# Patient Record
Sex: Female | Born: 1935 | Hispanic: No | Smoking: Never smoker
Health system: Southern US, Community
[De-identification: ages and names within clinical notes are randomized; demographics above are authoritative.]

## PROBLEM LIST (undated history)

## (undated) DIAGNOSIS — I6529 Occlusion and stenosis of unspecified carotid artery: Secondary | ICD-10-CM

## (undated) DIAGNOSIS — I480 Paroxysmal atrial fibrillation: Secondary | ICD-10-CM

## (undated) DIAGNOSIS — I499 Cardiac arrhythmia, unspecified: Secondary | ICD-10-CM

## (undated) DIAGNOSIS — K219 Gastro-esophageal reflux disease without esophagitis: Secondary | ICD-10-CM

## (undated) DIAGNOSIS — R51 Headache: Secondary | ICD-10-CM

## (undated) DIAGNOSIS — I10 Essential (primary) hypertension: Secondary | ICD-10-CM

## (undated) DIAGNOSIS — R519 Headache, unspecified: Secondary | ICD-10-CM

## (undated) DIAGNOSIS — M1990 Unspecified osteoarthritis, unspecified site: Secondary | ICD-10-CM

## (undated) HISTORY — PX: INNER EAR SURGERY: SHX679

## (undated) HISTORY — DX: Occlusion and stenosis of unspecified carotid artery: I65.29

## (undated) HISTORY — PX: NASAL SINUS SURGERY: SHX719

## (undated) HISTORY — PX: ABDOMINAL HYSTERECTOMY: SHX81

## (undated) HISTORY — PX: KNEE ARTHROPLASTY: SHX992

## (undated) HISTORY — PX: CAROTID ENDARTERECTOMY: SUR193

## (undated) HISTORY — PX: BACK SURGERY: SHX140

## (undated) HISTORY — PX: TONSILLECTOMY: SUR1361

## (undated) HISTORY — PX: APPENDECTOMY: SHX54

---

## 1999-02-22 ENCOUNTER — Other Ambulatory Visit: Admission: RE | Admit: 1999-02-22 | Discharge: 1999-02-22 | Payer: Self-pay | Admitting: Obstetrics & Gynecology

## 2004-05-24 ENCOUNTER — Other Ambulatory Visit: Admission: RE | Admit: 2004-05-24 | Discharge: 2004-05-24 | Payer: Self-pay | Admitting: Obstetrics & Gynecology

## 2013-10-29 ENCOUNTER — Other Ambulatory Visit (HOSPITAL_COMMUNITY): Payer: Self-pay | Admitting: Orthopedic Surgery

## 2013-10-29 DIAGNOSIS — M4800 Spinal stenosis, site unspecified: Secondary | ICD-10-CM

## 2013-11-02 ENCOUNTER — Ambulatory Visit (HOSPITAL_COMMUNITY)
Admission: RE | Admit: 2013-11-02 | Discharge: 2013-11-02 | Disposition: A | Discharge status: Home / Self Care | Payer: Medicare Other | Source: Ambulatory Visit | Attending: Orthopedic Surgery | Admitting: Orthopedic Surgery

## 2013-11-02 DIAGNOSIS — M4120 Other idiopathic scoliosis, site unspecified: Secondary | ICD-10-CM | POA: Diagnosis not present

## 2013-11-02 DIAGNOSIS — M545 Low back pain, unspecified: Secondary | ICD-10-CM | POA: Diagnosis present

## 2013-11-02 DIAGNOSIS — M47817 Spondylosis without myelopathy or radiculopathy, lumbosacral region: Secondary | ICD-10-CM | POA: Insufficient documentation

## 2013-11-02 DIAGNOSIS — M51379 Other intervertebral disc degeneration, lumbosacral region without mention of lumbar back pain or lower extremity pain: Secondary | ICD-10-CM | POA: Insufficient documentation

## 2013-11-02 DIAGNOSIS — M5137 Other intervertebral disc degeneration, lumbosacral region: Secondary | ICD-10-CM | POA: Diagnosis not present

## 2013-11-02 DIAGNOSIS — M5124 Other intervertebral disc displacement, thoracic region: Secondary | ICD-10-CM | POA: Diagnosis not present

## 2013-11-02 DIAGNOSIS — M4800 Spinal stenosis, site unspecified: Secondary | ICD-10-CM

## 2013-11-02 DIAGNOSIS — M7130 Other bursal cyst, unspecified site: Secondary | ICD-10-CM | POA: Diagnosis not present

## 2015-07-31 DIAGNOSIS — M7062 Trochanteric bursitis, left hip: Secondary | ICD-10-CM | POA: Diagnosis not present

## 2015-08-31 DIAGNOSIS — M1711 Unilateral primary osteoarthritis, right knee: Secondary | ICD-10-CM | POA: Diagnosis not present

## 2015-09-04 DIAGNOSIS — I10 Essential (primary) hypertension: Secondary | ICD-10-CM | POA: Diagnosis not present

## 2015-09-07 DIAGNOSIS — H2513 Age-related nuclear cataract, bilateral: Secondary | ICD-10-CM | POA: Diagnosis not present

## 2015-09-07 DIAGNOSIS — H538 Other visual disturbances: Secondary | ICD-10-CM | POA: Diagnosis not present

## 2015-09-07 DIAGNOSIS — H04123 Dry eye syndrome of bilateral lacrimal glands: Secondary | ICD-10-CM | POA: Diagnosis not present

## 2015-09-13 DIAGNOSIS — J069 Acute upper respiratory infection, unspecified: Secondary | ICD-10-CM | POA: Diagnosis not present

## 2015-09-13 DIAGNOSIS — M545 Low back pain: Secondary | ICD-10-CM | POA: Diagnosis not present

## 2015-09-13 DIAGNOSIS — Z6824 Body mass index (BMI) 24.0-24.9, adult: Secondary | ICD-10-CM | POA: Diagnosis not present

## 2015-09-13 DIAGNOSIS — I10 Essential (primary) hypertension: Secondary | ICD-10-CM | POA: Diagnosis not present

## 2015-09-13 DIAGNOSIS — Z789 Other specified health status: Secondary | ICD-10-CM | POA: Diagnosis not present

## 2015-09-19 DIAGNOSIS — I10 Essential (primary) hypertension: Secondary | ICD-10-CM | POA: Diagnosis not present

## 2015-09-19 DIAGNOSIS — E7800 Pure hypercholesterolemia, unspecified: Secondary | ICD-10-CM | POA: Diagnosis not present

## 2015-10-24 DIAGNOSIS — M7062 Trochanteric bursitis, left hip: Secondary | ICD-10-CM | POA: Diagnosis not present

## 2015-10-24 DIAGNOSIS — M47812 Spondylosis without myelopathy or radiculopathy, cervical region: Secondary | ICD-10-CM | POA: Diagnosis not present

## 2015-10-24 DIAGNOSIS — M25512 Pain in left shoulder: Secondary | ICD-10-CM | POA: Diagnosis not present

## 2015-10-24 DIAGNOSIS — M542 Cervicalgia: Secondary | ICD-10-CM | POA: Diagnosis not present

## 2015-10-31 DIAGNOSIS — Z1231 Encounter for screening mammogram for malignant neoplasm of breast: Secondary | ICD-10-CM | POA: Diagnosis not present

## 2015-11-16 DIAGNOSIS — Z299 Encounter for prophylactic measures, unspecified: Secondary | ICD-10-CM | POA: Diagnosis not present

## 2015-11-16 DIAGNOSIS — I10 Essential (primary) hypertension: Secondary | ICD-10-CM | POA: Diagnosis not present

## 2015-12-20 DIAGNOSIS — I868 Varicose veins of other specified sites: Secondary | ICD-10-CM | POA: Diagnosis not present

## 2016-01-03 DIAGNOSIS — M47812 Spondylosis without myelopathy or radiculopathy, cervical region: Secondary | ICD-10-CM | POA: Diagnosis not present

## 2016-01-19 DIAGNOSIS — M7062 Trochanteric bursitis, left hip: Secondary | ICD-10-CM | POA: Diagnosis not present

## 2016-03-01 DIAGNOSIS — Z23 Encounter for immunization: Secondary | ICD-10-CM | POA: Diagnosis not present

## 2016-03-22 DIAGNOSIS — E663 Overweight: Secondary | ICD-10-CM | POA: Diagnosis not present

## 2016-03-22 DIAGNOSIS — R5383 Other fatigue: Secondary | ICD-10-CM | POA: Diagnosis not present

## 2016-03-22 DIAGNOSIS — Z0000 Encounter for general adult medical examination without abnormal findings: Secondary | ICD-10-CM | POA: Diagnosis not present

## 2016-03-22 DIAGNOSIS — Z299 Encounter for prophylactic measures, unspecified: Secondary | ICD-10-CM | POA: Diagnosis not present

## 2016-03-22 DIAGNOSIS — Z1389 Encounter for screening for other disorder: Secondary | ICD-10-CM | POA: Diagnosis not present

## 2016-03-22 DIAGNOSIS — Z79899 Other long term (current) drug therapy: Secondary | ICD-10-CM | POA: Diagnosis not present

## 2016-03-22 DIAGNOSIS — Z7189 Other specified counseling: Secondary | ICD-10-CM | POA: Diagnosis not present

## 2016-03-25 ENCOUNTER — Ambulatory Visit (INDEPENDENT_AMBULATORY_CARE_PROVIDER_SITE_OTHER): Payer: Medicare Other | Admitting: Physical Medicine and Rehabilitation

## 2016-03-25 DIAGNOSIS — M545 Low back pain: Secondary | ICD-10-CM

## 2016-07-02 DIAGNOSIS — I10 Essential (primary) hypertension: Secondary | ICD-10-CM | POA: Diagnosis not present

## 2016-07-02 DIAGNOSIS — Z713 Dietary counseling and surveillance: Secondary | ICD-10-CM | POA: Diagnosis not present

## 2016-07-02 DIAGNOSIS — Z789 Other specified health status: Secondary | ICD-10-CM | POA: Diagnosis not present

## 2016-07-02 DIAGNOSIS — G43909 Migraine, unspecified, not intractable, without status migrainosus: Secondary | ICD-10-CM | POA: Diagnosis not present

## 2016-07-02 DIAGNOSIS — Z6826 Body mass index (BMI) 26.0-26.9, adult: Secondary | ICD-10-CM | POA: Diagnosis not present

## 2016-07-02 DIAGNOSIS — Z299 Encounter for prophylactic measures, unspecified: Secondary | ICD-10-CM | POA: Diagnosis not present

## 2016-07-08 DIAGNOSIS — D235 Other benign neoplasm of skin of trunk: Secondary | ICD-10-CM | POA: Diagnosis not present

## 2016-07-09 DIAGNOSIS — L308 Other specified dermatitis: Secondary | ICD-10-CM | POA: Diagnosis not present

## 2016-07-09 DIAGNOSIS — D225 Melanocytic nevi of trunk: Secondary | ICD-10-CM | POA: Diagnosis not present

## 2016-07-11 ENCOUNTER — Ambulatory Visit (INDEPENDENT_AMBULATORY_CARE_PROVIDER_SITE_OTHER): Payer: BC Managed Care – PPO | Admitting: Orthopedic Surgery

## 2016-07-13 ENCOUNTER — Encounter (INDEPENDENT_AMBULATORY_CARE_PROVIDER_SITE_OTHER): Payer: Self-pay | Admitting: Orthopedic Surgery

## 2016-07-13 ENCOUNTER — Ambulatory Visit (INDEPENDENT_AMBULATORY_CARE_PROVIDER_SITE_OTHER): Payer: Medicare Other | Admitting: Orthopedic Surgery

## 2016-07-13 ENCOUNTER — Ambulatory Visit (INDEPENDENT_AMBULATORY_CARE_PROVIDER_SITE_OTHER): Payer: Medicare Other

## 2016-07-13 DIAGNOSIS — M25561 Pain in right knee: Secondary | ICD-10-CM

## 2016-07-13 DIAGNOSIS — G8929 Other chronic pain: Secondary | ICD-10-CM

## 2016-07-13 DIAGNOSIS — M1711 Unilateral primary osteoarthritis, right knee: Secondary | ICD-10-CM | POA: Insufficient documentation

## 2016-07-13 MED ORDER — METHYLPREDNISOLONE ACETATE 40 MG/ML IJ SUSP
40.0000 mg | INTRAMUSCULAR | Status: AC | PRN
  Administered 2016-07-13: 40 mg via INTRA_ARTICULAR

## 2016-07-13 MED ORDER — LIDOCAINE HCL 1 % IJ SOLN
5.0000 mL | INTRAMUSCULAR | Status: AC | PRN
  Administered 2016-07-13: 5 mL

## 2016-07-13 NOTE — Progress Notes (Addendum)
Office Visit Note   Patient: Lydia Graham           Date of Birth: [birth year]           MRN: [id number] Visit Date: 07/13/2016              Requested by: Glenda Chroman, MD [address] [place], [place] [address] PCP: Glenda Chroman, MD  Chief Complaint  Patient presents with  . Right Knee - Pain    [id number] has chronic osteophyte arthritis involving upper and lower extremities worse in the right knee unable to perform activities of daily living due to pain. Injection last year did not provide much relief. HPI  Assessment & Plan: Visit Diagnoses:  1. Unilateral primary osteoarthritis, right knee   2. Chronic pain of right knee     Plan: Right knee injected through the anteromedial portal without complications. Patient will follow-up as needed. Discussed the risks and benefits of total knee arthroplasty patient will require discharge to skilled nursing postoperatively for several weeks.  Follow-Up Instructions: Return if symptoms worsen or fail to improve.   Ortho Exam Examination patient is alert oriented no adenopathy well-dressed normal affect normal rest where for she does have an antalgic gait she has valgus alignment to the right knee. She has pain to palpation or lateral joint line close a cruciate are stable she is crepitation with range of motion. Radiographs shows tricompartmental osteoarthritis of the right knee.  Imaging: Xr Knee 1-2 Views Right  Result Date: 07/13/2016 Two-view radiographs of the right knee shows bone-on-bone contact with lateral joint line osteophytic bone spurs as well as patellofemoral bone spurs subcondylar sclerosis the lateral joint line with increased valgus alignment of the right knee.   Orders:  Orders Placed This Encounter  Procedures  . Large Joint Injection/Arthrocentesis  . XR Knee 1-2 Views Right   Meds ordered this encounter  Medications  . lidocaine (XYLOCAINE) 1 % (with pres) injection 5 mL  . methylPREDNISolone acetate  (DEPO-MEDROL) injection 40 mg     Procedures: Large Joint Inj Date/Time: 07/13/2016 1:04 PM Performed by: DUDA, MARCUS V Authorized by: [name]   Consent Given by:  Patient Site marked: the procedure site was marked   Timeout: prior to procedure the correct patient, procedure, and site was verified   Indications:  Pain and diagnostic evaluation Location:  Knee Site:  R knee Prep: patient was prepped and draped in usual sterile fashion   Needle Size:  22 G Needle Length:  1.5 inches Ultrasound Guidance: No   Fluoroscopic Guidance: No   Arthrogram: No   Medications:  5 mL lidocaine 1 %; 40 mg methylPREDNISolone acetate 40 MG/ML Aspiration Attempted: No   Patient tolerance:  Patient tolerated the procedure well with no immediate complications    Clinical Data: No additional findings.  Subjective: Review of Systems  Objective: Vital Signs: There were no vitals taken for this visit.  Specialty Comments:  No specialty comments available.  PMFS History: Patient Active Problem List   Diagnosis Date Noted  . Unilateral primary osteoarthritis, right knee 07/13/2016   No past medical history on file.  No family history on file.  No past surgical history on file. Social History   Occupational History  . Not on file.   Social History Main Topics  . Smoking status: Never Smoker  . Smokeless tobacco: Never Used  . Alcohol use Not on file  . Drug use: Unknown  . Sexual activity: Not on  file

## 2016-07-17 ENCOUNTER — Telehealth (INDEPENDENT_AMBULATORY_CARE_PROVIDER_SITE_OTHER): Payer: Self-pay | Admitting: Orthopedic Surgery

## 2016-07-18 NOTE — Telephone Encounter (Signed)
Patient left a message requesting an appointment with Dr. Sharol Given. Routed message to phones/ front desk to call and schedule the patient.

## 2016-09-04 ENCOUNTER — Ambulatory Visit (INDEPENDENT_AMBULATORY_CARE_PROVIDER_SITE_OTHER): Payer: BC Managed Care – PPO | Admitting: Orthopedic Surgery

## 2016-09-10 ENCOUNTER — Ambulatory Visit (INDEPENDENT_AMBULATORY_CARE_PROVIDER_SITE_OTHER): Payer: Medicare Other | Admitting: Orthopedic Surgery

## 2016-09-10 ENCOUNTER — Telehealth (INDEPENDENT_AMBULATORY_CARE_PROVIDER_SITE_OTHER): Payer: Self-pay | Admitting: Physical Medicine and Rehabilitation

## 2016-09-10 ENCOUNTER — Encounter (INDEPENDENT_AMBULATORY_CARE_PROVIDER_SITE_OTHER): Payer: Self-pay | Admitting: Orthopedic Surgery

## 2016-09-10 ENCOUNTER — Encounter (INDEPENDENT_AMBULATORY_CARE_PROVIDER_SITE_OTHER): Payer: Self-pay

## 2016-09-10 VITALS — Ht 67.0 in | Wt 150.0 lb

## 2016-09-10 DIAGNOSIS — M1711 Unilateral primary osteoarthritis, right knee: Secondary | ICD-10-CM | POA: Diagnosis not present

## 2016-09-10 NOTE — Telephone Encounter (Signed)
If she feels vary same symptoms etc no new trauma and last was more than 50%

## 2016-09-10 NOTE — Progress Notes (Signed)
   Office Visit Note   Patient: Lydia Graham           Date of Birth: Oct 18, 1935           MRN: [id number] Visit Date: 09/10/2016              Requested by: Glenda Chroman, MD [address] [place], [place] [address] PCP: Glenda Chroman, MD  Chief Complaint  Patient presents with  . Right Knee - Pain    s/p injection 07/13/16    [id number] complains of right knee pain which is now not relieved with injections. She is undergone prolonged conservative therapy she has pain with activities of daily living pain at night pain that prevents her from performing her normal activities. HPI  Assessment & Plan: Visit Diagnoses:  1. Unilateral primary osteoarthritis, right knee     Plan: Patient states she would proceed with a total knee arthroplasty. Risk and benefits were discussed including infection neurovascular injury persistent pain and need for additional surgery. Patient states she understands wish to proceed at this time she would like to proceed towards the end of April beginning of May when her sister who is a Buyer, retail could be available to assist her at home.  Follow-Up Instructions: Return if symptoms worsen or fail to improve.   Ortho Exam Examination patient is alert oriented no adenopathy well-dressed normal affect normal respiratory effort she does have an antalgic gait she has valgus alignment to the right knee causing presents are stable she is tender to palpation over the medial joint line as well as patellofemoral joint there is no effusion. Range of motion 0-120. ROS: Review of systems updated all other review of systems negative. Imaging: No results found.  Labs: No results found for: HGBA1C, ESRSEDRATE, CRP, LABURIC, REPTSTATUS, GRAMSTAIN, CULT, LABORGA  Orders:  No orders of the defined types were placed in this encounter.  No orders of the defined types were placed in this encounter.    Procedures: No procedures performed  Clinical Data: No  additional findings.  Subjective: Review of Systems  Objective: Vital Signs: Ht 5\' 7"  (1.702 m)   Wt 150 lb (68 kg)   BMI 23.49 kg/m   Specialty Comments:  No specialty comments available.  PMFS History: Patient Active Problem List   Diagnosis Date Noted  . Unilateral primary osteoarthritis, right knee 07/13/2016   No past medical history on file.  No family history on file.  No past surgical history on file. Social History   Occupational History  . Not on file.   Social History Main Topics  . Smoking status: Never Smoker  . Smokeless tobacco: Never Used  . Alcohol use Not on file  . Drug use: Unknown  . Sexual activity: Not on file

## 2016-09-10 NOTE — Telephone Encounter (Signed)
Called and left message for patient to call for scheduling.

## 2016-09-11 DIAGNOSIS — E663 Overweight: Secondary | ICD-10-CM | POA: Diagnosis not present

## 2016-09-11 DIAGNOSIS — M5440 Lumbago with sciatica, unspecified side: Secondary | ICD-10-CM | POA: Diagnosis not present

## 2016-09-11 DIAGNOSIS — Z713 Dietary counseling and surveillance: Secondary | ICD-10-CM | POA: Diagnosis not present

## 2016-09-11 DIAGNOSIS — Z299 Encounter for prophylactic measures, unspecified: Secondary | ICD-10-CM | POA: Diagnosis not present

## 2016-09-11 DIAGNOSIS — M545 Low back pain: Secondary | ICD-10-CM | POA: Diagnosis not present

## 2016-09-11 NOTE — Telephone Encounter (Signed)
I called and left another message for the patient to call for scheduling.

## 2016-09-11 NOTE — Telephone Encounter (Signed)
The patient called back and said she saw her regular doctor yesterday who put her on oral prednisone and "gave her a shot in the sciatic nerve." She said she is still hurting, and the injection she had here in October helped. Please advise.

## 2016-09-11 NOTE — Telephone Encounter (Signed)
We need to wait 10 days after the "sciatic nerve" injection but can repeat last

## 2016-09-12 NOTE — Telephone Encounter (Signed)
Scheduled for 09/25/16 at 0830.

## 2016-09-25 ENCOUNTER — Ambulatory Visit (INDEPENDENT_AMBULATORY_CARE_PROVIDER_SITE_OTHER): Payer: Medicare Other

## 2016-09-25 ENCOUNTER — Ambulatory Visit (INDEPENDENT_AMBULATORY_CARE_PROVIDER_SITE_OTHER): Payer: Medicare Other | Admitting: Physical Medicine and Rehabilitation

## 2016-09-25 ENCOUNTER — Encounter (INDEPENDENT_AMBULATORY_CARE_PROVIDER_SITE_OTHER): Payer: Self-pay

## 2016-09-25 ENCOUNTER — Other Ambulatory Visit (INDEPENDENT_AMBULATORY_CARE_PROVIDER_SITE_OTHER): Payer: Self-pay | Admitting: Family

## 2016-09-25 ENCOUNTER — Encounter (INDEPENDENT_AMBULATORY_CARE_PROVIDER_SITE_OTHER): Payer: Self-pay | Admitting: Physical Medicine and Rehabilitation

## 2016-09-25 VITALS — BP 136/73 | HR 61 | Temp 98.0°F

## 2016-09-25 DIAGNOSIS — G8929 Other chronic pain: Secondary | ICD-10-CM

## 2016-09-25 DIAGNOSIS — M5442 Lumbago with sciatica, left side: Secondary | ICD-10-CM

## 2016-09-25 DIAGNOSIS — M5416 Radiculopathy, lumbar region: Secondary | ICD-10-CM

## 2016-09-25 DIAGNOSIS — M7062 Trochanteric bursitis, left hip: Secondary | ICD-10-CM

## 2016-09-25 DIAGNOSIS — M25552 Pain in left hip: Secondary | ICD-10-CM

## 2016-09-25 NOTE — Progress Notes (Signed)
Lydia Graham - 81 y.o. female MRN [id number]  Date of birth: 06/24/1936  Office Visit Note: Visit Date: 09/25/2016 PCP: Glenda Chroman, MD Referred by: Glenda Chroman, MD  Subjective: Chief Complaint  Patient presents with  . Lower Back - Pain   HPI: Lydia Graham is a very pleasant 81 year old female that I have seen off and on for several years. She has a lumbar MRI showing levoconvex lumbar scoliosis with listhesis of L3 on L4 and retrolisthesis of L4 on L5 but mild areas of possible impingement at L3-for L4-5 both left and right. No frank nerve compression noted on imaging. She comes in today complaining of chronic worsening lower back pain. Pain was mostly on the left side two weeks ago, now having a little pain on right side. Has to lie flat for relief. Riding in car causes buttock pain. Had an injection for this at PCP's office recently, also took prednisone. This has relieved the pain some, and she is no longer having pain in the leg. Pain level now is at a 3 or 4. Pain was "off the chart" before seeing PCP. She evidently got a shot of steroid medication and may be Toradol and her primary care physician's office. Her main complaint today really is pain over the left greater trochanter and not being able to lay on that side. Her back pain is fairly mild at this point. She is also going to have a knee replacement performed in the early part of May.     ROS Otherwise per HPI.  Assessment & Plan: Visit Diagnoses:  1. Pain in left hip   2. Greater trochanteric bursitis, left   3. Lumbar radiculopathy   4. Chronic bilateral low back pain with left-sided sciatica     Plan: Findings:  Diagnostic and therapeutic left greater trochanteric bursa injection with fluoroscopic guidance due to body habitus. If she gets no relief from this we would try to maybe look at epidural injection before her surgery. Dr. Sharol Given did not want her to have any steroid injections 2 weeks prior to that surgery.    Meds & Orders: No orders of the defined types were placed in this encounter.   Orders Placed This Encounter  Procedures  . Large Joint Injection/Arthrocentesis  . XR C-ARM NO REPORT    Follow-up: Return if symptoms worsen or fail to improve.   Procedures: Date/Time: 09/25/2016 8:47 AM Performed by: Magnus Sinning Authorized by: Magnus Sinning   Consent Given by:  Parent Site marked: the procedure site was marked   Timeout: prior to procedure the correct patient, procedure, and site was verified   Indications:  Pain and diagnostic evaluation Location:  Hip Site:  L greater trochanter Prep: patient was prepped and draped in usual sterile fashion   Needle Size:  22 G Needle Length:  3.5 inches Approach:  Lateral Ultrasound Guidance: No   Fluoroscopic Guidance: Yes   Arthrogram: No   Medications:  80 mg triamcinolone acetonide 40 MG/ML; 9 mL bupivacaine 0.5 % Aspiration Attempted: No   Patient tolerance:  Patient tolerated the procedure well with no immediate complications  There was excellent flow of contrast outlined the greater trochanteric bursa without vascular uptake.    No notes on file   Clinical History: FINDINGS: There is 13 degrees of levoconvex lumbar scoliosis between L5 and L2. Lipoma of the a left lateral abdominal wall musculature incidentally noted.  The lowest lumbar type non-rib-bearing vertebra is labeled as L5. The conus medullaris  appears normal. Conus level: L1-2. Suspected postoperative findings on the right at L3-4.  There is disc desiccation throughout the lumbar spine. Type 1 degenerative endplate findings at V8-9 with type 2 degenerative endplate findings at F8-1.  There is 3 mm of grade 1 anterolisthesis at L3-4 and 3 mm retrolisthesis at L4-5.  The leftward scoliosis has a significant rotary component.  Small central disc protrusion at T12-L1 without impingement. Additional findings at individual levels are as  follows:  L1-2: No impingement. Left lateral recess and inferior foraminal disc protrusion.  L2-3: Mild displacement of the left L2 nerve in the lateral extraforaminal space due to left lateral extraforaminal disc protrusion.  L3-4: Moderate right foraminal stenosis and mild right subarticular lateral recess stenosis due to disc uncovering, right lateral recess and foraminal disc protrusion, and facet arthropathy. There is also a left paracentral disc protrusion.  L4-5: Mild left foraminal stenosis along with mild left and borderline right subarticular lateral recess stenosis due to disc bulge and facet and intervertebral spurring.  L5-S1: No impingement. Small bilateral synovial cysts are best observed on the axial images.  IMPRESSION: 1. Lumbar spondylosis and degenerative disc disease, causing moderate impingement at L3-4 and mild impingement at L2-3 and L4-5, as detailed above. 2. Levoconvex lumbar scoliosis with rotary component.   Electronically Signed   By: Sherryl Barters M.D.   On: 11/02/2013 11:29  She reports that she has never smoked. She has never used smokeless tobacco. No results for input(s): HGBA1C, LABURIC in the last 8760 hours.  Objective:  VS:  HT:    WT:   BMI:     BP:136/73  HR:61bpm  TEMP:98 F (36.7 C)(Oral)  RESP:99 % Physical Exam  Musculoskeletal:  Patient has concordant low back pain with extension rotation she has no pain with hip rotation except at the end range of external rotation on the left. She has pain over the left greater trochanter that reproduces most of her hip pain.    Ortho Exam Imaging: Xr C-arm No Report  Result Date: 09/25/2016 Please see Notes or Procedures tab for imaging impression.   Past Medical/Family/Surgical/Social History: Medications & Allergies reviewed per EMR Patient Active Problem List   Diagnosis Date Noted  . Unilateral primary osteoarthritis, right knee 07/13/2016   History reviewed. No  pertinent past medical history. History reviewed. No pertinent family history. History reviewed. No pertinent surgical history. Social History   Occupational History  . Not on file.   Social History Main Topics  . Smoking status: Never Smoker  . Smokeless tobacco: Never Used  . Alcohol use Not on file  . Drug use: Unknown  . Sexual activity: Not on file

## 2016-09-25 NOTE — Patient Instructions (Signed)

## 2016-09-26 MED ORDER — BUPIVACAINE HCL 0.5 % IJ SOLN
9.0000 mL | INTRAMUSCULAR | Status: AC | PRN
  Administered 2016-09-25: 9 mL via INTRA_ARTICULAR

## 2016-09-26 MED ORDER — TRIAMCINOLONE ACETONIDE 40 MG/ML IJ SUSP
80.0000 mg | INTRAMUSCULAR | Status: AC | PRN
  Administered 2016-09-25: 80 mg via INTRA_ARTICULAR

## 2016-10-07 ENCOUNTER — Other Ambulatory Visit (INDEPENDENT_AMBULATORY_CARE_PROVIDER_SITE_OTHER): Payer: Self-pay | Admitting: Orthopedic Surgery

## 2016-10-14 ENCOUNTER — Encounter (HOSPITAL_COMMUNITY)
Admission: RE | Admit: 2016-10-14 | Discharge: 2016-10-14 | Disposition: A | Discharge status: Home / Self Care | Payer: Medicare Other | Source: Ambulatory Visit | Attending: Orthopedic Surgery | Admitting: Orthopedic Surgery

## 2016-10-14 ENCOUNTER — Encounter (HOSPITAL_COMMUNITY): Payer: Self-pay | Admitting: *Deleted

## 2016-10-14 DIAGNOSIS — M1711 Unilateral primary osteoarthritis, right knee: Secondary | ICD-10-CM | POA: Insufficient documentation

## 2016-10-14 DIAGNOSIS — Z01812 Encounter for preprocedural laboratory examination: Secondary | ICD-10-CM | POA: Insufficient documentation

## 2016-10-14 DIAGNOSIS — Z01810 Encounter for preprocedural cardiovascular examination: Secondary | ICD-10-CM | POA: Insufficient documentation

## 2016-10-14 DIAGNOSIS — I10 Essential (primary) hypertension: Secondary | ICD-10-CM | POA: Diagnosis not present

## 2016-10-14 HISTORY — DX: Headache, unspecified: R51.9

## 2016-10-14 HISTORY — DX: Gastro-esophageal reflux disease without esophagitis: K21.9

## 2016-10-14 HISTORY — DX: Essential (primary) hypertension: I10

## 2016-10-14 HISTORY — DX: Unspecified osteoarthritis, unspecified site: M19.90

## 2016-10-14 HISTORY — DX: Headache: R51

## 2016-10-14 LAB — BASIC METABOLIC PANEL
ANION GAP: 7 (ref 5–15)
BUN: 14 mg/dL (ref 6–20)
CALCIUM: 9.2 mg/dL (ref 8.9–10.3)
CO2: 26 mmol/L (ref 22–32)
Chloride: 94 mmol/L — ABNORMAL LOW (ref 101–111)
Creatinine, Ser: 0.98 mg/dL (ref 0.44–1.00)
GFR, EST NON AFRICAN AMERICAN: 54 mL/min — AB (ref >60)
Glucose, Bld: 97 mg/dL (ref 65–99)
Potassium: 4.3 mmol/L (ref 3.5–5.1)
Sodium: 127 mmol/L — ABNORMAL LOW (ref 135–145)

## 2016-10-14 LAB — CBC
HEMATOCRIT: 40.1 % (ref 36.0–46.0)
Hemoglobin: 13.6 g/dL (ref 12.0–15.0)
MCH: 29.8 pg (ref 26.0–34.0)
MCHC: 33.9 g/dL (ref 30.0–36.0)
MCV: 87.9 fL (ref 78.0–100.0)
Platelets: 219 10*3/uL (ref 150–400)
RBC: 4.56 MIL/uL (ref 3.87–5.11)
RDW: 12.8 % (ref 11.5–15.5)
WBC: 7.1 10*3/uL (ref 4.0–10.5)

## 2016-10-14 LAB — SURGICAL PCR SCREEN
MRSA, PCR: NEGATIVE
Staphylococcus aureus: NEGATIVE

## 2016-10-14 NOTE — Pre-Procedure Instructions (Signed)
    [place]  10/14/2016      [facility] - [place], [place], [place] [address] [place] [address] Phone: [phone] Fax: [phone]    Your procedure is scheduled on 10/23/16.  Report to [facility] Admitting at 630 A.M.  Call this number if you have problems the morning of surgery:  [phone]   Remember:  Do not eat food or drink liquids after midnight.  Take these medicines the morning of surgery with A SIP OF WATER --norvasc,carvedilol,estrace,protonix   Do not wear jewelry, make-up or nail polish.  Do not wear lotions, powders, or perfumes, or deoderant.  Do not shave 48 hours prior to surgery.  Men may shave face and neck.  Do not bring valuables to the hospital.  [facility] is not responsible for any belongings or valuables.  Contacts, dentures or bridgework may not be worn into surgery.  Leave your suitcase in the car.  After surgery it may be brought to your room.  For patients admitted to the hospital, discharge time will be determined by your treatment team.  Patients discharged the day of surgery will not be allowed to drive home.   Name and phone number of your driver:   Special instructions:  Do not take any aspirin,anti-inflammatories,vitamins,or herbal supplements 5-7 days prior to surgery.  Please read over the following fact sheets that you were given. MRSA Information

## 2016-10-22 MED ORDER — CEFAZOLIN SODIUM-DEXTROSE 2-4 GM/100ML-% IV SOLN
2.0000 g | INTRAVENOUS | Status: AC
  Administered 2016-10-23: 2 g via INTRAVENOUS
  Filled 2016-10-22: qty 100

## 2016-10-22 NOTE — Anesthesia Preprocedure Evaluation (Addendum)
Anesthesia Evaluation  Patient identified by MRN, date of birth, ID band Patient awake    Reviewed: Allergy & Precautions, NPO status , Patient's Chart, lab work & pertinent test results, reviewed documented beta blocker date and time   History of Anesthesia Complications Negative for: history of anesthetic complications  Airway Mallampati: II  TM Distance: >3 FB Neck ROM: Full    Dental no notable dental hx. (+) Dental Advisory Given, Teeth Intact   Pulmonary neg pulmonary ROS,    Pulmonary exam normal        Cardiovascular hypertension, Pt. on home beta blockers and Pt. on medications negative cardio ROS Normal cardiovascular exam     Neuro/Psych negative neurological ROS     GI/Hepatic Neg liver ROS, GERD  Medicated and Controlled,  Endo/Other  negative endocrine ROS  Renal/GU negative Renal ROS     Musculoskeletal  (+) Arthritis , Osteoarthritis,    Abdominal   Peds  Hematology negative hematology ROS (+)   Anesthesia Other Findings Day of surgery medications reviewed with the patient.  Reproductive/Obstetrics                           Anesthesia Physical Anesthesia Plan  ASA: II  Anesthesia Plan: MAC and Spinal   Post-op Pain Management: GA combined w/ Regional for post-op pain   Induction:   Airway Management Planned: Natural Airway and Simple Face Mask  Additional Equipment:   Intra-op Plan:   Post-operative Plan:   Informed Consent: I have reviewed the patients History and Physical, chart, labs and discussed the procedure including the risks, benefits and alternatives for the proposed anesthesia with the patient or authorized representative who has indicated his/her understanding and acceptance.   Dental advisory given  Plan Discussed with: Anesthesiologist, CRNA and Surgeon  Anesthesia Plan Comments:       Anesthesia Quick Evaluation

## 2016-10-23 ENCOUNTER — Inpatient Hospital Stay (HOSPITAL_COMMUNITY): Payer: Medicare Other | Admitting: Anesthesiology

## 2016-10-23 ENCOUNTER — Encounter (HOSPITAL_COMMUNITY): Payer: Self-pay | Admitting: Certified Registered Nurse Anesthetist

## 2016-10-23 ENCOUNTER — Encounter (HOSPITAL_COMMUNITY)
Admission: RE | Disposition: A | Discharge status: Home Health | Payer: Self-pay | Source: Ambulatory Visit | Attending: Orthopedic Surgery

## 2016-10-23 ENCOUNTER — Inpatient Hospital Stay (HOSPITAL_COMMUNITY)
Admission: RE | Admit: 2016-10-23 | Discharge: 2016-10-25 | DRG: 470 | Disposition: A | Discharge status: Home Health | Payer: Medicare Other | Source: Ambulatory Visit | Attending: Orthopedic Surgery | Admitting: Orthopedic Surgery

## 2016-10-23 DIAGNOSIS — Z79890 Hormone replacement therapy (postmenopausal): Secondary | ICD-10-CM | POA: Diagnosis not present

## 2016-10-23 DIAGNOSIS — R51 Headache: Secondary | ICD-10-CM | POA: Diagnosis present

## 2016-10-23 DIAGNOSIS — Z96651 Presence of right artificial knee joint: Secondary | ICD-10-CM

## 2016-10-23 DIAGNOSIS — M1711 Unilateral primary osteoarthritis, right knee: Secondary | ICD-10-CM | POA: Diagnosis not present

## 2016-10-23 DIAGNOSIS — Z791 Long term (current) use of non-steroidal anti-inflammatories (NSAID): Secondary | ICD-10-CM | POA: Diagnosis not present

## 2016-10-23 DIAGNOSIS — Z79899 Other long term (current) drug therapy: Secondary | ICD-10-CM

## 2016-10-23 DIAGNOSIS — Z888 Allergy status to other drugs, medicaments and biological substances status: Secondary | ICD-10-CM

## 2016-10-23 DIAGNOSIS — G8918 Other acute postprocedural pain: Secondary | ICD-10-CM | POA: Diagnosis not present

## 2016-10-23 DIAGNOSIS — K219 Gastro-esophageal reflux disease without esophagitis: Secondary | ICD-10-CM | POA: Diagnosis present

## 2016-10-23 DIAGNOSIS — I10 Essential (primary) hypertension: Secondary | ICD-10-CM | POA: Diagnosis present

## 2016-10-23 HISTORY — PX: TOTAL KNEE ARTHROPLASTY: SHX125

## 2016-10-23 SURGERY — ARTHROPLASTY, KNEE, TOTAL
Anesthesia: Monitor Anesthesia Care | Laterality: Right

## 2016-10-23 MED ORDER — TRANEXAMIC ACID 1000 MG/10ML IV SOLN
1000.0000 mg | INTRAVENOUS | Status: AC
  Administered 2016-10-23: 1000 mg via INTRAVENOUS
  Filled 2016-10-23: qty 10

## 2016-10-23 MED ORDER — MIDAZOLAM HCL 2 MG/2ML IJ SOLN
INTRAMUSCULAR | Status: AC
  Filled 2016-10-23: qty 2

## 2016-10-23 MED ORDER — LOSARTAN POTASSIUM 50 MG PO TABS
100.0000 mg | ORAL_TABLET | Freq: Every day | ORAL | Status: DC
  Administered 2016-10-23 – 2016-10-25 (×3): 100 mg via ORAL
  Filled 2016-10-23 (×3): qty 2

## 2016-10-23 MED ORDER — DOCUSATE SODIUM 100 MG PO CAPS
100.0000 mg | ORAL_CAPSULE | Freq: Two times a day (BID) | ORAL | Status: DC
  Administered 2016-10-23 – 2016-10-25 (×5): 100 mg via ORAL
  Filled 2016-10-23 (×5): qty 1

## 2016-10-23 MED ORDER — CEFAZOLIN SODIUM-DEXTROSE 1-4 GM/50ML-% IV SOLN
1.0000 g | Freq: Four times a day (QID) | INTRAVENOUS | Status: AC
  Administered 2016-10-23 (×2): 1 g via INTRAVENOUS
  Filled 2016-10-23 (×3): qty 50

## 2016-10-23 MED ORDER — TRANEXAMIC ACID 1000 MG/10ML IV SOLN
INTRAVENOUS | Status: DC | PRN
  Administered 2016-10-23: 2000 mg via TOPICAL

## 2016-10-23 MED ORDER — PROPOFOL 500 MG/50ML IV EMUL
INTRAVENOUS | Status: DC | PRN
  Administered 2016-10-23: 50 ug/kg/min via INTRAVENOUS

## 2016-10-23 MED ORDER — METOCLOPRAMIDE HCL 5 MG/ML IJ SOLN
5.0000 mg | Freq: Three times a day (TID) | INTRAMUSCULAR | Status: DC | PRN
Start: 2016-10-23 — End: 2016-10-25

## 2016-10-23 MED ORDER — ONDANSETRON HCL 4 MG/2ML IJ SOLN
INTRAMUSCULAR | Status: AC
  Filled 2016-10-23: qty 2

## 2016-10-23 MED ORDER — PROPOFOL 10 MG/ML IV BOLUS
INTRAVENOUS | Status: AC
  Filled 2016-10-23: qty 20

## 2016-10-23 MED ORDER — SUCCINYLCHOLINE CHLORIDE 200 MG/10ML IV SOSY
PREFILLED_SYRINGE | INTRAVENOUS | Status: AC
  Filled 2016-10-23: qty 10

## 2016-10-23 MED ORDER — FENTANYL CITRATE (PF) 250 MCG/5ML IJ SOLN
INTRAMUSCULAR | Status: AC
  Filled 2016-10-23: qty 5

## 2016-10-23 MED ORDER — ONDANSETRON HCL 4 MG PO TABS: 4.0000 mg | ORAL_TABLET | Freq: Four times a day (QID) | ORAL | Status: DC | PRN

## 2016-10-23 MED ORDER — ASPIRIN EC 325 MG PO TBEC
325.0000 mg | DELAYED_RELEASE_TABLET | Freq: Every day | ORAL | Status: DC
  Administered 2016-10-24 – 2016-10-25 (×2): 325 mg via ORAL
  Filled 2016-10-23 (×2): qty 1

## 2016-10-23 MED ORDER — HYDROMORPHONE HCL 1 MG/ML IJ SOLN
1.0000 mg | INTRAMUSCULAR | Status: DC | PRN
  Administered 2016-10-23 – 2016-10-24 (×6): 1 mg via INTRAVENOUS
  Filled 2016-10-23 (×6): qty 1

## 2016-10-23 MED ORDER — PANTOPRAZOLE SODIUM 40 MG PO TBEC
40.0000 mg | DELAYED_RELEASE_TABLET | Freq: Every day | ORAL | Status: DC
  Administered 2016-10-24 – 2016-10-25 (×2): 40 mg via ORAL
  Filled 2016-10-23 (×2): qty 1

## 2016-10-23 MED ORDER — POLYETHYLENE GLYCOL 3350 17 G PO PACK: 17.0000 g | PACK | Freq: Every day | ORAL | Status: DC | PRN

## 2016-10-23 MED ORDER — MIDAZOLAM HCL 5 MG/5ML IJ SOLN
INTRAMUSCULAR | Status: DC | PRN
  Administered 2016-10-23: 2 mg via INTRAVENOUS

## 2016-10-23 MED ORDER — ESTRADIOL 1 MG PO TABS
0.5000 mg | ORAL_TABLET | Freq: Every day | ORAL | Status: DC
  Administered 2016-10-23 – 2016-10-25 (×3): 0.5 mg via ORAL
  Filled 2016-10-23 (×3): qty 0.5

## 2016-10-23 MED ORDER — AMLODIPINE BESYLATE 10 MG PO TABS
10.0000 mg | ORAL_TABLET | Freq: Every day | ORAL | Status: DC
  Administered 2016-10-23 – 2016-10-25 (×3): 10 mg via ORAL
  Filled 2016-10-23 (×3): qty 1

## 2016-10-23 MED ORDER — PROMETHAZINE HCL 25 MG/ML IJ SOLN: 6.2500 mg | INTRAMUSCULAR | Status: DC | PRN

## 2016-10-23 MED ORDER — EPHEDRINE 5 MG/ML INJ
INTRAVENOUS | Status: AC
  Filled 2016-10-23: qty 10

## 2016-10-23 MED ORDER — PHENYLEPHRINE HCL 10 MG/ML IJ SOLN
INTRAVENOUS | Status: DC | PRN
  Administered 2016-10-23: 20 ug/min via INTRAVENOUS

## 2016-10-23 MED ORDER — ACETAMINOPHEN 650 MG RE SUPP: 650.0000 mg | Freq: Four times a day (QID) | RECTAL | Status: DC | PRN

## 2016-10-23 MED ORDER — SODIUM CHLORIDE 0.9 % IR SOLN
Status: DC | PRN
  Administered 2016-10-23: 3000 mL

## 2016-10-23 MED ORDER — ONDANSETRON HCL 4 MG/2ML IJ SOLN
4.0000 mg | Freq: Four times a day (QID) | INTRAMUSCULAR | Status: DC | PRN
  Administered 2016-10-23 – 2016-10-24 (×2): 4 mg via INTRAVENOUS
  Filled 2016-10-23 (×2): qty 2

## 2016-10-23 MED ORDER — FENTANYL CITRATE (PF) 100 MCG/2ML IJ SOLN
INTRAMUSCULAR | Status: DC | PRN
Start: 2016-10-23 — End: 2016-10-23
  Administered 2016-10-23: 50 ug via INTRAVENOUS

## 2016-10-23 MED ORDER — SODIUM CHLORIDE 0.9 % IV SOLN
INTRAVENOUS | Status: DC
  Administered 2016-10-23: 13:00:00 via INTRAVENOUS

## 2016-10-23 MED ORDER — METHOCARBAMOL 500 MG PO TABS
500.0000 mg | ORAL_TABLET | Freq: Four times a day (QID) | ORAL | Status: DC | PRN
  Administered 2016-10-24 – 2016-10-25 (×2): 500 mg via ORAL
  Filled 2016-10-23 (×2): qty 1

## 2016-10-23 MED ORDER — METOCLOPRAMIDE HCL 5 MG PO TABS
5.0000 mg | ORAL_TABLET | Freq: Three times a day (TID) | ORAL | Status: DC | PRN
Start: 2016-10-23 — End: 2016-10-25

## 2016-10-23 MED ORDER — LACTATED RINGERS IV SOLN
INTRAVENOUS | Status: DC | PRN
  Administered 2016-10-23: 08:00:00 via INTRAVENOUS

## 2016-10-23 MED ORDER — BISACODYL 10 MG RE SUPP: 10.0000 mg | Freq: Every day | RECTAL | Status: DC | PRN

## 2016-10-23 MED ORDER — MENTHOL 3 MG MT LOZG: 1.0000 | LOZENGE | OROMUCOSAL | Status: DC | PRN

## 2016-10-23 MED ORDER — HYDROMORPHONE HCL 1 MG/ML IJ SOLN: 0.2500 mg | INTRAMUSCULAR | Status: DC | PRN

## 2016-10-23 MED ORDER — OXYCODONE HCL 5 MG PO TABS
5.0000 mg | ORAL_TABLET | ORAL | Status: DC | PRN
  Administered 2016-10-23 (×2): 10 mg via ORAL
  Administered 2016-10-24 (×2): 5 mg via ORAL
  Administered 2016-10-24 – 2016-10-25 (×4): 10 mg via ORAL
  Filled 2016-10-23: qty 1
  Filled 2016-10-23 (×7): qty 2
  Filled 2016-10-23: qty 1

## 2016-10-23 MED ORDER — PHENOL 1.4 % MT LIQD
1.0000 | OROMUCOSAL | Status: DC | PRN
Start: 2016-10-23 — End: 2016-10-25

## 2016-10-23 MED ORDER — TRANEXAMIC ACID 1000 MG/10ML IV SOLN
2000.0000 mg | INTRAVENOUS | Status: DC
  Filled 2016-10-23: qty 20

## 2016-10-23 MED ORDER — METHOCARBAMOL 1000 MG/10ML IJ SOLN
500.0000 mg | Freq: Four times a day (QID) | INTRAVENOUS | Status: DC | PRN
  Filled 2016-10-23: qty 5

## 2016-10-23 MED ORDER — BUPIVACAINE LIPOSOME 1.3 % IJ SUSP
20.0000 mL | INTRAMUSCULAR | Status: DC
Start: 2016-10-23 — End: 2016-10-23
  Filled 2016-10-23: qty 20

## 2016-10-23 MED ORDER — 0.9 % SODIUM CHLORIDE (POUR BTL) OPTIME
TOPICAL | Status: DC | PRN
  Administered 2016-10-23: 1000 mL

## 2016-10-23 MED ORDER — CARVEDILOL 25 MG PO TABS
25.0000 mg | ORAL_TABLET | Freq: Two times a day (BID) | ORAL | Status: DC
  Administered 2016-10-23 – 2016-10-25 (×4): 25 mg via ORAL
  Filled 2016-10-23 (×4): qty 1

## 2016-10-23 MED ORDER — ACETAMINOPHEN 325 MG PO TABS
650.0000 mg | ORAL_TABLET | Freq: Four times a day (QID) | ORAL | Status: DC | PRN
  Administered 2016-10-24: 650 mg via ORAL
  Filled 2016-10-23: qty 2

## 2016-10-23 MED ORDER — ROPIVACAINE HCL 5 MG/ML IJ SOLN
INTRAMUSCULAR | Status: DC | PRN
  Administered 2016-10-23: 150 mg via PERINEURAL

## 2016-10-23 SURGICAL SUPPLY — 53 items
BLADE SAGITTAL 25.0X1.19X90 (BLADE) ×2 IMPLANT
BLADE SAGITTAL 25.0X1.19X90MM (BLADE) ×1
BLADE SAW SGTL 13X75X1.27 (BLADE) ×3 IMPLANT
BLADE SURG 21 STRL SS (BLADE) ×6 IMPLANT
BNDG COHESIVE 6X5 TAN STRL LF (GAUZE/BANDAGES/DRESSINGS) ×2 IMPLANT
BNDG GAUZE ELAST 4 BULKY (GAUZE/BANDAGES/DRESSINGS) ×1 IMPLANT
BONE CEMENT PALACOSE (Cement) ×6 IMPLANT
BOWL SMART MIX CTS (DISPOSABLE) ×3 IMPLANT
CAP KNEE TOTAL 3 SIGMA ×2 IMPLANT
CEMENT BONE PALACOSE (Cement) ×2 IMPLANT
COVER SURGICAL LIGHT HANDLE (MISCELLANEOUS) ×4 IMPLANT
CUFF TOURNIQUET SINGLE 34IN LL (TOURNIQUET CUFF) ×3 IMPLANT
CUFF TOURNIQUET SINGLE 44IN (TOURNIQUET CUFF) IMPLANT
DRAPE EXTREMITY T 121X128X90 (DRAPE) ×3 IMPLANT
DRAPE HALF SHEET 40X57 (DRAPES) ×4 IMPLANT
DRAPE U-SHAPE 47X51 STRL (DRAPES) ×3 IMPLANT
DRSG ADAPTIC 3X8 NADH LF (GAUZE/BANDAGES/DRESSINGS) ×3 IMPLANT
DRSG PAD ABDOMINAL 8X10 ST (GAUZE/BANDAGES/DRESSINGS) ×3 IMPLANT
DURAPREP 26ML APPLICATOR (WOUND CARE) ×3 IMPLANT
ELECT REM PT RETURN 9FT ADLT (ELECTROSURGICAL) ×3
ELECTRODE REM PT RTRN 9FT ADLT (ELECTROSURGICAL) ×1 IMPLANT
FACESHIELD WRAPAROUND (MASK) ×3 IMPLANT
FACESHIELD WRAPAROUND OR TEAM (MASK) ×1 IMPLANT
GAUZE SPONGE 4X4 12PLY STRL (GAUZE/BANDAGES/DRESSINGS) ×1 IMPLANT
GLOVE BIOGEL PI IND STRL 9 (GLOVE) ×1 IMPLANT
GLOVE BIOGEL PI INDICATOR 9 (GLOVE) ×2
GLOVE SURG ORTHO 9.0 STRL STRW (GLOVE) ×3 IMPLANT
GOWN STRL REUS W/ TWL XL LVL3 (GOWN DISPOSABLE) ×2 IMPLANT
GOWN STRL REUS W/TWL XL LVL3 (GOWN DISPOSABLE) ×6
HANDPIECE INTERPULSE COAX TIP (DISPOSABLE) ×3
KIT BASIN OR (CUSTOM PROCEDURE TRAY) ×3 IMPLANT
KIT ROOM TURNOVER OR (KITS) ×3 IMPLANT
MANIFOLD NEPTUNE II (INSTRUMENTS) ×3 IMPLANT
NDL SPNL 18GX3.5 QUINCKE PK (NEEDLE) ×1 IMPLANT
NEEDLE SPNL 18GX3.5 QUINCKE PK (NEEDLE) ×3 IMPLANT
NS IRRIG 1000ML POUR BTL (IV SOLUTION) ×3 IMPLANT
PACK TOTAL JOINT (CUSTOM PROCEDURE TRAY) ×3 IMPLANT
PACK UNIVERSAL I (CUSTOM PROCEDURE TRAY) ×3 IMPLANT
PAD ARMBOARD 7.5X6 YLW CONV (MISCELLANEOUS) ×3 IMPLANT
SET HNDPC FAN SPRY TIP SCT (DISPOSABLE) ×1 IMPLANT
STAPLER VISISTAT 35W (STAPLE) ×3 IMPLANT
SUCTION FRAZIER HANDLE 10FR (MISCELLANEOUS)
SUCTION TUBE FRAZIER 10FR DISP (MISCELLANEOUS) IMPLANT
SUT VIC AB 0 CT1 27 (SUTURE) ×3
SUT VIC AB 0 CT1 27XBRD ANBCTR (SUTURE) ×1 IMPLANT
SUT VIC AB 1 CTX 36 (SUTURE) ×3
SUT VIC AB 1 CTX36XBRD ANBCTR (SUTURE) IMPLANT
SYR 50ML LL SCALE MARK (SYRINGE) ×3 IMPLANT
TOWEL OR 17X24 6PK STRL BLUE (TOWEL DISPOSABLE) ×3 IMPLANT
TOWEL OR 17X26 10 PK STRL BLUE (TOWEL DISPOSABLE) ×3 IMPLANT
TRAY CATH 16FR W/PLASTIC CATH (SET/KITS/TRAYS/PACK) IMPLANT
TRAY FOLEY W/METER SILVER 16FR (SET/KITS/TRAYS/PACK) IMPLANT
WRAP KNEE MAXI GEL POST OP (GAUZE/BANDAGES/DRESSINGS) ×3 IMPLANT

## 2016-10-23 NOTE — Anesthesia Postprocedure Evaluation (Addendum)
Anesthesia Post Note  Patient: [name]  Procedure(s) Performed: Procedure(s) (LRB): RIGHT TOTAL KNEE ARTHROPLASTY (Right)  Patient location during evaluation: PACU Anesthesia Type: MAC Level of consciousness: awake and alert Pain management: pain level controlled Vital Signs Assessment: post-procedure vital signs reviewed and stable Respiratory status: spontaneous breathing and respiratory function stable Cardiovascular status: blood pressure returned to baseline and stable Postop Assessment: spinal receding Anesthetic complications: no       Last Vitals:  Vitals:   10/23/16 1045 10/23/16 1054  BP:  131/60  Pulse: (!) 50 (!) 51  Resp: 19 17  Temp:      Last Pain:  Vitals:   10/23/16 0954  TempSrc:   PainSc: 0-No pain    LLE Motor Response: Purposeful movement (10/23/16 1054)   RLE Motor Response: Purposeful movement;Other (Comment) (move foot side to side) (10/23/16 1054)   L Sensory Level: L3-Anterior knee, lower leg (10/23/16 1054) R Sensory Level: L3-Anterior knee, lower leg (10/23/16 1054)  Justyce Yeater DANIEL

## 2016-10-23 NOTE — Transfer of Care (Signed)
Immediate Anesthesia Transfer of Care Note  Patient: [name]  Procedure(s) Performed: Procedure(s): RIGHT TOTAL KNEE ARTHROPLASTY (Right)  Patient Location: PACU  Anesthesia Type:MAC and Spinal  Level of Consciousness: awake, alert , oriented and patient cooperative  Airway & Oxygen Therapy: Patient Spontanous Breathing and Patient connected to nasal cannula oxygen  Post-op Assessment: Report given to RN and Post -op Vital signs reviewed and stable  Post vital signs: Reviewed and stable  Last Vitals:  Vitals:   10/23/16 0633  BP: (!) 155/56  Pulse: 60  Resp: 20  Temp: 36.5 C    Last Pain:  Vitals:   10/23/16 0633  TempSrc: Oral  PainSc:       Patients Stated Pain Goal: 3 (35/07/57 3225)  Complications: No apparent anesthesia complications

## 2016-10-23 NOTE — Anesthesia Procedure Notes (Signed)
Procedure Name: MAC Date/Time: 10/23/2016 8:20 AM Performed by: Carney Living Pre-anesthesia Checklist: Patient identified, Emergency Drugs available, Suction available, Patient being monitored and Timeout performed Patient Re-evaluated:Patient Re-evaluated prior to inductionOxygen Delivery Method: Nasal cannula

## 2016-10-23 NOTE — Evaluation (Signed)
Physical Therapy Evaluation Patient Details Name: Lydia Graham MRN: [id number] DOB: 19-Jul-1935 Today's Date: 10/23/2016   History of Present Illness  Pt is a 81 y/o female s/p elective R TKA secondary to R knee OA. PMH includes HTN and back surgery.   Clinical Impression  Pt is s/p surgery above with deficits below. PTA, pt was independent with all functional mobility and not using an AD. Upon evaluation, pt presenting with post op pain and weakness which limited ambulation tolerance this session. Pt required min to min guard A for functional mobility tasks this session. Recommending d/c recommendations below. Will have necessary assist level from sister at d/c. Will continue to follow and progress mobility according to pt tolerance.     Follow Up Recommendations Home health PT;Supervision/Assistance - 24 hour    Equipment Recommendations  None recommended by PT    Recommendations for Other Services       Precautions / Restrictions Precautions Precautions: Knee Precaution Booklet Issued: Yes (comment) Precaution Comments: Reviewed supine ther ex with pt.  Restrictions Weight Bearing Restrictions: Yes RLE Weight Bearing: Weight bearing as tolerated      Mobility  Bed Mobility Overal bed mobility: Needs Assistance Bed Mobility: Supine to Sit     Supine to sit: Min guard;HOB elevated     General bed mobility comments: Min guard for steadying. Use of bed rails and elevated HOB for trunk elevation and scooting hips to EOB.   Transfers Overall transfer level: Needs assistance Equipment used: Rolling walker (2 wheeled) Transfers: Sit to/from Stand Sit to Stand: Min assist         General transfer comment: Min A for lift assist and for steadying once standing. Verbal cues for hand placement.   Ambulation/Gait Ambulation/Gait assistance: Min assist Ambulation Distance (Feet): 15 Feet Assistive device: Rolling walker (2 wheeled) Gait Pattern/deviations: Step-to  pattern;Decreased step length - right;Decreased weight shift to right;Antalgic;Trunk flexed Gait velocity: Decreased Gait velocity interpretation: Below normal speed for age/gender General Gait Details: Slow, antalgic gait secondary to post op pain and weakness. Pt requiring verbal cues for upright posture. Slight knee buckling noted, but no LOB.   Stairs            Wheelchair Mobility    Modified Rankin (Stroke Patients Only)       Balance Overall balance assessment: Needs assistance Sitting-balance support: No upper extremity supported;Feet supported Sitting balance-Leahy Scale: Good     Standing balance support: Bilateral upper extremity supported;During functional activity Standing balance-Leahy Scale: Poor Standing balance comment: Reliant on RW for steadying                              Pertinent Vitals/Pain Pain Assessment: 0-10 Pain Score: 3  Pain Location: R knee Pain Descriptors / Indicators: Aching Pain Intervention(s): Limited activity within patient's tolerance;Monitored during session;Repositioned    Home Living Family/patient expects to be discharged to:: Private residence Living Arrangements: Alone;Other (Comment) (sister is coming to stay with pt) Available Help at Discharge: Family;Available 24 hours/day Type of Home: House Home Access: Stairs to enter Entrance Stairs-Rails: Left Entrance Stairs-Number of Steps: 2 Home Layout: One level Home Equipment: Walker - 2 wheels;Cane - single point;Bedside commode      Prior Function Level of Independence: Independent               Hand Dominance   Dominant Hand: Right    Extremity/Trunk Assessment   Upper Extremity Assessment Upper Extremity  Assessment: Defer to OT evaluation    Lower Extremity Assessment Lower Extremity Assessment: RLE deficits/detail RLE Deficits / Details: Sensory system in tact. Deficits consistent with post op pain and weakness.        Communication    Communication: No difficulties  Cognition Arousal/Alertness: Awake/alert Behavior During Therapy: WFL for tasks assessed/performed Overall Cognitive Status: Within Functional Limits for tasks assessed                                        General Comments General comments (skin integrity, edema, etc.): Pt's daughter present during session. Pt reporting her sister will be coming to stay with her at her home at d/c.     Exercises Total Joint Exercises Ankle Circles/Pumps: AROM;Both;10 reps;Supine Quad Sets: AROM;Right;10 reps;Supine Towel Squeeze: AROM;Both;10 reps;Supine Heel Slides: AROM;Right;10 reps;Supine (partial range) Hip ABduction/ADduction: AROM;Right;10 reps;Supine   Assessment/Plan    PT Assessment Patient needs continued PT services  PT Problem List Decreased strength;Decreased activity tolerance;Decreased range of motion;Decreased balance;Decreased mobility;Decreased knowledge of use of DME;Decreased knowledge of precautions;Pain       PT Treatment Interventions DME instruction;Stair training;Gait training;Functional mobility training;Therapeutic activities;Therapeutic exercise;Balance training;Neuromuscular re-education;Patient/family education    PT Goals (Current goals can be found in the Care Plan section)  Acute Rehab PT Goals Patient Stated Goal: to go home  PT Goal Formulation: With patient Time For Goal Achievement: 10/30/16 Potential to Achieve Goals: Good    Frequency 7X/week   Barriers to discharge        Co-evaluation               AM-PAC PT "6 Clicks" Daily Activity  Outcome Measure Difficulty turning over in bed (including adjusting bedclothes, sheets and blankets)?: A Little Difficulty moving from lying on back to sitting on the side of the bed? : A Lot Difficulty sitting down on and standing up from a chair with arms (e.g., wheelchair, bedside commode, etc,.)?: Total Help needed moving to and from a bed to chair  (including a wheelchair)?: A Little Help needed walking in hospital room?: A Little Help needed climbing 3-5 steps with a railing? : A Little 6 Click Score: 15    End of Session Equipment Utilized During Treatment: Gait belt Activity Tolerance: Patient limited by pain Patient left: in chair;with call bell/phone within reach Nurse Communication: Mobility status PT Visit Diagnosis: Other abnormalities of gait and mobility (R26.89);Pain Pain - Right/Left: Right Pain - part of body: Knee    Time: 1415-1449 PT Time Calculation (min) (ACUTE ONLY): 34 min   Charges:   PT Evaluation $PT Eval Low Complexity: 1 Procedure PT Treatments $Gait Training: 8-22 mins   PT G Codes:        Nicky Pugh, PT, DPT  Acute Rehabilitation Services  Pager: [phone]   Army Melia 10/23/2016, 3:05 PM

## 2016-10-23 NOTE — H&P (Signed)
TOTAL KNEE ADMISSION H&P  Patient is being admitted for right total knee arthroplasty.  Subjective:  Chief Complaint:right knee pain.  HPI: Lydia Graham, 81 y.o. female, has a history of pain and functional disability in the right knee due to arthritis and has failed non-surgical conservative treatments for greater than 12 weeks to includeNSAID's and/or analgesics, corticosteriod injections and activity modification.  Onset of symptoms was gradual, starting 8 years ago with gradually worsening course since that time. The patient noted no past surgery on the right knee(s).  Patient currently rates pain in the right knee(s) at 8 out of 10 with activity. Patient has night pain, worsening of pain with activity and weight bearing, pain that interferes with activities of daily living, pain with passive range of motion, crepitus and joint swelling.  Patient has evidence of subchondral cysts, subchondral sclerosis, periarticular osteophytes, joint subluxation and joint space narrowing by imaging studies. This patient has had avascular necrosis of the knee. There is no active infection.  Patient Active Problem List   Diagnosis Date Noted  . Unilateral primary osteoarthritis, right knee 07/13/2016   Past Medical History:  Diagnosis Date  . Arthritis    oa  . GERD (gastroesophageal reflux disease)   . Headache   . Hypertension     Past Surgical History:  Procedure Laterality Date  . ABDOMINAL HYSTERECTOMY    . APPENDECTOMY    . BACK SURGERY    . TONSILLECTOMY      Prescriptions Prior to Admission  Medication Sig Dispense Refill Last Dose  . amLODipine (NORVASC) 10 MG tablet Take 10 mg by mouth daily at 12 noon.    Taking  . butalbital-acetaminophen-caffeine (FIORICET, ESGIC) 50-325-40 MG tablet Take 1 tablet by mouth every 6 (six) hours as needed for headache or migraine.    Taking  . calcium-vitamin D (OSCAL WITH D) 500-200 MG-UNIT tablet Take 1 tablet by mouth daily.     . carvedilol  (COREG) 25 MG tablet Take 25 mg by mouth 2 (two) times daily with a meal.    Taking  . Cyanocobalamin (VITAMIN B 12 PO) Take 1 tablet by mouth daily.    Taking  . estradiol (ESTRACE) 0.5 MG tablet Take 0.5 mg by mouth daily.    Taking  . fluticasone (FLONASE) 50 MCG/ACT nasal spray Place 1 spray into both nostrils daily.     Marland Kitchen losartan (COZAAR) 100 MG tablet Take 100 mg by mouth daily at 12 noon.    Taking  . magnesium hydroxide (MILK OF MAGNESIA) 400 MG/5ML suspension Take 30 mLs by mouth daily as needed for mild constipation.     . Multiple Vitamins-Minerals (MULTIVITAMIN WITH MINERALS) tablet Take 1 tablet by mouth daily.   Taking  . naproxen sodium (ANAPROX) 220 MG tablet Take 220 mg by mouth daily.    Taking  . Omega-3 Fatty Acids (FISH OIL) 1000 MG CAPS Take 1,000 mg by mouth daily.    Taking  . pantoprazole (PROTONIX) 40 MG tablet Take 40 mg by mouth daily before breakfast.    Taking   Allergies  Allergen Reactions  . Rosuvastatin Other (See Comments)    MYALGIA's Muscle pain    Social History  Substance Use Topics  . Smoking status: Never Smoker  . Smokeless tobacco: Never Used  . Alcohol use No    No family history on file.   Review of Systems  All other systems reviewed and are negative.   Objective:  Physical Exam  Vital signs in  last 24 hours: Temp:  [97.7 F (36.5 C)] 97.7 F (36.5 C) (05/02 1540) Pulse Rate:  [60] 60 (05/02 0633) Resp:  [20] 20 (05/02 0633) BP: (155)/(56) 155/56 (05/02 0633) SpO2:  [99 %] 99 % (05/02 0867) Weight:  [157 lb 14.4 oz (71.6 kg)] 157 lb 14.4 oz (71.6 kg) (05/02 6195)  Labs:   Estimated body mass index is 24.73 kg/m as calculated from the following:   Height as of 10/14/16: 5\' 7"  (1.702 m).   Weight as of this encounter: 157 lb 14.4 oz (71.6 kg).   Imaging Review Plain radiographs demonstrate moderate degenerative joint disease of the right knee(s). The overall alignment ismild varus. The bone quality appears to be adequate  for age and reported activity level.  Assessment/Plan:  End stage arthritis, right knee   The patient history, physical examination, clinical judgment of the provider and imaging studies are consistent with end stage degenerative joint disease of the right knee(s) and total knee arthroplasty is deemed medically necessary. The treatment options including medical management, injection therapy arthroscopy and arthroplasty were discussed at length. The risks and benefits of total knee arthroplasty were presented and reviewed. The risks due to aseptic loosening, infection, stiffness, patella tracking problems, thromboembolic complications and other imponderables were discussed. The patient acknowledged the explanation, agreed to proceed with the plan and consent was signed. Patient is being admitted for inpatient treatment for surgery, pain control, PT, OT, prophylactic antibiotics, VTE prophylaxis, progressive ambulation and ADL's and discharge planning. The patient is planning to be discharged home with home health services

## 2016-10-23 NOTE — Discharge Instructions (Signed)

## 2016-10-23 NOTE — Anesthesia Procedure Notes (Signed)
Anesthesia Regional Block: Adductor canal block   Pre-Anesthetic Checklist: ,, timeout performed, Correct Patient, Correct Site, Correct Laterality, Correct Procedure, Correct Position, site marked, Risks and benefits discussed,  Surgical consent,  Pre-op evaluation,  At surgeon's request and post-op pain management  Laterality: Right  Prep: chloraprep       Needles:  Injection technique: Single-shot  Needle Type: Stimulator Needle - 80     Needle Length: 10cm  Needle Gauge: 21     Additional Needles:   Procedures: ultrasound guided,,,,,,,,  Narrative:  Start time: 10/23/2016 7:56 AM End time: 10/23/2016 8:04 AM Injection made incrementally with aspirations every 5 mL.  Performed by: Personally

## 2016-10-23 NOTE — Op Note (Signed)
DATE OF SURGERY:  10/23/2016  TIME: 9:37 AM  PATIENT NAME:  Lydia Graham    AGE: 81 y.o.    PRE-OPERATIVE DIAGNOSIS:  Osteoarthritis Right Knee  POST-OPERATIVE DIAGNOSIS:  Osteoarthritis Right Knee  PROCEDURE:  Procedure(s): RIGHT TOTAL KNEE ARTHROPLASTY  SURGEON: Meridee Score  ASSISTANT: April Green  OPERATIVE IMPLANTS: Depuy , Posterior Stabilized.  Femur size 5, Tibia size 5, Patella size 32 3-peg oval button, with a 7 mm polyethylene insert.   PREOPERATIVE INDICATIONS:   Shyanna Klingel Walthall is a 81 y.o. year old female with end stage degenerative arthritis of the knee who failed conservative treatment and elected for Total Knee Arthroplasty.   The risks, benefits, and alternatives were discussed at length including but not limited to the risks of infection, bleeding, nerve injury, stiffness, blood clots, the need for revision surgery, cardiopulmonary complications, among others, and they were willing to proceed.  OPERATIVE DESCRIPTION:  The patient was brought to the operative room and placed in a supine position.  General anesthesia was administered.  IV antibiotics were given.  The lower extremity was prepped and draped in the usual sterile fashion.  Charlie Pitter was used to cover all exposed skin. Time out was performed.    Anterior quadriceps tendon splitting approach was performed.  The patella was everted and osteophytes were removed.  The anterior horn of the medial and lateral meniscus was removed.   The distal femur was opened with the drill and the intramedullary distal femoral cutting jig was utilized, set at 5 degrees valgus resecting 9 mm off the distal femur.  Care was taken to protect the collateral ligaments.  Then the extramedullary tibial cutting jig was utilized making the appropriate cut using the anterior tibial crest as a reference building in appropriate posterior slope.  Care was taken during the cut to protect the medial and collateral ligaments.  The  proximal tibia was removed along with the posterior horns of the menisci.  The PCL was sacrificed.    The extensor gap was measured and was approximately 74mm.    The distal femoral sizing jig was applied, taking care to avoid notching.  Then the 4-in-1 cutting jig was applied and the anterior and posterior femur was cut, along with the chamfer cuts.  All posterior osteophytes were removed.  The flexion gap was then measured and was symmetric with the extension gap.  The distal femoral preparation using the appropriate jig to prepare the box.  The patella was then measured, and cut with the saw.    The proximal tibia sized and prepared accordingly with the reamer and the punch, and then all components were trialed with the 90mm poly insert.  The knee was found to have stable balance and full motion.  The knee was irrigated with normal saline and the knee was soaked with TXA.  The above named components were then cemented into place and all excess cement was removed.  The final polyethylene component was in place during cementation.  The knee was  taken through a range of motion and the patella tracked well and the knee irrigated copiously and the parapatellar and subcutaneous tissue closed with vicryl, and skin closed with staples..  A sterile dressing was applied and patient  was taken to the PACU in stable  condition.  There were no complications.  Total tourniquet time was 20 minutes.

## 2016-10-24 ENCOUNTER — Encounter (HOSPITAL_COMMUNITY): Payer: Self-pay | Admitting: Orthopedic Surgery

## 2016-10-24 NOTE — Progress Notes (Signed)
Stopped by to visit with pt and also met her female companion in rm engaged in many small ministrations such as adjusting bed/covers to ensure pt's comfort. Chatted briefly, and ptsaid she had no needs at this time. Advised she can always ask nurse to call for a chaplain if she wanted to talk further another time.   10/24/16 1500  Clinical Encounter Type  Visited With Patient and family together  Visit Type Initial;Social support  Referral From Chaplain  Spiritual Encounters  Spiritual Needs Emotional  Stress Factors  Patient Stress Factors Health changes;Loss of control   Gerrit Heck, Chaplain

## 2016-10-24 NOTE — Clinical Social Work Note (Signed)
CSW consulted for SNF placement. P/T & O/T rec home health PT. Pt's sister (retired Therapist, sports) to provide assistance at home. RNCM aware and following for d/c needs. CSW signing off as no further SW needs identified. Please reconsult if new SW needs arise.   Oretha Ellis, [place], [place] Work [phone]

## 2016-10-24 NOTE — Evaluation (Signed)
Occupational Therapy Evaluation Patient Details Name: Lydia Graham MRN: [id number] DOB: 03-07-1936 Today's Date: 10/24/2016    History of Present Illness Pt is a 81 y/o female s/p elective R TKA secondary to R knee OA. PMH includes HTN and back surgery.    Clinical Impression   Pt admitted with the above diagnoses and presents with below problem list. Pt will benefit from continued acute OT to address the below listed deficits and maximize independence with basic ADLs prior to d/c home with family assisting. PTA pt was independent with ADLs. Pt currently min guard to min A with LB ADLs and functional transfers/mobility. Sister present and involved during session. Pt on RA for session, needed cues not to hold breath during transitional movements to maintain good O2 sats. Session details below.       Follow Up Recommendations  Home health OT;Supervision - Intermittent;Other (comment) (OOB/mobility)    Equipment Recommendations  3 in 1 bedside commode    Recommendations for Other Services       Precautions / Restrictions Precautions Precautions: Knee Precaution Comments: reviewed precautions Restrictions Weight Bearing Restrictions: Yes RLE Weight Bearing: Weight bearing as tolerated      Mobility Bed Mobility Overal bed mobility: Needs Assistance Bed Mobility: Supine to Sit     Supine to sit: Min assist;HOB elevated     General bed mobility comments: Assist to advance RLE. Pt using BUE and LLE well to facilitate coming to EOB position.   Transfers Overall transfer level: Needs assistance Equipment used: Rolling walker (2 wheeled) Transfers: Sit to/from Stand Sit to Stand: Min guard;Min assist         General transfer comment: min guard to min A to steady. from EOB, 3n1, and into recliner. cues for technique with rw.    Balance Overall balance assessment: Needs assistance Sitting-balance support: No upper extremity supported;Feet supported Sitting  balance-Leahy Scale: Good     Standing balance support: Bilateral upper extremity supported;During functional activity Standing balance-Leahy Scale: Poor Standing balance comment: Reliant on RW for steadying                            ADL either performed or assessed with clinical judgement   ADL Overall ADL's : Needs assistance/impaired Eating/Feeding: Set up;Sitting   Grooming: Min guard;Standing;Set up;Sitting   Upper Body Bathing: Set up;Sitting   Lower Body Bathing: Minimal assistance;Sit to/from stand   Upper Body Dressing : Set up;Sitting   Lower Body Dressing: Minimal assistance;Sit to/from stand   Toilet Transfer: Min guard;Minimal assistance;RW;Ambulation   Toileting- Clothing Manipulation and Hygiene: Set up;Minimal assistance;Sitting/lateral lean;Sit to/from stand Toileting - Clothing Manipulation Details (indicate cue type and reason): pt completed anterior pericare in seated position. Tub/ Shower Transfer: Walk-in shower;Minimal assistance;Ambulation;3 in 1   Functional mobility during ADLs: Min guard;Rolling walker General ADL Comments: Pt completed bed mobility, ambulated to bathroom, toilet transfer and pericare as detailed above. Sister present and involved in session. ADL education given.      Vision         Perception     Praxis      Pertinent Vitals/Pain Pain Assessment: 0-10 Pain Score: 5  Pain Location: R knee at end of session Pain Descriptors / Indicators: Aching Pain Intervention(s): Monitored during session;Repositioned;Ice applied;Limited activity within patient's tolerance     Hand Dominance Right   Extremity/Trunk Assessment Upper Extremity Assessment Upper Extremity Assessment: Overall WFL for tasks assessed   Lower Extremity Assessment Lower  Extremity Assessment: Defer to PT evaluation       Communication Communication Communication: No difficulties   Cognition Arousal/Alertness: Awake/alert Behavior During  Therapy: WFL for tasks assessed/performed Overall Cognitive Status: Within Functional Limits for tasks assessed                                     General Comments  Pt on 2L of O2 at start of session due to O2 dropping overnight. Discussed coming off O2 for session with nurse. Pt on RA during session with O2 sat mostly in low-mid 90s. O2 did drop to low 80s during bed mobility. Pt reporting holding breath during transitions. Educated on breathing techniques and not holding her breath during transfers/mobility. Left on RA. Notified nursing.     Exercises     Shoulder Instructions      Home Living Family/patient expects to be discharged to:: Private residence Living Arrangements: Alone;Other (Comment) (sister is coming to stay with pt) Available Help at Discharge: Family;Available 24 hours/day Type of Home: House Home Access: Stairs to enter [employer] of Steps: 2 Entrance Stairs-Rails: Left Home Layout: One level     Bathroom Shower/Tub: Occupational psychologist: Handicapped height     Home Equipment: Environmental consultant - 2 wheels;Cane - single point;Bedside commode          Prior Functioning/Environment Level of Independence: Independent                 OT Problem List: Impaired balance (sitting and/or standing);Decreased knowledge of use of DME or AE;Decreased knowledge of precautions;Pain      OT Treatment/Interventions: Self-care/ADL training;DME and/or AE instruction;Therapeutic activities;Patient/family education;Balance training    OT Goals(Current goals can be found in the care plan section) Acute Rehab OT Goals Patient Stated Goal: to go home  OT Goal Formulation: With patient/family Time For Goal Achievement: 10/31/16 Potential to Achieve Goals: Good ADL Goals Pt Will Perform Grooming: with supervision;standing Pt Will Perform Lower Body Bathing: with min guard assist;with adaptive equipment;sit to/from stand Pt Will Perform  Lower Body Dressing: with min guard assist;with adaptive equipment;sit to/from stand Pt Will Transfer to Toilet: with min guard assist;ambulating Pt Will Perform Toileting - Clothing Manipulation and hygiene: with min guard assist;sit to/from stand Pt Will Perform Tub/Shower Transfer: Shower transfer;with supervision;ambulating;3 in 1;rolling walker Additional ADL Goal #1: Pt will complete bed mobility at mod I level to prepare for OOB ADLs.   OT Frequency: Min 2X/week   Barriers to D/C:            Co-evaluation              AM-PAC PT "6 Clicks" Daily Activity     Outcome Measure                 End of Session Equipment Utilized During Treatment: Rolling walker Nurse Communication: Other (comment) (O2 on RA mostly in upper 90s)  Activity Tolerance: Patient tolerated treatment well Patient left: in chair;with call bell/phone within reach;with family/visitor present  OT Visit Diagnosis: Unsteadiness on feet (R26.81);Pain Pain - part of body: Knee                Time: 4270-6237 OT Time Calculation (min): 33 min Charges:  OT General Charges $OT Visit: 1 Procedure OT Evaluation $OT Eval Low Complexity: 1 Procedure OT Treatments $Self Care/Home Management : 8-22 mins G-Codes:  Hortencia Pilar 10/24/2016, 11:08 AM

## 2016-10-24 NOTE — Care Management Note (Signed)
Case Management Note  Patient Details  Name: Lydia Graham MRN: [id number] Date of Birth: [birth year]  Subjective/Objective:    81 yr old female s/p right total knee arthroplasty.                Action/Plan: Case manager spoke with patient and her sister concerning discharge plan and DME needs. Patient was preoperatively setup with [facility], no changes. Patient states she has RW and 3in1 and that her sister will assist her at discharge.    Expected Discharge Date:    10/25/16              Expected Discharge Plan:  [place]  In-House Referral:  NA  Discharge planning Services  CM Consult  Post Acute Care Choice:  Home Health Choice offered to:  Patient  DME Arranged:   (Has RW and 3in1 from previous surgery) DME Agency:  NA  HH Arranged:  PT, OT HH Agency:  [facility] (formerly [employer])  Status of Service:  Completed, signed off  If discussed at [employer], dates discussed:    Additional Comments:  Ninfa Meeker, RN 10/24/2016, 11:43 AM

## 2016-10-24 NOTE — Progress Notes (Signed)
Patient ID: Lydia Graham, female   DOB: Dec 26, 1935, 81 y.o.   MRN: [id number] Postoperative day 1 total knee arthroplasty. Patient states that she was out of bed 3 times last night. Plan for continued physical therapy today anticipate discharge to home Friday.

## 2016-10-24 NOTE — Progress Notes (Signed)
Physical Therapy Treatment Patient Details Name: Lydia Graham MRN: [id number] DOB: 02-12-1936 Today's Date: 10/24/2016    History of Present Illness Pt is a 81 y/o female s/p elective R TKA secondary to R knee OA. PMH includes HTN and back surgery.     PT Comments    Pt able to ambulate in room with RW and min to min/guard.  Pt does fatigue quickly.  She will have A at home from her sister, who is a retired Marine scientist.  Recommend HHPT.   Follow Up Recommendations  Home health PT;Supervision/Assistance - 24 hour     Equipment Recommendations  None recommended by PT    Recommendations for Other Services       Precautions / Restrictions Precautions Precautions: Knee Precaution Booklet Issued: Yes (comment) Precaution Comments: reviewed therex Restrictions Weight Bearing Restrictions: Yes RLE Weight Bearing: Weight bearing as tolerated    Mobility  Bed Mobility Overal bed mobility: Needs Assistance Bed Mobility: Supine to Sit     Supine to sit: Min assist;HOB elevated     General bed mobility comments: pt up in recliner upon arrival  Transfers Overall transfer level: Needs assistance Equipment used: Rolling walker (2 wheeled) Transfers: Sit to/from Stand Sit to Stand: Min assist         General transfer comment: MIN A to power up and cues for hand placement  Ambulation/Gait Ambulation/Gait assistance: Min assist;Min guard Ambulation Distance (Feet): 28 Feet Assistive device: Rolling walker (2 wheeled) Gait Pattern/deviations: Step-to pattern;Decreased stance time - right;Trunk flexed Gait velocity: Decreased   General Gait Details: pt fatigued quickly, but giving forth good effort.  MIN A for turns only.  no LOB.  o2 91% on RA   Stairs            Wheelchair Mobility    Modified Rankin (Stroke Patients Only)       Balance Overall balance assessment: Needs assistance Sitting-balance support: No upper extremity supported;Feet supported Sitting  balance-Leahy Scale: Good     Standing balance support: Bilateral upper extremity supported;During functional activity Standing balance-Leahy Scale: Poor Standing balance comment: requires RW for UE support                            Cognition Arousal/Alertness: Awake/alert Behavior During Therapy: WFL for tasks assessed/performed Overall Cognitive Status: Within Functional Limits for tasks assessed                                        Exercises Total Joint Exercises Ankle Circles/Pumps: AROM;Both;10 reps Quad Sets: AROM;Right;10 reps Towel Squeeze: AROM;Both;10 reps Heel Slides: AAROM;Right;10 reps Hip ABduction/ADduction: AROM;AAROM;Right;10 reps    General Comments General comments (skin integrity, edema, etc.): sister (retired Marine scientist) present      Pertinent Vitals/Pain Pain Assessment: 0-10 Pain Score: 5  Pain Location: R knee Pain Descriptors / Indicators: Aching Pain Intervention(s): Limited activity within patient's tolerance;Monitored during session;Repositioned    Home Living Family/patient expects to be discharged to:: Private residence Living Arrangements: Alone;Other (Comment) (sister is coming to stay with pt) Available Help at Discharge: Family;Available 24 hours/day Type of Home: House Home Access: Stairs to enter Entrance Stairs-Rails: Left Home Layout: One level Home Equipment: Walker - 2 wheels;Cane - single point;Bedside commode      Prior Function Level of Independence: Independent          PT Goals (  current goals can now be found in the care plan section) Acute Rehab PT Goals Patient Stated Goal: to go home  PT Goal Formulation: With patient Time For Goal Achievement: 10/30/16 Potential to Achieve Goals: Good Progress towards PT goals: Progressing toward goals    Frequency    7X/week      PT Plan Current plan remains appropriate    Co-evaluation              AM-PAC PT "6 Clicks" Daily Activity   Outcome Measure  Difficulty turning over in bed (including adjusting bedclothes, sheets and blankets)?: A Little Difficulty moving from lying on back to sitting on the side of the bed? : Total Difficulty sitting down on and standing up from a chair with arms (e.g., wheelchair, bedside commode, etc,.)?: Total Help needed moving to and from a bed to chair (including a wheelchair)?: A Little Help needed walking in hospital room?: A Little Help needed climbing 3-5 steps with a railing? : A Little 6 Click Score: 14    End of Session Equipment Utilized During Treatment: Gait belt Activity Tolerance: Patient limited by fatigue Patient left: in chair;with call bell/phone within reach   PT Visit Diagnosis: Other abnormalities of gait and mobility (R26.89);Pain Pain - Right/Left: Right Pain - part of body: Knee     Time: 1047-1110 PT Time Calculation (min) (ACUTE ONLY): 23 min  Charges:  $Gait Training: 8-22 mins $Therapeutic Exercise: 8-22 mins                    G Codes:       Lydia Graham, [place] Pager [phone] 10/24/2016    [name] 10/24/2016, 12:43 PM

## 2016-10-24 NOTE — Progress Notes (Addendum)
Physical Therapy Treatment Patient Details Name: Lydia Graham MRN: [id number] DOB: 1936-01-13 Today's Date: 10/24/2016    History of Present Illness Pt is a 81 y/o female s/p elective R TKA secondary to R knee OA. PMH includes HTN and back surgery.     PT Comments    Pt felt poorly in PM session.  Pt c/o nausea, lightheadedness, and feeling groggy which she attributes to having a pain shot earlier. "I'd rather be in pain than feel like this."  Pt ambulated short distance to bed.  Reviewed importance of getting up with nursing later in the day to [facility], etc and encouraged to perform LE therex.  Con't to recommend HHPT.   Follow Up Recommendations  Home health PT;Supervision/Assistance - 24 hour     Equipment Recommendations  None recommended by PT    Recommendations for Other Services       Precautions / Restrictions Precautions Precautions: Knee Precaution Booklet Issued: Yes (comment) Precaution Comments: reviewed therex Restrictions Weight Bearing Restrictions: Yes RLE Weight Bearing: Weight bearing as tolerated    Mobility  Bed Mobility Overal bed mobility: Needs Assistance Bed Mobility: Sit to Supine       Sit to supine: Min assist   General bed mobility comments: MIN A for R LE management  Transfers Overall transfer level: Needs assistance Equipment used: Rolling walker (2 wheeled) Transfers: Sit to/from Stand Sit to Stand: Min assist         General transfer comment: MIN A to power up and cues for hand placement  Ambulation/Gait Ambulation/Gait assistance: Min assist Ambulation Distance (Feet): 5 Feet Assistive device: Rolling walker (2 wheeled) Gait Pattern/deviations: Step-to pattern;Decreased stance time - right;Trunk flexed Gait velocity: Decreased   General Gait Details: Pt ambulated short distance from recliner to bed with A for turning    Stairs            Wheelchair Mobility    Modified Rankin (Stroke Patients Only)        Balance Overall balance assessment: Needs assistance Sitting-balance support: No upper extremity supported;Feet supported Sitting balance-Leahy Scale: Good     Standing balance support: Bilateral upper extremity supported Standing balance-Leahy Scale: Poor Standing balance comment: Requires RW for support                            Cognition Arousal/Alertness: Awake/alert Behavior During Therapy: WFL for tasks assessed/performed Overall Cognitive Status: Within Functional Limits for tasks assessed                                        Exercises     General Comments General comments (skin integrity, edema, etc.): Pt back on o2 due to de-sat after pain shot (per sister)      Pertinent Vitals/Pain Pain Assessment: Faces Pain Score: 5  Faces Pain Scale: Hurts little more Pain Location: R knee Pain Descriptors / Indicators: Grimacing Pain Intervention(s): Limited activity within patient's tolerance;Monitored during session;Ice applied;Repositioned    Home Living                      Prior Function            PT Goals (current goals can now be found in the care plan section) Acute Rehab PT Goals Patient Stated Goal: to go home  PT Goal Formulation: With patient Time For  Goal Achievement: 10/30/16 Potential to Achieve Goals: Good Progress towards PT goals: Not progressing toward goals - comment (pt feeling nauseous)    Frequency    7X/week      PT Plan Current plan remains appropriate    Co-evaluation              AM-PAC PT "6 Clicks" Daily Activity  Outcome Measure  Difficulty turning over in bed (including adjusting bedclothes, sheets and blankets)?: A Little Difficulty moving from lying on back to sitting on the side of the bed? : Total Difficulty sitting down on and standing up from a chair with arms (e.g., wheelchair, bedside commode, etc,.)?: Total Help needed moving to and from a bed to chair (including a  wheelchair)?: A Little Help needed walking in hospital room?: A Little Help needed climbing 3-5 steps with a railing? : A Little 6 Click Score: 14    End of Session Equipment Utilized During Treatment: Gait belt;Oxygen Activity Tolerance: Patient limited by fatigue;Treatment limited secondary to medical complications (Comment) (feeling nauseous, dizzy) Patient left: in bed;with call bell/phone within reach;with family/visitor present Nurse Communication: Mobility status;Other (comment) (pt feeling poorly) PT Visit Diagnosis: Other abnormalities of gait and mobility (R26.89);Pain Pain - Right/Left: Right Pain - part of body: Knee     Time: 7903-8333 PT Time Calculation (min) (ACUTE ONLY): 20 min  Charges:  $Therapeutic Activity: 8-22 mins                    G Codes:       Kimmy Parish L. Tamala Julian, [place] Pager [phone] 10/24/2016     [name] 10/24/2016, 1:48 PM

## 2016-10-25 MED ORDER — ASPIRIN EC 325 MG PO TBEC: 325.0000 mg | DELAYED_RELEASE_TABLET | Freq: Every day | ORAL | 0 refills | Status: DC

## 2016-10-25 MED ORDER — OXYCODONE-ACETAMINOPHEN 5-325 MG PO TABS: 1.0000 | ORAL_TABLET | ORAL | 0 refills | Status: DC | PRN

## 2016-10-25 MED ORDER — METHOCARBAMOL 500 MG PO TABS: 500.0000 mg | ORAL_TABLET | Freq: Three times a day (TID) | ORAL | 0 refills | Status: DC

## 2016-10-25 NOTE — Discharge Summary (Signed)
Discharge Diagnoses:  Active Problems:   Total knee replacement status, right   Surgeries: Procedure(s): RIGHT TOTAL KNEE ARTHROPLASTY on 10/23/2016    Consultants:   Discharged Condition: Improved  Hospital Course: Lydia Graham is an 81 y.o. female who was admitted 10/23/2016 with a chief complaint of osteoarthritis right knee, with a final diagnosis of Osteoarthritis Right Knee.  Patient was brought to the operating room on 10/23/2016 and underwent Procedure(s): RIGHT TOTAL KNEE ARTHROPLASTY.    Patient was given perioperative antibiotics: Anti-infectives    Start     Dose/Rate Route Frequency Ordered Stop   10/23/16 1400  ceFAZolin (ANCEF) IVPB 1 g/50 mL premix     1 g 100 mL/hr over 30 Minutes Intravenous Every 6 hours 10/23/16 1219 10/23/16 2238   10/23/16 0800  ceFAZolin (ANCEF) IVPB 2g/100 mL premix     2 g 200 mL/hr over 30 Minutes Intravenous To [facility]Stay [facility] 10/22/16 0800 10/23/16 0835    .  Patient was given sequential compression devices, early ambulation, and aspirin for DVT prophylaxis.  Recent vital signs: Patient Vitals for the past 24 hrs:  BP Temp Temp src Pulse Resp SpO2  10/24/16 2136 (!) 157/51 99.4 F (37.4 C) Oral 72 18 94 %  10/24/16 1440 (!) 145/59 97.7 F (36.5 C) Oral 67 17 97 %  10/24/16 0645 140/70 97.5 F (36.4 C) Oral 68 16 97 %  .  Recent laboratory studies: No results found.  Discharge Medications:   Allergies as of 10/25/2016      Reactions   Rosuvastatin Other (See Comments)   MYALGIA's Muscle pain      Medication List    TAKE these medications   amLODipine 10 MG tablet Commonly known as:  NORVASC Take 10 mg by mouth daily at 12 noon.   aspirin EC 325 MG tablet Take 1 tablet (325 mg total) by mouth daily.   butalbital-acetaminophen-caffeine 50-325-40 MG tablet Commonly known as:  FIORICET, ESGIC Take 1 tablet by mouth every 6 (six) hours as needed for headache or migraine.   calcium-vitamin D 500-200 MG-UNIT  tablet Commonly known as:  OSCAL WITH D Take 1 tablet by mouth daily.   carvedilol 25 MG tablet Commonly known as:  COREG Take 25 mg by mouth 2 (two) times daily with a meal.   estradiol 0.5 MG tablet Commonly known as:  ESTRACE Take 0.5 mg by mouth daily.   Fish Oil 1000 MG Caps Take 1,000 mg by mouth daily.   fluticasone 50 MCG/ACT nasal spray Commonly known as:  FLONASE Place 1 spray into both nostrils daily.   losartan 100 MG tablet Commonly known as:  COZAAR Take 100 mg by mouth daily at 12 noon.   magnesium hydroxide 400 MG/5ML suspension Commonly known as:  MILK OF MAGNESIA Take 30 mLs by mouth daily as needed for mild constipation.   methocarbamol 500 MG tablet Commonly known as:  ROBAXIN Take 1 tablet (500 mg total) by mouth 3 (three) times daily.   multivitamin with minerals tablet Take 1 tablet by mouth daily.   naproxen sodium 220 MG tablet Commonly known as:  ANAPROX Take 220 mg by mouth daily.   oxyCODONE-acetaminophen 5-325 MG tablet Commonly known as:  ROXICET Take 1 tablet by mouth every 4 (four) hours as needed for severe pain.   pantoprazole 40 MG tablet Commonly known as:  PROTONIX Take 40 mg by mouth daily before breakfast.   VITAMIN B 12 PO Take 1 tablet by mouth daily.  Diagnostic Studies: Xr C-arm No Report  Result Date: 09/25/2016 Please see Notes or Procedures tab for imaging impression.   Patient benefited maximally from their hospital stay and there were no complications.     Disposition: Final discharge disposition not confirmed Discharge Instructions    Call MD / Call 911    Complete by:  As directed    If you experience chest pain or shortness of breath, CALL 911 and be transported to the hospital emergency room.  If you develope a fever above 101 F, pus (white drainage) or increased drainage or redness at the wound, or calf pain, call your surgeon's office.   Constipation Prevention    Complete by:  As directed     Drink plenty of fluids.  Prune juice may be helpful.  You may use a stool softener, such as Colace (over the counter) 100 mg twice a day.  Use MiraLax (over the counter) for constipation as needed.   Diet - low sodium heart healthy    Complete by:  As directed    Increase activity slowly as tolerated    Complete by:  As directed    Weight bearing as tolerated    Complete by:  As directed      Follow-up Information    Follow up Follow up in 1 week(s).        [facility] Follow up.   Specialty:  [place] Why:  A representative from [facility] will contact you to arrange strt date and time for your therapy. Contact information: [address] [place] [address] [phone]            Signed: [name] 10/25/2016, 6:37 AM

## 2016-10-25 NOTE — Progress Notes (Signed)
Occupational Therapy Treatment Patient Details Name: Lydia Graham MRN: [id number] DOB: 1936/01/31 Today's Date: 10/25/2016    History of present illness Pt is a 81 y/o female s/p elective R TKA secondary to R knee OA. PMH includes HTN and back surgery.    OT comments  Pt daughter and sister present during session. Pt educated on LB adls and shower transfer this session. Pt demonstrates poor recall for transfer sequence at this time but family has 100% correct recall. Pt will have 24/7 (A).   Follow Up Recommendations  Home health OT;Supervision - Intermittent;Other (comment)    Equipment Recommendations  3 in 1 bedside commode    Recommendations for Other Services      Precautions / Restrictions Precautions Precautions: Knee Precaution Comments: reviewed with pt and family Restrictions RLE Weight Bearing: Weight bearing as tolerated       Mobility Bed Mobility Overal bed mobility: Needs Assistance Bed Mobility: Supine to Sit     Supine to sit: Min assist     General bed mobility comments: declined OOB at this time  Transfers Overall transfer level: Needs assistance Equipment used: Rolling walker (2 wheeled) Transfers: Sit to/from Stand Sit to Stand: Supervision;Min guard         General transfer comment: pt observed with PT starting session    Balance     Sitting balance-Leahy Scale: Good     Standing balance support: During functional activity Standing balance-Leahy Scale: Fair Standing balance comment: Pt able to pull up pants at toilet                           ADL either performed or assessed with clinical judgement   ADL                       Lower Body Dressing: Minimal assistance           Tub/ Shower Transfer: Minimal assistance     General ADL Comments: pt returning to bed on arrival. Sister the retired PA assisting. pt educated on use of reacher, sock aide and choice of shoes. pt plans to borrow all AE from  oldest sister upon d/c. Ot providing demo of shower transfer and pt declines x2 to return demo the task. Teach back used several times during session to have pt to verbalize sequence for transfer.  Pt observed for step transfer after OT session this afternoon to assess recall. Pt completed task 100% correct recall of sequence.     Vision       Perception     Praxis      Cognition Arousal/Alertness: Awake/alert Behavior During Therapy: WFL for tasks assessed/performed Overall Cognitive Status: Within Functional Limits for tasks assessed                                          Exercises Exercises: Total Joint Total Joint Exercises Ankle Circles/Pumps: AROM;Both;10 reps Quad Sets: AROM;Right;10 reps Towel Squeeze: AROM;Both;10 reps Heel Slides: AAROM;Right;10 reps Hip ABduction/ADduction: AROM;AAROM;Right;10 reps   Shoulder Instructions       General Comments Pt tends to keep R LE ER.  Pt and family state she has always been like that    Pertinent Vitals/ Pain       Pain Assessment: Faces Pain Score: 4  Faces Pain Scale: Hurts a little bit Pain  Location: R knee Pain Descriptors / Indicators: Operative site guarding;Guarding Pain Intervention(s): Limited activity within patient's tolerance;Monitored during session;Premedicated before session;Repositioned  Home Living                                          Prior Functioning/Environment              Frequency  Min 2X/week        Progress Toward Goals  OT Goals(current goals can now be found in the care plan section)  Progress towards OT goals: Progressing toward goals  Acute Rehab OT Goals Patient Stated Goal: to go home  OT Goal Formulation: With patient/family Time For Goal Achievement: 10/31/16 Potential to Achieve Goals: Good ADL Goals Pt Will Perform Grooming: with supervision;standing Pt Will Perform Lower Body Bathing: with min guard assist;with adaptive  equipment;sit to/from stand Pt Will Perform Lower Body Dressing: with min guard assist;with adaptive equipment;sit to/from stand Pt Will Transfer to Toilet: with min guard assist;ambulating Pt Will Perform Toileting - Clothing Manipulation and hygiene: with min guard assist;sit to/from stand Pt Will Perform Tub/Shower Transfer: Shower transfer;with supervision;ambulating;3 in 1;rolling walker Additional ADL Goal #1: Pt will complete bed mobility at mod I level to prepare for OOB ADLs.   Plan Discharge plan remains appropriate    Co-evaluation                 AM-PAC PT "6 Clicks" Daily Activity     Outcome Measure   Help from another person eating meals?: None Help from another person taking care of personal grooming?: A Little Help from another person toileting, which includes using toliet, bedpan, or urinal?: A Little Help from another person bathing (including washing, rinsing, drying)?: A Little Help from another person to put on and taking off regular upper body clothing?: A Little Help from another person to put on and taking off regular lower body clothing?: A Little 6 Click Score: 19    End of Session    OT Visit Diagnosis: Unsteadiness on feet (R26.81);Pain Pain - part of body: Knee   Activity Tolerance Patient tolerated treatment well   Patient Left in bed;with call bell/phone within reach;with family/visitor present   Nurse Communication Mobility status;Precautions        Time: 3128-1188 OT Time Calculation (min): 13 min  Charges: OT General Charges $OT Visit: 1 Procedure OT Treatments $Self Care/Home Management : 8-22 mins   Jeri Modena   OTR/L Pager: [phone] Office: [phone] .    Parke Poisson B 10/25/2016, 2:05 PM

## 2016-10-25 NOTE — Progress Notes (Signed)
Physical Therapy Treatment Patient Details Name: Lydia Graham MRN: [id number] DOB: 28-May-1936 Today's Date: 10/25/2016    History of Present Illness Pt is a 81 y/o female s/p elective R TKA secondary to R knee OA. PMH includes HTN and back surgery.     PT Comments    Pt able to increase ambulation this session and performed stair training with sister's A.  Pt able to recall proper step pattern.  Con't to recommend HHPT.  Anticipate d/c later today.   Follow Up Recommendations  Home health PT;Supervision/Assistance - 24 hour     Equipment Recommendations  None recommended by PT    Recommendations for Other Services       Precautions / Restrictions Precautions Precautions: Knee Precaution Comments: reviewed with pt and family Restrictions RLE Weight Bearing: Weight bearing as tolerated    Mobility  Bed Mobility Overal bed mobility: Needs Assistance Bed Mobility: Supine to Sit     Supine to sit: Min assist     General bed mobility comments: Pt coming to EOB with sister's A for R LE upon arrival  Transfers Overall transfer level: Needs assistance Equipment used: Rolling walker (2 wheeled) Transfers: Sit to/from Stand Sit to Stand: Supervision;Min guard         General transfer comment: improved technique.  Stood from bed, recliner, and toilet  Ambulation/Gait Ambulation/Gait assistance: Min guard Ambulation Distance (Feet): 20 Feet (plus 30) Assistive device: Rolling walker (2 wheeled) Gait Pattern/deviations: Step-to pattern;Decreased weight shift to right;Decreased stance time - right Gait velocity: Decreased   General Gait Details: Pt maintaining increased R knee flex during gait and facilitated improved posture with heel to toe gait pattern   Stairs Stairs: Yes   Stair Management: One rail Left;Step to pattern;Forwards (with HHA of sister) Number of Stairs: 2 General stair comments: practiced stairs with 1 rail and sister's A.  Pt able to recall  proper sequence.  her L shoe was loose causing difficulty on the stairs and pt's sister is going to put insert back into it.  Wheelchair Mobility    Modified Rankin (Stroke Patients Only)       Balance     Sitting balance-Leahy Scale: Good     Standing balance support: During functional activity Standing balance-Leahy Scale: Fair Standing balance comment: Pt able to pull up pants at toilet                            Cognition Arousal/Alertness: Awake/alert Behavior During Therapy: WFL for tasks assessed/performed Overall Cognitive Status: Within Functional Limits for tasks assessed                                        Exercises     General Comments General comments (skin integrity, edema, etc.): Pt tends to keep R LE ER.  Pt and family state she has always been like that      Pertinent Vitals/Pain Pain Assessment: Faces Pain Score: 4  Faces Pain Scale: Hurts a little bit Pain Location: R knee Pain Descriptors / Indicators: Operative site guarding;Guarding Pain Intervention(s): Limited activity within patient's tolerance;Monitored during session    Home Living                      Prior Function            PT Goals (current  goals can now be found in the care plan section) Acute Rehab PT Goals Patient Stated Goal: to go home  PT Goal Formulation: With patient Time For Goal Achievement: 10/30/16 Potential to Achieve Goals: Good Progress towards PT goals: Progressing toward goals    Frequency    7X/week      PT Plan Current plan remains appropriate    Co-evaluation              AM-PAC PT "6 Clicks" Daily Activity  Outcome Measure  Difficulty turning over in bed (including adjusting bedclothes, sheets and blankets)?: A Little Difficulty moving from lying on back to sitting on the side of the bed? : Total Difficulty sitting down on and standing up from a chair with arms (e.g., wheelchair, bedside commode,  etc,.)?: A Little Help needed moving to and from a bed to chair (including a wheelchair)?: A Little Help needed walking in hospital room?: A Little Help needed climbing 3-5 steps with a railing? : A Little 6 Click Score: 16    End of Session Equipment Utilized During Treatment: Gait belt Activity Tolerance: Patient tolerated treatment well Patient left: in chair;with call bell/phone within reach;with family/visitor present Nurse Communication: Mobility status PT Visit Diagnosis: Other abnormalities of gait and mobility (R26.89);Pain Pain - Right/Left: Right Pain - part of body: Knee     Time: 1317-1340 PT Time Calculation (min) (ACUTE ONLY): 23 min  Charges:  $Gait Training: 23-37 mins                     G Codes:       [name], Brennen Pager [phone] 10/25/2016    [name] 10/25/2016, 1:56 PM

## 2016-10-25 NOTE — Progress Notes (Signed)
Physical Therapy Treatment Patient Details Name: Lydia Graham MRN: [id number] DOB: 11-13-1935 Today's Date: 10/25/2016    History of Present Illness Pt is a 81 y/o female s/p elective R TKA secondary to R knee OA. PMH includes HTN and back surgery.     PT Comments    Initiated stair training with pt and family.  Pt continues to have limited gait distances.  Recommend 24 hour A and HHPT.  Sister (retired Therapist, sports) will be with pt and is very involved with her care.   Follow Up Recommendations  Home health PT;Supervision/Assistance - 24 hour     Equipment Recommendations  None recommended by PT    Recommendations for Other Services       Precautions / Restrictions Precautions Precautions: Knee Restrictions RLE Weight Bearing: Weight bearing as tolerated    Mobility  Bed Mobility Overal bed mobility: Needs Assistance Bed Mobility: Supine to Sit     Supine to sit: Min assist     General bed mobility comments: Pt able to come to long sitting from bed flat without rail, but continues to need MIN A for R LE  Transfers Overall transfer level: Needs assistance Equipment used: Rolling walker (2 wheeled) Transfers: Sit to/from Stand Sit to Stand: Min guard         General transfer comment: min/guard for safety  Ambulation/Gait Ambulation/Gait assistance: Min guard Ambulation Distance (Feet): 20 Feet (plus 5' and 5') Assistive device: Rolling walker (2 wheeled) Gait Pattern/deviations: Step-to pattern;Decreased stance time - right;Trunk flexed;Antalgic Gait velocity: Decreased   General Gait Details: Pt ambulated from bed > recliner to go to stairs.  Ambulated in PT gym 5' and then 20' after stairs.   Stairs Stairs: Yes   Stair Management: One rail Left;Step to pattern;With cane (with HHA) Number of Stairs: 2 General stair comments: Attempted stairs with rail on L and cane, but pt didn't feel stable so performed with HHA.  MOD A for ascent and MIN A for descent.   Sister and daughter present and educated on safety.  Wheelchair Mobility    Modified Rankin (Stroke Patients Only)       Balance     Sitting balance-Leahy Scale: Good     Standing balance support: Bilateral upper extremity supported Standing balance-Leahy Scale: Poor Standing balance comment: Requires RW for support                            Cognition Arousal/Alertness: Awake/alert Behavior During Therapy: WFL for tasks assessed/performed Overall Cognitive Status: Within Functional Limits for tasks assessed                                        Exercises Total Joint Exercises Ankle Circles/Pumps: AROM;Both;10 reps Quad Sets: AROM;Right;10 reps Towel Squeeze: AROM;Both;10 reps Heel Slides: AAROM;Right;10 reps Hip ABduction/ADduction: AROM;AAROM;Right;10 reps    General Comments General comments (skin integrity, edema, etc.): Pt tends to keep R LE ER.  Pt and family state she has always been like that      Pertinent Vitals/Pain Pain Assessment: 0-10 Pain Score: 4  Pain Location: R knee Pain Descriptors / Indicators: Operative site guarding;Guarding Pain Intervention(s): Limited activity within patient's tolerance;Monitored during session;Premedicated before session    Home Living                      Prior  Function            PT Goals (current goals can now be found in the care plan section) Acute Rehab PT Goals Patient Stated Goal: to go home  PT Goal Formulation: With patient Time For Goal Achievement: 10/30/16 Potential to Achieve Goals: Good Progress towards PT goals: Progressing toward goals    Frequency    7X/week      PT Plan Current plan remains appropriate    Co-evaluation              AM-PAC PT "6 Clicks" Daily Activity  Outcome Measure  Difficulty turning over in bed (including adjusting bedclothes, sheets and blankets)?: A Little Difficulty moving from lying on back to sitting on the side  of the bed? : Total Difficulty sitting down on and standing up from a chair with arms (e.g., wheelchair, bedside commode, etc,.)?: Total Help needed moving to and from a bed to chair (including a wheelchair)?: A Little Help needed walking in hospital room?: A Little Help needed climbing 3-5 steps with a railing? : A Lot 6 Click Score: 13    End of Session Equipment Utilized During Treatment: Gait belt Activity Tolerance: Patient limited by fatigue Patient left: in chair;with call bell/phone within reach;with family/visitor present Nurse Communication: Mobility status PT Visit Diagnosis: Other abnormalities of gait and mobility (R26.89);Pain Pain - Right/Left: Right Pain - part of body: Knee     Time: 7741-4239 PT Time Calculation (min) (ACUTE ONLY): 40 min  Charges:  $Gait Training: 23-37 mins $Therapeutic Exercise: 8-22 mins                    G Codes:       Ineta Sinning L. Tamala Julian, Aanchal Pager [phone] 10/25/2016    [name] 10/25/2016, 11:57 AM

## 2016-10-26 DIAGNOSIS — I10 Essential (primary) hypertension: Secondary | ICD-10-CM | POA: Diagnosis not present

## 2016-10-26 DIAGNOSIS — Z96651 Presence of right artificial knee joint: Secondary | ICD-10-CM | POA: Diagnosis not present

## 2016-10-26 DIAGNOSIS — Z471 Aftercare following joint replacement surgery: Secondary | ICD-10-CM | POA: Diagnosis not present

## 2016-10-28 ENCOUNTER — Telehealth (INDEPENDENT_AMBULATORY_CARE_PROVIDER_SITE_OTHER): Payer: Self-pay | Admitting: Orthopedic Surgery

## 2016-10-28 DIAGNOSIS — Z96651 Presence of right artificial knee joint: Secondary | ICD-10-CM | POA: Diagnosis not present

## 2016-10-28 DIAGNOSIS — Z471 Aftercare following joint replacement surgery: Secondary | ICD-10-CM | POA: Diagnosis not present

## 2016-10-28 DIAGNOSIS — I10 Essential (primary) hypertension: Secondary | ICD-10-CM | POA: Diagnosis not present

## 2016-10-28 NOTE — Telephone Encounter (Signed)
MICHELE WITH [facility] REQUEST VERBAL FOR 1X WK 1 AND 2X WEEK 2  [phone]

## 2016-10-28 NOTE — Telephone Encounter (Signed)
I called and left voicemail for patient to advise ok for verbal ok.

## 2016-10-29 ENCOUNTER — Telehealth (INDEPENDENT_AMBULATORY_CARE_PROVIDER_SITE_OTHER): Payer: Self-pay | Admitting: Orthopedic Surgery

## 2016-10-29 NOTE — Telephone Encounter (Signed)
PT CALLED AND WANTS TO KNOW WHY SHE NEEDS AN APPT WITH HER PRIMARY CARE DR.SHE STATED WE ADVISED HER TO GO. IF SHE DOESN'T NEED TO GO SHE IS GOING TO CANCEL.  [place]  [phone]

## 2016-10-30 DIAGNOSIS — Z96651 Presence of right artificial knee joint: Secondary | ICD-10-CM | POA: Diagnosis not present

## 2016-10-30 DIAGNOSIS — I10 Essential (primary) hypertension: Secondary | ICD-10-CM | POA: Diagnosis not present

## 2016-10-30 DIAGNOSIS — Z471 Aftercare following joint replacement surgery: Secondary | ICD-10-CM | POA: Diagnosis not present

## 2016-10-31 ENCOUNTER — Encounter (INDEPENDENT_AMBULATORY_CARE_PROVIDER_SITE_OTHER): Payer: Self-pay | Admitting: Orthopedic Surgery

## 2016-10-31 ENCOUNTER — Ambulatory Visit (INDEPENDENT_AMBULATORY_CARE_PROVIDER_SITE_OTHER): Payer: Medicare Other | Admitting: Family

## 2016-10-31 ENCOUNTER — Ambulatory Visit (INDEPENDENT_AMBULATORY_CARE_PROVIDER_SITE_OTHER): Payer: Medicare Other

## 2016-10-31 VITALS — Ht 67.0 in | Wt 157.0 lb

## 2016-10-31 DIAGNOSIS — Z96651 Presence of right artificial knee joint: Secondary | ICD-10-CM | POA: Diagnosis not present

## 2016-10-31 DIAGNOSIS — M1712 Unilateral primary osteoarthritis, left knee: Secondary | ICD-10-CM

## 2016-10-31 MED ORDER — METHOCARBAMOL 500 MG PO TABS: 500.0000 mg | ORAL_TABLET | Freq: Three times a day (TID) | ORAL | 0 refills | Status: DC

## 2016-10-31 MED ORDER — ACETAMINOPHEN-CODEINE #3 300-30 MG PO TABS: 1.0000 | ORAL_TABLET | ORAL | 0 refills | Status: DC | PRN

## 2016-10-31 NOTE — Telephone Encounter (Signed)
I called and spoke with patient advised I'm not sure why she needs to go to medical doctor. That as far as I knew she did not. She will follow up in office for total knee replacement.

## 2016-10-31 NOTE — Progress Notes (Signed)
   Post-Op Visit Note   Patient: Lydia Graham           Date of Birth: [birth year]           MRN: [id number] Visit Date: 10/31/2016 PCP: Glenda Chroman, MD  Chief Complaint:  Chief Complaint  Patient presents with  . Right Knee - Routine Post Op    10/23/16 right total knee replacement     HPI:  The patient is a 81 year old woman who presents today one week status post right total knee replacement. She is residing at home with a friend for support. Is doing home health physical therapy. Feels is progressing well. Minimal pain. Is taking Tylenol No. 3 every 4 hours for pain as well as Robaxin.    Ortho Exam Incision well proximal made with staples is healing well. Staples were harvested today with without incident there is no gaping no erythema no drainage no sign of infection. Lacks 10 degrees of extension. Flexion just past 90.   Visit Diagnoses:  1. H/O total knee replacement, right   2. Primary osteoarthritis of left knee     Plan: We'll continue with physical therapy. Follow-up in office in 2 more weeks.  Follow-Up Instructions: Return in about 2 weeks (around 11/14/2016).   Imaging: Xr Knee 1-2 Views Right  Result Date: 10/31/2016 Radiographs of the right knee show stable alignment of total knee arthroplasty. getting features.   Orders:  Orders Placed This Encounter  Procedures  . XR Knee 1-2 Views Right   Meds ordered this encounter  Medications  . acetaminophen-codeine (TYLENOL #3) 300-30 MG tablet    Sig: Take 1 tablet by mouth every 4 (four) hours as needed for moderate pain.    Dispense:  60 tablet    Refill:  0  . methocarbamol (ROBAXIN) 500 MG tablet    Sig: Take 1 tablet (500 mg total) by mouth 3 (three) times daily.    Dispense:  30 tablet    Refill:  0     PMFS History: Patient Active Problem List   Diagnosis Date Noted  . Total knee replacement status, right 10/23/2016  . Unilateral primary osteoarthritis, right knee 07/13/2016   Past  Medical History:  Diagnosis Date  . Arthritis    oa  . GERD (gastroesophageal reflux disease)   . Headache   . Hypertension     No family history on file.  Past Surgical History:  Procedure Laterality Date  . ABDOMINAL HYSTERECTOMY    . APPENDECTOMY    . BACK SURGERY    . TONSILLECTOMY    . TOTAL KNEE ARTHROPLASTY Right 10/23/2016   Procedure: RIGHT TOTAL KNEE ARTHROPLASTY;  Surgeon: [name], MD;  Location: [facility];  Service: Orthopedics;  Laterality: Right;   Social History   Occupational History  . Not on file.   Social History Main Topics  . Smoking status: Never Smoker  . Smokeless tobacco: Never Used  . Alcohol use No  . Drug use: No  . Sexual activity: Not on file

## 2016-11-04 DIAGNOSIS — Z471 Aftercare following joint replacement surgery: Secondary | ICD-10-CM | POA: Diagnosis not present

## 2016-11-04 DIAGNOSIS — Z96651 Presence of right artificial knee joint: Secondary | ICD-10-CM | POA: Diagnosis not present

## 2016-11-04 DIAGNOSIS — I10 Essential (primary) hypertension: Secondary | ICD-10-CM | POA: Diagnosis not present

## 2016-11-07 ENCOUNTER — Telehealth (INDEPENDENT_AMBULATORY_CARE_PROVIDER_SITE_OTHER): Payer: Self-pay | Admitting: Orthopedic Surgery

## 2016-11-07 ENCOUNTER — Other Ambulatory Visit (INDEPENDENT_AMBULATORY_CARE_PROVIDER_SITE_OTHER): Payer: Self-pay

## 2016-11-07 DIAGNOSIS — Z96651 Presence of right artificial knee joint: Secondary | ICD-10-CM | POA: Diagnosis not present

## 2016-11-07 DIAGNOSIS — Z471 Aftercare following joint replacement surgery: Secondary | ICD-10-CM | POA: Diagnosis not present

## 2016-11-07 DIAGNOSIS — I10 Essential (primary) hypertension: Secondary | ICD-10-CM | POA: Diagnosis not present

## 2016-11-07 NOTE — Telephone Encounter (Signed)
Order written and faxed with demo sheet to number provided by pt. Request that office contact pt direct to sch appt

## 2016-11-07 NOTE — Telephone Encounter (Signed)
PT CALLED AND STATED SHE WANTS TO TO HAVE OP  THERAPY AT [facility] IF YOU CAN FAX IT TO [phone]  [phone]

## 2016-11-11 DIAGNOSIS — M25561 Pain in right knee: Secondary | ICD-10-CM | POA: Diagnosis not present

## 2016-11-11 DIAGNOSIS — Z96651 Presence of right artificial knee joint: Secondary | ICD-10-CM | POA: Diagnosis not present

## 2016-11-13 DIAGNOSIS — M25561 Pain in right knee: Secondary | ICD-10-CM | POA: Diagnosis not present

## 2016-11-13 DIAGNOSIS — Z96651 Presence of right artificial knee joint: Secondary | ICD-10-CM | POA: Diagnosis not present

## 2016-11-14 ENCOUNTER — Ambulatory Visit (INDEPENDENT_AMBULATORY_CARE_PROVIDER_SITE_OTHER): Payer: Medicare Other | Admitting: Family

## 2016-11-14 DIAGNOSIS — Z96651 Presence of right artificial knee joint: Secondary | ICD-10-CM

## 2016-11-14 NOTE — Progress Notes (Signed)
   Post-Op Visit Note   Patient: Lydia Graham           Date of Birth: Aug 22, 1935           MRN: [id number] Visit Date: 11/14/2016 PCP: Glenda Chroman, MD  Chief Complaint: No chief complaint on file.   HPI:  The patient is 81 year old woman who presents today 22 days status post right total knee replacement. She is pleased with her progress. Continues to do physical therapy has had one outpatient visit thus far. Does complain of a little bit of numbness in her knee. Overall pleased. Has plans to kneel and we later this week. Ambulating with a cane.    Ortho Exam Incision is well-healed. There is minimal swelling to the knee there is no surrounding erythema no open area and no drainage no odor no sign of infection. Lacks 5 of full extension. Has flexion to 115  Visit Diagnoses:  1. Total knee replacement status, right     Plan: Continue with physical therapy. continue daily incision cleansing. Follow up in office in 4 weeks  Follow-Up Instructions: Return in about 4 weeks (around 12/12/2016).   Imaging: No results found.  Orders:  No orders of the defined types were placed in this encounter.  No orders of the defined types were placed in this encounter.    PMFS History: Patient Active Problem List   Diagnosis Date Noted  . Total knee replacement status, right 10/23/2016  . Unilateral primary osteoarthritis, right knee 07/13/2016   Past Medical History:  Diagnosis Date  . Arthritis    oa  . GERD (gastroesophageal reflux disease)   . Headache   . Hypertension     No family history on file.  Past Surgical History:  Procedure Laterality Date  . ABDOMINAL HYSTERECTOMY    . APPENDECTOMY    . BACK SURGERY    . TONSILLECTOMY    . TOTAL KNEE ARTHROPLASTY Right 10/23/2016   Procedure: RIGHT TOTAL KNEE ARTHROPLASTY;  Surgeon: [name], MD;  Location: [place] [facility];  Service: Orthopedics;  Laterality: Right;   Social History   Occupational History  . Not on file.    Social History Main Topics  . Smoking status: Never Smoker  . Smokeless tobacco: Never Used  . Alcohol use No  . Drug use: No  . Sexual activity: Not on file

## 2016-11-20 DIAGNOSIS — Z96651 Presence of right artificial knee joint: Secondary | ICD-10-CM | POA: Diagnosis not present

## 2016-11-20 DIAGNOSIS — M25561 Pain in right knee: Secondary | ICD-10-CM | POA: Diagnosis not present

## 2016-11-22 DIAGNOSIS — M25561 Pain in right knee: Secondary | ICD-10-CM | POA: Diagnosis not present

## 2016-11-22 DIAGNOSIS — Z96651 Presence of right artificial knee joint: Secondary | ICD-10-CM | POA: Diagnosis not present

## 2016-11-23 ENCOUNTER — Encounter (HOSPITAL_COMMUNITY): Payer: Self-pay | Admitting: Orthopedic Surgery

## 2016-11-23 NOTE — Addendum Note (Signed)
Addendum  created 11/23/16 0905 by Beverlyn Mcginness, MD   Sign clinical note    

## 2016-11-25 DIAGNOSIS — M25561 Pain in right knee: Secondary | ICD-10-CM | POA: Diagnosis not present

## 2016-11-25 DIAGNOSIS — Z96651 Presence of right artificial knee joint: Secondary | ICD-10-CM | POA: Diagnosis not present

## 2016-11-28 DIAGNOSIS — Z96651 Presence of right artificial knee joint: Secondary | ICD-10-CM | POA: Diagnosis not present

## 2016-11-28 DIAGNOSIS — R609 Edema, unspecified: Secondary | ICD-10-CM | POA: Diagnosis not present

## 2016-11-28 DIAGNOSIS — M25561 Pain in right knee: Secondary | ICD-10-CM | POA: Diagnosis not present

## 2016-11-28 DIAGNOSIS — I10 Essential (primary) hypertension: Secondary | ICD-10-CM | POA: Diagnosis not present

## 2016-11-28 DIAGNOSIS — Z299 Encounter for prophylactic measures, unspecified: Secondary | ICD-10-CM | POA: Diagnosis not present

## 2016-11-28 DIAGNOSIS — Z6824 Body mass index (BMI) 24.0-24.9, adult: Secondary | ICD-10-CM | POA: Diagnosis not present

## 2016-11-28 DIAGNOSIS — I8390 Asymptomatic varicose veins of unspecified lower extremity: Secondary | ICD-10-CM | POA: Diagnosis not present

## 2016-12-02 DIAGNOSIS — Z96651 Presence of right artificial knee joint: Secondary | ICD-10-CM | POA: Diagnosis not present

## 2016-12-02 DIAGNOSIS — M25561 Pain in right knee: Secondary | ICD-10-CM | POA: Diagnosis not present

## 2016-12-05 DIAGNOSIS — Z96651 Presence of right artificial knee joint: Secondary | ICD-10-CM | POA: Diagnosis not present

## 2016-12-05 DIAGNOSIS — B079 Viral wart, unspecified: Secondary | ICD-10-CM | POA: Diagnosis not present

## 2016-12-05 DIAGNOSIS — M25561 Pain in right knee: Secondary | ICD-10-CM | POA: Diagnosis not present

## 2016-12-05 DIAGNOSIS — Z299 Encounter for prophylactic measures, unspecified: Secondary | ICD-10-CM | POA: Diagnosis not present

## 2016-12-09 DIAGNOSIS — Z6823 Body mass index (BMI) 23.0-23.9, adult: Secondary | ICD-10-CM | POA: Diagnosis not present

## 2016-12-09 DIAGNOSIS — M25561 Pain in right knee: Secondary | ICD-10-CM | POA: Diagnosis not present

## 2016-12-09 DIAGNOSIS — Z1289 Encounter for screening for malignant neoplasm of other sites: Secondary | ICD-10-CM | POA: Diagnosis not present

## 2016-12-09 DIAGNOSIS — Z1231 Encounter for screening mammogram for malignant neoplasm of breast: Secondary | ICD-10-CM | POA: Diagnosis not present

## 2016-12-09 DIAGNOSIS — Z96651 Presence of right artificial knee joint: Secondary | ICD-10-CM | POA: Diagnosis not present

## 2016-12-12 ENCOUNTER — Encounter (INDEPENDENT_AMBULATORY_CARE_PROVIDER_SITE_OTHER): Payer: Self-pay | Admitting: Orthopedic Surgery

## 2016-12-12 ENCOUNTER — Ambulatory Visit (INDEPENDENT_AMBULATORY_CARE_PROVIDER_SITE_OTHER): Payer: Medicare Other | Admitting: Orthopedic Surgery

## 2016-12-12 VITALS — Ht 67.0 in | Wt 157.0 lb

## 2016-12-12 DIAGNOSIS — M25561 Pain in right knee: Secondary | ICD-10-CM | POA: Diagnosis not present

## 2016-12-12 DIAGNOSIS — Z96651 Presence of right artificial knee joint: Secondary | ICD-10-CM | POA: Diagnosis not present

## 2016-12-12 NOTE — Progress Notes (Signed)
   Office Visit Note   Patient: Lydia Graham           Date of Birth: May 07, 1936           MRN: [id number] Visit Date: 12/12/2016              Requested by: Glenda Chroman, MD [address] [place], [place] [address] PCP: Glenda Chroman, MD  Chief Complaint  Patient presents with  . Right Knee - Routine Post Op    10/23/16 right total knee       HPI: Patient is 6 weeks status post right total knee arthroplasty. She has a weak left of outpatient physical therapy she is quite pleased with her progress she ambulates with a cane.  Assessment & Plan: Visit Diagnoses:  1. Total knee replacement status, right     Plan: Continue physical therapy recommended getting a gym membership so she can work on the strengthening.  Follow-Up Instructions: Return if symptoms worsen or fail to improve.   Ortho Exam  Patient is alert, oriented, no adenopathy, well-dressed, normal affect, normal respiratory effort. Examination she has excellent range of motion she has a little bit of swelling in the knee there is no redness no cellulitis no signs of infection. She has range of motion from 0-120.  Imaging: No results found.  Labs: No results found for: HGBA1C, ESRSEDRATE, CRP, LABURIC, REPTSTATUS, GRAMSTAIN, CULT, LABORGA  Orders:  No orders of the defined types were placed in this encounter.  No orders of the defined types were placed in this encounter.    Procedures: No procedures performed  Clinical Data: No additional findings.  ROS:  All other systems negative, except as noted in the HPI. Review of Systems  Objective: Vital Signs: Ht 5\' 7"  (1.702 m)   Wt 157 lb (71.2 kg)   LMP  (LMP Unknown)   BMI 24.59 kg/m   Specialty Comments:  No specialty comments available.  PMFS History: Patient Active Problem List   Diagnosis Date Noted  . Total knee replacement status, right 10/23/2016  . Unilateral primary osteoarthritis, right knee 07/13/2016   Past Medical History:    Diagnosis Date  . Arthritis    oa  . GERD (gastroesophageal reflux disease)   . Headache   . Hypertension     No family history on file.  Past Surgical History:  Procedure Laterality Date  . ABDOMINAL HYSTERECTOMY    . APPENDECTOMY    . BACK SURGERY    . TONSILLECTOMY    . TOTAL KNEE ARTHROPLASTY Right 10/23/2016   Procedure: RIGHT TOTAL KNEE ARTHROPLASTY;  Surgeon: [name], MD;  Location: [facility];  Service: Orthopedics;  Laterality: Right;   Social History   Occupational History  . Not on file.   Social History Main Topics  . Smoking status: Never Smoker  . Smokeless tobacco: Never Used  . Alcohol use No  . Drug use: No  . Sexual activity: Not on file

## 2016-12-17 DIAGNOSIS — Z96651 Presence of right artificial knee joint: Secondary | ICD-10-CM | POA: Diagnosis not present

## 2016-12-17 DIAGNOSIS — M25561 Pain in right knee: Secondary | ICD-10-CM | POA: Diagnosis not present

## 2016-12-19 DIAGNOSIS — Z96651 Presence of right artificial knee joint: Secondary | ICD-10-CM | POA: Diagnosis not present

## 2016-12-19 DIAGNOSIS — M25561 Pain in right knee: Secondary | ICD-10-CM | POA: Diagnosis not present

## 2017-01-13 ENCOUNTER — Encounter (INDEPENDENT_AMBULATORY_CARE_PROVIDER_SITE_OTHER): Payer: Self-pay | Admitting: Family

## 2017-01-13 ENCOUNTER — Ambulatory Visit (INDEPENDENT_AMBULATORY_CARE_PROVIDER_SITE_OTHER): Payer: Medicare Other

## 2017-01-13 ENCOUNTER — Ambulatory Visit (INDEPENDENT_AMBULATORY_CARE_PROVIDER_SITE_OTHER): Payer: Medicare Other | Admitting: Orthopedic Surgery

## 2017-01-13 DIAGNOSIS — Z96651 Presence of right artificial knee joint: Secondary | ICD-10-CM | POA: Diagnosis not present

## 2017-01-13 DIAGNOSIS — G8929 Other chronic pain: Secondary | ICD-10-CM

## 2017-01-13 DIAGNOSIS — M25561 Pain in right knee: Secondary | ICD-10-CM | POA: Diagnosis not present

## 2017-01-13 NOTE — Progress Notes (Signed)
   Office Visit Note   Patient: Lydia Graham           Date of Birth: 1936-03-02           MRN: [id number] Visit Date: 01/13/2017              Requested by: Glenda Chroman, MD [address] [place],  [address] PCP: Glenda Chroman, MD  Chief Complaint  Patient presents with  . Right Knee - Follow-up      HPI: Patient is a 81 year old woman who states that she is walking at least 5 miles a week she is wearing knee-high compression stockings states that she's developed a blister at the top of the stocking and is concerned that she may have some hardware problems with her total knee arthroplasty.  Assessment & Plan: Visit Diagnoses:  1. Chronic pain of right knee   2. S/P total knee arthroplasty, right     Plan: Recommended strength training at the [facility] and use a trainer for guidance. Continue with her walking restrictions.  Follow-Up Instructions: Return if symptoms worsen or fail to improve.   Ortho Exam  Patient is alert, oriented, no adenopathy, well-dressed, normal affect, normal respiratory effort. Examination patient uses a cane for ambulation. Her knee has full extension there is no effusion she does have some healing ulcers from her compression stockings. The incision is well-healed no signs of infection  Imaging: Xr Knee 1-2 Views Right  Result Date: 01/13/2017 2 view radiographs of the right knee shows a stable total knee arthroplasty no complicating features no lucency.   Labs: No results found for: HGBA1C, ESRSEDRATE, CRP, LABURIC, REPTSTATUS, GRAMSTAIN, CULT, LABORGA  Orders:  Orders Placed This Encounter  Procedures  . XR Knee 1-2 Views Right   No orders of the defined types were placed in this encounter.    Procedures: No procedures performed  Clinical Data: No additional findings.  ROS:  All other systems negative, except as noted in the HPI. Review of Systems  Objective: Vital Signs: LMP  (LMP Unknown)   Specialty Comments:  No  specialty comments available.  PMFS History: Patient Active Problem List   Diagnosis Date Noted  . S/P total knee arthroplasty, right 10/23/2016  . Unilateral primary osteoarthritis, right knee 07/13/2016   Past Medical History:  Diagnosis Date  . Arthritis    oa  . GERD (gastroesophageal reflux disease)   . Headache   . Hypertension     History reviewed. No pertinent family history.  Past Surgical History:  Procedure Laterality Date  . ABDOMINAL HYSTERECTOMY    . APPENDECTOMY    . BACK SURGERY    . TONSILLECTOMY    . TOTAL KNEE ARTHROPLASTY Right 10/23/2016   Procedure: RIGHT TOTAL KNEE ARTHROPLASTY;  Surgeon: [name], MD;  Location: [facility];  Service: Orthopedics;  Laterality: Right;   Social History   Occupational History  . Not on file.   Social History Main Topics  . Smoking status: Never Smoker  . Smokeless tobacco: Never Used  . Alcohol use No  . Drug use: No  . Sexual activity: Not on file

## 2017-02-14 DIAGNOSIS — I8390 Asymptomatic varicose veins of unspecified lower extremity: Secondary | ICD-10-CM | POA: Diagnosis not present

## 2017-02-14 DIAGNOSIS — Z299 Encounter for prophylactic measures, unspecified: Secondary | ICD-10-CM | POA: Diagnosis not present

## 2017-02-14 DIAGNOSIS — Z713 Dietary counseling and surveillance: Secondary | ICD-10-CM | POA: Diagnosis not present

## 2017-02-14 DIAGNOSIS — J45909 Unspecified asthma, uncomplicated: Secondary | ICD-10-CM | POA: Diagnosis not present

## 2017-02-14 DIAGNOSIS — B079 Viral wart, unspecified: Secondary | ICD-10-CM | POA: Diagnosis not present

## 2017-02-14 DIAGNOSIS — Z6825 Body mass index (BMI) 25.0-25.9, adult: Secondary | ICD-10-CM | POA: Diagnosis not present

## 2017-02-14 DIAGNOSIS — E7800 Pure hypercholesterolemia, unspecified: Secondary | ICD-10-CM | POA: Diagnosis not present

## 2017-02-14 DIAGNOSIS — I10 Essential (primary) hypertension: Secondary | ICD-10-CM | POA: Diagnosis not present

## 2017-02-14 DIAGNOSIS — H6691 Otitis media, unspecified, right ear: Secondary | ICD-10-CM | POA: Diagnosis not present

## 2017-03-05 ENCOUNTER — Telehealth: Payer: Self-pay

## 2017-03-05 DIAGNOSIS — R0989 Other specified symptoms and signs involving the circulatory and respiratory systems: Secondary | ICD-10-CM

## 2017-03-05 DIAGNOSIS — H6521 Chronic serous otitis media, right ear: Secondary | ICD-10-CM | POA: Diagnosis not present

## 2017-03-05 DIAGNOSIS — H7191 Unspecified cholesteatoma, right ear: Secondary | ICD-10-CM | POA: Diagnosis not present

## 2017-03-05 DIAGNOSIS — H9311 Tinnitus, right ear: Secondary | ICD-10-CM | POA: Diagnosis not present

## 2017-03-05 DIAGNOSIS — H6691 Otitis media, unspecified, right ear: Secondary | ICD-10-CM | POA: Diagnosis not present

## 2017-03-05 NOTE — Telephone Encounter (Signed)
DOD Call-   Per, Dr. Irish Lack (DOD), patient will need a Carotid Doppler ordered for bruit and will need to follow up with Dr. Bronson Ing in [place]. Carotid Doppler ordered for next available and message sent to [facility].

## 2017-03-12 DIAGNOSIS — H6691 Otitis media, unspecified, right ear: Secondary | ICD-10-CM | POA: Diagnosis not present

## 2017-03-12 DIAGNOSIS — H6521 Chronic serous otitis media, right ear: Secondary | ICD-10-CM | POA: Diagnosis not present

## 2017-03-13 ENCOUNTER — Ambulatory Visit: Payer: Medicare Other

## 2017-03-13 DIAGNOSIS — I6523 Occlusion and stenosis of bilateral carotid arteries: Secondary | ICD-10-CM | POA: Diagnosis not present

## 2017-03-13 DIAGNOSIS — R0989 Other specified symptoms and signs involving the circulatory and respiratory systems: Secondary | ICD-10-CM

## 2017-03-14 ENCOUNTER — Encounter: Payer: Self-pay | Admitting: Cardiovascular Disease

## 2017-03-14 ENCOUNTER — Ambulatory Visit (INDEPENDENT_AMBULATORY_CARE_PROVIDER_SITE_OTHER): Payer: Medicare Other | Admitting: Cardiovascular Disease

## 2017-03-14 VITALS — BP 160/84 | HR 66 | Ht 67.0 in | Wt 154.0 lb

## 2017-03-14 DIAGNOSIS — R0989 Other specified symptoms and signs involving the circulatory and respiratory systems: Secondary | ICD-10-CM

## 2017-03-14 DIAGNOSIS — I10 Essential (primary) hypertension: Secondary | ICD-10-CM | POA: Diagnosis not present

## 2017-03-14 DIAGNOSIS — I6522 Occlusion and stenosis of left carotid artery: Secondary | ICD-10-CM | POA: Diagnosis not present

## 2017-03-14 DIAGNOSIS — Z01818 Encounter for other preprocedural examination: Secondary | ICD-10-CM

## 2017-03-14 NOTE — Progress Notes (Signed)
CARDIOLOGY CONSULT NOTE  Patient ID: Lydia Graham MRN: [id number] DOB/AGE: 09-06-1935 81 y.o.  Admit date: (Not on file) Primary Physician: Glenda Chroman, MD Referring Physician: Woody Seller  Reason for Consultation: carotid bruit  HPI: Lydia Graham is a 81 y.o. female who is being seen today for the evaluation of carotid bruit at the request of Vyas, Dhruv B, MD.   ECG performed on 10/14/16 which I personally interpreted demonstrated sinus rhythm with LVH and nonspecific intraventricular conduction delay.  She has a history of hypertension.  She was seen by her ENT physician yesterday who appreciated a left carotid bruit and wanted her evaluated today.  She underwent carotid artery Dopplers yesterday but the official report is not on the system yet.  The patient denies any symptoms of chest pain, palpitations, shortness of breath, lightheadedness, dizziness, leg swelling, orthopnea, PND, and syncope.  She walks 2 miles daily on most days without limitations. She can climb a flight of stairs without problems as well.  She has had arthritis since the age of 37 affecting her back and hands. She denies a history of rheumatoid arthritis. She takes Aleve daily for this.  Her mother underwent CABG in her 70s.   Allergies  Allergen Reactions  . Rosuvastatin Other (See Comments)    MYALGIA's Muscle pain    Current Outpatient Prescriptions  Medication Sig Dispense Refill  . amLODipine (NORVASC) 10 MG tablet Take 10 mg by mouth daily at 12 noon.     Marland Kitchen aspirin EC 81 MG tablet Take 81 mg by mouth daily.    . butalbital-acetaminophen-caffeine (FIORICET, ESGIC) 50-325-40 MG tablet Take 1 tablet by mouth every 6 (six) hours as needed for headache or migraine.     . calcium-vitamin D (OSCAL WITH D) 500-200 MG-UNIT tablet Take 1 tablet by mouth daily.    . carvedilol (COREG) 25 MG tablet Take 25 mg by mouth 2 (two) times daily with a meal.     . ciprofloxacin (CIPRO) 500 MG  tablet     . Cyanocobalamin (VITAMIN B 12 PO) Take 1 tablet by mouth daily.     Marland Kitchen estradiol (ESTRACE) 0.5 MG tablet Take 0.5 mg by mouth daily.     . fluticasone (FLONASE) 50 MCG/ACT nasal spray Place 1 spray into both nostrils daily.    Marland Kitchen losartan (COZAAR) 100 MG tablet Take 100 mg by mouth daily at 12 noon.     . magnesium hydroxide (MILK OF MAGNESIA) 400 MG/5ML suspension Take 30 mLs by mouth daily as needed for mild constipation.    . Multiple Vitamins-Minerals (MULTIVITAMIN WITH MINERALS) tablet Take 1 tablet by mouth daily.    . naproxen sodium (ANAPROX) 220 MG tablet Take 220 mg by mouth daily.     . Omega-3 Fatty Acids (FISH OIL) 1000 MG CAPS Take 1,000 mg by mouth daily.     . pantoprazole (PROTONIX) 40 MG tablet Take 40 mg by mouth daily before breakfast.      No current facility-administered medications for this visit.     Past Medical History:  Diagnosis Date  . Arthritis    oa  . GERD (gastroesophageal reflux disease)   . Headache   . Hypertension     Past Surgical History:  Procedure Laterality Date  . ABDOMINAL HYSTERECTOMY    . APPENDECTOMY    . BACK SURGERY    . TONSILLECTOMY    . TOTAL KNEE ARTHROPLASTY Right 10/23/2016   Procedure: RIGHT TOTAL KNEE  ARTHROPLASTY;  Surgeon: [name], MD;  Location: [facility];  Service: Orthopedics;  Laterality: Right;    Social History   Social History  . Marital status: Single    Spouse name: N/A  . Number of children: N/A  . Years of education: N/A   Occupational History  . Not on file.   Social History Main Topics  . Smoking status: Never Smoker  . Smokeless tobacco: Never Used  . Alcohol use No  . Drug use: No  . Sexual activity: Not on file   Other Topics Concern  . Not on file   Social History Narrative  . No narrative on file     No family history of premature CAD in 1st degree relatives.  Current Meds  Medication Sig  . amLODipine (NORVASC) 10 MG tablet Take 10 mg by mouth daily at 12 noon.   Marland Kitchen  aspirin EC 81 MG tablet Take 81 mg by mouth daily.  . butalbital-acetaminophen-caffeine (FIORICET, ESGIC) 50-325-40 MG tablet Take 1 tablet by mouth every 6 (six) hours as needed for headache or migraine.   . calcium-vitamin D (OSCAL WITH D) 500-200 MG-UNIT tablet Take 1 tablet by mouth daily.  . carvedilol (COREG) 25 MG tablet Take 25 mg by mouth 2 (two) times daily with a meal.   . ciprofloxacin (CIPRO) 500 MG tablet   . Cyanocobalamin (VITAMIN B 12 PO) Take 1 tablet by mouth daily.   Marland Kitchen estradiol (ESTRACE) 0.5 MG tablet Take 0.5 mg by mouth daily.   . fluticasone (FLONASE) 50 MCG/ACT nasal spray Place 1 spray into both nostrils daily.  Marland Kitchen losartan (COZAAR) 100 MG tablet Take 100 mg by mouth daily at 12 noon.   . magnesium hydroxide (MILK OF MAGNESIA) 400 MG/5ML suspension Take 30 mLs by mouth daily as needed for mild constipation.  . Multiple Vitamins-Minerals (MULTIVITAMIN WITH MINERALS) tablet Take 1 tablet by mouth daily.  . naproxen sodium (ANAPROX) 220 MG tablet Take 220 mg by mouth daily.   . Omega-3 Fatty Acids (FISH OIL) 1000 MG CAPS Take 1,000 mg by mouth daily.   . pantoprazole (PROTONIX) 40 MG tablet Take 40 mg by mouth daily before breakfast.       Review of systems complete and found to be negative unless listed above in HPI    Physical exam Blood pressure (!) 160/84, pulse 66, height 5\' 7"  (1.702 m), weight 154 lb (69.9 kg), SpO2 98 %. General: NAD Neck: No JVD, no thyromegaly or thyroid nodule.  Lungs: Clear to auscultation bilaterally with normal respiratory effort. CV: Nondisplaced PMI. Regular rate and rhythm, normal S1/S2, no S3/S4, no murmur.  No peripheral edema.  Harsh left carotid bruit.    Abdomen: Soft, nontender, no distention.  Skin: Intact without lesions or rashes.  Neurologic: Alert and oriented x 3.  Psych: Normal affect. Extremities: No clubbing or cyanosis.  HEENT: Normal.   ECG: Most recent ECG reviewed.   Labs: Lab Results  Component Value  Date/Time   K 4.3 10/14/2016 01:40 PM   BUN 14 10/14/2016 01:40 PM   CREATININE 0.98 10/14/2016 01:40 PM   HGB 13.6 10/14/2016 01:40 PM     Lipids: No results found for: LDLCALC, LDLDIRECT, CHOL, TRIG, HDL      ASSESSMENT AND PLAN:  1. Left carotid bruit/carotid artery stenosis: She underwent carotid artery Dopplers yesterday but the official report is not on the system and I am unable to access the images at this time. By physical exam alone the  sound of her bruit is harsh and suggestive of hemodynamically significant stenosis. I will arrange for her to see vascular surgery next week she will likely require carotid endarterectomy. She is able to achieve four METS without difficulty and would not require noninvasive cardiac imaging at this time.  2. Hypertension: Elevated. Needs continued monitoring. No changes to therapy today.     Disposition: Follow up with vascular surgery next week.  Signed: Kate Sable, M.D., F.A.C.C.  03/14/2017, 1:09 PM

## 2017-03-14 NOTE — Patient Instructions (Signed)
Your physician recommends that you schedule a follow-up appointment in: [place]  Your physician recommends that you continue on your current medications as directed. Please refer to the Current Medication list given to you today.  You have been referred to VASCULAR SURGERY   Thank you for choosing [facility]!!

## 2017-03-15 LAB — VAS US CAROTID
LCCADDIAS: 24 cm/s
LCCADSYS: 75 cm/s
LCCAPDIAS: 15 cm/s
LCCAPSYS: 60 cm/s
LEFT ECA DIAS: -79 cm/s
LEFT VERTEBRAL DIAS: 23 cm/s
LICADDIAS: -30 cm/s
LICADSYS: -109 cm/s
LICAPSYS: -547 cm/s
Left ICA prox dias: -158 cm/s
RCCADSYS: -103 cm/s
RIGHT ECA DIAS: -14 cm/s
RIGHT VERTEBRAL DIAS: 16 cm/s
Right CCA prox dias: 17 cm/s
Right CCA prox sys: 112 cm/s

## 2017-03-18 ENCOUNTER — Other Ambulatory Visit: Payer: Self-pay

## 2017-03-18 DIAGNOSIS — I6522 Occlusion and stenosis of left carotid artery: Secondary | ICD-10-CM

## 2017-03-21 ENCOUNTER — Ambulatory Visit (HOSPITAL_COMMUNITY)
Admission: RE | Admit: 2017-03-21 | Discharge: 2017-03-21 | Disposition: A | Discharge status: Home / Self Care | Payer: Medicare Other | Source: Ambulatory Visit | Attending: Vascular Surgery | Admitting: Vascular Surgery

## 2017-03-21 ENCOUNTER — Ambulatory Visit (INDEPENDENT_AMBULATORY_CARE_PROVIDER_SITE_OTHER): Payer: Medicare Other | Admitting: Vascular Surgery

## 2017-03-21 ENCOUNTER — Encounter: Payer: Self-pay | Admitting: Vascular Surgery

## 2017-03-21 ENCOUNTER — Other Ambulatory Visit: Payer: Self-pay

## 2017-03-21 VITALS — BP 157/78 | HR 55 | Temp 97.0°F | Resp 18 | Ht 67.0 in | Wt 152.0 lb

## 2017-03-21 DIAGNOSIS — I6522 Occlusion and stenosis of left carotid artery: Secondary | ICD-10-CM

## 2017-03-21 LAB — VAS US CAROTID
LCCADSYS: 74 cm/s
LCCAPDIAS: 16 cm/s
LEFT ECA DIAS: -57 cm/s
LICAPDIAS: -86 cm/s
LICAPSYS: -317 cm/s
Left CCA dist dias: 26 cm/s
Left CCA prox sys: 56 cm/s
Left ICA dist dias: -19 cm/s
Left ICA dist sys: -55 cm/s

## 2017-03-21 NOTE — Progress Notes (Signed)
Patient ID: Lydia Graham, female   DOB: [birth year], 81 y.o.   MRN: [id number]  Reason for Consult: Carotid (new pt - eval severe L CEA stenosis  - ref by Dr. Bronson Ing)   Referred by Herminio Commons, MD  Subjective:     HPI:  Lydia Graham is a 81 y.o. female with history of hypertension and arthritis and a long history of right ear pain. She was recently seen by her in those and throat doctor and was complaining of hearing her heart beat in her left ear. She was at that time found to have a bruit on the left side and was referred to Dr. Bronson Ing for evaluation of carotid artery disease which time she was found to have high-grade left ICA stenosis. She now presents for further evaluation and possible intervention. She is never had stroke TIA or amaurosis. She remains active walking 2 miles several times per week and can also walk steps. She has no chest pain. She is nondiabetic and has never been a smoker. She does have issues with statin drugs typically causing severe muscle pains that worsen her arthritis. She does take aspirin daily since being worked up for her carotid artery disease.  Past Medical History:  Diagnosis Date  . Arthritis    oa  . GERD (gastroesophageal reflux disease)   . Headache   . Hypertension    Family History  Problem Relation Age of Onset  . Stroke Mother    Past Surgical History:  Procedure Laterality Date  . ABDOMINAL HYSTERECTOMY    . APPENDECTOMY    . BACK SURGERY    . TONSILLECTOMY    . TOTAL KNEE ARTHROPLASTY Right 10/23/2016   Procedure: RIGHT TOTAL KNEE ARTHROPLASTY;  Surgeon: [name], MD;  Location: [facility];  Service: Orthopedics;  Laterality: Right;    Short Social History:  Social History  Substance Use Topics  . Smoking status: Never Smoker  . Smokeless tobacco: Never Used  . Alcohol use No    Allergies  Allergen Reactions  . Rosuvastatin Other (See Comments)    MYALGIA's Muscle pain  . Other     PAIN  MEDICATIONS/ANESTHESIA  MADE BP DROP LOW     Current Outpatient Prescriptions  Medication Sig Dispense Refill  . amLODipine (NORVASC) 10 MG tablet Take 10 mg by mouth daily at 12 noon.     Marland Kitchen aspirin EC 81 MG tablet Take 81 mg by mouth daily.    . butalbital-acetaminophen-caffeine (FIORICET, ESGIC) 50-325-40 MG tablet Take 1 tablet by mouth every 6 (six) hours as needed for headache or migraine.     . calcium-vitamin D (OSCAL WITH D) 500-200 MG-UNIT tablet Take 1 tablet by mouth daily.    . carvedilol (COREG) 25 MG tablet Take 25 mg by mouth 2 (two) times daily with a meal.     . ciprofloxacin (CIPRO) 500 MG tablet     . Cyanocobalamin (VITAMIN B 12 PO) Take 1 tablet by mouth daily.     Marland Kitchen estradiol (ESTRACE) 0.5 MG tablet Take 0.5 mg by mouth daily.     . fluticasone (FLONASE) 50 MCG/ACT nasal spray Place 1 spray into both nostrils daily.    Marland Kitchen losartan (COZAAR) 100 MG tablet Take 100 mg by mouth daily at 12 noon.     . magnesium hydroxide (MILK OF MAGNESIA) 400 MG/5ML suspension Take 30 mLs by mouth daily as needed for mild constipation.    . Multiple Vitamins-Minerals (MULTIVITAMIN WITH MINERALS) tablet  Take 1 tablet by mouth daily.    . naproxen sodium (ANAPROX) 220 MG tablet Take 220 mg by mouth daily.     . Omega-3 Fatty Acids (FISH OIL) 1000 MG CAPS Take 1,000 mg by mouth daily.     . pantoprazole (PROTONIX) 40 MG tablet Take 40 mg by mouth daily before breakfast.      No current facility-administered medications for this visit.     Review of Systems  Constitutional:  Constitutional negative. HENT: HENT negative.  Eyes: Eyes negative.  Respiratory: Respiratory negative.  Cardiovascular: Cardiovascular negative.  GI: Gastrointestinal negative.  Musculoskeletal: Musculoskeletal negative.  Skin: Skin negative.  Neurological: Neurological negative. Hematologic: Hematologic/lymphatic negative.  Psychiatric: Psychiatric negative.        Objective:  Objective   Vitals:    03/21/17 0915 03/21/17 0917  BP: (!) 167/72 (!) 157/78  Pulse: (!) 55   Resp: 18   Temp: (!) 97 F (36.1 C)   TempSrc: Oral   SpO2: 99%   Weight: 152 lb (68.9 kg)   Height: 5\' 7"  (1.702 m)    Body mass index is 23.81 kg/m.  Physical Exam  Constitutional: She is oriented to person, place, and time. She appears well-developed.  HENT:  Head: Normocephalic.  Eyes: Pupils are equal, round, and reactive to light.  Neck: Normal range of motion.  Cardiovascular: Normal rate.   Pulmonary/Chest: Effort normal.  Abdominal: Soft.  Musculoskeletal: Normal range of motion. She exhibits no edema.  Neurological: She is alert and oriented to person, place, and time.  Skin: Skin is warm and dry.  Psychiatric: She has a normal mood and affect. Her behavior is normal. Judgment and thought content normal.    Data: I have independently interpreted her left side a carotid duplex which demonstrates 80-99% stenosis with peak systolic velocity 779 and end-diastolic velocity 390.     Assessment/Plan:     81 year old female here for evaluation of high-grade carotid stenosis on the left. She remains asymptomatic from this. She does take aspirin cannot tolerate statins but does take fish oil daily. We will proceed with left-sided carotid endarterectomy given that she has been cleared by her cardiologist. [name] get her scheduled today. We discussed the risk benefits and alternatives to proceeding and she agrees to surgery.      [name] MD [facility]

## 2017-03-24 ENCOUNTER — Other Ambulatory Visit (HOSPITAL_COMMUNITY): Payer: Self-pay

## 2017-03-25 ENCOUNTER — Encounter (HOSPITAL_COMMUNITY)
Admission: RE | Admit: 2017-03-25 | Discharge: 2017-03-25 | Disposition: A | Discharge status: Home / Self Care | Payer: Medicare Other | Source: Ambulatory Visit | Attending: Vascular Surgery | Admitting: Vascular Surgery

## 2017-03-25 ENCOUNTER — Encounter (HOSPITAL_COMMUNITY): Payer: Self-pay

## 2017-03-25 LAB — COMPREHENSIVE METABOLIC PANEL
ALBUMIN: 4 g/dL (ref 3.5–5.0)
ALT: 13 U/L — AB (ref 14–54)
AST: 25 U/L (ref 15–41)
Alkaline Phosphatase: 83 U/L (ref 38–126)
Anion gap: 8 (ref 5–15)
BILIRUBIN TOTAL: 0.5 mg/dL (ref 0.3–1.2)
BUN: 17 mg/dL (ref 6–20)
CALCIUM: 9.4 mg/dL (ref 8.9–10.3)
CO2: 25 mmol/L (ref 22–32)
CREATININE: 1.09 mg/dL — AB (ref 0.44–1.00)
Chloride: 100 mmol/L — ABNORMAL LOW (ref 101–111)
GFR calc Af Amer: 54 mL/min — ABNORMAL LOW (ref >60)
GFR calc non Af Amer: 47 mL/min — ABNORMAL LOW (ref >60)
Glucose, Bld: 97 mg/dL (ref 65–99)
Potassium: 4.2 mmol/L (ref 3.5–5.1)
SODIUM: 133 mmol/L — AB (ref 135–145)
TOTAL PROTEIN: 6.3 g/dL — AB (ref 6.5–8.1)

## 2017-03-25 LAB — URINALYSIS, ROUTINE W REFLEX MICROSCOPIC
Bilirubin Urine: NEGATIVE
Glucose, UA: NEGATIVE mg/dL
HGB URINE DIPSTICK: NEGATIVE
Ketones, ur: NEGATIVE mg/dL
LEUKOCYTES UA: NEGATIVE
NITRITE: NEGATIVE
PROTEIN: NEGATIVE mg/dL
SPECIFIC GRAVITY, URINE: 1.006 (ref 1.005–1.030)
pH: 7 (ref 5.0–8.0)

## 2017-03-25 LAB — CBC
HCT: 40.3 % (ref 36.0–46.0)
Hemoglobin: 13.7 g/dL (ref 12.0–15.0)
MCH: 29.5 pg (ref 26.0–34.0)
MCHC: 34 g/dL (ref 30.0–36.0)
MCV: 86.9 fL (ref 78.0–100.0)
Platelets: 210 10*3/uL (ref 150–400)
RBC: 4.64 MIL/uL (ref 3.87–5.11)
RDW: 13.4 % (ref 11.5–15.5)
WBC: 6.8 10*3/uL (ref 4.0–10.5)

## 2017-03-25 LAB — TYPE AND SCREEN
ABO/RH(D): A POS
Antibody Screen: NEGATIVE

## 2017-03-25 LAB — PROTIME-INR
INR: 1.09
Prothrombin Time: 14 seconds (ref 11.4–15.2)

## 2017-03-25 LAB — SURGICAL PCR SCREEN
MRSA, PCR: NEGATIVE
STAPHYLOCOCCUS AUREUS: NEGATIVE

## 2017-03-25 LAB — ABO/RH: ABO/RH(D): A POS

## 2017-03-25 LAB — APTT: aPTT: 29 seconds (ref 24–36)

## 2017-03-25 NOTE — Pre-Procedure Instructions (Signed)
[place]  03/25/2017      [facility] - [place], [place], [place] [address] [place] [address] Phone: [phone] Fax: [phone]  [place], [place] [address] [place] [address] Phone: [phone] Fax: [phone]    Your procedure is scheduled on 03/27/2017  Report to [facility] Admitting at 5:30 A.M.  Call this number if you have problems the morning of surgery:  [phone]   Remember:  CONTINUE regular medicine through tomorrow, EXCEPT: Aleve & FISHOIL   Do not eat food or drink liquids after midnight.  On Wednesday   Take these medicines the morning of surgery with A SIP OF WATER : Amlodipine, Carvedilol, Estradiol, Protonix   Do not wear jewelry, make-up or nail polish.  Do not wear lotions, powders, or perfumes, or deoderant.  Do not shave 48 hours prior to surgery.    Do not bring valuables to the hospital.  [facility] is not responsible for any belongings or valuables.  Contacts, dentures or bridgework may not be worn into surgery.  Leave your suitcase in the car.  After surgery it may be brought to your room.  For patients admitted to the hospital, discharge time will be determined by your treatment team.  Patients discharged the day of surgery will not be allowed to drive home.   Name and phone number of your driver:  With sister  Special instructions:  Special Instructions: [facility] - Preparing for Surgery  Before surgery, you can play an important role.  Because skin is not sterile, your skin needs to be as free of germs as possible.  You can reduce the number of germs on you skin by washing with CHG (chlorahexidine gluconate) soap before surgery.  CHG is an antiseptic cleaner which kills germs and bonds with the skin to continue killing germs even after washing.  Please DO NOT use if you have an allergy to CHG or antibacterial soaps.  If your skin  becomes reddened/irritated stop using the CHG and inform your nurse when you arrive at Short Stay.  Do not shave (including legs and underarms) for at least 48 hours prior to the first CHG shower.  You may shave your face.  Please follow these instructions carefully:   1.  Shower with CHG Soap the night before surgery and the  morning of Surgery.  2.  If you choose to wash your hair, wash your hair first as usual with your  normal shampoo.  3.  After you shampoo, rinse your hair and body thoroughly to remove the  Shampoo.  4.  Use CHG as you would any other liquid soap.  You can apply chg directly to the skin and wash gently with scrungie or a clean washcloth.  5.  Apply the CHG Soap to your body ONLY FROM THE NECK DOWN.    Do not use on open wounds or open sores.  Avoid contact with your eyes, ears, mouth and genitals (private parts).  Wash genitals (private parts)   with your normal soap.  6.  Wash thoroughly, paying special attention to the area where your surgery will be performed.  7.  Thoroughly rinse your body with warm water from the neck down.  8.  DO NOT shower/wash with your normal soap after using and rinsing off   the CHG Soap.  9.  Pat yourself  dry with a clean towel.            10.  Wear clean pajamas.            11.  Place clean sheets on your bed the night of your first shower and do not sleep with pets.  Day of Surgery  Do not apply any lotions/deodorants the morning of surgery.  Please wear clean clothes to the hospital/surgery center.  Please read over the following fact sheets that you were given. Pain Booklet, Coughing and Deep Breathing, MRSA Information and Surgical Site Infection Prevention

## 2017-03-25 NOTE — Progress Notes (Signed)
Pt. Followed by Dr. Woody Seller for PCP, also followed by Dr. Ernesto Rutherford, for ear problems, in fact recently treated with 10 day course of antibiotic for ear infection. Referred to Dr. Jacinta Shoe & then Dr. Donzetta Matters for treatment of carotid artery.  Pt. Reports that she was instructed by Dr. Donzetta Matters to continue asa. Pt. Takes Aleve- 2.5 tabs q morning for OA & she was asked to hold that for tomorrow & day of surgery. Pt. Had Echo at [facility] about 4 yrs. Ago, unsure why it was done.

## 2017-03-25 NOTE — Progress Notes (Signed)
Call to Dr. Woody Seller office, Spoke with Sharyn Lull & she reports there was n't ever any cardiac activity; testing, or referral in their office on this pt. Until 02/2017. No stress test or echo or cath. Visible in her record.

## 2017-03-26 NOTE — Anesthesia Preprocedure Evaluation (Addendum)
Anesthesia Evaluation  Patient identified by MRN, date of birth, ID band Patient awake    Reviewed: Allergy & Precautions, NPO status , Patient's Chart, lab work & pertinent test results, reviewed documented beta blocker date and time   History of Anesthesia Complications Negative for: history of anesthetic complications  Airway Mallampati: III  TM Distance: >3 FB Neck ROM: Full    Dental  (+) Dental Advisory Given, Teeth Intact   Pulmonary neg pulmonary ROS,    breath sounds clear to auscultation       Cardiovascular Exercise Tolerance: Good hypertension, Pt. on home beta blockers and Pt. on medications + Peripheral Vascular Disease   Rhythm:Regular Rate:Normal  80-99% left ICA occlusion   Neuro/Psych  Headaches,    GI/Hepatic Neg liver ROS, GERD  Medicated and Controlled,  Endo/Other  negative endocrine ROS  Renal/GU negative Renal ROS     Musculoskeletal  (+) Arthritis , Osteoarthritis,    Abdominal   Peds  Hematology negative hematology ROS (+)   Anesthesia Other Findings   Reproductive/Obstetrics                            Anesthesia Physical  Anesthesia Plan  ASA: III  Anesthesia Plan: General   Post-op Pain Management:    Induction:   PONV Risk Score and Plan: 4 or greater and Ondansetron, Treatment may vary due to age or medical condition and Dexamethasone  Airway Management Planned: Oral ETT  Additional Equipment: Arterial line  Intra-op Plan:   Post-operative Plan: Extubation in OR  Informed Consent: I have reviewed the patients History and Physical, chart, labs and discussed the procedure including the risks, benefits and alternatives for the proposed anesthesia with the patient or authorized representative who has indicated his/her understanding and acceptance.   Dental advisory given  Plan Discussed with: CRNA  Anesthesia Plan Comments:          Anesthesia Quick Evaluation

## 2017-03-27 ENCOUNTER — Telehealth: Payer: Self-pay | Admitting: Vascular Surgery

## 2017-03-27 ENCOUNTER — Encounter (HOSPITAL_COMMUNITY)
Admission: RE | Disposition: A | Discharge status: Home / Self Care | Payer: Self-pay | Source: Ambulatory Visit | Attending: Vascular Surgery

## 2017-03-27 ENCOUNTER — Inpatient Hospital Stay (HOSPITAL_COMMUNITY): Payer: Medicare Other

## 2017-03-27 ENCOUNTER — Encounter (HOSPITAL_COMMUNITY): Payer: Self-pay | Admitting: Urology

## 2017-03-27 ENCOUNTER — Inpatient Hospital Stay (HOSPITAL_COMMUNITY)
Admission: RE | Admit: 2017-03-27 | Discharge: 2017-03-28 | DRG: 039 | Disposition: A | Discharge status: Home / Self Care | Payer: Medicare Other | Source: Ambulatory Visit | Attending: Vascular Surgery | Admitting: Vascular Surgery

## 2017-03-27 DIAGNOSIS — Z7982 Long term (current) use of aspirin: Secondary | ICD-10-CM | POA: Diagnosis not present

## 2017-03-27 DIAGNOSIS — K219 Gastro-esophageal reflux disease without esophagitis: Secondary | ICD-10-CM | POA: Diagnosis present

## 2017-03-27 DIAGNOSIS — I10 Essential (primary) hypertension: Secondary | ICD-10-CM | POA: Diagnosis not present

## 2017-03-27 DIAGNOSIS — M1990 Unspecified osteoarthritis, unspecified site: Secondary | ICD-10-CM | POA: Diagnosis present

## 2017-03-27 DIAGNOSIS — I6522 Occlusion and stenosis of left carotid artery: Principal | ICD-10-CM | POA: Diagnosis present

## 2017-03-27 DIAGNOSIS — I739 Peripheral vascular disease, unspecified: Secondary | ICD-10-CM | POA: Diagnosis present

## 2017-03-27 DIAGNOSIS — Z96651 Presence of right artificial knee joint: Secondary | ICD-10-CM | POA: Diagnosis present

## 2017-03-27 DIAGNOSIS — I6529 Occlusion and stenosis of unspecified carotid artery: Secondary | ICD-10-CM | POA: Diagnosis present

## 2017-03-27 HISTORY — PX: ENDARTERECTOMY: SHX5162

## 2017-03-27 HISTORY — PX: PATCH ANGIOPLASTY: SHX6230

## 2017-03-27 LAB — CREATININE, SERUM
Creatinine, Ser: 1.09 mg/dL — ABNORMAL HIGH (ref 0.44–1.00)
GFR calc Af Amer: 54 mL/min — ABNORMAL LOW (ref >60)
GFR calc non Af Amer: 47 mL/min — ABNORMAL LOW (ref >60)

## 2017-03-27 LAB — CBC
HEMATOCRIT: 35.5 % — AB (ref 36.0–46.0)
Hemoglobin: 12.2 g/dL (ref 12.0–15.0)
MCH: 29.8 pg (ref 26.0–34.0)
MCHC: 34.4 g/dL (ref 30.0–36.0)
MCV: 86.8 fL (ref 78.0–100.0)
PLATELETS: 199 10*3/uL (ref 150–400)
RBC: 4.09 MIL/uL (ref 3.87–5.11)
RDW: 13.6 % (ref 11.5–15.5)
WBC: 6.5 10*3/uL (ref 4.0–10.5)

## 2017-03-27 LAB — POCT ACTIVATED CLOTTING TIME: Activated Clotting Time: 246 seconds

## 2017-03-27 SURGERY — ENDARTERECTOMY, CAROTID
Anesthesia: General | Site: Neck | Laterality: Left

## 2017-03-27 MED ORDER — CARVEDILOL 25 MG PO TABS
25.0000 mg | ORAL_TABLET | Freq: Two times a day (BID) | ORAL | Status: DC
  Administered 2017-03-27: 25 mg via ORAL
  Filled 2017-03-27: qty 1

## 2017-03-27 MED ORDER — PANTOPRAZOLE SODIUM 40 MG PO TBEC
40.0000 mg | DELAYED_RELEASE_TABLET | Freq: Every day | ORAL | Status: DC
Start: 2017-03-28 — End: 2017-03-28

## 2017-03-27 MED ORDER — CEFUROXIME SODIUM 1.5 G IV SOLR
1.5000 g | Freq: Two times a day (BID) | INTRAVENOUS | Status: DC
  Administered 2017-03-27: 1.5 g via INTRAVENOUS
  Filled 2017-03-27 (×2): qty 1.5

## 2017-03-27 MED ORDER — MAGNESIUM HYDROXIDE 400 MG/5ML PO SUSP: 30.0000 mL | Freq: Every day | ORAL | Status: DC | PRN

## 2017-03-27 MED ORDER — LOSARTAN POTASSIUM 50 MG PO TABS: 100.0000 mg | ORAL_TABLET | Freq: Every day | ORAL | Status: DC

## 2017-03-27 MED ORDER — ESMOLOL HCL 100 MG/10ML IV SOLN
INTRAVENOUS | Status: DC | PRN
  Administered 2017-03-27: 20 mg via INTRAVENOUS

## 2017-03-27 MED ORDER — ASPIRIN EC 81 MG PO TBEC: 81.0000 mg | DELAYED_RELEASE_TABLET | Freq: Every day | ORAL | Status: DC

## 2017-03-27 MED ORDER — FLUTICASONE PROPIONATE 50 MCG/ACT NA SUSP
1.0000 | Freq: Every day | NASAL | Status: DC
  Filled 2017-03-27: qty 16

## 2017-03-27 MED ORDER — METOPROLOL TARTRATE 5 MG/5ML IV SOLN: 2.0000 mg | INTRAVENOUS | Status: DC | PRN

## 2017-03-27 MED ORDER — NEOSTIGMINE METHYLSULFATE 5 MG/5ML IV SOSY
PREFILLED_SYRINGE | INTRAVENOUS | Status: AC
  Filled 2017-03-27: qty 15

## 2017-03-27 MED ORDER — PROTAMINE SULFATE 10 MG/ML IV SOLN
INTRAVENOUS | Status: DC | PRN
  Administered 2017-03-27 (×2): 20 mg via INTRAVENOUS
  Administered 2017-03-27: 10 mg via INTRAVENOUS

## 2017-03-27 MED ORDER — LIDOCAINE HCL (PF) 1 % IJ SOLN
INTRAMUSCULAR | Status: AC
  Filled 2017-03-27: qty 5

## 2017-03-27 MED ORDER — LIDOCAINE 2% (20 MG/ML) 5 ML SYRINGE
INTRAMUSCULAR | Status: DC | PRN
  Administered 2017-03-27: 60 mg via INTRAVENOUS

## 2017-03-27 MED ORDER — CEFUROXIME SODIUM 1.5 G IV SOLR
1.5000 g | INTRAVENOUS | Status: AC
  Administered 2017-03-27: 1.5 g via INTRAVENOUS
  Filled 2017-03-27: qty 1.5

## 2017-03-27 MED ORDER — OXYCODONE-ACETAMINOPHEN 5-325 MG PO TABS
1.0000 | ORAL_TABLET | ORAL | Status: DC | PRN
  Administered 2017-03-28: 1 via ORAL
  Filled 2017-03-27: qty 1

## 2017-03-27 MED ORDER — DEXAMETHASONE SODIUM PHOSPHATE 10 MG/ML IJ SOLN
INTRAMUSCULAR | Status: AC
  Filled 2017-03-27: qty 1

## 2017-03-27 MED ORDER — ONDANSETRON HCL 4 MG/2ML IJ SOLN
INTRAMUSCULAR | Status: AC
  Filled 2017-03-27: qty 2

## 2017-03-27 MED ORDER — ESMOLOL HCL 100 MG/10ML IV SOLN
INTRAVENOUS | Status: AC
  Filled 2017-03-27: qty 20

## 2017-03-27 MED ORDER — PANTOPRAZOLE SODIUM 40 MG PO TBEC: 40.0000 mg | DELAYED_RELEASE_TABLET | Freq: Every day | ORAL | Status: DC

## 2017-03-27 MED ORDER — SUGAMMADEX SODIUM 200 MG/2ML IV SOLN
INTRAVENOUS | Status: DC | PRN
  Administered 2017-03-27: 200 mg via INTRAVENOUS

## 2017-03-27 MED ORDER — PROTAMINE SULFATE 10 MG/ML IV SOLN
INTRAVENOUS | Status: AC
  Filled 2017-03-27: qty 5

## 2017-03-27 MED ORDER — EPHEDRINE 5 MG/ML INJ
INTRAVENOUS | Status: AC
  Filled 2017-03-27: qty 10

## 2017-03-27 MED ORDER — MIDAZOLAM HCL 2 MG/2ML IJ SOLN
INTRAMUSCULAR | Status: AC
  Filled 2017-03-27: qty 2

## 2017-03-27 MED ORDER — EPHEDRINE SULFATE-NACL 50-0.9 MG/10ML-% IV SOSY
PREFILLED_SYRINGE | INTRAVENOUS | Status: DC | PRN
  Administered 2017-03-27 (×3): 5 mg via INTRAVENOUS

## 2017-03-27 MED ORDER — CHLORHEXIDINE GLUCONATE 4 % EX LIQD: 60.0000 mL | Freq: Once | CUTANEOUS | Status: DC

## 2017-03-27 MED ORDER — DEXAMETHASONE SODIUM PHOSPHATE 10 MG/ML IJ SOLN
INTRAMUSCULAR | Status: DC | PRN
  Administered 2017-03-27: 10 mg via INTRAVENOUS

## 2017-03-27 MED ORDER — DEXAMETHASONE SODIUM PHOSPHATE 10 MG/ML IJ SOLN
INTRAMUSCULAR | Status: AC
  Filled 2017-03-27: qty 5

## 2017-03-27 MED ORDER — METOPROLOL TARTRATE 5 MG/5ML IV SOLN
INTRAVENOUS | Status: AC
  Filled 2017-03-27: qty 10

## 2017-03-27 MED ORDER — ADULT MULTIVITAMIN W/MINERALS CH
1.0000 | ORAL_TABLET | Freq: Every day | ORAL | Status: DC
  Filled 2017-03-27: qty 1

## 2017-03-27 MED ORDER — SUGAMMADEX SODIUM 200 MG/2ML IV SOLN
INTRAVENOUS | Status: AC
  Filled 2017-03-27: qty 2

## 2017-03-27 MED ORDER — ONDANSETRON HCL 4 MG/2ML IJ SOLN: 4.0000 mg | Freq: Four times a day (QID) | INTRAMUSCULAR | Status: DC | PRN

## 2017-03-27 MED ORDER — HEPARIN SODIUM (PORCINE) 1000 UNIT/ML IJ SOLN
INTRAMUSCULAR | Status: AC
  Filled 2017-03-27: qty 1

## 2017-03-27 MED ORDER — HEPARIN SODIUM (PORCINE) 1000 UNIT/ML IJ SOLN
INTRAMUSCULAR | Status: DC | PRN
  Administered 2017-03-27: 7000 [IU] via INTRAVENOUS
  Administered 2017-03-27: 2000 [IU] via INTRAVENOUS

## 2017-03-27 MED ORDER — ACETAMINOPHEN 325 MG PO TABS: 325.0000 mg | ORAL_TABLET | ORAL | Status: DC | PRN

## 2017-03-27 MED ORDER — PHENYLEPHRINE 40 MCG/ML (10ML) SYRINGE FOR IV PUSH (FOR BLOOD PRESSURE SUPPORT)
PREFILLED_SYRINGE | INTRAVENOUS | Status: DC | PRN
  Administered 2017-03-27 (×4): 80 ug via INTRAVENOUS

## 2017-03-27 MED ORDER — PHENYLEPHRINE 40 MCG/ML (10ML) SYRINGE FOR IV PUSH (FOR BLOOD PRESSURE SUPPORT)
PREFILLED_SYRINGE | INTRAVENOUS | Status: AC
  Filled 2017-03-27: qty 10

## 2017-03-27 MED ORDER — PHENOL 1.4 % MT LIQD: 1.0000 | OROMUCOSAL | Status: DC | PRN

## 2017-03-27 MED ORDER — 0.9 % SODIUM CHLORIDE (POUR BTL) OPTIME
TOPICAL | Status: DC | PRN
  Administered 2017-03-27: 1000 mL

## 2017-03-27 MED ORDER — PROPOFOL 10 MG/ML IV BOLUS
INTRAVENOUS | Status: AC
  Filled 2017-03-27: qty 20

## 2017-03-27 MED ORDER — GUAIFENESIN-DM 100-10 MG/5ML PO SYRP: 15.0000 mL | ORAL_SOLUTION | ORAL | Status: DC | PRN

## 2017-03-27 MED ORDER — FENTANYL CITRATE (PF) 100 MCG/2ML IJ SOLN: 25.0000 ug | INTRAMUSCULAR | Status: DC | PRN

## 2017-03-27 MED ORDER — HEPARIN SODIUM (PORCINE) 5000 UNIT/ML IJ SOLN
5000.0000 [IU] | Freq: Three times a day (TID) | INTRAMUSCULAR | Status: DC
  Administered 2017-03-27 – 2017-03-28 (×2): 5000 [IU] via SUBCUTANEOUS
  Filled 2017-03-27 (×2): qty 1

## 2017-03-27 MED ORDER — PROTAMINE SULFATE 10 MG/ML IV SOLN
INTRAVENOUS | Status: AC
  Filled 2017-03-27: qty 25

## 2017-03-27 MED ORDER — SODIUM CHLORIDE 0.9 % IV SOLN
INTRAVENOUS | Status: DC | PRN
  Administered 2017-03-27: 07:00:00

## 2017-03-27 MED ORDER — HYDRALAZINE HCL 20 MG/ML IJ SOLN: 5.0000 mg | INTRAMUSCULAR | Status: DC | PRN

## 2017-03-27 MED ORDER — ROCURONIUM BROMIDE 10 MG/ML (PF) SYRINGE
PREFILLED_SYRINGE | INTRAVENOUS | Status: AC
  Filled 2017-03-27: qty 5

## 2017-03-27 MED ORDER — MAGNESIUM SULFATE 2 GM/50ML IV SOLN
2.0000 g | Freq: Every day | INTRAVENOUS | Status: DC | PRN
  Filled 2017-03-27: qty 50

## 2017-03-27 MED ORDER — FENTANYL CITRATE (PF) 250 MCG/5ML IJ SOLN
INTRAMUSCULAR | Status: DC | PRN
  Administered 2017-03-27: 100 ug via INTRAVENOUS
  Administered 2017-03-27 (×2): 50 ug via INTRAVENOUS

## 2017-03-27 MED ORDER — POTASSIUM CHLORIDE CRYS ER 20 MEQ PO TBCR: 20.0000 meq | EXTENDED_RELEASE_TABLET | Freq: Every day | ORAL | Status: DC | PRN

## 2017-03-27 MED ORDER — AMLODIPINE BESYLATE 10 MG PO TABS
10.0000 mg | ORAL_TABLET | Freq: Every day | ORAL | Status: DC
Start: 2017-03-28 — End: 2017-03-28

## 2017-03-27 MED ORDER — LIDOCAINE 2% (20 MG/ML) 5 ML SYRINGE
INTRAMUSCULAR | Status: AC
  Filled 2017-03-27: qty 5

## 2017-03-27 MED ORDER — LIDOCAINE HCL (PF) 1 % IJ SOLN: INTRAMUSCULAR | Status: DC | PRN

## 2017-03-27 MED ORDER — ACETAMINOPHEN 650 MG RE SUPP: 325.0000 mg | RECTAL | Status: DC | PRN

## 2017-03-27 MED ORDER — PROTAMINE SULFATE 10 MG/ML IV SOLN
INTRAVENOUS | Status: AC
Start: 2017-03-27 — End: 2017-03-27
  Filled 2017-03-27: qty 5

## 2017-03-27 MED ORDER — SODIUM CHLORIDE 0.9 % IV SOLN: INTRAVENOUS | Status: DC

## 2017-03-27 MED ORDER — ONDANSETRON HCL 4 MG/2ML IJ SOLN: 4.0000 mg | Freq: Once | INTRAMUSCULAR | Status: DC | PRN

## 2017-03-27 MED ORDER — ROCURONIUM BROMIDE 10 MG/ML (PF) SYRINGE
PREFILLED_SYRINGE | INTRAVENOUS | Status: DC | PRN
  Administered 2017-03-27: 50 mg via INTRAVENOUS

## 2017-03-27 MED ORDER — ESTRADIOL 1 MG PO TABS
0.5000 mg | ORAL_TABLET | Freq: Every day | ORAL | Status: DC
  Filled 2017-03-27: qty 0.5

## 2017-03-27 MED ORDER — PHENYLEPHRINE HCL 10 MG/ML IJ SOLN
INTRAVENOUS | Status: DC | PRN
  Administered 2017-03-27: 20 ug/min via INTRAVENOUS

## 2017-03-27 MED ORDER — ONDANSETRON HCL 4 MG/2ML IJ SOLN
INTRAMUSCULAR | Status: DC | PRN
  Administered 2017-03-27: 4 mg via INTRAVENOUS

## 2017-03-27 MED ORDER — ONDANSETRON HCL 4 MG/2ML IJ SOLN
INTRAMUSCULAR | Status: AC
  Filled 2017-03-27: qty 8

## 2017-03-27 MED ORDER — HEMOSTATIC AGENTS (NO CHARGE) OPTIME
TOPICAL | Status: DC | PRN
  Administered 2017-03-27: 1 via TOPICAL

## 2017-03-27 MED ORDER — ALUM & MAG HYDROXIDE-SIMETH 200-200-20 MG/5ML PO SUSP: 15.0000 mL | ORAL | Status: DC | PRN

## 2017-03-27 MED ORDER — DOCUSATE SODIUM 100 MG PO CAPS: 100.0000 mg | ORAL_CAPSULE | Freq: Every day | ORAL | Status: DC

## 2017-03-27 MED ORDER — FENTANYL CITRATE (PF) 250 MCG/5ML IJ SOLN
INTRAMUSCULAR | Status: AC
  Filled 2017-03-27: qty 5

## 2017-03-27 MED ORDER — SODIUM CHLORIDE 0.9 % IV SOLN: 500.0000 mL | Freq: Once | INTRAVENOUS | Status: DC | PRN

## 2017-03-27 MED ORDER — SUCCINYLCHOLINE CHLORIDE 200 MG/10ML IV SOSY
PREFILLED_SYRINGE | INTRAVENOUS | Status: AC
  Filled 2017-03-27: qty 10

## 2017-03-27 MED ORDER — PROPOFOL 10 MG/ML IV BOLUS
INTRAVENOUS | Status: DC | PRN
  Administered 2017-03-27: 120 mg via INTRAVENOUS

## 2017-03-27 MED ORDER — EPINEPHRINE PF 1 MG/10ML IJ SOSY
PREFILLED_SYRINGE | INTRAMUSCULAR | Status: AC
  Filled 2017-03-27: qty 20

## 2017-03-27 MED ORDER — LABETALOL HCL 5 MG/ML IV SOLN: 10.0000 mg | INTRAVENOUS | Status: DC | PRN

## 2017-03-27 MED ORDER — CALCIUM CARBONATE-VITAMIN D 500-200 MG-UNIT PO TABS: 1.0000 | ORAL_TABLET | Freq: Every day | ORAL | Status: DC

## 2017-03-27 MED ORDER — MIDAZOLAM HCL 5 MG/5ML IJ SOLN
INTRAMUSCULAR | Status: DC | PRN
  Administered 2017-03-27 (×2): 1 mg via INTRAVENOUS

## 2017-03-27 MED ORDER — LACTATED RINGERS IV SOLN
INTRAVENOUS | Status: DC | PRN
  Administered 2017-03-27 (×2): via INTRAVENOUS

## 2017-03-27 SURGICAL SUPPLY — 53 items
ADH SKN CLS APL DERMABOND .7 (GAUZE/BANDAGES/DRESSINGS) ×1
ADPR TBG 2 MALE LL ART (MISCELLANEOUS)
CANISTER SUCT 3000ML PPV (MISCELLANEOUS) ×3 IMPLANT
CATH ROBINSON RED A/P 18FR (CATHETERS) ×3 IMPLANT
CLIP VESOCCLUDE MED 24/CT (CLIP) ×3 IMPLANT
CLIP VESOCCLUDE SM WIDE 24/CT (CLIP) ×3 IMPLANT
CRADLE DONUT ADULT HEAD (MISCELLANEOUS) ×3 IMPLANT
DERMABOND ADVANCED (GAUZE/BANDAGES/DRESSINGS) ×2
DERMABOND ADVANCED .7 DNX12 (GAUZE/BANDAGES/DRESSINGS) ×1 IMPLANT
DRAIN CHANNEL 15F RND FF W/TCR (WOUND CARE) IMPLANT
ELECT REM PT RETURN 9FT ADLT (ELECTROSURGICAL) ×3
ELECTRODE REM PT RTRN 9FT ADLT (ELECTROSURGICAL) ×1 IMPLANT
EVACUATOR SILICONE 100CC (DRAIN) IMPLANT
GLOVE BIO SURGEON STRL SZ 6.5 (GLOVE) ×2 IMPLANT
GLOVE BIO SURGEON STRL SZ7.5 (GLOVE) ×3 IMPLANT
GLOVE BIO SURGEONS STRL SZ 6.5 (GLOVE) ×2
GLOVE BIOGEL PI IND STRL 6.5 (GLOVE) IMPLANT
GLOVE BIOGEL PI IND STRL 8.5 (GLOVE) IMPLANT
GLOVE BIOGEL PI INDICATOR 6.5 (GLOVE) ×4
GLOVE BIOGEL PI INDICATOR 8.5 (GLOVE) ×2
GLOVE SS BIOGEL STRL SZ 8 (GLOVE) IMPLANT
GLOVE SUPERSENSE BIOGEL SZ 8 (GLOVE) ×2
GOWN STRL REUS W/ TWL LRG LVL3 (GOWN DISPOSABLE) ×2 IMPLANT
GOWN STRL REUS W/ TWL XL LVL3 (GOWN DISPOSABLE) ×1 IMPLANT
GOWN STRL REUS W/TWL LRG LVL3 (GOWN DISPOSABLE) ×6
GOWN STRL REUS W/TWL XL LVL3 (GOWN DISPOSABLE) ×6
HEMOSTAT SNOW SURGICEL 2X4 (HEMOSTASIS) ×2 IMPLANT
INSERT FOGARTY SM (MISCELLANEOUS) ×5 IMPLANT
IV ADAPTER SYR DOUBLE MALE LL (MISCELLANEOUS) ×1 IMPLANT
KIT BASIN OR (CUSTOM PROCEDURE TRAY) ×3 IMPLANT
KIT ROOM TURNOVER OR (KITS) ×3 IMPLANT
NDL HYPO 25GX1X1/2 BEV (NEEDLE) IMPLANT
NDL SPNL 20GX3.5 QUINCKE YW (NEEDLE) ×1 IMPLANT
NEEDLE HYPO 25GX1X1/2 BEV (NEEDLE) IMPLANT
NEEDLE SPNL 20GX3.5 QUINCKE YW (NEEDLE) IMPLANT
PACK CAROTID (CUSTOM PROCEDURE TRAY) ×3 IMPLANT
PAD ARMBOARD 7.5X6 YLW CONV (MISCELLANEOUS) ×6 IMPLANT
PATCH VASC XENOSURE 1CMX6CM (Vascular Products) ×3 IMPLANT
PATCH VASC XENOSURE 1X6 (Vascular Products) IMPLANT
SHUNT CAROTID BYPASS 12 (VASCULAR PRODUCTS) ×4 IMPLANT
STOPCOCK 4 WAY LG BORE MALE ST (IV SETS) ×2 IMPLANT
SUT ETHILON 3 0 PS 1 (SUTURE) IMPLANT
SUT MNCRL AB 4-0 PS2 18 (SUTURE) ×3 IMPLANT
SUT PROLENE 6 0 BV (SUTURE) ×7 IMPLANT
SUT SILK 3 0 (SUTURE)
SUT SILK 3-0 18XBRD TIE 12 (SUTURE) IMPLANT
SUT VIC AB 2-0 CT1 27 (SUTURE) ×3
SUT VIC AB 2-0 CT1 TAPERPNT 27 (SUTURE) ×1 IMPLANT
SUT VIC AB 3-0 SH 27 (SUTURE)
SUT VIC AB 3-0 SH 27X BRD (SUTURE) IMPLANT
SYR CONTROL 10ML LL (SYRINGE) IMPLANT
TUBING ART PRESS 48 MALE/FEM (TUBING) ×3 IMPLANT
WATER STERILE IRR 1000ML POUR (IV SOLUTION) ×3 IMPLANT

## 2017-03-27 NOTE — H&P (Signed)
   History and Physical Update  The patient was interviewed and re-examined.  The patient's previous History and Physical has been reviewed and is unchanged from office visit. Plan for left CEA. Discussed risks, benefits, alternatives and expected outcomes.   Brandon C. Donzetta Matters, MD [facility] Office: [phone] Pager: [phone]   03/27/2017, 7:02 AM

## 2017-03-27 NOTE — Telephone Encounter (Signed)
-----   Message from Mena Goes, RN sent at 03/27/2017  1:21 PM EDT ----- Regarding: 2-3 weeks   ----- Message ----- From: Gabriel Earing, PA-C Sent: 03/27/2017   1:17 PM To: Vvs Charge Pool  S/p left CEA.  F/u with Dr. Donzetta Matters in 2-3 weeks.

## 2017-03-27 NOTE — Progress Notes (Signed)
  Day of Surgery Note    Subjective:  No complaints   Vitals:   03/27/17 1200 03/27/17 1230  BP: (!) 147/60 (!) 129/58  Pulse: (!) 59 69  Resp: 20 15  Temp:    SpO2: 98% 95%    Incisions:   Clean and dry without hematoma Extremities:  Moving all extremities equally Cardiac:  regular Lungs:  Non labored Neuro:  In tact; tongue is midline   Assessment/Plan:  This is a 81 y.o. female who is s/p  Left internal carotid endarterectomy with bovine pericardial patch angioplasty  -pt doing well in recovery room -neuro is in tact -awaiting bed on [place] -anticipate discharge tomorrow   Leontine Locket, PA-C 03/27/2017 1:15 PM [phone]

## 2017-03-27 NOTE — Telephone Encounter (Signed)
Sched appt 04/16/17 at 10:15. Lm on cell#.

## 2017-03-27 NOTE — Anesthesia Procedure Notes (Signed)
Arterial Line Insertion Performed by: Mervyn Gay, CRNA  Patient location: Pre-op. Preanesthetic checklist: patient identified, IV checked, site marked, risks and benefits discussed, surgical consent, monitors and equipment checked, pre-op evaluation, timeout performed and anesthesia consent Lidocaine 1% used for infiltration Left, radial was placed Catheter size: 20 G Hand hygiene performed  and maximum sterile barriers used   Attempts: 1 Procedure performed without using ultrasound guided technique. Following insertion, dressing applied. Post procedure assessment: normal and unchanged  Patient tolerated the procedure well with no immediate complications.

## 2017-03-27 NOTE — Anesthesia Procedure Notes (Signed)
Procedure Name: Intubation Date/Time: 03/27/2017 7:38 AM Performed by: Mervyn Gay Pre-anesthesia Checklist: Patient identified, Patient being monitored, Timeout performed, Emergency Drugs available and Suction available Patient Re-evaluated:Patient Re-evaluated prior to induction Oxygen Delivery Method: Circle System Utilized Preoxygenation: Pre-oxygenation with 100% oxygen Induction Type: IV induction Ventilation: Mask ventilation without difficulty Laryngoscope Size: Miller and 3 Grade View: Grade I Tube type: Oral Tube size: 7.0 mm Number of attempts: 1 Airway Equipment and Method: Stylet Placement Confirmation: ETT inserted through vocal cords under direct vision,  positive ETCO2 and breath sounds checked- equal and bilateral Secured at: 19 cm Tube secured with: Tape Dental Injury: Teeth and Oropharynx as per pre-operative assessment

## 2017-03-27 NOTE — Anesthesia Postprocedure Evaluation (Signed)
Anesthesia Post Note  Patient: [name]  Procedure(s) Performed: ENDARTERECTOMY CAROTID LEFT (Left Neck) PATCH ANGIOPLASTY LEFT CAROTID ARTERY USING XENOSURE BIOLOGIC PATCH (Left Neck)     Patient location during evaluation: PACU Anesthesia Type: General Level of consciousness: awake and alert Pain management: pain level controlled Vital Signs Assessment: post-procedure vital signs reviewed and stable Respiratory status: spontaneous breathing, nonlabored ventilation, respiratory function stable and patient connected to nasal cannula oxygen Cardiovascular status: blood pressure returned to baseline and stable Postop Assessment: no apparent nausea or vomiting Anesthetic complications: no    Last Vitals:  Vitals:   03/27/17 1039 03/27/17 1054  BP: (!) 140/58 (!) 133/52  Pulse: 63 64  Resp: (!) 25 15  Temp:    SpO2: 94% 95%    Last Pain:  Vitals:   03/27/17 1030  TempSrc:   PainSc: 0-No pain                 Audry Pili

## 2017-03-27 NOTE — Op Note (Signed)
    Patient name: Lydia Graham MRN: [id number] DOB: Jul 07, 1935 Sex: female  03/27/2017 Pre-operative Diagnosis: asymptomatic left internal carotid stenosis Post-operative diagnosis:  Same Surgeon:  Erlene Quan C. Donzetta Matters, MD Assistant: Linus Orn, MD Procedure Performed: Left internal carotid endarterectomy with bovine pericardial patch angioplasty.  Indications:  81yo female with incidental finding of left carotid bruit with high grade asymptomatic stenosis of left ica.  Findings: The left ica was nearly occluded with focal proximal lesion. The disease extends into the common and feathered well distally. At completion there was flow into the ica throughout diastole.    Procedure:  The patient was identified in the holding area and taken to room 12 where she was placed supine on the table and general anesthesia was induced. She was given antibiotics and sterilely prepped and draped in the left neck, timeout was called. An incision was made along the anterior border of the [facility] and carried down to the common carotid artery. She was given 7000 units of heparin with act returning 224 and she was then given additional 2000 units. The CCA was encircled with umbilical tape. The ECA was dissected and vessel loop was placed around it. The ICA was then traced and hypoglossal nerve identified. The ansa was divided between clips and vessel loop placed around the ICA. The pressure was maintained greater than systolic [id number]. loop was then pulled on tension on the ICA and 11 blade used to open the carotid which was noted to be subtotally occluded with significant friable chronic disease. Positive used to extend the arteriotomy. The ICA was then backbled. The 12 shunt was then placed into the ICA backbled and then into the common carotid artery and flow was confirmed with Doppler. Endarterectomy was performed including eversion of the ECA. Distally it feathered nicely and proximally the plaque was trimmed. The patch  was then trimmed to size and sewn in place with 6-0 Prolene suture. Prior to completing anastomosis we removed our shunt and backbled our ECA and ICA separately and then foreword bled the common carotid artery. The bulb was flushed with heparinized saline. We then completed the anastomosis and had placed 2 repair stitches laterally. Doppler then confirmed continuous diastolic flow in the ICA. 50 mg protamine was administered and then hemostasis obtained and the wound was irrigated. This was closed with 3-0 Vicryl followed by skin with 4-0  Monocryl and Dermabond placed at the level of the skin. All counts were correct at completion. Patient was then allowed awaken from anesthesia having tolerated the procedure well was noted to be neurologically intact moving all extremities without any tongue deviation she was easily following commands.   Blood loss: 100cc   Brandon C. Donzetta Matters, MD [facility] [place] Office: [phone] Pager: [phone]

## 2017-03-27 NOTE — Transfer of Care (Signed)
Immediate Anesthesia Transfer of Care Note  Patient: Lydia Graham  Procedure(s) Performed: ENDARTERECTOMY CAROTID LEFT (Left Neck) PATCH ANGIOPLASTY LEFT CAROTID ARTERY USING XENOSURE BIOLOGIC PATCH (Left Neck)  Patient Location: PACU  Anesthesia Type:General  Level of Consciousness: awake, alert , oriented and patient cooperative  Airway & Oxygen Therapy: Patient Spontanous Breathing and Patient connected to nasal cannula oxygen  Post-op Assessment: Report given to RN, Post -op Vital signs reviewed and stable and Patient moving all extremities X 4  Post vital signs: Reviewed and stable  Last Vitals:  Vitals:   03/27/17 0546 03/27/17 0629  BP: (!) 179/63 (!) 167/61  Pulse: 62   Resp: 18   Temp: 36.6 C   SpO2: 99%     Last Pain:  Vitals:   03/27/17 0609  TempSrc:   PainSc: 3          Complications: No apparent anesthesia complications

## 2017-03-27 NOTE — Discharge Instructions (Signed)
° °  [facility] [place] ° °Discharge Instructions °  °Carotid Endarterectomy (CEA) ° °Please refer to the following instructions for your post-procedure care. Your surgeon or physician assistant will discuss any changes with you. ° °Activity ° °You are encouraged to walk as much as you can. You can slowly return to normal activities but must avoid strenuous activity and heavy lifting until your doctor tell you it's OK. Avoid activities such as vacuuming or swinging a golf club. You can drive after one week if you are comfortable and you are no longer taking prescription pain medications. It is normal to feel tired for serval weeks after your surgery. It is also normal to have difficulty with sleep habits, eating, and bowel movements after surgery. These will go away with time. ° °Bathing/Showering ° °You may shower after you go home. Do not soak in a bathtub, hot tub, or swim until the incision heals completely. ° °Incision Care ° °Shower every day. Clean your incision with mild soap and water. Pat the area dry with a clean towel. You do not need a bandage unless otherwise instructed. Do not apply any ointments or creams to your incision. You may have skin glue on your incision. Do not peel it off. It will come off on its own in about one week. Your incision may feel thickened and raised for several weeks after your surgery. This is normal and the skin will soften over time. For Men Only: It's OK to shave around the incision but do not shave the incision itself for 2 weeks. It is common to have numbness under your chin that could last for several months. ° °Diet ° °Resume your normal diet. There are no special food restrictions following this procedure. A low fat/low cholesterol diet is recommended for all patients with vascular disease. In order to heal from your surgery, it is CRITICAL to get adequate nutrition. Your body requires vitamins, minerals, and protein. Vegetables are the best  source of vitamins and minerals. Vegetables also provide the perfect balance of protein. Processed food has little nutritional value, so try to avoid this. ° °Medications ° °Resume taking all of your medications unless your doctor or physician assistant tells you not to. If your incision is causing pain, you may take over-the- counter pain relievers such as acetaminophen (Tylenol). If you were prescribed a stronger pain medication, please be aware these medications can cause nausea and constipation. Prevent nausea by taking the medication with a snack or meal. Avoid constipation by drinking plenty of fluids and eating foods with a high amount of fiber, such as fruits, vegetables, and grains. Do not take Tylenol if you are taking prescription pain medications. ° °Follow Up ° °Our office will schedule a follow up appointment 2-3 weeks following discharge. ° °Please call us immediately for any of the following conditions ° °Increased pain, redness, drainage (pus) from your incision site. °Fever of 101 degrees or higher. °If you should develop stroke (slurred speech, difficulty swallowing, weakness on one side of your body, loss of vision) you should call 911 and go to the nearest emergency room. ° °Reduce your risk of vascular disease: ° °Stop smoking. If you would like help call [facility] at 1-800-QUIT-NOW (1-800-784-8669) or [place] at 336-586-4000. °Manage your cholesterol °Maintain a desired weight °Control your diabetes °Keep your blood pressure down ° °If you have any questions, please call the office at [phone]. ° °

## 2017-03-27 NOTE — Anesthesia Procedure Notes (Deleted)
Performed by: Stacyann Mcconaughy SALOMON       

## 2017-03-28 ENCOUNTER — Encounter (HOSPITAL_COMMUNITY): Payer: Self-pay | Admitting: Vascular Surgery

## 2017-03-28 LAB — BASIC METABOLIC PANEL
Anion gap: 5 (ref 5–15)
BUN: 18 mg/dL (ref 6–20)
CHLORIDE: 102 mmol/L (ref 101–111)
CO2: 23 mmol/L (ref 22–32)
CREATININE: 1.11 mg/dL — AB (ref 0.44–1.00)
Calcium: 8.4 mg/dL — ABNORMAL LOW (ref 8.9–10.3)
GFR calc Af Amer: 53 mL/min — ABNORMAL LOW (ref >60)
GFR calc non Af Amer: 46 mL/min — ABNORMAL LOW (ref >60)
Glucose, Bld: 95 mg/dL (ref 65–99)
POTASSIUM: 3.9 mmol/L (ref 3.5–5.1)
Sodium: 130 mmol/L — ABNORMAL LOW (ref 135–145)

## 2017-03-28 LAB — CBC
HEMATOCRIT: 32.2 % — AB (ref 36.0–46.0)
HEMOGLOBIN: 10.8 g/dL — AB (ref 12.0–15.0)
MCH: 29.5 pg (ref 26.0–34.0)
MCHC: 33.5 g/dL (ref 30.0–36.0)
MCV: 88 fL (ref 78.0–100.0)
Platelets: 207 10*3/uL (ref 150–400)
RBC: 3.66 MIL/uL — AB (ref 3.87–5.11)
RDW: 14 % (ref 11.5–15.5)
WBC: 8.6 10*3/uL (ref 4.0–10.5)

## 2017-03-28 MED ORDER — ADULT MULTIVITAMIN W/MINERALS CH: 1.0000 | ORAL_TABLET | Freq: Every day | ORAL | Status: DC

## 2017-03-28 MED ORDER — OXYCODONE-ACETAMINOPHEN 5-325 MG PO TABS: 1.0000 | ORAL_TABLET | Freq: Four times a day (QID) | ORAL | 0 refills | Status: DC | PRN

## 2017-03-28 NOTE — Discharge Summary (Signed)
Discharge Summary     CLAUDINE STALLINGS July 24, 1935 81 y.o. female  [id number]  Admission Date: 03/27/2017  Discharge Date: 03/28/17  Physician: Thomes Lolling*  Admission Diagnosis: Left ICA stenosis  165.22   HPI:   This is a 81 y.o. female with history of hypertension and arthritis and a long history of right ear pain. She was recently seen by her in those and throat doctor and was complaining of hearing her heart beat in her left ear. She was at that time found to have a bruit on the left side and was referred to Dr. Bronson Ing for evaluation of carotid artery disease which time she was found to have high-grade left ICA stenosis. She now presents for further evaluation and possible intervention. She is never had stroke TIA or amaurosis. She remains active walking 2 miles several times per week and can also walk steps. She has no chest pain. She is nondiabetic and has never been a smoker. She does have issues with statin drugs typically causing severe muscle pains that worsen her arthritis. She does take aspirin daily since being worked up for her carotid artery disease.  Hospital Course:  The patient was admitted to the hospital and taken to the operating room on 03/27/2017 and underwent left carotid endarterectomy.  The pt tolerated the procedure well and was transported to the PACU in good condition.   By POD 1, the pt neuro status is in tact.  She states that her voice is a little raspy this am but denies any trouble swallowing.  She is moving all extremities equally.    The remainder of the hospital course consisted of increasing mobilization and increasing intake of solids without difficulty.    Recent Labs  03/25/17 1418 03/28/17 0315  NA 133* 130*  K 4.2 3.9  CL 100* 102  CO2 25 23  GLUCOSE 97 95  BUN 17 18  CALCIUM 9.4 8.4*    Recent Labs  03/27/17 1705 03/28/17 0315  WBC 6.5 8.6  HGB 12.2 10.8*  HCT 35.5* 32.2*  PLT 199 207    Recent  Labs  03/25/17 1418  INR 1.09       Discharge Diagnosis:  Left ICA stenosis  165.22  Secondary Diagnosis: Patient Active Problem List   Diagnosis Date Noted  . Carotid stenosis 03/27/2017  . S/P total knee arthroplasty, right 10/23/2016  . Unilateral primary osteoarthritis, right knee 07/13/2016   Past Medical History:  Diagnosis Date  . Arthritis    oa, all over - multiple areas   . GERD (gastroesophageal reflux disease)   . Headache    h/o migraines   . Hypertension     Allergies as of 03/28/2017      Reactions   Rosuvastatin Other (See Comments)   MYALGIA's Muscle pain   Other    PAIN MEDICATIONS/ANESTHESIA  MADE BP DROP LOW       Medication List    TAKE these medications   amLODipine 10 MG tablet Commonly known as:  NORVASC Take 10 mg by mouth daily at 12 noon.   ASPERCREME LIDOCAINE 4 % Liqd Generic drug:  Lidocaine HCl Apply 1 application topically as needed (for pain).   aspirin EC 81 MG tablet Take 81 mg by mouth daily.   butalbital-acetaminophen-caffeine 50-325-40 MG tablet Commonly known as:  FIORICET, ESGIC Take 1 tablet by mouth every 6 (six) hours as needed for headache or migraine.   calcium-vitamin D 500-200 MG-UNIT tablet Commonly known as:  OSCAL  WITH D Take 1 tablet by mouth daily.   carvedilol 25 MG tablet Commonly known as:  COREG Take 25 mg by mouth 2 (two) times daily with a meal.   estradiol 0.5 MG tablet Commonly known as:  ESTRACE Take 0.5 mg by mouth daily.   Fish Oil 1000 MG Caps Take 1,000 mg by mouth daily.   fluticasone 50 MCG/ACT nasal spray Commonly known as:  FLONASE Place 1 spray into both nostrils daily.   losartan 100 MG tablet Commonly known as:  COZAAR Take 100 mg by mouth daily at 12 noon.   magnesium hydroxide 400 MG/5ML suspension Commonly known as:  MILK OF MAGNESIA Take 30 mLs by mouth daily as needed for mild constipation.   multivitamin with minerals tablet Take 1 tablet by mouth daily.    naproxen sodium 220 MG tablet Commonly known as:  ANAPROX Take 550 mg by mouth daily with breakfast. Takes 2.5 tablets   oxyCODONE-acetaminophen 5-325 MG tablet Commonly known as:  PERCOCET/ROXICET Take 1 tablet by mouth every 6 (six) hours as needed for moderate pain.   pantoprazole 40 MG tablet Commonly known as:  PROTONIX Take 40 mg by mouth daily before breakfast.   VITAMIN B 12 PO Take 1 tablet by mouth daily.        Discharge Instructions: .sjrdc  [facility] Discharge Instructions Carotid Endarterectomy (CEA)  Please refer to the following instructions for your post-procedure care. Your surgeon or physician assistant will discuss any changes with you.  Activity  You are encouraged to walk as much as you can. You can slowly return to normal activities but must avoid strenuous activity and heavy lifting until your doctor tell you it's OK. Avoid activities such as vacuuming or swinging a golf club. You can drive after one week if you are comfortable and you are no longer taking prescription pain medications. It is normal to feel tired for serval weeks after your surgery. It is also normal to have difficulty with sleep habits, eating, and bowel movements after surgery. These will go away with time.  Bathing/Showering  You may shower after you come home. Do not soak in a bathtub, hot tub, or swim until the incision heals completely.  Incision Care  Shower every day. Clean your incision with mild soap and water. Pat the area dry with a clean towel. You do not need a bandage unless otherwise instructed. Do not apply any ointments or creams to your incision. You may have skin glue on your incision. Do not peel it off. It will come off on its own in about one week. Your incision may feel thickened and raised for several weeks after your surgery. This is normal and the skin will soften over time. For Men Only: It's OK to shave around the incision  but do not shave the incision itself for 2 weeks. It is common to have numbness under your chin that could last for several months.  Diet  Resume your normal diet. There are no special food restrictions following this procedure. A low fat/low cholesterol diet is recommended for all patients with vascular disease. In order to heal from your surgery, it is CRITICAL to get adequate nutrition. Your body requires vitamins, minerals, and protein. Vegetables are the best source of vitamins and minerals. Vegetables also provide the perfect balance of protein. Processed food has little nutritional value, so try to avoid this.  Medications  Resume taking all of your medications unless your doctor or  physician assistant tells you not to.  If your incision is causing pain, you may take over-the- counter pain relievers such as acetaminophen (Tylenol). If you were prescribed a stronger pain medication, please be aware these medications can cause nausea and constipation.  Prevent nausea by taking the medication with a snack or meal. Avoid constipation by drinking plenty of fluids and eating foods with a high amount of fiber, such as fruits, vegetables, and grains. Do not take Tylenol if you are taking prescription pain medications.  Follow Up  Our office will schedule a follow up appointment 2-3 weeks following discharge.  Please call us immediately for any of the following conditions  Increased pain, redness, drainage (pus) from your incision site. Fever of 101 degrees or higher. If you should develop stroke (slurred speech, difficulty swallowing, weakness on one side of your body, loss of vision) you should call 911 and go to the nearest emergency room.  Reduce your risk of vascular disease:  Stop smoking. If you would like help call [facility] at 1-800-QUIT-NOW 418-615-0902) or [place] at [phone]. Manage your cholesterol Maintain a desired weight Control your diabetes Keep your blood  pressure down  If you have any questions, please call the office at [phone].  Prescriptions given: Roxicet #8 No Refill  Disposition: home  Patient's condition: is Good  Follow up: 1. Dr. Donzetta Matters in 2 weeks.   Leontine Locket, PA-C [facility] [phone]   --- For [facility] use ---   Modified Rankin score at D/C (0-6): 0  IV medication needed for:  1. Hypertension: No 2. Hypotension: No  Post-op Complications: No  1. Post-op CVA or TIA: No  If yes: Event classification (right eye, left eye, right cortical, left cortical, verterobasilar, other): n/a  If yes: Timing of event (intra-op, <6 hrs post-op, >=6 hrs post-op, unknown): n/a  2. CN injury: No  If yes: CN n/a injuried   3. Myocardial infarction: No  If yes: Dx by (EKG or clinical, Troponin): n/a  4.  CHF: No  5.  Dysrhythmia (new): No  6. Wound infection: No  7. Reperfusion symptoms: No  8. Return to OR: No  If yes: return to OR for (bleeding, neurologic, other CEA incision, other): n/a  Discharge medications: Statin use:  No ASA use:  Yes   Beta blocker use:  Yes ACE-Inhibitor use:  No  ARB use:  Yes CCB use: No P2Y12 Antagonist use: No, [ ]  Plavix, [ ]  Plasugrel, [ ]  Ticlopinine, [ ]  Ticagrelor, [ ]  Other, [ ]  No for medical reason, [ ]  Non-compliant, [ ]  Not-indicated Anti-coagulant use:  No, [ ]  Warfarin, [ ]  Rivaroxaban, [ ]  Dabigatran,

## 2017-03-28 NOTE — Progress Notes (Addendum)
  Progress Note    03/28/2017 7:27 AM 1 Day Post-Op  Subjective:  No complaints; says her voice is a little raspy this am, but denies any trouble swallowing.  Afebrile HR  60's-80's NSR 213'Y-865'H systolic 84% RA Vitals:   03/28/17 0020 03/28/17 0506  BP: (!) 119/58 (!) 131/53  Pulse: 80 73  Resp: 18 19  Temp: 98.4 F (36.9 C) 98.9 F (37.2 C)  SpO2: 95% 95%     Physical Exam: Neuro:  In tact; tongue is midline; moving all extremities equally Lungs:  Non labored Incision:  Clean and dry without hematoma  CBC    Component Value Date/Time   WBC 8.6 03/28/2017 0315   RBC 3.66 (L) 03/28/2017 0315   HGB 10.8 (L) 03/28/2017 0315   HCT 32.2 (L) 03/28/2017 0315   PLT 207 03/28/2017 0315   MCV 88.0 03/28/2017 0315   MCH 29.5 03/28/2017 0315   MCHC 33.5 03/28/2017 0315   RDW 14.0 03/28/2017 0315    BMET    Component Value Date/Time   NA 130 (L) 03/28/2017 0315   K 3.9 03/28/2017 0315   CL 102 03/28/2017 0315   CO2 23 03/28/2017 0315   GLUCOSE 95 03/28/2017 0315   BUN 18 03/28/2017 0315   CREATININE 1.11 (H) 03/28/2017 0315   CALCIUM 8.4 (L) 03/28/2017 0315   GFRNONAA 46 (L) 03/28/2017 0315   GFRAA 53 (L) 03/28/2017 0315     Intake/Output Summary (Last 24 hours) at 03/28/17 0727 Last data filed at 03/28/17 0545  Gross per 24 hour  Intake             1550 ml  Output             1150 ml  Net              400 ml     Assessment/Plan:  This is a 81 y.o. female who is s/p left CEA 1 Day Post-Op  -pt is doing well this am. -pt neuro exam is in tact -pt has ambulated to the beside commode but not in hallways -pt has voided -f/u with Dr. Donzetta Matters in 2 weeks.   Leontine Locket, PA-C [facility] [phone]   I have independently interviewed and examined patient and agree with PA assessment and plan above.   Rosslyn Pasion C. Donzetta Matters, MD [facility] [place] Office: [phone] Pager: [phone]

## 2017-03-28 NOTE — Progress Notes (Signed)
Lydia Graham to be D/C'd Home per MD order. Discussed with the patient and all questions fully answered.    VVS, Skin clean, dry and intact without evidence of skin break down, no evidence of skin tears noted.  IV catheter discontinued intact. Site without signs and symptoms of complications. Dressing and pressure applied.  An After Visit Summary was printed and given to the patient.  Patient escorted via [place], and D/C home via private auto.  Cyndra Numbers  03/28/2017 10:39 AM

## 2017-04-02 DIAGNOSIS — Z299 Encounter for prophylactic measures, unspecified: Secondary | ICD-10-CM | POA: Diagnosis not present

## 2017-04-02 DIAGNOSIS — I10 Essential (primary) hypertension: Secondary | ICD-10-CM | POA: Diagnosis not present

## 2017-04-02 DIAGNOSIS — Z6824 Body mass index (BMI) 24.0-24.9, adult: Secondary | ICD-10-CM | POA: Diagnosis not present

## 2017-04-02 DIAGNOSIS — Z713 Dietary counseling and surveillance: Secondary | ICD-10-CM | POA: Diagnosis not present

## 2017-04-08 DIAGNOSIS — Z6824 Body mass index (BMI) 24.0-24.9, adult: Secondary | ICD-10-CM | POA: Diagnosis not present

## 2017-04-08 DIAGNOSIS — Z79899 Other long term (current) drug therapy: Secondary | ICD-10-CM | POA: Diagnosis not present

## 2017-04-08 DIAGNOSIS — Z1211 Encounter for screening for malignant neoplasm of colon: Secondary | ICD-10-CM | POA: Diagnosis not present

## 2017-04-08 DIAGNOSIS — Z299 Encounter for prophylactic measures, unspecified: Secondary | ICD-10-CM | POA: Diagnosis not present

## 2017-04-08 DIAGNOSIS — E7800 Pure hypercholesterolemia, unspecified: Secondary | ICD-10-CM | POA: Diagnosis not present

## 2017-04-08 DIAGNOSIS — Z7189 Other specified counseling: Secondary | ICD-10-CM | POA: Diagnosis not present

## 2017-04-08 DIAGNOSIS — I10 Essential (primary) hypertension: Secondary | ICD-10-CM | POA: Diagnosis not present

## 2017-04-08 DIAGNOSIS — Z1331 Encounter for screening for depression: Secondary | ICD-10-CM | POA: Diagnosis not present

## 2017-04-08 DIAGNOSIS — Z0000 Encounter for general adult medical examination without abnormal findings: Secondary | ICD-10-CM | POA: Diagnosis not present

## 2017-04-08 DIAGNOSIS — R5383 Other fatigue: Secondary | ICD-10-CM | POA: Diagnosis not present

## 2017-04-08 DIAGNOSIS — Z1339 Encounter for screening examination for other mental health and behavioral disorders: Secondary | ICD-10-CM | POA: Diagnosis not present

## 2017-04-16 ENCOUNTER — Ambulatory Visit (INDEPENDENT_AMBULATORY_CARE_PROVIDER_SITE_OTHER): Payer: Self-pay | Admitting: Vascular Surgery

## 2017-04-16 ENCOUNTER — Encounter: Payer: Self-pay | Admitting: Vascular Surgery

## 2017-04-16 VITALS — BP 173/77 | HR 62 | Temp 97.0°F | Resp 16 | Ht 67.0 in | Wt 150.0 lb

## 2017-04-16 DIAGNOSIS — I6522 Occlusion and stenosis of left carotid artery: Secondary | ICD-10-CM

## 2017-04-16 NOTE — Progress Notes (Signed)
Subjective:     Patient ID: Lydia Graham, female   DOB: 09/23/1935, 81 y.o.   MRN: [id number]  HPI 81 year old female who is status post left sided carotid endarterectomy for asymptomatic very high-grade stenosis.  She has healed very well and her voice has completely returned to normal and she has no neurologic symptoms.  She has no complaints related to today's visit and is back to her regular level of activity.  She is taking aspirin is intolerant of statins.   Review of Systems No complaints today    Objective:   Physical Exam a[place]3 Neuro in tact Left neck incision cdi, dermabond present    Assessment/plan     81 year old status post left carotid endarterectomy for asymptomatic disease.  She has healed well.  She is taking aspirin.  We will see her in 6 months with repeat bilateral carotid artery duplex.  Should he have issues before that we will certainly see her sooner.    Lismary Kiehn C. Donzetta Matters, MD [facility] [place] Office: [phone] Pager: [phone]

## 2017-04-16 NOTE — Progress Notes (Signed)
Vitals:   04/16/17 1033 04/16/17 1037 04/16/17 1038  BP: (!) 172/73 (!) 168/78 (!) 173/77  Pulse: 62 64 62  Resp: 16    Temp: (!) 97 F (36.1 C)    SpO2: 98%    Weight: 150 lb (68 kg)    Height: 5\' 7"  (1.702 m)

## 2017-04-23 DIAGNOSIS — H16223 Keratoconjunctivitis sicca, not specified as Sjogren's, bilateral: Secondary | ICD-10-CM | POA: Diagnosis not present

## 2017-04-23 DIAGNOSIS — H5203 Hypermetropia, bilateral: Secondary | ICD-10-CM | POA: Diagnosis not present

## 2017-04-23 DIAGNOSIS — H52223 Regular astigmatism, bilateral: Secondary | ICD-10-CM | POA: Diagnosis not present

## 2017-04-23 DIAGNOSIS — H2513 Age-related nuclear cataract, bilateral: Secondary | ICD-10-CM | POA: Diagnosis not present

## 2017-04-30 NOTE — Addendum Note (Signed)
Addended by: Lianne Cure A on: 04/30/2017 03:07 PM   Modules accepted: Orders

## 2017-05-07 ENCOUNTER — Encounter: Payer: Self-pay | Admitting: *Deleted

## 2017-05-26 DIAGNOSIS — D0439 Carcinoma in situ of skin of other parts of face: Secondary | ICD-10-CM | POA: Diagnosis not present

## 2017-05-26 DIAGNOSIS — D485 Neoplasm of uncertain behavior of skin: Secondary | ICD-10-CM | POA: Diagnosis not present

## 2017-06-03 DIAGNOSIS — R350 Frequency of micturition: Secondary | ICD-10-CM | POA: Diagnosis not present

## 2017-06-03 DIAGNOSIS — Z6825 Body mass index (BMI) 25.0-25.9, adult: Secondary | ICD-10-CM | POA: Diagnosis not present

## 2017-06-03 DIAGNOSIS — Z713 Dietary counseling and surveillance: Secondary | ICD-10-CM | POA: Diagnosis not present

## 2017-06-03 DIAGNOSIS — Z299 Encounter for prophylactic measures, unspecified: Secondary | ICD-10-CM | POA: Diagnosis not present

## 2017-06-03 DIAGNOSIS — N390 Urinary tract infection, site not specified: Secondary | ICD-10-CM | POA: Diagnosis not present

## 2017-06-20 DIAGNOSIS — Z6825 Body mass index (BMI) 25.0-25.9, adult: Secondary | ICD-10-CM | POA: Diagnosis not present

## 2017-06-20 DIAGNOSIS — C44329 Squamous cell carcinoma of skin of other parts of face: Secondary | ICD-10-CM | POA: Diagnosis not present

## 2017-06-20 DIAGNOSIS — C44320 Squamous cell carcinoma of skin of unspecified parts of face: Secondary | ICD-10-CM | POA: Diagnosis not present

## 2017-06-20 DIAGNOSIS — Z299 Encounter for prophylactic measures, unspecified: Secondary | ICD-10-CM | POA: Diagnosis not present

## 2017-07-15 DIAGNOSIS — M79672 Pain in left foot: Secondary | ICD-10-CM | POA: Diagnosis not present

## 2017-07-15 DIAGNOSIS — M19072 Primary osteoarthritis, left ankle and foot: Secondary | ICD-10-CM | POA: Diagnosis not present

## 2017-08-14 ENCOUNTER — Encounter: Payer: Self-pay | Admitting: Cardiovascular Disease

## 2017-08-14 ENCOUNTER — Ambulatory Visit (INDEPENDENT_AMBULATORY_CARE_PROVIDER_SITE_OTHER): Payer: Medicare Other | Admitting: Cardiovascular Disease

## 2017-08-14 VITALS — BP 140/90 | HR 67 | Ht 67.0 in | Wt 152.0 lb

## 2017-08-14 DIAGNOSIS — Z9889 Other specified postprocedural states: Secondary | ICD-10-CM | POA: Diagnosis not present

## 2017-08-14 DIAGNOSIS — I10 Essential (primary) hypertension: Secondary | ICD-10-CM | POA: Diagnosis not present

## 2017-08-14 MED ORDER — CHLORTHALIDONE 25 MG PO TABS: 25.0000 mg | ORAL_TABLET | Freq: Every day | ORAL | 6 refills | Status: DC

## 2017-08-14 NOTE — Progress Notes (Signed)
SUBJECTIVE: The patient presents for routine follow-up.  She underwent left carotid endarterectomy on 03/27/17. She also has a history of hypertension.  She brought in her blood pressure log which I reviewed.  Most blood pressures range from 170-180/70 range.  She denies chest pain, palpitations, leg swelling, and shortness of breath. She takes naproxen 220 mg every morning due to diffuse arthritis, primarily of the hands.    Review of Systems: As per "subjective", otherwise negative.  Allergies  Allergen Reactions  . Rosuvastatin Other (See Comments)    MYALGIA's Muscle pain  . Other     PAIN MEDICATIONS/ANESTHESIA  MADE BP DROP LOW     Current Outpatient Medications  Medication Sig Dispense Refill  . amLODipine (NORVASC) 10 MG tablet Take 10 mg by mouth daily at 12 noon.     Marland Kitchen aspirin EC 81 MG tablet Take 81 mg by mouth daily.    . butalbital-acetaminophen-caffeine (FIORICET, ESGIC) 50-325-40 MG tablet Take 1 tablet by mouth every 6 (six) hours as needed for headache or migraine.     . calcium-vitamin D (OSCAL WITH D) 500-200 MG-UNIT tablet Take 1 tablet by mouth daily.    . carvedilol (COREG) 25 MG tablet Take 25 mg by mouth 2 (two) times daily with a meal.     . Cyanocobalamin (VITAMIN B 12 PO) Take 1 tablet by mouth daily.     Marland Kitchen estradiol (ESTRACE) 0.5 MG tablet Take 0.5 mg by mouth daily.     . fluticasone (FLONASE) 50 MCG/ACT nasal spray Place 1 spray into both nostrils daily.    . Lidocaine HCl (ASPERCREME LIDOCAINE) 4 % LIQD Apply 1 application topically as needed (for pain).    Marland Kitchen losartan (COZAAR) 100 MG tablet Take 100 mg by mouth daily at 12 noon.     . magnesium hydroxide (MILK OF MAGNESIA) 400 MG/5ML suspension Take 30 mLs by mouth daily as needed for mild constipation.    . Multiple Vitamins-Minerals (MULTIVITAMIN WITH MINERALS) tablet Take 1 tablet by mouth daily.    . naproxen sodium (ANAPROX) 220 MG tablet Take 550 mg by mouth daily with breakfast. Takes  2.5 tablets    . Omega-3 Fatty Acids (FISH OIL) 1000 MG CAPS Take 1,000 mg by mouth daily.     . pantoprazole (PROTONIX) 40 MG tablet Take 40 mg by mouth daily before breakfast.      No current facility-administered medications for this visit.     Past Medical History:  Diagnosis Date  . Arthritis    oa, all over - multiple areas   . GERD (gastroesophageal reflux disease)   . Headache    h/o migraines   . Hypertension     Past Surgical History:  Procedure Laterality Date  . ABDOMINAL HYSTERECTOMY    . APPENDECTOMY    . BACK SURGERY    . ENDARTERECTOMY Left 03/27/2017   Procedure: ENDARTERECTOMY CAROTID LEFT;  Surgeon: [name], MD;  Location: [facility];  Service: Vascular;  Laterality: Left;  . INNER EAR SURGERY Right    puntured eardrum- repaired 2x's   . NASAL SINUS SURGERY     deviated septum  . PATCH ANGIOPLASTY Left 03/27/2017   Procedure: PATCH ANGIOPLASTY LEFT CAROTID ARTERY USING Rueben Bash BIOLOGIC PATCH;  Surgeon: [name], MD;  Location: [facility];  Service: Vascular;  Laterality: Left;  . TONSILLECTOMY    . TOTAL KNEE ARTHROPLASTY Right 10/23/2016   Procedure: RIGHT TOTAL KNEE ARTHROPLASTY;  Surgeon: [name],  MD;  Location: [facility];  Service: Orthopedics;  Laterality: Right;    Social History   Socioeconomic History  . Marital status: Single    Spouse name: Not on file  . Number of children: Not on file  . Years of education: Not on file  . Highest education level: Not on file  Social Needs  . Financial resource strain: Not on file  . Food insecurity - worry: Not on file  . Food insecurity - inability: Not on file  . Transportation needs - medical: Not on file  . Transportation needs - non-medical: Not on file  Occupational History  . Not on file  Tobacco Use  . Smoking status: Never Smoker  . Smokeless tobacco: Never Used  Substance and Sexual Activity  . Alcohol use: No  . Drug use: No  . Sexual activity: Not on file    Other Topics Concern  . Not on file  Social History Narrative  . Not on file     Vitals:   08/14/17 1431  BP: 140/90  Pulse: 67  SpO2: 97%  Weight: 152 lb (68.9 kg)  Height: 5\' 7"  (1.702 m)    Wt Readings from Last 3 Encounters:  08/14/17 152 lb (68.9 kg)  04/16/17 150 lb (68 kg)  03/27/17 151 lb (68.5 kg)     PHYSICAL EXAM General: NAD HEENT: Normal. Neck: No JVD, no thyromegaly. Lungs: Clear to auscultation bilaterally with normal respiratory effort. CV: Regular rate and rhythm, normal S1/S2, no S3/S4, no murmur. No pretibial or periankle edema.  No carotid bruit.   Abdomen: Soft, nontender, no distention.  Neurologic: Alert and oriented.  Psych: Normal affect. Skin: Normal. Musculoskeletal: Bouchard and [place] nodes in bilateral hands.    ECG: Most recent ECG reviewed.   Labs: Lab Results  Component Value Date/Time   K 3.9 03/28/2017 03:15 AM   BUN 18 03/28/2017 03:15 AM   CREATININE 1.11 (H) 03/28/2017 03:15 AM   ALT 13 (L) 03/25/2017 02:18 PM   HGB 10.8 (L) 03/28/2017 03:15 AM     Lipids: No results found for: LDLCALC, LDLDIRECT, CHOL, TRIG, HDL     ASSESSMENT AND PLAN: 1.  High-grade left internal carotid artery stenosis status post carotid endarterectomy in October 2018: Stable.  Continue aspirin.  2.  Accelerated hypertension: Blood pressure is 140/90 today with most readings in the 170-180/70 range.  Currently on amlodipine 10 mg, carvedilol 25 mg twice daily, and losartan 100 mg.  She also takes naproxen 220 mg every morning for arthritis.  BUN 18, creatinine 1.11 on 03/28/17. I will start chlorthalidone 25 mg every morning.  I will obtain a basic metabolic panel within 3 days.  I will also provide her with a blood pressure log to check blood pressures 3-4 times per week for the next 6 weeks.   Disposition: Follow up 2 months   Kate Sable, M.D., F.A.C.C.

## 2017-08-14 NOTE — Addendum Note (Signed)
Addended by: Laurine Blazer on: 08/14/2017 03:18 PM   Modules accepted: Orders

## 2017-08-14 NOTE — Patient Instructions (Signed)
Medication Instructions:   Begin Chlorthalidone 25mg  daily.   Continue all other medications.    Labwork:  BMET - order given today.   Do in approximately 3 days.   Office will contact with results via phone or letter.    Testing/Procedures: none  Follow-Up: 2 months   Any Other Special Instructions Will Be Listed Below (If Applicable). Your physician has requested that you regularly monitor and record your blood pressure readings at home. Please take blood pressure readings 3-4 x week for 6 weeks.  Please return readings to office for MD review.    If you need a refill on your cardiac medications before your next appointment, please call your pharmacy.

## 2017-08-18 DIAGNOSIS — I10 Essential (primary) hypertension: Secondary | ICD-10-CM | POA: Diagnosis not present

## 2017-08-19 ENCOUNTER — Telehealth: Payer: Self-pay | Admitting: *Deleted

## 2017-08-19 NOTE — Telephone Encounter (Signed)
Patient informed. 

## 2017-08-19 NOTE — Telephone Encounter (Signed)
-----   Message from [facility] sent at 08/18/2017  2:25 PM EST -----   ----- Message ----- From: Herminio Commons, MD Sent: 08/18/2017   2:12 PM To: Staci T Ashworth, CMA  ok

## 2017-08-21 DIAGNOSIS — J329 Chronic sinusitis, unspecified: Secondary | ICD-10-CM | POA: Diagnosis not present

## 2017-08-21 DIAGNOSIS — Z789 Other specified health status: Secondary | ICD-10-CM | POA: Diagnosis not present

## 2017-08-21 DIAGNOSIS — I10 Essential (primary) hypertension: Secondary | ICD-10-CM | POA: Diagnosis not present

## 2017-08-21 DIAGNOSIS — Z299 Encounter for prophylactic measures, unspecified: Secondary | ICD-10-CM | POA: Diagnosis not present

## 2017-08-21 DIAGNOSIS — E785 Hyperlipidemia, unspecified: Secondary | ICD-10-CM | POA: Diagnosis not present

## 2017-08-21 DIAGNOSIS — Z6825 Body mass index (BMI) 25.0-25.9, adult: Secondary | ICD-10-CM | POA: Diagnosis not present

## 2017-08-26 DIAGNOSIS — M79672 Pain in left foot: Secondary | ICD-10-CM | POA: Diagnosis not present

## 2017-08-26 DIAGNOSIS — M19072 Primary osteoarthritis, left ankle and foot: Secondary | ICD-10-CM | POA: Diagnosis not present

## 2017-08-27 DIAGNOSIS — Z713 Dietary counseling and surveillance: Secondary | ICD-10-CM | POA: Diagnosis not present

## 2017-08-27 DIAGNOSIS — J329 Chronic sinusitis, unspecified: Secondary | ICD-10-CM | POA: Diagnosis not present

## 2017-08-27 DIAGNOSIS — E663 Overweight: Secondary | ICD-10-CM | POA: Diagnosis not present

## 2017-08-27 DIAGNOSIS — Z299 Encounter for prophylactic measures, unspecified: Secondary | ICD-10-CM | POA: Diagnosis not present

## 2017-08-27 DIAGNOSIS — I10 Essential (primary) hypertension: Secondary | ICD-10-CM | POA: Diagnosis not present

## 2017-08-27 DIAGNOSIS — Z789 Other specified health status: Secondary | ICD-10-CM | POA: Diagnosis not present

## 2017-10-07 ENCOUNTER — Telehealth (INDEPENDENT_AMBULATORY_CARE_PROVIDER_SITE_OTHER): Payer: Self-pay | Admitting: Physical Medicine and Rehabilitation

## 2017-10-07 NOTE — Telephone Encounter (Signed)
If no groin pain then ok for OV plus possible bursa

## 2017-10-07 NOTE — Telephone Encounter (Signed)
Scheduled for 10/23/17 at 0915.

## 2017-10-15 ENCOUNTER — Encounter: Payer: Self-pay | Admitting: Cardiovascular Disease

## 2017-10-15 ENCOUNTER — Other Ambulatory Visit: Payer: Self-pay

## 2017-10-15 ENCOUNTER — Ambulatory Visit (INDEPENDENT_AMBULATORY_CARE_PROVIDER_SITE_OTHER): Payer: Medicare Other | Admitting: Cardiovascular Disease

## 2017-10-15 ENCOUNTER — Telehealth: Payer: Self-pay | Admitting: Cardiovascular Disease

## 2017-10-15 VITALS — BP 117/80 | HR 121 | Ht 67.0 in | Wt 159.0 lb

## 2017-10-15 DIAGNOSIS — I10 Essential (primary) hypertension: Secondary | ICD-10-CM

## 2017-10-15 DIAGNOSIS — Z7189 Other specified counseling: Secondary | ICD-10-CM

## 2017-10-15 DIAGNOSIS — I4891 Unspecified atrial fibrillation: Secondary | ICD-10-CM

## 2017-10-15 DIAGNOSIS — I6529 Occlusion and stenosis of unspecified carotid artery: Secondary | ICD-10-CM | POA: Diagnosis not present

## 2017-10-15 MED ORDER — APIXABAN 5 MG PO TABS: 5.0000 mg | ORAL_TABLET | Freq: Two times a day (BID) | ORAL | 0 refills | Status: DC

## 2017-10-15 MED ORDER — APIXABAN 5 MG PO TABS: 5.0000 mg | ORAL_TABLET | Freq: Two times a day (BID) | ORAL | 3 refills | Status: DC

## 2017-10-15 MED ORDER — CARVEDILOL 25 MG PO TABS: 37.5000 mg | ORAL_TABLET | Freq: Two times a day (BID) | ORAL | 1 refills | Status: DC

## 2017-10-15 NOTE — Progress Notes (Signed)
SUBJECTIVE: The patient presents for routine follow-up.  She underwent left carotid endarterectomy on 03/27/17. She also has a history of hypertension.  She denies chest pain and shortness of breath.  She has occasional palpitations primarily when lying down at night.  About 2 to 3 weeks ago she noted irregularities with her blood pressure cuff when attempting to monitor her heart rate.  She thought something was wrong with her cuff and purchased a new one.  ECG performed in our office today which I personally reviewed demonstrates rapid atrial fibrillation, 102 bpm.  There is a nonspecific intraventricular conduction delay and late R wave transition.  BUN 23, creatinine 0.98 on 08/18/2017.  Hemoglobin 10.8 on 03/28/2017.  Family history: Her father, 2 brothers, and 2 sisters have protein S deficiency.   Review of Systems: As per "subjective", otherwise negative.  Allergies  Allergen Reactions  . Rosuvastatin Other (See Comments)    MYALGIA's Muscle pain  . Other     PAIN MEDICATIONS/ANESTHESIA  MADE BP DROP LOW     Current Outpatient Medications  Medication Sig Dispense Refill  . amLODipine (NORVASC) 10 MG tablet Take 10 mg by mouth daily at 12 noon.     Marland Kitchen aspirin EC 81 MG tablet Take 81 mg by mouth daily.    . butalbital-acetaminophen-caffeine (FIORICET, ESGIC) 50-325-40 MG tablet Take 1 tablet by mouth every 6 (six) hours as needed for headache or migraine.     . calcium-vitamin D (OSCAL WITH D) 500-200 MG-UNIT tablet Take 1 tablet by mouth daily.    . carvedilol (COREG) 25 MG tablet Take 25 mg by mouth 2 (two) times daily with a meal.     . chlorthalidone (HYGROTON) 25 MG tablet Take 1 tablet (25 mg total) by mouth daily. 30 tablet 6  . Cyanocobalamin (VITAMIN B 12 PO) Take 1 tablet by mouth daily.     Marland Kitchen estradiol (ESTRACE) 0.5 MG tablet Take 0.5 mg by mouth daily.     . fluticasone (FLONASE) 50 MCG/ACT nasal spray Place 1 spray into both nostrils daily.    . Lidocaine  HCl (ASPERCREME LIDOCAINE) 4 % LIQD Apply 1 application topically as needed (for pain).    Marland Kitchen losartan (COZAAR) 100 MG tablet Take 100 mg by mouth daily at 12 noon.     . magnesium hydroxide (MILK OF MAGNESIA) 400 MG/5ML suspension Take 30 mLs by mouth daily as needed for mild constipation.    . Multiple Vitamins-Minerals (MULTIVITAMIN WITH MINERALS) tablet Take 1 tablet by mouth daily.    . naproxen sodium (ANAPROX) 220 MG tablet Take 550 mg by mouth daily with breakfast. Takes 2.5 tablets    . Omega-3 Fatty Acids (FISH OIL) 1000 MG CAPS Take 1,000 mg by mouth daily.     . pantoprazole (PROTONIX) 40 MG tablet Take 40 mg by mouth daily before breakfast.      No current facility-administered medications for this visit.     Past Medical History:  Diagnosis Date  . Arthritis    oa, all over - multiple areas   . GERD (gastroesophageal reflux disease)   . Headache    h/o migraines   . Hypertension     Past Surgical History:  Procedure Laterality Date  . ABDOMINAL HYSTERECTOMY    . APPENDECTOMY    . BACK SURGERY    . ENDARTERECTOMY Left 03/27/2017   Procedure: ENDARTERECTOMY CAROTID LEFT;  Surgeon: [name], MD;  Location: [facility];  Service: Vascular;  Laterality:  Left;  . INNER EAR SURGERY Right    puntured eardrum- repaired 2x's   . NASAL SINUS SURGERY     deviated septum  . PATCH ANGIOPLASTY Left 03/27/2017   Procedure: PATCH ANGIOPLASTY LEFT CAROTID ARTERY USING Rueben Bash BIOLOGIC PATCH;  Surgeon: [name], MD;  Location: [facility];  Service: Vascular;  Laterality: Left;  . TONSILLECTOMY    . TOTAL KNEE ARTHROPLASTY Right 10/23/2016   Procedure: RIGHT TOTAL KNEE ARTHROPLASTY;  Surgeon: [name], MD;  Location: [facility];  Service: Orthopedics;  Laterality: Right;    Social History   Socioeconomic History  . Marital status: Single    Spouse name: Not on file  . Number of children: Not on file  . Years of education: Not on file  . Highest education  level: Not on file  Occupational History  . Not on file  Social Needs  . Financial resource strain: Not on file  . Food insecurity:    Worry: Not on file    Inability: Not on file  . Transportation needs:    Medical: Not on file    Non-medical: Not on file  Tobacco Use  . Smoking status: Never Smoker  . Smokeless tobacco: Never Used  Substance and Sexual Activity  . Alcohol use: No  . Drug use: No  . Sexual activity: Not on file  Lifestyle  . Physical activity:    Days per week: Not on file    Minutes per session: Not on file  . Stress: Not on file  Relationships  . Social connections:    Talks on phone: Not on file    Gets together: Not on file    Attends religious service: Not on file    Active member of club or organization: Not on file    Attends meetings of clubs or organizations: Not on file    Relationship status: Not on file  . Intimate partner violence:    Fear of current or ex partner: Not on file    Emotionally abused: Not on file    Physically abused: Not on file    Forced sexual activity: Not on file  Other Topics Concern  . Not on file  Social History Narrative  . Not on file     Vitals:   10/15/17 1114  BP: 117/80  Pulse: (!) 121  SpO2: 96%  Weight: 159 lb (72.1 kg)  Height: 5\' 7"  (1.702 m)    Wt Readings from Last 3 Encounters:  10/15/17 159 lb (72.1 kg)  08/14/17 152 lb (68.9 kg)  04/16/17 150 lb (68 kg)     PHYSICAL EXAM General: NAD HEENT: Normal. Neck: No JVD, no thyromegaly. Lungs: Clear to auscultation bilaterally with normal respiratory effort. CV: Tachycardic, irregular rhythm, normal S1/S2, no S3, no murmur. No pretibial or periankle edema.     Abdomen: Soft, nontender, no distention.  Neurologic: Alert and oriented.  Psych: Normal affect. Skin: Normal. Musculoskeletal: No gross deformities.    ECG: Most recent ECG reviewed.   Labs: Lab Results  Component Value Date/Time   K 3.9 03/28/2017 03:15 AM   BUN 18  03/28/2017 03:15 AM   CREATININE 1.11 (H) 03/28/2017 03:15 AM   ALT 13 (L) 03/25/2017 02:18 PM   HGB 10.8 (L) 03/28/2017 03:15 AM     Lipids: No results found for: LDLCALC, LDLDIRECT, CHOL, TRIG, HDL     ASSESSMENT AND PLAN: 1.  New onset rapid atrial fibrillation: She is relatively asymptomatic.  I will  increase carvedilol to 37.5 mg twice daily for more adequate heart rate control. CHADSVASC score is 5 thus systemic anticoagulation is indicated to reduce thromboembolic risk.  I will stop aspirin and start Eliquis 5 mg twice daily.  I will check a CBC, basic metabolic panel, and TSH.  I will obtain an echocardiogram to evaluate cardiac structure and function.  2.  High-grade left internal carotid artery stenosis status post carotid endarterectomy in October 2018: Stable.    I am stopping aspirin as I am starting Eliquis.  3.  Accelerated hypertension: Blood pressure is normal today.  I started chlorthalidone 25 mg daily at her last visit.  She is also taking amlodipine 10 mg, carvedilol 25 mg twice daily, and losartan 100 mg.    As I am increasing the dose of carvedilol to control heart rates in the context of atrial fibrillation, I will continue to monitor her blood pressure.    Disposition: Follow up 6-8 weeks.  Time spent: 40 minutes, of which greater than 50% was spent reviewing symptoms, relevant blood tests and studies, and discussing management plan with the patient.    Kate Sable, M.D., F.A.C.C.

## 2017-10-15 NOTE — Patient Instructions (Signed)
Your physician recommends that you schedule a follow-up appointment in: [place] physician has recommended you make the following change in your medication:   STOP ASPIRIN   START ELIQUIS 5 MG TWICE DAILY  INCREASE CARVEDILOL 37.5 MG ( 1 AND 1/2 TABLETS) TWICE DAILY  Your physician recommends that you return for lab work CBC/BMP - ORDERS GIVEN TO [place]   Your physician has requested that you have an echocardiogram. Echocardiography is a painless test that uses sound waves to create images of your heart. It provides your doctor with information about the size and shape of your heart and how well your heart's chambers and valves are working. This procedure takes approximately one hour. There are no restrictions for this procedure.  Thank you for choosing [facility]!!

## 2017-10-15 NOTE — Telephone Encounter (Signed)
Echo at [facility]  Nov 06, 2017

## 2017-10-17 ENCOUNTER — Ambulatory Visit (INDEPENDENT_AMBULATORY_CARE_PROVIDER_SITE_OTHER): Payer: Medicare Other | Admitting: Family

## 2017-10-17 ENCOUNTER — Encounter: Payer: Self-pay | Admitting: Family

## 2017-10-17 ENCOUNTER — Ambulatory Visit (HOSPITAL_COMMUNITY)
Admission: RE | Admit: 2017-10-17 | Discharge: 2017-10-17 | Disposition: A | Discharge status: Home / Self Care | Payer: Medicare Other | Source: Ambulatory Visit | Attending: Family | Admitting: Family

## 2017-10-17 VITALS — BP 106/67 | HR 73 | Temp 97.1°F | Resp 20 | Ht 67.0 in | Wt 156.7 lb

## 2017-10-17 DIAGNOSIS — I6522 Occlusion and stenosis of left carotid artery: Secondary | ICD-10-CM

## 2017-10-17 DIAGNOSIS — I6523 Occlusion and stenosis of bilateral carotid arteries: Secondary | ICD-10-CM | POA: Diagnosis not present

## 2017-10-17 DIAGNOSIS — Z9889 Other specified postprocedural states: Secondary | ICD-10-CM | POA: Diagnosis not present

## 2017-10-17 DIAGNOSIS — N951 Menopausal and female climacteric states: Secondary | ICD-10-CM | POA: Insufficient documentation

## 2017-10-17 NOTE — Patient Instructions (Signed)

## 2017-10-17 NOTE — Progress Notes (Signed)
Chief Complaint: Follow up Extracranial Carotid Artery Stenosis   History of Present Illness  Lydia Graham is a 82 y.o. female who is status post left carotid endarterectomy on 03-27-17 by Dr. Donzetta Matters for asymptomatic very high-grade stenosis.    She has no neurologic symptoms.    She denies any known history of stroke or TIA. Specifically she denies a history of amaurosis fugax or monocular blindness, unilateral facial drooping, hemiplegia, or receptive or expressive aphasia.     Diabetic: no Tobacco use: non-smoker  Pt meds include: Statin : no, is statin intolerant, caused myalgias  ASA: no Other anticoagulants/antiplatelets: Eliquis for atrial fib, but is making her tired   Past Medical History:  Diagnosis Date  . Arthritis    oa, all over - multiple areas   . GERD (gastroesophageal reflux disease)   . Headache    h/o migraines   . Hypertension     Social History Social History   Tobacco Use  . Smoking status: Never Smoker  . Smokeless tobacco: Never Used  Substance Use Topics  . Alcohol use: No  . Drug use: No    Family History Family History  Problem Relation Age of Onset  . Stroke Mother     Surgical History Past Surgical History:  Procedure Laterality Date  . ABDOMINAL HYSTERECTOMY    . APPENDECTOMY    . BACK SURGERY    . ENDARTERECTOMY Left 03/27/2017   Procedure: ENDARTERECTOMY CAROTID LEFT;  Surgeon: [name], MD;  Location: [facility];  Service: Vascular;  Laterality: Left;  . INNER EAR SURGERY Right    puntured eardrum- repaired 2x's   . NASAL SINUS SURGERY     deviated septum  . PATCH ANGIOPLASTY Left 03/27/2017   Procedure: PATCH ANGIOPLASTY LEFT CAROTID ARTERY USING Rueben Bash BIOLOGIC PATCH;  Surgeon: [name], MD;  Location: [facility];  Service: Vascular;  Laterality: Left;  . TONSILLECTOMY    . TOTAL KNEE ARTHROPLASTY Right 10/23/2016   Procedure: RIGHT TOTAL KNEE ARTHROPLASTY;  Surgeon: [name], MD;   Location: [facility];  Service: Orthopedics;  Laterality: Right;    Allergies  Allergen Reactions  . Rosuvastatin Other (See Comments)    MYALGIA's Muscle pain  . Other     PAIN MEDICATIONS/ANESTHESIA  MADE BP DROP LOW     Current Outpatient Medications  Medication Sig Dispense Refill  . amLODipine (NORVASC) 10 MG tablet Take 10 mg by mouth daily at 12 noon.     Marland Kitchen apixaban (ELIQUIS) 5 MG TABS tablet Take 1 tablet (5 mg total) by mouth 2 (two) times daily. 56 tablet 0  . butalbital-acetaminophen-caffeine (FIORICET, ESGIC) 50-325-40 MG tablet Take 1 tablet by mouth every 6 (six) hours as needed for headache or migraine.     . calcium-vitamin D (OSCAL WITH D) 500-200 MG-UNIT tablet Take 1 tablet by mouth daily.    . carvedilol (COREG) 25 MG tablet Take 1.5 tablets (37.5 mg total) by mouth 2 (two) times daily with a meal. 270 tablet 1  . chlorthalidone (HYGROTON) 25 MG tablet Take 1 tablet (25 mg total) by mouth daily. 30 tablet 6  . Cyanocobalamin (VITAMIN B 12 PO) Take 1 tablet by mouth daily.     Marland Kitchen estradiol (ESTRACE) 0.5 MG tablet Take 0.5 mg by mouth daily.     . fluticasone (FLONASE) 50 MCG/ACT nasal spray Place 1 spray into both nostrils daily.    . Lidocaine HCl (ASPERCREME LIDOCAINE) 4 % LIQD Apply 1 application topically  as needed (for pain).    Marland Kitchen losartan (COZAAR) 100 MG tablet Take 100 mg by mouth daily at 12 noon.     . magnesium hydroxide (MILK OF MAGNESIA) 400 MG/5ML suspension Take 30 mLs by mouth daily as needed for mild constipation.    . Multiple Vitamins-Minerals (MULTIVITAMIN WITH MINERALS) tablet Take 1 tablet by mouth daily.    . naproxen sodium (ANAPROX) 220 MG tablet Take 550 mg by mouth daily with breakfast. Takes 2.5 tablets    . Omega-3 Fatty Acids (FISH OIL) 1000 MG CAPS Take 1,000 mg by mouth daily.     . pantoprazole (PROTONIX) 40 MG tablet Take 40 mg by mouth daily before breakfast.      No current facility-administered medications for this visit.     Review  of Systems : See HPI for pertinent positives and negatives.  Physical Examination  Vitals:   10/17/17 1357 10/17/17 1403  BP: 113/67 106/67  Pulse: 73   Resp: 20   Temp: (!) 97.1 F (36.2 C)   TempSrc: Oral   SpO2: 96%   Weight: 156 lb 11.2 oz (71.1 kg)   Height: 5\' 7"  (1.702 m)    Body mass index is 24.54 kg/m.  General: WDWN female in NAD GAIT: antalgic, pain in hip Eyes: PERRLA HENT: No gross abnormalities.  Pulmonary:  Respirations are non-labored, good air movement, CTAB, no rales, rhonchi, or wheezing. Cardiac: Irregular rhythm with controlled rate no detected murmur.  VASCULAR EXAM Carotid Bruits Right Left   Negative Negative     Abdominal aortic pulse is not palpable. Radial pulses are 2+ palpable and equal.                                                                                                                            LE Pulses Right Left       POPLITEAL  not palpable   not palpable       POSTERIOR TIBIAL  not palpable   not palpable        DORSALIS PEDIS      ANTERIOR TIBIAL faintly palpable  faintly palpable     Gastrointestinal: soft, nontender, BS WNL, no r/g, no palpable masses. Musculoskeletal: No muscle atrophy/wasting. M/S 5/5 throughout, extremities without ischemic changes. Skin: No rashes, no ulcers, no cellulitis. Pt is wearing knee high compression hose.    Neurologic:  A&O X 3; appropriate affect, sensation is normal; speech is normal, CN 2-12 intact, pain and light touch intact in extremities, motor exam as listed above. Psychiatric: Normal thought content, mood appropriate to clinical situation.    Assessment: Lydia Graham is a 81 y.o. female who  is status post left carotid endarterectomy on 03-27-17.  She has no history of stroke or TIA.   Fortunately she has never used tobacco and does not have DM. She is statin intolerant.  She takes Eliquis for atrial fib, stroke prophylaxis.   DATA Carotid Duplex  (10/17/17): Right  ICA: 1-39% stenosis Left ICA: CEA site, with 1-39% stenosis Bilateral vertebral artery flow is antegrade.  Bilateral subclavian artery waveforms are normal.  Improvement in the stenosis of the left ICA compared to the preoperative exam on 03-21-17.    Plan: Follow-up in 6 months with Carotid Duplex scan.   I discussed in depth with the patient the nature of atherosclerosis, and emphasized the importance of maximal medical management including strict control of blood pressure, blood glucose, and lipid levels, obtaining regular exercise, and continued cessation of smoking.  The patient is aware that without maximal medical management the underlying atherosclerotic disease process will progress, limiting the benefit of any interventions. The patient was given information about stroke prevention and what symptoms should prompt the patient to seek immediate medical care. Thank you for allowing [place] to participate in this patient's care.  Clemon Chambers, RN, MSN, FNP-C [facility] Office: [phone]  Clinic Physician: Donzetta Matters  10/17/17 5:07 PM

## 2017-10-20 DIAGNOSIS — M545 Low back pain: Secondary | ICD-10-CM | POA: Diagnosis not present

## 2017-10-20 DIAGNOSIS — S0990XA Unspecified injury of head, initial encounter: Secondary | ICD-10-CM | POA: Diagnosis not present

## 2017-10-20 DIAGNOSIS — Z6826 Body mass index (BMI) 26.0-26.9, adult: Secondary | ICD-10-CM | POA: Diagnosis not present

## 2017-10-20 DIAGNOSIS — I10 Essential (primary) hypertension: Secondary | ICD-10-CM | POA: Diagnosis not present

## 2017-10-20 DIAGNOSIS — Z299 Encounter for prophylactic measures, unspecified: Secondary | ICD-10-CM | POA: Diagnosis not present

## 2017-10-23 ENCOUNTER — Telehealth: Payer: Self-pay

## 2017-10-23 ENCOUNTER — Encounter (INDEPENDENT_AMBULATORY_CARE_PROVIDER_SITE_OTHER): Payer: Self-pay | Admitting: Physical Medicine and Rehabilitation

## 2017-10-23 ENCOUNTER — Telehealth: Payer: Self-pay | Admitting: *Deleted

## 2017-10-23 ENCOUNTER — Ambulatory Visit (INDEPENDENT_AMBULATORY_CARE_PROVIDER_SITE_OTHER): Payer: Medicare Other | Admitting: Physical Medicine and Rehabilitation

## 2017-10-23 ENCOUNTER — Ambulatory Visit (INDEPENDENT_AMBULATORY_CARE_PROVIDER_SITE_OTHER): Payer: Medicare Other

## 2017-10-23 VITALS — BP 137/78 | HR 80

## 2017-10-23 DIAGNOSIS — I6522 Occlusion and stenosis of left carotid artery: Secondary | ICD-10-CM | POA: Diagnosis not present

## 2017-10-23 DIAGNOSIS — M5441 Lumbago with sciatica, right side: Secondary | ICD-10-CM | POA: Diagnosis not present

## 2017-10-23 DIAGNOSIS — M25551 Pain in right hip: Secondary | ICD-10-CM

## 2017-10-23 DIAGNOSIS — G8929 Other chronic pain: Secondary | ICD-10-CM

## 2017-10-23 DIAGNOSIS — M7061 Trochanteric bursitis, right hip: Secondary | ICD-10-CM | POA: Diagnosis not present

## 2017-10-23 DIAGNOSIS — Z7901 Long term (current) use of anticoagulants: Secondary | ICD-10-CM

## 2017-10-23 DIAGNOSIS — I4891 Unspecified atrial fibrillation: Secondary | ICD-10-CM | POA: Diagnosis not present

## 2017-10-23 DIAGNOSIS — I10 Essential (primary) hypertension: Secondary | ICD-10-CM

## 2017-10-23 MED ORDER — BUPIVACAINE HCL 0.25 % IJ SOLN
4.0000 mL | INTRAMUSCULAR | Status: AC | PRN
Start: 2017-10-23 — End: 2017-10-23
  Administered 2017-10-23: 4 mL via INTRA_ARTICULAR

## 2017-10-23 MED ORDER — TRIAMCINOLONE ACETONIDE 40 MG/ML IJ SUSP
80.0000 mg | INTRAMUSCULAR | Status: AC | PRN
  Administered 2017-10-23: 80 mg via INTRA_ARTICULAR

## 2017-10-23 MED ORDER — LIDOCAINE HCL 2 % IJ SOLN
4.0000 mL | INTRAMUSCULAR | Status: AC | PRN
Start: 2017-10-23 — End: 2017-10-23
  Administered 2017-10-23: 4 mL

## 2017-10-23 NOTE — Telephone Encounter (Signed)
Pt aware and voiced understanding - will mail lab orders - routed to pcp

## 2017-10-23 NOTE — Progress Notes (Signed)
.  Numeric Pain Rating Scale and Functional Assessment Average Pain 4 Pain Right Now 3 My pain is constant, dull and aching Pain is worse with: walking and some activites Pain improves with: heat/ice   In the last MONTH (on 0-10 scale) has pain interfered with the following?  1. General activity like being  able to carry out your everyday physical activities such as walking, climbing stairs, carrying groceries, or moving a chair?  Rating(5)  2. Relation with others like being able to carry out your usual social activities and roles such as  activities at home, at work and in your community. Rating(5)  3. Enjoyment of life such that you have  been bothered by emotional problems such as feeling anxious, depressed or irritable?  Rating(0)

## 2017-10-23 NOTE — Progress Notes (Signed)
Lydia Graham - 82 y.o. female MRN [id number]  Date of birth: Nov 10, 1935  Office Visit Note: Visit Date: 10/23/2017 PCP: Glenda Chroman, MD Referred by: Glenda Chroman, MD  Subjective: Chief Complaint  Patient presents with  . Right Hip - Pain  . Lower Back - Pain   HPI: Lydia Graham is a pleasant 82 year old female that I have seen off and on over the last several years for chronic worsening low back and hip pain and sometimes greater trochanteric pain syndrome.  Her orthopedic care is typically provided by Dr. Sharol Given in our office.  She comes in today with complaints of right lateral hip and thigh pain.  She reports this started several months ago and is just gotten worse.  She reports pain with ambulation and laying on the right side.  She reports worsening pain with activity.  She reports this is a constant dull and aching pain.  She is using ice with some relief but medications are not helpful.  She is status post total knee arthroplasty on the right.  We have completed left-sided greater trochanteric bursa in the past with good relief of similar symptoms.  She also complains of chronic back pain with a history of scoliosis and spondylosis without much in the way of central canal stenosis.  Last MRI was from 2015.  She has had no new trauma or falls.  She also reports a lot of tiredness with walking.  She does ambulate with a walking stick.  She said no focal weakness or foot drop.  Since I have last seen her she is been diagnosed with atrial fibrillation and is status post left carotid endarterectomy.  She was placed on Eliquis.  She reports to me that she has been self weaning off of the Eliquis because she thought it was making her tired and decreasing her walking endurance.  She has not talked to her cardiologist about removing the medication.   Review of Systems  Constitutional: Negative for chills, fever, malaise/fatigue and weight loss.  HENT: Negative for hearing loss and sinus pain.    Eyes: Negative for blurred vision, double vision and photophobia.  Respiratory: Negative for cough and shortness of breath.   Cardiovascular: Negative for chest pain, palpitations and leg swelling.  Gastrointestinal: Negative for abdominal pain, nausea and vomiting.  Genitourinary: Negative for flank pain.  Musculoskeletal: Positive for back pain and joint pain. Negative for myalgias.  Skin: Negative for itching and rash.  Neurological: Negative for tremors, focal weakness and weakness.  Endo/Heme/Allergies: Negative.   Psychiatric/Behavioral: Negative for depression.  All other systems reviewed and are negative.  Otherwise per HPI.  Assessment & Plan: Visit Diagnoses:  1. Greater trochanteric bursitis, right   2. Pain in right hip   3. Chronic bilateral low back pain with right-sided sciatica   4. Atrial fibrillation, unspecified type (Lydia Graham)   5. On continuous oral anticoagulation     Plan: Findings:  1.  Several month history of right hip and thigh pain consistent with greater trochanteric pain syndrome or bursitis.  She has had similar findings on the left in the past.  Exam is consistent with pain exquisitely over the right greater trochanter that does reproduce a lot of her symptoms.  She is nontender over the left.  She has no pain with hip rotation or groin pain.  We will provide right greater trochanteric injection today with fluoroscopic guidance to the body habitus.  2.  Chronic history of low back pain which  is fairly stable at this point.  He has had physical therapy in the past and continues to try to walk.  She does ambulate with an antalgic gait.  She is a prior knee arthroplasty.  She has history of scoliosis and spondylosis.  Probably should look at facet joint block and radiofrequency ablation at some point if her back pain would worsen.  She is still fairly active at 82 years old.  No change in medication.  3.  New onset atrial fibrillation diagnosed recently and placed  on Eliquis.  She reports self weaning of the Eliquis because she thought it was making her tired and not able to walk as far.  She did this without talking to the cardiologist.  I have cautioned her and convinced her hopefully to give them a call concerning the symptoms that she feels like were happening with the Eliquis.  I do not want her to stop Eliquis I think she should take it as directed until she talks with them.  We talked about the risk of stroke, statistical improbability standpoint with atrial fibrillation.  She has not called them.  She is set up for echocardiogram the next week or so.  She is going to call them today.    Meds & Orders: No orders of the defined types were placed in this encounter.   Orders Placed This Encounter  Procedures  . Large Joint Inj: R greater trochanter  . XR C-ARM NO REPORT    Follow-up: Return if symptoms worsen or fail to improve.   Procedures: Large Joint Inj: R greater trochanter on 10/23/2017 9:23 AM Indications: pain and diagnostic evaluation Details: 22 G 3.5 in needle, fluoroscopy-guided lateral approach  Arthrogram: No  Medications: 4 mL bupivacaine 0.25 %; 4 mL lidocaine 2 %; 80 mg triamcinolone acetonide 40 MG/ML Outcome: tolerated well, no immediate complications  There was excellent flow of contrast outlined the greater trochanteric bursa without vascular uptake. Procedure, treatment alternatives, risks and benefits explained, specific risks discussed. Consent was given by the patient. Immediately prior to procedure a time out was called to verify the correct patient, procedure, equipment, support staff and site/side marked as required. Patient was prepped and draped in the usual sterile fashion.      No notes on file   Clinical History: FINDINGS: There is 13 degrees of levoconvex lumbar scoliosis between L5 and L2. Lipoma of the a left lateral abdominal wall musculature incidentally noted.  The lowest lumbar type non-rib-bearing  vertebra is labeled as L5. The conus medullaris appears normal. Conus level: L1-2. Suspected postoperative findings on the right at L3-4.  There is disc desiccation throughout the lumbar spine. Type 1 degenerative endplate findings at H9-6 with type 2 degenerative endplate findings at Q2-2.  There is 3 mm of grade 1 anterolisthesis at L3-4 and 3 mm retrolisthesis at L4-5.  The leftward scoliosis has a significant rotary component.  Small central disc protrusion at T12-L1 without impingement. Additional findings at individual levels are as follows:  L1-2: No impingement. Left lateral recess and inferior foraminal disc protrusion.  L2-3: Mild displacement of the left L2 nerve in the lateral extraforaminal space due to left lateral extraforaminal disc protrusion.  L3-4: Moderate right foraminal stenosis and mild right subarticular lateral recess stenosis due to disc uncovering, right lateral recess and foraminal disc protrusion, and facet arthropathy. There is also a left paracentral disc protrusion.  L4-5: Mild left foraminal stenosis along with mild left and borderline right subarticular lateral recess stenosis due  to disc bulge and facet and intervertebral spurring.  L5-S1: No impingement. Small bilateral synovial cysts are best observed on the axial images.  IMPRESSION: 1. Lumbar spondylosis and degenerative disc disease, causing moderate impingement at L3-4 and mild impingement at L2-3 and L4-5, as detailed above. 2. Levoconvex lumbar scoliosis with rotary component.   Electronically Signed   By: Sherryl Barters M.D.   On: 11/02/2013 11:29   She reports that she has never smoked. She has never used smokeless tobacco. No results for input(s): HGBA1C, LABURIC in the last 8760 hours.  Objective:  VS:  HT:    WT:   BMI:     BP:137/78  HR:80bpm  TEMP: ( )  RESP:100 % Physical Exam  Constitutional: She is oriented to person, place, and time. She  appears well-developed and well-nourished. No distress.  HENT:  Head: Normocephalic and atraumatic.  Nose: Nose normal.  Mouth/Throat: Oropharynx is clear and moist.  Eyes: Pupils are equal, round, and reactive to light. Conjunctivae are normal.  Neck: Normal range of motion. Neck supple.  Cardiovascular: Regular rhythm and intact distal pulses.  Pulmonary/Chest: Effort normal. No respiratory distress.  Abdominal: She exhibits no distension. There is no guarding.  Musculoskeletal:  Patient is somewhat slow to rise from a seated position and does have pain with extension of the lumbar spine.  She has exquisite pain to palpation of the right greater trochanter.  She has no real tenderness over the left greater trochanter.  She has no pain with hip rotation bilaterally.  She has good distal strength without clonus.  Neurological: She is alert and oriented to person, place, and time. She exhibits normal muscle tone. Coordination normal.  Skin: Skin is warm. No rash noted. No erythema.  Psychiatric: She has a normal mood and affect. Her behavior is normal.  Nursing note and vitals reviewed.   Ortho Exam Imaging: Xr C-arm No Report  Result Date: 10/23/2017 Please see Notes or Procedures tab for imaging impression.   Past Medical/Family/Surgical/Social History: Medications & Allergies reviewed per EMR, new medications updated. Patient Active Problem List   Diagnosis Date Noted  . Menopausal symptom 10/17/2017  . Menopausal syndrome 10/17/2017  . Carotid stenosis 03/27/2017  . S/P total knee arthroplasty, right 10/23/2016  . Unilateral primary osteoarthritis, right knee 07/13/2016   Past Medical History:  Diagnosis Date  . Arthritis    oa, all over - multiple areas   . GERD (gastroesophageal reflux disease)   . Headache    h/o migraines   . Hypertension    Family History  Problem Relation Age of Onset  . Stroke Mother    Past Surgical History:  Procedure Laterality Date  .  ABDOMINAL HYSTERECTOMY    . APPENDECTOMY    . BACK SURGERY    . ENDARTERECTOMY Left 03/27/2017   Procedure: ENDARTERECTOMY CAROTID LEFT;  Surgeon: [name], MD;  Location: [facility];  Service: Vascular;  Laterality: Left;  . INNER EAR SURGERY Right    puntured eardrum- repaired 2x's   . NASAL SINUS SURGERY     deviated septum  . PATCH ANGIOPLASTY Left 03/27/2017   Procedure: PATCH ANGIOPLASTY LEFT CAROTID ARTERY USING Rueben Bash BIOLOGIC PATCH;  Surgeon: [name], MD;  Location: [facility];  Service: Vascular;  Laterality: Left;  . TONSILLECTOMY    . TOTAL KNEE ARTHROPLASTY Right 10/23/2016   Procedure: RIGHT TOTAL KNEE ARTHROPLASTY;  Surgeon: [name], MD;  Location: [facility];  Service: Orthopedics;  Laterality: Right;  Social History   Occupational History  . Not on file  Tobacco Use  . Smoking status: Never Smoker  . Smokeless tobacco: Never Used  Substance and Sexual Activity  . Alcohol use: No  . Drug use: No  . Sexual activity: Not on file

## 2017-10-23 NOTE — Patient Instructions (Signed)

## 2017-10-23 NOTE — Telephone Encounter (Signed)
Taking Eliquis once daily or ASA alone will not prevent a stroke in the context of atrial fibrillation. If she truly isn't tolerating Eliquis, she could try Xarelto 20 mg daily.

## 2017-10-23 NOTE — Telephone Encounter (Signed)
-----   Message from Herminio Commons, MD sent at 10/23/2017 10:21 AM EDT ----- Sodium mildly low. Reduce chlorthalidone to 12.5 mg daily and repeat BMET in 2 weeks.

## 2017-10-23 NOTE — Telephone Encounter (Signed)
Pt voiced understanding and agreeable to continue Eliquis for now and monitor symptoms

## 2017-10-23 NOTE — Telephone Encounter (Signed)
Patient stated when taking eliquis 5 mg BID she was very lightheaded, felt like she was going to pass out and could not function. Patient states she decided herself to cut back to once a day. Patient states she seen her PCP and he advised her that was not a good idea and she needed to consult with cardiologist before making that adjustment. Patient states she has continued with the once a day and her plan is to go back to just taking asa. Will forward to provider for further recommendation

## 2017-10-30 ENCOUNTER — Telehealth: Payer: Self-pay | Admitting: *Deleted

## 2017-10-30 ENCOUNTER — Telehealth: Payer: Self-pay | Admitting: Cardiovascular Disease

## 2017-10-30 DIAGNOSIS — I10 Essential (primary) hypertension: Secondary | ICD-10-CM | POA: Diagnosis not present

## 2017-10-30 NOTE — Telephone Encounter (Signed)
Pt aware and voiced understanding - will leave lab orders up front for pickup as requested. Updated medication list.

## 2017-10-30 NOTE — Telephone Encounter (Signed)
Have her monitor her heart rate. I increased carvedilol at her last visit. I will await echo results and then determine additional medical therapy if needed.

## 2017-10-30 NOTE — Telephone Encounter (Signed)
Pt says when she was walking this morning (1 mile morning walks) says this morning she couldn't finish she got so short winded and chest felt tight - says this is the 1st time this has happened - denies any chest pain/dizziness/swelling - says she feels like she can't get enough oxygen while walking - explained that would forward to Dr Bronson Ing but may want to also check with pcp regarding her request for an inhaler. Pt has upcoming echo on 5/16

## 2017-10-30 NOTE — Telephone Encounter (Signed)
Patient walk in  Patient asking about her breathing.  She stated that she has been SOB and chest feeling tight.  Wanted to know if Dr Bronson Ing would handle or if she needs to contact her PCP

## 2017-10-30 NOTE — Telephone Encounter (Signed)
Pt aware and voiced understanding - will monitor HR

## 2017-10-30 NOTE — Telephone Encounter (Signed)
-----   Message from Herminio Commons, MD sent at 10/30/2017  2:28 PM EDT ----- Sodium is low. Stop chlorthalidone and repeat BMET in 2 weeks.

## 2017-11-03 ENCOUNTER — Telehealth: Payer: Self-pay | Admitting: Cardiovascular Disease

## 2017-11-03 DIAGNOSIS — R600 Localized edema: Secondary | ICD-10-CM

## 2017-11-03 NOTE — Telephone Encounter (Signed)
Daughter Arrie Aran) called stating that her mother continues to have swelling in her feet and ankles. Complaining of shortness of breath.  Please call daughter [phone]

## 2017-11-03 NOTE — Telephone Encounter (Signed)
Patient notified.  Stated that she went back on the Chlorthalidone 25mg  every morning.  Been doing x last 2 days.  Can not tell any difference & but maybe feeling a little better this evening.

## 2017-11-03 NOTE — Telephone Encounter (Addendum)
Daughter (Lydia Graham) calling - Swelling in feet & ankles - up into her calf.  No energy.  SOB with exertion - more noticeable lately.  Over the last month - getting worse.  Current weight is 162lbs.  Stated she did fall about 2 weeks ago - lost her footing and fell at the the [place] and hit her head.  She did go to her pmd 2 weeks ago & did not get scan scheduled till that Friday.  She did not go due to the lapse in time.  Stated she was feeling fine after that.  Did not go to the ED.

## 2017-11-03 NOTE — Telephone Encounter (Signed)
What are vitals? Is A fib still fast?

## 2017-11-03 NOTE — Telephone Encounter (Signed)
No answer

## 2017-11-03 NOTE — Telephone Encounter (Signed)
Thinks that she didn't start retaining fluid till Friday.  Having a lot of coughing this morning.  Patient stated fluid is just in her ankles.  Weight this morning was 160, normally weight about 150-152. Our scales did weigh her heavier though.    5/10 8:00 am - 137/83  82         1:00 pm -  120/74  124         2:00 pm - 104/69  99  5/12 120/70  70 5/13 139/83  92           120/89  97   Heart rate fluctuating.

## 2017-11-03 NOTE — Telephone Encounter (Signed)
Give Lasix 40 mg daily x 3 days and KCl 20 meq daily x 3 days. Then Lasix 40 mg with KCl 20 meq prn. Check BMET in 3 days.

## 2017-11-04 DIAGNOSIS — Z299 Encounter for prophylactic measures, unspecified: Secondary | ICD-10-CM | POA: Diagnosis not present

## 2017-11-04 DIAGNOSIS — J310 Chronic rhinitis: Secondary | ICD-10-CM | POA: Diagnosis not present

## 2017-11-04 DIAGNOSIS — Z789 Other specified health status: Secondary | ICD-10-CM | POA: Diagnosis not present

## 2017-11-04 DIAGNOSIS — I10 Essential (primary) hypertension: Secondary | ICD-10-CM | POA: Diagnosis not present

## 2017-11-04 DIAGNOSIS — Z6826 Body mass index (BMI) 26.0-26.9, adult: Secondary | ICD-10-CM | POA: Diagnosis not present

## 2017-11-04 MED ORDER — POTASSIUM CHLORIDE CRYS ER 20 MEQ PO TBCR: EXTENDED_RELEASE_TABLET | ORAL | 2 refills | Status: DC

## 2017-11-04 MED ORDER — FUROSEMIDE 40 MG PO TABS: ORAL_TABLET | ORAL | 2 refills | Status: DC

## 2017-11-04 NOTE — Telephone Encounter (Signed)
Hold chlorthalidone and then start Lasix and potassium regimen I described earlier. Then check BMET.

## 2017-11-04 NOTE — Telephone Encounter (Signed)
Did see Keavie at [facility] - stated irritation was all due to allergies.  Was given steroid shot & nasal spray.  Stated vitals were good there.  Weight today was 162 in her office, at home was 158lbs.

## 2017-11-04 NOTE — Telephone Encounter (Addendum)
Patient notified and verbalized understanding.  New medication sent to Lydia Graham.  She will do lab at [facility] on Monday, 11/10/2017.  Will fax order for her.  She will continue to log her BP & HR readings & call back on Monday with update.    HR still elevated. Switch Coreg to Toprol-XL 100 mg bid. Reduce losartan to 50 mg so as not to potentiate hypotension.

## 2017-11-04 NOTE — Telephone Encounter (Signed)
Calling back to check on reply - stated that she is going to her pmd today for some coughing & wheezing that she has been also having.

## 2017-11-06 ENCOUNTER — Ambulatory Visit (INDEPENDENT_AMBULATORY_CARE_PROVIDER_SITE_OTHER): Payer: Medicare Other

## 2017-11-06 ENCOUNTER — Telehealth: Payer: Self-pay | Admitting: *Deleted

## 2017-11-06 DIAGNOSIS — I4891 Unspecified atrial fibrillation: Secondary | ICD-10-CM

## 2017-11-06 MED ORDER — METOPROLOL SUCCINATE ER 100 MG PO TB24: 100.0000 mg | ORAL_TABLET | Freq: Two times a day (BID) | ORAL | 1 refills | Status: DC

## 2017-11-06 NOTE — Telephone Encounter (Signed)
-----   Message from Herminio Commons, MD sent at 11/06/2017  4:06 PM EDT ----- Pumping function on the low end of normal due to rapid heart rates.  There was moderate valve leakage.

## 2017-11-06 NOTE — Telephone Encounter (Signed)
Pt aware and voiced understanding - routed to pcp  

## 2017-11-06 NOTE — Addendum Note (Signed)
Addended by: Julian Hy T on: 11/06/2017 02:12 PM   Modules accepted: Orders

## 2017-11-06 NOTE — Telephone Encounter (Signed)
Pt aware and voiced understanding - updated medication list - Toprol XL sent to [facility].

## 2017-11-10 DIAGNOSIS — R600 Localized edema: Secondary | ICD-10-CM | POA: Diagnosis not present

## 2017-11-11 ENCOUNTER — Telehealth: Payer: Self-pay | Admitting: Cardiovascular Disease

## 2017-11-11 NOTE — Telephone Encounter (Signed)
Pt's apt w/ Dr. Raliegh Ip for this Friday had to be r/s--   wants to know if her meds will stay the same since he made some changes.   She's been r/s for 6/26 b/c she's going to be out of town till the middle of June.

## 2017-11-11 NOTE — Telephone Encounter (Signed)
Patient had questions regarding lab work. Lab results reviewed with patient.

## 2017-11-11 NOTE — Telephone Encounter (Signed)
Patient states she has not taken anymore lasix

## 2017-11-12 ENCOUNTER — Telehealth: Payer: Self-pay | Admitting: Cardiovascular Disease

## 2017-11-12 NOTE — Telephone Encounter (Signed)
Patient wanting to be seen or talk to someone about her concerns about her being tired and her heart function before she goes out of town November 23, 2017

## 2017-11-12 NOTE — Telephone Encounter (Signed)
Patient of Dr. Bronson Ing.  He is currently out of the office and will not be able to see the patient this week.  If her symptoms are deteriorating that quickly, it might be best for her to be evaluated in the ER regarding whether hospitalization for medication titration would be of benefit.  Otherwise, see if she could be added urgently to the APP schedule for evaluation.

## 2017-11-12 NOTE — Telephone Encounter (Signed)
Patient calling with c/o feeling extremely tired, stated that she did not even feel this tired before when her potassium was low.  Also, c/o SOB mostly with exertion that is becoming increasingly bothersome.  Gives out with minimal activity.  She states that her family is very concerned as she is supposed to be going OOT 6/2 thru 12/06/2017.  Will fwd to Dr. Domenic Polite as Dr. Bronson Ing is out of the office.

## 2017-11-12 NOTE — Telephone Encounter (Signed)
Patient notified and verbalized understanding. 

## 2017-11-12 NOTE — Telephone Encounter (Signed)
Patient of Dr. Bronson Ing.  He is currently out of the office and this message was forwarded to my in box for review.  Phone message string would indicate adjustment in medications and the fact that she is currently not using Lasix on a regular basis, presumably due to improvement in leg swelling.  Would check back on her status early next week with phone call and report back to Dr. Bronson Ing.

## 2017-11-12 NOTE — Telephone Encounter (Signed)
Patient spoke with other nurse in office. See phone note.

## 2017-11-12 NOTE — Telephone Encounter (Signed)
LMTCB

## 2017-11-14 ENCOUNTER — Ambulatory Visit: Payer: Medicare Other | Admitting: Cardiovascular Disease

## 2017-11-18 DIAGNOSIS — R0602 Shortness of breath: Secondary | ICD-10-CM | POA: Diagnosis not present

## 2017-11-18 DIAGNOSIS — I10 Essential (primary) hypertension: Secondary | ICD-10-CM | POA: Diagnosis not present

## 2017-11-18 DIAGNOSIS — I4891 Unspecified atrial fibrillation: Secondary | ICD-10-CM | POA: Diagnosis not present

## 2017-11-18 DIAGNOSIS — Z79899 Other long term (current) drug therapy: Secondary | ICD-10-CM | POA: Diagnosis not present

## 2017-11-18 DIAGNOSIS — Z299 Encounter for prophylactic measures, unspecified: Secondary | ICD-10-CM | POA: Diagnosis not present

## 2017-11-18 DIAGNOSIS — J984 Other disorders of lung: Secondary | ICD-10-CM | POA: Diagnosis not present

## 2017-11-18 DIAGNOSIS — R5383 Other fatigue: Secondary | ICD-10-CM | POA: Diagnosis not present

## 2017-11-18 DIAGNOSIS — R112 Nausea with vomiting, unspecified: Secondary | ICD-10-CM | POA: Diagnosis not present

## 2017-11-18 DIAGNOSIS — Z6826 Body mass index (BMI) 26.0-26.9, adult: Secondary | ICD-10-CM | POA: Diagnosis not present

## 2017-11-18 DIAGNOSIS — R05 Cough: Secondary | ICD-10-CM | POA: Diagnosis not present

## 2017-11-18 DIAGNOSIS — J069 Acute upper respiratory infection, unspecified: Secondary | ICD-10-CM | POA: Diagnosis not present

## 2017-11-19 ENCOUNTER — Encounter: Payer: Self-pay | Admitting: Cardiovascular Disease

## 2017-11-19 ENCOUNTER — Encounter: Payer: Self-pay | Admitting: *Deleted

## 2017-11-19 ENCOUNTER — Ambulatory Visit (INDEPENDENT_AMBULATORY_CARE_PROVIDER_SITE_OTHER): Payer: Medicare Other | Admitting: Cardiovascular Disease

## 2017-11-19 VITALS — BP 140/82 | Ht 67.0 in | Wt 159.0 lb

## 2017-11-19 DIAGNOSIS — J988 Other specified respiratory disorders: Secondary | ICD-10-CM

## 2017-11-19 DIAGNOSIS — R0602 Shortness of breath: Secondary | ICD-10-CM

## 2017-11-19 DIAGNOSIS — I10 Essential (primary) hypertension: Secondary | ICD-10-CM

## 2017-11-19 DIAGNOSIS — I6522 Occlusion and stenosis of left carotid artery: Secondary | ICD-10-CM

## 2017-11-19 DIAGNOSIS — Z9889 Other specified postprocedural states: Secondary | ICD-10-CM

## 2017-11-19 DIAGNOSIS — I4891 Unspecified atrial fibrillation: Secondary | ICD-10-CM | POA: Diagnosis not present

## 2017-11-19 DIAGNOSIS — E871 Hypo-osmolality and hyponatremia: Secondary | ICD-10-CM | POA: Diagnosis not present

## 2017-11-19 MED ORDER — METOPROLOL SUCCINATE ER 25 MG PO TB24: 25.0000 mg | ORAL_TABLET | Freq: Two times a day (BID) | ORAL | 3 refills | Status: DC

## 2017-11-19 MED ORDER — METOPROLOL SUCCINATE ER 25 MG PO TB24: 25.0000 mg | ORAL_TABLET | Freq: Every day | ORAL | 3 refills | Status: DC

## 2017-11-19 MED ORDER — AMLODIPINE BESYLATE 5 MG PO TABS: 5.0000 mg | ORAL_TABLET | Freq: Every day | ORAL | 3 refills | Status: DC

## 2017-11-19 MED ORDER — METOPROLOL SUCCINATE ER 100 MG PO TB24: 100.0000 mg | ORAL_TABLET | Freq: Two times a day (BID) | ORAL | 1 refills | Status: DC

## 2017-11-19 NOTE — Patient Instructions (Addendum)
Your physician wants you to follow-up in: 12/03/17 at 4 pm with Dr.Koneswaran     TAKE Eliquis 5 mg TWICE a day    DECREASE Amlodiopine to 5 mg daily    INCREASE Toprol XL to 125 mg daily  ( you will take your 100 mg tablet PLUS a 25 mg tablet)   TAKE Lasix 40 mg daily for 3 days and then resume as needed   TAKE Potassium 20 meq daily for 3 days and then resume as needed    Get lab work : BMET on 11/21/17      Thank you for choosing [facility] !

## 2017-11-19 NOTE — Addendum Note (Signed)
Addended by: Barbarann Ehlers A on: 11/19/2017 05:23 PM   Modules accepted: Orders

## 2017-11-19 NOTE — Progress Notes (Addendum)
SUBJECTIVE: The patient presents for follow-up of rapid atrial fibrillation.  She had been having some shortness of breath and ankle swelling and I put her on Lasix intermittently. She has called our office several times with a myriad of complaints.  She called on 11/12/2017 feeling extremely tired and exertional dyspnea.  ECG performed today which I personally reviewed demonstrated rapid atrial fibrillation, 118 bpm.  There are diffuse nonspecific ST segment and T wave abnormalities.  I reviewed her echocardiogram from 11/06/2017 which showed low normal left ventricular systolic function, LVEF 02%.  The patient was tachycardic throughout the study.  There is diffuse hypokinesis.  There was moderate mitral and tricuspid regurgitation and moderate left atrial dilatation.  Right ventricular systolic function was mildly reduced.  She saw her PCP yesterday complaining of cough, congestion, and nausea.  He obtained a chest x-ray which was performed at [facility] and demonstrated evidence of progressive chronic interstitial prominence and potential pulmonary fibrosis at the lung bases.  A component of infiltrates at the lung bases not entirely be excluded.  There was also evidence for new pleural thickening versus small pleural effusion at the left base.  I reviewed labs performed yesterday which demonstrated hyponatremia with a sodium of 129, potassium 4.8, TSH 2.45, BUN 26, creatinine 0.95, N-terminal proBNP elevated at 3959.  CBC was normal.  She is here with her daughter from the [place] area.  She was started on an antibiotic by her PCP.  She is scheduled to go on a bus trip to [place] with her sister on Monday, 11/24/2017.  I talked to her about postponing this.   Review of Systems: As per "subjective", otherwise negative.  Allergies  Allergen Reactions  . Rosuvastatin Other (See Comments)    MYALGIA's Muscle pain  . Other     PAIN MEDICATIONS/ANESTHESIA  MADE BP DROP LOW      Current Outpatient Medications  Medication Sig Dispense Refill  . amLODipine (NORVASC) 10 MG tablet Take 10 mg by mouth daily at 12 noon.     Marland Kitchen apixaban (ELIQUIS) 5 MG TABS tablet Take 1 tablet (5 mg total) by mouth 2 (two) times daily. 56 tablet 0  . butalbital-acetaminophen-caffeine (FIORICET, ESGIC) 50-325-40 MG tablet Take 1 tablet by mouth every 6 (six) hours as needed for headache or migraine.     . calcium-vitamin D (OSCAL WITH D) 500-200 MG-UNIT tablet Take 1 tablet by mouth daily.    . Cyanocobalamin (VITAMIN B 12 PO) Take 1 tablet by mouth daily.     Marland Kitchen estradiol (ESTRACE) 0.5 MG tablet Take 0.5 mg by mouth daily.     . fluticasone (FLONASE) 50 MCG/ACT nasal spray Place 1 spray into both nostrils daily.    . furosemide (LASIX) 40 MG tablet Take one tab daily x 3 days, then only as needed thereafter. 30 tablet 2  . Lidocaine HCl (ASPERCREME LIDOCAINE) 4 % LIQD Apply 1 application topically as needed (for pain).    Marland Kitchen losartan (COZAAR) 50 MG tablet Take 50 mg by mouth daily.    . magnesium hydroxide (MILK OF MAGNESIA) 400 MG/5ML suspension Take 30 mLs by mouth daily as needed for mild constipation.    . metoprolol succinate (TOPROL XL) 100 MG 24 hr tablet Take 1 tablet (100 mg total) by mouth 2 (two) times daily. Take with or immediately following a meal. 180 tablet 1  . Multiple Vitamins-Minerals (MULTIVITAMIN WITH MINERALS) tablet Take 1 tablet by mouth daily.    . naproxen  sodium (ANAPROX) 220 MG tablet Take 550 mg by mouth daily with breakfast. Takes 2.5 tablets    . Omega-3 Fatty Acids (FISH OIL) 1000 MG CAPS Take 1,000 mg by mouth daily.     . pantoprazole (PROTONIX) 40 MG tablet Take 40 mg by mouth daily before breakfast.     . potassium chloride SA (K-DUR,KLOR-CON) 20 MEQ tablet Take one tab daily x 3 days, then only as needed thereafter. 30 tablet 2   No current facility-administered medications for this visit.     Past Medical History:  Diagnosis Date  . Arthritis     oa, all over - multiple areas   . GERD (gastroesophageal reflux disease)   . Headache    h/o migraines   . Hypertension     Past Surgical History:  Procedure Laterality Date  . ABDOMINAL HYSTERECTOMY    . APPENDECTOMY    . BACK SURGERY    . ENDARTERECTOMY Left 03/27/2017   Procedure: ENDARTERECTOMY CAROTID LEFT;  Surgeon: [name], MD;  Location: [facility];  Service: Vascular;  Laterality: Left;  . INNER EAR SURGERY Right    puntured eardrum- repaired 2x's   . NASAL SINUS SURGERY     deviated septum  . PATCH ANGIOPLASTY Left 03/27/2017   Procedure: PATCH ANGIOPLASTY LEFT CAROTID ARTERY USING Rueben Bash BIOLOGIC PATCH;  Surgeon: [name], MD;  Location: [place];  Service: Vascular;  Laterality: Left;  . TONSILLECTOMY    . TOTAL KNEE ARTHROPLASTY Right 10/23/2016   Procedure: RIGHT TOTAL KNEE ARTHROPLASTY;  Surgeon: [name], MD;  Location: [place];  Service: Orthopedics;  Laterality: Right;    Social History   Socioeconomic History  . Marital status: Single    Spouse name: Not on file  . Number of children: Not on file  . Years of education: Not on file  . Highest education level: Not on file  Occupational History  . Not on file  Social Needs  . Financial resource strain: Not on file  . Food insecurity:    Worry: Not on file    Inability: Not on file  . Transportation needs:    Medical: Not on file    Non-medical: Not on file  Tobacco Use  . Smoking status: Never Smoker  . Smokeless tobacco: Never Used  Substance and Sexual Activity  . Alcohol use: No  . Drug use: No  . Sexual activity: Not on file  Lifestyle  . Physical activity:    Days per week: Not on file    Minutes per session: Not on file  . Stress: Not on file  Relationships  . Social connections:    Talks on phone: Not on file    Gets together: Not on file    Attends religious service: Not on file    Active member of club or organization: Not on file    Attends meetings of  clubs or organizations: Not on file    Relationship status: Not on file  . Intimate partner violence:    Fear of current or ex partner: Not on file    Emotionally abused: Not on file    Physically abused: Not on file    Forced sexual activity: Not on file  Other Topics Concern  . Not on file  Social History Narrative  . Not on file     Vitals:   11/19/17 1605  BP: 140/82  SpO2: 94%  Weight: 159 lb (72.1 kg)  Height: _0  (1.702 m)  Wt Readings from Last 3 Encounters:  11/19/17 159 lb (72.1 kg)  10/17/17 156 lb 11.2 oz (71.1 kg)  10/15/17 159 lb (72.1 kg)     PHYSICAL EXAM General: NAD HEENT: Normal. Neck: No JVD, no thyromegaly. Lungs: Clear to auscultation bilaterally with normal respiratory effort. CV: Tachycardic, irregular rhythm, normal S1 and S2, no S3, no murmur. No pretibial or periankle edema.   Abdomen: Soft, nontender, no distention.  Neurologic: Alert and oriented.  Psych: Normal affect. Skin: Normal. Musculoskeletal: No gross deformities.    ECG: Most recent ECG reviewed.   Labs: Lab Results  Component Value Date/Time   K 3.9 03/28/2017 03:15 AM   BUN 18 03/28/2017 03:15 AM   CREATININE 1.11 (H) 03/28/2017 03:15 AM   ALT 13 (L) 03/25/2017 02:18 PM   HGB 10.8 (L) 03/28/2017 03:15 AM     Lipids: No results found for: LDLCALC, LDLDIRECT, CHOL, TRIG, HDL     ASSESSMENT AND PLAN: 1.  Rapid atrial fibrillation: She is now on Toprol-XL 100 mg twice daily.  She has been instructed to take Eliquis 5 mg twice daily but has only been taking 2.5 mg twice daily.  I told her the importance of about taking 5 mg twice daily for CVA prevention.  I will reduce amlodipine to 5 mg and increase Toprol-XL to 125 mg twice daily.  If I am unable to adequately control her heart rate and she takes Eliquis 5 mg twice daily for at least 3 weeks, I would not rule out the possibility of direct-current cardioversion.  2. High-grade left internal carotid artery  stenosis status post carotid endarterectomy in October 2018: Stable.  She is on Eliquis for atrial fibrillation.  3.Acceleratedhypertension: Blood pressure is mildly elevated today.  I will monitor given reduction of amlodipine and increase of Toprol-XL.  4.  Shortness of breath: She may have some early CHF.  I will have her take Lasix 40 mg daily for 3 days and potassium 20 meq daily for 3 days and obtain a basic metabolic panel on 8/45.  5. Hyponatremia: As I am giving her 3 days of Lasix, I will obtain a basic metabolic panel on 3/64/6803.  6.  Possible respiratory tract infection: She is currently on antibiotics prescribed by her PCP.   Disposition: Follow up with me on 12/03/2017.  Time spent: 40 minutes, of which greater than 50% was spent reviewing symptoms, relevant blood tests and studies, and discussing management plan with the patient.    Kate Sable, M.D., F.A.C.C.

## 2017-11-21 ENCOUNTER — Telehealth: Payer: Self-pay | Admitting: Cardiovascular Disease

## 2017-11-21 DIAGNOSIS — R000 Tachycardia, unspecified: Secondary | ICD-10-CM | POA: Diagnosis not present

## 2017-11-21 NOTE — Telephone Encounter (Signed)
Will forward as an FYI.

## 2017-11-21 NOTE — Telephone Encounter (Signed)
Pt called stating she won't be in town for the 2 wk f/u w/ Dr. Raliegh Ip.   I r/s her for 12/17/17

## 2017-11-24 ENCOUNTER — Telehealth: Payer: Self-pay | Admitting: Cardiovascular Disease

## 2017-11-24 NOTE — Telephone Encounter (Signed)
Patient is on a trip at the moment. Daughter spoke with her today and she  is very concerned with how her mom is doing.  She asked that her mom be seen the week of June 17,2019.  She did not feel it woukd be best for her to wait until June 26,2019.  We did not have anything the week of June 17th so  Dr Bronson Ing offered December 03, 2017 at 4:20pm, but they declined, her mom would not be back from trip then).  As of now she is still scheduled December 17, 2017.  States that she is still ill and has been throwing up. Asked that Dr Bronson Ing nurse contact her

## 2017-11-24 NOTE — Telephone Encounter (Signed)
Returned call to daughter (Rudine).  Will check with other locations to see if they have any availability for the week of December 08, 2017.

## 2017-11-25 NOTE — Telephone Encounter (Signed)
Left message to return call 

## 2017-11-25 NOTE — Telephone Encounter (Signed)
Daughter (Rudine Sedley) returned call.  Earlier appointment given for Friday, 12/12/2017 at 1:30 with Rosaria Ferries, PA in the [place] office.

## 2017-12-03 ENCOUNTER — Ambulatory Visit: Payer: Medicare Other | Admitting: Cardiovascular Disease

## 2017-12-04 ENCOUNTER — Other Ambulatory Visit: Payer: Self-pay

## 2017-12-04 DIAGNOSIS — I6522 Occlusion and stenosis of left carotid artery: Secondary | ICD-10-CM

## 2017-12-12 ENCOUNTER — Telehealth: Payer: Self-pay | Admitting: *Deleted

## 2017-12-12 ENCOUNTER — Ambulatory Visit (INDEPENDENT_AMBULATORY_CARE_PROVIDER_SITE_OTHER): Payer: Medicare Other | Admitting: Physician Assistant

## 2017-12-12 ENCOUNTER — Encounter: Payer: Self-pay | Admitting: *Deleted

## 2017-12-12 ENCOUNTER — Encounter: Payer: Self-pay | Admitting: Physician Assistant

## 2017-12-12 VITALS — BP 124/80 | HR 86 | Ht 67.0 in | Wt 158.6 lb

## 2017-12-12 DIAGNOSIS — I10 Essential (primary) hypertension: Secondary | ICD-10-CM | POA: Diagnosis not present

## 2017-12-12 DIAGNOSIS — E8770 Fluid overload, unspecified: Secondary | ICD-10-CM | POA: Diagnosis not present

## 2017-12-12 DIAGNOSIS — I481 Persistent atrial fibrillation: Secondary | ICD-10-CM

## 2017-12-12 DIAGNOSIS — Z7901 Long term (current) use of anticoagulants: Secondary | ICD-10-CM

## 2017-12-12 DIAGNOSIS — I6522 Occlusion and stenosis of left carotid artery: Secondary | ICD-10-CM

## 2017-12-12 DIAGNOSIS — I4819 Other persistent atrial fibrillation: Secondary | ICD-10-CM

## 2017-12-12 MED ORDER — POTASSIUM CHLORIDE CRYS ER 20 MEQ PO TBCR: 20.0000 meq | EXTENDED_RELEASE_TABLET | Freq: Two times a day (BID) | ORAL | 2 refills | Status: DC

## 2017-12-12 MED ORDER — AMLODIPINE BESYLATE 5 MG PO TABS: 5.0000 mg | ORAL_TABLET | Freq: Every day | ORAL | 3 refills | Status: DC

## 2017-12-12 MED ORDER — FUROSEMIDE 40 MG PO TABS: 40.0000 mg | ORAL_TABLET | Freq: Two times a day (BID) | ORAL | 2 refills | Status: DC

## 2017-12-12 MED ORDER — PANTOPRAZOLE SODIUM 40 MG PO TBEC: 40.0000 mg | DELAYED_RELEASE_TABLET | Freq: Two times a day (BID) | ORAL | 3 refills | Status: DC

## 2017-12-12 MED ORDER — METOPROLOL SUCCINATE ER 50 MG PO TB24: 50.0000 mg | ORAL_TABLET | Freq: Every day | ORAL | 3 refills | Status: DC

## 2017-12-12 NOTE — Patient Instructions (Addendum)
Medication Instructions:   Your physician has recommended you make the following change in your medication:   Increase protonix 40 mg to twice daily  Change metoprolol succinate to 100 mg by mouth twice daily with 50 mg at lunch.  Take amlodipine 5 mg at lunch  Take losartan 50 mg at lunch  Increase furosemide 40 mg to twice daily for 3 days, then resume previous dose  Increase potassium 20 meq to twice daily for 3 days, then resume previous dose  Continue all other medications the same  Labwork:  Your physician recommends that you return for lab work in: today to check BMET, CBC  Testing/Procedures: Your physician has recommended that you have a Cardioversion (DCCV). Electrical Cardioversion uses a jolt of electricity to your heart either through paddles or wired patches attached to your chest. This is a controlled, usually prescheduled, procedure. Defibrillation is done under light anesthesia in the hospital, and you usually go home the day of the procedure. This is done to get your heart back into a normal rhythm. You are not awake for the procedure. Please see the instruction sheet given to you today.   Follow-Up:  Your physician recommends that you schedule a follow-up appointment in: 1 week after your cardioversion  Any Other Special Instructions Will Be Listed Below (If Applicable).  Limit the sodium in your diet to no more than 500 mg per meal  Limit your liquids to no more than 2 liters per day (2000 ml)  It is okay to hold your vitamins until you are seen at your next visit  If you need a refill on your cardiac medications before your next appointment, please call your pharmacy.

## 2017-12-12 NOTE — Telephone Encounter (Signed)
Per Rosaria Ferries, patient is to hold the evening before procedure and morning of procedure doses of Toprol XL. Patient can take her lunch dose the day before procedure. Patient can take all the rest of her medications that day and is to not miss any doses of her eliquis.  Patient informed and verbalized understanding of plan.

## 2017-12-12 NOTE — Progress Notes (Signed)
Cardiology Office Note   Date:  12/12/2017   ID:  NAUTIA LEM, DOB [birth year], MRN [id number]  PCP:  Glenda Chroman, MD  Cardiologist: Dr. Bronson Ing, 11/19/2017 Rosaria Ferries, PA-C   Chief Complaint  Patient presents with  . Atrial Fibrillation    History of Present Illness: Lydia Graham is a 82 y.o. female with a history of HTN, migraines, OA, L-CEA.  11/19/2017 office visit, patient diagnosed with atrial fibrillation, RVR.  Patient asked to postpone her bus trip to [place], on Toprol-XL 100 mg>>125 mg twice daily, Eliquis ordered at 5 mg twice daily but she was only taking 2.5 mg, if she takes the Eliquis as directed and does not convert spontaneously, consider cardioversion in 3 weeks, temporary Lasix 40 mg and K-Dur 20 mEq for 3 days, recheck hyponatremia with a sodium of 129 5/31 BMET, sodium 134, K+ 4.3, creatinine 1.21  Lydia Graham presents for cardiology follow up. Her sister and daughter are here with her today.   She is tired all the time from the metoprolol.  She has frequent palpitations.  She went on the trip with her sister.  Her sister followed her closely, taking her pulse and listening to her lungs.  Her breathing seems to have gotten worse and her legs are swelling.  She thought her heart rate was regular at times, and is thinking she was going in and out of atrial fibrillation.  Yesterday, she felt well and did housework. However, by lunch she was exhausted and had to lie down.  This is how she is most of the time. Either she is exhausted or she is feeling okay for a short time and then is exhausted.  She has had problems with N&V, she feels nauseated every day. She will get dry heaves or vomit most days. She wonders if this is coming from her meds.   She is taking the Lasix 40 mg every day, with potassium.  She took it for 3 days as directed, but then almost immediately noticed increased fluid and started back on it.   She is very tired of  being in atrial fibrillation.  She wants to get rid of it.  She requests ablation.   Past Medical History:  Diagnosis Date  . Arthritis    oa, all over - multiple areas   . GERD (gastroesophageal reflux disease)   . Headache    h/o migraines   . Hypertension     Past Surgical History:  Procedure Laterality Date  . ABDOMINAL HYSTERECTOMY    . APPENDECTOMY    . BACK SURGERY    . ENDARTERECTOMY Left 03/27/2017   Procedure: ENDARTERECTOMY CAROTID LEFT;  Surgeon: [name], MD;  Location: [facility];  Service: Vascular;  Laterality: Left;  . INNER EAR SURGERY Right    puntured eardrum- repaired 2x's   . NASAL SINUS SURGERY     deviated septum  . PATCH ANGIOPLASTY Left 03/27/2017   Procedure: PATCH ANGIOPLASTY LEFT CAROTID ARTERY USING Rueben Bash BIOLOGIC PATCH;  Surgeon: [name], MD;  Location: [facility];  Service: Vascular;  Laterality: Left;  . TONSILLECTOMY    . TOTAL KNEE ARTHROPLASTY Right 10/23/2016   Procedure: RIGHT TOTAL KNEE ARTHROPLASTY;  Surgeon: [name], MD;  Location: [facility];  Service: Orthopedics;  Laterality: Right;    Current Outpatient Medications  Medication Sig Dispense Refill  . amLODipine (NORVASC) 5 MG tablet Take 1 tablet (5 mg total) by mouth daily at 12 noon.  90 tablet 3  . apixaban (ELIQUIS) 5 MG TABS tablet Take 1 tablet (5 mg total) by mouth 2 (two) times daily. 56 tablet 0  . butalbital-acetaminophen-caffeine (FIORICET, ESGIC) 50-325-40 MG tablet Take 1 tablet by mouth every 6 (six) hours as needed for headache or migraine.     . calcium-vitamin D (OSCAL WITH D) 500-200 MG-UNIT tablet Take 1 tablet by mouth daily.    . Cyanocobalamin (VITAMIN B 12 PO) Take 1 tablet by mouth daily.     Marland Kitchen estradiol (ESTRACE) 0.5 MG tablet Take 0.5 mg by mouth daily.     . fluticasone (FLONASE) 50 MCG/ACT nasal spray Place 1 spray into both nostrils daily.    . furosemide (LASIX) 40 MG tablet Take one tab daily x 3 days, then only as needed  thereafter. 30 tablet 2  . Lidocaine HCl (ASPERCREME LIDOCAINE) 4 % LIQD Apply 1 application topically as needed (for pain).    Marland Kitchen losartan (COZAAR) 50 MG tablet Take 50 mg by mouth daily.    . magnesium hydroxide (MILK OF MAGNESIA) 400 MG/5ML suspension Take 30 mLs by mouth daily as needed for mild constipation.    . metoprolol succinate (TOPROL XL) 100 MG 24 hr tablet Take 1 tablet (100 mg total) by mouth 2 (two) times daily. Take with or immediately following a meal. 180 tablet 1  . metoprolol succinate (TOPROL XL) 25 MG 24 hr tablet Take 1 tablet (25 mg total) by mouth 2 (two) times daily. 180 tablet 3  . Multiple Vitamins-Minerals (MULTIVITAMIN WITH MINERALS) tablet Take 1 tablet by mouth daily.    . naproxen sodium (ANAPROX) 220 MG tablet Take 550 mg by mouth daily with breakfast. Takes 2.5 tablets    . Omega-3 Fatty Acids (FISH OIL) 1000 MG CAPS Take 1,000 mg by mouth daily.     . pantoprazole (PROTONIX) 40 MG tablet Take 40 mg by mouth daily before breakfast.     . potassium chloride SA (K-DUR,KLOR-CON) 20 MEQ tablet Take one tab daily x 3 days, then only as needed thereafter. 30 tablet 2   No current facility-administered medications for this visit.     Allergies:   Rosuvastatin and Other    Social History:  The patient  reports that she has never smoked. She has never used smokeless tobacco. She reports that she does not drink alcohol or use drugs.   Family History:  The patient's family history includes Stroke in her mother.    ROS:  Please see the history of present illness. All other systems are reviewed and negative.    PHYSICAL EXAM: VS:  BP 124/80   Pulse 86   Ht 5\' 7"  (1.702 m)   Wt 158 lb 9.6 oz (71.9 kg)   LMP  (LMP Unknown)   SpO2 97%   BMI 24.84 kg/m  , BMI Body mass index is 24.84 kg/m. GEN: Well nourished, well developed, female in no acute distress  HEENT: normal for age  Neck: JVD 9 cm, no carotid bruits appreciated, difficult to determine due to the  rapid and irregular heart rate, no masses Cardiac: Irregular and frequently rapid rate and rhythm; no murmur, no rubs, or gallops Respiratory: Decreased breath sounds bases with a few rales bilaterally, normal work of breathing GI: soft, nontender, nondistended, + BS MS: no deformity or atrophy; left greater than right edema; distal pulses are 2+ in all 4 extremities   Skin: warm and dry, no rash Neuro:  Strength and sensation are intact Psych: euthymic  mood, full affect   EKG:  EKG is ordered today. The ekg ordered today demonstrates atrial fibrillation with rapid ventricular response, heart rate 117  ECHO: 11/06/2017 - Left ventricle: Systolic function was difficult to assess due to   rapid atrial fibrillation, but is probably in the low normal   range. LVEF approximately 50%. The cavity size was normal. Wall   thickness was normal. Diffuse hypokinesis. The study was not   technically sufficient to allow evaluation of LV diastolic   dysfunction due to atrial fibrillation. - Mitral valve: There was moderate regurgitation. - Left atrium: The atrium was moderately dilated. - Right ventricle: Systolic function was mildly reduced. - Right atrium: The atrium was mildly dilated. - Tricuspid valve: There was moderate regurgitation. - Pulmonary arteries: PA peak pressure: 35 mm Hg (S). - Systemic veins: IVC is dilated with normal respiratory variation.   Estimated CVP 8 mmHg. - Pericardium, extracardiac: A trivial pericardial effusion was   identified.   Recent Labs: 03/25/2017: ALT 13 03/28/2017: BUN 18; Creatinine, Ser 1.11; Hemoglobin 10.8; Platelets 207; Potassium 3.9; Sodium 130    Lipid Panel No results found for: CHOL, TRIG, HDL, CHOLHDL, VLDL, LDLCALC, LDLDIRECT   Wt Readings from Last 3 Encounters:  12/12/17 158 lb 9.6 oz (71.9 kg)  11/19/17 159 lb (72.1 kg)  10/17/17 156 lb 11.2 oz (71.1 kg)     Other studies Reviewed: Additional studies/ records that were reviewed  today include: Office notes and testing.  ASSESSMENT AND PLAN:  1.  Persistent atrial fibrillation with rapid ventricular response: -She is tolerating the atrial fibrillation poorly. - It is hard to sort out side effects from medications from the effects of the atrial fibrillation itself. -She has now been compliant with the Eliquis at therapeutic doses for 3 weeks. - The risks and benefits of cardioversion have been discussed with the patient and her family, she is interested in pursuing this. -This was reviewed with Dr. Bronson Ing who agrees.  -He recommends that she hold the Toprol-XL 100 mg the night before and the morning of the cardioversion.  This information was communicated to the patient.  2.  Chronic anticoagulation: She is not having any bleeding issues on the Eliquis.  3.  Volume overload: Her EF is low normal at 50% and they were unable to calculate diastolic dysfunction due to the atrial fibrillation. -However, she has volume overload on exam. - Increase the Lasix to 40 mg twice daily for 3 days and then go back to 40 mg a day - She has been traveling recently and quite likely getting increased salt load compared to her normal intake. - Limit sodium to 500 mg per meal and all liquids to 2 L daily and follow.  Current medicines are reviewed at length with the patient today.  The patient does not have concerns regarding medicines.  The following changes have been made:  no change  Labs/ tests ordered today include:   Orders Placed This Encounter  Procedures  . EKG 12-Lead     Disposition:   FU with Dr. Yancey Flemings is 1  Signed, Rosaria Ferries, PA-C  12/12/2017 1:41 PM    [facility] Phone: [phone]; Fax: [phone]  This note was written with the assistance of speech recognition software. Please excuse any transcriptional errors.

## 2017-12-16 ENCOUNTER — Other Ambulatory Visit: Payer: Self-pay | Admitting: Cardiology

## 2017-12-17 ENCOUNTER — Encounter (HOSPITAL_COMMUNITY)
Admission: RE | Admit: 2017-12-17 | Discharge: 2017-12-17 | Disposition: A | Discharge status: Home / Self Care | Payer: Medicare Other | Source: Ambulatory Visit | Attending: Cardiology | Admitting: Cardiology

## 2017-12-17 ENCOUNTER — Other Ambulatory Visit: Payer: Self-pay

## 2017-12-17 ENCOUNTER — Encounter

## 2017-12-17 ENCOUNTER — Ambulatory Visit: Payer: Medicare Other | Admitting: Cardiovascular Disease

## 2017-12-17 ENCOUNTER — Encounter (HOSPITAL_COMMUNITY): Payer: Self-pay

## 2017-12-17 DIAGNOSIS — I739 Peripheral vascular disease, unspecified: Secondary | ICD-10-CM | POA: Diagnosis not present

## 2017-12-17 DIAGNOSIS — K219 Gastro-esophageal reflux disease without esophagitis: Secondary | ICD-10-CM | POA: Diagnosis not present

## 2017-12-17 DIAGNOSIS — Z7901 Long term (current) use of anticoagulants: Secondary | ICD-10-CM | POA: Diagnosis not present

## 2017-12-17 DIAGNOSIS — Z01812 Encounter for preprocedural laboratory examination: Secondary | ICD-10-CM

## 2017-12-17 DIAGNOSIS — Z7951 Long term (current) use of inhaled steroids: Secondary | ICD-10-CM | POA: Diagnosis not present

## 2017-12-17 DIAGNOSIS — Z79899 Other long term (current) drug therapy: Secondary | ICD-10-CM | POA: Diagnosis not present

## 2017-12-17 DIAGNOSIS — Z96651 Presence of right artificial knee joint: Secondary | ICD-10-CM | POA: Diagnosis not present

## 2017-12-17 DIAGNOSIS — E8770 Fluid overload, unspecified: Secondary | ICD-10-CM | POA: Diagnosis not present

## 2017-12-17 DIAGNOSIS — Z79890 Hormone replacement therapy (postmenopausal): Secondary | ICD-10-CM | POA: Diagnosis not present

## 2017-12-17 DIAGNOSIS — I481 Persistent atrial fibrillation: Secondary | ICD-10-CM | POA: Diagnosis not present

## 2017-12-17 DIAGNOSIS — I10 Essential (primary) hypertension: Secondary | ICD-10-CM | POA: Diagnosis not present

## 2017-12-17 HISTORY — DX: Cardiac arrhythmia, unspecified: I49.9

## 2017-12-17 LAB — CBC WITH DIFFERENTIAL/PLATELET
BASOS PCT: 1 %
Basophils Absolute: 0.1 10*3/uL (ref 0.0–0.1)
Eosinophils Absolute: 0.3 10*3/uL (ref 0.0–0.7)
Eosinophils Relative: 3 %
HEMATOCRIT: 42.9 % (ref 36.0–46.0)
Hemoglobin: 14.5 g/dL (ref 12.0–15.0)
Lymphocytes Relative: 14 %
Lymphs Abs: 1.3 10*3/uL (ref 0.7–4.0)
MCH: 31.3 pg (ref 26.0–34.0)
MCHC: 33.8 g/dL (ref 30.0–36.0)
MCV: 92.5 fL (ref 78.0–100.0)
Monocytes Absolute: 1 10*3/uL (ref 0.1–1.0)
Monocytes Relative: 11 %
NEUTROS ABS: 6.8 10*3/uL (ref 1.7–7.7)
NEUTROS PCT: 71 %
Platelets: 169 10*3/uL (ref 150–400)
RBC: 4.64 MIL/uL (ref 3.87–5.11)
RDW: 13.7 % (ref 11.5–15.5)
WBC: 9.4 10*3/uL (ref 4.0–10.5)

## 2017-12-17 LAB — BASIC METABOLIC PANEL
ANION GAP: 9 (ref 5–15)
BUN: 25 mg/dL — ABNORMAL HIGH (ref 8–23)
CALCIUM: 9 mg/dL (ref 8.9–10.3)
CHLORIDE: 98 mmol/L (ref 98–111)
CO2: 25 mmol/L (ref 22–32)
Creatinine, Ser: 1.26 mg/dL — ABNORMAL HIGH (ref 0.44–1.00)
GFR calc non Af Amer: 39 mL/min — ABNORMAL LOW (ref >60)
GFR, EST AFRICAN AMERICAN: 45 mL/min — AB (ref >60)
Glucose, Bld: 120 mg/dL — ABNORMAL HIGH (ref 70–99)
Potassium: 3.9 mmol/L (ref 3.5–5.1)
Sodium: 132 mmol/L — ABNORMAL LOW (ref 135–145)

## 2017-12-17 NOTE — Patient Instructions (Signed)
[place]  12/17/2017     @PREFPERIOPPHARMACY @   Your procedure is scheduled on  12/19/2017.  Report to [facility] at  710   A.M.  Call this number if you have problems the morning of surgery:  [phone]   Remember:  Do not eat or drink after midnight.  You may drink clear liquids until  12 midnight 12/18/2017 .  Clear liquids allowed are:                    Water, Juice (non-citric and without pulp), Carbonated beverages, Clear Tea, Black Coffee only, Plain Jell-O only, Gatorade and Plain Popsicles only    Take these medicines the morning of surgery with A SIP OF WATER  Amlodipine, fioricet, zyrtec, losartan, protonix.    Do not wear jewelry, make-up or nail polish.  Do not wear lotions, powders, or perfumes, or deodorant.  Do not shave 48 hours prior to surgery.  Men may shave face and neck.  Do not bring valuables to the hospital.  [facility] is not responsible for any belongings or valuables.  Contacts, dentures or bridgework may not be worn into surgery.  Leave your suitcase in the car.  After surgery it may be brought to your room.  For patients admitted to the hospital, discharge time will be determined by your treatment team.  Patients discharged the day of surgery will not be allowed to drive home.   Name and phone number of your driver:   Family  Special instructions:   None  Please read over the following fact sheets that you were given. Anesthesia Post-op Instructions and Care and Recovery After Surgery       Electrical Cardioversion Electrical cardioversion is the delivery of a jolt of electricity to restore a normal rhythm to the heart. A rhythm that is too fast or is not regular keeps the heart from pumping well. In this procedure, sticky patches or metal paddles are placed on the chest to deliver electricity to the heart from a device. This procedure may be done in an emergency if:  There is low or no blood pressure as a result of  the heart rhythm.  Normal rhythm must be restored as fast as possible to protect the brain and heart from further damage.  It may save a life.  This procedure may also be done for irregular or fast heart rhythms that are not immediately life-threatening. Tell a health care provider about:  Any allergies you have.  All medicines you are taking, including vitamins, herbs, eye drops, creams, and over-the-counter medicines.  Any problems you or family members have had with anesthetic medicines.  Any blood disorders you have.  Any surgeries you have had.  Any medical conditions you have.  Whether you are pregnant or may be pregnant. What are the risks? Generally, this is a safe procedure. However, problems may occur, including:  Allergic reactions to medicines.  A blood clot that breaks free and travels to other parts of your body.  The possible return of an abnormal heart rhythm within hours or days after the procedure.  Your heart stopping (cardiac arrest). This is rare.  What happens before the procedure? Medicines  Your health care provider may have you start taking: ? Blood-thinning medicines (anticoagulants) so your blood does not clot as easily. ? Medicines may be given to help stabilize your heart rate and rhythm.  Ask your health care provider  about changing or stopping your regular medicines. This is especially important if you are taking diabetes medicines or blood thinners. General instructions  Plan to have someone take you home from the hospital or clinic.  If you will be going home right after the procedure, plan to have someone with you for 24 hours.  Follow instructions from your health care provider about eating or drinking restrictions. What happens during the procedure?  To lower your risk of infection: ? Your health care team will wash or sanitize their hands. ? Your skin will be washed with soap.  An IV tube will be inserted into one of your  veins.  You will be given a medicine to help you relax (sedative).  Sticky patches (electrodes) or metal paddles may be placed on your chest.  An electrical shock will be delivered. The procedure may vary among health care providers and hospitals. What happens after the procedure?  Your blood pressure, heart rate, breathing rate, and blood oxygen level will be monitored until the medicines you were given have worn off.  Do not drive for 24 hours if you were given a sedative.  Your heart rhythm will be watched to make sure it does not change. This information is not intended to replace advice given to you by your health care provider. Make sure you discuss any questions you have with your health care provider. Document Released: 05/31/2002 Document Revised: 02/07/2016 Document Reviewed: 12/15/2015 Elsevier Interactive Patient Education  2017 [employer].  Electrical Cardioversion, Care After This sheet gives you information about how to care for yourself after your procedure. Your health care provider may also give you more specific instructions. If you have problems or questions, contact your health care provider. What can I expect after the procedure? After the procedure, it is common to have:  Some redness on the skin where the shocks were given.  Follow these instructions at home:  Do not drive for 24 hours if you were given a medicine to help you relax (sedative).  Take over-the-counter and prescription medicines only as told by your health care provider.  Ask your health care provider how to check your pulse. Check it often.  Rest for 48 hours after the procedure or as told by your health care provider.  Avoid or limit your caffeine use as told by your health care provider. Contact a health care provider if:  You feel like your heart is beating too quickly or your pulse is not regular.  You have a serious muscle cramp that does not go away. Get help right away  if:  You have discomfort in your chest.  You are dizzy or you feel faint.  You have trouble breathing or you are short of breath.  Your speech is slurred.  You have trouble moving an arm or leg on one side of your body.  Your fingers or toes turn cold or blue. This information is not intended to replace advice given to you by your health care provider. Make sure you discuss any questions you have with your health care provider. Document Released: 03/31/2013 Document Revised: 01/12/2016 Document Reviewed: 12/15/2015 Elsevier Interactive Patient Education  2018 [place] Anesthesia is a term that refers to techniques, procedures, and medicines that help a person stay safe and comfortable during a medical procedure. Monitored anesthesia care, or sedation, is one type of anesthesia. Your anesthesia specialist may recommend sedation if you will be having a procedure that does not require  you to be unconscious, such as:  Cataract surgery.  A dental procedure.  A biopsy.  A colonoscopy.  During the procedure, you may receive a medicine to help you relax (sedative). There are three levels of sedation:  Mild sedation. At this level, you may feel awake and relaxed. You will be able to follow directions.  Moderate sedation. At this level, you will be sleepy. You may not remember the procedure.  Deep sedation. At this level, you will be asleep. You will not remember the procedure.  The more medicine you are given, the deeper your level of sedation will be. Depending on how you respond to the procedure, the anesthesia specialist may change your level of sedation or the type of anesthesia to fit your needs. An anesthesia specialist will monitor you closely during the procedure. Let your health care provider know about:  Any allergies you have.  All medicines you are taking, including vitamins, herbs, eye drops, creams, and over-the-counter medicines.  Any  use of steroids (by mouth or as a cream).  Any problems you or family members have had with sedatives and anesthetic medicines.  Any blood disorders you have.  Any surgeries you have had.  Any medical conditions you have, such as sleep apnea.  Whether you are pregnant or may be pregnant.  Any use of cigarettes, alcohol, or street drugs. What are the risks? Generally, this is a safe procedure. However, problems may occur, including:  Getting too much medicine (oversedation).  Nausea.  Allergic reaction to medicines.  Trouble breathing. If this happens, a breathing tube may be used to help with breathing. It will be removed when you are awake and breathing on your own.  Heart trouble.  Lung trouble.  Before the procedure Staying hydrated Follow instructions from your health care provider about hydration, which may include:  Up to 2 hours before the procedure - you may continue to drink clear liquids, such as water, clear fruit juice, black coffee, and plain tea.  Eating and drinking restrictions Follow instructions from your health care provider about eating and drinking, which may include:  8 hours before the procedure - stop eating heavy meals or foods such as meat, fried foods, or fatty foods.  6 hours before the procedure - stop eating light meals or foods, such as toast or cereal.  6 hours before the procedure - stop drinking milk or drinks that contain milk.  2 hours before the procedure - stop drinking clear liquids.  Medicines Ask your health care provider about:  Changing or stopping your regular medicines. This is especially important if you are taking diabetes medicines or blood thinners.  Taking medicines such as aspirin and ibuprofen. These medicines can thin your blood. Do not take these medicines before your procedure if your health care provider instructs you not to.  Tests and exams  You will have a physical exam.  You may have blood tests done  to show: ? How well your kidneys and liver are working. ? How well your blood can clot.  General instructions  Plan to have someone take you home from the hospital or clinic.  If you will be going home right after the procedure, plan to have someone with you for 24 hours.  What happens during the procedure?  Your blood pressure, heart rate, breathing, level of pain and overall condition will be monitored.  An IV tube will be inserted into one of your veins.  Your anesthesia specialist will give you medicines  as needed to keep you comfortable during the procedure. This may mean changing the level of sedation.  The procedure will be performed. After the procedure  Your blood pressure, heart rate, breathing rate, and blood oxygen level will be monitored until the medicines you were given have worn off.  Do not drive for 24 hours if you received a sedative.  You may: ? Feel sleepy, clumsy, or nauseous. ? Feel forgetful about what happened after the procedure. ? Have a sore throat if you had a breathing tube during the procedure. ? Vomit. This information is not intended to replace advice given to you by your health care provider. Make sure you discuss any questions you have with your health care provider. Document Released: 03/06/2005 Document Revised: 11/17/2015 Document Reviewed: 10/01/2015 Elsevier Interactive Patient Education  2018 [place], Care After These instructions provide you with information about caring for yourself after your procedure. Your health care provider may also give you more specific instructions. Your treatment has been planned according to current medical practices, but problems sometimes occur. Call your health care provider if you have any problems or questions after your procedure. What can I expect after the procedure? After your procedure, it is common to:  Feel sleepy for several hours.  Feel clumsy and have poor  balance for several hours.  Feel forgetful about what happened after the procedure.  Have poor judgment for several hours.  Feel nauseous or vomit.  Have a sore throat if you had a breathing tube during the procedure.  Follow these instructions at home: For at least 24 hours after the procedure:   Do not: ? Participate in activities in which you could fall or become injured. ? Drive. ? Use heavy machinery. ? Drink alcohol. ? Take sleeping pills or medicines that cause drowsiness. ? Make important decisions or sign legal documents. ? Take care of children on your own.  Rest. Eating and drinking  Follow the diet that is recommended by your health care provider.  If you vomit, drink water, juice, or soup when you can drink without vomiting.  Make sure you have little or no nausea before eating solid foods. General instructions  Have a responsible adult stay with you until you are awake and alert.  Take over-the-counter and prescription medicines only as told by your health care provider.  If you smoke, do not smoke without supervision.  Keep all follow-up visits as told by your health care provider. This is important. Contact a health care provider if:  You keep feeling nauseous or you keep vomiting.  You feel light-headed.  You develop a rash.  You have a fever. Get help right away if:  You have trouble breathing. This information is not intended to replace advice given to you by your health care provider. Make sure you discuss any questions you have with your health care provider. Document Released: 10/01/2015 Document Revised: 01/31/2016 Document Reviewed: 10/01/2015 Elsevier Interactive Patient Education  [employer].

## 2017-12-19 ENCOUNTER — Encounter (HOSPITAL_COMMUNITY): Payer: Self-pay | Admitting: *Deleted

## 2017-12-19 ENCOUNTER — Ambulatory Visit (HOSPITAL_COMMUNITY): Payer: Medicare Other | Admitting: Anesthesiology

## 2017-12-19 ENCOUNTER — Encounter (HOSPITAL_COMMUNITY)
Admission: RE | Disposition: A | Discharge status: Home / Self Care | Payer: Self-pay | Source: Ambulatory Visit | Attending: Cardiology

## 2017-12-19 ENCOUNTER — Ambulatory Visit (HOSPITAL_COMMUNITY)
Admission: RE | Admit: 2017-12-19 | Discharge: 2017-12-19 | Disposition: A | Discharge status: Home / Self Care | Payer: Medicare Other | Source: Ambulatory Visit | Attending: Cardiology | Admitting: Cardiology

## 2017-12-19 DIAGNOSIS — I481 Persistent atrial fibrillation: Secondary | ICD-10-CM | POA: Insufficient documentation

## 2017-12-19 DIAGNOSIS — Z79899 Other long term (current) drug therapy: Secondary | ICD-10-CM | POA: Insufficient documentation

## 2017-12-19 DIAGNOSIS — I739 Peripheral vascular disease, unspecified: Secondary | ICD-10-CM | POA: Insufficient documentation

## 2017-12-19 DIAGNOSIS — I10 Essential (primary) hypertension: Secondary | ICD-10-CM | POA: Diagnosis not present

## 2017-12-19 DIAGNOSIS — Z79890 Hormone replacement therapy (postmenopausal): Secondary | ICD-10-CM | POA: Diagnosis not present

## 2017-12-19 DIAGNOSIS — Z7901 Long term (current) use of anticoagulants: Secondary | ICD-10-CM | POA: Insufficient documentation

## 2017-12-19 DIAGNOSIS — Z7951 Long term (current) use of inhaled steroids: Secondary | ICD-10-CM | POA: Insufficient documentation

## 2017-12-19 DIAGNOSIS — I4891 Unspecified atrial fibrillation: Secondary | ICD-10-CM | POA: Diagnosis not present

## 2017-12-19 DIAGNOSIS — Z96651 Presence of right artificial knee joint: Secondary | ICD-10-CM | POA: Insufficient documentation

## 2017-12-19 DIAGNOSIS — K219 Gastro-esophageal reflux disease without esophagitis: Secondary | ICD-10-CM | POA: Insufficient documentation

## 2017-12-19 DIAGNOSIS — E8770 Fluid overload, unspecified: Secondary | ICD-10-CM | POA: Insufficient documentation

## 2017-12-19 HISTORY — PX: CARDIOVERSION: SHX1299

## 2017-12-19 SURGERY — CARDIOVERSION
Anesthesia: Monitor Anesthesia Care

## 2017-12-19 MED ORDER — PROPOFOL 10 MG/ML IV BOLUS
INTRAVENOUS | Status: DC | PRN
  Administered 2017-12-19 (×3): 30 mg via INTRAVENOUS

## 2017-12-19 MED ORDER — SODIUM CHLORIDE 0.9 % IJ SOLN
INTRAMUSCULAR | Status: AC
  Filled 2017-12-19: qty 10

## 2017-12-19 MED ORDER — LACTATED RINGERS IV SOLN
INTRAVENOUS | Status: DC
  Administered 2017-12-19: 1000 mL via INTRAVENOUS

## 2017-12-19 MED ORDER — LIDOCAINE HCL (PF) 1 % IJ SOLN
INTRAMUSCULAR | Status: AC
  Filled 2017-12-19: qty 5

## 2017-12-19 MED ORDER — PROPOFOL 500 MG/50ML IV EMUL: INTRAVENOUS | Status: DC | PRN

## 2017-12-19 MED ORDER — EPHEDRINE SULFATE 50 MG/ML IJ SOLN
INTRAMUSCULAR | Status: AC
Start: 2017-12-19 — End: ?
  Filled 2017-12-19: qty 1

## 2017-12-19 MED ORDER — PROPOFOL 10 MG/ML IV BOLUS
INTRAVENOUS | Status: AC
  Filled 2017-12-19: qty 20

## 2017-12-19 NOTE — Anesthesia Preprocedure Evaluation (Signed)
Anesthesia Evaluation  Patient identified by MRN, date of birth, ID band Patient awake    Reviewed: Allergy & Precautions, H&P , NPO status , Patient's Chart, lab work & pertinent test results  Airway Mallampati: II  TM Distance: >3 FB Neck ROM: full    Dental no notable dental hx.    Pulmonary neg pulmonary ROS,    Pulmonary exam normal breath sounds clear to auscultation       Cardiovascular Exercise Tolerance: Good hypertension, + Peripheral Vascular Disease  negative cardio ROS  + dysrhythmias Atrial Fibrillation  Rhythm:irregular Rate:Tachycardia     Neuro/Psych  Headaches, negative neurological ROS  negative psych ROS   GI/Hepatic negative GI ROS, Neg liver ROS, GERD  ,  Endo/Other  negative endocrine ROS  Renal/GU negative Renal ROS  negative genitourinary   Musculoskeletal negative musculoskeletal ROS (+)   Abdominal   Peds negative pediatric ROS (+)  Hematology negative hematology ROS (+)   Anesthesia Other Findings   Reproductive/Obstetrics negative OB ROS                             Anesthesia Physical Anesthesia Plan  ASA: III  Anesthesia Plan: MAC   Post-op Pain Management:    Induction:   PONV Risk Score and Plan:   Airway Management Planned:   Additional Equipment:   Intra-op Plan:   Post-operative Plan:   Informed Consent: I have reviewed the patients History and Physical, chart, labs and discussed the procedure including the risks, benefits and alternatives for the proposed anesthesia with the patient or authorized representative who has indicated his/her understanding and acceptance.   Dental Advisory Given  Plan Discussed with: CRNA  Anesthesia Plan Comments:         Anesthesia Quick Evaluation

## 2017-12-19 NOTE — CV Procedure (Signed)
Procedure: electrical cardioversion Physician: Dr [name] Md Indication: afib   Patient was brought to the procedure suite after appropirate consent was obtained. Defib pads were placed in the anterior and posterior positions. Sedation was achieved with the assistance of anesthesiology, for full details please refer to there note. She was succesfully cardioverted from afib with RVR to NSR at 65 with a single synchronized 200j shock. Cardiopulmonary monitoring was performed throughout the procedure, she tolerated well without complications.   [name] MD

## 2017-12-19 NOTE — Transfer of Care (Signed)
Immediate Anesthesia Transfer of Care Note  Patient: Lydia Graham  Procedure(s) Performed: CARDIOVERSION (N/A )  Patient Location: PACU  Anesthesia Type:MAC  Level of Consciousness: awake, alert  and oriented  Airway & Oxygen Therapy: Patient Spontanous Breathing  Post-op Assessment: Report given to RN  Post vital signs: Reviewed and stable  Last Vitals:  Vitals Value Taken Time  BP 118/66 12/19/2017  9:40 AM  Temp    Pulse 66 12/19/2017  9:41 AM  Resp 20 12/19/2017  9:41 AM  SpO2 91 % 12/19/2017  9:41 AM  Vitals shown include unvalidated device data.  Last Pain:  Vitals:   12/19/17 0752  TempSrc: Oral  PainSc: 0-No pain      Patients Stated Pain Goal: 5 (27/07/86 7544)  Complications: No apparent anesthesia complications

## 2017-12-19 NOTE — Progress Notes (Signed)
Electrical Cardioversion Procedure Note Lydia Graham [id number] 1936-02-05  Procedure: Electrical Cardioversion Indications:  Atrial Fibrillation  Procedure Details Consent: Risks of procedure as well as the alternatives and risks of each were explained to the (patient/caregiver).  Consent for procedure obtained. Time Out: Verified patient identification, verified procedure, site/side was marked, verified correct patient position, special equipment/implants available, medications/allergies/relevent history reviewed, required imaging and test results available.  Performed  Patient placed on cardiac monitor, pulse oximetry, supplemental oxygen as necessary.  Sedation given: Benzodiazepines Pacer pads placed anterior and posterior chest.  Cardioverted 1 time(s).  Cardioverted at [place].  Evaluation Findings: Post procedure EKG shows: NSR Complications: None Patient did tolerate procedure well.   Lydia Graham 12/19/2017, 10:21 AM   0919-time out performed. 0928-post cardioversion EKG done.

## 2017-12-19 NOTE — H&P (Addendum)
Procedure H&P  Please see referenced recent clinic note below for full history. Patient presents for elective electrical cardioversion for afib. She has been on eliquis over 3 weeks which I verbally verified with the patient this morning. . Plan for electrical cardioversion today.   [name] MD        Chief Complaint  Patient presents with  . Atrial Fibrillation    History of Present Illness: Lydia Graham is a 82 y.o. female with a history of HTN, migraines, OA, L-CEA.  11/19/2017 office visit, patient diagnosed with atrial fibrillation, RVR.  Patient asked to postpone her bus trip to [place], on Toprol-XL 100 mg>>125 mg twice daily, Eliquis ordered at 5 mg twice daily but she was only taking 2.5 mg, if she takes the Eliquis as directed and does not convert spontaneously, consider cardioversion in 3 weeks, temporary Lasix 40 mg and K-Dur 20 mEq for 3 days, recheck hyponatremia with a sodium of 129 5/31 BMET, sodium 134, K+ 4.3, creatinine 1.21  Lydia Graham presents for cardiology follow up. Her sister and daughter are here with her today.   She is tired all the time from the metoprolol.  She has frequent palpitations.  She went on the trip with her sister.  Her sister followed her closely, taking her pulse and listening to her lungs.  Her breathing seems to have gotten worse and her legs are swelling.  She thought her heart rate was regular at times, and is thinking she was going in and out of atrial fibrillation.  Yesterday, she felt well and did housework. However, by lunch she was exhausted and had to lie down.  This is how she is most of the time. Either she is exhausted or she is feeling okay for a short time and then is exhausted.  She has had problems with N&V, she feels nauseated every day. She will get dry heaves or vomit most days. She wonders if this is coming from her meds.   She is taking the Lasix 40 mg every day, with potassium.  She took it  for 3 days as directed, but then almost immediately noticed increased fluid and started back on it.   She is very tired of being in atrial fibrillation.  She wants to get rid of it.  She requests ablation.       Past Medical History:  Diagnosis Date  . Arthritis    oa, all over - multiple areas   . GERD (gastroesophageal reflux disease)   . Headache    h/o migraines   . Hypertension          Past Surgical History:  Procedure Laterality Date  . ABDOMINAL HYSTERECTOMY    . APPENDECTOMY    . BACK SURGERY    . ENDARTERECTOMY Left 03/27/2017   Procedure: ENDARTERECTOMY CAROTID LEFT;  Surgeon: [name], MD;  Location: [place];  Service: Vascular;  Laterality: Left;  . INNER EAR SURGERY Right    puntured eardrum- repaired 2x's   . NASAL SINUS SURGERY     deviated septum  . PATCH ANGIOPLASTY Left 03/27/2017   Procedure: PATCH ANGIOPLASTY LEFT CAROTID ARTERY USING Rueben Bash BIOLOGIC PATCH;  Surgeon: [name], MD;  Location: [place];  Service: Vascular;  Laterality: Left;  . TONSILLECTOMY    . TOTAL KNEE ARTHROPLASTY Right 10/23/2016   Procedure: RIGHT TOTAL KNEE ARTHROPLASTY;  Surgeon: [name], MD;  Location: [place];  Service: Orthopedics;  Laterality: Right;  Current Outpatient Medications  Medication Sig Dispense Refill  . amLODipine (NORVASC) 5 MG tablet Take 1 tablet (5 mg total) by mouth daily at 12 noon. 90 tablet 3  . apixaban (ELIQUIS) 5 MG TABS tablet Take 1 tablet (5 mg total) by mouth 2 (two) times daily. 56 tablet 0  . butalbital-acetaminophen-caffeine (FIORICET, ESGIC) 50-325-40 MG tablet Take 1 tablet by mouth every 6 (six) hours as needed for headache or migraine.     . calcium-vitamin D (OSCAL WITH D) 500-200 MG-UNIT tablet Take 1 tablet by mouth daily.    . Cyanocobalamin (VITAMIN B 12 PO) Take 1 tablet by mouth daily.     Marland Kitchen estradiol (ESTRACE) 0.5 MG tablet Take 0.5 mg by mouth daily.      . fluticasone (FLONASE) 50 MCG/ACT nasal spray Place 1 spray into both nostrils daily.    . furosemide (LASIX) 40 MG tablet Take one tab daily x 3 days, then only as needed thereafter. 30 tablet 2  . Lidocaine HCl (ASPERCREME LIDOCAINE) 4 % LIQD Apply 1 application topically as needed (for pain).    Marland Kitchen losartan (COZAAR) 50 MG tablet Take 50 mg by mouth daily.    . magnesium hydroxide (MILK OF MAGNESIA) 400 MG/5ML suspension Take 30 mLs by mouth daily as needed for mild constipation.    . metoprolol succinate (TOPROL XL) 100 MG 24 hr tablet Take 1 tablet (100 mg total) by mouth 2 (two) times daily. Take with or immediately following a meal. 180 tablet 1  . metoprolol succinate (TOPROL XL) 25 MG 24 hr tablet Take 1 tablet (25 mg total) by mouth 2 (two) times daily. 180 tablet 3  . Multiple Vitamins-Minerals (MULTIVITAMIN WITH MINERALS) tablet Take 1 tablet by mouth daily.    . naproxen sodium (ANAPROX) 220 MG tablet Take 550 mg by mouth daily with breakfast. Takes 2.5 tablets    . Omega-3 Fatty Acids (FISH OIL) 1000 MG CAPS Take 1,000 mg by mouth daily.     . pantoprazole (PROTONIX) 40 MG tablet Take 40 mg by mouth daily before breakfast.     . potassium chloride SA (K-DUR,KLOR-CON) 20 MEQ tablet Take one tab daily x 3 days, then only as needed thereafter. 30 tablet 2   No current facility-administered medications for this visit.     Allergies:   Rosuvastatin and Other    Social History:  The patient  reports that she has never smoked. She has never used smokeless tobacco. She reports that she does not drink alcohol or use drugs.   Family History:  The patient's family history includes Stroke in her mother.    ROS:  Please see the history of present illness. All other systems are reviewed and negative.    PHYSICAL EXAM: VS:  BP 124/80   Pulse 86   Ht 5\' 7"  (1.702 m)   Wt 158 lb 9.6 oz (71.9 kg)   LMP  (LMP Unknown)   SpO2 97%   BMI 24.84 kg/m  , BMI Body  mass index is 24.84 kg/m. GEN: Well nourished, well developed, female in no acute distress  HEENT: normal for age  Neck: JVD 9 cm, no carotid bruits appreciated, difficult to determine due to the rapid and irregular heart rate, no masses Cardiac: Irregular and frequently rapid rate and rhythm; no murmur, no rubs, or gallops Respiratory: Decreased breath sounds bases with a few rales bilaterally, normal work of breathing GI: soft, nontender, nondistended, + BS MS: no deformity or atrophy; left greater than  right edema; distal pulses are 2+ in all 4 extremities   Skin: warm and dry, no rash Neuro:  Strength and sensation are intact Psych: euthymic mood, full affect   EKG:  EKG is ordered today. The ekg ordered today demonstrates atrial fibrillation with rapid ventricular response, heart rate 117  ECHO: 11/06/2017 - Left ventricle: Systolic function was difficult to assess due to rapid atrial fibrillation, but is probably in the low normal range. LVEF approximately 50%. The cavity size was normal. Wall thickness was normal. Diffuse hypokinesis. The study was not technically sufficient to allow evaluation of LV diastolic dysfunction due to atrial fibrillation. - Mitral valve: There was moderate regurgitation. - Left atrium: The atrium was moderately dilated. - Right ventricle: Systolic function was mildly reduced. - Right atrium: The atrium was mildly dilated. - Tricuspid valve: There was moderate regurgitation. - Pulmonary arteries: PA peak pressure: 35 mm Hg (S). - Systemic veins: IVC is dilated with normal respiratory variation. Estimated CVP 8 mmHg. - Pericardium, extracardiac: A trivial pericardial effusion was identified.   Recent Labs: 03/25/2017: ALT 13 03/28/2017: BUN 18; Creatinine, Ser 1.11; Hemoglobin 10.8; Platelets 207; Potassium 3.9; Sodium 130    Lipid Panel Labs(Brief)  No results found for: CHOL, TRIG, HDL, CHOLHDL, VLDL, LDLCALC, LDLDIRECT         Wt Readings from Last 3 Encounters:  12/12/17 158 lb 9.6 oz (71.9 kg)  11/19/17 159 lb (72.1 kg)  10/17/17 156 lb 11.2 oz (71.1 kg)     Other studies Reviewed: Additional studies/ records that were reviewed today include: Office notes and testing.  ASSESSMENT AND PLAN:  1.  Persistent atrial fibrillation with rapid ventricular response: -She is tolerating the atrial fibrillation poorly. - It is hard to sort out side effects from medications from the effects of the atrial fibrillation itself. -She has now been compliant with the Eliquis at therapeutic doses for 3 weeks. - The risks and benefits of cardioversion have been discussed with the patient and her family, she is interested in pursuing this. -This was reviewed with Dr. Bronson Ing who agrees.  -He recommends that she hold the Toprol-XL 100 mg the night before and the morning of the cardioversion.  This information was communicated to the patient.  2.  Chronic anticoagulation: She is not having any bleeding issues on the Eliquis.  3.  Volume overload: Her EF is low normal at 50% and they were unable to calculate diastolic dysfunction due to the atrial fibrillation. -However, she has volume overload on exam. - Increase the Lasix to 40 mg twice daily for 3 days and then go back to 40 mg a day - She has been traveling recently and quite likely getting increased salt load compared to her normal intake. - Limit sodium to 500 mg per meal and all liquids to 2 L daily and follow.  Current medicines are reviewed at length with the patient today.  The patient does not have concerns regarding medicines.  The following changes have been made:  no change  Labs/ tests ordered today include:      Orders Placed This Encounter  Procedures  . EKG 12-Lead     Disposition:   FU with Dr. Yancey Flemings is 1  Signed, Rosaria Ferries, PA-C  12/12/2017 1:41 PM

## 2017-12-19 NOTE — Anesthesia Postprocedure Evaluation (Signed)
Anesthesia Post Note  Patient: [name]  Procedure(s) Performed: CARDIOVERSION (N/A )  Patient location during evaluation: PACU Anesthesia Type: MAC Level of consciousness: awake and alert and oriented Pain management: pain level controlled Vital Signs Assessment: post-procedure vital signs reviewed and stable Respiratory status: spontaneous breathing Cardiovascular status: blood pressure returned to baseline Postop Assessment: no apparent nausea or vomiting and adequate PO intake Anesthetic complications: no     Last Vitals:  Vitals:   12/19/17 0800 12/19/17 0805  BP: (!) 137/100 139/87  Pulse:    Resp: (!) 31 (!) 30  Temp:    SpO2: 98% 97%    Last Pain:  Vitals:   12/19/17 0752  TempSrc: Oral  PainSc: 0-No pain                 Stephan Draughn

## 2017-12-19 NOTE — Discharge Instructions (Signed)
Monitored Anesthesia Care, Care After °These instructions provide you with information about caring for yourself after your procedure. Your health care provider may also give you more specific instructions. Your treatment has been planned according to current medical practices, but problems sometimes occur. Call your health care provider if you have any problems or questions after your procedure. °What can I expect after the procedure? °After your procedure, it is common to: °· Feel sleepy for several hours. °· Feel clumsy and have poor balance for several hours. °· Feel forgetful about what happened after the procedure. °· Have poor judgment for several hours. °· Feel nauseous or vomit. °· Have a sore throat if you had a breathing tube during the procedure. ° °Follow these instructions at home: °For at least 24 hours after the procedure: ° °· Do not: °? Participate in activities in which you could fall or become injured. °? Drive. °? Use heavy machinery. °? Drink alcohol. °? Take sleeping pills or medicines that cause drowsiness. °? Make important decisions or sign legal documents. °? Take care of children on your own. °· Rest. °Eating and drinking °· Follow the diet that is recommended by your health care provider. °· If you vomit, drink water, juice, or soup when you can drink without vomiting. °· Make sure you have little or no nausea before eating solid foods. °General instructions °· Have a responsible adult stay with you until you are awake and alert. °· Take over-the-counter and prescription medicines only as told by your health care provider. °· If you smoke, do not smoke without supervision. °· Keep all follow-up visits as told by your health care provider. This is important. °Contact a health care provider if: °· You keep feeling nauseous or you keep vomiting. °· You feel light-headed. °· You develop a rash. °· You have a fever. °Get help right away if: °· You have trouble breathing. °This information is  not intended to replace advice given to you by your health care provider. Make sure you discuss any questions you have with your health care provider. °Document Released: 10/01/2015 Document Revised: 01/31/2016 Document Reviewed: 10/01/2015 °[employer]vier Interactive Patient Education © 2018 [employer] °Electrical Cardioversion, Care After °This sheet gives you information about how to care for yourself after your procedure. Your health care provider may also give you more specific instructions. If you have problems or questions, contact your health care provider. °What can I expect after the procedure? °After the procedure, it is common to have: °· Some redness on the skin where the shocks were given. ° °Follow these instructions at home: °· Do not drive for 24 hours if you were given a medicine to help you relax (sedative). °· Take over-the-counter and prescription medicines only as told by your health care provider. °· Ask your health care provider how to check your pulse. Check it often. °· Rest for 48 hours after the procedure or as told by your health care provider. °· Avoid or limit your caffeine use as told by your health care provider. °Contact a health care provider if: °· You feel like your heart is beating too quickly or your pulse is not regular. °· You have a serious muscle cramp that does not go away. °Get help right away if: °· You have discomfort in your chest. °· You are dizzy or you feel faint. °· You have trouble breathing or you are short of breath. °· Your speech is slurred. °· You have trouble moving an arm or leg on   one side of your body. °· Your fingers or toes turn cold or blue. °This information is not intended to replace advice given to you by your health care provider. Make sure you discuss any questions you have with your health care provider. °Document Released: 03/31/2013 Document Revised: 01/12/2016 Document Reviewed: 12/15/2015 °[employer] Interactive Patient Education © 2018 [employer] ° °

## 2017-12-22 ENCOUNTER — Encounter (HOSPITAL_COMMUNITY): Payer: Self-pay | Admitting: Emergency Medicine

## 2017-12-22 ENCOUNTER — Emergency Department (HOSPITAL_COMMUNITY): Payer: Medicare Other

## 2017-12-22 ENCOUNTER — Emergency Department (HOSPITAL_COMMUNITY)
Admission: EM | Admit: 2017-12-22 | Discharge: 2017-12-22 | Disposition: A | Discharge status: Home / Self Care | Payer: Medicare Other | Source: Home / Self Care | Attending: Emergency Medicine | Admitting: Emergency Medicine

## 2017-12-22 ENCOUNTER — Telehealth: Payer: Self-pay

## 2017-12-22 DIAGNOSIS — I130 Hypertensive heart and chronic kidney disease with heart failure and stage 1 through stage 4 chronic kidney disease, or unspecified chronic kidney disease: Secondary | ICD-10-CM | POA: Diagnosis not present

## 2017-12-22 DIAGNOSIS — R0602 Shortness of breath: Secondary | ICD-10-CM

## 2017-12-22 DIAGNOSIS — N183 Chronic kidney disease, stage 3 (moderate): Secondary | ICD-10-CM | POA: Diagnosis not present

## 2017-12-22 DIAGNOSIS — Z79899 Other long term (current) drug therapy: Secondary | ICD-10-CM | POA: Insufficient documentation

## 2017-12-22 DIAGNOSIS — I10 Essential (primary) hypertension: Secondary | ICD-10-CM

## 2017-12-22 DIAGNOSIS — R079 Chest pain, unspecified: Secondary | ICD-10-CM | POA: Diagnosis not present

## 2017-12-22 DIAGNOSIS — I4891 Unspecified atrial fibrillation: Secondary | ICD-10-CM | POA: Diagnosis not present

## 2017-12-22 DIAGNOSIS — G4733 Obstructive sleep apnea (adult) (pediatric): Secondary | ICD-10-CM | POA: Diagnosis not present

## 2017-12-22 DIAGNOSIS — E871 Hypo-osmolality and hyponatremia: Secondary | ICD-10-CM | POA: Diagnosis not present

## 2017-12-22 DIAGNOSIS — I5032 Chronic diastolic (congestive) heart failure: Secondary | ICD-10-CM | POA: Diagnosis not present

## 2017-12-22 DIAGNOSIS — Z96651 Presence of right artificial knee joint: Secondary | ICD-10-CM | POA: Insufficient documentation

## 2017-12-22 DIAGNOSIS — Z7901 Long term (current) use of anticoagulants: Secondary | ICD-10-CM | POA: Insufficient documentation

## 2017-12-22 DIAGNOSIS — I481 Persistent atrial fibrillation: Secondary | ICD-10-CM | POA: Diagnosis not present

## 2017-12-22 LAB — CBC WITH DIFFERENTIAL/PLATELET
BASOS ABS: 0.1 10*3/uL (ref 0.0–0.1)
BASOS PCT: 1 %
Eosinophils Absolute: 0.3 10*3/uL (ref 0.0–0.7)
Eosinophils Relative: 4 %
HEMATOCRIT: 43.4 % (ref 36.0–46.0)
Hemoglobin: 14.7 g/dL (ref 12.0–15.0)
Lymphocytes Relative: 17 %
Lymphs Abs: 1.4 10*3/uL (ref 0.7–4.0)
MCH: 31.1 pg (ref 26.0–34.0)
MCHC: 33.9 g/dL (ref 30.0–36.0)
MCV: 91.9 fL (ref 78.0–100.0)
MONO ABS: 1 10*3/uL (ref 0.1–1.0)
MONOS PCT: 12 %
NEUTROS ABS: 5.3 10*3/uL (ref 1.7–7.7)
Neutrophils Relative %: 66 %
Platelets: 185 10*3/uL (ref 150–400)
RBC: 4.72 MIL/uL (ref 3.87–5.11)
RDW: 13.7 % (ref 11.5–15.5)
WBC: 8.1 10*3/uL (ref 4.0–10.5)

## 2017-12-22 LAB — BASIC METABOLIC PANEL
Anion gap: 8 (ref 5–15)
BUN: 17 mg/dL (ref 8–23)
CHLORIDE: 100 mmol/L (ref 98–111)
CO2: 25 mmol/L (ref 22–32)
Calcium: 9 mg/dL (ref 8.9–10.3)
Creatinine, Ser: 1.03 mg/dL — ABNORMAL HIGH (ref 0.44–1.00)
GFR calc Af Amer: 58 mL/min — ABNORMAL LOW (ref >60)
GFR, EST NON AFRICAN AMERICAN: 50 mL/min — AB (ref >60)
GLUCOSE: 118 mg/dL — AB (ref 70–99)
POTASSIUM: 3.9 mmol/L (ref 3.5–5.1)
Sodium: 133 mmol/L — ABNORMAL LOW (ref 135–145)

## 2017-12-22 LAB — TROPONIN I: Troponin I: <0.03 ng/mL (ref <0.03)

## 2017-12-22 LAB — MAGNESIUM: Magnesium: 1.9 mg/dL (ref 1.7–2.4)

## 2017-12-22 LAB — BRAIN NATRIURETIC PEPTIDE: B NATRIURETIC PEPTIDE 5: 1367 pg/mL — AB (ref 0.0–100.0)

## 2017-12-22 MED ORDER — PROPOFOL 10 MG/ML IV BOLUS
90.0000 mg | Freq: Once | INTRAVENOUS | Status: AC
  Administered 2017-12-22: 90 mg via INTRAVENOUS
  Filled 2017-12-22: qty 20

## 2017-12-22 MED ORDER — AMIODARONE HCL 150 MG/3ML IV SOLN
150.0000 mg | Freq: Once | INTRAVENOUS | Status: AC
  Administered 2017-12-22: 150 mg via INTRAVENOUS
  Filled 2017-12-22: qty 3

## 2017-12-22 MED ORDER — METOPROLOL TARTRATE 5 MG/5ML IV SOLN: 2.5000 mg | Freq: Once | INTRAVENOUS | Status: DC

## 2017-12-22 MED ORDER — AMIODARONE HCL 200 MG PO TABS: 200.0000 mg | ORAL_TABLET | Freq: Every day | ORAL | 0 refills | Status: DC

## 2017-12-22 MED ORDER — ATROPINE SULFATE 1 MG/ML IJ SOLN
INTRAMUSCULAR | Status: AC | PRN
  Administered 2017-12-22 (×2): .5 mg via INTRAVENOUS

## 2017-12-22 NOTE — ED Provider Notes (Signed)
[facility] EMERGENCY DEPARTMENT Provider Note   CSN: [id number] Arrival date & time: 12/22/17  3818     History   Chief Complaint Chief Complaint  Patient presents with  . Chest Pain    HPI Lydia Graham is a 82 y.o. female.  She has been troubled by atrial fibrillation for the last 2 months.  Her cardiologist Dr. Bronson Ing has been trying to rate control her but she is been very symptomatic either from the A. fib or from side effects of the medicines.  She underwent an elective cardioversion on Friday with Dr. Harl Bowie and did very well with that.  Unfortunately since last night she felt like she stepped back into atrial fibrillation with a rapid pulse with associated nausea shortness of breath pain in between her shoulder blades and feeling weak all over.  These are the same symptoms that she was experiencing when she was in atrial fibrillation.  She says the only medication they changed after her cardioversion was took away 1 of her metoprolol tablets at noon but she still on 100 twice daily.  She is also continued her anticoagulation.  She cannot think of another trigger that set it off.  The history is provided by the patient.  Chest Pain   This is a recurrent problem. The current episode started yesterday. The problem occurs constantly. The problem has not changed since onset.The pain is present in the substernal region. The pain is moderate. The quality of the pain is described as pressure-like. The pain radiates to the upper back. Associated symptoms include back pain, diaphoresis (1), irregular heartbeat, malaise/fatigue, nausea, palpitations and shortness of breath. Pertinent negatives include no abdominal pain, no cough, no fever, no headaches, no leg pain, no sputum production, no syncope and no vomiting. She has tried nothing for the symptoms. The treatment provided no relief.  Her past medical history is significant for arrhythmia.  Pertinent negatives for past medical  history include no seizures.    Past Medical History:  Diagnosis Date  . Arthritis    oa, all over - multiple areas   . Dysrhythmia    AFib   . GERD (gastroesophageal reflux disease)   . Headache    h/o migraines   . Hypertension     Patient Active Problem List   Diagnosis Date Noted  . Menopausal symptom 10/17/2017  . Menopausal syndrome 10/17/2017  . Carotid stenosis 03/27/2017  . S/P total knee arthroplasty, right 10/23/2016  . Unilateral primary osteoarthritis, right knee 07/13/2016    Past Surgical History:  Procedure Laterality Date  . ABDOMINAL HYSTERECTOMY    . APPENDECTOMY    . BACK SURGERY    . ENDARTERECTOMY Left 03/27/2017   Procedure: ENDARTERECTOMY CAROTID LEFT;  Surgeon: [name], MD;  Location: [place];  Service: Vascular;  Laterality: Left;  . INNER EAR SURGERY Right    puntured eardrum- repaired 2x's   . NASAL SINUS SURGERY     deviated septum  . PATCH ANGIOPLASTY Left 03/27/2017   Procedure: PATCH ANGIOPLASTY LEFT CAROTID ARTERY USING Rueben Bash BIOLOGIC PATCH;  Surgeon: [name], MD;  Location: [place];  Service: Vascular;  Laterality: Left;  . TONSILLECTOMY    . TOTAL KNEE ARTHROPLASTY Right 10/23/2016   Procedure: RIGHT TOTAL KNEE ARTHROPLASTY;  Surgeon: [name], MD;  Location: [place];  Service: Orthopedics;  Laterality: Right;     OB History   None      Home Medications    Prior to Admission  medications   Medication Sig Start Date End Date Taking? Authorizing Provider  amLODipine (NORVASC) 5 MG tablet Take 1 tablet (5 mg total) by mouth daily with lunch. 12/12/17   Barrett, Evelene Croon, PA-C  apixaban (ELIQUIS) 5 MG TABS tablet Take 1 tablet (5 mg total) by mouth 2 (two) times daily. 10/15/17   Herminio Commons, MD  butalbital-acetaminophen-caffeine (FIORICET, ESGIC) [phone] MG tablet Take 1 tablet by mouth every 6 (six) hours as needed for headache or migraine.  07/02/16   [provider]  Calcium  Carbonate-Vitamin D (CALCIUM 600+D PO) Take 1 tablet by mouth daily.    [provider]  cetirizine (ZYRTEC) 10 MG tablet Take 10 mg by mouth daily as needed for allergies.    [provider]  estradiol (ESTRACE) 0.5 MG tablet Take 0.5 mg by mouth daily.  04/18/16   [provider]  fluticasone (FLONASE) 50 MCG/ACT nasal spray Place 1 spray into both nostrils daily as needed for allergies.     [provider]  furosemide (LASIX) 40 MG tablet Take 1 tablet (40 mg total) by mouth 2 (two) times daily. For 3 days, then resume daily Patient taking differently: Take 40 mg by mouth daily.  12/12/17   Barrett, Evelene Croon, PA-C  losartan (COZAAR) 50 MG tablet Take 50 mg by mouth daily with lunch.    [provider]  magnesium hydroxide (MILK OF MAGNESIA) 400 MG/5ML suspension Take 30 mLs by mouth daily as needed for mild constipation.    [provider]  metoprolol succinate (TOPROL XL) 100 MG 24 hr tablet Take 1 tablet (100 mg total) by mouth 2 (two) times daily. Take with or immediately following a meal. 11/19/17   Herminio Commons, MD  pantoprazole (PROTONIX) 40 MG tablet Take 1 tablet (40 mg total) by mouth 2 (two) times daily. 12/12/17   Barrett, Evelene Croon, PA-C  potassium chloride SA (K-DUR,KLOR-CON) 20 MEQ tablet Take 1 tablet (20 mEq total) by mouth 2 (two) times daily. For 3 days, then resume daily Patient taking differently: Take 20 mEq by mouth daily.  12/12/17   Barrett, Evelene Croon, PA-C  vitamin B-12 (CYANOCOBALAMIN) 1000 MCG tablet Take 1,000 mcg by mouth daily.    [provider]    Family History Family History  Problem Relation Age of Onset  . Stroke Mother     Social History Social History   Tobacco Use  . Smoking status: Never Smoker  . Smokeless tobacco: Never Used  Substance Use Topics  . Alcohol use: No  . Drug use: No     Allergies   Rosuvastatin and Other   Review of Systems Review of Systems    Constitutional: Positive for diaphoresis (1) and malaise/fatigue. Negative for chills and fever.  HENT: Negative for ear pain and sore throat.   Eyes: Negative for pain and visual disturbance.  Respiratory: Positive for shortness of breath. Negative for cough and sputum production.   Cardiovascular: Positive for chest pain and palpitations. Negative for syncope.  Gastrointestinal: Positive for nausea. Negative for abdominal pain and vomiting.  Genitourinary: Negative for dysuria and hematuria.  Musculoskeletal: Positive for back pain. Negative for arthralgias.  Skin: Negative for color change and rash.  Neurological: Negative for seizures, syncope and headaches.  All other systems reviewed and are negative.    Physical Exam Updated Vital Signs BP (!) 148/104 (BP Location: Left Arm)   Pulse (!) 135   Temp 97.8 F (36.6 C) (Oral)   Resp Marland Kitchen)  24   Ht 5\' 7"  (1.702 m)   Wt 70.3 kg (155 lb)   LMP  (LMP Unknown)   SpO2 96%   BMI 24.28 kg/m   Physical Exam  Constitutional: She appears well-developed and well-nourished. No distress.  HENT:  Head: Normocephalic and atraumatic.  Eyes: Conjunctivae are normal.  Neck: Neck supple.  Cardiovascular: Intact distal pulses and normal pulses. An irregularly irregular rhythm present. Tachycardia present.  No murmur heard. Pulmonary/Chest: Effort normal and breath sounds normal. No respiratory distress.  Abdominal: Soft. There is no tenderness.  Musculoskeletal: She exhibits no edema or deformity.  Neurological: She is alert.  Skin: Skin is warm and dry. Capillary refill takes less than 2 seconds.  Psychiatric: She has a normal mood and affect.  Nursing note and vitals reviewed.    ED Treatments / Results  Labs (all labs ordered are listed, but only abnormal results are displayed) Labs Reviewed  BASIC METABOLIC PANEL - Abnormal; Notable for the following components:      Result Value   Sodium 133 (*)    Glucose, Bld 118 (*)     Creatinine, Ser 1.03 (*)    GFR calc non Af Amer 50 (*)    GFR calc Af Amer 58 (*)    All other components within normal limits  BRAIN NATRIURETIC PEPTIDE - Abnormal; Notable for the following components:   B Natriuretic Peptide 1,367.0 (*)    All other components within normal limits  TROPONIN I  CBC WITH DIFFERENTIAL/PLATELET  MAGNESIUM    EKG EKG Interpretation  Date/Time:  Monday December 22 2017 09:54:45 EDT Ventricular Rate:  36 PR Interval:    QRS Duration: 122 QT Interval:  510 QTC Calculation: 395 R Axis:   -65 Text Interpretation:  Sinus bradycardia Left bundle branch block after cardioversion Confirmed by Aletta Edouard [id number]) on 12/22/2017 9:59:21 AM   Radiology Dg Chest Port 1 View  Result Date: 12/22/2017 CLINICAL DATA:  Chest pain and shortness of breath EXAM: PORTABLE CHEST 1 VIEW COMPARISON:  Nov 18, 2017 FINDINGS: There is atelectatic change in the left mid lung and left base regions. There is no edema or consolidation. Heart is mildly enlarged with pulmonary vascularity normal. No adenopathy. There is aortic atherosclerosis. No adenopathy. No bone lesions. IMPRESSION: Areas of atelectatic change on the left. No edema or consolidation. Stable cardiac prominence. There is aortic atherosclerosis. Aortic Atherosclerosis (ICD10-I70.0). Electronically Signed   By: Lowella Grip III M.D.   On: 12/22/2017 07:52    Procedures .Cardioversion Date/Time: 12/22/2017 9:47 AM Performed by: Hayden Rasmussen, MD Authorized by: Hayden Rasmussen, MD   Consent:    Consent obtained:  Written   Consent given by:  Patient   Risks discussed:  Cutaneous burn, death, induced arrhythmia and pain   Alternatives discussed:  Rate-control medication, delayed treatment and no treatment Pre-procedure details:    Cardioversion basis:  Elective   Rhythm:  Atrial fibrillation   Electrode placement:  Anterior-posterior Patient sedated: Yes. Refer to sedation procedure documentation for  details of sedation.  Attempt one:    Cardioversion mode:  Synchronous   Shock (Joules):  200   Shock outcome:  Conversion to normal sinus rhythm Post-procedure details:    Patient status:  Awake   Patient tolerance of procedure:  Tolerated well, no immediate complications Comments:     Loletha Grayer in high 30s, given atropine   .Sedation Date/Time: 12/22/2017 9:48 AM Performed by: Hayden Rasmussen, MD Authorized by: Hayden Rasmussen, MD  Consent:    Consent obtained:  Written   Consent given by:  Patient   Risks discussed:  Prolonged hypoxia resulting in organ damage, prolonged sedation necessitating reversal, respiratory compromise necessitating ventilatory assistance and intubation, nausea, inadequate sedation, dysrhythmia and allergic reaction   Alternatives discussed:  Analgesia without sedation Universal protocol:    Procedure explained and questions answered to patient or proxy's satisfaction: yes     Relevant documents present and verified: yes     Test results available and properly labeled: yes     Imaging studies available: yes     Immediately prior to procedure a time out was called: yes     Patient identity confirmation method:  Verbally with patient and arm band Indications:    Procedure performed:  Cardioversion   Procedure necessitating sedation performed by:  Physician performing sedation   Intended level of sedation:  Deep Pre-sedation assessment:    Time since last food or drink:  Last night   ASA classification: class 3 - patient with severe systemic disease     Neck mobility: normal     Mouth opening:  3 or more finger widths   Thyromental distance:  3 finger widths   Mallampati score:  II - soft palate, uvula, fauces visible   Pre-sedation assessments completed and reviewed: airway patency, cardiovascular function, hydration status, mental status, nausea/vomiting, pain level, respiratory function and temperature   Immediate pre-procedure details:     Reviewed: vital signs     Verified: bag valve mask available, emergency equipment available, intubation equipment available, IV patency confirmed, oxygen available, reversal medications available and suction available   Procedure details (see MAR for exact dosages):    Preoxygenation:  Nasal cannula   Sedation:  Propofol   Intra-procedure monitoring:  Blood pressure monitoring, continuous capnometry, frequent LOC assessments, frequent vital sign checks, continuous pulse oximetry and cardiac monitor   Intra-procedure events: none     Intra-procedure management:  Airway repositioning   Total Provider sedation time (minutes):  20 Post-procedure details:    Attendance: Constant attendance by certified staff until patient recovered     Recovery: Patient returned to pre-procedure baseline     Post-sedation assessments completed and reviewed: airway patency, cardiovascular function, hydration status, mental status, nausea/vomiting, pain level, respiratory function and temperature   .Critical Care Performed by: Hayden Rasmussen, MD Authorized by: Hayden Rasmussen, MD   Critical care provider statement:    Critical care time (minutes):  30   Critical care time was exclusive of:  Separately billable procedures and treating other patients   Critical care was necessary to treat or prevent imminent or life-threatening deterioration of the following conditions:  Circulatory failure   Critical care was time spent personally by me on the following activities:  Discussions with consultants, development of treatment plan with patient or surrogate, evaluation of patient's response to treatment, examination of patient, obtaining history from patient or surrogate, ordering and performing treatments and interventions, ordering and review of laboratory studies, ordering and review of radiographic studies, pulse oximetry, re-evaluation of patient's condition and review of old charts   I assumed direction of critical  care for this patient from another provider in my specialty: no     (including critical care time)  Medications Ordered in ED Medications  amiodarone (CORDARONE) injection 150 mg (150 mg Intravenous Given 12/22/17 0835)  propofol (DIPRIVAN) 10 mg/mL bolus/IV push 90 mg (90 mg Intravenous Given 12/22/17 0931)  atropine injection (0.5 mg Intravenous Given 12/22/17 0944)  Initial Impression / Assessment and Plan / ED Course  I have reviewed the triage vital signs and the nursing notes.  Pertinent labs & imaging results that were available during my care of the patient were reviewed by me and considered in my medical decision making (see chart for details).  Clinical Course as of Dec 24 1039  Mon Dec 23, 6238  5210 82 year old female with recent history of A. fib now status post cardioversion on Friday back in A. fib apparently.  She is moderately symptomatic although appears in no distress.  Tachycardic to 135 here so aborted some IV Lopressor because she has not taken any of her medicines today.  We will keep her n.p.o. and discuss with cardiology although I am not really sure that repeat cardioverting her would benefit her since she is already tipped back in A. fib so soon.   [MB]  [phone] Discussed with Dr. Bronson Ing who will evaluate the patient's chart and get back to me with a plan.   [MB]  [id number] Dr. Bronson Ing.  He recommends giving the patient 150 mg of amiodarone bolus and then in about an hour cardiovert the patient.  Patient has been n.p.o. since last evening and she is agreeable to plan.   [MB]  [id number] Patient received the amiodarone and her rates that dipped into the 90s but still appear in A. fib.   [MB]  0272 After cardioversion patient went into a sinus bradycardia in the high 30s.  Blood pressure 110s.  Gave her atropine x2 with no real significant changes.  I reviewed this with Dr. Bronson Ing but as we are talking her heart rates now up in the 77s.  We are hoping the bradycardia was  fairly short-lived his plan is for continued monitoring here and if she remains with a good heart rate and blood pressure she can be discharged on amiodarone 200 mg daily starting tomorrow and outpatient follow-up with them.   [MB]  1225 Patient is reason weaned in normal sinus rhythm with a rate of 60 and blood pressures back up to 150s over 80s or 90s.  She feels back to normal would like to be discharged.  I will prescribe her the amiodarone per Dr. Court Joy recommendations and she understands to follow-up with her doctors soon.   [MB]    Clinical Course User Index [MB] Hayden Rasmussen, MD      Final Clinical Impressions(s) / ED Diagnoses   Final diagnoses:  Rapid atrial fibrillation [facility])    ED Discharge Orders        Ordered    amiodarone (PACERONE) 200 MG tablet  Daily     12/22/17 1227       Hayden Rasmussen, MD 12/23/17 1043

## 2017-12-22 NOTE — Sedation Documentation (Signed)
Pulse easily palpable.  Continues to be bradycardic after Atropine.

## 2017-12-22 NOTE — Sedation Documentation (Signed)
Atropine repeated

## 2017-12-22 NOTE — Discharge Instructions (Addendum)
Your evaluated in the hospital for a recurrence of your atrial fibrillation.  After discussion with your cardiologist you were cardioverted and are now in sinus rhythm.  He wants you to take amiodarone 1 tablet a day starting tomorrow.  You are  to continue your regular medicines otherwise.  Please keep a record of your heart rate and blood pressures and call Dr. Bronson Ing for close follow-up.  Return if any problems.

## 2017-12-22 NOTE — Telephone Encounter (Signed)
Ref placed to Dr Curt Bears for EP eval. LMTCB with pt.Pt currently in the [facility] ED

## 2017-12-22 NOTE — ED Notes (Signed)
Gave patient crackers and ice water as requested and approved by MD.

## 2017-12-22 NOTE — Sedation Documentation (Signed)
Synchronized shock delivered at 200J. Successful cardioversion to SR from Afib with RVR.

## 2017-12-22 NOTE — Sedation Documentation (Signed)
Pt becoming bradycardic.  Airway patent and CO2/O2 stable.

## 2017-12-22 NOTE — Sedation Documentation (Signed)
Pt beginning to arouse.  Is able to answer questions and asked if cardioversion was successful.

## 2017-12-22 NOTE — ED Notes (Signed)
Pt has returned to baseline and HR has improved.  Care returned to primary RN

## 2017-12-22 NOTE — ED Triage Notes (Signed)
Pt states last night she was sweating and not feeling well and feeling like heart was racing.  Had cardioversion on Friday for afib and this morning the symptoms started again with sweating and intermittent pain.

## 2017-12-22 NOTE — Sedation Documentation (Signed)
Pt very drowsy.  Slight jaw thrust required to keep airway open.  VSS.  Physician at bedside monitoring.

## 2017-12-23 ENCOUNTER — Inpatient Hospital Stay (HOSPITAL_COMMUNITY)
Admission: EM | Admit: 2017-12-23 | Discharge: 2017-12-26 | DRG: 309 | Disposition: A | Discharge status: Home / Self Care | Payer: Medicare Other | Attending: Cardiology | Admitting: Cardiology

## 2017-12-23 ENCOUNTER — Encounter (HOSPITAL_COMMUNITY): Payer: Self-pay

## 2017-12-23 ENCOUNTER — Other Ambulatory Visit: Payer: Self-pay

## 2017-12-23 ENCOUNTER — Telehealth: Payer: Self-pay | Admitting: Cardiovascular Disease

## 2017-12-23 ENCOUNTER — Emergency Department (HOSPITAL_COMMUNITY): Payer: Medicare Other

## 2017-12-23 DIAGNOSIS — Z7901 Long term (current) use of anticoagulants: Secondary | ICD-10-CM

## 2017-12-23 DIAGNOSIS — Z79890 Hormone replacement therapy (postmenopausal): Secondary | ICD-10-CM

## 2017-12-23 DIAGNOSIS — I959 Hypotension, unspecified: Secondary | ICD-10-CM | POA: Diagnosis present

## 2017-12-23 DIAGNOSIS — N183 Chronic kidney disease, stage 3 (moderate): Secondary | ICD-10-CM | POA: Diagnosis not present

## 2017-12-23 DIAGNOSIS — I10 Essential (primary) hypertension: Secondary | ICD-10-CM | POA: Diagnosis not present

## 2017-12-23 DIAGNOSIS — I4891 Unspecified atrial fibrillation: Secondary | ICD-10-CM | POA: Diagnosis not present

## 2017-12-23 DIAGNOSIS — I130 Hypertensive heart and chronic kidney disease with heart failure and stage 1 through stage 4 chronic kidney disease, or unspecified chronic kidney disease: Secondary | ICD-10-CM | POA: Diagnosis present

## 2017-12-23 DIAGNOSIS — I5043 Acute on chronic combined systolic (congestive) and diastolic (congestive) heart failure: Secondary | ICD-10-CM | POA: Diagnosis not present

## 2017-12-23 DIAGNOSIS — K219 Gastro-esophageal reflux disease without esophagitis: Secondary | ICD-10-CM | POA: Diagnosis present

## 2017-12-23 DIAGNOSIS — E871 Hypo-osmolality and hyponatremia: Secondary | ICD-10-CM | POA: Diagnosis not present

## 2017-12-23 DIAGNOSIS — I481 Persistent atrial fibrillation: Principal | ICD-10-CM | POA: Diagnosis present

## 2017-12-23 DIAGNOSIS — G4733 Obstructive sleep apnea (adult) (pediatric): Secondary | ICD-10-CM | POA: Diagnosis present

## 2017-12-23 DIAGNOSIS — Z96651 Presence of right artificial knee joint: Secondary | ICD-10-CM | POA: Diagnosis present

## 2017-12-23 DIAGNOSIS — I5032 Chronic diastolic (congestive) heart failure: Secondary | ICD-10-CM | POA: Diagnosis present

## 2017-12-23 DIAGNOSIS — R001 Bradycardia, unspecified: Secondary | ICD-10-CM | POA: Diagnosis present

## 2017-12-23 DIAGNOSIS — Z888 Allergy status to other drugs, medicaments and biological substances status: Secondary | ICD-10-CM

## 2017-12-23 DIAGNOSIS — I739 Peripheral vascular disease, unspecified: Secondary | ICD-10-CM | POA: Diagnosis present

## 2017-12-23 DIAGNOSIS — R0602 Shortness of breath: Secondary | ICD-10-CM | POA: Diagnosis not present

## 2017-12-23 LAB — CBC
HEMATOCRIT: 48.1 % — AB (ref 36.0–46.0)
Hemoglobin: 15.8 g/dL — ABNORMAL HIGH (ref 12.0–15.0)
MCH: 30.6 pg (ref 26.0–34.0)
MCHC: 32.8 g/dL (ref 30.0–36.0)
MCV: 93 fL (ref 78.0–100.0)
Platelets: 235 10*3/uL (ref 150–400)
RBC: 5.17 MIL/uL — ABNORMAL HIGH (ref 3.87–5.11)
RDW: 13.4 % (ref 11.5–15.5)
WBC: 10.2 10*3/uL (ref 4.0–10.5)

## 2017-12-23 LAB — BASIC METABOLIC PANEL
Anion gap: 11 (ref 5–15)
BUN: 15 mg/dL (ref 8–23)
CHLORIDE: 98 mmol/L (ref 98–111)
CO2: 24 mmol/L (ref 22–32)
Calcium: 9.4 mg/dL (ref 8.9–10.3)
Creatinine, Ser: 1.19 mg/dL — ABNORMAL HIGH (ref 0.44–1.00)
GFR calc Af Amer: 49 mL/min — ABNORMAL LOW (ref >60)
GFR calc non Af Amer: 42 mL/min — ABNORMAL LOW (ref >60)
GLUCOSE: 101 mg/dL — AB (ref 70–99)
Potassium: 4.6 mmol/L (ref 3.5–5.1)
Sodium: 133 mmol/L — ABNORMAL LOW (ref 135–145)

## 2017-12-23 LAB — I-STAT TROPONIN, ED: Troponin i, poc: 0 ng/mL (ref 0.00–0.08)

## 2017-12-23 MED ORDER — FUROSEMIDE 10 MG/ML IJ SOLN
40.0000 mg | Freq: Two times a day (BID) | INTRAMUSCULAR | Status: AC
  Administered 2017-12-24 – 2017-12-25 (×3): 40 mg via INTRAVENOUS
  Filled 2017-12-23 (×3): qty 4

## 2017-12-23 MED ORDER — FUROSEMIDE 40 MG PO TABS: 40.0000 mg | ORAL_TABLET | Freq: Every day | ORAL | Status: DC

## 2017-12-23 MED ORDER — ESTRADIOL 1 MG PO TABS
0.5000 mg | ORAL_TABLET | Freq: Every day | ORAL | Status: DC
  Administered 2017-12-24 – 2017-12-26 (×3): 0.5 mg via ORAL
  Filled 2017-12-23 (×3): qty 0.5

## 2017-12-23 MED ORDER — PANTOPRAZOLE SODIUM 40 MG PO TBEC
40.0000 mg | DELAYED_RELEASE_TABLET | Freq: Two times a day (BID) | ORAL | Status: DC
  Administered 2017-12-23 – 2017-12-26 (×6): 40 mg via ORAL
  Filled 2017-12-23 (×6): qty 1

## 2017-12-23 MED ORDER — ACETAMINOPHEN 325 MG PO TABS
650.0000 mg | ORAL_TABLET | ORAL | Status: DC | PRN
  Administered 2017-12-25: 650 mg via ORAL
  Filled 2017-12-23: qty 2

## 2017-12-23 MED ORDER — AMIODARONE HCL IN DEXTROSE 360-4.14 MG/200ML-% IV SOLN
60.0000 mg/h | INTRAVENOUS | Status: AC
  Administered 2017-12-23 – 2017-12-24 (×2): 60 mg/h via INTRAVENOUS
  Filled 2017-12-23 (×3): qty 200

## 2017-12-23 MED ORDER — VITAMIN B-12 1000 MCG PO TABS
1000.0000 ug | ORAL_TABLET | Freq: Every day | ORAL | Status: DC
  Administered 2017-12-24 – 2017-12-26 (×3): 1000 ug via ORAL
  Filled 2017-12-23 (×3): qty 1

## 2017-12-23 MED ORDER — FLUTICASONE PROPIONATE 50 MCG/ACT NA SUSP: 1.0000 | Freq: Every day | NASAL | Status: DC | PRN

## 2017-12-23 MED ORDER — MAGNESIUM HYDROXIDE 400 MG/5ML PO SUSP
30.0000 mL | Freq: Every day | ORAL | Status: DC | PRN
  Administered 2017-12-25: 30 mL via ORAL
  Filled 2017-12-23: qty 30

## 2017-12-23 MED ORDER — AMLODIPINE BESYLATE 5 MG PO TABS
5.0000 mg | ORAL_TABLET | Freq: Every day | ORAL | Status: DC
  Administered 2017-12-24 – 2017-12-26 (×3): 5 mg via ORAL
  Filled 2017-12-23 (×4): qty 1

## 2017-12-23 MED ORDER — AMIODARONE HCL IN DEXTROSE 360-4.14 MG/200ML-% IV SOLN
30.0000 mg/h | INTRAVENOUS | Status: DC
  Administered 2017-12-24 – 2017-12-26 (×5): 30 mg/h via INTRAVENOUS
  Filled 2017-12-23 (×5): qty 200

## 2017-12-23 MED ORDER — LOSARTAN POTASSIUM 50 MG PO TABS
50.0000 mg | ORAL_TABLET | Freq: Every day | ORAL | Status: DC
  Administered 2017-12-24 – 2017-12-26 (×3): 50 mg via ORAL
  Filled 2017-12-23 (×3): qty 1

## 2017-12-23 MED ORDER — METOPROLOL SUCCINATE ER 100 MG PO TB24
100.0000 mg | ORAL_TABLET | Freq: Two times a day (BID) | ORAL | Status: DC
Start: 2017-12-23 — End: 2017-12-24
  Administered 2017-12-23 – 2017-12-24 (×2): 100 mg via ORAL
  Filled 2017-12-23 (×2): qty 1

## 2017-12-23 MED ORDER — AMIODARONE LOAD VIA INFUSION
150.0000 mg | Freq: Once | INTRAVENOUS | Status: AC
  Administered 2017-12-23: 150 mg via INTRAVENOUS
  Filled 2017-12-23: qty 83.34

## 2017-12-23 MED ORDER — APIXABAN 5 MG PO TABS
5.0000 mg | ORAL_TABLET | Freq: Two times a day (BID) | ORAL | Status: DC
  Administered 2017-12-23 – 2017-12-26 (×6): 5 mg via ORAL
  Filled 2017-12-23 (×6): qty 1

## 2017-12-23 MED ORDER — POTASSIUM CHLORIDE CRYS ER 20 MEQ PO TBCR
20.0000 meq | EXTENDED_RELEASE_TABLET | Freq: Every day | ORAL | Status: DC
  Administered 2017-12-24 – 2017-12-26 (×3): 20 meq via ORAL
  Filled 2017-12-23 (×3): qty 1

## 2017-12-23 MED ORDER — ONDANSETRON HCL 4 MG/2ML IJ SOLN: 4.0000 mg | Freq: Four times a day (QID) | INTRAMUSCULAR | Status: DC | PRN

## 2017-12-23 MED ORDER — HYDRALAZINE HCL 20 MG/ML IJ SOLN: 5.0000 mg | Freq: Four times a day (QID) | INTRAMUSCULAR | Status: DC | PRN

## 2017-12-23 NOTE — Telephone Encounter (Signed)
Patient has apt on Monday.Are there any changes you want with medications?

## 2017-12-23 NOTE — Telephone Encounter (Signed)
Patient states her HR is 130 and "going up",she feels bad she is going to cone to [facility] ED. I called [employer] office, was unable to reach nurse.    Dr.Koneswaran has messaged Dr.Camnitz already.

## 2017-12-23 NOTE — Telephone Encounter (Signed)
Increase amiodarone to 200 mg bid until she sees EP on Monday.

## 2017-12-23 NOTE — ED Triage Notes (Signed)
Pt presents with onset of afib that began again this afternoon.  Pt reports she was cardioverted  Yesterday and the day before that at [facility].

## 2017-12-23 NOTE — ED Provider Notes (Signed)
[facility] EMERGENCY DEPARTMENT Provider Note   CSN: [id number] Arrival date & time: 12/23/17  1643     History   Chief Complaint No chief complaint on file.   HPI Lydia Graham is a 82 y.o. female.  Patient returns today with recurrent atrial fibrillation with rapid heart rate.  Restarted today at about 3 PM.  Patient underwent cardioversion by Dr. Harl Bowie from cardiology on Friday.  Was kept on her beta-blocker with some adjustment down in that dosage.  Patient returned to [facility] emergency department yesterday in rapid atrial fib that she stated started around 8 PM on Sunday evening.  Patient was cardioverted again and started on amiodarone.  Patient is on Eliquis.  Patient supposed to be taking 1 tablet 200 mg daily.  Patient contacted her cardiologist today according to her she was told to come in.  Patient with some mild shortness of breath no real chest pain.  Following cardioversion yesterday patient became bradycardic and hypotensive.  Then recovered without direct intervention.     Past Medical History:  Diagnosis Date  . Arthritis    oa, all over - multiple areas   . Dysrhythmia    AFib   . GERD (gastroesophageal reflux disease)   . Headache    h/o migraines   . Hypertension     Patient Active Problem List   Diagnosis Date Noted  . Menopausal symptom 10/17/2017  . Menopausal syndrome 10/17/2017  . Carotid stenosis 03/27/2017  . S/P total knee arthroplasty, right 10/23/2016  . Unilateral primary osteoarthritis, right knee 07/13/2016    Past Surgical History:  Procedure Laterality Date  . ABDOMINAL HYSTERECTOMY    . APPENDECTOMY    . BACK SURGERY    . CARDIOVERSION N/A 12/19/2017   Procedure: CARDIOVERSION;  Surgeon: Arnoldo Lenis, MD;  Location: [facility];  Service: Endoscopy;  Laterality: N/A;  . ENDARTERECTOMY Left 03/27/2017   Procedure: ENDARTERECTOMY CAROTID LEFT;  Surgeon: [name], MD;  Location: [place];  Service: Vascular;  Laterality: Left;  . INNER EAR SURGERY Right    puntured eardrum- repaired 2x's   . NASAL SINUS SURGERY     deviated septum  . PATCH ANGIOPLASTY Left 03/27/2017   Procedure: PATCH ANGIOPLASTY LEFT CAROTID ARTERY USING Rueben Bash BIOLOGIC PATCH;  Surgeon: [name], MD;  Location: [place];  Service: Vascular;  Laterality: Left;  . TONSILLECTOMY    . TOTAL KNEE ARTHROPLASTY Right 10/23/2016   Procedure: RIGHT TOTAL KNEE ARTHROPLASTY;  Surgeon: [name], MD;  Location: [place];  Service: Orthopedics;  Laterality: Right;     OB History   None      Home Medications    Prior to Admission medications   Medication Sig Start Date End Date Taking? Authorizing Provider  amiodarone (PACERONE) 200 MG tablet Take 1 tablet (200 mg total) by mouth daily. 12/22/17   Hayden Rasmussen, MD  amLODipine (NORVASC) 5 MG tablet Take 1 tablet (5 mg total) by mouth daily with lunch. 12/12/17   Barrett, Evelene Croon, PA-C  apixaban (ELIQUIS) 5 MG TABS tablet Take 1 tablet (5 mg total) by mouth 2 (two) times daily. 10/15/17   Herminio Commons, MD  Calcium Carbonate-Vitamin D (CALCIUM 600+D PO) Take 1 tablet by mouth daily.    [provider]  estradiol (ESTRACE) 0.5 MG tablet Take 0.5 mg by mouth daily.  04/18/16   [provider]  fluticasone (FLONASE) 50 MCG/ACT nasal spray Place 1 spray  into both nostrils daily as needed for allergies.     [provider]  furosemide (LASIX) 40 MG tablet Take 1 tablet (40 mg total) by mouth 2 (two) times daily. For 3 days, then resume daily Patient taking differently: Take 40 mg by mouth daily.  12/12/17   Barrett, Evelene Croon, PA-C  losartan (COZAAR) 50 MG tablet Take 50 mg by mouth daily with lunch.    [provider]  magnesium hydroxide (MILK OF MAGNESIA) 400 MG/5ML suspension Take 30 mLs by mouth daily as needed for mild constipation.    [provider]  metoprolol succinate (TOPROL XL) 100 MG 24  hr tablet Take 1 tablet (100 mg total) by mouth 2 (two) times daily. Take with or immediately following a meal. 11/19/17   Herminio Commons, MD  pantoprazole (PROTONIX) 40 MG tablet Take 1 tablet (40 mg total) by mouth 2 (two) times daily. 12/12/17   Barrett, Evelene Croon, PA-C  potassium chloride SA (K-DUR,KLOR-CON) 20 MEQ tablet Take 1 tablet (20 mEq total) by mouth 2 (two) times daily. For 3 days, then resume daily Patient taking differently: Take 20 mEq by mouth daily.  12/12/17   Barrett, Evelene Croon, PA-C  vitamin B-12 (CYANOCOBALAMIN) 1000 MCG tablet Take 1,000 mcg by mouth daily.    [provider]    Family History Family History  Problem Relation Age of Onset  . Stroke Mother     Social History Social History   Tobacco Use  . Smoking status: Never Smoker  . Smokeless tobacco: Never Used  Substance Use Topics  . Alcohol use: No  . Drug use: No     Allergies   Rosuvastatin and Other   Review of Systems Review of Systems  Constitutional: Negative for fever.  HENT: Negative for congestion.   Eyes: Negative for redness.  Respiratory: Positive for shortness of breath.   Cardiovascular: Positive for chest pain and palpitations.  Gastrointestinal: Negative for abdominal pain.  Genitourinary: Negative for dysuria.  Musculoskeletal: Negative for myalgias.  Skin: Negative for rash.  Neurological: Negative for syncope.  Hematological: Does not bruise/bleed easily.  Psychiatric/Behavioral: Negative for confusion.     Physical Exam Updated Vital Signs BP (!) 140/100   Pulse (!) 36   Temp 98 F (36.7 C)   Resp 19   LMP  (LMP Unknown)   SpO2 94%   Physical Exam  Constitutional: She is oriented to person, place, and time. She appears well-developed and well-nourished. No distress.  HENT:  Head: Normocephalic and atraumatic.  Mouth/Throat: Oropharynx is clear and moist.  Eyes: Pupils are equal, round, and reactive to light. Conjunctivae and EOM are normal.    Neck: Neck supple.  Cardiovascular:  Rapid irregular rate.  Pulmonary/Chest: Effort normal and breath sounds normal. No respiratory distress.  Abdominal: Soft. Bowel sounds are normal. There is no tenderness.  Neurological: She is alert and oriented to person, place, and time. No cranial nerve deficit or sensory deficit. She exhibits normal muscle tone. Coordination normal.  Skin: Skin is warm. No rash noted.  Nursing note and vitals reviewed.    ED Treatments / Results  Labs (all labs ordered are listed, but only abnormal results are displayed) Labs Reviewed  BASIC METABOLIC PANEL - Abnormal; Notable for the following components:      Result Value   Sodium 133 (*)    Glucose, Bld 101 (*)    Creatinine, Ser 1.19 (*)    GFR calc non Af Amer 42 (*)  GFR calc Af Amer 49 (*)    All other components within normal limits  CBC - Abnormal; Notable for the following components:   RBC 5.17 (*)    Hemoglobin 15.8 (*)    HCT 48.1 (*)    All other components within normal limits  I-STAT TROPONIN, ED    EKG EKG Interpretation  Date/Time:  Tuesday December 23 2017 16:47:21 EDT Ventricular Rate:  140 PR Interval:    QRS Duration: 104 QT Interval:  274 QTC Calculation: 418 R Axis:   -52 Text Interpretation:  Atrial fibrillation with rapid ventricular response Left axis deviation Inferior infarct , age undetermined Anteroseptal infarct , age undetermined ST & T wave abnormality, consider lateral ischemia Abnormal ECG Confirmed by Fredia Sorrow [id number]) on 12/23/2017 6:57:50 PM   Radiology Dg Chest 2 View  Result Date: 12/23/2017 CLINICAL DATA:  Atrial fibrillation and shortness of breath. EXAM: CHEST - 2 VIEW COMPARISON:  12/22/2017 and prior exam FINDINGS: Cardiomegaly with trace bilateral pleural effusions and mild bibasilar atelectasis again noted. There is no evidence of focal airspace disease, pulmonary edema, suspicious pulmonary nodule/mass or pneumothorax. No acute bony  abnormalities are identified. IMPRESSION: Cardiomegaly with trace bilateral pleural effusions and mild bibasilar atelectasis again noted. Electronically Signed   By: Margarette Canada M.D.   On: 12/23/2017 18:18   Dg Chest Port 1 View  Result Date: 12/22/2017 CLINICAL DATA:  Chest pain and shortness of breath EXAM: PORTABLE CHEST 1 VIEW COMPARISON:  Nov 18, 2017 FINDINGS: There is atelectatic change in the left mid lung and left base regions. There is no edema or consolidation. Heart is mildly enlarged with pulmonary vascularity normal. No adenopathy. There is aortic atherosclerosis. No adenopathy. No bone lesions. IMPRESSION: Areas of atelectatic change on the left. No edema or consolidation. Stable cardiac prominence. There is aortic atherosclerosis. Aortic Atherosclerosis (ICD10-I70.0). Electronically Signed   By: Lowella Grip III M.D.   On: 12/22/2017 07:52    Procedures Procedures (including critical care time)  Medications Ordered in ED Medications  amiodarone (NEXTERONE) 1.8 mg/mL load via infusion 150 mg (has no administration in time range)    Followed by  amiodarone (NEXTERONE PREMIX) 360-4.14 MG/200ML-% (1.8 mg/mL) IV infusion (has no administration in time range)    Followed by  amiodarone (NEXTERONE PREMIX) 360-4.14 MG/200ML-% (1.8 mg/mL) IV infusion (has no administration in time range)     Initial Impression / Assessment and Plan / ED Course  I have reviewed the triage vital signs and the nursing notes.  Pertinent labs & imaging results that were available during my care of the patient were reviewed by me and considered in my medical decision making (see chart for details).     Patient with recurrent rapid atrial fibrillation.  Discussed with cardiology fellow.  Patient was cardioverted on Friday and just yesterday.  Re-cardioverting her does not seem to make sense at this time they concur.  They are planning to go ahead and do IV load of amiodarone.  Patient will be admitted  to get rate control taking care of.  There have been discussions recently of ablation.  That most likely will not happen tomorrow.  Also when patient was cardioverted yesterday she had significant spell of bradycardia and hypotension but no intervention was required she did recover.  Patient nontoxic no acute distress currently.  Patient's labs and chest x-ray and troponin here without any acute abnormalities.  Cardiac monitoring patient's heart rate is ranged from upper 90s all the way up to  150.  Average is been kind of 100-120.  Cardiology is going to put in the orders for the IV amiodarone.  Final Clinical Impressions(s) / ED Diagnoses   Final diagnoses:  Atrial fibrillation with rapid ventricular response [facility])    ED Discharge Orders    None       Fredia Sorrow, MD 12/23/17 [phone]

## 2017-12-23 NOTE — Telephone Encounter (Signed)
Patient states she was cardioverted in ED yesterday but is now back in A-Fib. Wants to know what she needs to do. / tg

## 2017-12-23 NOTE — H&P (Addendum)
Cardiology Admission History and Physical:   Patient ID: NECIE WILCOXSON; MRN: [id number]; DOB: 1935/12/05   Admission date: 12/23/2017  Primary Care Provider: Glenda Chroman, MD Primary Cardiologist: Kate Sable, MD  Primary Electrophysiologist:  None  Chief Complaint:  Atrial fibrillation  Patient Profile:   Lydia Graham is a 82 y.o. female with a history of HTN and atrial fibrillation s/p multiple cardioversions this week (6/28 and 7/1), who presents with complaints of being back in atrial fibrillation.   History of Present Illness:   Lydia Graham was initially diagnosed with atrial fibrillation 10/2017 outpatient and despite being on metoprolol XL BID, her rate proved difficult to control and she reported fatigue which was affecting her ADLs. She was recommended to undergo cardioversion, which occurred 12/19/17, with successful return to sinus bradycardia. Unfortunately on the evening of 6/30, she reported feeling her heart go back into atrial fibrillation. She presented to the ED 12/22/17 and again was successful cardioverted. She was discharged home and recommended to start amiodarone this morning.   She returns to the ED today with complaints of return to atrial fibrillation starting this afternoon. She reports feeling sudden onset palpitations shortly after eating lunch with associated nausea and mild diaphoresis. She notes some pressure in her back with onset of atrial fibrillation as well. She reports taking her first dose of amiodarone this morning as instructed and that she has not missed any doses of eliquis. She has also been experiencing LE edema for which she takes lasix 40mg  daily. She denies DOE but does report some intermittent SOB which she states occurs without rhyme or reasons.   ED course: tachycardic to the 140s, BP elevated to 140/100, otherwise VSS. Labs notable for Na 133, K 4.6, Cr 1.19, Hgb 15.8, PLT 235, Trop 0.00. CXR with trace b/l pleural effusions and  mild bibasilar atelectasis. EKG with atrial fibrillation with RVR, rate 140 with non-specific ST-T wave abnormalities. Cardiology asked to admit patient for further management of her atrial fibrillation.    Past Medical History:  Diagnosis Date  . Arthritis    oa, all over - multiple areas   . Dysrhythmia    AFib   . GERD (gastroesophageal reflux disease)   . Headache    h/o migraines   . Hypertension     Past Surgical History:  Procedure Laterality Date  . ABDOMINAL HYSTERECTOMY    . APPENDECTOMY    . BACK SURGERY    . CARDIOVERSION N/A 12/19/2017   Procedure: CARDIOVERSION;  Surgeon: Arnoldo Lenis, MD;  Location[facility];  Service: Endoscopy;  Laterality: N/A;  . ENDARTERECTOMY Left 03/27/2017   Procedure: ENDARTERECTOMY CAROTID LEFT;  Surgeon: [name], MD;  Location: [place];  Service: Vascular;  Laterality: Left;  . INNER EAR SURGERY Right    puntured eardrum- repaired 2x's   . NASAL SINUS SURGERY     deviated septum  . PATCH ANGIOPLASTY Left 03/27/2017   Procedure: PATCH ANGIOPLASTY LEFT CAROTID ARTERY USING Rueben Bash BIOLOGIC PATCH;  Surgeon: [name], MD;  Location: [place];  Service: Vascular;  Laterality: Left;  . TONSILLECTOMY    . TOTAL KNEE ARTHROPLASTY Right 10/23/2016   Procedure: RIGHT TOTAL KNEE ARTHROPLASTY;  Surgeon: [name], MD;  Location: [place];  Service: Orthopedics;  Laterality: Right;     Medications Prior to Admission: Prior to Admission medications   Medication Sig Start Date End Date Taking? Authorizing Provider  amiodarone (PACERONE) 200 MG tablet Take 1 tablet (200  mg total) by mouth daily. 12/22/17   Hayden Rasmussen, MD  amLODipine (NORVASC) 5 MG tablet Take 1 tablet (5 mg total) by mouth daily with lunch. 12/12/17   Barrett, Evelene Croon, PA-C  apixaban (ELIQUIS) 5 MG TABS tablet Take 1 tablet (5 mg total) by mouth 2 (two) times daily. 10/15/17   Herminio Commons, MD  Calcium Carbonate-Vitamin D (CALCIUM  600+D PO) Take 1 tablet by mouth daily.    [provider]  estradiol (ESTRACE) 0.5 MG tablet Take 0.5 mg by mouth daily.  04/18/16   [provider]  fluticasone (FLONASE) 50 MCG/ACT nasal spray Place 1 spray into both nostrils daily as needed for allergies.     [provider]  furosemide (LASIX) 40 MG tablet Take 1 tablet (40 mg total) by mouth 2 (two) times daily. For 3 days, then resume daily Patient taking differently: Take 40 mg by mouth daily.  12/12/17   Barrett, Evelene Croon, PA-C  losartan (COZAAR) 50 MG tablet Take 50 mg by mouth daily with lunch.    [provider]  magnesium hydroxide (MILK OF MAGNESIA) 400 MG/5ML suspension Take 30 mLs by mouth daily as needed for mild constipation.    [provider]  metoprolol succinate (TOPROL XL) 100 MG 24 hr tablet Take 1 tablet (100 mg total) by mouth 2 (two) times daily. Take with or immediately following a meal. 11/19/17   Herminio Commons, MD  pantoprazole (PROTONIX) 40 MG tablet Take 1 tablet (40 mg total) by mouth 2 (two) times daily. 12/12/17   Barrett, Evelene Croon, PA-C  potassium chloride SA (K-DUR,KLOR-CON) 20 MEQ tablet Take 1 tablet (20 mEq total) by mouth 2 (two) times daily. For 3 days, then resume daily Patient taking differently: Take 20 mEq by mouth daily.  12/12/17   Barrett, Evelene Croon, PA-C  vitamin B-12 (CYANOCOBALAMIN) 1000 MCG tablet Take 1,000 mcg by mouth daily.    [provider]     Allergies:    Allergies  Allergen Reactions  . Rosuvastatin Other (See Comments)    MYALGIA's Muscle pain  . Other     PAIN MEDICATIONS/ANESTHESIA  MADE BP DROP LOW     Social History:   Social History   Socioeconomic History  . Marital status: Single    Spouse name: Not on file  . Number of children: Not on file  . Years of education: Not on file  . Highest education level: Not on file  Occupational History  . Not on file  Social Needs  . Financial resource strain: Not on file   . Food insecurity:    Worry: Not on file    Inability: Not on file  . Transportation needs:    Medical: Not on file    Non-medical: Not on file  Tobacco Use  . Smoking status: Never Smoker  . Smokeless tobacco: Never Used  Substance and Sexual Activity  . Alcohol use: No  . Drug use: No  . Sexual activity: Not Currently    Birth control/protection: Surgical  Lifestyle  . Physical activity:    Days per week: Not on file    Minutes per session: Not on file  . Stress: Not on file  Relationships  . Social connections:    Talks on phone: Not on file    Gets together: Not on file    Attends religious service: Not on file    Active member of club or organization: Not on file  Attends meetings of clubs or organizations: Not on file    Relationship status: Not on file  . Intimate partner violence:    Fear of current or ex partner: Not on file    Emotionally abused: Not on file    Physically abused: Not on file    Forced sexual activity: Not on file  Other Topics Concern  . Not on file  Social History Narrative  . Not on file    Family History:   The patient's family history includes Stroke in her mother.    ROS:  Please see the history of present illness.  All other ROS reviewed and negative.     Physical Exam/Data:   Vitals:   12/23/17 1648 12/23/17 1733 12/23/17 1735 12/23/17 1745  BP: (!) 135/111 (!) 146/85  (!) 140/100  Pulse: (!) 148  (!) 137 (!) 36  Resp: 20 (!) 21 18 19   Temp: 98 F (36.7 C)     SpO2: 98%  98% 94%   No intake or output data in the 24 hours ending 12/23/17 1944 There were no vitals filed for this visit. There is no height or weight on file to calculate BMI.  General:  Well nourished, well developed, sitting upright in bed eating a sandwich in no acute distress HEENT: sclera anicteric Neck: no JVD Vascular: No carotid bruits; distal pulses 2+ bilaterally  Cardiac:  normal S1, S2; IRIR; no murmurs, gallops, or rubs Lungs:  clear to  auscultation bilaterally, no wheezing, rhonchi or rales  Abd: soft, nontender, no hepatomegaly  Ext: 1-2+ LE edema Musculoskeletal:  No deformities, BUE and BLE strength normal and equal Skin: warm and dry  Neuro:  CNs 2-12 intact, no focal abnormalities noted Psych:  Normal affect    EKG:  Atrial fibrillation with RVR, rate 140  Relevant CV Studies: Echocardiogram 10/2017: Study Conclusions  - Left ventricle: Systolic function was difficult to assess due to   rapid atrial fibrillation, but is probably in the low normal   range. LVEF approximately 50%. The cavity size was normal. Wall   thickness was normal. Diffuse hypokinesis. The study was not   technically sufficient to allow evaluation of LV diastolic   dysfunction due to atrial fibrillation. - Mitral valve: There was moderate regurgitation. - Left atrium: The atrium was moderately dilated. - Right ventricle: Systolic function was mildly reduced. - Right atrium: The atrium was mildly dilated. - Tricuspid valve: There was moderate regurgitation. - Pulmonary arteries: PA peak pressure: 35 mm Hg (S). - Systemic veins: IVC is dilated with normal respiratory variation.   Estimated CVP 8 mmHg. - Pericardium, extracardiac: A trivial pericardial effusion was   identified.  Impressions:  - The patient was tachycardic throughout the study.  Laboratory Data:  Chemistry Recent Labs  Lab 12/22/17 0810 12/23/17 1723  NA 133* 133*  K 3.9 4.6  CL 100 98  CO2 25 24  GLUCOSE 118* 101*  BUN 17 15  CREATININE 1.03* 1.19*  CALCIUM 9.0 9.4  GFRNONAA 50* 42*  GFRAA 58* 49*  ANIONGAP 8 11    No results for input(s): PROT, ALBUMIN, AST, ALT, ALKPHOS, BILITOT in the last 168 hours. Hematology Recent Labs  Lab 12/22/17 0810 12/23/17 1723  WBC 8.1 10.2  RBC 4.72 5.17*  HGB 14.7 15.8*  HCT 43.4 48.1*  MCV 91.9 93.0  MCH 31.1 30.6  MCHC 33.9 32.8  RDW 13.7 13.4  PLT 185 235   Cardiac Enzymes Recent Labs  Lab  12/22/17 0810  TROPONINI <0.03    Recent Labs  Lab 12/23/17 1722  TROPIPOC 0.00    BNP Recent Labs  Lab 12/22/17 0810  BNP 1,367.0*    DDimer No results for input(s): DDIMER in the last 168 hours.  Radiology/Studies:  Dg Chest 2 View  Result Date: 12/23/2017 CLINICAL DATA:  Atrial fibrillation and shortness of breath. EXAM: CHEST - 2 VIEW COMPARISON:  12/22/2017 and prior exam FINDINGS: Cardiomegaly with trace bilateral pleural effusions and mild bibasilar atelectasis again noted. There is no evidence of focal airspace disease, pulmonary edema, suspicious pulmonary nodule/mass or pneumothorax. No acute bony abnormalities are identified. IMPRESSION: Cardiomegaly with trace bilateral pleural effusions and mild bibasilar atelectasis again noted. Electronically Signed   By: Margarette Canada M.D.   On: 12/23/2017 18:18    Assessment and Plan:   1. Atrial fibrillation with RVR: patient has had multiple cardioversions in the past week and unfortunately has returned with Afib RVR for the 3rd time. She was recently started on amiodarone po for rhythm control and metoprolol XL 100mg  BID for rate control, however starting this afternoon she felt acute onset palpitations.  - Will start amiodarone gtt - Continue home metoprolol - Continue eliquis 5mg  BID for CHA2DS2-VASc Score of 4 (HTN, age >43, and female)   2. HTN: BP elevated on arrival to the ED. Hopeful this will improve with improvement in rate control - Continue home amlodipine, losartan, metoprolol, and lasix.  - May need to consider adjusting home regimen if BP is persistently elevated - PRN hydral for SBP >180 or DBP >110  3. Likely chronic diastolic CHF: Last echo 06/4429 with EF 50% but was unable to assess LV diastolic function due to tachycardia and atrial fibrillation. She has been experiencing SOB and LE edema intermittently. Suspect this could be driven by her Afib with RVR.  - Continue metoprolol, losartan, and lasix - Will apply  compression stockings  4. CKD stage 3: Cr 1.19 today, appears at baseline - Continue to monitor closely  Severity of Illness: The appropriate patient status for this patient is OBSERVATION. Observation status is judged to be reasonable and necessary in order to provide the required intensity of service to ensure the patient's safety. The patient's presenting symptoms, physical exam findings, and initial radiographic and laboratory data in the context of their medical condition is felt to place them at decreased risk for further clinical deterioration. Furthermore, it is anticipated that the patient will be medically stable for discharge from the hospital within 2 midnights of admission. The following factors support the patient status of observation.   " The patient's presenting symptoms include SOB, palpitations. " The physical exam findings include irregularly irregular heart sounds. " The initial radiographic and laboratory data are EKG with Afib RVR.     For questions or updates, please contact [place] Please consult [email or web address] for contact info under Cardiology/STEMI.   Signed, Abigail Butts, PA-C  12/23/2017 7:44 PM   The patient was seen, examined and discussed with Abigail Butts, PA-C  and I agree with the above.   82 y.o. female with a history of HTN and atrial fibrillation diagnosed with a-fin in May 2019, started on Eliquis and s/p two recent cardioversions on 6/28 and 7/1. She presents with complaints of being back in atrial fibrillation this afternoon. Initially diagnosed in 10/2017 as outpatient with RVR, difficult to control on metoprolol. She is bradycardic when in SR. At her baseline she is very active walking a mile daily, taking care  of her house and an older sister, completely independent.  She called DR Bronson Ing this am who started her on amiodarone PO however because of heart rates going up to 150' and associated with fatigue, nausea, diaphoresis and  pressure in her back, she came to the ER. She is on Eliquis for anticoagulation and compliant with all of her meds.  On arrival to the ED her HR is in 110-120 and up to 150 with any activity. Labs show normal Crea, mild chronic hyponatremia, normal K, Mg 1.9 yesterday, normal Hb, elevated BNP yesterday - 1367, negative troponin.  Physical exam shows no JVD, clear lungs, iRRR, no murmurs, mild B/L pitting LE edema.   Plan: - start amiodarone iv bolus and load - continue Eliquis - continue Toprol XL 100 mg po daily - switch lasix to 40 mg iv BID x 3 doses, then re-assess.  - consider inpatient EP consult, the patient might benefit from other antiarrhythmics, on amiodarone for now as recurrent a-fib with RVR and signs of CHF, echo shows LVEF 50% while in a-fib with RVR, left atrium is moderately dilated, moderate MR)   Lydia Dawley, MD 12/23/2017

## 2017-12-24 ENCOUNTER — Ambulatory Visit: Payer: Medicare Other | Admitting: Cardiovascular Disease

## 2017-12-24 DIAGNOSIS — Z888 Allergy status to other drugs, medicaments and biological substances status: Secondary | ICD-10-CM | POA: Diagnosis not present

## 2017-12-24 DIAGNOSIS — I130 Hypertensive heart and chronic kidney disease with heart failure and stage 1 through stage 4 chronic kidney disease, or unspecified chronic kidney disease: Secondary | ICD-10-CM | POA: Diagnosis present

## 2017-12-24 DIAGNOSIS — R001 Bradycardia, unspecified: Secondary | ICD-10-CM | POA: Diagnosis present

## 2017-12-24 DIAGNOSIS — M1711 Unilateral primary osteoarthritis, right knee: Secondary | ICD-10-CM | POA: Diagnosis not present

## 2017-12-24 DIAGNOSIS — Z7901 Long term (current) use of anticoagulants: Secondary | ICD-10-CM | POA: Diagnosis not present

## 2017-12-24 DIAGNOSIS — I110 Hypertensive heart disease with heart failure: Secondary | ICD-10-CM | POA: Diagnosis not present

## 2017-12-24 DIAGNOSIS — G4733 Obstructive sleep apnea (adult) (pediatric): Secondary | ICD-10-CM | POA: Diagnosis present

## 2017-12-24 DIAGNOSIS — I959 Hypotension, unspecified: Secondary | ICD-10-CM | POA: Diagnosis present

## 2017-12-24 DIAGNOSIS — E871 Hypo-osmolality and hyponatremia: Secondary | ICD-10-CM | POA: Diagnosis not present

## 2017-12-24 DIAGNOSIS — I739 Peripheral vascular disease, unspecified: Secondary | ICD-10-CM | POA: Diagnosis not present

## 2017-12-24 DIAGNOSIS — I4891 Unspecified atrial fibrillation: Secondary | ICD-10-CM | POA: Diagnosis not present

## 2017-12-24 DIAGNOSIS — N183 Chronic kidney disease, stage 3 (moderate): Secondary | ICD-10-CM | POA: Diagnosis not present

## 2017-12-24 DIAGNOSIS — Z96651 Presence of right artificial knee joint: Secondary | ICD-10-CM | POA: Diagnosis present

## 2017-12-24 DIAGNOSIS — I481 Persistent atrial fibrillation: Secondary | ICD-10-CM | POA: Diagnosis not present

## 2017-12-24 DIAGNOSIS — Z79890 Hormone replacement therapy (postmenopausal): Secondary | ICD-10-CM | POA: Diagnosis not present

## 2017-12-24 DIAGNOSIS — I5032 Chronic diastolic (congestive) heart failure: Secondary | ICD-10-CM | POA: Diagnosis present

## 2017-12-24 DIAGNOSIS — K219 Gastro-esophageal reflux disease without esophagitis: Secondary | ICD-10-CM | POA: Diagnosis not present

## 2017-12-24 DIAGNOSIS — I5043 Acute on chronic combined systolic (congestive) and diastolic (congestive) heart failure: Secondary | ICD-10-CM | POA: Diagnosis not present

## 2017-12-24 LAB — CBC
HCT: 42.6 % (ref 36.0–46.0)
Hemoglobin: 14.5 g/dL (ref 12.0–15.0)
MCH: 30.7 pg (ref 26.0–34.0)
MCHC: 34 g/dL (ref 30.0–36.0)
MCV: 90.1 fL (ref 78.0–100.0)
Platelets: 186 10*3/uL (ref 150–400)
RBC: 4.73 MIL/uL (ref 3.87–5.11)
RDW: 13.2 % (ref 11.5–15.5)
WBC: 7.9 10*3/uL (ref 4.0–10.5)

## 2017-12-24 LAB — BASIC METABOLIC PANEL
Anion gap: 8 (ref 5–15)
BUN: 13 mg/dL (ref 8–23)
CHLORIDE: 101 mmol/L (ref 98–111)
CO2: 25 mmol/L (ref 22–32)
CREATININE: 1.08 mg/dL — AB (ref 0.44–1.00)
Calcium: 8.9 mg/dL (ref 8.9–10.3)
GFR, EST AFRICAN AMERICAN: 55 mL/min — AB (ref >60)
GFR, EST NON AFRICAN AMERICAN: 47 mL/min — AB (ref >60)
Glucose, Bld: 110 mg/dL — ABNORMAL HIGH (ref 70–99)
POTASSIUM: 3.7 mmol/L (ref 3.5–5.1)
SODIUM: 134 mmol/L — AB (ref 135–145)

## 2017-12-24 LAB — MRSA PCR SCREENING: MRSA by PCR: NEGATIVE

## 2017-12-24 LAB — TSH: TSH: 1.601 u[IU]/mL (ref 0.350–4.500)

## 2017-12-24 MED ORDER — METOPROLOL SUCCINATE ER 100 MG PO TB24
100.0000 mg | ORAL_TABLET | Freq: Every day | ORAL | Status: DC
  Administered 2017-12-25 – 2017-12-26 (×2): 100 mg via ORAL
  Filled 2017-12-24 (×2): qty 1

## 2017-12-24 NOTE — Progress Notes (Signed)
Progress Note  Patient Name: Lydia Graham Date of Encounter: 12/24/2017  Primary Cardiologist: Kate Sable, MD   Subjective   Feels a little better. No CP.  No SOB. Weak with her AFIB. Was walking a mile a day prior. 2  Inpatient Medications    Scheduled Meds: . amLODipine  5 mg Oral Q lunch  . apixaban  5 mg Oral BID  . estradiol  0.5 mg Oral Daily  . furosemide  40 mg Intravenous BID  . losartan  50 mg Oral Q lunch  . metoprolol succinate  100 mg Oral BID  . pantoprazole  40 mg Oral BID  . potassium chloride SA  20 mEq Oral Daily  . vitamin B-12  1,000 mcg Oral Daily   Continuous Infusions: . amiodarone 30 mg/hr (12/24/17 0843)   PRN Meds: acetaminophen, fluticasone, hydrALAZINE, magnesium hydroxide, ondansetron (ZOFRAN) IV   Vital Signs    Vitals:   12/23/17 2150 12/24/17 0318 12/24/17 0320 12/24/17 0815  BP:   132/77 121/76  Pulse:   82 78  Resp:   20 20  Temp:   99 F (37.2 C) (!) 97.4 F (36.3 C)  TempSrc:   Oral Oral  SpO2:   95% 94%  Weight: 146 lb 12.8 oz (66.6 kg) 145 lb 12.8 oz (66.1 kg)    Height: 5\' 7"  (1.702 m)       Intake/Output Summary (Last 24 hours) at 12/24/2017 0937 Last data filed at 12/24/2017 0339 Gross per 24 hour  Intake 500.52 ml  Output 500 ml  Net 0.52 ml   Filed Weights   12/23/17 2150 12/24/17 0318  Weight: 146 lb 12.8 oz (66.6 kg) 145 lb 12.8 oz (66.1 kg)    Telemetry    AFIB RVR now 90-100 - Personally Reviewed  ECG    AFIb rvr - Personally Reviewed  Physical Exam   GEN: No acute distress.   Neck: No JVD Cardiac: IRRR, no murmurs, rubs, or gallops.  Respiratory: Clear to auscultation bilaterally. GI: Soft, nontender, non-distended  MS: No edema; No deformity. Neuro:  Nonfocal  Psych: Normal affect   Labs    Chemistry Recent Labs  Lab 12/22/17 0810 12/23/17 1723 12/24/17 0439  NA 133* 133* 134*  K 3.9 4.6 3.7  CL 100 98 101  CO2 25 24 25   GLUCOSE 118* 101* 110*  BUN 17 15 13     CREATININE 1.03* 1.19* 1.08*  CALCIUM 9.0 9.4 8.9  GFRNONAA 50* 42* 47*  GFRAA 58* 49* 55*  ANIONGAP 8 11 8      Hematology Recent Labs  Lab 12/22/17 0810 12/23/17 1723 12/24/17 0439  WBC 8.1 10.2 7.9  RBC 4.72 5.17* 4.73  HGB 14.7 15.8* 14.5  HCT 43.4 48.1* 42.6  MCV 91.9 93.0 90.1  MCH 31.1 30.6 30.7  MCHC 33.9 32.8 34.0  RDW 13.7 13.4 13.2  PLT 185 235 186    Cardiac Enzymes Recent Labs  Lab 12/22/17 0810  TROPONINI <0.03    Recent Labs  Lab 12/23/17 1722  TROPIPOC 0.00     BNP Recent Labs  Lab 12/22/17 0810  BNP 1,367.0*     DDimer No results for input(s): DDIMER in the last 168 hours.   Radiology    Dg Chest 2 View  Result Date: 12/23/2017 CLINICAL DATA:  Atrial fibrillation and shortness of breath. EXAM: CHEST - 2 VIEW COMPARISON:  12/22/2017 and prior exam FINDINGS: Cardiomegaly with trace bilateral pleural effusions and mild bibasilar atelectasis again noted. There  is no evidence of focal airspace disease, pulmonary edema, suspicious pulmonary nodule/mass or pneumothorax. No acute bony abnormalities are identified. IMPRESSION: Cardiomegaly with trace bilateral pleural effusions and mild bibasilar atelectasis again noted. Electronically Signed   By: Margarette Canada M.D.   On: 12/23/2017 18:18    Cardiac Studies   ECHO 10/2017  - EF 50%  - LA moderate dilated  - Moderate MR  Patient Profile     82 y.o. female with AFIB, multiple cardioversion failed, HTN, CKD 3   Assessment & Plan    AFIB persistent  - on IV AMIO load  - 2 recent failed conversion (1st lasted about 48 hours)  - Moderate LA size. EF 50%  - Had sinus brady 31 post 2nd DCCV on 7/1 (see ECG) LBBB like appearance  - Will have EP see to discussed at her request potential ablation. Other possible plan, IV amio load, DCCV again Friday.   Essential HTN  - improved, no changes.   CKD 3  - stable   For questions or updates, please contact [place] Please consult  [email or web address] for contact info under Cardiology/STEMI.      Signed, Candee Furbish, MD  12/24/2017, 9:37 AM

## 2017-12-24 NOTE — Consult Note (Addendum)
Cardiology Consultation:   Patient ID: Lydia Graham; [id number]; 1935-11-26   Admit date: 12/23/2017 Date of Consult: 12/24/2017  Primary Care Provider: Glenda Chroman, MD Primary Cardiologist: Kate Sable, MD  Primary Electrophysiologist:  new   Patient Profile:   Lydia Graham is a 82 y.o. female with a hx of HTN, OA, PVD w/ hx of L-CEA, migraine HAs, GERD, and recently new onset PAFib who is being seen today for the evaluation of AFib management options at the request of Dr. Marlou Porch.  History of Present Illness:   Lydia Graham 1st noted to have AFib April 2019, this was associated with RVR, started on Toprol and Eliquis.  She felt very fatigued, suspect 2/2 to her AFib (+/- the BB dosed at 125mg  BID) and underwent DCCV after 3 weeks of uninterrupted a/c on 12/19/17.  This was successful to restore SR 65bpm, though had ERAF by 6/29 evening felt like she went back into AF.   12/22/17 she went to [facility] with CP, palpitations, weakness, and noted to be in AFib w/RVR 140's.  In communication with Dr. Bronson Ing, 150mg  bolus of amiodarone was given followed by DCCV that resulted in SB 30's given 2 doses of atropine, eventually rates increased 60's and she was discharged with an Rx for amiodarone.  By phone notes that evening by symptoms she was back in AFib, in communication with the office instructed to increase her amiodarone to 200mg  BID and f/u as scheduled, was apparently scheduled to see Dr. Curt Bears this week.  Though later her HR became elevated, feeling poorly came to the ER at [facility], was started on amiodarone gtt and admitted with fluid OL.  LABS K+ 3.7BUN/Creat 13/1.08 Mag 1.9 WBC 7.9 H/H 14/42 Plts 186 poc Trop 0.00 BNP 1367  The patient describes herself as very active, continues to contribute to the upkeep of the yard, rides the tractor to cut grass, tends the garden,  She walks a mile 5 days a week for exercise, is very active with her family and church, cares for her  own home.  Until the Afib had no exertional intolerances or complaints.  The AF makes her feel very tired, significantly diminished stamina.  No near syncope or syncope.  She does feel the palpitations on/off.  No rest SOB, but when in AF gets winded easily.  She denies any ETOH, caffeine, or drugs. Reports adequate hydration Denies any hx of or suspected sleep apnea. BMI is 22.8  She has not found any pattern for her AF, no clear triggers    Past Medical History:  Diagnosis Date  . Arthritis    oa, all over - multiple areas   . Dysrhythmia    AFib   . GERD (gastroesophageal reflux disease)   . Headache    h/o migraines   . Hypertension     Past Surgical History:  Procedure Laterality Date  . ABDOMINAL HYSTERECTOMY    . APPENDECTOMY    . BACK SURGERY    . CARDIOVERSION N/A 12/19/2017   Procedure: CARDIOVERSION;  Surgeon: Arnoldo Lenis, MD;  [facility];  Service: Endoscopy;  Laterality: N/A;  . ENDARTERECTOMY Left 03/27/2017   Procedure: ENDARTERECTOMY CAROTID LEFT;  Surgeon: [name], MD;  Location: [place];  Service: Vascular;  Laterality: Left;  . INNER EAR SURGERY Right    puntured eardrum- repaired 2x's   . NASAL SINUS SURGERY     deviated septum  . PATCH ANGIOPLASTY Left 03/27/2017   Procedure: PATCH ANGIOPLASTY  LEFT CAROTID ARTERY USING Rueben Bash BIOLOGIC PATCH;  Surgeon: [name], MD;  Location: [place];  Service: Vascular;  Laterality: Left;  . TONSILLECTOMY    . TOTAL KNEE ARTHROPLASTY Right 10/23/2016   Procedure: RIGHT TOTAL KNEE ARTHROPLASTY;  Surgeon: [name], MD;  Location: [place];  Service: Orthopedics;  Laterality: Right;       Inpatient Medications: Scheduled Meds: . amLODipine  5 mg Oral Q lunch  . apixaban  5 mg Oral BID  . estradiol  0.5 mg Oral Daily  . furosemide  40 mg Intravenous BID  . losartan  50 mg Oral Q lunch  . metoprolol succinate  100 mg Oral BID  . pantoprazole  40 mg Oral BID  .  potassium chloride SA  20 mEq Oral Daily  . vitamin B-12  1,000 mcg Oral Daily   Continuous Infusions: . amiodarone 30 mg/hr (12/24/17 0843)   PRN Meds: acetaminophen, fluticasone, hydrALAZINE, magnesium hydroxide, ondansetron (ZOFRAN) IV  Allergies:    Allergies  Allergen Reactions  . Rosuvastatin Other (See Comments)    MYALGIA's Muscle pain  . Other     PAIN MEDICATIONS/ANESTHESIA  MADE BP DROP LOW     Social History:   Social History   Socioeconomic History  . Marital status: Single    Spouse name: Not on file  . Number of children: Not on file  . Years of education: Not on file  . Highest education level: Not on file  Occupational History  . Not on file  Social Needs  . Financial resource strain: Not on file  . Food insecurity:    Worry: Not on file    Inability: Not on file  . Transportation needs:    Medical: Not on file    Non-medical: Not on file  Tobacco Use  . Smoking status: Never Smoker  . Smokeless tobacco: Never Used  Substance and Sexual Activity  . Alcohol use: No  . Drug use: No  . Sexual activity: Not Currently    Birth control/protection: Surgical  Lifestyle  . Physical activity:    Days per week: Not on file    Minutes per session: Not on file  . Stress: Not on file  Relationships  . Social connections:    Talks on phone: Not on file    Gets together: Not on file    Attends religious service: Not on file    Active member of club or organization: Not on file    Attends meetings of clubs or organizations: Not on file    Relationship status: Not on file  . Intimate partner violence:    Fear of current or ex partner: Not on file    Emotionally abused: Not on file    Physically abused: Not on file    Forced sexual activity: Not on file  Other Topics Concern  . Not on file  Social History Narrative  . Not on file    Family History:   Family History  Problem Relation Age of Onset  . Stroke Mother      ROS:  Please see the  history of present illness.  All other ROS reviewed and negative.     Physical Exam/Data:   Vitals:   12/24/17 0318 12/24/17 0320 12/24/17 0815 12/24/17 1220  BP:  132/77 121/76 114/75  Pulse:  82 78 60  Resp:  20 20 (!) 22  Temp:  99 F (37.2 C) (!) 97.4 F (36.3 C) 97.6 F (36.4 C)  TempSrc:  Oral Oral Oral  SpO2:  95% 94% 94%  Weight: 145 lb 12.8 oz (66.1 kg)     Height:        Intake/Output Summary (Last 24 hours) at 12/24/2017 1302 Last data filed at 12/24/2017 1227 Gross per 24 hour  Intake 960.52 ml  Output 1150 ml  Net -189.48 ml   Filed Weights   12/23/17 2150 12/24/17 0318  Weight: 146 lb 12.8 oz (66.6 kg) 145 lb 12.8 oz (66.1 kg)   Body mass index is 22.84 kg/m.  General:  Well nourished, well developed, in no acute distress HEENT: normal Lymph: no adenopathy Neck: no JVD Endocrine:  No thryomegaly Vascular: No carotid bruits  Cardiac:  irreg-irreg,  no murmurs, gallops or rubs Lungs:  CTA b/l, no wheezing, rhonchi or rales  Abd: soft, nontender  Ext: no edema Musculoskeletal:  No deformities, age appropriate atrophy Skin: warm and dry  Neuro:   No gross focal abnormalities noted Psych:  Normal affect   EKG:  The EKG was personally reviewed and demonstrates:   AFib 140bpm, LAD, poor R progression, QS V1-3 10/14/17 SR 60bpm, , LAD, QS V1-2 Telemetry:  Telemetry was personally reviewed and demonstrates:   AFib, generally 90's, intermittently faster 110-130  Relevant CV Studies:  11/06/17: TTE Study Conclusions - Left ventricle: Systolic function was difficult to assess due to   rapid atrial fibrillation, but is probably in the low normal   range. LVEF approximately 50%. The cavity size was normal. Wall   thickness was normal. Diffuse hypokinesis. The study was not   technically sufficient to allow evaluation of LV diastolic   dysfunction due to atrial fibrillation. - Mitral valve: There was moderate regurgitation. - Left atrium: The atrium was  moderately dilated. (96mm) - Right ventricle: Systolic function was mildly reduced. - Right atrium: The atrium was mildly dilated. - Tricuspid valve: There was moderate regurgitation. - Pulmonary arteries: PA peak pressure: 35 mm Hg (S). - Systemic veins: IVC is dilated with normal respiratory variation.     Estimated CVP 8 mmHg. - Pericardium, extracardiac: A trivial pericardial effusion was   identified. Impressions: - The patient was tachycardic throughout the study.  Laboratory Data:  Chemistry Recent Labs  Lab 12/22/17 0810 12/23/17 1723 12/24/17 0439  NA 133* 133* 134*  K 3.9 4.6 3.7  CL 100 98 101  CO2 25 24 25   GLUCOSE 118* 101* 110*  BUN 17 15 13   CREATININE 1.03* 1.19* 1.08*  CALCIUM 9.0 9.4 8.9  GFRNONAA 50* 42* 47*  GFRAA 58* 49* 55*  ANIONGAP 8 11 8     No results for input(s): PROT, ALBUMIN, AST, ALT, ALKPHOS, BILITOT in the last 168 hours. Hematology Recent Labs  Lab 12/22/17 0810 12/23/17 1723 12/24/17 0439  WBC 8.1 10.2 7.9  RBC 4.72 5.17* 4.73  HGB 14.7 15.8* 14.5  HCT 43.4 48.1* 42.6  MCV 91.9 93.0 90.1  MCH 31.1 30.6 30.7  MCHC 33.9 32.8 34.0  RDW 13.7 13.4 13.2  PLT 185 235 186   Cardiac Enzymes Recent Labs  Lab 12/22/17 0810  TROPONINI <0.03    Recent Labs  Lab 12/23/17 1722  TROPIPOC 0.00    BNP Recent Labs  Lab 12/22/17 0810  BNP 1,367.0*    DDimer No results for input(s): DDIMER in the last 168 hours.  Radiology/Studies:   Dg Chest 2 View Result Date: 12/23/2017 CLINICAL DATA:  Atrial fibrillation and shortness of breath. EXAM: CHEST - 2 VIEW COMPARISON:  12/22/2017 and  prior exam FINDINGS: Cardiomegaly with trace bilateral pleural effusions and mild bibasilar atelectasis again noted. There is no evidence of focal airspace disease, pulmonary edema, suspicious pulmonary nodule/mass or pneumothorax. No acute bony abnormalities are identified. IMPRESSION: Cardiomegaly with trace bilateral pleural effusions and mild bibasilar  atelectasis again noted. Electronically Signed   By: Margarette Canada M.D.   On: 12/23/2017 18:18     Assessment and Plan:   1. Persistent Afib     CHA2DS2Vasc is 4, on eliquis     Rates are improved on amiodarone  Currently on Metoprolol Succ 100mg  BID Amiodarone gtt  In d/w Dr. Marlou Porch, plans to try repeat DCCV perhaps Friday with amio on board This is a reasonable plan Check TSH   I think is best to try and establish SR with the amiodarone, discussed with the patient/family, potential side effects.  She would like to be considered for ablation rather then taking another drug.   [name] will see her and discuss further, she is likely a fairly reasonable candidate.    For questions or updates, please contact [place] Please consult [email or web address] for contact info under Cardiology/STEMI.   Signed, Baldwin Jamaica, PA-C  12/24/2017 1:02 PM   I have seen, examined the patient, and reviewed the above assessment and plan.  Changes to above are made where necessary.  On exam, iRRR.  Pt with persistent afib.  She is very worried about recurrent episodes.  Continue IV amiodarone until tomorrow and proceed with cardioversion again tomorrow.  Risks of procedure discussed with patient.  Continue anticoagulation.  Would plan amiodarone 200mg  BID at time of discharge. Follow-up in AF clinic next week.  Will at that time, discuss ablation further.  We did discuss ablation for afib at length today.  She thinks that she will want to proceed.  At time of follow-up in AF clinic, OK to schedule ablation if she still wishes to proceed.  Co Sign: [name], MD 12/25/2017 9:59 AM

## 2017-12-24 NOTE — Discharge Instructions (Addendum)
Atrial Fibrillation Atrial fibrillation is a type of heartbeat that is irregular or fast (rapid). If you have this condition, your heart keeps quivering in a weird (chaotic) way. This condition can make it so your heart cannot pump blood normally. Having this condition gives a person more risk for stroke, heart failure, and other heart problems. There are different types of atrial fibrillation. Talk with your doctor to learn about the type that you have. Follow these instructions at home:  Take over-the-counter and prescription medicines only as told by your doctor.  If your doctor prescribed a blood-thinning medicine, take it exactly as told. Taking too much of it can cause bleeding. If you do not take enough of it, you will not have the protection that you need against stroke and other problems.  Do not use any tobacco products. These include cigarettes, chewing tobacco, and e-cigarettes. If you need help quitting, ask your doctor.  If you have apnea (obstructive sleep apnea), manage it as told by your doctor.  Do not drink alcohol.  Do not drink beverages that have caffeine. These include coffee, soda, and tea.  Maintain a healthy weight. Do not use diet pills unless your doctor says they are safe for you. Diet pills may make heart problems worse.  Follow diet instructions as told by your doctor.  Exercise regularly as told by your doctor.  Keep all follow-up visits as told by your doctor. This is important. Contact a doctor if:  You notice a change in the speed, rhythm, or strength of your heartbeat.  You are taking a blood-thinning medicine and you notice more bruising.  You get tired more easily when you move or exercise. Get help right away if:  You have pain in your chest or your belly (abdomen).  You have sweating or weakness.  You feel sick to your stomach (nauseous).  You notice blood in your throw up (vomit), poop (stool), or pee (urine).  You are short of  breath.  You suddenly have swollen feet and ankles.  You feel dizzy.  Your suddenly get weak or numb in your face, arms, or legs, especially if it happens on one side of your body.  You have trouble talking, trouble understanding, or both.  Your face or your eyelid droops on one side. These symptoms may be an emergency. Do not wait to see if the symptoms will go away. Get medical help right away. Call your local emergency services (911 in the [place] Do not drive yourself to the hospital. This information is not intended to replace advice given to you by your health care provider. Make sure you discuss any questions you have with your health care provider. Document Released: 03/19/2008 Document Revised: 11/16/2015 Document Reviewed: 10/05/2014 Elsevier Interactive Patient Education  2018 [place] have an appointment set up with the [facility].  Multiple studies have shown that being followed by a dedicated atrial fibrillation clinic in addition to the standard care you receive from your other physicians improves health. We believe that enrollment in the atrial fibrillation clinic will allow [place] to better care for you.   The phone number to the [facility] is [phone]. The clinic is staffed Monday through Friday from 8:30am to 5pm.  Parking Directions: The clinic is located in the Heart and Vascular Building connected to [facility]. 1)From [address] turn on to [address] and go to the 3rd entrance  (Heart and Vascular entrance) on the right. 2)Look to  the right for Heart &Vascular Parking Garage. 3)A code for the entrance is required please call the clinic to receive this.   4)Take the elevators to the 1st floor. Registration is in the room with the glass walls at the end of the hallway.  If you have any trouble parking or locating the clinic, please dont hesitate to call [phone].  Information on my medicine - ELIQUIS  (apixaban)  This medication education was reviewed with me or my healthcare representative as part of my discharge preparation.    Why was Eliquis prescribed for you? Eliquis was prescribed for you to reduce the risk of a blood clot forming that can cause a stroke if you have a medical condition called atrial fibrillation (a type of irregular heartbeat).  What do You need to know about Eliquis ? Take your Eliquis TWICE DAILY - one tablet in the morning and one tablet in the evening with or without food. If you have difficulty swallowing the tablet whole please discuss with your pharmacist how to take the medication safely.  Take Eliquis exactly as prescribed by your doctor and DO NOT stop taking Eliquis without talking to the doctor who prescribed the medication.  Stopping may increase your risk of developing a stroke.  Refill your prescription before you run out.  After discharge, you should have regular check-up appointments with your healthcare provider that is prescribing your Eliquis.  In the future your dose may need to be changed if your kidney function or weight changes by a significant amount or as you get older.  What do you do if you miss a dose? If you miss a dose, take it as soon as you remember on the same day and resume taking twice daily.  Do not take more than one dose of ELIQUIS at the same time to make up a missed dose.  Important Safety Information A possible side effect of Eliquis is bleeding. You should call your healthcare provider right away if you experience any of the following: ? Bleeding from an injury or your nose that does not stop. ? Unusual colored urine (red or dark brown) or unusual colored stools (red or black). ? Unusual bruising for unknown reasons. ? A serious fall or if you hit your head (even if there is no bleeding).  Some medicines may interact with Eliquis and might increase your risk of bleeding or clotting while on Eliquis. To help avoid  this, consult your healthcare provider or pharmacist prior to using any new prescription or non-prescription medications, including herbals, vitamins, non-steroidal anti-inflammatory drugs (NSAIDs) and supplements.  This website has more information on Eliquis (apixaban): http://www.eliquis.com/eliquis/home

## 2017-12-26 ENCOUNTER — Encounter (HOSPITAL_COMMUNITY)
Admission: EM | Disposition: A | Discharge status: Home / Self Care | Payer: Self-pay | Source: Home / Self Care | Attending: Cardiology

## 2017-12-26 ENCOUNTER — Inpatient Hospital Stay (HOSPITAL_COMMUNITY): Payer: Medicare Other | Admitting: Anesthesiology

## 2017-12-26 ENCOUNTER — Encounter (HOSPITAL_COMMUNITY): Payer: Self-pay | Admitting: *Deleted

## 2017-12-26 HISTORY — PX: CARDIOVERSION: SHX1299

## 2017-12-26 LAB — HEPATIC FUNCTION PANEL
ALT: 42 U/L (ref 0–44)
AST: 32 U/L (ref 15–41)
Albumin: 3.4 g/dL — ABNORMAL LOW (ref 3.5–5.0)
Alkaline Phosphatase: 95 U/L (ref 38–126)
BILIRUBIN INDIRECT: 0.5 mg/dL (ref 0.3–0.9)
Bilirubin, Direct: 0.3 mg/dL — ABNORMAL HIGH (ref 0.0–0.2)
TOTAL PROTEIN: 5.7 g/dL — AB (ref 6.5–8.1)
Total Bilirubin: 0.8 mg/dL (ref 0.3–1.2)

## 2017-12-26 SURGERY — CARDIOVERSION
Anesthesia: General

## 2017-12-26 MED ORDER — LIDOCAINE 2% (20 MG/ML) 5 ML SYRINGE
INTRAMUSCULAR | Status: DC | PRN
  Administered 2017-12-26: 60 mg via INTRAVENOUS

## 2017-12-26 MED ORDER — SODIUM CHLORIDE 0.9% FLUSH: 3.0000 mL | INTRAVENOUS | Status: DC | PRN

## 2017-12-26 MED ORDER — HYDROCORTISONE 1 % EX CREA
1.0000 "application " | TOPICAL_CREAM | Freq: Three times a day (TID) | CUTANEOUS | Status: DC | PRN
  Filled 2017-12-26: qty 28

## 2017-12-26 MED ORDER — SODIUM CHLORIDE 0.9 % IV SOLN
250.0000 mL | INTRAVENOUS | Status: DC
  Administered 2017-12-26: 250 mL via INTRAVENOUS

## 2017-12-26 MED ORDER — PROPOFOL 10 MG/ML IV BOLUS
INTRAVENOUS | Status: DC | PRN
  Administered 2017-12-26: 20 mg via INTRAVENOUS
  Administered 2017-12-26: 50 mg via INTRAVENOUS

## 2017-12-26 MED ORDER — SODIUM CHLORIDE 0.9% FLUSH
3.0000 mL | Freq: Two times a day (BID) | INTRAVENOUS | Status: DC
  Administered 2017-12-26: 3 mL via INTRAVENOUS

## 2017-12-26 MED ORDER — SODIUM CHLORIDE 0.9 % IV SOLN
INTRAVENOUS | Status: DC | PRN
  Administered 2017-12-26: 13:00:00 via INTRAVENOUS

## 2017-12-26 MED ORDER — METOPROLOL SUCCINATE ER 100 MG PO TB24: 100.0000 mg | ORAL_TABLET | Freq: Every day | ORAL | 3 refills | Status: DC

## 2017-12-26 NOTE — H&P (Signed)
   INTERVAL PROCEDURE H&P  History and Physical Interval Note:  12/26/2017 12:42 PM  [name] has presented today for their planned procedure. The various methods of treatment have been discussed with the patient and family. After consideration of risks, benefits and other options for treatment, the patient has consented to the procedure.  The patients' outpatient history has been reviewed, patient examined, and no change in status from most recent office note within the past 30 days. I have reviewed the patients' chart and labs and will proceed as planned. Questions were answered to the patient's satisfaction.   [name], MD, [facility], [place] Director of the Advanced Lipid Disorders &  Cardiovascular Risk Reduction Clinic Diplomate of the American Board of Clinical Lipidology Attending Cardiologist  Direct Dial: [phone]  Fax: [phone]  Website:  [email or web address] Joeline Freer 12/26/2017, 12:42 PM

## 2017-12-26 NOTE — Anesthesia Preprocedure Evaluation (Addendum)
Anesthesia Evaluation  Patient identified by MRN, date of birth, ID band Patient awake    Reviewed: Allergy & Precautions, NPO status , Patient's Chart, lab work & pertinent test results  Airway Mallampati: III  TM Distance: >3 FB Neck ROM: Full    Dental no notable dental hx.    Pulmonary neg pulmonary ROS,    Pulmonary exam normal breath sounds clear to auscultation       Cardiovascular hypertension, Pt. on medications and Pt. on home beta blockers Normal cardiovascular exam Rhythm:Regular Rate:Normal     Neuro/Psych  Headaches, negative psych ROS   GI/Hepatic Neg liver ROS, GERD  Medicated and Controlled,  Endo/Other  negative endocrine ROS  Renal/GU negative Renal ROS     Musculoskeletal negative musculoskeletal ROS (+)   Abdominal   Peds  Hematology negative hematology ROS (+)   Anesthesia Other Findings AFIB wityh RVR  Reproductive/Obstetrics                            Anesthesia Physical Anesthesia Plan  ASA: IV  Anesthesia Plan: General   Post-op Pain Management:    Induction: Intravenous  PONV Risk Score and Plan: 3 and Propofol infusion and Treatment may vary due to age or medical condition  Airway Management Planned: Mask  Additional Equipment:   Intra-op Plan:   Post-operative Plan:   Informed Consent: I have reviewed the patients History and Physical, chart, labs and discussed the procedure including the risks, benefits and alternatives for the proposed anesthesia with the patient or authorized representative who has indicated his/her understanding and acceptance.   Dental advisory given  Plan Discussed with: CRNA  Anesthesia Plan Comments:        Anesthesia Quick Evaluation

## 2017-12-26 NOTE — Progress Notes (Signed)
NCM spoke to pt and dtr at bedside. Pt independent at home. Has RW and bedside commode if needed. Her neighbor and sister in law will drive her to appts if needed. Dtr and son both live out of town. Jonnie Finner RN CCM Case Mgmt phone [phone]

## 2017-12-26 NOTE — Discharge Summary (Signed)
DISCHARGE SUMMARY    Patient ID: Lydia Graham,  MRN: [id number], DOB/AGE: [birth year] 82 y.o.  Admit date: 12/23/2017 Discharge date: 12/26/2017  Primary Care Physician: Glenda Chroman, MD  Primary Cardiologist: Dr. Bronson Ing Electrophysiologist: new to [name]  Primary Discharge Diagnosis:  1. AFib w/RVR     CHA2DS2Vasc is 4, on Eliquis  Secondary Discharge Diagnosis:  1. HTN 2. OSA 3. PVD     h/o L-CEA  Allergies  Allergen Reactions  . Rosuvastatin Other (See Comments)    MYALGIA's Muscle pain  . Other     PAIN MEDICATIONS/ANESTHESIA  MADE BP DROP LOW      Procedures This Admission:  1.  12/26/17: DCCV, Dr. Debara Pickett  Brief HPI: Lydia Graham is a 82 y.o. female was admitted to [facility] with recurrent AFib, RVR  Hospital Course:  The patient was 1st noted to have AFib April 2019, this was associated with RVR, started on Toprol and Eliquis.  She felt very fatigued, suspect 2/2 to her AFib (+/- the BB dosed at 125mg  BID) and underwent DCCV after 3 weeks of uninterrupted a/c on 12/19/17.  This was successful to restore SR 65bpm, though had ERAF by 6/29 evening felt like she went back into AF.   12/22/17 she went to [facility] with CP, palpitations, weakness, and noted to be in AFib w/RVR 140's.  In communication with Dr. Bronson Ing, 150mg  bolus of amiodarone was given followed by DCCV that resulted in SB 30's given 2 doses of atropine, eventually rates increased 60's and she was discharged with an Rx for amiodarone.  By phone notes that evening by symptoms she felt like she was back in AFib, in communication with the office instructed to increase her amiodarone to 200mg  BID and f/u as scheduled, though later her HR became elevated, feeling poorly came to the ER at [facility], was started on amiodarone gtt and admitted with trace pleural effusions started on lasix as well  She was rate controlled and diuresed with plans to retry DCCV on AAD tx..  EP was consulted given patient's  interest in discussing ablation as possible treatment strategy. The patient is a very active lady and when in AFib she is significantly limited in her exertional capacity and generally just feels poorly in AF.  In d/w [name], agreed with Dr. Marlou Porch' thoughts, to DCCV on amiodarone, plan to discharge home on PO amiodarone and plan early follow up with the AFib clinic, and discuss repeat DCCV and perhaps discussion on plans for ablation.  LFTs were added to her labs for baseline.   The patient underwent DCCV that was successful, though unfortunately had ERAF only after 15 minutes.  Will discharge, and is instructed to take 400mg  amiodarone BID until seen in the AFib clinic next week.  The patient was seen and examined by Dr. Caryl Comes and felt stable to discharge to home.  Continue her home meds otherwise, with a reduction in her metoprolol succ. to 100mg  daily.  She will continue her lasix and K+ once daily as instructed by Dr. Caryl Comes    Physical Exam: Vitals:   12/26/17 1228 12/26/17 1309 12/26/17 1320 12/26/17 1330  BP: (!) 143/106 (!) 98/51 (!) 105/55 (!) 134/57  Pulse:  (!) 57 (!) 57 (!) 58  Resp: 17 20 18 19   Temp: 97.8 F (36.6 C) 97.8 F (36.6 C)    TempSrc: Oral Oral    SpO2: 94% 99% 94% 93%  Weight: 143 lb (64.9 kg)  Height: 5\' 7"  (1.702 m)       GEN- The patient is well appearing, alert and oriented x 3 today.   HEENT: normocephalic, atraumatic; sclera clear, conjunctiva pink; hearing intact; oropharynx clear; neck supple, no JVP Lungs- CTA, normal work of breathing.  No wheezes, rales, rhonchi Heart- irreg-irreg, no murmurs, rubs or gallops, PMI not laterally displaced GI- soft, non-tender, non-distended Extremities- no clubbing, cyanosis, or edema MS- no significant deformity or atrophy Skin- warm and dry, no rash or lesion Psych- euthymic mood, full affect Neuro- no gross deficits   Labs:   Lab Results  Component Value Date   WBC 7.9 12/24/2017   HGB 14.5  12/24/2017   HCT 42.6 12/24/2017   MCV 90.1 12/24/2017   PLT 186 12/24/2017    Recent Labs  Lab 12/24/17 0439  NA 134*  K 3.7  CL 101  CO2 25  BUN 13  CREATININE 1.08*  CALCIUM 8.9  GLUCOSE 110*    Discharge Medications:  Allergies as of 12/26/2017      Reactions   Rosuvastatin Other (See Comments)   MYALGIA's Muscle pain   Other    PAIN MEDICATIONS/ANESTHESIA  MADE BP DROP LOW       Medication List    TAKE these medications   amiodarone 200 MG tablet Commonly known as:  PACERONE Take 1 tablet (200 mg total) by mouth daily. Notes to patient:  Take 400mg  (2 tablets) twice daily until seen in the AFib clinic on 01/02/18 for further instructions   amLODipine 5 MG tablet Commonly known as:  NORVASC Take 1 tablet (5 mg total) by mouth daily with lunch.   apixaban 5 MG Tabs tablet Commonly known as:  ELIQUIS Take 1 tablet (5 mg total) by mouth 2 (two) times daily.   CALCIUM 600+D PO Take 1 tablet by mouth daily.   estradiol 0.5 MG tablet Commonly known as:  ESTRACE Take 0.5 mg by mouth daily.   fluticasone 50 MCG/ACT nasal spray Commonly known as:  FLONASE Place 1 spray into both nostrils daily as needed for allergies.   furosemide 40 MG tablet Commonly known as:  LASIX Take 1 tablet (40 mg total) by mouth 2 (two) times daily. For 3 days, then resume daily What changed:    when to take this  additional instructions Notes to patient:  Take once daily as you were   losartan 50 MG tablet Commonly known as:  COZAAR Take 50 mg by mouth daily with lunch.   magnesium hydroxide 400 MG/5ML suspension Commonly known as:  MILK OF MAGNESIA Take 30 mLs by mouth daily as needed for mild constipation.   metoprolol succinate 100 MG 24 hr tablet Commonly known as:  TOPROL XL Take 1 tablet (100 mg total) by mouth daily. Take with or immediately following a meal. Start taking on:  12/27/2017 What changed:  when to take this   pantoprazole 40 MG tablet Commonly known  as:  PROTONIX Take 1 tablet (40 mg total) by mouth 2 (two) times daily.   potassium chloride SA 20 MEQ tablet Commonly known as:  K-DUR,KLOR-CON Take 1 tablet (20 mEq total) by mouth 2 (two) times daily. For 3 days, then resume daily What changed:    when to take this  additional instructions Notes to patient:  Take once daily with the furosemide as you were   vitamin B-12 1000 MCG tablet Commonly known as:  CYANOCOBALAMIN Take 1,000 mcg by mouth daily.       Disposition:  Home  Discharge Instructions    Diet - low sodium heart healthy   Complete by:  As directed    Increase activity slowly   Complete by:  As directed      Follow-up Information    [facility] up on 01/02/2018.   Specialty:  Cardiology Why:  9:30AM Contact information: [address] [id number] [place] [phone]          Duration of Discharge Encounter: Greater than 30 minutes including physician time.  Venetia Night, PA-C 12/26/2017 3:00 PM

## 2017-12-26 NOTE — Progress Notes (Signed)
The patient has reverted to atrial fibrillation following cardioversion.  The family has a great many questions regarding next steps.  Her heart failure is much improved with an interval 20 pound diuresis.  We will discharge her on Lasix 40 mg daily.  Heart rates are improved also in the 1-110 range mostly instead of the 150+ range.  Hopefully as the amiodarone accumulates heart rates will prove.  We have discussed that we would anticipate repeat cardioversion in a couple of weeks, with an interim visit in the A. fib clinic next week.  Dr. Jackalyn Lombard note from yesterday had suggested moving towards atrial fibrillation ablation and that that could be addressed at the A. fib clinic appointment.  The family is agreeable.  They have concerns about her level of activity and her being by herself.  We will not add further rate controlling drugs for now as her resting rates sinus on the slower side.

## 2017-12-26 NOTE — Anesthesia Postprocedure Evaluation (Signed)
Anesthesia Post Note  Patient: [name]  Procedure(s) Performed: CARDIOVERSION (N/A )     Patient location during evaluation: PACU Anesthesia Type: General Level of consciousness: awake Pain management: pain level controlled Vital Signs Assessment: post-procedure vital signs reviewed and stable Respiratory status: spontaneous breathing, nonlabored ventilation, respiratory function stable and patient connected to nasal cannula oxygen Cardiovascular status: blood pressure returned to baseline and stable Postop Assessment: no apparent nausea or vomiting Anesthetic complications: no    Last Vitals:  Vitals:   12/26/17 1320 12/26/17 1330  BP: (!) 105/55 (!) 134/57  Pulse: (!) 57 (!) 58  Resp: 18 19  Temp:    SpO2: 94% 93%    Last Pain:  Vitals:   12/26/17 1330  TempSrc:   PainSc: 0-No pain                 Ryan P Ellender

## 2017-12-26 NOTE — Anesthesia Procedure Notes (Signed)
Procedure Name: MAC Date/Time: 12/26/2017 12:56 PM Performed by: Renato Shin, CRNA Pre-anesthesia Checklist: Patient identified, Emergency Drugs available, Suction available and Patient being monitored Patient Re-evaluated:Patient Re-evaluated prior to induction Oxygen Delivery Method: Nasal cannula Preoxygenation: Pre-oxygenation with 100% oxygen Induction Type: IV induction Placement Confirmation: positive ETCO2,  CO2 detector and breath sounds checked- equal and bilateral Dental Injury: Teeth and Oropharynx as per pre-operative assessment

## 2017-12-26 NOTE — Plan of Care (Signed)
Pt education given to pt and her friend about amioderone drip and NPO orders, pt verbalized understanding, compliant with use of call light to request assistance, call light within reach, friend at bedside.  Edward Qualia RN

## 2017-12-26 NOTE — Transfer of Care (Signed)
Immediate Anesthesia Transfer of Care Note  Patient: Lydia Graham  Procedure(s) Performed: CARDIOVERSION (N/A )  Patient Location: Endoscopy Unit  Anesthesia Type:General  Level of Consciousness: awake, alert , oriented and patient cooperative  Airway & Oxygen Therapy: Patient Spontanous Breathing and Patient connected to nasal cannula oxygen  Post-op Assessment: Report given to RN and Post -op Vital signs reviewed and stable  Post vital signs: Reviewed and stable  Last Vitals:  Vitals Value Taken Time  BP    Temp    Pulse    Resp    SpO2      Last Pain:  Vitals:   12/26/17 1228  TempSrc: Oral  PainSc: 0-No pain      Patients Stated Pain Goal: 0 (07/17/91 5940)  Complications: No apparent anesthesia complications

## 2017-12-26 NOTE — Care Management Important Message (Signed)
Important Message  Patient Details  Name: Lydia Graham MRN: [id number] Date of Birth: 25-Aug-1935   Medicare Important Message Given:  Yes    Erenest Rasher, RN 12/26/2017, 3:22 PM

## 2017-12-26 NOTE — CV Procedure (Signed)
   CARDIOVERSION NOTE  Procedure: Electrical Cardioversion Indications:  Atrial Fibrillation  Procedure Details:  Consent: Risks of procedure as well as the alternatives and risks of each were explained to the (patient/caregiver).  Consent for procedure obtained.  Time Out: Verified patient identification, verified procedure, site/side was marked, verified correct patient position, special equipment/implants available, medications/allergies/relevent history reviewed, required imaging and test results available.  Performed  Patient placed on cardiac monitor, pulse oximetry, supplemental oxygen as necessary.  Sedation given: Propofol per anesthesia Pacer pads placed anterior and posterior chest.  Cardioverted 1 time(s).  Cardioverted at 150J biphasic.  Impression: Findings: Post procedure EKG shows: NSR Complications: None Patient did tolerate procedure well.  Plan: 1. Successful DCCV with a single 150J biphasic shock to NSR.  Time Spent Directly with the Patient:  30 minutes   [name], MD, [facility], [place] Director of the Advanced Lipid Disorders &  Cardiovascular Risk Reduction Clinic Diplomate of the American Board of Clinical Lipidology Attending Cardiologist  Direct Dial: [phone]  Fax: [phone]  Website:  [email or web address]  Lydia Graham 12/26/2017, 1:25 PM

## 2017-12-26 NOTE — Progress Notes (Addendum)
Progress Note  Patient Name: Lydia Graham Date of Encounter: 12/26/2017  Primary Cardiologist: Kate Sable, MD   Subjective   Optimistic about plan, hopefull she will maintain SR on drug.  Feels good this AM  Inpatient Medications    Scheduled Meds: . amLODipine  5 mg Oral Q lunch  . apixaban  5 mg Oral BID  . estradiol  0.5 mg Oral Daily  . losartan  50 mg Oral Q lunch  . metoprolol succinate  100 mg Oral Daily  . pantoprazole  40 mg Oral BID  . potassium chloride SA  20 mEq Oral Daily  . sodium chloride flush  3 mL Intravenous Q12H  . vitamin B-12  1,000 mcg Oral Daily   Continuous Infusions: . sodium chloride    . amiodarone 30 mg/hr (12/26/17 0925)   PRN Meds: acetaminophen, fluticasone, hydrALAZINE, hydrocortisone cream, magnesium hydroxide, ondansetron (ZOFRAN) IV, sodium chloride flush   Vital Signs    Vitals:   12/25/17 2055 12/26/17 0004 12/26/17 0452 12/26/17 0744  BP: 125/69 127/70 124/81 135/79  Pulse: 85 95 70 100  Resp: 16 16 16    Temp: (!) 97.5 F (36.4 C) 97.6 F (36.4 C) 98.2 F (36.8 C) 97.9 F (36.6 C)  TempSrc: Oral Oral Oral Oral  SpO2: 94% 92% 96% 96%  Weight:   143 lb 12.8 oz (65.2 kg)   Height:        Intake/Output Summary (Last 24 hours) at 12/26/2017 0938 Last data filed at 12/26/2017 0654 Gross per 24 hour  Intake 1220.71 ml  Output 450 ml  Net 770.71 ml   Filed Weights   12/24/17 0318 12/25/17 0412 12/26/17 0452  Weight: 145 lb 12.8 oz (66.1 kg) 142 lb 12.8 oz (64.8 kg) 143 lb 12.8 oz (65.2 kg)    Telemetry    AFib 90's - Personally Reviewed  ECG    No new EKGs - Personally Reviewed  Physical Exam   GEN: No acute distress.   Neck: No JVD Cardiac: irreg-irreg, no murmurs, rubs, or gallops.  Respiratory: CTA b/l. GI: Soft, nontender, non-distended  MS: No edema; No deformity. Neuro:  Nonfocal  Psych: Normal affect   Labs    Chemistry Recent Labs  Lab 12/22/17 0810 12/23/17 1723 12/24/17 0439    NA 133* 133* 134*  K 3.9 4.6 3.7  CL 100 98 101  CO2 25 24 25   GLUCOSE 118* 101* 110*  BUN 17 15 13   CREATININE 1.03* 1.19* 1.08*  CALCIUM 9.0 9.4 8.9  GFRNONAA 50* 42* 47*  GFRAA 58* 49* 55*  ANIONGAP 8 11 8      Hematology Recent Labs  Lab 12/22/17 0810 12/23/17 1723 12/24/17 0439  WBC 8.1 10.2 7.9  RBC 4.72 5.17* 4.73  HGB 14.7 15.8* 14.5  HCT 43.4 48.1* 42.6  MCV 91.9 93.0 90.1  MCH 31.1 30.6 30.7  MCHC 33.9 32.8 34.0  RDW 13.7 13.4 13.2  PLT 185 235 186    Cardiac Enzymes Recent Labs  Lab 12/22/17 0810  TROPONINI <0.03    Recent Labs  Lab 12/23/17 1722  TROPIPOC 0.00     BNP Recent Labs  Lab 12/22/17 0810  BNP 1,367.0*     DDimer No results for input(s): DDIMER in the last 168 hours.   Radiology    No results found.  Cardiac Studies   11/06/17: TTE Study Conclusions - Left ventricle: Systolic function was difficult to assess due to rapid atrial fibrillation, but is probably in the low  normal range. LVEF approximately 50%. The cavity size was normal. Wall thickness was normal. Diffuse hypokinesis. The study was not technically sufficient to allow evaluation of LV diastolic dysfunction due to atrial fibrillation. - Mitral valve: There was moderate regurgitation. - Left atrium: The atrium was moderately dilated. (29mm) - Right ventricle: Systolic function was mildly reduced. - Right atrium: The atrium was mildly dilated. - Tricuspid valve: There was moderate regurgitation. - Pulmonary arteries: PA peak pressure: 35 mm Hg (S). - Systemic veins: IVC is dilated with normal respiratory variation.   Estimated CVP 8 mmHg. - Pericardium, extracardiac: A trivial pericardial effusion was identified. Impressions: - The patient was tachycardic throughout the study.   Patient Profile     82 y.o. female with a hx of HTN, OA, PVD w/ hx of L-CEA, migraine HAs, GERD, and Persistent Afib, admitted with AFib w/RVR  Assessment & Plan     1. Persistent Afib     CHA2DS2Vasc is 4, on eliquis     Rates are improved on amiodarone     TSH was OK 1.601  Planned for DCCV this afternoon, then transition to PO amiodarone, anticipate home this afternoon with early f/u with AFib clinic  2. HTN     No changes today  3. Fluid OL     S/p lasix     Exam appears euvolemic      Do not anticipate need for diuretic at discharge    For questions or updates, please contact [facility] Please consult [email or web address] for contact info under Cardiology/STEMI.      Signed, Baldwin Jamaica, PA-C  12/26/2017, 9:38 AM    Reviewed with Dr Greggory Brandy  Plan DCCV amio load and followup in AFib clinic  Will discharge on amio 400 bid  Will repeat transaminases today as they were markedly abnormal 4/19

## 2017-12-29 ENCOUNTER — Ambulatory Visit: Payer: Medicare Other | Admitting: Cardiovascular Disease

## 2017-12-29 ENCOUNTER — Telehealth: Payer: Self-pay | Admitting: Pulmonary Disease

## 2017-12-29 ENCOUNTER — Encounter: Payer: Self-pay | Admitting: Pulmonary Disease

## 2017-12-29 ENCOUNTER — Ambulatory Visit (INDEPENDENT_AMBULATORY_CARE_PROVIDER_SITE_OTHER): Payer: Medicare Other | Admitting: Pulmonary Disease

## 2017-12-29 VITALS — BP 122/78 | HR 56 | Ht 67.0 in | Wt 147.0 lb

## 2017-12-29 DIAGNOSIS — I4891 Unspecified atrial fibrillation: Secondary | ICD-10-CM | POA: Diagnosis not present

## 2017-12-29 DIAGNOSIS — R05 Cough: Secondary | ICD-10-CM | POA: Diagnosis not present

## 2017-12-29 DIAGNOSIS — R053 Chronic cough: Secondary | ICD-10-CM

## 2017-12-29 DIAGNOSIS — R0602 Shortness of breath: Secondary | ICD-10-CM

## 2017-12-29 DIAGNOSIS — I6522 Occlusion and stenosis of left carotid artery: Secondary | ICD-10-CM | POA: Diagnosis not present

## 2017-12-29 MED FILL — Medication: Qty: 1 | Status: AC

## 2017-12-29 NOTE — Assessment & Plan Note (Addendum)
Dry cough may have been related to viral bronchitis. She does not appear to have pulmonary fibrosis.  Pulmonary infiltrates seem to have been related to fluid which cleared up with Lasix.  Surprisingly spirometry shows airway obstruction and this will need to be rechecked

## 2017-12-29 NOTE — Progress Notes (Signed)
Subjective:    Patient ID: Lydia Graham, female    DOB: [birth year], 82 y.o.   MRN: [id number]  HPI  Chief Complaint  Patient presents with  . Pulm Consult    Per patient and daughter, she been coughing and wheezing for the past 2 months. Has a history of AFib.    82 year old never smoker presents for evaluation of cough and abnormal chest x-ray She was first evaluated by her PCP at [facility] on 5/14 for a dry cough to the point of vomiting, felt to have allergic rhinitis, given Nasacort and Depo-Medrol.  When her cough persisted she was evaluated again on 5/28, chest x-ray was performed which showed early megaly and bilateral interstitial prominence.  I reviewed the images small effusion was also noted in the lung base.  Since then she has had trouble with atrial fibrillation.  Atrial for ablation was first diagnosed in April and after placing on anticoagulation she underwent DCCV on 6/28 She had recurrence in 7/1 and went to having pain emergency room where she received 150 mg amiodarone bolus Readmitted 7/2 to 7/5 for A. fib/RVR and was then increased to amiodarone 400 mg twice daily She has now run out of her pills and requests some additional pills, her follow-up with A. fib clinic is on Friday.  She was diuresed from 162 to 143 pounds with Lasix and feels significantly improved. Chest x-ray 7/2 was reviewed with also shows improved bilateral infiltrates and effusions are resolved.  Blood work was reviewed which shows no evidence of leukocytosis or eosinophilia She denies prior history of asthma, she is a lifelong never smoker, worked in [employer] in [place] before retirement.  She ambulates with a cane due to balance  Her dry cough is much improved, no wheezing or sputum production  Significant tests/ events reviewed  Spirometry shows moderate restriction with ratio of 58 with FEV1 of 62% and FVC of 83% suggesting moderate airway obstruction  Past Medical History:  Diagnosis Date   . Arthritis    oa, all over - multiple areas   . Dysrhythmia    AFib   . GERD (gastroesophageal reflux disease)   . Headache    h/o migraines   . Hypertension     Past Surgical History:  Procedure Laterality Date  . ABDOMINAL HYSTERECTOMY    . APPENDECTOMY    . BACK SURGERY    . CARDIOVERSION N/A 12/19/2017   Procedure: CARDIOVERSION;  Surgeon: Arnoldo Lenis, MD;  Location: [facility];  Service: Endoscopy;  Laterality: N/A;  . CARDIOVERSION N/A 12/26/2017   Procedure: CARDIOVERSION;  Surgeon: [name], MD;  Location: [facility] ENDOSCOPY;  Service: Cardiovascular;  Laterality: N/A;  . ENDARTERECTOMY Left 03/27/2017   Procedure: ENDARTERECTOMY CAROTID LEFT;  Surgeon: [name], MD;  Location: [place];  Service: Vascular;  Laterality: Left;  . INNER EAR SURGERY Right    puntured eardrum- repaired 2x's   . NASAL SINUS SURGERY     deviated septum  . PATCH ANGIOPLASTY Left 03/27/2017   Procedure: PATCH ANGIOPLASTY LEFT CAROTID ARTERY USING Rueben Bash BIOLOGIC PATCH;  Surgeon: [name], MD;  Location: [place];  Service: Vascular;  Laterality: Left;  . TONSILLECTOMY    . TOTAL KNEE ARTHROPLASTY Right 10/23/2016   Procedure: RIGHT TOTAL KNEE ARTHROPLASTY;  Surgeon: [name], MD;  Location: [place];  Service: Orthopedics;  Laterality: Right;    Allergies  Allergen Reactions  . Rosuvastatin Other (See Comments)    MYALGIA's  Muscle pain  . Lisinopril     Cough   . Other     PAIN MEDICATIONS/ANESTHESIA  MADE BP DROP LOW   . Singulair [Montelukast Sodium]     fatigue    Social History   Socioeconomic History  . Marital status: Single    Spouse name: Not on file  . Number of children: Not on file  . Years of education: Not on file  . Highest education level: Not on file  Occupational History  . Not on file  Social Needs  . Financial resource strain: Not on file  . Food insecurity:    Worry: Not on file    Inability: Not on file  .  Transportation needs:    Medical: Not on file    Non-medical: Not on file  Tobacco Use  . Smoking status: Never Smoker  . Smokeless tobacco: Never Used  Substance and Sexual Activity  . Alcohol use: No  . Drug use: No  . Sexual activity: Not Currently    Birth control/protection: Surgical  Lifestyle  . Physical activity:    Days per week: Not on file    Minutes per session: Not on file  . Stress: Not on file  Relationships  . Social connections:    Talks on phone: Not on file    Gets together: Not on file    Attends religious service: Not on file    Active member of club or organization: Not on file    Attends meetings of clubs or organizations: Not on file    Relationship status: Not on file  . Intimate partner violence:    Fear of current or ex partner: Not on file    Emotionally abused: Not on file    Physically abused: Not on file    Forced sexual activity: Not on file  Other Topics Concern  . Not on file  Social History Narrative  . Not on file     Family History  Problem Relation Age of Onset  . Stroke Mother     Review of Systems   Constitutional: negative for anorexia, fevers and sweats  Eyes: negative for irritation, redness and visual disturbance  Ears, nose, mouth, throat, and face: negative for earaches, epistaxis, nasal congestion and sore throat  Respiratory: negative forsputum and wheezing  Cardiovascular: negative for chest pain, dyspnea, lower extremity edema, orthopnea and syncope  Gastrointestinal: negative for abdominal pain, constipation, diarrhea, melena, nausea and vomiting  Genitourinary:negative for dysuria, frequency and hematuria  Hematologic/lymphatic: negative for bleeding, easy bruising and lymphadenopathy  Musculoskeletal:negative for arthralgias, muscle weakness and stiff joints  Neurological: negative for coordination problems, gait problems, headaches and weakness  Endocrine: negative for diabetic symptoms including polydipsia,  polyuria and weight loss     Objective:   Physical Exam  Gen. Pleasant, elderly,well-nourished, in no distress, normal affect ENT - no lesions, no post nasal drip Neck: No JVD, no thyromegaly, no carotid bruits Lungs: no use of accessory muscles, no dullness to percussion, clear without rales or rhonchi  Cardiovascular: Rhythm regular, heart sounds  normal, no murmurs or gallops, no peripheral edema Abdomen: soft and non-tender, no hepatosplenomegaly, BS normal. Musculoskeletal: No deformities, no cyanosis or clubbing Neuro:  alert, non focal       Assessment & Plan:

## 2017-12-29 NOTE — Assessment & Plan Note (Signed)
She does not seem to have enough amiodarone pills after her recent increase in dose, have asked her cardiologist to refill

## 2017-12-29 NOTE — Telephone Encounter (Signed)
Renee, can you please assist with this?

## 2017-12-29 NOTE — Patient Instructions (Signed)
Dry cough may be related to viral bronchitis  Abnormal chest x-ray with related to fluid in the lungs which is now resolved  I have sent a note to your cardiologist to refill amiodarone

## 2017-12-29 NOTE — Telephone Encounter (Signed)
Her amiodarone dose was increased during her last hospitalization to 400 mg but prescription was never called in she needs medications called in today.  Please help her out

## 2017-12-30 MED ORDER — AMIODARONE HCL 200 MG PO TABS: 200.0000 mg | ORAL_TABLET | Freq: Every day | ORAL | 0 refills | Status: DC

## 2017-12-30 NOTE — Telephone Encounter (Signed)
I faxed script to [facility].

## 2018-01-02 ENCOUNTER — Encounter (HOSPITAL_COMMUNITY): Payer: Self-pay | Admitting: Nurse Practitioner

## 2018-01-02 ENCOUNTER — Ambulatory Visit (HOSPITAL_COMMUNITY)
Admit: 2018-01-02 | Discharge: 2018-01-02 | Disposition: A | Discharge status: Home / Self Care | Payer: Medicare Other | Attending: Nurse Practitioner | Admitting: Nurse Practitioner

## 2018-01-02 VITALS — BP 126/74 | HR 80 | Ht 67.0 in | Wt 146.0 lb

## 2018-01-02 DIAGNOSIS — Z7901 Long term (current) use of anticoagulants: Secondary | ICD-10-CM | POA: Diagnosis not present

## 2018-01-02 DIAGNOSIS — I481 Persistent atrial fibrillation: Secondary | ICD-10-CM

## 2018-01-02 DIAGNOSIS — I10 Essential (primary) hypertension: Secondary | ICD-10-CM | POA: Diagnosis not present

## 2018-01-02 DIAGNOSIS — G43909 Migraine, unspecified, not intractable, without status migrainosus: Secondary | ICD-10-CM | POA: Diagnosis not present

## 2018-01-02 DIAGNOSIS — Z79899 Other long term (current) drug therapy: Secondary | ICD-10-CM | POA: Diagnosis not present

## 2018-01-02 DIAGNOSIS — I4891 Unspecified atrial fibrillation: Secondary | ICD-10-CM | POA: Diagnosis not present

## 2018-01-02 DIAGNOSIS — I4819 Other persistent atrial fibrillation: Secondary | ICD-10-CM

## 2018-01-02 DIAGNOSIS — K219 Gastro-esophageal reflux disease without esophagitis: Secondary | ICD-10-CM | POA: Diagnosis not present

## 2018-01-02 DIAGNOSIS — Z7951 Long term (current) use of inhaled steroids: Secondary | ICD-10-CM | POA: Insufficient documentation

## 2018-01-02 DIAGNOSIS — Z96651 Presence of right artificial knee joint: Secondary | ICD-10-CM | POA: Insufficient documentation

## 2018-01-02 MED ORDER — AMIODARONE HCL 200 MG PO TABS: 200.0000 mg | ORAL_TABLET | Freq: Two times a day (BID) | ORAL | 3 refills | Status: DC

## 2018-01-02 NOTE — Progress Notes (Signed)
Primary Care Physician: Glenda Chroman, MD Referring Physician:[facility] hospital f/u EP: Lydia Graham is a 82 y.o. female with a h/o HTN,afib in the afib clinic for f/u of recent hospitalization for persistent afib.The patient was 1st noted to have AFib April 2019, this was associated with RVR, started on Toprol and Eliquis. She felt very fatigued, suspect 2/2 to her AFib (+/- the BB dosed at 125mg  BID) and underwent DCCV after 3 weeks of uninterrupted a/c on 12/19/17. This was successful to restore SR 65bpm, though had ERAF by 6/29 evening felt like she went back into AF.   12/22/17 she went to [facility] with CP, palpitations, weakness, and noted to be in AFib w/RVR 140's. In communication with Lydia Graham, 150mg  bolus of amiodarone was given followed by DCCV that resulted in SB 30's given 2 doses of atropine, eventually rates increased 60's and she was discharged with an Rx for amiodarone.  By phone notes that evening by symptoms she felt like she was back in AFib, in communication with the office instructed to increase her amiodarone to 200mg  BID and f/u as scheduled, though later her HR became elevated, feeling poorly came to the ER at [facility], was started on amiodarone gtt and admitted with trace pleural effusions started on lasix as well  She was rate controlled and diuresed with plans to retry DCCV on AAD tx..  EP was consulted given patient's interest in discussing ablation as possible treatment strategy. The patient is a very active lady and when in AFib she is significantly limited in her exertional capacity and generally just feels poorly in AF.  In d/w Lydia Graham, agreed with Lydia Graham' thoughts, to DCCV on amiodarone, plan to discharge home on PO amiodarone and plan early follow up with the AFib clinic, and discuss repeat DCCV and perhaps discussion on plans for ablation.  LFTs were added to her labs for baseline.   The patient underwent DCCV that was successful, though  unfortunately had ERAF only after 15 minutes.  Will discharge, and is instructed to take 400mg  amiodarone BID until seen in the AFib clinic next week.  The patient was seen and examined by Lydia Graham and felt stable to discharge to home.  Continue her home meds otherwise, with a reduction in her metoprolol succ. to 100mg  daily.  She will continue her lasix and K+ once daily as instructed by Lydia Graham.   Today, she denies symptoms of palpitations, chest pain, shortness of breath, orthopnea, PND, lower extremity edema, dizziness, presyncope, syncope, or neurologic sequela. The patient is tolerating medications without difficulties and is otherwise without complaint today.   Past Medical History:  Diagnosis Date  . Arthritis    oa, all over - multiple areas   . Dysrhythmia    AFib   . GERD (gastroesophageal reflux disease)   . Headache    h/o migraines   . Hypertension    Past Surgical History:  Procedure Laterality Date  . ABDOMINAL HYSTERECTOMY    . APPENDECTOMY    . BACK SURGERY    . CARDIOVERSION N/A 12/19/2017   Procedure: CARDIOVERSION;  Surgeon: Arnoldo Lenis, MD;  [facility];  Service: Endoscopy;  Laterality: N/A;  . CARDIOVERSION N/A 12/26/2017   Procedure: CARDIOVERSION;  Surgeon: [name], MD;  Location: [facility] ENDOSCOPY;  Service: Cardiovascular;  Laterality: N/A;  . ENDARTERECTOMY Left 03/27/2017   Procedure: ENDARTERECTOMY CAROTID LEFT;  Surgeon: [name], MD;  Location: [place];  Service: Vascular;  Laterality: Left;  . INNER EAR SURGERY Right    puntured eardrum- repaired 2x's   . NASAL SINUS SURGERY     deviated septum  . PATCH ANGIOPLASTY Left 03/27/2017   Procedure: PATCH ANGIOPLASTY LEFT CAROTID ARTERY USING Rueben Bash BIOLOGIC PATCH;  Surgeon: [name], MD;  Location: [place];  Service: Vascular;  Laterality: Left;  . TONSILLECTOMY    . TOTAL KNEE ARTHROPLASTY Right 10/23/2016   Procedure: RIGHT TOTAL KNEE ARTHROPLASTY;   Surgeon: [name], MD;  Location: [place];  Service: Orthopedics;  Laterality: Right;    Current Outpatient Medications  Medication Sig Dispense Refill  . apixaban (ELIQUIS) 5 MG TABS tablet Take 1 tablet (5 mg total) by mouth 2 (two) times daily. 56 tablet 0  . Calcium Carbonate-Vitamin D (CALCIUM 600+D PO) Take 1 tablet by mouth daily.    Marland Kitchen estradiol (ESTRACE) 0.5 MG tablet Take 0.5 mg by mouth daily.     . fluticasone (FLONASE) 50 MCG/ACT nasal spray Place 1 spray into both nostrils daily as needed for allergies.     . furosemide (LASIX) 40 MG tablet Take 40 mg by mouth daily.    Marland Kitchen losartan (COZAAR) 50 MG tablet Take 50 mg by mouth daily with lunch.    . magnesium hydroxide (MILK OF MAGNESIA) 400 MG/5ML suspension Take 30 mLs by mouth daily as needed for mild constipation.    . metoprolol succinate (TOPROL XL) 100 MG 24 hr tablet Take 1 tablet (100 mg total) by mouth daily. Take with or immediately following a meal. 30 tablet 3  . pantoprazole (PROTONIX) 40 MG tablet Take 1 tablet (40 mg total) by mouth 2 (two) times daily. 60 tablet 3  . potassium chloride SA (K-DUR,KLOR-CON) 20 MEQ tablet Take 20 mEq by mouth daily.    . vitamin B-12 (CYANOCOBALAMIN) 1000 MCG tablet Take 1,000 mcg by mouth daily.    Marland Kitchen amiodarone (PACERONE) 200 MG tablet Take 1 tablet (200 mg total) by mouth 2 (two) times daily. 60 tablet 3   No current facility-administered medications for this encounter.     Allergies  Allergen Reactions  . Rosuvastatin Other (See Comments)    MYALGIA's Muscle pain  . Lisinopril     Cough   . Other     PAIN MEDICATIONS/ANESTHESIA  MADE BP DROP LOW   . Singulair [Montelukast Sodium]     fatigue    Social History   Socioeconomic History  . Marital status: Single    Spouse name: Not on file  . Number of children: Not on file  . Years of education: Not on file  . Highest education level: Not on file  Occupational History  . Not on file  Social Needs  . Financial  resource strain: Not on file  . Food insecurity:    Worry: Not on file    Inability: Not on file  . Transportation needs:    Medical: Not on file    Non-medical: Not on file  Tobacco Use  . Smoking status: Never Smoker  . Smokeless tobacco: Never Used  Substance and Sexual Activity  . Alcohol use: No  . Drug use: No  . Sexual activity: Not Currently    Birth control/protection: Surgical  Lifestyle  . Physical activity:    Days per week: Not on file    Minutes per session: Not on file  . Stress: Not on file  Relationships  . Social connections:    Talks on phone: Not  on file    Gets together: Not on file    Attends religious service: Not on file    Active member of club or organization: Not on file    Attends meetings of clubs or organizations: Not on file    Relationship status: Not on file  . Intimate partner violence:    Fear of current or ex partner: Not on file    Emotionally abused: Not on file    Physically abused: Not on file    Forced sexual activity: Not on file  Other Topics Concern  . Not on file  Social History Narrative  . Not on file    Family History  Problem Relation Age of Onset  . Stroke Mother     ROS- All systems are reviewed and negative except as per the HPI above  Physical Exam: Vitals:   01/02/18 0921  BP: 126/74  Pulse: 80  Weight: 146 lb (66.2 kg)  Height: 5\' 7"  (1.702 m)   Wt Readings from Last 3 Encounters:  01/02/18 146 lb (66.2 kg)  12/29/17 147 lb (66.7 kg)  12/26/17 143 lb (64.9 kg)    Labs: Lab Results  Component Value Date   NA 134 (L) 12/24/2017   K 3.7 12/24/2017   CL 101 12/24/2017   CO2 25 12/24/2017   GLUCOSE 110 (H) 12/24/2017   BUN 13 12/24/2017   CREATININE 1.08 (H) 12/24/2017   CALCIUM 8.9 12/24/2017   MG 1.9 12/22/2017   Lab Results  Component Value Date   INR 1.09 03/25/2017   No results found for: CHOL, HDL, LDLCALC, TRIG   GEN- The patient is well appearing, alert and oriented x 3 today.     Head- normocephalic, atraumatic Eyes-  Sclera clear, conjunctiva pink Ears- hearing intact Oropharynx- clear Neck- supple, no JVP Lymph- no cervical lymphadenopathy Lungs- Clear to ausculation bilaterally, normal work of breathing Heart- irregular rate and rhythm, no murmurs, rubs or gallops, PMI not laterally displaced GI- soft, NT, ND, + BS Extremities- no clubbing, cyanosis, or edema MS- no significant deformity or atrophy Skin- no rash or lesion Psych- euthymic mood, full affect Neuro- strength and sensation are intact  EKG-  Afib at 80 bpm, LAD, LBBB    Assessment and Plan: 1.Persisitent afib Now loading on amiodarone to pursue cardioversion at a later date when full loaded Lower amiodarone dose from 400 mg bid to 200 mg bid  Will see back in 2 weeks to further discuss/schedule for cardioversion She is feeling better, fluid status stable She would like to pursue ablation at a later date after resuming SR Will schedule appointment in August with Lydia Graham Continue metoprolol succinate at 100 mg bid Continue  Eliquis 5 mg bid for CHA2DS2VASc score of at least 4, reminded not to miss doses  Lydia Graham, [facility] [address] [place], [place] [address] [phone]

## 2018-01-02 NOTE — Patient Instructions (Addendum)
Decrease Amiodarone to 200 mg twice a day.  A new Rx has been sent to pharmacy.  Follow up in 2 weeks with Roderic Palau, NP

## 2018-01-09 ENCOUNTER — Ambulatory Visit: Payer: Medicare Other | Admitting: Physician Assistant

## 2018-01-14 DIAGNOSIS — Z1231 Encounter for screening mammogram for malignant neoplasm of breast: Secondary | ICD-10-CM | POA: Diagnosis not present

## 2018-01-19 ENCOUNTER — Encounter (HOSPITAL_COMMUNITY): Payer: Self-pay | Admitting: Nurse Practitioner

## 2018-01-19 ENCOUNTER — Ambulatory Visit (HOSPITAL_COMMUNITY)
Admission: RE | Admit: 2018-01-19 | Discharge: 2018-01-19 | Disposition: A | Discharge status: Home / Self Care | Payer: Medicare Other | Source: Ambulatory Visit | Attending: Nurse Practitioner | Admitting: Nurse Practitioner

## 2018-01-19 VITALS — BP 122/80 | HR 86 | Ht 67.0 in | Wt 143.0 lb

## 2018-01-19 DIAGNOSIS — Z823 Family history of stroke: Secondary | ICD-10-CM | POA: Insufficient documentation

## 2018-01-19 DIAGNOSIS — I10 Essential (primary) hypertension: Secondary | ICD-10-CM | POA: Insufficient documentation

## 2018-01-19 DIAGNOSIS — Z79899 Other long term (current) drug therapy: Secondary | ICD-10-CM | POA: Insufficient documentation

## 2018-01-19 DIAGNOSIS — K219 Gastro-esophageal reflux disease without esophagitis: Secondary | ICD-10-CM | POA: Diagnosis not present

## 2018-01-19 DIAGNOSIS — Z96651 Presence of right artificial knee joint: Secondary | ICD-10-CM | POA: Diagnosis not present

## 2018-01-19 DIAGNOSIS — I447 Left bundle-branch block, unspecified: Secondary | ICD-10-CM | POA: Insufficient documentation

## 2018-01-19 DIAGNOSIS — Z7901 Long term (current) use of anticoagulants: Secondary | ICD-10-CM | POA: Diagnosis not present

## 2018-01-19 DIAGNOSIS — Z888 Allergy status to other drugs, medicaments and biological substances status: Secondary | ICD-10-CM | POA: Insufficient documentation

## 2018-01-19 DIAGNOSIS — I481 Persistent atrial fibrillation: Secondary | ICD-10-CM | POA: Diagnosis not present

## 2018-01-19 DIAGNOSIS — I4819 Other persistent atrial fibrillation: Secondary | ICD-10-CM

## 2018-01-19 LAB — BASIC METABOLIC PANEL
Anion gap: 13 (ref 5–15)
BUN: 19 mg/dL (ref 8–23)
CHLORIDE: 97 mmol/L — AB (ref 98–111)
CO2: 22 mmol/L (ref 22–32)
Calcium: 9.2 mg/dL (ref 8.9–10.3)
Creatinine, Ser: 1.3 mg/dL — ABNORMAL HIGH (ref 0.44–1.00)
GFR calc Af Amer: 44 mL/min — ABNORMAL LOW (ref >60)
GFR calc non Af Amer: 38 mL/min — ABNORMAL LOW (ref >60)
Glucose, Bld: 94 mg/dL (ref 70–99)
POTASSIUM: 4.3 mmol/L (ref 3.5–5.1)
Sodium: 132 mmol/L — ABNORMAL LOW (ref 135–145)

## 2018-01-19 LAB — CBC
HEMATOCRIT: 46.1 % — AB (ref 36.0–46.0)
HEMOGLOBIN: 15.4 g/dL — AB (ref 12.0–15.0)
MCH: 30.4 pg (ref 26.0–34.0)
MCHC: 33.4 g/dL (ref 30.0–36.0)
MCV: 90.9 fL (ref 78.0–100.0)
Platelets: 252 10*3/uL (ref 150–400)
RBC: 5.07 MIL/uL (ref 3.87–5.11)
RDW: 13.2 % (ref 11.5–15.5)
WBC: 8.4 10*3/uL (ref 4.0–10.5)

## 2018-01-19 NOTE — Progress Notes (Signed)
Primary Care Physician: Glenda Chroman, MD Referring Physician:[facility] hospital f/u EP: Lydia Graham is a 82 y.o. female with a h/o HTN,afib in the afib clinic for f/u of recent hospitalization for persistent afib.The patient was 1st noted to have AFib April 2019, this was associated with RVR, started on Toprol and Eliquis. She felt very fatigued, suspect 2/2 to her AFib (+/- the BB dosed at 125mg  BID) and underwent DCCV after 3 weeks of uninterrupted a/c on 12/19/17. This was successful to restore SR 65bpm, though had ERAF by 6/29 evening felt like she went back into AF.   12/22/17 she went to [facility] with CP, palpitations, weakness, and noted to be in AFib w/RVR 140's. In communication with Dr. Bronson Ing, 150mg  bolus of amiodarone was given followed by DCCV that resulted in SB 30's given 2 doses of atropine, eventually rates increased 60's and she was discharged with an Rx for amiodarone.  By phone notes that evening by symptoms she felt like she was back in AFib, in communication with the office instructed to increase her amiodarone to 200mg  BID and f/u as scheduled, though later her HR became elevated, feeling poorly came to the ER at [facility], was started on amiodarone gtt and admitted with trace pleural effusions started on lasix as well  She was rate controlled and diuresed with plans to retry DCCV on AAD tx..  EP was consulted given patient's interest in discussing ablation as possible treatment strategy. The patient is a very active lady and when in AFib she is significantly limited in her exertional capacity and generally just feels poorly in AF.  In d/w [name], agreed with Dr. Marlou Porch' thoughts, to DCCV on amiodarone, plan to discharge home on PO amiodarone and plan early follow up with the AFib clinic, and discuss repeat DCCV and perhaps discussion on plans for ablation.  LFTs were added to her labs for baseline.   The patient underwent DCCV that was successful, though  unfortunately had ERAF only after 15 minutes. Went ahead with  discharge, and was instructed to take 400mg  amiodarone BID until seen in the AFib clinic next week.  The patient was seen and examined by Dr. Caryl Comes and felt stable to discharge to home.  Continue her home meds otherwise, with a reduction in her metoprolol succ. to 100mg  daily.  She will continue her lasix and K+ once daily as instructed by Dr. Caryl Comes.  F/u in afib clinic, 7/29 and continues with rate controlled afib. She feels improved. No unusual shortness of breath. Weight is down 3 lbs.   Today, she denies symptoms of palpitations, chest pain, shortness of breath, orthopnea, PND, lower extremity edema, dizziness, presyncope, syncope, or neurologic sequela. The patient is tolerating medications without difficulties and is otherwise without complaint today.   Past Medical History:  Diagnosis Date  . Arthritis    oa, all over - multiple areas   . Dysrhythmia    AFib   . GERD (gastroesophageal reflux disease)   . Headache    h/o migraines   . Hypertension    Past Surgical History:  Procedure Laterality Date  . ABDOMINAL HYSTERECTOMY    . APPENDECTOMY    . BACK SURGERY    . CARDIOVERSION N/A 12/19/2017   Procedure: CARDIOVERSION;  Surgeon: Arnoldo Lenis, MD;  [facility];  Service: Endoscopy;  Laterality: N/A;  . CARDIOVERSION N/A 12/26/2017   Procedure: CARDIOVERSION;  Surgeon: [name], MD;  Location: [facility] ENDOSCOPY;  Service: Cardiovascular;  Laterality: N/A;  . ENDARTERECTOMY Left 03/27/2017   Procedure: ENDARTERECTOMY CAROTID LEFT;  Surgeon: [name], MD;  Location: [place];  Service: Vascular;  Laterality: Left;  . INNER EAR SURGERY Right    puntured eardrum- repaired 2x's   . NASAL SINUS SURGERY     deviated septum  . PATCH ANGIOPLASTY Left 03/27/2017   Procedure: PATCH ANGIOPLASTY LEFT CAROTID ARTERY USING Rueben Bash BIOLOGIC PATCH;  Surgeon: [name], MD;  Location:  [place];  Service: Vascular;  Laterality: Left;  . TONSILLECTOMY    . TOTAL KNEE ARTHROPLASTY Right 10/23/2016   Procedure: RIGHT TOTAL KNEE ARTHROPLASTY;  Surgeon: [name], MD;  Location: [place];  Service: Orthopedics;  Laterality: Right;    Current Outpatient Medications  Medication Sig Dispense Refill  . amiodarone (PACERONE) 200 MG tablet Take 1 tablet (200 mg total) by mouth 2 (two) times daily. 60 tablet 3  . apixaban (ELIQUIS) 5 MG TABS tablet Take 1 tablet (5 mg total) by mouth 2 (two) times daily. 56 tablet 0  . Calcium Carbonate-Vitamin D (CALCIUM 600+D PO) Take 1 tablet by mouth daily.    Marland Kitchen estradiol (ESTRACE) 0.5 MG tablet Take 0.5 mg by mouth daily.     . fluticasone (FLONASE) 50 MCG/ACT nasal spray Place 1 spray into both nostrils daily as needed for allergies.     . furosemide (LASIX) 40 MG tablet Take 40 mg by mouth daily.    Marland Kitchen losartan (COZAAR) 50 MG tablet Take 50 mg by mouth daily with lunch.    . magnesium hydroxide (MILK OF MAGNESIA) 400 MG/5ML suspension Take 30 mLs by mouth daily as needed for mild constipation.    . metoprolol succinate (TOPROL XL) 100 MG 24 hr tablet Take 1 tablet (100 mg total) by mouth daily. Take with or immediately following a meal. 30 tablet 3  . pantoprazole (PROTONIX) 40 MG tablet Take 1 tablet (40 mg total) by mouth 2 (two) times daily. 60 tablet 3  . potassium chloride SA (K-DUR,KLOR-CON) 20 MEQ tablet Take 20 mEq by mouth daily.    . vitamin B-12 (CYANOCOBALAMIN) 1000 MCG tablet Take 1,000 mcg by mouth daily.     No current facility-administered medications for this encounter.     Allergies  Allergen Reactions  . Rosuvastatin Other (See Comments)    MYALGIA's Muscle pain  . Lisinopril     Cough   . Other     PAIN MEDICATIONS/ANESTHESIA  MADE BP DROP LOW   . Singulair [Montelukast Sodium]     fatigue    Social History   Socioeconomic History  . Marital status: Single    Spouse name: Not on file  . Number of children: Not  on file  . Years of education: Not on file  . Highest education level: Not on file  Occupational History  . Not on file  Social Needs  . Financial resource strain: Not on file  . Food insecurity:    Worry: Not on file    Inability: Not on file  . Transportation needs:    Medical: Not on file    Non-medical: Not on file  Tobacco Use  . Smoking status: Never Smoker  . Smokeless tobacco: Never Used  Substance and Sexual Activity  . Alcohol use: No  . Drug use: No  . Sexual activity: Not Currently    Birth control/protection: Surgical  Lifestyle  . Physical activity:    Days per week: Not on file  Minutes per session: Not on file  . Stress: Not on file  Relationships  . Social connections:    Talks on phone: Not on file    Gets together: Not on file    Attends religious service: Not on file    Active member of club or organization: Not on file    Attends meetings of clubs or organizations: Not on file    Relationship status: Not on file  . Intimate partner violence:    Fear of current or ex partner: Not on file    Emotionally abused: Not on file    Physically abused: Not on file    Forced sexual activity: Not on file  Other Topics Concern  . Not on file  Social History Narrative  . Not on file    Family History  Problem Relation Age of Onset  . Stroke Mother     ROS- All systems are reviewed and negative except as per the HPI above  Physical Exam: Vitals:   01/19/18 1126  BP: 122/80  Pulse: 86  Weight: 143 lb (64.9 kg)  Height: 5\' 7"  (1.702 m)   Wt Readings from Last 3 Encounters:  01/19/18 143 lb (64.9 kg)  01/02/18 146 lb (66.2 kg)  12/29/17 147 lb (66.7 kg)    Labs: Lab Results  Component Value Date   NA 132 (L) 01/19/2018   K 4.3 01/19/2018   CL 97 (L) 01/19/2018   CO2 22 01/19/2018   GLUCOSE 94 01/19/2018   BUN 19 01/19/2018   CREATININE 1.30 (H) 01/19/2018   CALCIUM 9.2 01/19/2018   MG 1.9 12/22/2017   Lab Results  Component Value  Date   INR 1.09 03/25/2017   No results found for: CHOL, HDL, LDLCALC, TRIG   GEN- The patient is well appearing, alert and oriented x 3 today.   Head- normocephalic, atraumatic Eyes-  Sclera clear, conjunctiva pink Ears- hearing intact Oropharynx- clear Neck- supple, no JVP Lymph- no cervical lymphadenopathy Lungs- Clear to ausculation bilaterally, normal work of breathing Heart- irregular rate and rhythm, no murmurs, rubs or gallops, PMI not laterally displaced GI- soft, NT, ND, + BS Extremities- no clubbing, cyanosis, or edema MS- no significant deformity or atrophy Skin- no rash or lesion Psych- euthymic mood, full affect Neuro- strength and sensation are intact  EKG-  Afib at 86 bpm, LAD, LBBB    Assessment and Plan: 1. Persisitent afib Now loading on po amiodarone long enough to schedule for cardioversion. Set up for 8/5, will be one month as that point Continue  amiodarone 200 mg bid, will reduce to one daily at time of f/u She is feeling better, fluid status stable She would like to pursue ablation at a later date after resuming SR Will schedule appointment in September with [name]  Continue metoprolol succinate at 100 mg, 1/2 tab daily, this may need to be reduced if she has significant brady on return to SR Continue  Eliquis 5 mg bid for CHA2DS2VASc score of at least 4, reminded not to miss doses  F/u here in one week after cardioversion  Lydia Graham, [facility] [address] [place], [place] [address] [phone]

## 2018-01-19 NOTE — H&P (View-Only) (Signed)
Primary Care Physician: Glenda Chroman, MD Referring Physician:[facility] hospital f/u EP: Dr. Isaac Laud is a 82 y.o. female with a h/o HTN,afib in the afib clinic for f/u of recent hospitalization for persistent afib.The patient was 1st noted to have AFib April 2019, this was associated with RVR, started on Toprol and Eliquis. She felt very fatigued, suspect 2/2 to her AFib (+/- the BB dosed at 125mg  BID) and underwent DCCV after 3 weeks of uninterrupted a/c on 12/19/17. This was successful to restore SR 65bpm, though had ERAF by 6/29 evening felt like she went back into AF.   12/22/17 she went to [facility] with CP, palpitations, weakness, and noted to be in AFib w/RVR 140's. In communication with Dr. Bronson Ing, 150mg  bolus of amiodarone was given followed by DCCV that resulted in SB 30's given 2 doses of atropine, eventually rates increased 60's and she was discharged with an Rx for amiodarone.  By phone notes that evening by symptoms she felt like she was back in AFib, in communication with the office instructed to increase her amiodarone to 200mg  BID and f/u as scheduled, though later her HR became elevated, feeling poorly came to the ER at [facility], was started on amiodarone gtt and admitted with trace pleural effusions started on lasix as well  She was rate controlled and diuresed with plans to retry DCCV on AAD tx..  EP was consulted given patient's interest in discussing ablation as possible treatment strategy. The patient is a very active lady and when in AFib she is significantly limited in her exertional capacity and generally just feels poorly in AF.  In d/w [name], agreed with Dr. Marlou Porch' thoughts, to DCCV on amiodarone, plan to discharge home on PO amiodarone and plan early follow up with the AFib clinic, and discuss repeat DCCV and perhaps discussion on plans for ablation.  LFTs were added to her labs for baseline.   The patient underwent DCCV that was successful, though  unfortunately had ERAF only after 15 minutes. Went ahead with  discharge, and was instructed to take 400mg  amiodarone BID until seen in the AFib clinic next week.  The patient was seen and examined by Dr. Caryl Comes and felt stable to discharge to home.  Continue her home meds otherwise, with a reduction in her metoprolol succ. to 100mg  daily.  She will continue her lasix and K+ once daily as instructed by Dr. Caryl Comes.  F/u in afib clinic, 7/29 and continues with rate controlled afib. She feels improved. No unusual shortness of breath. Weight is down 3 lbs.   Today, she denies symptoms of palpitations, chest pain, shortness of breath, orthopnea, PND, lower extremity edema, dizziness, presyncope, syncope, or neurologic sequela. The patient is tolerating medications without difficulties and is otherwise without complaint today.   Past Medical History:  Diagnosis Date  . Arthritis    oa, all over - multiple areas   . Dysrhythmia    AFib   . GERD (gastroesophageal reflux disease)   . Headache    h/o migraines   . Hypertension    Past Surgical History:  Procedure Laterality Date  . ABDOMINAL HYSTERECTOMY    . APPENDECTOMY    . BACK SURGERY    . CARDIOVERSION N/A 12/19/2017   Procedure: CARDIOVERSION;  Surgeon: Arnoldo Lenis, MD;  [facility]: [facility];  Service: Endoscopy;  Laterality: N/A;  . CARDIOVERSION N/A 12/26/2017   Procedure: CARDIOVERSION;  Surgeon: [name], MD;  Location: [facility];  Service: Cardiovascular;  Laterality: N/A;  . ENDARTERECTOMY Left 03/27/2017   Procedure: ENDARTERECTOMY CAROTID LEFT;  Surgeon: [name], MD;  Location: [facility];  Service: Vascular;  Laterality: Left;  . INNER EAR SURGERY Right    puntured eardrum- repaired 2x's   . NASAL SINUS SURGERY     deviated septum  . PATCH ANGIOPLASTY Left 03/27/2017   Procedure: PATCH ANGIOPLASTY LEFT CAROTID ARTERY USING Rueben Bash BIOLOGIC PATCH;  Surgeon: [name], MD;  Location:  [place];  Service: Vascular;  Laterality: Left;  . TONSILLECTOMY    . TOTAL KNEE ARTHROPLASTY Right 10/23/2016   Procedure: RIGHT TOTAL KNEE ARTHROPLASTY;  Surgeon: [name], MD;  Location: [place];  Service: Orthopedics;  Laterality: Right;    Current Outpatient Medications  Medication Sig Dispense Refill  . amiodarone (PACERONE) 200 MG tablet Take 1 tablet (200 mg total) by mouth 2 (two) times daily. 60 tablet 3  . apixaban (ELIQUIS) 5 MG TABS tablet Take 1 tablet (5 mg total) by mouth 2 (two) times daily. 56 tablet 0  . Calcium Carbonate-Vitamin D (CALCIUM 600+D PO) Take 1 tablet by mouth daily.    Marland Kitchen estradiol (ESTRACE) 0.5 MG tablet Take 0.5 mg by mouth daily.     . fluticasone (FLONASE) 50 MCG/ACT nasal spray Place 1 spray into both nostrils daily as needed for allergies.     . furosemide (LASIX) 40 MG tablet Take 40 mg by mouth daily.    Marland Kitchen losartan (COZAAR) 50 MG tablet Take 50 mg by mouth daily with lunch.    . magnesium hydroxide (MILK OF MAGNESIA) 400 MG/5ML suspension Take 30 mLs by mouth daily as needed for mild constipation.    . metoprolol succinate (TOPROL XL) 100 MG 24 hr tablet Take 1 tablet (100 mg total) by mouth daily. Take with or immediately following a meal. 30 tablet 3  . pantoprazole (PROTONIX) 40 MG tablet Take 1 tablet (40 mg total) by mouth 2 (two) times daily. 60 tablet 3  . potassium chloride SA (K-DUR,KLOR-CON) 20 MEQ tablet Take 20 mEq by mouth daily.    . vitamin B-12 (CYANOCOBALAMIN) 1000 MCG tablet Take 1,000 mcg by mouth daily.     No current facility-administered medications for this encounter.     Allergies  Allergen Reactions  . Rosuvastatin Other (See Comments)    MYALGIA's Muscle pain  . Lisinopril     Cough   . Other     PAIN MEDICATIONS/ANESTHESIA  MADE BP DROP LOW   . Singulair [Montelukast Sodium]     fatigue    Social History   Socioeconomic History  . Marital status: Single    Spouse name: Not on file  . Number of children: Not  on file  . Years of education: Not on file  . Highest education level: Not on file  Occupational History  . Not on file  Social Needs  . Financial resource strain: Not on file  . Food insecurity:    Worry: Not on file    Inability: Not on file  . Transportation needs:    Medical: Not on file    Non-medical: Not on file  Tobacco Use  . Smoking status: Never Smoker  . Smokeless tobacco: Never Used  Substance and Sexual Activity  . Alcohol use: No  . Drug use: No  . Sexual activity: Not Currently    Birth control/protection: Surgical  Lifestyle  . Physical activity:    Days per week: Not on file  Minutes per session: Not on file  . Stress: Not on file  Relationships  . Social connections:    Talks on phone: Not on file    Gets together: Not on file    Attends religious service: Not on file    Active member of club or organization: Not on file    Attends meetings of clubs or organizations: Not on file    Relationship status: Not on file  . Intimate partner violence:    Fear of current or ex partner: Not on file    Emotionally abused: Not on file    Physically abused: Not on file    Forced sexual activity: Not on file  Other Topics Concern  . Not on file  Social History Narrative  . Not on file    Family History  Problem Relation Age of Onset  . Stroke Mother     ROS- All systems are reviewed and negative except as per the HPI above  Physical Exam: Vitals:   01/19/18 1126  BP: 122/80  Pulse: 86  Weight: 143 lb (64.9 kg)  Height: 5\' 7"  (1.702 m)   Wt Readings from Last 3 Encounters:  01/19/18 143 lb (64.9 kg)  01/02/18 146 lb (66.2 kg)  12/29/17 147 lb (66.7 kg)    Labs: Lab Results  Component Value Date   NA 132 (L) 01/19/2018   K 4.3 01/19/2018   CL 97 (L) 01/19/2018   CO2 22 01/19/2018   GLUCOSE 94 01/19/2018   BUN 19 01/19/2018   CREATININE 1.30 (H) 01/19/2018   CALCIUM 9.2 01/19/2018   MG 1.9 12/22/2017   Lab Results  Component Value  Date   INR 1.09 03/25/2017   No results found for: CHOL, HDL, LDLCALC, TRIG   GEN- The patient is well appearing, alert and oriented x 3 today.   Head- normocephalic, atraumatic Eyes-  Sclera clear, conjunctiva pink Ears- hearing intact Oropharynx- clear Neck- supple, no JVP Lymph- no cervical lymphadenopathy Lungs- Clear to ausculation bilaterally, normal work of breathing Heart- irregular rate and rhythm, no murmurs, rubs or gallops, PMI not laterally displaced GI- soft, NT, ND, + BS Extremities- no clubbing, cyanosis, or edema MS- no significant deformity or atrophy Skin- no rash or lesion Psych- euthymic mood, full affect Neuro- strength and sensation are intact  EKG-  Afib at 86 bpm, LAD, LBBB    Assessment and Plan: 1. Persisitent afib Now loading on po amiodarone long enough to schedule for cardioversion. Set up for 8/5, will be one month as that point Continue  amiodarone 200 mg bid, will reduce to one daily at time of f/u She is feeling better, fluid status stable She would like to pursue ablation at a later date after resuming SR Will schedule appointment in September with [name]  Continue metoprolol succinate at 100 mg, 1/2 tab daily, this may need to be reduced if she has significant brady on return to SR Continue  Eliquis 5 mg bid for CHA2DS2VASc score of at least 4, reminded not to miss doses  F/u here in one week after cardioversion  Butch Penny C. Ahnesty Finfrock, [facility] [address] [place], [place] [address] [phone]

## 2018-01-19 NOTE — Patient Instructions (Signed)
Cardioversion scheduled for Monday, August 5th  - Arrive at the [facility] and go to admitting at [facility] not eat or drink anything after midnight the night prior to your procedure.  - Take all your morning medication with a sip of water prior to arrival.  - You will not be able to drive home after your procedure.  Continue amiodarone twice a day until follow up.

## 2018-01-22 ENCOUNTER — Other Ambulatory Visit (HOSPITAL_COMMUNITY): Payer: Self-pay | Admitting: *Deleted

## 2018-01-25 NOTE — Anesthesia Preprocedure Evaluation (Addendum)
Anesthesia Evaluation  Patient identified by MRN, date of birth, ID band Patient awake    Reviewed: Allergy & Precautions, NPO status , Patient's Chart, lab work & pertinent test results  Airway Mallampati: III  TM Distance: >3 FB Neck ROM: Full    Dental no notable dental hx. (+) Teeth Intact, Dental Advisory Given   Pulmonary neg pulmonary ROS,    Pulmonary exam normal breath sounds clear to auscultation       Cardiovascular hypertension, Pt. on medications and Pt. on home beta blockers + Peripheral Vascular Disease  Normal cardiovascular exam+ dysrhythmias Atrial Fibrillation  Rhythm:Regular Rate:Normal     Neuro/Psych  Headaches, negative psych ROS   GI/Hepatic Neg liver ROS, GERD  Medicated and Controlled,  Endo/Other  negative endocrine ROS  Renal/GU negative Renal ROS     Musculoskeletal negative musculoskeletal ROS (+)   Abdominal   Peds  Hematology negative hematology ROS (+)   Anesthesia Other Findings AFIB wityh RVR  Reproductive/Obstetrics                             Anesthesia Physical Anesthesia Plan  ASA: III  Anesthesia Plan: General   Post-op Pain Management:    Induction:   PONV Risk Score and Plan: Treatment may vary due to age or medical condition  Airway Management Planned: Mask  Additional Equipment:   Intra-op Plan:   Post-operative Plan:   Informed Consent: I have reviewed the patients History and Physical, chart, labs and discussed the procedure including the risks, benefits and alternatives for the proposed anesthesia with the patient or authorized representative who has indicated his/her understanding and acceptance.   Dental advisory given  Plan Discussed with:   Anesthesia Plan Comments:         Anesthesia Quick Evaluation

## 2018-01-26 ENCOUNTER — Ambulatory Visit (HOSPITAL_COMMUNITY): Payer: Medicare Other | Admitting: Anesthesiology

## 2018-01-26 ENCOUNTER — Other Ambulatory Visit: Payer: Self-pay

## 2018-01-26 ENCOUNTER — Encounter (HOSPITAL_COMMUNITY)
Admission: RE | Disposition: A | Discharge status: Home / Self Care | Payer: Self-pay | Source: Ambulatory Visit | Attending: Cardiology

## 2018-01-26 ENCOUNTER — Ambulatory Visit (HOSPITAL_COMMUNITY)
Admission: RE | Admit: 2018-01-26 | Discharge: 2018-01-26 | Disposition: A | Discharge status: Home / Self Care | Payer: Medicare Other | Source: Ambulatory Visit | Attending: Cardiology | Admitting: Cardiology

## 2018-01-26 ENCOUNTER — Encounter (HOSPITAL_COMMUNITY): Payer: Self-pay | Admitting: *Deleted

## 2018-01-26 DIAGNOSIS — Z7901 Long term (current) use of anticoagulants: Secondary | ICD-10-CM | POA: Diagnosis not present

## 2018-01-26 DIAGNOSIS — Z9889 Other specified postprocedural states: Secondary | ICD-10-CM | POA: Diagnosis not present

## 2018-01-26 DIAGNOSIS — Z90710 Acquired absence of both cervix and uterus: Secondary | ICD-10-CM | POA: Diagnosis not present

## 2018-01-26 DIAGNOSIS — M1990 Unspecified osteoarthritis, unspecified site: Secondary | ICD-10-CM | POA: Diagnosis not present

## 2018-01-26 DIAGNOSIS — I480 Paroxysmal atrial fibrillation: Secondary | ICD-10-CM

## 2018-01-26 DIAGNOSIS — Z96651 Presence of right artificial knee joint: Secondary | ICD-10-CM | POA: Insufficient documentation

## 2018-01-26 DIAGNOSIS — Z823 Family history of stroke: Secondary | ICD-10-CM | POA: Insufficient documentation

## 2018-01-26 DIAGNOSIS — Z888 Allergy status to other drugs, medicaments and biological substances status: Secondary | ICD-10-CM | POA: Diagnosis not present

## 2018-01-26 DIAGNOSIS — K219 Gastro-esophageal reflux disease without esophagitis: Secondary | ICD-10-CM | POA: Diagnosis not present

## 2018-01-26 DIAGNOSIS — Z79899 Other long term (current) drug therapy: Secondary | ICD-10-CM | POA: Diagnosis not present

## 2018-01-26 DIAGNOSIS — I429 Cardiomyopathy, unspecified: Secondary | ICD-10-CM | POA: Insufficient documentation

## 2018-01-26 DIAGNOSIS — I110 Hypertensive heart disease with heart failure: Secondary | ICD-10-CM | POA: Diagnosis not present

## 2018-01-26 DIAGNOSIS — I481 Persistent atrial fibrillation: Secondary | ICD-10-CM | POA: Insufficient documentation

## 2018-01-26 DIAGNOSIS — I10 Essential (primary) hypertension: Secondary | ICD-10-CM | POA: Insufficient documentation

## 2018-01-26 DIAGNOSIS — G43909 Migraine, unspecified, not intractable, without status migrainosus: Secondary | ICD-10-CM | POA: Insufficient documentation

## 2018-01-26 DIAGNOSIS — I4891 Unspecified atrial fibrillation: Secondary | ICD-10-CM | POA: Diagnosis not present

## 2018-01-26 DIAGNOSIS — I5043 Acute on chronic combined systolic (congestive) and diastolic (congestive) heart failure: Secondary | ICD-10-CM | POA: Diagnosis not present

## 2018-01-26 HISTORY — PX: CARDIOVERSION: SHX1299

## 2018-01-26 SURGERY — CARDIOVERSION
Anesthesia: General

## 2018-01-26 MED ORDER — LIDOCAINE 2% (20 MG/ML) 5 ML SYRINGE
INTRAMUSCULAR | Status: DC | PRN
  Administered 2018-01-26: 100 mg via INTRAVENOUS

## 2018-01-26 MED ORDER — SODIUM CHLORIDE 0.9 % IV SOLN
INTRAVENOUS | Status: AC | PRN
  Administered 2018-01-26: 500 mL via INTRAVENOUS

## 2018-01-26 MED ORDER — PROPOFOL 10 MG/ML IV BOLUS
INTRAVENOUS | Status: DC | PRN
  Administered 2018-01-26: 60 mg via INTRAVENOUS

## 2018-01-26 NOTE — Anesthesia Procedure Notes (Signed)
Procedure Name: MAC Date/Time: 01/26/2018 11:34 AM Performed by: Teressa Lower., CRNA Pre-anesthesia Checklist: Patient identified, Emergency Drugs available, Suction available, Patient being monitored and Timeout performed Patient Re-evaluated:Patient Re-evaluated prior to induction Oxygen Delivery Method: Nasal cannula

## 2018-01-26 NOTE — Anesthesia Postprocedure Evaluation (Signed)
Anesthesia Post Note  Patient: [name]  Procedure(s) Performed: CARDIOVERSION (N/A )     Patient location during evaluation: Endoscopy Anesthesia Type: General Level of consciousness: awake and alert Pain management: pain level controlled Vital Signs Assessment: post-procedure vital signs reviewed and stable Respiratory status: spontaneous breathing, nonlabored ventilation, respiratory function stable and patient connected to nasal cannula oxygen Cardiovascular status: blood pressure returned to baseline and stable Postop Assessment: no apparent nausea or vomiting Anesthetic complications: no    Last Vitals:  Vitals:   01/26/18 1200 01/26/18 1205  BP: (!) 122/51 (!) 137/57  Pulse: (!) 56 (!) 53  Resp: 18 16  Temp:    SpO2: 96% 96%    Last Pain:  Vitals:   01/26/18 1205  TempSrc:   PainSc: 0-No pain                 Barnet Glasgow

## 2018-01-26 NOTE — Transfer of Care (Signed)
Immediate Anesthesia Transfer of Care Note  Patient: Lydia Graham  Procedure(s) Performed: CARDIOVERSION (N/A )  Patient Location: Endoscopy Unit  Anesthesia Type:General  Level of Consciousness: awake, alert  and oriented  Airway & Oxygen Therapy: Patient Spontanous Breathing and Patient connected to nasal cannula oxygen  Post-op Assessment: Report given to RN and Post -op Vital signs reviewed and stable  Post vital signs: Reviewed and stable  Last Vitals:  Vitals Value Taken Time  BP    Temp    Pulse    Resp    SpO2      Last Pain:  Vitals:   01/26/18 1115  TempSrc: Oral  PainSc: 0-No pain         Complications: No apparent anesthesia complications

## 2018-01-26 NOTE — Interval H&P Note (Signed)
History and Physical Interval Note:  01/26/2018 11:02 AM  [name]  has presented today for surgery, with the diagnosis of afib  The various methods of treatment have been discussed with the patient and family. After consideration of risks, benefits and other options for treatment, the patient has consented to  Procedure(s): CARDIOVERSION (N/A) as a surgical intervention .  The patient's history has been reviewed, patient examined, no change in status, stable for surgery.  I have reviewed the patient's chart and labs.  Questions were answered to the patient's satisfaction.     [employer]

## 2018-01-26 NOTE — Discharge Instructions (Signed)
Electrical Cardioversion, Care After °This sheet gives you information about how to care for yourself after your procedure. Your health care provider may also give you more specific instructions. If you have problems or questions, contact your health care provider. °What can I expect after the procedure? °After the procedure, it is common to have: °· Some redness on the skin where the shocks were given. ° °Follow these instructions at home: °· Do not drive for 24 hours if you were given a medicine to help you relax (sedative). °· Take over-the-counter and prescription medicines only as told by your health care provider. °· Ask your health care provider how to check your pulse. Check it often. °· Rest for 48 hours after the procedure or as told by your health care provider. °· Avoid or limit your caffeine use as told by your health care provider. °Contact a health care provider if: °· You feel like your heart is beating too quickly or your pulse is not regular. °· You have a serious muscle cramp that does not go away. °Get help right away if: °· You have discomfort in your chest. °· You are dizzy or you feel faint. °· You have trouble breathing or you are short of breath. °· Your speech is slurred. °· You have trouble moving an arm or leg on one side of your body. °· Your fingers or toes turn cold or blue. °This information is not intended to replace advice given to you by your health care provider. Make sure you discuss any questions you have with your health care provider. °Document Released: 03/31/2013 Document Revised: 01/12/2016 Document Reviewed: 12/15/2015 °Elsevier Interactive Patient Education © 2018 [employer] ° °

## 2018-01-26 NOTE — CV Procedure (Signed)
    Electrical Cardioversion Procedure Note Lydia Graham [id number] Jul 15, 1935  Procedure: Electrical Cardioversion Indications:  Atrial Fibrillation  Time Out: Verified patient identification, verified procedure,medications/allergies/relevent history reviewed, required imaging and test results available.  Performed  Procedure Details  The patient was NPO after midnight. Anesthesia was administered at the beside  by Dr. Valma Cava with 60mg  of propofol.  Cardioversion was performed with synchronized biphasic defibrillation via AP pads with 120, 150 joules.  2 attempt(s) were performed.  The patient converted to normal sinus rhythm. The patient tolerated the procedure well   IMPRESSION:  Successful cardioversion of atrial fibrillation. Continue AMIO 200 BID    Candee Furbish 01/26/2018, 11:45 AM

## 2018-02-02 ENCOUNTER — Telehealth: Payer: Self-pay | Admitting: Cardiovascular Disease

## 2018-02-02 ENCOUNTER — Encounter (HOSPITAL_COMMUNITY): Payer: Self-pay | Admitting: Nurse Practitioner

## 2018-02-02 ENCOUNTER — Ambulatory Visit (HOSPITAL_COMMUNITY)
Admission: RE | Admit: 2018-02-02 | Discharge: 2018-02-02 | Disposition: A | Discharge status: Home / Self Care | Payer: Medicare Other | Source: Ambulatory Visit | Attending: Nurse Practitioner | Admitting: Nurse Practitioner

## 2018-02-02 VITALS — BP 156/78 | HR 60 | Ht 67.0 in | Wt 142.0 lb

## 2018-02-02 DIAGNOSIS — I4891 Unspecified atrial fibrillation: Secondary | ICD-10-CM | POA: Insufficient documentation

## 2018-02-02 DIAGNOSIS — Z7901 Long term (current) use of anticoagulants: Secondary | ICD-10-CM | POA: Diagnosis not present

## 2018-02-02 DIAGNOSIS — I10 Essential (primary) hypertension: Secondary | ICD-10-CM | POA: Insufficient documentation

## 2018-02-02 DIAGNOSIS — I481 Persistent atrial fibrillation: Secondary | ICD-10-CM

## 2018-02-02 DIAGNOSIS — Z96651 Presence of right artificial knee joint: Secondary | ICD-10-CM | POA: Diagnosis not present

## 2018-02-02 DIAGNOSIS — Z888 Allergy status to other drugs, medicaments and biological substances status: Secondary | ICD-10-CM | POA: Diagnosis not present

## 2018-02-02 DIAGNOSIS — I4819 Other persistent atrial fibrillation: Secondary | ICD-10-CM

## 2018-02-02 DIAGNOSIS — K219 Gastro-esophageal reflux disease without esophagitis: Secondary | ICD-10-CM | POA: Insufficient documentation

## 2018-02-02 DIAGNOSIS — Z90710 Acquired absence of both cervix and uterus: Secondary | ICD-10-CM | POA: Diagnosis not present

## 2018-02-02 DIAGNOSIS — Z79899 Other long term (current) drug therapy: Secondary | ICD-10-CM | POA: Diagnosis not present

## 2018-02-02 MED ORDER — LOSARTAN POTASSIUM 50 MG PO TABS: 50.0000 mg | ORAL_TABLET | Freq: Two times a day (BID) | ORAL | 3 refills | Status: DC

## 2018-02-02 MED ORDER — AMIODARONE HCL 200 MG PO TABS: 200.0000 mg | ORAL_TABLET | Freq: Every day | ORAL | 3 refills | Status: DC

## 2018-02-02 NOTE — Progress Notes (Signed)
Primary Care Physician: Lydia Chroman, MD Referring Physician:[facility] hospital f/u EP: Lydia Graham is a 82 y.o. female with a h/o HTN,afib in the afib clinic for f/u of recent hospitalization for persistent afib.The patient was 1st noted to have AFib April 2019, this was associated with RVR, started on Toprol and Eliquis. She felt very fatigued, suspect 2/2 to her AFib (+/- the BB dosed at 125mg  BID) and underwent DCCV after 3 weeks of uninterrupted a/c on 12/19/17. This was successful to restore SR 65bpm, though had ERAF by 6/29 evening felt like she went back into AF.   12/22/17 she went to [facility] with CP, palpitations, weakness, and noted to be in AFib w/RVR 140's. In communication with Dr. Bronson Ing, 150mg  bolus of amiodarone was given followed by DCCV that resulted in SB 30's given 2 doses of atropine, eventually rates increased 60's and she was discharged with an Rx for amiodarone.  By phone notes that evening by symptoms she felt like she was back in AFib, in communication with the office instructed to increase her amiodarone to 200mg  BID and f/u as scheduled, though later her HR became elevated, feeling poorly came to the ER at [facility], was started on amiodarone gtt and admitted with trace pleural effusions started on lasix as well  She was rate controlled and diuresed with plans to retry DCCV on AAD tx..  EP was consulted given patient's interest in discussing ablation as possible treatment strategy. The patient is a very active lady and when in AFib she is significantly limited in her exertional capacity and generally just feels poorly in AF.  In d/w [name], agreed with Dr. Marlou Porch' thoughts, to DCCV on amiodarone, plan to discharge home on PO amiodarone and plan early follow up with the AFib clinic, and discuss repeat DCCV and perhaps discussion on plans for ablation.  LFTs were added to her labs for baseline.   The patient underwent DCCV that was successful, though  unfortunately had ERAF only after 15 minutes. Went ahead with  discharge, and was instructed to take 400mg  amiodarone BID until seen in the AFib clinic next week.  The patient was seen and examined by Dr. Caryl Comes and felt stable to discharge to home.  Continue her home meds otherwise, with a reduction in her metoprolol succ. to 100mg  daily.  She will continue her lasix and K+ once daily as instructed by Dr. Caryl Comes.  F/u in afib clinic, 7/29 and continues with rate controlled afib. She feels improved. No unusual shortness of breath. Weight is down 3 lbs.  F/u 8/12. Pt had successful cardioversion and returns to afib clinic in [place]. She feels improved. She will lower amiodarone to 200 mg qd today.Bp's have been running higher at home Losartan was reduced to once a day at 50 mg from BID when she had low BP with afib /RVR.    Today, she denies symptoms of palpitations, chest pain, shortness of breath, orthopnea, PND, lower extremity edema, dizziness, presyncope, syncope, or neurologic sequela. The patient is tolerating medications without difficulties and is otherwise without complaint today.   Past Medical History:  Diagnosis Date  . Arthritis    oa, all over - multiple areas   . Dysrhythmia    AFib   . GERD (gastroesophageal reflux disease)   . Headache    h/o migraines   . Hypertension    Past Surgical History:  Procedure Laterality Date  . ABDOMINAL HYSTERECTOMY    . APPENDECTOMY    .  BACK SURGERY    . CARDIOVERSION N/A 12/19/2017   Procedure: CARDIOVERSION;  Surgeon: Arnoldo Lenis, MD;  [facility];  Service: Endoscopy;  Laterality: N/A;  . CARDIOVERSION N/A 12/26/2017   Procedure: CARDIOVERSION;  Surgeon: [name], MD;  Location: [facility] ENDOSCOPY;  Service: Cardiovascular;  Laterality: N/A;  . CARDIOVERSION N/A 01/26/2018   Procedure: CARDIOVERSION;  Surgeon: Jerline Pain, MD;  Location: [facility] ENDOSCOPY;  Service: Cardiovascular;  Laterality: N/A;  . ENDARTERECTOMY Left  03/27/2017   Procedure: ENDARTERECTOMY CAROTID LEFT;  Surgeon: [name], MD;  Location: [place];  Service: Vascular;  Laterality: Left;  . INNER EAR SURGERY Right    puntured eardrum- repaired 2x's   . NASAL SINUS SURGERY     deviated septum  . PATCH ANGIOPLASTY Left 03/27/2017   Procedure: PATCH ANGIOPLASTY LEFT CAROTID ARTERY USING Rueben Bash BIOLOGIC PATCH;  Surgeon: [name], MD;  Location: [place];  Service: Vascular;  Laterality: Left;  . TONSILLECTOMY    . TOTAL KNEE ARTHROPLASTY Right 10/23/2016   Procedure: RIGHT TOTAL KNEE ARTHROPLASTY;  Surgeon: [name], MD;  Location: [place];  Service: Orthopedics;  Laterality: Right;    Current Outpatient Medications  Medication Sig Dispense Refill  . amiodarone (PACERONE) 200 MG tablet Take 1 tablet (200 mg total) by mouth daily. 60 tablet 3  . amLODipine (NORVASC) 5 MG tablet Take 5 mg by mouth daily with lunch.    Marland Kitchen apixaban (ELIQUIS) 5 MG TABS tablet Take 1 tablet (5 mg total) by mouth 2 (two) times daily. 56 tablet 0  . Calcium Carbonate-Vitamin D (CALCIUM 600+D PO) Take 1 tablet by mouth daily.    Marland Kitchen estradiol (ESTRACE) 0.5 MG tablet Take 0.5 mg by mouth daily.     . fluticasone (FLONASE) 50 MCG/ACT nasal spray Place 2 sprays into both nostrils daily as needed for allergies.     . furosemide (LASIX) 40 MG tablet Take 40 mg by mouth daily.    . Hypromellose (ARTIFICIAL TEARS OP) Place 2 drops into both eyes 2 (two) times daily as needed (for dry eyes).    Marland Kitchen losartan (COZAAR) 50 MG tablet Take 1 tablet (50 mg total) by mouth 2 (two) times daily. 60 tablet 3  . magnesium hydroxide (MILK OF MAGNESIA) 400 MG/5ML suspension Take 30 mLs by mouth daily as needed for mild constipation.    . metoprolol succinate (TOPROL-XL) 100 MG 24 hr tablet Take 50 mg by mouth daily. Take with or immediately following a meal.    . pantoprazole (PROTONIX) 40 MG tablet Take 1 tablet (40 mg total) by mouth 2 (two) times daily. 60 tablet  3  . potassium chloride SA (K-DUR,KLOR-CON) 20 MEQ tablet Take 20 mEq by mouth daily.    . vitamin B-12 (CYANOCOBALAMIN) 1000 MCG tablet Take 1,000 mcg by mouth daily.     No current facility-administered medications for this encounter.     Allergies  Allergen Reactions  . Rosuvastatin Other (See Comments)    MYALGIA, Muscle pain  . Lisinopril Cough  . Other Other (See Comments)    PAIN MEDICATIONS/ANESTHESIA  MADE BP DROP LOW   . Singulair [Montelukast Sodium] Other (See Comments)    fatigue    Social History   Socioeconomic History  . Marital status: Single    Spouse name: Not on file  . Number of children: Not on file  . Years of education: Not on file  . Highest education level: Not on file  Occupational  History  . Not on file  Social Needs  . Financial resource strain: Not on file  . Food insecurity:    Worry: Not on file    Inability: Not on file  . Transportation needs:    Medical: Not on file    Non-medical: Not on file  Tobacco Use  . Smoking status: Never Smoker  . Smokeless tobacco: Never Used  Substance and Sexual Activity  . Alcohol use: No  . Drug use: No  . Sexual activity: Not Currently    Birth control/protection: Surgical  Lifestyle  . Physical activity:    Days per week: Not on file    Minutes per session: Not on file  . Stress: Not on file  Relationships  . Social connections:    Talks on phone: Not on file    Gets together: Not on file    Attends religious service: Not on file    Active member of club or organization: Not on file    Attends meetings of clubs or organizations: Not on file    Relationship status: Not on file  . Intimate partner violence:    Fear of current or ex partner: Not on file    Emotionally abused: Not on file    Physically abused: Not on file    Forced sexual activity: Not on file  Other Topics Concern  . Not on file  Social History Narrative  . Not on file    Family History  Problem Relation Age of Onset   . Stroke Mother     ROS- All systems are reviewed and negative except as per the HPI above  Physical Exam: Vitals:   02/02/18 1037  BP: (!) 156/78  Pulse: 60  Weight: 64.4 kg  Height: 5\' 7"  (1.702 m)   Wt Readings from Last 3 Encounters:  02/02/18 64.4 kg  01/26/18 64.9 kg  01/19/18 64.9 kg    Labs: Lab Results  Component Value Date   NA 132 (L) 01/19/2018   K 4.3 01/19/2018   CL 97 (L) 01/19/2018   CO2 22 01/19/2018   GLUCOSE 94 01/19/2018   BUN 19 01/19/2018   CREATININE 1.30 (H) 01/19/2018   CALCIUM 9.2 01/19/2018   MG 1.9 12/22/2017   Lab Results  Component Value Date   INR 1.09 03/25/2017   No results found for: CHOL, HDL, LDLCALC, TRIG   GEN- The patient is well appearing, alert and oriented x 3 today.   Head- normocephalic, atraumatic Eyes-  Sclera clear, conjunctiva pink Ears- hearing intact Oropharynx- clear Neck- supple, no JVP Lymph- no cervical lymphadenopathy Lungs- Clear to ausculation bilaterally, normal work of breathing Heart- regular rate and rhythm, no murmurs, rubs or gallops, PMI not laterally displaced GI- soft, NT, ND, + BS Extremities- no clubbing, cyanosis, or edema MS- no significant deformity or atrophy Skin- no rash or lesion Psych- euthymic mood, full affect Neuro- strength and sensation are intact  EKG- NSR at 60 bpm, LAD, LBBB, pr int 188 ms, qrs int 126 ms, qtc 444 ms    Assessment and Plan: 1. Persisitent afib Successful cardioversion after loading on amiodarone and now will reduce   amiodarone to  200 mg qd  She would like to pursue ablation at a later date, she will talk to [name] about this on f/u Continue metoprolol succinate at 100 mg, 1/2 tab daily  Continue  Eliquis 5 mg bid for CHA2DS2VASc score of at least 4, reminded not to miss doses  2.  HTN BP is climbing now back in SR She will resume losartan 50 mg bid and f/u with Dr. Bronson Ing for effectiveness   F/u with [name] 9/6 Pt to call for f/u  with Dr. Melrose Nakayama C. Mushka Laconte, [facility] [address] [place], [place] [address] [phone]

## 2018-02-02 NOTE — Telephone Encounter (Signed)
That would be fine. I reviewed the office note from her a fib clinic visit and BP was high which warrants increasing losartan dose.

## 2018-02-02 NOTE — Telephone Encounter (Signed)
Patient informed. 

## 2018-02-02 NOTE — Patient Instructions (Signed)
Amiodarone to 200mg  once a day  Increase losartan to 50mg  twice a day

## 2018-02-02 NOTE — Telephone Encounter (Signed)
Pt's afib Dr. Increased her losartan (COZAAR) 50 MG tablet [[id number]]  And the pt wanted to make sure It was ok before she started taking it.

## 2018-02-24 ENCOUNTER — Other Ambulatory Visit: Payer: Self-pay | Admitting: Cardiovascular Disease

## 2018-02-25 DIAGNOSIS — L821 Other seborrheic keratosis: Secondary | ICD-10-CM | POA: Diagnosis not present

## 2018-02-25 DIAGNOSIS — I10 Essential (primary) hypertension: Secondary | ICD-10-CM | POA: Diagnosis not present

## 2018-02-25 DIAGNOSIS — Z6823 Body mass index (BMI) 23.0-23.9, adult: Secondary | ICD-10-CM | POA: Diagnosis not present

## 2018-02-25 DIAGNOSIS — Z299 Encounter for prophylactic measures, unspecified: Secondary | ICD-10-CM | POA: Diagnosis not present

## 2018-02-27 ENCOUNTER — Encounter: Payer: Self-pay | Admitting: Internal Medicine

## 2018-02-27 ENCOUNTER — Ambulatory Visit (INDEPENDENT_AMBULATORY_CARE_PROVIDER_SITE_OTHER): Payer: Medicare Other | Admitting: Internal Medicine

## 2018-02-27 VITALS — BP 112/72 | HR 67 | Ht 67.0 in | Wt 140.0 lb

## 2018-02-27 DIAGNOSIS — I10 Essential (primary) hypertension: Secondary | ICD-10-CM | POA: Diagnosis not present

## 2018-02-27 DIAGNOSIS — I4891 Unspecified atrial fibrillation: Secondary | ICD-10-CM

## 2018-02-27 DIAGNOSIS — I481 Persistent atrial fibrillation: Secondary | ICD-10-CM | POA: Diagnosis not present

## 2018-02-27 DIAGNOSIS — I6522 Occlusion and stenosis of left carotid artery: Secondary | ICD-10-CM

## 2018-02-27 DIAGNOSIS — Z7901 Long term (current) use of anticoagulants: Secondary | ICD-10-CM

## 2018-02-27 DIAGNOSIS — I4819 Other persistent atrial fibrillation: Secondary | ICD-10-CM

## 2018-02-27 DIAGNOSIS — I5032 Chronic diastolic (congestive) heart failure: Secondary | ICD-10-CM | POA: Diagnosis not present

## 2018-02-27 NOTE — Patient Instructions (Signed)
Medication Instructions:  Continue all current medications.  Labwork: none  Testing/Procedures: none  Follow-Up: 2 months   Any Other Special Instructions Will Be Listed Below (If Applicable). 2 gm low sodium diet   If you need a refill on your cardiac medications before your next appointment, please call your pharmacy.

## 2018-02-27 NOTE — Progress Notes (Signed)
PCP: Glenda Chroman, MD Primary Cardiologist: Dr Bronson Ing Primary EP: Dr Rayann Heman  Lydia Graham is a 82 y.o. female who presents today for routine electrophysiology followup.  Since last being seen in our clinic, the patient reports doing very well.  Remains in sinus rhythm with cardioversion in August on amiodarone. Doing "much better".  Energy is improving.  Edema is also better. Today, she denies symptoms of palpitations, chest pain, shortness of breath,   dizziness, presyncope, or syncope.  + mild tremor which she thinks may be from amiodarone. The patient is otherwise without complaint today.   Past Medical History:  Diagnosis Date  . Arthritis    oa, all over - multiple areas   . Dysrhythmia    AFib   . GERD (gastroesophageal reflux disease)   . Headache    h/o migraines   . Hypertension    Past Surgical History:  Procedure Laterality Date  . ABDOMINAL HYSTERECTOMY    . APPENDECTOMY    . BACK SURGERY    . CARDIOVERSION N/A 12/19/2017   Procedure: CARDIOVERSION;  Surgeon: Arnoldo Lenis, MD;  [facility];  Service: Endoscopy;  Laterality: N/A;  . CARDIOVERSION N/A 12/26/2017   Procedure: CARDIOVERSION;  Surgeon: [name], MD;  Location: [facility] ENDOSCOPY;  Service: Cardiovascular;  Laterality: N/A;  . CARDIOVERSION N/A 01/26/2018   Procedure: CARDIOVERSION;  Surgeon: Jerline Pain, MD;  Location: [facility] ENDOSCOPY;  Service: Cardiovascular;  Laterality: N/A;  . ENDARTERECTOMY Left 03/27/2017   Procedure: ENDARTERECTOMY CAROTID LEFT;  Surgeon: [name], MD;  Location: [place];  Service: Vascular;  Laterality: Left;  . INNER EAR SURGERY Right    puntured eardrum- repaired 2x's   . NASAL SINUS SURGERY     deviated septum  . PATCH ANGIOPLASTY Left 03/27/2017   Procedure: PATCH ANGIOPLASTY LEFT CAROTID ARTERY USING Rueben Bash BIOLOGIC PATCH;  Surgeon: [name], MD;  Location: [place];  Service: Vascular;  Laterality: Left;  .  TONSILLECTOMY    . TOTAL KNEE ARTHROPLASTY Right 10/23/2016   Procedure: RIGHT TOTAL KNEE ARTHROPLASTY;  Surgeon: [name], MD;  Location: [place];  Service: Orthopedics;  Laterality: Right;    ROS- all systems are reviewed and negatives except as per HPI above  Current Outpatient Medications  Medication Sig Dispense Refill  . amiodarone (PACERONE) 200 MG tablet Take 1 tablet (200 mg total) by mouth daily. 60 tablet 3  . amLODipine (NORVASC) 5 MG tablet Take 5 mg by mouth daily with lunch.    Marland Kitchen apixaban (ELIQUIS) 5 MG TABS tablet Take 1 tablet (5 mg total) by mouth 2 (two) times daily. 56 tablet 0  . Calcium Carbonate-Vitamin D (CALCIUM 600+D PO) Take 1 tablet by mouth daily.    Marland Kitchen ELIQUIS 5 MG TABS tablet TAKE (1) TABLET TWICE DAILY. 180 tablet 1  . estradiol (ESTRACE) 0.5 MG tablet Take 0.5 mg by mouth daily.     . fluticasone (FLONASE) 50 MCG/ACT nasal spray Place 2 sprays into both nostrils daily as needed for allergies.     . furosemide (LASIX) 40 MG tablet Take 40 mg by mouth daily.    . Hypromellose (ARTIFICIAL TEARS OP) Place 2 drops into both eyes 2 (two) times daily as needed (for dry eyes).    Marland Kitchen losartan (COZAAR) 50 MG tablet Take 1 tablet (50 mg total) by mouth 2 (two) times daily. 60 tablet 3  . magnesium hydroxide (MILK OF MAGNESIA) 400 MG/5ML suspension Take 30 mLs  by mouth daily as needed for mild constipation.    . metoprolol succinate (TOPROL-XL) 100 MG 24 hr tablet Take 50 mg by mouth daily. Take with or immediately following a meal.    . pantoprazole (PROTONIX) 40 MG tablet Take 1 tablet (40 mg total) by mouth 2 (two) times daily. 60 tablet 3  . potassium chloride SA (K-DUR,KLOR-CON) 20 MEQ tablet Take 20 mEq by mouth daily.    . vitamin B-12 (CYANOCOBALAMIN) 1000 MCG tablet Take 1,000 mcg by mouth daily.     No current facility-administered medications for this visit.     Physical Exam: Vitals:   02/27/18 1137  BP: 112/72  Pulse: 67  SpO2: 98%  Weight: 140 lb  (63.5 kg)  Height: 5\' 7"  (1.702 m)    GEN- The patient is well appearing, alert and oriented x 3 today.   Head- normocephalic, atraumatic Eyes-  Sclera clear, conjunctiva pink Ears- hearing intact Oropharynx- clear Lungs- Clear to ausculation bilaterally, normal work of breathing Heart- Regular rate and rhythm, no murmurs, rubs or gallops, PMI not laterally displaced GI- soft, NT, ND, + BS Extremities- no clubbing, cyanosis, + trace edema  Wt Readings from Last 3 Encounters:  02/27/18 140 lb (63.5 kg)  02/02/18 142 lb (64.4 kg)  01/26/18 143 lb (64.9 kg)    EKG tracing ordered today is personally reviewed and shows sinus rhythm, LAD  Assessment and Plan:  1. Persistent afib The patient has symptomatic, recurrent persistent atrial fibrillation. Chads2vasc score is 4.  she is anticoagulated with eliquis. Therapeutic strategies for afib including medicine and ablation were discussed in detail with the patient today. Risk, benefits, and alternatives to EP study and radiofrequency ablation for afib were also discussed in detail today. Risks of amiodarone also discussed.  At this time, she would prefer to continue amiodarone.  If her tremor worsens or she has more afib, will consider ablation. Labs 7/19 reviewed Repeat lfts, tfts on return Consider reducing amiodarone to 100mg  daily on return  2. HTN Stable No change required today  3. Chronic diastolic dysfunction Likely due to afib Better now She wishes to stop lasix Daily weights 2 gram sodium diet advised She will contact Dr Bronson Ing in swelling increases  Return in 2 months  [name] MD, [facility] 02/27/2018 11:52 AM

## 2018-03-02 ENCOUNTER — Telehealth: Payer: Self-pay | Admitting: Cardiology

## 2018-03-02 ENCOUNTER — Telehealth: Payer: Self-pay | Admitting: Cardiovascular Disease

## 2018-03-02 NOTE — Telephone Encounter (Signed)
BP's have been fine, but stated she does have a lot of allergies.  Been doing a lot of coughing though.  Stated that she has been on her Eliquis with no missed doses.  No c/o chest pain, dizziness, or sob.

## 2018-03-02 NOTE — Telephone Encounter (Signed)
Left eye vessels have been "popping/busting"  She called her eye doctor and they can not see them until Apr 16, 2018

## 2018-03-02 NOTE — Telephone Encounter (Signed)
Patient would like to speak with nurse regarding questions about Eliquis./ tg

## 2018-03-02 NOTE — Telephone Encounter (Signed)
Called pt. No answer. Left msg to call back.  

## 2018-03-02 NOTE — Telephone Encounter (Signed)
Left message to return call 

## 2018-03-02 NOTE — Telephone Encounter (Signed)
Both seasonal allergies as well as increased pressure from extensive coughing may have led to her symptoms.

## 2018-03-03 NOTE — Telephone Encounter (Signed)
Called pt. Non answer. Left message for pt to return call.

## 2018-03-03 NOTE — Telephone Encounter (Signed)
Patient is returning a call please call cell # [phone].

## 2018-03-03 NOTE — Telephone Encounter (Signed)
Patient notified and verbalized understanding. 

## 2018-03-19 DIAGNOSIS — H1132 Conjunctival hemorrhage, left eye: Secondary | ICD-10-CM | POA: Diagnosis not present

## 2018-04-15 ENCOUNTER — Other Ambulatory Visit: Payer: Self-pay | Admitting: Physician Assistant

## 2018-04-15 DIAGNOSIS — Z0000 Encounter for general adult medical examination without abnormal findings: Secondary | ICD-10-CM | POA: Diagnosis not present

## 2018-04-15 DIAGNOSIS — I4891 Unspecified atrial fibrillation: Secondary | ICD-10-CM | POA: Diagnosis not present

## 2018-04-15 DIAGNOSIS — I10 Essential (primary) hypertension: Secondary | ICD-10-CM | POA: Diagnosis not present

## 2018-04-15 DIAGNOSIS — R5383 Other fatigue: Secondary | ICD-10-CM | POA: Diagnosis not present

## 2018-04-15 DIAGNOSIS — Z6823 Body mass index (BMI) 23.0-23.9, adult: Secondary | ICD-10-CM | POA: Diagnosis not present

## 2018-04-15 DIAGNOSIS — Z1211 Encounter for screening for malignant neoplasm of colon: Secondary | ICD-10-CM | POA: Diagnosis not present

## 2018-04-15 DIAGNOSIS — Z7189 Other specified counseling: Secondary | ICD-10-CM | POA: Diagnosis not present

## 2018-04-15 DIAGNOSIS — Z79899 Other long term (current) drug therapy: Secondary | ICD-10-CM | POA: Diagnosis not present

## 2018-04-15 DIAGNOSIS — E785 Hyperlipidemia, unspecified: Secondary | ICD-10-CM | POA: Diagnosis not present

## 2018-04-15 DIAGNOSIS — Z1331 Encounter for screening for depression: Secondary | ICD-10-CM | POA: Diagnosis not present

## 2018-04-15 DIAGNOSIS — Z299 Encounter for prophylactic measures, unspecified: Secondary | ICD-10-CM | POA: Diagnosis not present

## 2018-04-15 DIAGNOSIS — Z1339 Encounter for screening examination for other mental health and behavioral disorders: Secondary | ICD-10-CM | POA: Diagnosis not present

## 2018-04-24 ENCOUNTER — Ambulatory Visit (HOSPITAL_COMMUNITY)
Admission: RE | Admit: 2018-04-24 | Discharge: 2018-04-24 | Disposition: A | Discharge status: Home / Self Care | Payer: Medicare Other | Source: Ambulatory Visit | Attending: Family | Admitting: Family

## 2018-04-24 ENCOUNTER — Ambulatory Visit (INDEPENDENT_AMBULATORY_CARE_PROVIDER_SITE_OTHER): Payer: Medicare Other | Admitting: Internal Medicine

## 2018-04-24 ENCOUNTER — Other Ambulatory Visit: Payer: Self-pay

## 2018-04-24 ENCOUNTER — Encounter: Payer: Self-pay | Admitting: Family

## 2018-04-24 ENCOUNTER — Encounter: Payer: Self-pay | Admitting: Internal Medicine

## 2018-04-24 ENCOUNTER — Ambulatory Visit (INDEPENDENT_AMBULATORY_CARE_PROVIDER_SITE_OTHER): Payer: Medicare Other | Admitting: Family

## 2018-04-24 VITALS — BP 186/80 | HR 54 | Resp 18 | Ht 67.0 in | Wt 146.0 lb

## 2018-04-24 VITALS — BP 158/68 | HR 55 | Ht 67.0 in | Wt 146.0 lb

## 2018-04-24 DIAGNOSIS — I4891 Unspecified atrial fibrillation: Secondary | ICD-10-CM

## 2018-04-24 DIAGNOSIS — Z9889 Other specified postprocedural states: Secondary | ICD-10-CM | POA: Diagnosis not present

## 2018-04-24 DIAGNOSIS — I10 Essential (primary) hypertension: Secondary | ICD-10-CM

## 2018-04-24 DIAGNOSIS — I6522 Occlusion and stenosis of left carotid artery: Secondary | ICD-10-CM

## 2018-04-24 NOTE — Patient Instructions (Signed)

## 2018-04-24 NOTE — Patient Instructions (Signed)
Medication Instructions:  Continue all current medications.  Labwork: none  Testing/Procedures: none  Follow-Up:  6 months - [name]  3 months - Dr. Bronson Ing  Any Other Special Instructions Will Be Listed Below (If Applicable).  If you need a refill on your cardiac medications before your next appointment, please call your pharmacy.

## 2018-04-24 NOTE — Progress Notes (Signed)
PCP: Glenda Chroman, MD Primary Cardiologist: Dr Bronson Ing Primary EP: Dr Rayann Heman  Lydia Graham is a 81 y.o. female who presents today for routine electrophysiology followup.  Since last being seen in our clinic, the patient reports doing very well. Her primary concern is that her son has cancer.  She is worried and her BP has been elevated.  No symptoms of afib. Today, she denies symptoms of palpitations, chest pain, shortness of breath,  lower extremity edema, dizziness, presyncope, or syncope.  The patient is otherwise without complaint today.   Past Medical History:  Diagnosis Date  . Arthritis    oa, all over - multiple areas   . Dysrhythmia    AFib   . GERD (gastroesophageal reflux disease)   . Headache    h/o migraines   . Hypertension    Past Surgical History:  Procedure Laterality Date  . ABDOMINAL HYSTERECTOMY    . APPENDECTOMY    . BACK SURGERY    . CARDIOVERSION N/A 12/19/2017   Procedure: CARDIOVERSION;  Surgeon: Arnoldo Lenis, MD;  [facility];  Service: Endoscopy;  Laterality: N/A;  . CARDIOVERSION N/A 12/26/2017   Procedure: CARDIOVERSION;  Surgeon: [name], MD;  Location: [facility] ENDOSCOPY;  Service: Cardiovascular;  Laterality: N/A;  . CARDIOVERSION N/A 01/26/2018   Procedure: CARDIOVERSION;  Surgeon: Jerline Pain, MD;  Location: [facility] ENDOSCOPY;  Service: Cardiovascular;  Laterality: N/A;  . ENDARTERECTOMY Left 03/27/2017   Procedure: ENDARTERECTOMY CAROTID LEFT;  Surgeon: [name], MD;  Location: [place];  Service: Vascular;  Laterality: Left;  . INNER EAR SURGERY Right    puntured eardrum- repaired 2x's   . NASAL SINUS SURGERY     deviated septum  . PATCH ANGIOPLASTY Left 03/27/2017   Procedure: PATCH ANGIOPLASTY LEFT CAROTID ARTERY USING Rueben Bash BIOLOGIC PATCH;  Surgeon: [name], MD;  Location: [place];  Service: Vascular;  Laterality: Left;  . TONSILLECTOMY    . TOTAL KNEE ARTHROPLASTY Right 10/23/2016   Procedure: RIGHT TOTAL KNEE ARTHROPLASTY;  Surgeon: [name], MD;  Location: [place];  Service: Orthopedics;  Laterality: Right;    ROS- all systems are reviewed and negatives except as per HPI above  Current Outpatient Medications  Medication Sig Dispense Refill  . amiodarone (PACERONE) 200 MG tablet Take 1 tablet (200 mg total) by mouth daily. 60 tablet 3  . amLODipine (NORVASC) 5 MG tablet Take 5 mg by mouth daily with lunch.    Marland Kitchen apixaban (ELIQUIS) 5 MG TABS tablet Take 1 tablet (5 mg total) by mouth 2 (two) times daily. 56 tablet 0  . Calcium Carbonate-Vitamin D (CALCIUM 600+D PO) Take 1 tablet by mouth daily.    Marland Kitchen estradiol (ESTRACE) 0.5 MG tablet Take 0.5 mg by mouth daily.     . fluticasone (FLONASE) 50 MCG/ACT nasal spray Place 2 sprays into both nostrils daily as needed for allergies.     . furosemide (LASIX) 40 MG tablet Take 40 mg by mouth daily.    . Hypromellose (ARTIFICIAL TEARS OP) Place 2 drops into both eyes 2 (two) times daily as needed (for dry eyes).    Marland Kitchen losartan (COZAAR) 50 MG tablet Take 1 tablet (50 mg total) by mouth 2 (two) times daily. 60 tablet 3  . magnesium hydroxide (MILK OF MAGNESIA) 400 MG/5ML suspension Take 30 mLs by mouth daily as needed for mild constipation.    . metoprolol succinate (TOPROL-XL) 100 MG 24 hr tablet Take 50 mg  by mouth daily. Take with or immediately following a meal.    . pantoprazole (PROTONIX) 40 MG tablet TAKE (1) TABLET TWICE DAILY. 60 tablet 11  . potassium chloride SA (K-DUR,KLOR-CON) 20 MEQ tablet Take 20 mEq by mouth daily.    . vitamin B-12 (CYANOCOBALAMIN) 1000 MCG tablet Take 1,000 mcg by mouth daily.     No current facility-administered medications for this visit.     Physical Exam: Vitals:   04/24/18 0833  BP: (!) 158/68  Pulse: (!) 55  Weight: 146 lb (66.2 kg)  Height: 5\' 7"  (1.702 m)    GEN- The patient is well appearing, alert and oriented x 3 today.   Head- normocephalic, atraumatic Eyes-  Sclera clear,  conjunctiva pink Ears- hearing intact Oropharynx- clear Lungs- Clear to ausculation bilaterally, normal work of breathing Heart- Regular rate and rhythm, no murmurs, rubs or gallops, PMI not laterally displaced GI- soft, NT, ND, + BS Extremities- no clubbing, cyanosis, or edema  Wt Readings from Last 3 Encounters:  04/24/18 146 lb (66.2 kg)  02/27/18 140 lb (63.5 kg)  02/02/18 142 lb (64.4 kg)    EKG tracing ordered today is personally reviewed and shows sinus rhythm with LBBB  Assessment and Plan:  1. Persistent afib chads2vasc score is 4.  On eliquis Doing well with amiodarone She wishes to have Dr Woody Seller follow LFTs, TFTs The importance of this was stressed today.  I will forward my note to Dr Woody Seller Reduce amiodarone to 100mg  daily on return to see Dr Bronson Ing in 58months (she does not wish to change today) Consider ablation if further afib  2. HTN Stable No change required today  3. Chronic diastolic dysfunction Improved with sinus  Follow-up with Dr Bronson Ing in 3 months I will see in 6 months  [name] MD, [facility] 04/24/2018 8:54 AM

## 2018-04-24 NOTE — Progress Notes (Signed)
Chief Complaint: Follow up Extracranial Carotid Artery Stenosis   History of Present Illness  Lydia Graham is a 82 y.o. female who is status post left carotid endarterectomy on 03-27-17 by Dr. Donzetta Matters for asymptomatic very high-grade stenosis.   She has no neurologic symptoms.   She denies any known history of stroke or TIA. Specifically she deniesa history of amaurosis fugax or monocular blindness, unilateral facial drooping, hemiplegia, orreceptive or expressive aphasia.    She has had 4 cardioversions, and states the last one worked.  She is distressed due to her son having metastasis of Hodgkin's Disease.  She checks her blood pressure at home, and takes an extra amlodipine at her doctor advice if her blood pressure is high. Is elevated now. Pt denies chest pain, denies dyspnea. States she has had a back of her head headache for the last week, since she found out about her son's worsening health status.    Diabetic: no Tobacco use: non-smoker  Pt meds include: Statin : no, is statin intolerant, caused myalgias  ASA: no Other anticoagulants/antiplatelets: Eliquis for atrial fib, but is making her tired    Past Medical History:  Diagnosis Date  . Arthritis    oa, all over - multiple areas   . Carotid artery occlusion   . Dysrhythmia    AFib   . GERD (gastroesophageal reflux disease)   . Headache    h/o migraines   . Hypertension     Social History Social History   Tobacco Use  . Smoking status: Never Smoker  . Smokeless tobacco: Never Used  Substance Use Topics  . Alcohol use: No  . Drug use: No    Family History Family History  Problem Relation Age of Onset  . Stroke Mother     Surgical History Past Surgical History:  Procedure Laterality Date  . ABDOMINAL HYSTERECTOMY    . APPENDECTOMY    . BACK SURGERY    . CARDIOVERSION N/A 12/19/2017   Procedure: CARDIOVERSION;  Surgeon: Arnoldo Lenis, MD;  [facility];  Service:  Endoscopy;  Laterality: N/A;  . CARDIOVERSION N/A 12/26/2017   Procedure: CARDIOVERSION;  Surgeon: [name], MD;  Location: [facility] ENDOSCOPY;  Service: Cardiovascular;  Laterality: N/A;  . CARDIOVERSION N/A 01/26/2018   Procedure: CARDIOVERSION;  Surgeon: Jerline Pain, MD;  Location: [facility] ENDOSCOPY;  Service: Cardiovascular;  Laterality: N/A;  . CAROTID ENDARTERECTOMY    . ENDARTERECTOMY Left 03/27/2017   Procedure: ENDARTERECTOMY CAROTID LEFT;  Surgeon: [name], MD;  Location: [place];  Service: Vascular;  Laterality: Left;  . INNER EAR SURGERY Right    puntured eardrum- repaired 2x's   . NASAL SINUS SURGERY     deviated septum  . PATCH ANGIOPLASTY Left 03/27/2017   Procedure: PATCH ANGIOPLASTY LEFT CAROTID ARTERY USING Rueben Bash BIOLOGIC PATCH;  Surgeon: [name], MD;  Location: [place];  Service: Vascular;  Laterality: Left;  . TONSILLECTOMY    . TOTAL KNEE ARTHROPLASTY Right 10/23/2016   Procedure: RIGHT TOTAL KNEE ARTHROPLASTY;  Surgeon: [name], MD;  Location: [place];  Service: Orthopedics;  Laterality: Right;    Allergies  Allergen Reactions  . Rosuvastatin Other (See Comments)    MYALGIA, Muscle pain  . Lisinopril Cough  . Other Other (See Comments)    PAIN MEDICATIONS/ANESTHESIA  MADE BP DROP LOW   . Singulair [Montelukast Sodium] Other (See Comments)    fatigue    Current Outpatient Medications  Medication Sig Dispense  Refill  . amiodarone (PACERONE) 200 MG tablet Take 1 tablet (200 mg total) by mouth daily. 60 tablet 3  . amLODipine (NORVASC) 5 MG tablet Take 5 mg by mouth daily with lunch.    Marland Kitchen apixaban (ELIQUIS) 5 MG TABS tablet Take 1 tablet (5 mg total) by mouth 2 (two) times daily. 56 tablet 0  . Calcium Carbonate-Vitamin D (CALCIUM 600+D PO) Take 1 tablet by mouth daily.    Marland Kitchen estradiol (ESTRACE) 0.5 MG tablet Take 0.5 mg by mouth daily.     . fluticasone (FLONASE) 50 MCG/ACT nasal spray Place 2 sprays into both nostrils daily as  needed for allergies.     . furosemide (LASIX) 40 MG tablet Take 40 mg by mouth daily.    . Hypromellose (ARTIFICIAL TEARS OP) Place 2 drops into both eyes 2 (two) times daily as needed (for dry eyes).    Marland Kitchen losartan (COZAAR) 50 MG tablet Take 1 tablet (50 mg total) by mouth 2 (two) times daily. 60 tablet 3  . magnesium hydroxide (MILK OF MAGNESIA) 400 MG/5ML suspension Take 30 mLs by mouth daily as needed for mild constipation.    . metoprolol succinate (TOPROL-XL) 100 MG 24 hr tablet Take 50 mg by mouth daily. Take with or immediately following a meal.    . pantoprazole (PROTONIX) 40 MG tablet TAKE (1) TABLET TWICE DAILY. 60 tablet 11  . potassium chloride SA (K-DUR,KLOR-CON) 20 MEQ tablet Take 20 mEq by mouth daily.    . vitamin B-12 (CYANOCOBALAMIN) 1000 MCG tablet Take 1,000 mcg by mouth daily.     No current facility-administered medications for this visit.     Review of Systems : See HPI for pertinent positives and negatives.  Physical Examination  Vitals:   04/24/18 1122 04/24/18 1123  BP: (!) 185/79 (!) 186/80  Pulse: (!) 54   Resp: 18   SpO2: 100%   Weight: 146 lb (66.2 kg)   Height: 5\' 7"  (1.702 m)    Body mass index is 22.87 kg/m.  General: WDWN female in NAD GAIT: antalgic, pain in hip Eyes: PERRLA HENT: No gross abnormalities.  Pulmonary:  Respirations are non-labored, good air movement, CTAB, no rales, rhonchi, or wheezing. Cardiac: Irregular rhythm with controlled rate no detected murmur.  VASCULAR EXAM Carotid Bruits Right Left   Negative Negative     Abdominal aortic pulse is not palpable. Radial pulses are 2+ palpable and equal.                                                                                                                                          LE Pulses Right Left       POPLITEAL  not palpable   not palpable       POSTERIOR TIBIAL  not palpable   not palpable        DORSALIS PEDIS  ANTERIOR TIBIAL faintly palpable   faintly palpable     Gastrointestinal: soft, nontender, BS WNL, no r/g, no palpable masses. Musculoskeletal: No muscle atrophy/wasting. M/S 5/5 throughout, extremities without ischemic changes. Skin: No rashes, no ulcers, no cellulitis. Pt is wearing knee high compression hose.    Neurologic:  A&O X 3; appropriate affect, sensation is normal; speech is normal, CN 2-12 intact, pain and light touch intact in extremities, motor exam as listed above. Psychiatric: Normal thought content, mood appropriate to clinical situation     Assessment: Lydia Graham is a 82 y.o. female who  is status post left carotid endarterectomy on 03-27-17.  She has no history of stroke or TIA.   Fortunately she has never used tobacco and does not have DM. She is statin intolerant.  She takes Eliquis for atrial fib, stroke prophylaxis.   DATA Carotid Duplex (04-24-18): Right ICA: 1-39% stenosis Left ICA: CEA site, with 1-39% stenosis Bilateral vertebral artery flow is antegrade.  Bilateral subclavian artery waveforms are normal.  No change compared to the exam on 10-17-17.    Plan: Follow-up in 1 year with Carotid Duplex scan.  I discussed in depth with the patient the nature of atherosclerosis, and emphasized the importance of maximal medical management including strict control of blood pressure, blood glucose, and lipid levels, obtaining regular exercise, and continued cessation of smoking.  The patient is aware that without maximal medical management the underlying atherosclerotic disease process will progress, limiting the benefit of any interventions. The patient was given information about stroke prevention and what symptoms should prompt the patient to seek immediate medical care. Thank you for allowing [place] to participate in this patient's care.  Clemon Chambers, RN, MSN, FNP-C [facility] Office: [phone]  Clinic Physician: Early on call  04/24/18 11:49  AM

## 2018-05-05 DIAGNOSIS — R195 Other fecal abnormalities: Secondary | ICD-10-CM | POA: Diagnosis not present

## 2018-05-05 DIAGNOSIS — Z6823 Body mass index (BMI) 23.0-23.9, adult: Secondary | ICD-10-CM | POA: Diagnosis not present

## 2018-05-08 DIAGNOSIS — I8390 Asymptomatic varicose veins of unspecified lower extremity: Secondary | ICD-10-CM | POA: Diagnosis not present

## 2018-05-08 DIAGNOSIS — Z6824 Body mass index (BMI) 24.0-24.9, adult: Secondary | ICD-10-CM | POA: Diagnosis not present

## 2018-05-08 DIAGNOSIS — Z299 Encounter for prophylactic measures, unspecified: Secondary | ICD-10-CM | POA: Diagnosis not present

## 2018-05-08 DIAGNOSIS — I10 Essential (primary) hypertension: Secondary | ICD-10-CM | POA: Diagnosis not present

## 2018-05-12 DIAGNOSIS — K921 Melena: Secondary | ICD-10-CM | POA: Diagnosis not present

## 2018-05-12 DIAGNOSIS — Z90710 Acquired absence of both cervix and uterus: Secondary | ICD-10-CM | POA: Diagnosis not present

## 2018-05-12 DIAGNOSIS — G43909 Migraine, unspecified, not intractable, without status migrainosus: Secondary | ICD-10-CM | POA: Diagnosis not present

## 2018-05-12 DIAGNOSIS — Z7902 Long term (current) use of antithrombotics/antiplatelets: Secondary | ICD-10-CM | POA: Diagnosis not present

## 2018-05-12 DIAGNOSIS — Z96651 Presence of right artificial knee joint: Secondary | ICD-10-CM | POA: Diagnosis not present

## 2018-05-12 DIAGNOSIS — Z79899 Other long term (current) drug therapy: Secondary | ICD-10-CM | POA: Diagnosis not present

## 2018-05-12 DIAGNOSIS — Z7951 Long term (current) use of inhaled steroids: Secondary | ICD-10-CM | POA: Diagnosis not present

## 2018-05-12 DIAGNOSIS — K219 Gastro-esophageal reflux disease without esophagitis: Secondary | ICD-10-CM | POA: Diagnosis not present

## 2018-05-12 DIAGNOSIS — Z86010 Personal history of colonic polyps: Secondary | ICD-10-CM | POA: Diagnosis not present

## 2018-05-12 DIAGNOSIS — R195 Other fecal abnormalities: Secondary | ICD-10-CM | POA: Diagnosis not present

## 2018-05-12 DIAGNOSIS — Z888 Allergy status to other drugs, medicaments and biological substances status: Secondary | ICD-10-CM | POA: Diagnosis not present

## 2018-05-12 DIAGNOSIS — I10 Essential (primary) hypertension: Secondary | ICD-10-CM | POA: Diagnosis not present

## 2018-05-12 DIAGNOSIS — I482 Chronic atrial fibrillation, unspecified: Secondary | ICD-10-CM | POA: Diagnosis not present

## 2018-05-18 DIAGNOSIS — L570 Actinic keratosis: Secondary | ICD-10-CM | POA: Diagnosis not present

## 2018-05-18 DIAGNOSIS — B079 Viral wart, unspecified: Secondary | ICD-10-CM | POA: Diagnosis not present

## 2018-05-18 DIAGNOSIS — L821 Other seborrheic keratosis: Secondary | ICD-10-CM | POA: Diagnosis not present

## 2018-05-18 DIAGNOSIS — C44619 Basal cell carcinoma of skin of left upper limb, including shoulder: Secondary | ICD-10-CM | POA: Diagnosis not present

## 2018-05-18 DIAGNOSIS — Z85828 Personal history of other malignant neoplasm of skin: Secondary | ICD-10-CM | POA: Diagnosis not present

## 2018-05-18 DIAGNOSIS — D485 Neoplasm of uncertain behavior of skin: Secondary | ICD-10-CM | POA: Diagnosis not present

## 2018-05-26 DIAGNOSIS — R195 Other fecal abnormalities: Secondary | ICD-10-CM | POA: Diagnosis not present

## 2018-05-26 DIAGNOSIS — Z6823 Body mass index (BMI) 23.0-23.9, adult: Secondary | ICD-10-CM | POA: Diagnosis not present

## 2018-05-27 DIAGNOSIS — I4891 Unspecified atrial fibrillation: Secondary | ICD-10-CM | POA: Diagnosis not present

## 2018-05-27 DIAGNOSIS — I8390 Asymptomatic varicose veins of unspecified lower extremity: Secondary | ICD-10-CM | POA: Diagnosis not present

## 2018-05-27 DIAGNOSIS — Z6824 Body mass index (BMI) 24.0-24.9, adult: Secondary | ICD-10-CM | POA: Diagnosis not present

## 2018-05-27 DIAGNOSIS — I10 Essential (primary) hypertension: Secondary | ICD-10-CM | POA: Diagnosis not present

## 2018-05-27 DIAGNOSIS — Z299 Encounter for prophylactic measures, unspecified: Secondary | ICD-10-CM | POA: Diagnosis not present

## 2018-05-28 DIAGNOSIS — C44619 Basal cell carcinoma of skin of left upper limb, including shoulder: Secondary | ICD-10-CM | POA: Diagnosis not present

## 2018-05-28 DIAGNOSIS — H5203 Hypermetropia, bilateral: Secondary | ICD-10-CM | POA: Diagnosis not present

## 2018-05-28 DIAGNOSIS — H1132 Conjunctival hemorrhage, left eye: Secondary | ICD-10-CM | POA: Diagnosis not present

## 2018-05-28 DIAGNOSIS — H2513 Age-related nuclear cataract, bilateral: Secondary | ICD-10-CM | POA: Diagnosis not present

## 2018-05-28 DIAGNOSIS — H16223 Keratoconjunctivitis sicca, not specified as Sjogren's, bilateral: Secondary | ICD-10-CM | POA: Diagnosis not present

## 2018-06-02 NOTE — Progress Notes (Signed)
Cardiology Office Note   Date:  06/03/2018   ID:  [name], DOB 1936-03-05, MRN [id number]  PCP:  Glenda Chroman, MD  Cardiologist:  Bronson Ing EP: [name]  Chief Complaint  Patient presents with  . Follow-up    Bp issues.     History of Present Illness: Lydia Graham is a 82 y.o. female who presents for ongoing assessment and management of persistent atrial fib, on Eliquis and amiodarone, hypertension, and chronic diastolic CHF. Last seen by cardiology on 04/24/2018 when seen by [name]. She was continued on amiodarone and was to have her dose reduced to 100 mg daily. Consideration for ablation if atrial fib continued and she was symptomatic.   She comes today with her friend who is a FNP in [place], [place].  The patient states that her BP is very labile and hard to control. She is on multiple medications and brings a list of her blood pressures with her.   When asked about extenuating factors that maybe contributing to her BP elevations she bursts into tears and states that her son has cancer and is on a third round of chemo and radiation.     Past Medical History:  Diagnosis Date  . Arthritis    oa, all over - multiple areas   . Carotid artery occlusion   . Dysrhythmia    AFib   . GERD (gastroesophageal reflux disease)   . Headache    h/o migraines   . Hypertension     Past Surgical History:  Procedure Laterality Date  . ABDOMINAL HYSTERECTOMY    . APPENDECTOMY    . BACK SURGERY    . CARDIOVERSION N/A 12/19/2017   Procedure: CARDIOVERSION;  Surgeon: Arnoldo Lenis, MD;  [facility];  Service: Endoscopy;  Laterality: N/A;  . CARDIOVERSION N/A 12/26/2017   Procedure: CARDIOVERSION;  Surgeon: [name], MD;  Location: [facility] ENDOSCOPY;  Service: Cardiovascular;  Laterality: N/A;  . CARDIOVERSION N/A 01/26/2018   Procedure: CARDIOVERSION;  Surgeon: Jerline Pain, MD;  Location: [facility] ENDOSCOPY;  Service: Cardiovascular;  Laterality: N/A;  .  CAROTID ENDARTERECTOMY    . ENDARTERECTOMY Left 03/27/2017   Procedure: ENDARTERECTOMY CAROTID LEFT;  Surgeon: [name], MD;  Location: [place];  Service: Vascular;  Laterality: Left;  . INNER EAR SURGERY Right    puntured eardrum- repaired 2x's   . NASAL SINUS SURGERY     deviated septum  . PATCH ANGIOPLASTY Left 03/27/2017   Procedure: PATCH ANGIOPLASTY LEFT CAROTID ARTERY USING Rueben Bash BIOLOGIC PATCH;  Surgeon: [name], MD;  Location: [place];  Service: Vascular;  Laterality: Left;  . TONSILLECTOMY    . TOTAL KNEE ARTHROPLASTY Right 10/23/2016   Procedure: RIGHT TOTAL KNEE ARTHROPLASTY;  Surgeon: [name], MD;  Location: [place];  Service: Orthopedics;  Laterality: Right;     Current Outpatient Medications  Medication Sig Dispense Refill  . amiodarone (PACERONE) 200 MG tablet Take 1 tablet (200 mg total) by mouth daily. 60 tablet 3  . amLODipine (NORVASC) 5 MG tablet Take 1 tablet (5 mg total) by mouth 2 (two) times daily. TAKE AT 8AM & 8PM 60 tablet 11  . apixaban (ELIQUIS) 5 MG TABS tablet Take 1 tablet (5 mg total) by mouth 2 (two) times daily. 56 tablet 0  . Calcium Carbonate-Vitamin D (CALCIUM 600+D PO) Take 1 tablet by mouth daily.    Marland Kitchen estradiol (ESTRACE) 0.5 MG tablet Take 0.5 mg by mouth daily.     Marland Kitchen  fluticasone (FLONASE) 50 MCG/ACT nasal spray Place 2 sprays into both nostrils daily as needed for allergies.     . furosemide (LASIX) 20 MG tablet Take 1 tablet (20 mg total) by mouth daily. 30 tablet 11  . Hypromellose (ARTIFICIAL TEARS OP) Place 2 drops into both eyes 2 (two) times daily as needed (for dry eyes).    Marland Kitchen losartan (COZAAR) 50 MG tablet Take 1 tablet (50 mg total) by mouth 2 (two) times daily. TAKE AT 10AM & 10PM 60 tablet 3  . magnesium hydroxide (MILK OF MAGNESIA) 400 MG/5ML suspension Take 30 mLs by mouth daily as needed for mild constipation.    . metoprolol succinate (TOPROL-XL) 100 MG 24 hr tablet Take 50 mg by mouth daily. Take  with or immediately following a meal.    . pantoprazole (PROTONIX) 40 MG tablet TAKE (1) TABLET TWICE DAILY. 60 tablet 11  . potassium chloride SA (K-DUR,KLOR-CON) 20 MEQ tablet Take 1 tablet (20 mEq total) by mouth daily. 30 tablet 11  . vitamin B-12 (CYANOCOBALAMIN) 1000 MCG tablet Take 1,000 mcg by mouth daily.     No current facility-administered medications for this visit.     Allergies:   Rosuvastatin; Lisinopril; Other; and Singulair [montelukast sodium]    Social History:  The patient  reports that she has never smoked. She has never used smokeless tobacco. She reports that she does not drink alcohol or use drugs.   Family History:  The patient's family history includes Stroke in her mother.    ROS: All other systems are reviewed and negative. Unless otherwise mentioned in H&P    PHYSICAL EXAM: VS:  BP (!) 158/74 (BP Location: Left Arm, Patient Position: Sitting, Cuff Size: Normal)   Pulse (!) 59   Ht 5\' 7"  (1.702 m)   Wt 146 lb (66.2 kg)   LMP  (LMP Unknown)   BMI 22.87 kg/m  , BMI Body mass index is 22.87 kg/m. GEN: Well nourished, well developed, in no acute distress HEENT: normal Neck: no JVD, carotid bruits, or masses Cardiac: RRR tachycardic,  no murmurs, rubs, or gallops,no edema  Respiratory:  Clear to auscultation bilaterally, normal work of breathing GI: soft, nontender, nondistended, + BS MS: no deformity or atrophy Skin: warm and dry, no rash Neuro:  Strength and sensation are intact Psych: euthymic mood, full affect   EKG:  NSR bradycardic, rate of 59 bpm, with intraventricular block (copy made to give to her friend).   Recent Labs: 12/22/2017: B Natriuretic Peptide 1,367.0; Magnesium 1.9 12/24/2017: TSH 1.601 12/26/2017: ALT 42 01/19/2018: BUN 19; Creatinine, Ser 1.30; Hemoglobin 15.4; Platelets 252; Potassium 4.3; Sodium 132    Lipid Panel No results found for: CHOL, TRIG, HDL, CHOLHDL, VLDL, LDLCALC, LDLDIRECT    Wt Readings from Last 3  Encounters:  06/03/18 146 lb (66.2 kg)  04/24/18 146 lb (66.2 kg)  04/24/18 146 lb (66.2 kg)      Other studies Reviewed: Left ventricle: Systolic function was difficult to assess due to   rapid atrial fibrillation, but is probably in the low normal   range. LVEF approximately 50%. The cavity size was normal. Wall   thickness was normal. Diffuse hypokinesis. The study was not   technically sufficient to allow evaluation of LV diastolic   dysfunction due to atrial fibrillation. - Mitral valve: There was moderate regurgitation. - Left atrium: The atrium was moderately dilated. - Right ventricle: Systolic function was mildly reduced. - Right atrium: The atrium was mildly dilated. - Tricuspid  valve: There was moderate regurgitation. - Pulmonary arteries: PA peak pressure: 35 mm Hg (S). - Systemic veins: IVC is dilated with normal respiratory variation.   Estimated CVP 8 mmHg. - Pericardium, extracardiac: A trivial pericardial effusion was   identified.  Impressions:  - The patient was tachycardic throughout the study.  ASSESSMENT AND PLAN:  1. Hypertension; Very labile. I think this is multifactorial. She is not taking her medications at the same time consistently and is under a great deal of emotional stress.   I have reviewed her medications and times that she takes them:  Recommendation:  Take amlodipine 5 mg BID 8a and 8p Take losartan 50 mg BID   10a and 10p Add back lasix 20 mg daily in am with 10 mEq of potassium  Metoprolol 100 mg daily  She will have a renal artery ultrasound.  Will check a BMET.   2. Paroxysmal atrial fib: She will continue Eliquis and amiodarone. She is currently in NSR. She may be having paroxysms causing inaccuracy of BP recordings.   3.Situational Depression and Anxiety: She is encouraged to seek emotional support from friends or possibly professional counseling if becomes to hard for her on her own.   urrent medicines are reviewed at length  with the patient today.    Labs/ tests ordered today include: BMET    Phill Myron. West Pugh, ANP, AACC   06/03/2018 1:27 PM    [place] [address] Office [phone] Fax [phone]

## 2018-06-03 ENCOUNTER — Ambulatory Visit (INDEPENDENT_AMBULATORY_CARE_PROVIDER_SITE_OTHER): Payer: Medicare Other | Admitting: Adult Health

## 2018-06-03 ENCOUNTER — Encounter: Payer: Self-pay | Admitting: Adult Health

## 2018-06-03 VITALS — BP 158/74 | HR 59 | Ht 67.0 in | Wt 146.0 lb

## 2018-06-03 DIAGNOSIS — I6522 Occlusion and stenosis of left carotid artery: Secondary | ICD-10-CM

## 2018-06-03 DIAGNOSIS — I10 Essential (primary) hypertension: Secondary | ICD-10-CM

## 2018-06-03 DIAGNOSIS — Z79899 Other long term (current) drug therapy: Secondary | ICD-10-CM

## 2018-06-03 DIAGNOSIS — I480 Paroxysmal atrial fibrillation: Secondary | ICD-10-CM | POA: Diagnosis not present

## 2018-06-03 MED ORDER — POTASSIUM CHLORIDE CRYS ER 20 MEQ PO TBCR: 20.0000 meq | EXTENDED_RELEASE_TABLET | Freq: Every day | ORAL | 11 refills | Status: DC

## 2018-06-03 MED ORDER — LOSARTAN POTASSIUM 50 MG PO TABS: 50.0000 mg | ORAL_TABLET | Freq: Two times a day (BID) | ORAL | 3 refills | Status: DC

## 2018-06-03 MED ORDER — AMLODIPINE BESYLATE 5 MG PO TABS: 5.0000 mg | ORAL_TABLET | Freq: Two times a day (BID) | ORAL | 11 refills | Status: DC

## 2018-06-03 MED ORDER — FUROSEMIDE 20 MG PO TABS: 20.0000 mg | ORAL_TABLET | Freq: Every day | ORAL | 11 refills | Status: DC

## 2018-06-03 NOTE — Patient Instructions (Signed)
Medication Instructions:  TAKE LASIX 20MG  DAILY  TAKE POTASSIUM 20 mEq DAILY  If you need a refill on your cardiac medications before your next appointment, please call your pharmacy.  Labwork: BMET TODAY AND IN 1 MONTH (~JAN 12) HERE IN OUR OFFICE AT [facility] Take the provided lab slips with you to the lab for your blood draw. -->You will NOT need to fast  If you have labs (blood work) drawn today and your tests are completely normal, you will receive your results only by: Marland Kitchen MyChart Message (if you have MyChart) OR . A paper copy in the mail If you have any lab test that is abnormal or we need to change your treatment, we will call you to review the results.  Testing/Procedures: RENAL ULTRASOUND - Your physician has requested that you have a A renal ultrasound is a safe and painless test that uses sound waves to make images of the kidneys, ureters, and bladder. The kidneys are a pair of bean-shaped organs located toward the back of the abdominal cavity, just above the waist. They remove waste products from the blood and produce urine.  Follow-Up: You will need a follow up appointment in [place].   At [facility], you and your health needs are our priority.  As part of our continuing mission to provide you with exceptional heart care, we have created designated Provider Care Teams.  These Care Teams include your primary Cardiologist (physician) and Advanced Practice Providers (APPs -  Physician Assistants and Nurse Practitioners) who all work together to provide you with the care you need, when you need it.

## 2018-06-04 LAB — BASIC METABOLIC PANEL
BUN / CREAT RATIO: 19 (ref 12–28)
BUN: 21 mg/dL (ref 8–27)
CHLORIDE: 101 mmol/L (ref 96–106)
CO2: 23 mmol/L (ref 20–29)
Calcium: 9.7 mg/dL (ref 8.7–10.3)
Creatinine, Ser: 1.13 mg/dL — ABNORMAL HIGH (ref 0.57–1.00)
GFR calc non Af Amer: 46 mL/min/{1.73_m2} — ABNORMAL LOW (ref >59)
GFR, EST AFRICAN AMERICAN: 53 mL/min/{1.73_m2} — AB (ref >59)
Glucose: 83 mg/dL (ref 65–99)
POTASSIUM: 5.2 mmol/L (ref 3.5–5.2)
Sodium: 139 mmol/L (ref 134–144)

## 2018-06-05 NOTE — Progress Notes (Signed)
Notes recorded by Lendon Colonel, NP on 06/04/2018 at 7:35 AM EST I have reviewed your Labs, your Creatinine is improved and GFR was also found to be improved. Continue current regimen. You  are having a renal ultrasound for evaluation of renal artery stenosis, we will contact you with these results.

## 2018-06-09 DIAGNOSIS — B079 Viral wart, unspecified: Secondary | ICD-10-CM | POA: Diagnosis not present

## 2018-06-13 ENCOUNTER — Other Ambulatory Visit: Payer: Self-pay | Admitting: Cardiovascular Disease

## 2018-06-13 ENCOUNTER — Other Ambulatory Visit: Payer: Self-pay | Admitting: Physician Assistant

## 2018-06-15 ENCOUNTER — Ambulatory Visit (INDEPENDENT_AMBULATORY_CARE_PROVIDER_SITE_OTHER): Payer: Medicare Other

## 2018-06-15 DIAGNOSIS — I10 Essential (primary) hypertension: Secondary | ICD-10-CM

## 2018-06-15 NOTE — Telephone Encounter (Signed)
This is a A-Fib clinic pt 

## 2018-07-06 DIAGNOSIS — B079 Viral wart, unspecified: Secondary | ICD-10-CM | POA: Diagnosis not present

## 2018-07-29 ENCOUNTER — Ambulatory Visit (INDEPENDENT_AMBULATORY_CARE_PROVIDER_SITE_OTHER): Payer: Medicare Other | Admitting: Cardiovascular Disease

## 2018-07-29 ENCOUNTER — Encounter: Payer: Self-pay | Admitting: Cardiovascular Disease

## 2018-07-29 VITALS — BP 153/75 | HR 53 | Ht 67.0 in | Wt 148.2 lb

## 2018-07-29 DIAGNOSIS — Z79899 Other long term (current) drug therapy: Secondary | ICD-10-CM

## 2018-07-29 DIAGNOSIS — I10 Essential (primary) hypertension: Secondary | ICD-10-CM

## 2018-07-29 DIAGNOSIS — Z9889 Other specified postprocedural states: Secondary | ICD-10-CM | POA: Diagnosis not present

## 2018-07-29 DIAGNOSIS — I4891 Unspecified atrial fibrillation: Secondary | ICD-10-CM | POA: Diagnosis not present

## 2018-07-29 DIAGNOSIS — I4819 Other persistent atrial fibrillation: Secondary | ICD-10-CM

## 2018-07-29 DIAGNOSIS — R600 Localized edema: Secondary | ICD-10-CM | POA: Diagnosis not present

## 2018-07-29 MED ORDER — POTASSIUM CHLORIDE CRYS ER 20 MEQ PO TBCR: 20.0000 meq | EXTENDED_RELEASE_TABLET | ORAL | Status: DC | PRN

## 2018-07-29 MED ORDER — AMIODARONE HCL 200 MG PO TABS: 100.0000 mg | ORAL_TABLET | Freq: Every day | ORAL | Status: DC

## 2018-07-29 MED ORDER — FUROSEMIDE 20 MG PO TABS: 20.0000 mg | ORAL_TABLET | ORAL | Status: DC | PRN

## 2018-07-29 NOTE — Patient Instructions (Signed)
Medication Instructions:   Decrease Amiodarone to 100mg  daily (1/2 tab of the 200mg  tablet).  Change your Furosemide and Potassium to as needed.   Continue all other medications.    Labwork:  TSH, LFT - orders given today.    Office will contact with results via phone or letter.    Testing/Procedures: none  Follow-Up: Your physician wants you to follow up in: 6 months.  You will receive a reminder letter in the mail one-two months in advance.  If you don't receive a letter, please call our office to schedule the follow up appointment   Any Other Special Instructions Will Be Listed Below (If Applicable).  If you need a refill on your cardiac medications before your next appointment, please call your pharmacy.

## 2018-07-29 NOTE — Progress Notes (Signed)
SUBJECTIVE: The patient presents for routine follow-up.  She has persistent atrial fibrillation and saw [name] on 04/24/2018.  He recommended reducing amiodarone 200 mg daily.  She then saw Jory Sims, DNP on 06/03/2018.  She complained of labile blood pressures.  She was given recommendations about how to take her antihypertensive medications.  She has significant anxiety and stress.  She underwent renal artery Dopplers on 06/15/2018 which showed no evidence of stenosis.  She is here with her daughter.  She is feeling well today and denies chest pain, palpitations, and shortness of breath.  She has occasional lightheadedness and her blood pressure has been 115/58 with a heart rate of 51 on one occasion.  She asks about reducing amiodarone dose.  She brought in a blood pressure log which I reviewed and demonstrates overall good control of blood pressures.  TSH normal at 2.8 on 04/19/2018.     Review of Systems: As per "subjective", otherwise negative.  Allergies  Allergen Reactions  . Rosuvastatin Other (See Comments)    MYALGIA, Muscle pain  . Lisinopril Cough  . Other Other (See Comments)    PAIN MEDICATIONS/ANESTHESIA  MADE BP DROP LOW   . Singulair [Montelukast Sodium] Other (See Comments)    fatigue    Current Outpatient Medications  Medication Sig Dispense Refill  . amiodarone (PACERONE) 200 MG tablet Take 1 tablet (200 mg total) by mouth daily. 60 tablet 3  . amLODipine (NORVASC) 5 MG tablet Take 1 tablet (5 mg total) by mouth 2 (two) times daily. TAKE AT 8AM & 8PM 60 tablet 11  . apixaban (ELIQUIS) 5 MG TABS tablet Take 1 tablet (5 mg total) by mouth 2 (two) times daily. 56 tablet 0  . Calcium Carbonate-Vitamin D (CALCIUM 600+D PO) Take 1 tablet by mouth daily.    Marland Kitchen estradiol (ESTRACE) 0.5 MG tablet Take 0.5 mg by mouth daily.     . fluticasone (FLONASE) 50 MCG/ACT nasal spray Place 2 sprays into both nostrils daily as needed for allergies.     .  furosemide (LASIX) 20 MG tablet Take 1 tablet (20 mg total) by mouth daily. 30 tablet 11  . Hypromellose (ARTIFICIAL TEARS OP) Place 2 drops into both eyes 2 (two) times daily as needed (for dry eyes).    Marland Kitchen losartan (COZAAR) 50 MG tablet Take 1 tablet (50 mg total) by mouth 2 (two) times daily. TAKE AT 10AM & 10PM 60 tablet 3  . magnesium hydroxide (MILK OF MAGNESIA) 400 MG/5ML suspension Take 30 mLs by mouth daily as needed for mild constipation.    . metoprolol succinate (TOPROL-XL) 100 MG 24 hr tablet Take 50 mg by mouth daily. Take with or immediately following a meal.    . metoprolol succinate (TOPROL-XL) 50 MG 24 hr tablet TAKE 1 TABLET BY MOUTH DAILY AT LUNCH WITH OR IMMEDIATELY FOLLOWING AMEAL. 30 tablet 6  . pantoprazole (PROTONIX) 40 MG tablet TAKE (1) TABLET TWICE DAILY. 60 tablet 11  . potassium chloride SA (K-DUR,KLOR-CON) 20 MEQ tablet Take 1 tablet (20 mEq total) by mouth daily. 30 tablet 11  . vitamin B-12 (CYANOCOBALAMIN) 1000 MCG tablet Take 1,000 mcg by mouth daily.     No current facility-administered medications for this visit.     Past Medical History:  Diagnosis Date  . Arthritis    oa, all over - multiple areas   . Carotid artery occlusion   . Dysrhythmia    AFib   . GERD (gastroesophageal reflux disease)   .  Headache    h/o migraines   . Hypertension     Past Surgical History:  Procedure Laterality Date  . ABDOMINAL HYSTERECTOMY    . APPENDECTOMY    . BACK SURGERY    . CARDIOVERSION N/A 12/19/2017   Procedure: CARDIOVERSION;  Surgeon: Arnoldo Lenis, MD;  [facility];  Service: Endoscopy;  Laterality: N/A;  . CARDIOVERSION N/A 12/26/2017   Procedure: CARDIOVERSION;  Surgeon: [name], MD;  Location: [facility] ENDOSCOPY;  Service: Cardiovascular;  Laterality: N/A;  . CARDIOVERSION N/A 01/26/2018   Procedure: CARDIOVERSION;  Surgeon: Jerline Pain, MD;  Location: [facility] ENDOSCOPY;  Service: Cardiovascular;  Laterality: N/A;  . CAROTID  ENDARTERECTOMY    . ENDARTERECTOMY Left 03/27/2017   Procedure: ENDARTERECTOMY CAROTID LEFT;  Surgeon: [name], MD;  Location: [place];  Service: Vascular;  Laterality: Left;  . INNER EAR SURGERY Right    puntured eardrum- repaired 2x's   . NASAL SINUS SURGERY     deviated septum  . PATCH ANGIOPLASTY Left 03/27/2017   Procedure: PATCH ANGIOPLASTY LEFT CAROTID ARTERY USING Rueben Bash BIOLOGIC PATCH;  Surgeon: [name], MD;  Location: [place];  Service: Vascular;  Laterality: Left;  . TONSILLECTOMY    . TOTAL KNEE ARTHROPLASTY Right 10/23/2016   Procedure: RIGHT TOTAL KNEE ARTHROPLASTY;  Surgeon: [name], MD;  Location: [place];  Service: Orthopedics;  Laterality: Right;    Social History   Socioeconomic History  . Marital status: Single    Spouse name: Not on file  . Number of children: Not on file  . Years of education: Not on file  . Highest education level: Not on file  Occupational History  . Not on file  Social Needs  . Financial resource strain: Not on file  . Food insecurity:    Worry: Not on file    Inability: Not on file  . Transportation needs:    Medical: Not on file    Non-medical: Not on file  Tobacco Use  . Smoking status: Never Smoker  . Smokeless tobacco: Never Used  Substance and Sexual Activity  . Alcohol use: No  . Drug use: No  . Sexual activity: Not Currently    Birth control/protection: Surgical  Lifestyle  . Physical activity:    Days per week: Not on file    Minutes per session: Not on file  . Stress: Not on file  Relationships  . Social connections:    Talks on phone: Not on file    Gets together: Not on file    Attends religious service: Not on file    Active member of club or organization: Not on file    Attends meetings of clubs or organizations: Not on file    Relationship status: Not on file  . Intimate partner violence:    Fear of current or ex partner: Not on file    Emotionally abused: Not on file     Physically abused: Not on file    Forced sexual activity: Not on file  Other Topics Concern  . Not on file  Social History Narrative  . Not on file     Vitals:   07/29/18 1040  BP: (!) 153/75  Pulse: (!) 53  SpO2: 99%  Weight: 148 lb 3.2 oz (67.2 kg)  Height: 5\' 7"  (1.702 m)    Wt Readings from Last 3 Encounters:  07/29/18 148 lb 3.2 oz (67.2 kg)  06/03/18 146 lb (66.2 kg)  04/24/18 146  lb (66.2 kg)     PHYSICAL EXAM General: NAD HEENT: Normal. Neck: No JVD, no thyromegaly. Lungs: Clear to auscultation bilaterally with normal respiratory effort. CV: Bradycardic, regular rhythm, normal S1/S2, no S3/S4, no murmur. No pretibial or periankle edema.    Abdomen: Soft, nontender, no distention.  Neurologic: Alert and oriented.  Psych: Normal affect. Skin: Normal. Musculoskeletal: No gross deformities.    ECG: Reviewed above under Subjective   Labs: Lab Results  Component Value Date/Time   K 5.2 06/03/2018 11:54 AM   BUN 21 06/03/2018 11:54 AM   CREATININE 1.13 (H) 06/03/2018 11:54 AM   ALT 42 12/26/2017 02:22 PM   TSH 1.601 12/24/2017 02:31 PM   HGB 15.4 (H) 01/19/2018 12:50 PM     Lipids: No results found for: LDLCALC, LDLDIRECT, CHOL, TRIG, HDL     ASSESSMENT AND PLAN: 1.  Persistent atrial fibrillation: I will reduce amiodarone to 100 mg daily as recommended by [name].  Anticoagulated with Eliquis.  Consider ablation if she has recurrence.  Follow-up with [name].  I will check TSH and LFTs.  2.  Accelerated hypertension: Blood pressure is mildly elevated today.  She is on multiple antihypertensive agents.  I reviewed her blood pressure log which demonstrates overall good control.  No changes to therapy today.  3. High-grade left internal carotid artery stenosis status post carotid endarterectomy in October 2018: Stable. She is on Eliquis for atrial fibrillation.  Carotid Dopplers on 04/24/2018 showed bilateral 1 to 39% internal carotid artery  stenosis.  4.  Bilateral leg edema/shortness of breath: Euvolemic on Lasix 20 mg daily with supplemental potassium.  She is wearing compression stockings.  I will switch Lasix and potassium to be used as needed.    Disposition: Follow up 6 months   Kate Sable, M.D., F.A.C.C.

## 2018-07-31 ENCOUNTER — Telehealth: Payer: Self-pay | Admitting: *Deleted

## 2018-07-31 NOTE — Telephone Encounter (Signed)
Notes recorded by Laurine Blazer, LPN on 10/22/256 at 5:27 PM EST Patient notified. Copy to pmd. ------  Notes recorded by Laurine Blazer, LPN on 12/29/2421 at 5:36 PM EST Left message to return call.  ------  Notes recorded by Herminio Commons, MD on 07/29/2018 at 3:58 PM EST Thyroid function is normal. One isolated liver enzyme is marginally elevated but insignificant. I would continue amiodarone therapy and repeat labs in 6 months.

## 2018-09-24 ENCOUNTER — Other Ambulatory Visit: Payer: Self-pay | Admitting: Cardiovascular Disease

## 2018-09-30 ENCOUNTER — Other Ambulatory Visit: Payer: Self-pay | Admitting: Adult Health

## 2018-09-30 NOTE — Telephone Encounter (Signed)
Losartan refilled 

## 2018-10-06 DIAGNOSIS — B079 Viral wart, unspecified: Secondary | ICD-10-CM | POA: Diagnosis not present

## 2018-10-21 ENCOUNTER — Telehealth: Payer: Self-pay

## 2018-10-21 NOTE — Telephone Encounter (Signed)
Follow up   Patient's sister in law is calling to get instructions about virtual visit. Please return call.

## 2018-10-21 NOTE — Telephone Encounter (Signed)
Spoke with pt regarding appt on 10/23/18. Pt was advise to check vitals prior to appt. Pt questions and concerns were address. 

## 2018-10-22 ENCOUNTER — Telehealth: Payer: Self-pay | Admitting: Internal Medicine

## 2018-10-22 NOTE — Telephone Encounter (Signed)
Please give pt's daughter Rudine a call [phone], she has some questions concerning her Mother's visit w/ Dr. Lamount Cohen tomorrow.

## 2018-10-23 ENCOUNTER — Telehealth (INDEPENDENT_AMBULATORY_CARE_PROVIDER_SITE_OTHER): Payer: Medicare Other | Admitting: Internal Medicine

## 2018-10-23 VITALS — BP 133/61 | HR 53 | Wt 144.0 lb

## 2018-10-23 DIAGNOSIS — I4819 Other persistent atrial fibrillation: Secondary | ICD-10-CM

## 2018-10-23 DIAGNOSIS — I10 Essential (primary) hypertension: Secondary | ICD-10-CM

## 2018-10-23 MED ORDER — AMIODARONE HCL 100 MG PO TABS: 100.0000 mg | ORAL_TABLET | ORAL | 3 refills | Status: DC

## 2018-10-23 NOTE — Progress Notes (Signed)
Electrophysiology TeleHealth Note   Due to national recommendations of social distancing due to COVID 19, an audio/video telehealth visit is felt to be most appropriate for this patient at this time.  See MyChart message from today for the patient's consent to telehealth for Lydia Graham.   Date:  10/23/2018   ID:  Lydia Graham, DOB Sep 08, 1935, MRN [id number]  Location: patient's home  Provider location: [facility]  Evaluation Performed: Follow-up visit  PCP:  Glenda Chroman, MD  Cardiologist:  Kate Sable, MD  Electrophysiologist:  Dr Rayann Heman  Chief Complaint:  afib  History of Present Illness:    Lydia Graham is a 83 y.o. female who presents via audio/video conferencing for a telehealth visit today.  Since last being seen in our clinic, the patient reports doing very well.  Today, she denies symptoms of palpitations, chest pain, shortness of breath,  lower extremity edema, dizziness, presyncope, or syncope.  The patient is otherwise without complaint today.  The patient denies symptoms of fevers, chills, cough, or new SOB worrisome for COVID 19.  Past Medical History:  Diagnosis Date  . Arthritis    oa, all over - multiple areas   . Carotid artery occlusion   . Dysrhythmia    AFib   . GERD (gastroesophageal reflux disease)   . Headache    h/o migraines   . Hypertension     Past Surgical History:  Procedure Laterality Date  . ABDOMINAL HYSTERECTOMY    . APPENDECTOMY    . BACK SURGERY    . CARDIOVERSION N/A 12/19/2017   Procedure: CARDIOVERSION;  Surgeon: Arnoldo Lenis, MD;  [facility];  Service: Endoscopy;  Laterality: N/A;  . CARDIOVERSION N/A 12/26/2017   Procedure: CARDIOVERSION;  Surgeon: [name], MD;  Location: [facility] ENDOSCOPY;  Service: Cardiovascular;  Laterality: N/A;  . CARDIOVERSION N/A 01/26/2018   Procedure: CARDIOVERSION;  Surgeon: Jerline Pain, MD;  Location: [facility] ENDOSCOPY;  Service: Cardiovascular;  Laterality: N/A;   . CAROTID ENDARTERECTOMY    . ENDARTERECTOMY Left 03/27/2017   Procedure: ENDARTERECTOMY CAROTID LEFT;  Surgeon: [name], MD;  Location: [place];  Service: Vascular;  Laterality: Left;  . INNER EAR SURGERY Right    puntured eardrum- repaired 2x's   . NASAL SINUS SURGERY     deviated septum  . PATCH ANGIOPLASTY Left 03/27/2017   Procedure: PATCH ANGIOPLASTY LEFT CAROTID ARTERY USING Rueben Bash BIOLOGIC PATCH;  Surgeon: [name], MD;  Location: [place];  Service: Vascular;  Laterality: Left;  . TONSILLECTOMY    . TOTAL KNEE ARTHROPLASTY Right 10/23/2016   Procedure: RIGHT TOTAL KNEE ARTHROPLASTY;  Surgeon: [name], MD;  Location: [place];  Service: Orthopedics;  Laterality: Right;    Current Outpatient Medications  Medication Sig Dispense Refill  . amLODipine (NORVASC) 5 MG tablet Take 1 tablet (5 mg total) by mouth 2 (two) times daily. TAKE AT 8AM & 8PM 60 tablet 11  . Calcium Carbonate-Vitamin D (CALCIUM 600+D PO) Take 1 tablet by mouth daily.    Marland Kitchen ELIQUIS 5 MG TABS tablet TAKE (1) TABLET TWICE DAILY. 60 tablet 6  . estradiol (ESTRACE) 0.5 MG tablet Take 0.5 mg by mouth daily.     . fluticasone (FLONASE) 50 MCG/ACT nasal spray Place 2 sprays into both nostrils daily as needed for allergies.     . furosemide (LASIX) 20 MG tablet Take 1 tablet (20 mg total) by mouth as needed for edema (leg swelling /  shortness of breath).    . Hypromellose (ARTIFICIAL TEARS OP) Place 2 drops into both eyes 2 (two) times daily as needed (for dry eyes).    Marland Kitchen losartan (COZAAR) 50 MG tablet TAKE 1 TABLET TWICE DAILY AT 10 AM AND 10 PM. 60 tablet 1  . magnesium hydroxide (MILK OF MAGNESIA) 400 MG/5ML suspension Take 30 mLs by mouth daily as needed for mild constipation.    . metoprolol succinate (TOPROL-XL) 50 MG 24 hr tablet TAKE 1 TABLET BY MOUTH DAILY AT LUNCH WITH OR IMMEDIATELY FOLLOWING AMEAL. 30 tablet 6  . pantoprazole (PROTONIX) 40 MG tablet TAKE (1) TABLET TWICE DAILY. 60  tablet 11  . potassium chloride SA (K-DUR,KLOR-CON) 20 MEQ tablet Take 1 tablet (20 mEq total) by mouth as needed (take on days you take your Furosemide).    . vitamin B-12 (CYANOCOBALAMIN) 1000 MCG tablet Take 1,000 mcg by mouth daily.    Marland Kitchen amiodarone (PACERONE) 100 MG tablet Take 1 tablet (100 mg total) by mouth every other day. 45 tablet 3   No current facility-administered medications for this visit.     Allergies:   Rosuvastatin; Lisinopril; Other; and Singulair [montelukast sodium]   Social History:  The patient  reports that she has never smoked. She has never used smokeless tobacco. She reports that she does not drink alcohol or use drugs.   Family History:  The patient's  family history includes Stroke in her mother.   ROS:  Please see the history of present illness.   All other systems are personally reviewed and negative.    Exam:    Vital Signs:  BP 133/61   Pulse (!) 53   Wt 144 lb (65.3 kg)   LMP  (LMP Unknown)   SpO2 98%   BMI 22.55 kg/m   Well appearing, alert and conversant, regular work of breathing,  good skin color Eyes- anicteric, neuro- grossly intact, skin- no apparent rash or lesions or cyanosis, mouth- oral mucosa is pink   Labs/Other Tests and Data Reviewed:    Recent Labs: 12/22/2017: B Natriuretic Peptide 1,367.0; Magnesium 1.9 12/24/2017: TSH 1.601 12/26/2017: ALT 42 01/19/2018: Hemoglobin 15.4; Platelets 252 06/03/2018: BUN 21; Creatinine, Ser 1.13; Potassium 5.2; Sodium 139   Wt Readings from Last 3 Encounters:  10/23/18 144 lb (65.3 kg)  07/29/18 148 lb 3.2 oz (67.2 kg)  06/03/18 146 lb (66.2 kg)     Other studies personally reviewed: Additional studies/ records that were reviewed today include:  My prior notes Review of the above records today demonstrates: as above    ASSESSMENT & PLAN:    1.  Persistent afib chads2vasc score is 4.  She is on eliquis AF is well controlled with amiodarone.  Tremor is improved with low dose amiodarone.   She worries about long term effects of amiodarone and would like to reduce this further. I will therefore reduce amiodarone to 100mg  QOD If she has further AF, we will consider ablation vs increasing amiodarone  2. HTN Stable No change required today  3. Chronic diastolic dysfunction Stable No change required today  4. COVID 19 screen The patient denies symptoms of COVID 19 at this time.  The importance of social distancing was discussed today.  Follow-up:  6 months with me  Current medicines are reviewed at length with the patient today.   The patient does not have concerns regarding her medicines.  The following changes were made today:  none  Labs/ tests ordered today include:  No orders  of the defined types were placed in this encounter.  Patient Risk:  after full review of this patients clinical status, I feel that they are at moderate risk at this time.  Today, I have spent 15 minutes with the patient with telehealth technology discussing afib .    Army Fossa, MD  10/23/2018 10:22 AM     [facility] [address] [place] [address] [phone] (office) [phone] (fax)

## 2018-10-29 ENCOUNTER — Other Ambulatory Visit: Payer: Self-pay | Admitting: Adult Health

## 2018-11-23 DIAGNOSIS — B079 Viral wart, unspecified: Secondary | ICD-10-CM | POA: Diagnosis not present

## 2018-11-23 DIAGNOSIS — Z85828 Personal history of other malignant neoplasm of skin: Secondary | ICD-10-CM | POA: Diagnosis not present

## 2018-11-23 DIAGNOSIS — L570 Actinic keratosis: Secondary | ICD-10-CM | POA: Diagnosis not present

## 2018-11-23 DIAGNOSIS — L821 Other seborrheic keratosis: Secondary | ICD-10-CM | POA: Diagnosis not present

## 2018-12-15 ENCOUNTER — Ambulatory Visit (INDEPENDENT_AMBULATORY_CARE_PROVIDER_SITE_OTHER): Payer: Medicare Other | Admitting: Orthopedic Surgery

## 2018-12-15 ENCOUNTER — Ambulatory Visit (INDEPENDENT_AMBULATORY_CARE_PROVIDER_SITE_OTHER): Payer: Medicare Other

## 2018-12-15 ENCOUNTER — Other Ambulatory Visit: Payer: Self-pay

## 2018-12-15 ENCOUNTER — Encounter: Payer: Self-pay | Admitting: Orthopedic Surgery

## 2018-12-15 VITALS — Ht 67.0 in | Wt 144.0 lb

## 2018-12-15 DIAGNOSIS — M79672 Pain in left foot: Secondary | ICD-10-CM | POA: Diagnosis not present

## 2018-12-16 ENCOUNTER — Encounter: Payer: Self-pay | Admitting: Orthopedic Surgery

## 2018-12-16 NOTE — Progress Notes (Signed)
Office Visit Note   Patient: Lydia Graham           Date of Birth: 06/01/1936           MRN: [id number] Visit Date: 12/15/2018              Requested by: Glenda Chroman, MD [address] [place],  [place] [address] PCP: Glenda Chroman, MD  Chief Complaint  Patient presents with  . Left Foot - Pain      HPI: Patient is a 83 year old woman who complains of increased pain across her midfoot.  She denies any injuries she states the pain is severe she states she uses pain patches as well as Tylenol to help her sleep.  She states she has been symptomatic for 2 to 3 weeks she uses a cane for ambulation.  Assessment & Plan: Visit Diagnoses:  1. Pain in left foot     Plan: Recommended a stiff soled sneakers such as [place] as well as over-the-counter orthotics.    Follow-Up Instructions: Return if symptoms worsen or fail to improve.   Ortho Exam  Patient is alert, oriented, no adenopathy, well-dressed, normal affect, normal respiratory effort. Examination patient has good dorsalis pedis pulse she has good dorsiflexion there is no redness cellulitis or swelling in the foot she is tender to palpation across the Lisfranc complex and distraction through the foot reproduces pain across the Lisfranc complex.  Patient uses a cane for ambulation.  Imaging: No results found. No images are attached to the encounter.  Labs: No results found for: HGBA1C, ESRSEDRATE, CRP, LABURIC, REPTSTATUS, GRAMSTAIN, CULT, LABORGA   Lab Results  Component Value Date   ALBUMIN 3.4 (L) 12/26/2017   ALBUMIN 4.0 03/25/2017    Body mass index is 22.55 kg/m.  Orders:  Orders Placed This Encounter  Procedures  . XR Foot 2 Views Left   No orders of the defined types were placed in this encounter.    Procedures: No procedures performed  Clinical Data: No additional findings.  ROS:  All other systems negative, except as noted in the HPI. Review of Systems  Objective: Vital Signs: Ht 5\' 7"  (1.702  m)   Wt 144 lb (65.3 kg)   LMP  (LMP Unknown)   BMI 22.55 kg/m   Specialty Comments:  No specialty comments available.  PMFS History: Patient Active Problem List   Diagnosis Date Noted  . PAF (paroxysmal atrial fibrillation) ([place])   . Chronic cough 12/29/2017  . Atrial fibrillation with rapid ventricular response ([place]) 12/23/2017  . Acute on chronic combined systolic and diastolic CHF (congestive heart failure) ([place])   . Menopausal symptom 10/17/2017  . Menopausal syndrome 10/17/2017  . Carotid stenosis 03/27/2017  . S/P total knee arthroplasty, right 10/23/2016  . Unilateral primary osteoarthritis, right knee 07/13/2016   Past Medical History:  Diagnosis Date  . Arthritis    oa, all over - multiple areas   . Carotid artery occlusion   . Dysrhythmia    AFib   . GERD (gastroesophageal reflux disease)   . Headache    h/o migraines   . Hypertension     Family History  Problem Relation Age of Onset  . Stroke Mother     Past Surgical History:  Procedure Laterality Date  . ABDOMINAL HYSTERECTOMY    . APPENDECTOMY    . BACK SURGERY    . CARDIOVERSION N/A 12/19/2017   Procedure: CARDIOVERSION;  Surgeon: Arnoldo Lenis, MD;  [facility];  Service: Endoscopy;  Laterality: N/A;  . CARDIOVERSION N/A 12/26/2017   Procedure: CARDIOVERSION;  Surgeon: [name], MD;  Location: [facility] ENDOSCOPY;  Service: Cardiovascular;  Laterality: N/A;  . CARDIOVERSION N/A 01/26/2018   Procedure: CARDIOVERSION;  Surgeon: Jerline Pain, MD;  Location: [facility] ENDOSCOPY;  Service: Cardiovascular;  Laterality: N/A;  . CAROTID ENDARTERECTOMY    . ENDARTERECTOMY Left 03/27/2017   Procedure: ENDARTERECTOMY CAROTID LEFT;  Surgeon: [name], MD;  Location: [place];  Service: Vascular;  Laterality: Left;  . INNER EAR SURGERY Right    puntured eardrum- repaired 2x's   . NASAL SINUS SURGERY     deviated septum  . PATCH ANGIOPLASTY Left 03/27/2017   Procedure: PATCH ANGIOPLASTY  LEFT CAROTID ARTERY USING Rueben Bash BIOLOGIC PATCH;  Surgeon: [name], MD;  Location: [place];  Service: Vascular;  Laterality: Left;  . TONSILLECTOMY    . TOTAL KNEE ARTHROPLASTY Right 10/23/2016   Procedure: RIGHT TOTAL KNEE ARTHROPLASTY;  Surgeon: [name], MD;  Location: [place];  Service: Orthopedics;  Laterality: Right;   Social History   Occupational History  . Not on file  Tobacco Use  . Smoking status: Never Smoker  . Smokeless tobacco: Never Used  Substance and Sexual Activity  . Alcohol use: No  . Drug use: No  . Sexual activity: Not Currently    Birth control/protection: Surgical

## 2019-02-09 ENCOUNTER — Encounter: Payer: Self-pay | Admitting: Cardiovascular Disease

## 2019-02-09 ENCOUNTER — Other Ambulatory Visit: Payer: Self-pay

## 2019-02-09 ENCOUNTER — Ambulatory Visit (INDEPENDENT_AMBULATORY_CARE_PROVIDER_SITE_OTHER): Payer: Medicare Other | Admitting: Cardiovascular Disease

## 2019-02-09 VITALS — BP 137/66 | HR 54 | Ht 67.0 in | Wt 149.2 lb

## 2019-02-09 DIAGNOSIS — I4819 Other persistent atrial fibrillation: Secondary | ICD-10-CM

## 2019-02-09 DIAGNOSIS — R600 Localized edema: Secondary | ICD-10-CM | POA: Diagnosis not present

## 2019-02-09 DIAGNOSIS — Z79899 Other long term (current) drug therapy: Secondary | ICD-10-CM | POA: Diagnosis not present

## 2019-02-09 DIAGNOSIS — I10 Essential (primary) hypertension: Secondary | ICD-10-CM | POA: Diagnosis not present

## 2019-02-09 DIAGNOSIS — Z9889 Other specified postprocedural states: Secondary | ICD-10-CM | POA: Diagnosis not present

## 2019-02-09 NOTE — Progress Notes (Signed)
SUBJECTIVE: The patient presents for routine follow-up.  She has persistent atrial fibrillation and follows with [name].  Amiodarone was reduced to 100 mg every other day on 10/23/2018.  The patient denies any symptoms of chest pain, palpitations, shortness of breath, lightheadedness, dizziness, leg swelling, orthopnea, PND, and syncope.  She wears compression stockings.  She brought in her blood pressure log which I personally reviewed.  Overall there appears to be very good control.     Review of Systems: As per "subjective", otherwise negative.  Allergies  Allergen Reactions  . Rosuvastatin Other (See Comments)    MYALGIA, Muscle pain  . Lisinopril Cough  . Other Other (See Comments)    PAIN MEDICATIONS/ANESTHESIA  MADE BP DROP LOW   . Singulair [Montelukast Sodium] Other (See Comments)    fatigue    Current Outpatient Medications  Medication Sig Dispense Refill  . amiodarone (PACERONE) 100 MG tablet Take 1 tablet (100 mg total) by mouth every other day. 45 tablet 3  . amLODipine (NORVASC) 5 MG tablet Take 1 tablet (5 mg total) by mouth 2 (two) times daily. TAKE AT 8AM & 8PM 60 tablet 11  . Calcium Carbonate-Vitamin D (CALCIUM 600+D PO) Take 1 tablet by mouth daily.    Marland Kitchen ELIQUIS 5 MG TABS tablet TAKE (1) TABLET TWICE DAILY. 60 tablet 6  . estradiol (ESTRACE) 0.5 MG tablet Take 0.5 mg by mouth daily.     . fluticasone (FLONASE) 50 MCG/ACT nasal spray Place 2 sprays into both nostrils daily as needed for allergies.     . furosemide (LASIX) 20 MG tablet Take 1 tablet (20 mg total) by mouth as needed for edema (leg swelling / shortness of breath).    . Hypromellose (ARTIFICIAL TEARS OP) Place 2 drops into both eyes 2 (two) times daily as needed (for dry eyes).    Marland Kitchen losartan (COZAAR) 50 MG tablet TAKE 1 TABLET TWICE DAILY AT 10 AM AND 10 PM. 180 tablet 0  . magnesium hydroxide (MILK OF MAGNESIA) 400 MG/5ML suspension Take 30 mLs by mouth daily as needed for mild  constipation.    . metoprolol succinate (TOPROL-XL) 50 MG 24 hr tablet TAKE 1 TABLET BY MOUTH DAILY AT LUNCH WITH OR IMMEDIATELY FOLLOWING AMEAL. 30 tablet 6  . pantoprazole (PROTONIX) 40 MG tablet TAKE (1) TABLET TWICE DAILY. 60 tablet 11  . potassium chloride SA (K-DUR,KLOR-CON) 20 MEQ tablet Take 1 tablet (20 mEq total) by mouth as needed (take on days you take your Furosemide).    . vitamin B-12 (CYANOCOBALAMIN) 1000 MCG tablet Take 1,000 mcg by mouth daily.     No current facility-administered medications for this visit.     Past Medical History:  Diagnosis Date  . Arthritis    oa, all over - multiple areas   . Carotid artery occlusion   . Dysrhythmia    AFib   . GERD (gastroesophageal reflux disease)   . Headache    h/o migraines   . Hypertension     Past Surgical History:  Procedure Laterality Date  . ABDOMINAL HYSTERECTOMY    . APPENDECTOMY    . BACK SURGERY    . CARDIOVERSION N/A 12/19/2017   Procedure: CARDIOVERSION;  Surgeon: Arnoldo Lenis, MD;  [facility];  Service: Endoscopy;  Laterality: N/A;  . CARDIOVERSION N/A 12/26/2017   Procedure: CARDIOVERSION;  Surgeon: [name], MD;  Location: [facility] ENDOSCOPY;  Service: Cardiovascular;  Laterality: N/A;  . CARDIOVERSION N/A 01/26/2018  Procedure: CARDIOVERSION;  Surgeon: Jerline Pain, MD;  Location: [facility] ENDOSCOPY;  Service: Cardiovascular;  Laterality: N/A;  . CAROTID ENDARTERECTOMY    . ENDARTERECTOMY Left 03/27/2017   Procedure: ENDARTERECTOMY CAROTID LEFT;  Surgeon: [name], MD;  Location: [place];  Service: Vascular;  Laterality: Left;  . INNER EAR SURGERY Right    puntured eardrum- repaired 2x's   . NASAL SINUS SURGERY     deviated septum  . PATCH ANGIOPLASTY Left 03/27/2017   Procedure: PATCH ANGIOPLASTY LEFT CAROTID ARTERY USING Rueben Bash BIOLOGIC PATCH;  Surgeon: [name], MD;  Location: [place];  Service: Vascular;  Laterality: Left;  . TONSILLECTOMY    . TOTAL  KNEE ARTHROPLASTY Right 10/23/2016   Procedure: RIGHT TOTAL KNEE ARTHROPLASTY;  Surgeon: [name], MD;  Location: [place];  Service: Orthopedics;  Laterality: Right;    Social History   Socioeconomic History  . Marital status: Single    Spouse name: Not on file  . Number of children: Not on file  . Years of education: Not on file  . Highest education level: Not on file  Occupational History  . Not on file  Social Needs  . Financial resource strain: Not on file  . Food insecurity    Worry: Not on file    Inability: Not on file  . Transportation needs    Medical: Not on file    Non-medical: Not on file  Tobacco Use  . Smoking status: Never Smoker  . Smokeless tobacco: Never Used  Substance and Sexual Activity  . Alcohol use: No  . Drug use: No  . Sexual activity: Not Currently    Birth control/protection: Surgical  Lifestyle  . Physical activity    Days per week: Not on file    Minutes per session: Not on file  . Stress: Not on file  Relationships  . Social Herbalist on phone: Not on file    Gets together: Not on file    Attends religious service: Not on file    Active member of club or organization: Not on file    Attends meetings of clubs or organizations: Not on file    Relationship status: Not on file  . Intimate partner violence    Fear of current or ex partner: Not on file    Emotionally abused: Not on file    Physically abused: Not on file    Forced sexual activity: Not on file  Other Topics Concern  . Not on file  Social History Narrative  . Not on file     Vitals:   02/09/19 1050  BP: 137/66  Pulse: (!) 54  SpO2: 99%  Weight: 149 lb 3.2 oz (67.7 kg)  Height: 5\' 7"  (1.702 m)    Wt Readings from Last 3 Encounters:  02/09/19 149 lb 3.2 oz (67.7 kg)  12/15/18 144 lb (65.3 kg)  10/23/18 144 lb (65.3 kg)     PHYSICAL EXAM General: NAD HEENT: Normal. Neck: No JVD, no thyromegaly. Lungs: Clear to auscultation bilaterally with  normal respiratory effort. CV: Regular rate and rhythm, normal S1/S2, no S3/S4, no murmur. No pretibial or periankle edema.  Wearing compression stockings.  No carotid bruit.   Abdomen: Soft, nontender, no distention.  Neurologic: Alert and oriented.  Psych: Normal affect. Skin: Normal. Musculoskeletal: No gross deformities.    ECG: Reviewed above under Subjective   Labs: Lab Results  Component Value Date/Time   K 5.2 06/03/2018 11:54 AM  BUN 21 06/03/2018 11:54 AM   CREATININE 1.13 (H) 06/03/2018 11:54 AM   ALT 42 12/26/2017 02:22 PM   TSH 1.601 12/24/2017 02:31 PM   HGB 15.4 (H) 01/19/2018 12:50 PM     Lipids: No results found for: LDLCALC, LDLDIRECT, CHOL, TRIG, HDL     ASSESSMENT AND PLAN:  1.  Persistent atrial fibrillation: She takes amiodarone 100 mg every other day as recommended by [name] in May 2020.  Anticoagulated with Eliquis. She denies palpitations. Consider ablation if she has recurrence. Unremarkable TSH and liver transaminases on 07/29/2018.  I will repeat TSH and LFTs.  2.  Accelerated hypertension: Blood pressure is normal today.  She is on multiple antihypertensive agents.  I reviewed her blood pressure log which demonstrates overall good control.  No changes to therapy today.  3. High-grade left internal carotid artery stenosis status post carotid endarterectomy in October 2018: Stable.She is on Eliquis for atrial fibrillation.  Carotid Dopplers on 04/24/2018 showed bilateral 1 to 39% internal carotid artery stenosis.  4.  Bilateral leg edema/shortness of breath: Euvolemic.  She takes Lasix and potassium as needed. She is wearing compression stockings.    Disposition: Follow up 6 months   Kate Sable, M.D., F.A.C.C.

## 2019-02-09 NOTE — Patient Instructions (Signed)
Your physician recommends that you schedule a follow-up appointment in: [place]  Your physician recommends that you continue on your current medications as directed. Please refer to the Current Medication list given to you today.  Your physician recommends that you return for lab work TSH/LFT  Thank you for choosing [facility]!!

## 2019-02-11 ENCOUNTER — Telehealth: Payer: Self-pay | Admitting: *Deleted

## 2019-02-11 NOTE — Telephone Encounter (Signed)
Patient informed. Copy sent to PCP °

## 2019-02-11 NOTE — Telephone Encounter (Signed)
-----   Message from Herminio Commons, MD sent at 02/09/2019  3:56 PM EDT ----- Normal liver and thyroid function.

## 2019-02-15 ENCOUNTER — Other Ambulatory Visit: Payer: Self-pay | Admitting: Nurse Practitioner

## 2019-03-23 ENCOUNTER — Other Ambulatory Visit: Payer: Self-pay | Admitting: Cardiovascular Disease

## 2019-03-30 ENCOUNTER — Other Ambulatory Visit: Payer: Self-pay | Admitting: Adult Health

## 2019-04-06 DIAGNOSIS — L570 Actinic keratosis: Secondary | ICD-10-CM | POA: Diagnosis not present

## 2019-04-06 DIAGNOSIS — Z85828 Personal history of other malignant neoplasm of skin: Secondary | ICD-10-CM | POA: Diagnosis not present

## 2019-04-13 DIAGNOSIS — Z1231 Encounter for screening mammogram for malignant neoplasm of breast: Secondary | ICD-10-CM | POA: Diagnosis not present

## 2019-04-13 DIAGNOSIS — Z6823 Body mass index (BMI) 23.0-23.9, adult: Secondary | ICD-10-CM | POA: Diagnosis not present

## 2019-04-13 DIAGNOSIS — Z01419 Encounter for gynecological examination (general) (routine) without abnormal findings: Secondary | ICD-10-CM | POA: Diagnosis not present

## 2019-04-20 ENCOUNTER — Other Ambulatory Visit (HOSPITAL_COMMUNITY): Payer: Self-pay | Admitting: Nurse Practitioner

## 2019-04-22 ENCOUNTER — Telehealth: Payer: Self-pay | Admitting: Physical Medicine and Rehabilitation

## 2019-04-22 NOTE — Telephone Encounter (Signed)
Oh yes

## 2019-04-23 DIAGNOSIS — Z1339 Encounter for screening examination for other mental health and behavioral disorders: Secondary | ICD-10-CM | POA: Diagnosis not present

## 2019-04-23 DIAGNOSIS — R5383 Other fatigue: Secondary | ICD-10-CM | POA: Diagnosis not present

## 2019-04-23 DIAGNOSIS — Z1331 Encounter for screening for depression: Secondary | ICD-10-CM | POA: Diagnosis not present

## 2019-04-23 DIAGNOSIS — Z299 Encounter for prophylactic measures, unspecified: Secondary | ICD-10-CM | POA: Diagnosis not present

## 2019-04-23 DIAGNOSIS — Z7189 Other specified counseling: Secondary | ICD-10-CM | POA: Diagnosis not present

## 2019-04-23 DIAGNOSIS — I4891 Unspecified atrial fibrillation: Secondary | ICD-10-CM | POA: Diagnosis not present

## 2019-04-23 DIAGNOSIS — E785 Hyperlipidemia, unspecified: Secondary | ICD-10-CM | POA: Diagnosis not present

## 2019-04-23 DIAGNOSIS — I10 Essential (primary) hypertension: Secondary | ICD-10-CM | POA: Diagnosis not present

## 2019-04-23 DIAGNOSIS — Z0000 Encounter for general adult medical examination without abnormal findings: Secondary | ICD-10-CM | POA: Diagnosis not present

## 2019-04-23 DIAGNOSIS — Z1211 Encounter for screening for malignant neoplasm of colon: Secondary | ICD-10-CM | POA: Diagnosis not present

## 2019-04-23 DIAGNOSIS — Z6824 Body mass index (BMI) 24.0-24.9, adult: Secondary | ICD-10-CM | POA: Diagnosis not present

## 2019-04-23 DIAGNOSIS — Z79899 Other long term (current) drug therapy: Secondary | ICD-10-CM | POA: Diagnosis not present

## 2019-04-23 NOTE — Telephone Encounter (Signed)
Scheduled for 11/13 at 1000.

## 2019-04-30 ENCOUNTER — Encounter: Payer: Medicare Other | Admitting: Internal Medicine

## 2019-05-07 ENCOUNTER — Ambulatory Visit: Payer: Medicare Other | Admitting: Physical Medicine and Rehabilitation

## 2019-05-12 ENCOUNTER — Encounter (HOSPITAL_COMMUNITY): Payer: Medicare Other

## 2019-05-12 ENCOUNTER — Ambulatory Visit: Payer: Medicare Other | Admitting: Family

## 2019-05-26 ENCOUNTER — Encounter: Payer: Self-pay | Admitting: Physical Medicine and Rehabilitation

## 2019-05-26 ENCOUNTER — Ambulatory Visit: Payer: Self-pay

## 2019-05-26 ENCOUNTER — Other Ambulatory Visit: Payer: Self-pay | Admitting: Cardiovascular Disease

## 2019-05-26 ENCOUNTER — Ambulatory Visit (INDEPENDENT_AMBULATORY_CARE_PROVIDER_SITE_OTHER): Payer: Medicare Other | Admitting: Physical Medicine and Rehabilitation

## 2019-05-26 ENCOUNTER — Other Ambulatory Visit: Payer: Self-pay

## 2019-05-26 DIAGNOSIS — M5416 Radiculopathy, lumbar region: Secondary | ICD-10-CM

## 2019-05-26 DIAGNOSIS — L570 Actinic keratosis: Secondary | ICD-10-CM | POA: Diagnosis not present

## 2019-05-26 DIAGNOSIS — D485 Neoplasm of uncertain behavior of skin: Secondary | ICD-10-CM | POA: Diagnosis not present

## 2019-05-26 DIAGNOSIS — M7061 Trochanteric bursitis, right hip: Secondary | ICD-10-CM

## 2019-05-26 DIAGNOSIS — M419 Scoliosis, unspecified: Secondary | ICD-10-CM

## 2019-05-26 DIAGNOSIS — M4316 Spondylolisthesis, lumbar region: Secondary | ICD-10-CM | POA: Diagnosis not present

## 2019-05-26 MED ORDER — TRIAMCINOLONE ACETONIDE 40 MG/ML IJ SUSP
60.0000 mg | INTRAMUSCULAR | Status: AC | PRN
  Administered 2019-05-26: 60 mg via INTRA_ARTICULAR

## 2019-05-26 MED ORDER — BUPIVACAINE HCL 0.25 % IJ SOLN
4.0000 mL | INTRAMUSCULAR | Status: AC | PRN
  Administered 2019-05-26: 13:00:00 4 mL via INTRA_ARTICULAR

## 2019-05-26 NOTE — Progress Notes (Signed)
Lydia Graham - 83 y.o. female MRN [id number]  Date of birth: 1936/01/02  Office Visit Note: Visit Date: 05/26/2019 PCP: Glenda Chroman, MD Referred by: Glenda Chroman, MD  Subjective: Chief Complaint  Patient presents with  . Right Hip - Pain   HPI: Lydia Graham is a 83 y.o. female who comes in today For evaluation management of right low back pain and right posterior lateral posterior hip pain into the thigh.  Last time I saw her was in May 2019 and completed greater trochanteric injection fluoroscopic guidance with some relief of her symptoms.  She reports increasing symptoms recently having to take care of her 2 older sisters.  Patient is 41 years old herself that is still active.  She has a history of lumbar scoliosis as well as lateral recess narrowing and disc protrusion with MRI from 2015.  Epidural injections at the time were somewhat beneficial but as of late she had more symptoms consistent with trochanteric bursitis.  No left-sided complaints no focal weakness.  She has had no Lydia trauma or falls.  Really no Lydia medical issues.  She reports insidious onset over the last several months of right posterior lateral hip pain worse with sitting and laying on that side but also worse with standing and ambulating and activity.  Does use medication as well as heating pad for relief.  Has had physical therapy in the past does try to stay active.  Review of Systems  Constitutional: Negative for chills, fever, malaise/fatigue and weight loss.  HENT: Negative for hearing loss and sinus pain.   Eyes: Negative for blurred vision, double vision and photophobia.  Respiratory: Negative for cough and shortness of breath.   Cardiovascular: Negative for chest pain, palpitations and leg swelling.  Gastrointestinal: Negative for abdominal pain, nausea and vomiting.  Genitourinary: Negative for flank pain.  Musculoskeletal: Positive for back pain and joint pain. Negative for myalgias.  Skin:  Negative for itching and rash.  Neurological: Negative for tremors, focal weakness and weakness.  Endo/Heme/Allergies: Negative.   Psychiatric/Behavioral: Negative for depression.  All other systems reviewed and are negative.  Otherwise per HPI.  Assessment & Plan: Visit Diagnoses:  1. Greater trochanteric bursitis, right   2. Lumbar radiculopathy   3. Scoliosis of lumbar spine, unspecified scoliosis type   4. Spondylolisthesis of lumbar region     Plan: Findings:  Chronic worsening severe at times she rates her pain as a 10 out of 10 but does not seem to be hurting that bad in the office.  She says it does limit her activities and it is worse with trying to do a lot of activities that she is having to do at this point.  I think she has a combination of gluteus medius tendinosis or possibly bursitis along with likely potential for radicular type pain from lateral recess narrowing or perhaps disc protrusion.  At this point I would complete diagnostic greater trochanteric bursa injection fluoroscopic guidance incident at to help in the past.  She has nothing past the knee so it is consistent.  If she does not get much relief would look at repeat MRI of the lumbar spine.  Last MRI was in 2015.  Should continue with current home exercise and modalities.    Meds & Orders: No orders of the defined types were placed in this encounter.   Orders Placed This Encounter  Procedures  . Large Joint Inj  . XR C-ARM NO REPORT    Follow-up:  Return if symptoms worsen or fail to improve.   Procedures: Large Joint Inj: R greater trochanter on 05/26/2019 12:39 PM Indications: pain and diagnostic evaluation Details: 22 G 3.5 in needle, fluoroscopy-guided lateral approach  Arthrogram: No  Medications: 4 mL bupivacaine 0.25 %; 60 mg triamcinolone acetonide 40 MG/ML Outcome: tolerated well, no immediate complications  There was excellent flow of contrast outlined the greater trochanteric bursa without  vascular uptake. Procedure, treatment alternatives, risks and benefits explained, specific risks discussed. Consent was given by the patient. Immediately prior to procedure a time out was called to verify the correct patient, procedure, equipment, support staff and site/side marked as required. Patient was prepped and draped in the usual sterile fashion.      No notes on file   Clinical History: FINDINGS: There is 13 degrees of levoconvex lumbar scoliosis between L5 and L2. Lipoma of the a left lateral abdominal wall musculature incidentally noted.  The lowest lumbar type non-rib-bearing vertebra is labeled as L5. The conus medullaris appears normal. Conus level: L1-2. Suspected postoperative findings on the right at L3-4.  There is disc desiccation throughout the lumbar spine. Type 1 degenerative endplate findings at [id number] with type 2 degenerative endplate findings at [id number]-GRM.  There is 3 mm of grade 1 anterolisthesis at L3-4 and 3 mm retrolisthesis at L4-5.  The leftward scoliosis has a significant rotary component.  Small central disc protrusion at T12-L1 without impingement. Additional findings at individual levels are as follows:  L1-2: No impingement. Left lateral recess and inferior foraminal disc protrusion.  L2-3: Mild displacement of the left L2 nerve in the lateral extraforaminal space due to left lateral extraforaminal disc protrusion.  L3-4: Moderate right foraminal stenosis and mild right subarticular lateral recess stenosis due to disc uncovering, right lateral recess and foraminal disc protrusion, and facet arthropathy. There is also a left paracentral disc protrusion.  L4-5: Mild left foraminal stenosis along with mild left and borderline right subarticular lateral recess stenosis due to disc bulge and facet and intervertebral spurring.  L5-S1: No impingement. Small bilateral synovial cysts are best observed on the axial images.  IMPRESSION: 1.  Lumbar spondylosis and degenerative disc disease, causing moderate impingement at L3-4 and mild impingement at L2-3 and L4-5, as detailed above. 2. Levoconvex lumbar scoliosis with rotary component.   Electronically Signed   By: Sherryl Barters M.D.   On: 11/02/2013 11:29   She reports that she has never smoked. She has never used smokeless tobacco. No results for input(s): HGBA1C, LABURIC in the last 8760 hours.  Objective:  VS:  HT:    WT:   BMI:     BP:   HR: bpm  TEMP: ( )  RESP:  Physical Exam Vitals signs and nursing note reviewed.  Constitutional:      General: She is not in acute distress.    Appearance: Normal appearance. She is well-developed. She is not ill-appearing.  HENT:     Head: Normocephalic and atraumatic.  Eyes:     Conjunctiva/sclera: Conjunctivae normal.     Pupils: Pupils are equal, round, and reactive to light.  Cardiovascular:     Rate and Rhythm: Normal rate.     Pulses: Normal pulses.  Pulmonary:     Effort: Pulmonary effort is normal.  Musculoskeletal:     Right lower leg: No edema.     Left lower leg: No edema.     Comments: Patient ambulates without aid somewhat antalgic gait to the right.  She is using  a cane for balance.  She has good distal strength without clonus.  She does have pain over the gluteus medius tendon insertion more than the greater trochanter but in general over the right side.  None on the left.  No pain with hip rotation  Skin:    General: Skin is warm and dry.     Findings: No erythema or rash.  Neurological:     General: No focal deficit present.     Mental Status: She is alert and oriented to person, place, and time.     Sensory: No sensory deficit.     Motor: No abnormal muscle tone.     Coordination: Coordination normal.     Gait: Gait normal.  Psychiatric:        Mood and Affect: Mood normal.        Behavior: Behavior normal.     Ortho Exam Imaging: Xr C-arm No Report  Result Date: 05/26/2019 Please  see Notes tab for imaging impression.   Past Medical/Family/Surgical/Social History: Medications & Allergies reviewed per EMR, Lydia medications updated. Patient Active Problem List   Diagnosis Date Noted  . PAF (paroxysmal atrial fibrillation) ([place])   . Chronic cough 12/29/2017  . Atrial fibrillation with rapid ventricular response ([place]) 12/23/2017  . Acute on chronic combined systolic and diastolic CHF (congestive heart failure) ([place])   . Menopausal symptom 10/17/2017  . Menopausal syndrome 10/17/2017  . Carotid stenosis 03/27/2017  . S/P total knee arthroplasty, right 10/23/2016  . Unilateral primary osteoarthritis, right knee 07/13/2016   Past Medical History:  Diagnosis Date  . Arthritis    oa, all over - multiple areas   . Carotid artery occlusion   . Dysrhythmia    AFib   . GERD (gastroesophageal reflux disease)   . Headache    h/o migraines   . Hypertension    Family History  Problem Relation Age of Onset  . Stroke Mother    Past Surgical History:  Procedure Laterality Date  . ABDOMINAL HYSTERECTOMY    . APPENDECTOMY    . BACK SURGERY    . CARDIOVERSION N/A 12/19/2017   Procedure: CARDIOVERSION;  Surgeon: Arnoldo Lenis, MD;  [facility];  Service: Endoscopy;  Laterality: N/A;  . CARDIOVERSION N/A 12/26/2017   Procedure: CARDIOVERSION;  Surgeon: [name], MD;  Location: [facility] ENDOSCOPY;  Service: Cardiovascular;  Laterality: N/A;  . CARDIOVERSION N/A 01/26/2018   Procedure: CARDIOVERSION;  Surgeon: Jerline Pain, MD;  Location: [facility] ENDOSCOPY;  Service: Cardiovascular;  Laterality: N/A;  . CAROTID ENDARTERECTOMY    . ENDARTERECTOMY Left 03/27/2017   Procedure: ENDARTERECTOMY CAROTID LEFT;  Surgeon: [name], MD;  Location: [place];  Service: Vascular;  Laterality: Left;  . INNER EAR SURGERY Right    puntured eardrum- repaired 2x's   . NASAL SINUS SURGERY     deviated septum  . PATCH ANGIOPLASTY Left 03/27/2017   Procedure: PATCH  ANGIOPLASTY LEFT CAROTID ARTERY USING Rueben Bash BIOLOGIC PATCH;  Surgeon: [name], MD;  Location: [place];  Service: Vascular;  Laterality: Left;  . TONSILLECTOMY    . TOTAL KNEE ARTHROPLASTY Right 10/23/2016   Procedure: RIGHT TOTAL KNEE ARTHROPLASTY;  Surgeon: [name], MD;  Location: [place];  Service: Orthopedics;  Laterality: Right;   Social History   Occupational History  . Not on file  Tobacco Use  . Smoking status: Never Smoker  . Smokeless tobacco: Never Used  Substance and Sexual Activity  . Alcohol use:  No  . Drug use: No  . Sexual activity: Not Currently    Birth control/protection: Surgical

## 2019-05-26 NOTE — Progress Notes (Signed)
  Numeric Pain Rating Scale and Functional Assessment Average Pain 10   In the last MONTH (on 0-10 scale) has pain interfered with the following?  1. General activity like being  able to carry out your everyday physical activities such as walking, climbing stairs, carrying groceries, or moving a chair?  Rating(10)    -Dye Allergies. 

## 2019-05-28 ENCOUNTER — Other Ambulatory Visit: Payer: Self-pay

## 2019-05-28 DIAGNOSIS — I6522 Occlusion and stenosis of left carotid artery: Secondary | ICD-10-CM

## 2019-05-31 ENCOUNTER — Telehealth: Payer: Self-pay | Admitting: *Deleted

## 2019-05-31 ENCOUNTER — Ambulatory Visit (HOSPITAL_COMMUNITY)
Admission: RE | Admit: 2019-05-31 | Discharge: 2019-05-31 | Disposition: A | Discharge status: Home / Self Care | Payer: Medicare Other | Source: Ambulatory Visit | Attending: Family | Admitting: Family

## 2019-05-31 ENCOUNTER — Encounter: Payer: Self-pay | Admitting: Family

## 2019-05-31 ENCOUNTER — Other Ambulatory Visit: Payer: Self-pay

## 2019-05-31 ENCOUNTER — Ambulatory Visit (INDEPENDENT_AMBULATORY_CARE_PROVIDER_SITE_OTHER): Payer: Medicare Other | Admitting: Family

## 2019-05-31 VITALS — BP 143/73 | HR 62 | Temp 96.4°F | Resp 14 | Ht 67.0 in | Wt 144.0 lb

## 2019-05-31 DIAGNOSIS — I6522 Occlusion and stenosis of left carotid artery: Secondary | ICD-10-CM | POA: Diagnosis not present

## 2019-05-31 DIAGNOSIS — Z9889 Other specified postprocedural states: Secondary | ICD-10-CM | POA: Diagnosis not present

## 2019-05-31 NOTE — Patient Instructions (Signed)

## 2019-05-31 NOTE — Progress Notes (Signed)
Virtual Visit via Telephone Note  I connected with [name] on 05/31/2019 using the [employer] by telephone and verified that I was speaking with the correct person using two identifiers. Patient was located at her home and accompanied by herself. I am located at the [facility] office/clinic.   The limitations of evaluation and management by telemedicine and the availability of in person appointments have been previously discussed with the patient and are documented in the patients chart. The patient expressed understanding and consented to proceed.  PCP: Glenda Chroman, MD  Chief Complaint: Follow up extracranial carotid artery stenosis   History of Present Illness: Lydia Graham is a 83 y.o. female whois status post left carotid endarterectomy on 10-4-18by Dr. Jason Fila asymptomatic very high-grade stenosis.   She has no neurologic symptoms.  She denies any known history of stroke or TIA. Specificallyshe deniesa history of amaurosis fugax or monocular blindness, unilateral facial drooping, hemiplegia, orreceptive or expressive aphasia.   She has had 4 cardioversions, and states the last one worked.  She is concerned about her son having a third recurrence of Hodgkin's Disease.  Pt denies chest pain, denies dyspnea. She denies fever or chills.    Diabetic:no Tobacco use:non-smoker  Pt meds include: Statin :no, is statin intolerant, caused myalgias ASA:no Other anticoagulants/antiplatelets:Eliquis for atrial fib, but is making her tired   Past Medical History:  Diagnosis Date  . Arthritis    oa, all over - multiple areas   . Carotid artery occlusion   . Dysrhythmia    AFib   . GERD (gastroesophageal reflux disease)   . Headache    h/o migraines   . Hypertension     Past Surgical History:  Procedure Laterality Date  . ABDOMINAL HYSTERECTOMY    . APPENDECTOMY    . BACK SURGERY    . CARDIOVERSION N/A 12/19/2017   Procedure: CARDIOVERSION;   Surgeon: Arnoldo Lenis, MD;  [facility];  Service: Endoscopy;  Laterality: N/A;  . CARDIOVERSION N/A 12/26/2017   Procedure: CARDIOVERSION;  Surgeon: [name], MD;  Location: [facility] ENDOSCOPY;  Service: Cardiovascular;  Laterality: N/A;  . CARDIOVERSION N/A 01/26/2018   Procedure: CARDIOVERSION;  Surgeon: Jerline Pain, MD;  Location: [facility] ENDOSCOPY;  Service: Cardiovascular;  Laterality: N/A;  . CAROTID ENDARTERECTOMY    . ENDARTERECTOMY Left 03/27/2017   Procedure: ENDARTERECTOMY CAROTID LEFT;  Surgeon: [name], MD;  Location: [place];  Service: Vascular;  Laterality: Left;  . INNER EAR SURGERY Right    puntured eardrum- repaired 2x's   . NASAL SINUS SURGERY     deviated septum  . PATCH ANGIOPLASTY Left 03/27/2017   Procedure: PATCH ANGIOPLASTY LEFT CAROTID ARTERY USING Rueben Bash BIOLOGIC PATCH;  Surgeon: [name], MD;  Location: [place];  Service: Vascular;  Laterality: Left;  . TONSILLECTOMY    . TOTAL KNEE ARTHROPLASTY Right 10/23/2016   Procedure: RIGHT TOTAL KNEE ARTHROPLASTY;  Surgeon: [name], MD;  Location: [place];  Service: Orthopedics;  Laterality: Right;    Current Meds  Medication Sig  . amiodarone (PACERONE) 100 MG tablet Take 1 tablet (100 mg total) by mouth every other day.  Marland Kitchen amiodarone (PACERONE) 200 MG tablet Take 0.5 tablets (100 mg total) by mouth every other day.  Marland Kitchen amLODipine (NORVASC) 5 MG tablet Take 1 tablet (5 mg total) by mouth 2 (two) times daily. TAKE AT 8AM & 8PM  . Calcium Carbonate-Vitamin D (CALCIUM 600+D PO) Take 1 tablet by  mouth daily.  Marland Kitchen ELIQUIS 5 MG TABS tablet TAKE (1) TABLET TWICE DAILY.  Marland Kitchen estradiol (ESTRACE) 0.5 MG tablet Take 0.5 mg by mouth daily.   . fluticasone (FLONASE) 50 MCG/ACT nasal spray Place 2 sprays into both nostrils daily as needed for allergies.   . furosemide (LASIX) 20 MG tablet Take 1 tablet (20 mg total) by mouth as needed for edema (leg swelling / shortness of breath).  .  Hypromellose (ARTIFICIAL TEARS OP) Place 2 drops into both eyes 2 (two) times daily as needed (for dry eyes).  Marland Kitchen losartan (COZAAR) 50 MG tablet TAKE 1 TABLET TWICE DAILY 10 AM AND 10 PM  . magnesium hydroxide (MILK OF MAGNESIA) 400 MG/5ML suspension Take 30 mLs by mouth daily as needed for mild constipation.  . metoprolol succinate (TOPROL-XL) 50 MG 24 hr tablet TAKE 1 TABLET AT LUNCH OR IMMEDIATELY FOLLOWING A MEAL.  . pantoprazole (PROTONIX) 40 MG tablet TAKE (1) TABLET TWICE DAILY.  Marland Kitchen potassium chloride SA (K-DUR,KLOR-CON) 20 MEQ tablet Take 1 tablet (20 mEq total) by mouth as needed (take on days you take your Furosemide).  . vitamin B-12 (CYANOCOBALAMIN) 1000 MCG tablet Take 1,000 mcg by mouth daily.    12 system ROS was negative unless otherwise noted in HPI   Observations/Objective:  DATA Carotid Duplex (04-24-18): Right Carotid: Velocities in the right ICA are consistent with a 1-39% stenosis.                Non-hemodynamically significant plaque <50% noted in the CCA. The                ECA appears <50% stenosed. Left Carotid: Non-hemodynamically significant plaque <50% noted in the CCA. The               ECA appears <50% stenosed. Patent left carotid endarterectomy               without evidence of restenosis. Vertebrals:  Bilateral vertebral arteries demonstrate antegrade flow. Subclavians: Bilateral subclavian artery flow was disturbed. No significant change compared to the exam on 10-17-17 and 04-24-18.    Assessment and Plan: [name] is a 83 y.o. female who is status post left carotid endarterectomy on 03-27-17. She has no history of stroke or TIA.   Today's carotid duplex shows 1-39% right ICA stenosis and no stenosis in the left ICA.   Fortunately she has never used tobacco and does not have DM. She is statin intolerant.  She takes Eliquis for atrial fib, stroke prophylaxis.   Follow Up Instructions:   Follow up in 1 year with carotid duplex.    I  discussed the assessment and treatment plan with the patient. The patient was provided an opportunity to ask questions and all were answered. The patient agreed with the plan and demonstrated an understanding of the instructions.   The patient was advised to call back or seek an in-person evaluation if the symptoms worsen or if the condition fails to improve as anticipated.  I spent 10 minutes with the patient via telephone encounter.   Gabrielle Dare  [facility] Office: [phone]  05/31/2019, 4:24 PM

## 2019-05-31 NOTE — Telephone Encounter (Signed)
Virtual Visit Pre-Appointment Phone Call  Today, I spoke with Lydia Graham and performed the following actions:  1. I explained that we are currently trying to limit exposure to the COVID-19 virus by seeing patients at home rather than in the office.  I explained that the visits are best done by video, but can be done by telephone.  I asked the patient if a virtual visit that the patient would like to try instead of coming into the office. Lydia Graham agreed to proceed with the virtual visit scheduled with Lydia Level Nickel NP on 05/31/19.      2. I confirmed the BEST phone number to call the day of the visit and- I included this in appointment notes.  3. I asked if the patient had access to (through a family member/friend) a smartphone with video capability to be used for her visit?"  The patient said yes -        4. I confirmed consent by  a. sending through [place] or by email the [place] as written at the end of this message or  b. verbally as listed below. i. This visit is being performed in the setting of COVID-19. ii. All virtual visits are billed to your insurance company just like a normal visit would be.   iii. We'd like you to understand that the technology does not allow for your provider to perform an examination, and thus may limit your provider's ability to fully assess your condition.  iv. If your provider identifies any concerns that need to be evaluated in person, we will make arrangements to do so.   v. Finally, though the technology is pretty good, we cannot assure that it will always work on either your or our end, and in the setting of a video visit, we may have to convert it to a phone-only visit.  In either situation, we cannot ensure that we have a secure connection.   vi. Are you willing to proceed?"  STAFF: Did the patient verbally acknowledge consent to telehealth visit? Document YES/NO here: YES  2. I advised the  patient to be prepared - I asked that the patient, on the day of her visit, record any information possible with the equipment at her home, such as blood pressure, pulse, oxygen saturation, and your weight and write them all down. I asked the patient to have a pen and paper handy nearby the day of the visit as well.  3. If the patient was scheduled for a video visit, I informed the patient that the visit with the doctor would start with a text to the smartphone # given to [place] by the patient.         If the patient was scheduled for a telephone call, I informed the patient that the visit with the doctor would start with a call to the telephone # given to [place] by the patient.  4. I Informed patient they will receive a phone call 15 minutes prior to their appointment time from a [place] or nurse to review medications, allergies, etc. to prepare for the visit.    TELEPHONE CALL NOTE  Lydia [place] has been deemed a candidate for a follow-up tele-health visit to limit community exposure during the Covid-19 pandemic. I spoke with the patient via phone to ensure availability of phone/video source, confirm preferred email & phone number, and discuss instructions and expectations.  I reminded Lydia Marceil to be  prepared with any vital sign and/or heart rhythm information that could potentially be obtained via home monitoring, at the time of her visit. I reminded Lydia Royelle to expect a phone call prior to her visit.  Cleaster Corin, NT 05/31/2019 2:24 PM     FULL LENGTH CONSENT FOR TELE-HEALTH VISIT   I hereby voluntarily request, consent and authorize [facility] and its employed or contracted physicians, physician assistants, nurse practitioners or other licensed health care professionals (the Practitioner), to provide me with telemedicine health care services (the "Services") as deemed necessary by the treating Practitioner. I acknowledge and consent to receive the Services by the  Practitioner via telemedicine. I understand that the telemedicine visit will involve communicating with the Practitioner through live audiovisual communication technology and the disclosure of certain medical information by electronic transmission. I acknowledge that I have been given the opportunity to request an in-person assessment or other available alternative prior to the telemedicine visit and am voluntarily participating in the telemedicine visit.  I understand that I have the right to withhold or withdraw my consent to the use of telemedicine in the course of my care at any time, without affecting my right to future care or treatment, and that the Practitioner or I may terminate the telemedicine visit at any time. I understand that I have the right to inspect all information obtained and/or recorded in the course of the telemedicine visit and may receive copies of available information for a reasonable fee.  I understand that some of the potential risks of receiving the Services via telemedicine include:  Marland Kitchen Delay or interruption in medical evaluation due to technological equipment failure or disruption; . Information transmitted may not be sufficient (e.g. poor resolution of images) to allow for appropriate medical decision making by the Practitioner; and/or  . In rare instances, security protocols could fail, causing a breach of personal health information.  Furthermore, I acknowledge that it is my responsibility to provide information about my medical history, conditions and care that is complete and accurate to the best of my ability. I acknowledge that Practitioner's advice, recommendations, and/or decision may be based on factors not within their control, such as incomplete or inaccurate data provided by me or distortions of diagnostic images or specimens that may result from electronic transmissions. I understand that the practice of medicine is not an exact science and that Practitioner makes  no warranties or guarantees regarding treatment outcomes. I acknowledge that I will receive a copy of this consent concurrently upon execution via email to the email address I last provided but may also request a printed copy by calling the office of [place].    I understand that my insurance will be billed for this visit.   I have read or had this consent read to me. . I understand the contents of this consent, which adequately explains the benefits and risks of the Services being provided via telemedicine.  . I have been provided ample opportunity to ask questions regarding this consent and the Services and have had my questions answered to my satisfaction. . I give my informed consent for the services to be provided through the use of telemedicine in my medical care  By participating in this telemedicine visit I agree to the above.

## 2019-06-10 ENCOUNTER — Other Ambulatory Visit: Payer: Self-pay | Admitting: Adult Health

## 2019-06-10 NOTE — Telephone Encounter (Signed)
Please review for refill. Thanks!  

## 2019-06-16 ENCOUNTER — Telehealth: Payer: Self-pay | Admitting: Internal Medicine

## 2019-06-16 NOTE — Telephone Encounter (Signed)
Attempted to reach on cell [phone], had to leave message-cc

## 2019-06-16 NOTE — Telephone Encounter (Signed)
Patient called stating that her heart rate has started going up again. States that she is having sweats. Has questions about her amiodarone

## 2019-06-16 NOTE — Telephone Encounter (Signed)
LMTCB-cc 

## 2019-06-17 NOTE — Telephone Encounter (Signed)
Called pt. No answer, left message for pt to return call.  

## 2019-06-17 NOTE — Telephone Encounter (Signed)
Pt returned call. Pt states she was assuming it was her amiodarone, but it was symptoms of covid. She tested positive yesterday.

## 2019-06-21 ENCOUNTER — Other Ambulatory Visit: Payer: Self-pay | Admitting: *Deleted

## 2019-06-21 DIAGNOSIS — Z9889 Other specified postprocedural states: Secondary | ICD-10-CM

## 2019-07-02 ENCOUNTER — Encounter: Payer: Medicare Other | Admitting: Internal Medicine

## 2019-07-10 ENCOUNTER — Other Ambulatory Visit: Payer: Self-pay | Admitting: Adult Health

## 2019-07-30 ENCOUNTER — Ambulatory Visit (INDEPENDENT_AMBULATORY_CARE_PROVIDER_SITE_OTHER): Payer: Medicare Other | Admitting: Internal Medicine

## 2019-07-30 ENCOUNTER — Encounter: Payer: Self-pay | Admitting: Internal Medicine

## 2019-07-30 ENCOUNTER — Other Ambulatory Visit: Payer: Self-pay

## 2019-07-30 VITALS — BP 145/74 | HR 68 | Ht 67.0 in | Wt 150.2 lb

## 2019-07-30 DIAGNOSIS — D6869 Other thrombophilia: Secondary | ICD-10-CM | POA: Diagnosis not present

## 2019-07-30 DIAGNOSIS — I119 Hypertensive heart disease without heart failure: Secondary | ICD-10-CM | POA: Diagnosis not present

## 2019-07-30 DIAGNOSIS — I4819 Other persistent atrial fibrillation: Secondary | ICD-10-CM | POA: Diagnosis not present

## 2019-07-30 NOTE — Patient Instructions (Signed)

## 2019-07-30 NOTE — Addendum Note (Signed)
Addended by: Laurine Blazer on: 07/30/2019 03:47 PM   Modules accepted: Orders

## 2019-07-30 NOTE — Progress Notes (Signed)
PCP: Glenda Chroman, MD Primary Cardiologist: Dr Bronson Ing Primary EP: Dr Rayann Heman  Lydia Graham is a 84 y.o. female who presents today for routine electrophysiology followup.  Since last being seen in our clinic, the patient reports doing very well.  She has COVID in December but made a good recovery.  Today, she denies symptoms of palpitations, chest pain, shortness of breath,  lower extremity edema, dizziness, presyncope, or syncope.  The patient is otherwise without complaint today.   Past Medical History:  Diagnosis Date  . Arthritis    oa, all over - multiple areas   . Carotid artery occlusion   . Dysrhythmia    AFib   . GERD (gastroesophageal reflux disease)   . Headache    h/o migraines   . Hypertension    Past Surgical History:  Procedure Laterality Date  . ABDOMINAL HYSTERECTOMY    . APPENDECTOMY    . BACK SURGERY    . CARDIOVERSION N/A 12/19/2017   Procedure: CARDIOVERSION;  Surgeon: Arnoldo Lenis, MD;  [facility];  Service: Endoscopy;  Laterality: N/A;  . CARDIOVERSION N/A 12/26/2017   Procedure: CARDIOVERSION;  Surgeon: [name], MD;  Location: [facility] ENDOSCOPY;  Service: Cardiovascular;  Laterality: N/A;  . CARDIOVERSION N/A 01/26/2018   Procedure: CARDIOVERSION;  Surgeon: Jerline Pain, MD;  Location: [facility] ENDOSCOPY;  Service: Cardiovascular;  Laterality: N/A;  . CAROTID ENDARTERECTOMY    . ENDARTERECTOMY Left 03/27/2017   Procedure: ENDARTERECTOMY CAROTID LEFT;  Surgeon: [name], MD;  Location: [place];  Service: Vascular;  Laterality: Left;  . INNER EAR SURGERY Right    puntured eardrum- repaired 2x's   . NASAL SINUS SURGERY     deviated septum  . PATCH ANGIOPLASTY Left 03/27/2017   Procedure: PATCH ANGIOPLASTY LEFT CAROTID ARTERY USING Rueben Bash BIOLOGIC PATCH;  Surgeon: [name], MD;  Location: [place];  Service: Vascular;  Laterality: Left;  . TONSILLECTOMY    . TOTAL KNEE ARTHROPLASTY Right 10/23/2016   Procedure: RIGHT TOTAL KNEE ARTHROPLASTY;  Surgeon: [name], MD;  Location: [place];  Service: Orthopedics;  Laterality: Right;    ROS- all systems are reviewed and negatives except as per HPI above  Current Outpatient Medications  Medication Sig Dispense Refill  . amiodarone (PACERONE) 200 MG tablet Take 0.5 tablets (100 mg total) by mouth every other day. 45 tablet 1  . amLODipine (NORVASC) 5 MG tablet TAKE 1 TABLET TWICE AT 8 AM AND 8 PM. 180 tablet 0  . Calcium Carbonate-Vitamin D (CALCIUM 600+D PO) Take 1 tablet by mouth daily.    Marland Kitchen ELIQUIS 5 MG TABS tablet TAKE (1) TABLET TWICE DAILY. 60 tablet 6  . estradiol (ESTRACE) 0.5 MG tablet Take 0.5 mg by mouth daily.     . fluticasone (FLONASE) 50 MCG/ACT nasal spray Place 2 sprays into both nostrils daily as needed for allergies.     . furosemide (LASIX) 20 MG tablet Take 1 tablet (20 mg total) by mouth as needed for edema (leg swelling / shortness of breath).    . Hypromellose (ARTIFICIAL TEARS OP) Place 2 drops into both eyes 2 (two) times daily as needed (for dry eyes).    Marland Kitchen losartan (COZAAR) 50 MG tablet TAKE 1 TABLET TWICE DAILY 10 AM AND 10 PM 60 tablet 5  . magnesium hydroxide (MILK OF MAGNESIA) 400 MG/5ML suspension Take 30 mLs by mouth daily as needed for mild constipation.    . metoprolol succinate (TOPROL-XL) 50  MG 24 hr tablet TAKE 1 TABLET AT LUNCH OR IMMEDIATELY FOLLOWING A MEAL. 30 tablet 6  . pantoprazole (PROTONIX) 40 MG tablet TAKE (1) TABLET TWICE DAILY. 60 tablet 6  . potassium chloride SA (K-DUR,KLOR-CON) 20 MEQ tablet Take 1 tablet (20 mEq total) by mouth as needed (take on days you take your Furosemide).    . vitamin B-12 (CYANOCOBALAMIN) 1000 MCG tablet Take 1,000 mcg by mouth daily.     No current facility-administered medications for this visit.    Physical Exam: Vitals:   07/30/19 0916  BP: (!) 145/74  Pulse: 68  SpO2: 98%  Weight: 150 lb 3.2 oz (68.1 kg)  Height: 5\' 7"  (1.702 m)    GEN- The patient  is well appearing, alert and oriented x 3 today.   Head- normocephalic, atraumatic Eyes-  Sclera clear, conjunctiva pink Ears- hearing intact Oropharynx- clear Lungs-   normal work of breathing Heart- Regular rate and rhythm  GI- soft, NT, ND, + BS Extremities- no clubbing, cyanosis, or edema  Wt Readings from Last 3 Encounters:  07/30/19 150 lb 3.2 oz (68.1 kg)  05/31/19 144 lb (65.3 kg)  02/09/19 149 lb 3.2 oz (67.7 kg)    EKG tracing ordered today is personally reviewed and shows sinus rhythm with LBBB  Assessment and Plan:  1. Hypertensive cardiovascular disease I reviewed her BP journal.  Mostly controlled but occasionally elevated She will increase her norvasc to 10mg  when elevated Otherwise, no changes  2. Persistent afib Maintaining sinus with 100mg  amiodarone QOD The importance of regular monitoring for amiodarone toxicity were discussed today.  Labs 01/2019 are reviewed chads2vasc sore is 4.  She is on eliquis.  No signs/ symptoms of bleeding  3. High grade L ICA stenosis On eliquis for afib Continue to follow  4. Chronic diastolic dysfunction Stable No change required today  Return to see me in 6 months  [name] MD, [facility] 07/30/2019 10:09 AM

## 2019-08-16 DIAGNOSIS — L910 Hypertrophic scar: Secondary | ICD-10-CM | POA: Diagnosis not present

## 2019-08-16 DIAGNOSIS — Z85828 Personal history of other malignant neoplasm of skin: Secondary | ICD-10-CM | POA: Diagnosis not present

## 2019-08-28 ENCOUNTER — Other Ambulatory Visit (HOSPITAL_COMMUNITY): Payer: Self-pay | Admitting: Nurse Practitioner

## 2019-09-09 ENCOUNTER — Telehealth: Payer: Self-pay | Admitting: Physical Medicine and Rehabilitation

## 2019-09-09 NOTE — Telephone Encounter (Signed)
Right L4-5 interlam esi, if that does not help would look at MRI lspine, alternatively could look at short course of PT

## 2019-09-10 NOTE — Telephone Encounter (Signed)
Patient is taking Eliquis- [facility]. Can you work on getting OK to hold.

## 2019-09-13 ENCOUNTER — Telehealth: Payer: Self-pay | Admitting: *Deleted

## 2019-09-13 NOTE — Telephone Encounter (Signed)
Sent message to Dr. Bronson Ing to hold eliquis.

## 2019-09-13 NOTE — Telephone Encounter (Signed)
Yes, okay to hold.

## 2019-09-13 NOTE — Telephone Encounter (Signed)
Per [employer], pt files medicaid as primary insurance, no pa is needed.  Pt is scheduled 09/30/19 with driver. Pt will hold BT 2 days prior to 09/30/19     Herminio Commons, MD  Physician  Specialty:  Cardiology  Telephone Encounter      Signed  Encounter Date:  09/13/2019          Signed         Show:Clear all [x] Manual[] Template[] Copied  Added by: [x] Herminio Commons, MD  [] Hover for details Yes, okay to hold.

## 2019-09-16 DIAGNOSIS — Z23 Encounter for immunization: Secondary | ICD-10-CM | POA: Diagnosis not present

## 2019-09-20 DIAGNOSIS — H524 Presbyopia: Secondary | ICD-10-CM | POA: Diagnosis not present

## 2019-09-20 DIAGNOSIS — H52203 Unspecified astigmatism, bilateral: Secondary | ICD-10-CM | POA: Diagnosis not present

## 2019-09-20 DIAGNOSIS — H2513 Age-related nuclear cataract, bilateral: Secondary | ICD-10-CM | POA: Diagnosis not present

## 2019-09-20 DIAGNOSIS — H5203 Hypermetropia, bilateral: Secondary | ICD-10-CM | POA: Diagnosis not present

## 2019-09-27 ENCOUNTER — Other Ambulatory Visit: Payer: Self-pay | Admitting: Adult Health

## 2019-09-28 ENCOUNTER — Other Ambulatory Visit: Payer: Self-pay | Admitting: *Deleted

## 2019-09-29 ENCOUNTER — Telehealth (INDEPENDENT_AMBULATORY_CARE_PROVIDER_SITE_OTHER): Payer: Medicare Other | Admitting: Cardiovascular Disease

## 2019-09-29 ENCOUNTER — Encounter: Payer: Self-pay | Admitting: Cardiovascular Disease

## 2019-09-29 VITALS — BP 144/66 | HR 57 | Ht 67.0 in | Wt 147.0 lb

## 2019-09-29 DIAGNOSIS — I10 Essential (primary) hypertension: Secondary | ICD-10-CM

## 2019-09-29 DIAGNOSIS — Z9889 Other specified postprocedural states: Secondary | ICD-10-CM

## 2019-09-29 DIAGNOSIS — R0602 Shortness of breath: Secondary | ICD-10-CM | POA: Diagnosis not present

## 2019-09-29 DIAGNOSIS — I4819 Other persistent atrial fibrillation: Secondary | ICD-10-CM | POA: Diagnosis not present

## 2019-09-29 DIAGNOSIS — I2510 Atherosclerotic heart disease of native coronary artery without angina pectoris: Secondary | ICD-10-CM | POA: Diagnosis not present

## 2019-09-29 DIAGNOSIS — R600 Localized edema: Secondary | ICD-10-CM

## 2019-09-29 DIAGNOSIS — Z79899 Other long term (current) drug therapy: Secondary | ICD-10-CM

## 2019-09-29 NOTE — Progress Notes (Signed)
Virtual Visit via Telephone Note   This visit type was conducted due to national recommendations for restrictions regarding the COVID-19 Pandemic (e.g. social distancing) in an effort to limit this patient's exposure and mitigate transmission in our community.  Due to her co-morbid illnesses, this patient is at least at moderate risk for complications without adequate follow up.  This format is felt to be most appropriate for this patient at this time.  The patient did not have access to video technology/had technical difficulties with video requiring transitioning to audio format only (telephone).  All issues noted in this document were discussed and addressed.  No physical exam could be performed with this format.  Please refer to the patient's chart for her  consent to telehealth for [facility].   The patient was identified using 2 identifiers.  Date:  09/29/2019   ID:  [name], DOB [birth year], MRN [id number]  Patient Location: Home Provider Location: Office  PCP:  Glenda Chroman, MD  Cardiologist:  Kate Sable, MD  Electrophysiologist:  [name], MD   Evaluation Performed:  Follow-Up Visit  Chief Complaint: Persistent atrial fibrillation, hypertension  History of Present Illness:    Lydia Graham is a 84 y.o. female with persistent atrial fibrillation, hypertension, and chronic bilateral leg edema.  She wears compression stockings.  She has been doing very well overall.  She has seldom had to take Lasix.  Her blood pressure did become low after the first dose of the COVID-19 vaccine but this has since resolved.  We reviewed her blood pressure log with several systolic readings in the A999333 range.  She denies chest pain and palpitations.   Past Medical History:  Diagnosis Date  . Arthritis    oa, all over - multiple areas   . Carotid artery occlusion   . Dysrhythmia    AFib   . GERD (gastroesophageal reflux disease)   . Headache    h/o  migraines   . Hypertension    Past Surgical History:  Procedure Laterality Date  . ABDOMINAL HYSTERECTOMY    . APPENDECTOMY    . BACK SURGERY    . CARDIOVERSION N/A 12/19/2017   Procedure: CARDIOVERSION;  Surgeon: Arnoldo Lenis, MD;  [facility];  Service: Endoscopy;  Laterality: N/A;  . CARDIOVERSION N/A 12/26/2017   Procedure: CARDIOVERSION;  Surgeon: [name], MD;  Location: [facility] ENDOSCOPY;  Service: Cardiovascular;  Laterality: N/A;  . CARDIOVERSION N/A 01/26/2018   Procedure: CARDIOVERSION;  Surgeon: Jerline Pain, MD;  Location: [facility] ENDOSCOPY;  Service: Cardiovascular;  Laterality: N/A;  . CAROTID ENDARTERECTOMY    . ENDARTERECTOMY Left 03/27/2017   Procedure: ENDARTERECTOMY CAROTID LEFT;  Surgeon: [name], MD;  Location: [place];  Service: Vascular;  Laterality: Left;  . INNER EAR SURGERY Right    puntured eardrum- repaired 2x's   . NASAL SINUS SURGERY     deviated septum  . PATCH ANGIOPLASTY Left 03/27/2017   Procedure: PATCH ANGIOPLASTY LEFT CAROTID ARTERY USING Rueben Bash BIOLOGIC PATCH;  Surgeon: [name], MD;  Location: [place];  Service: Vascular;  Laterality: Left;  . TONSILLECTOMY    . TOTAL KNEE ARTHROPLASTY Right 10/23/2016   Procedure: RIGHT TOTAL KNEE ARTHROPLASTY;  Surgeon: [name], MD;  Location: [place];  Service: Orthopedics;  Laterality: Right;     Current Meds  Medication Sig  . amiodarone (PACERONE) 200 MG tablet Take 0.5 tablets (100 mg total) by mouth every other day.  Marland Kitchen  amLODipine (NORVASC) 5 MG tablet TAKE 1 TABLET TWICE AT 8 AM AND 8 PM.  . Calcium Carbonate-Vitamin D (CALCIUM 600+D PO) Take 1 tablet by mouth daily.  Marland Kitchen ELIQUIS 5 MG TABS tablet TAKE (1) TABLET TWICE DAILY.  Marland Kitchen estradiol (ESTRACE) 0.5 MG tablet Take 0.5 mg by mouth daily.   . fluticasone (FLONASE) 50 MCG/ACT nasal spray Place 2 sprays into both nostrils daily as needed for allergies.   . furosemide (LASIX) 20 MG tablet Take 1 tablet (20 mg  total) by mouth as needed for edema (leg swelling / shortness of breath).  . Hypromellose (ARTIFICIAL TEARS OP) Place 2 drops into both eyes 2 (two) times daily as needed (for dry eyes).  Marland Kitchen losartan (COZAAR) 50 MG tablet TAKE 1 TABLET AT 10 AM AND 1 TABLET AT 10 PM.  . magnesium hydroxide (MILK OF MAGNESIA) 400 MG/5ML suspension Take 30 mLs by mouth daily as needed for mild constipation.  . metoprolol succinate (TOPROL-XL) 50 MG 24 hr tablet TAKE 1 TABLET AT LUNCH OR IMMEDIATELY FOLLOWING A MEAL.  . pantoprazole (PROTONIX) 40 MG tablet TAKE (1) TABLET TWICE DAILY.  Marland Kitchen potassium chloride SA (K-DUR,KLOR-CON) 20 MEQ tablet Take 1 tablet (20 mEq total) by mouth as needed (take on days you take your Furosemide).  . vitamin B-12 (CYANOCOBALAMIN) 1000 MCG tablet Take 1,000 mcg by mouth daily.     Allergies:   Rosuvastatin, Lisinopril, Other, and Singulair [montelukast sodium]   Social History   Tobacco Use  . Smoking status: Never Smoker  . Smokeless tobacco: Never Used  Substance Use Topics  . Alcohol use: No  . Drug use: No     Family Hx: The patient's family history includes Stroke in her mother.  ROS:   Please see the history of present illness.     All other systems reviewed and are negative.   Prior CV studies:   The following studies were reviewed today:  NA  Labs/Other Tests and Data Reviewed:    EKG:  No ECG reviewed.  Recent Labs: No results found for requested labs within last 8760 hours.   Recent Lipid Panel No results found for: CHOL, TRIG, HDL, CHOLHDL, LDLCALC, LDLDIRECT  Wt Readings from Last 3 Encounters:  09/29/19 147 lb (66.7 kg)  07/30/19 150 lb 3.2 oz (68.1 kg)  05/31/19 144 lb (65.3 kg)     Objective:    Vital Signs:  BP (!) 144/66   Pulse (!) 57   Ht 5\' 7"  (1.702 m)   Wt 147 lb (66.7 kg)   LMP  (LMP Unknown)   BMI 23.02 kg/m    VITAL SIGNS:  reviewed  ASSESSMENT & PLAN:    1. Persistent atrial fibrillation: She takes amiodarone 100 mg  every other day as recommended by [name]. Anticoagulated with Eliquis. She denies palpitations.Consider ablation if she has recurrence.   Normal liver and thyroid function in August 2020.  2. Hypertension: Blood pressure is  mildly elevated.  We reviewed her blood pressure log today with several systolic readings in the A999333 range. No changes to therapy today.  3.High-grade left internal carotid artery stenosis status post carotid endarterectomy in October 2018: Stable.She is on Eliquis for atrial fibrillation.Carotid Dopplers on 05/31/2019 showed no significant stenosis.  Followed by vascular surgery.  4. Bilateral leg edema/shortness of breath: Euvolemic.  She takes Lasix and potassium as needed.She is wearing compression stockings.    COVID-19 Education: The signs and symptoms of COVID-19 were discussed with the patient and  how to seek care for testing (follow up with PCP or arrange E-visit).  The importance of social distancing was discussed today.  Time:   Today, I have spent 20 minutes with the patient with telehealth technology discussing the above problems.     Medication Adjustments/Labs and Tests Ordered: Current medicines are reviewed at length with the patient today.  Concerns regarding medicines are outlined above.   Tests Ordered: No orders of the defined types were placed in this encounter.   Medication Changes: No orders of the defined types were placed in this encounter.   Follow Up:  In Person in 1 year(s)  Signed, Kate Sable, MD  09/29/2019 9:30 AM    [place]

## 2019-09-29 NOTE — Patient Instructions (Signed)
Medication Instructions:  Your physician recommends that you continue on your current medications as directed. Please refer to the Current Medication list given to you today.  *If you need a refill on your cardiac medications before your next appointment, please call your pharmacy*   Lab Work: None today If you have labs (blood work) drawn today and your tests are completely normal, you will receive your results only by: . MyChart Message (if you have MyChart) OR . A paper copy in the mail If you have any lab test that is abnormal or we need to change your treatment, we will call you to review the results.   Testing/Procedures: None today   Follow-Up: At [facility], you and your health needs are our priority.  As part of our continuing mission to provide you with exceptional heart care, we have created designated Provider Care Teams.  These Care Teams include your primary Cardiologist (physician) and Advanced Practice Providers (APPs -  Physician Assistants and Nurse Practitioners) who all work together to provide you with the care you need, when you need it.  We recommend signing up for the patient portal called "MyChart".  Sign up information is provided on this After Visit Summary.  MyChart is used to connect with patients for Virtual Visits (Telemedicine).  Patients are able to view lab/test results, encounter notes, upcoming appointments, etc.  Non-urgent messages can be sent to your provider as well.   To learn more about what you can do with MyChart, go to [email or web address].    Your next appointment:   12 month(s)  The format for your next appointment:   In Person  Provider:   Suresh Koneswaran, MD   Other Instructions None      Thank you for choosing [facility] !         

## 2019-09-30 ENCOUNTER — Encounter: Payer: Self-pay | Admitting: Physical Medicine and Rehabilitation

## 2019-09-30 ENCOUNTER — Ambulatory Visit: Payer: Self-pay

## 2019-09-30 ENCOUNTER — Other Ambulatory Visit (HOSPITAL_COMMUNITY): Payer: Self-pay | Admitting: Nurse Practitioner

## 2019-09-30 ENCOUNTER — Other Ambulatory Visit: Payer: Self-pay

## 2019-09-30 ENCOUNTER — Ambulatory Visit (INDEPENDENT_AMBULATORY_CARE_PROVIDER_SITE_OTHER): Payer: Medicare Other | Admitting: Physical Medicine and Rehabilitation

## 2019-09-30 VITALS — BP 137/69 | HR 61

## 2019-09-30 DIAGNOSIS — M5416 Radiculopathy, lumbar region: Secondary | ICD-10-CM | POA: Diagnosis not present

## 2019-09-30 MED ORDER — METHYLPREDNISOLONE ACETATE 80 MG/ML IJ SUSP
40.0000 mg | Freq: Once | INTRAMUSCULAR | Status: AC
  Administered 2019-09-30: 40 mg

## 2019-09-30 NOTE — Progress Notes (Signed)
 .  Numeric Pain Rating Scale and Functional Assessment Average Pain 5   In the last MONTH (on 0-10 scale) has pain interfered with the following?  1. General activity like being  able to carry out your everyday physical activities such as walking, climbing stairs, carrying groceries, or moving a chair?  Rating(8)   +Driver, +BT(eliquis, stopped 09/27/19) , -Dye Allergies.

## 2019-10-04 NOTE — Progress Notes (Signed)
Lydia Graham - 84 y.o. female MRN [id number]  Date of birth: 1935-07-07  Office Visit Note: Visit Date: 09/30/2019 PCP: Lydia Chroman, MD Referred by: Lydia Chroman, MD  Subjective: Chief Complaint  Patient presents with  . Left Leg - Pain   HPI: [name] is a 84 y.o. female who comes in today For planned right L4-5 interlaminar epidural steroid injection.  I did see her last December and completed greater trochanteric injection on the right with some relief of acute symptoms.  She has had this right low back and right lateral hip and thigh pain for many many years and she has had some success with intermittent epidural injection and intermittent greater trochanteric injection.  She has MRI from 2015 showing scoliosis she has x-ray showing scoliosis centered in the upper mid lumbar region.  She has more findings on the right than the left.  Today in point of fact she is having left-sided complaints.  It is posterior lateral in a similar area that she has typically had her pain but on the opposite side.  No specific trauma no falls no weakness.  At this point I still think it is fine to complete the epidural injection but we will by sit to the left side and not the right.  If she does not get much relief I would look at MRI of the lumbar spine.  She has been off her anticoagulation in anticipation of the injection.  She also is scheduled to have her second vaccine injection and we did talk about the risk and benefits of using a very small amount of steroid and the epidural injection.  She did want to proceed with the injection.  ROS Otherwise per HPI.  Assessment & Plan: Visit Diagnoses:  1. Lumbar radiculopathy     Plan: No additional findings.   Meds & Orders:  Meds ordered this encounter  Medications  . methylPREDNISolone acetate (DEPO-MEDROL) injection 40 mg    Orders Placed This Encounter  Procedures  . XR C-ARM NO REPORT  . Epidural Steroid injection      Follow-up: Return if symptoms worsen or fail to improve, for Consider updated MRI.   Procedures: No procedures performed  Lumbar Epidural Steroid Injection - Interlaminar Approach with Fluoroscopic Guidance  Patient: Lydia Graham      Date of Birth: [birth year] MRN: [id number] PCP: Lydia Chroman, MD      Visit Date: 09/30/2019   Universal Protocol:     Consent Given By: the patient  Position: PRONE  Additional Comments: Vital signs were monitored before and after the procedure. Patient was prepped and draped in the usual sterile fashion. The correct patient, procedure, and site was verified.   Injection Procedure Details:  Procedure Site One Meds Administered:  Meds ordered this encounter  Medications  . methylPREDNISolone acetate (DEPO-MEDROL) injection 40 mg     Laterality: Left  Location/Site:  L4-L5  Needle size: 20 G  Needle type: Tuohy  Needle Placement: Paramedian epidural  Findings:   -Comments: Excellent flow of contrast into the epidural space.  Procedure Details: Using a paramedian approach from the side mentioned above, the region overlying the inferior lamina was localized under fluoroscopic visualization and the soft tissues overlying this structure were infiltrated with 4 ml. of 1% Lidocaine without Epinephrine. The Tuohy needle was inserted into the epidural space using a paramedian approach.   The epidural space was localized using loss of resistance along with lateral and bi-planar fluoroscopic  views.  After negative aspirate for air, blood, and CSF, a 2 ml. volume of Isovue-250 was injected into the epidural space and the flow of contrast was observed. Radiographs were obtained for documentation purposes.    The injectate was administered into the level noted above.   Additional Comments:  The patient tolerated the procedure well Dressing: 2 x 2 sterile gauze and Band-Aid    Post-procedure details: Patient was observed during the  procedure. Post-procedure instructions were reviewed.  Patient left the clinic in stable condition.    Clinical History: FINDINGS: There is 13 degrees of levoconvex lumbar scoliosis between L5 and L2. Lipoma of the a left lateral abdominal wall musculature incidentally noted.  The lowest lumbar type non-rib-bearing vertebra is labeled as L5. The conus medullaris appears normal. Conus level: L1-2. Suspected postoperative findings on the right at L3-4.  There is disc desiccation throughout the lumbar spine. Type 1 degenerative endplate findings at X33443 with type 2 degenerative endplate findings at 075-GRM.  There is 3 mm of grade 1 anterolisthesis at L3-4 and 3 mm retrolisthesis at L4-5.  The leftward scoliosis has a significant rotary component.  Small central disc protrusion at T12-L1 without impingement. Additional findings at individual levels are as follows:  L1-2: No impingement. Left lateral recess and inferior foraminal disc protrusion.  L2-3: Mild displacement of the left L2 nerve in the lateral extraforaminal space due to left lateral extraforaminal disc protrusion.  L3-4: Moderate right foraminal stenosis and mild right subarticular lateral recess stenosis due to disc uncovering, right lateral recess and foraminal disc protrusion, and facet arthropathy. There is also a left paracentral disc protrusion.  L4-5: Mild left foraminal stenosis along with mild left and borderline right subarticular lateral recess stenosis due to disc bulge and facet and intervertebral spurring.  L5-S1: No impingement. Small bilateral synovial cysts are best observed on the axial images.  IMPRESSION: 1. Lumbar spondylosis and degenerative disc disease, causing moderate impingement at L3-4 and mild impingement at L2-3 and L4-5, as detailed above. 2. Levoconvex lumbar scoliosis with rotary component.   Electronically Signed   By: Sherryl Barters M.D.   On: 11/02/2013  11:29   She reports that she has never smoked. She has never used smokeless tobacco. No results for input(s): HGBA1C, LABURIC in the last 8760 hours.  Objective:  VS:  HT:    WT:   BMI:     BP:137/69  HR:61bpm  TEMP: ( )  RESP:  Physical Exam  Ortho Exam Imaging: No results found.  Past Medical/Family/Surgical/Social History: Medications & Allergies reviewed per EMR, new medications updated. Patient Active Problem List   Diagnosis Date Noted  . PAF (paroxysmal atrial fibrillation) ([place])   . Chronic cough 12/29/2017  . Atrial fibrillation with rapid ventricular response ([place]) 12/23/2017  . Acute on chronic combined systolic and diastolic CHF (congestive heart failure) ([place])   . Menopausal symptom 10/17/2017  . Menopausal syndrome 10/17/2017  . Carotid stenosis 03/27/2017  . S/P total knee arthroplasty, right 10/23/2016  . Unilateral primary osteoarthritis, right knee 07/13/2016   Past Medical History:  Diagnosis Date  . Arthritis    oa, all over - multiple areas   . Carotid artery occlusion   . Dysrhythmia    AFib   . GERD (gastroesophageal reflux disease)   . Headache    h/o migraines   . Hypertension    Family History  Problem Relation Age of Onset  . Stroke Mother    Past Surgical History:  Procedure  Laterality Date  . ABDOMINAL HYSTERECTOMY    . APPENDECTOMY    . BACK SURGERY    . CARDIOVERSION N/A 12/19/2017   Procedure: CARDIOVERSION;  Surgeon: Arnoldo Lenis, MD;  [facility];  Service: Endoscopy;  Laterality: N/A;  . CARDIOVERSION N/A 12/26/2017   Procedure: CARDIOVERSION;  Surgeon: [name], MD;  Location: [facility] ENDOSCOPY;  Service: Cardiovascular;  Laterality: N/A;  . CARDIOVERSION N/A 01/26/2018   Procedure: CARDIOVERSION;  Surgeon: Jerline Pain, MD;  Location: [facility] ENDOSCOPY;  Service: Cardiovascular;  Laterality: N/A;  . CAROTID ENDARTERECTOMY    . ENDARTERECTOMY Left 03/27/2017   Procedure: ENDARTERECTOMY CAROTID LEFT;  Surgeon:  [name], MD;  Location: [place];  Service: Vascular;  Laterality: Left;  . INNER EAR SURGERY Right    puntured eardrum- repaired 2x's   . NASAL SINUS SURGERY     deviated septum  . PATCH ANGIOPLASTY Left 03/27/2017   Procedure: PATCH ANGIOPLASTY LEFT CAROTID ARTERY USING Rueben Bash BIOLOGIC PATCH;  Surgeon: [name], MD;  Location: [place];  Service: Vascular;  Laterality: Left;  . TONSILLECTOMY    . TOTAL KNEE ARTHROPLASTY Right 10/23/2016   Procedure: RIGHT TOTAL KNEE ARTHROPLASTY;  Surgeon: [name], MD;  Location: [place];  Service: Orthopedics;  Laterality: Right;   Social History   Occupational History  . Not on file  Tobacco Use  . Smoking status: Never Smoker  . Smokeless tobacco: Never Used  Substance and Sexual Activity  . Alcohol use: No  . Drug use: No  . Sexual activity: Not Currently    Birth control/protection: Surgical

## 2019-10-04 NOTE — Procedures (Signed)
Lumbar Epidural Steroid Injection - Interlaminar Approach with Fluoroscopic Guidance  Patient: Lydia Graham      Date of Birth: [birth year] MRN: [id number] PCP: Glenda Chroman, MD      Visit Date: 09/30/2019   Universal Protocol:     Consent Given By: the patient  Position: PRONE  Additional Comments: Vital signs were monitored before and after the procedure. Patient was prepped and draped in the usual sterile fashion. The correct patient, procedure, and site was verified.   Injection Procedure Details:  Procedure Site One Meds Administered:  Meds ordered this encounter  Medications  . methylPREDNISolone acetate (DEPO-MEDROL) injection 40 mg     Laterality: Left  Location/Site:  L4-L5  Needle size: 20 G  Needle type: Tuohy  Needle Placement: Paramedian epidural  Findings:   -Comments: Excellent flow of contrast into the epidural space.  Procedure Details: Using a paramedian approach from the side mentioned above, the region overlying the inferior lamina was localized under fluoroscopic visualization and the soft tissues overlying this structure were infiltrated with 4 ml. of 1% Lidocaine without Epinephrine. The Tuohy needle was inserted into the epidural space using a paramedian approach.   The epidural space was localized using loss of resistance along with lateral and bi-planar fluoroscopic views.  After negative aspirate for air, blood, and CSF, a 2 ml. volume of Isovue-250 was injected into the epidural space and the flow of contrast was observed. Radiographs were obtained for documentation purposes.    The injectate was administered into the level noted above.   Additional Comments:  The patient tolerated the procedure well Dressing: 2 x 2 sterile gauze and Band-Aid    Post-procedure details: Patient was observed during the procedure. Post-procedure instructions were reviewed.  Patient left the clinic in stable condition.

## 2019-10-09 DIAGNOSIS — Z23 Encounter for immunization: Secondary | ICD-10-CM | POA: Diagnosis not present

## 2019-11-20 ENCOUNTER — Other Ambulatory Visit: Payer: Self-pay | Admitting: Adult Health

## 2019-11-23 ENCOUNTER — Other Ambulatory Visit: Payer: Self-pay | Admitting: Cardiovascular Disease

## 2019-11-23 ENCOUNTER — Other Ambulatory Visit: Payer: Self-pay | Admitting: *Deleted

## 2019-11-23 MED ORDER — FUROSEMIDE 20 MG PO TABS: 20.0000 mg | ORAL_TABLET | Freq: Every day | ORAL | 6 refills | Status: DC | PRN

## 2019-11-24 DIAGNOSIS — K219 Gastro-esophageal reflux disease without esophagitis: Secondary | ICD-10-CM | POA: Diagnosis not present

## 2019-11-24 DIAGNOSIS — J309 Allergic rhinitis, unspecified: Secondary | ICD-10-CM | POA: Diagnosis not present

## 2019-11-24 DIAGNOSIS — Z299 Encounter for prophylactic measures, unspecified: Secondary | ICD-10-CM | POA: Diagnosis not present

## 2019-11-24 DIAGNOSIS — I10 Essential (primary) hypertension: Secondary | ICD-10-CM | POA: Diagnosis not present

## 2019-11-24 DIAGNOSIS — B078 Other viral warts: Secondary | ICD-10-CM | POA: Diagnosis not present

## 2019-11-24 DIAGNOSIS — I8390 Asymptomatic varicose veins of unspecified lower extremity: Secondary | ICD-10-CM | POA: Diagnosis not present

## 2019-11-24 DIAGNOSIS — I4891 Unspecified atrial fibrillation: Secondary | ICD-10-CM | POA: Diagnosis not present

## 2020-01-14 DIAGNOSIS — M19049 Primary osteoarthritis, unspecified hand: Secondary | ICD-10-CM | POA: Diagnosis not present

## 2020-01-14 DIAGNOSIS — D6869 Other thrombophilia: Secondary | ICD-10-CM | POA: Diagnosis not present

## 2020-01-14 DIAGNOSIS — N1832 Chronic kidney disease, stage 3b: Secondary | ICD-10-CM | POA: Diagnosis not present

## 2020-01-14 DIAGNOSIS — I4891 Unspecified atrial fibrillation: Secondary | ICD-10-CM | POA: Diagnosis not present

## 2020-01-14 DIAGNOSIS — Z299 Encounter for prophylactic measures, unspecified: Secondary | ICD-10-CM | POA: Diagnosis not present

## 2020-01-14 DIAGNOSIS — I10 Essential (primary) hypertension: Secondary | ICD-10-CM | POA: Diagnosis not present

## 2020-01-28 ENCOUNTER — Encounter: Payer: Self-pay | Admitting: Internal Medicine

## 2020-01-28 ENCOUNTER — Ambulatory Visit (INDEPENDENT_AMBULATORY_CARE_PROVIDER_SITE_OTHER): Payer: Medicare Other | Admitting: Internal Medicine

## 2020-01-28 VITALS — BP 150/72 | HR 67 | Ht 67.0 in | Wt 153.0 lb

## 2020-01-28 DIAGNOSIS — I10 Essential (primary) hypertension: Secondary | ICD-10-CM | POA: Diagnosis not present

## 2020-01-28 DIAGNOSIS — Z79899 Other long term (current) drug therapy: Secondary | ICD-10-CM | POA: Diagnosis not present

## 2020-01-28 DIAGNOSIS — D6869 Other thrombophilia: Secondary | ICD-10-CM | POA: Diagnosis not present

## 2020-01-28 DIAGNOSIS — I4819 Other persistent atrial fibrillation: Secondary | ICD-10-CM | POA: Diagnosis not present

## 2020-01-28 NOTE — Progress Notes (Signed)
PCP: Glenda Chroman, MD Primary Cardiologist: previously Dr Bronson Ing Primary EP: Dr Rayann Heman  Lydia Graham is a 84 y.o. female who presents today for routine electrophysiology followup.  Since last being seen in our clinic, the patient reports doing very well.  Today, she denies symptoms of palpitations, chest pain, shortness of breath,  lower extremity edema, dizziness, presyncope, or syncope.  The patient is otherwise without complaint today.   Past Medical History:  Diagnosis Date  . Arthritis    oa, all over - multiple areas   . Carotid artery occlusion   . Dysrhythmia    AFib   . GERD (gastroesophageal reflux disease)   . Headache    h/o migraines   . Hypertension    Past Surgical History:  Procedure Laterality Date  . ABDOMINAL HYSTERECTOMY    . APPENDECTOMY    . BACK SURGERY    . CARDIOVERSION N/A 12/19/2017   Procedure: CARDIOVERSION;  Surgeon: Arnoldo Lenis, MD;  [facility];  Service: Endoscopy;  Laterality: N/A;  . CARDIOVERSION N/A 12/26/2017   Procedure: CARDIOVERSION;  Surgeon: [name], MD;  Location: [facility] ENDOSCOPY;  Service: Cardiovascular;  Laterality: N/A;  . CARDIOVERSION N/A 01/26/2018   Procedure: CARDIOVERSION;  Surgeon: Jerline Pain, MD;  Location: [facility] ENDOSCOPY;  Service: Cardiovascular;  Laterality: N/A;  . CAROTID ENDARTERECTOMY    . ENDARTERECTOMY Left 03/27/2017   Procedure: ENDARTERECTOMY CAROTID LEFT;  Surgeon: [name], MD;  Location: [place];  Service: Vascular;  Laterality: Left;  . INNER EAR SURGERY Right    puntured eardrum- repaired 2x's   . NASAL SINUS SURGERY     deviated septum  . PATCH ANGIOPLASTY Left 03/27/2017   Procedure: PATCH ANGIOPLASTY LEFT CAROTID ARTERY USING Rueben Bash BIOLOGIC PATCH;  Surgeon: [name], MD;  Location: [place];  Service: Vascular;  Laterality: Left;  . TONSILLECTOMY    . TOTAL KNEE ARTHROPLASTY Right 10/23/2016   Procedure: RIGHT TOTAL KNEE ARTHROPLASTY;   Surgeon: [name], MD;  Location: [place];  Service: Orthopedics;  Laterality: Right;    ROS- all systems are reviewed and negatives except as per HPI above  Current Outpatient Medications  Medication Sig Dispense Refill  . amiodarone (PACERONE) 200 MG tablet Take 0.5 tablets (100 mg total) by mouth every other day. 45 tablet 1  . amLODipine (NORVASC) 5 MG tablet TAKE 1 TABLET TWICE AT 8 AM AND 8 PM. 180 tablet 0  . Calcium Carbonate-Vitamin D (CALCIUM 600+D PO) Take 1 tablet by mouth daily.    Marland Kitchen ELIQUIS 5 MG TABS tablet TAKE (1) TABLET TWICE DAILY. 60 tablet 6  . estradiol (ESTRACE) 0.5 MG tablet Take 0.5 mg by mouth daily.     . fluticasone (FLONASE) 50 MCG/ACT nasal spray Place 2 sprays into both nostrils daily as needed for allergies.     . furosemide (LASIX) 20 MG tablet Take 1 tablet (20 mg total) by mouth daily as needed for edema (leg swelling / shortness of breath). 30 tablet 6  . Hypromellose (ARTIFICIAL TEARS OP) Place 2 drops into both eyes 2 (two) times daily as needed (for dry eyes).    Marland Kitchen losartan (COZAAR) 50 MG tablet TAKE 1 TABLET AT 10 AM AND 1 TABLET AT 10 PM. 60 tablet 6  . magnesium hydroxide (MILK OF MAGNESIA) 400 MG/5ML suspension Take 30 mLs by mouth daily as needed for mild constipation.    . meloxicam (MOBIC) 7.5 MG tablet Take 7.5 mg by  mouth daily.    . metoprolol succinate (TOPROL-XL) 50 MG 24 hr tablet TAKE 1 TABLET AT LUNCH OR IMMEDIATELY FOLLOWING A MEAL. 90 tablet 3  . pantoprazole (PROTONIX) 40 MG tablet TAKE (1) TABLET TWICE DAILY. 60 tablet 6  . potassium chloride SA (K-DUR,KLOR-CON) 20 MEQ tablet Take 1 tablet (20 mEq total) by mouth as needed (take on days you take your Furosemide).    . vitamin B-12 (CYANOCOBALAMIN) 1000 MCG tablet Take 1,000 mcg by mouth daily.     No current facility-administered medications for this visit.    Physical Exam: Vitals:   01/28/20 0925  BP: (!) 150/72  Pulse: 67  SpO2: 99%  Weight: 153 lb (69.4 kg)  Height: 5'  7" (1.702 m)    GEN- The patient is well appearing, alert and oriented x 3 today.   Head- normocephalic, atraumatic Eyes-  Sclera clear, conjunctiva pink Ears- hearing intact Oropharynx- clear Lungs-  normal work of breathing Heart- Regular rate and rhythm  GI- soft  Extremities- no clubbing, cyanosis, or edema  Wt Readings from Last 3 Encounters:  01/28/20 153 lb (69.4 kg)  09/29/19 147 lb (66.7 kg)  07/30/19 150 lb 3.2 oz (68.1 kg)    EKG tracing ordered today is personally reviewed and shows sinus, IVCD  Assessment and Plan:  1. Persistent afib Maintaining sinus with amiodarone 100mg  QOD The importance of close follow-up to avoid toxicity with this medicine were discussed today.   Obtain lfts, tfts today chads2vasc score is 4.  She is on eliquis  2. Chronic diastolic dysfunction Stable No change required today  3. HTN Stable No change required today  4. L ICA stensosis s/p CEA 10/18 Dopplers 05/31/19 showed no significant stensosis Follows with vascualr surgery No change required today   Risks, benefits and potential toxicities for medications prescribed and/or refilled reviewed with patient today.   Return in 6 months to see me  [name] MD, [facility] 01/28/2020 9:47 AM

## 2020-01-28 NOTE — Patient Instructions (Addendum)
Medication Instructions:  Continue all current medications.  Labwork:  LFT, T4, TSH - orders given today.  Office will contact with results via phone or letter.    Testing/Procedures: none  Follow-Up: 6 months   Any Other Special Instructions Will Be Listed Below (If Applicable).  If you need a refill on your cardiac medications before your next appointment, please call your pharmacy.

## 2020-01-28 NOTE — Addendum Note (Signed)
Addended by: Laurine Blazer on: 01/28/2020 10:10 AM   Modules accepted: Orders

## 2020-02-17 ENCOUNTER — Telehealth: Payer: Self-pay | Admitting: *Deleted

## 2020-02-17 NOTE — Telephone Encounter (Signed)
Patient last seen by [name] - LFT, TSH, & T4 ordered - please see result in "care everywhere".

## 2020-02-21 ENCOUNTER — Encounter: Payer: Self-pay | Admitting: Orthopedic Surgery

## 2020-02-21 ENCOUNTER — Ambulatory Visit (INDEPENDENT_AMBULATORY_CARE_PROVIDER_SITE_OTHER): Payer: Medicare Other

## 2020-02-21 ENCOUNTER — Ambulatory Visit (INDEPENDENT_AMBULATORY_CARE_PROVIDER_SITE_OTHER): Payer: Medicare Other | Admitting: Orthopedic Surgery

## 2020-02-21 VITALS — Ht 67.0 in | Wt 153.0 lb

## 2020-02-21 DIAGNOSIS — M79641 Pain in right hand: Secondary | ICD-10-CM

## 2020-02-21 DIAGNOSIS — M19041 Primary osteoarthritis, right hand: Secondary | ICD-10-CM | POA: Diagnosis not present

## 2020-02-21 NOTE — Progress Notes (Signed)
Office Visit Note   Patient: Lydia Graham           Date of Birth: June 26, 1935           MRN: [id number] Visit Date: 02/21/2020              Requested by: Glenda Chroman, MD [address] [place],  [place] [address] PCP: Glenda Chroman, MD  Chief Complaint  Patient presents with  . Right Hand - Pain      HPI: Patient is an 84 year old woman who presents complaining of pain is all fingers and her thumb.  She states that she has some snapping of the long finger.   Assessment & Plan: Visit Diagnoses:  1. Pain in right hand   2. Primary osteoarthritis, right hand     Plan: Recommended Voltaren gel recommended paraffin heat baths.  Follow-Up Instructions: Return if symptoms worsen or fail to improve.   Ortho Exam  Patient is alert, oriented, no adenopathy, well-dressed, normal affect, normal respiratory effort. Examination patient has Heberden and Bouchard nodes of the DIP and PIP joints.  She has some mild triggering of the right long finger but this is not significantly symptomatic.  She has pain to palpation over the MCP joint of the thumb as well as the thumb carpometacarpal joint.  The first dorsal extensor compartment is not painful.  Imaging: XR Hand Complete Right  Result Date: 02/21/2020 Three-view radiographs of the right hand shows advanced osteoarthritis of the DIP and PIP joints of the fingers advanced arthritis of the thumb MCP joint as well as carpal metacarpal with subluxation of the MCP joints of the index and long finger  No images are attached to the encounter.  Labs: No results found for: HGBA1C, ESRSEDRATE, CRP, LABURIC, REPTSTATUS, GRAMSTAIN, CULT, LABORGA   Lab Results  Component Value Date   ALBUMIN 3.4 (L) 12/26/2017   ALBUMIN 4.0 03/25/2017    Lab Results  Component Value Date   MG 1.9 12/22/2017   No results found for: VD25OH  No results found for: PREALBUMIN CBC EXTENDED Latest Ref Rng & Units 01/19/2018 12/24/2017 12/23/2017  WBC 4.0 - 10.5  K/uL 8.4 7.9 10.2  RBC 3.87 - 5.11 MIL/uL 5.07 4.73 5.17(H)  HGB 12.0 - 15.0 g/dL 15.4(H) 14.5 15.8(H)  HCT 36 - 46 % 46.1(H) 42.6 48.1(H)  PLT 150 - 400 K/uL 252 186 235  NEUTROABS 1.7 - 7.7 K/uL - - -  LYMPHSABS 0.7 - 4.0 K/uL - - -     Body mass index is 23.96 kg/m.  Orders:  Orders Placed This Encounter  Procedures  . XR Hand Complete Right   No orders of the defined types were placed in this encounter.    Procedures: No procedures performed  Clinical Data: No additional findings.  ROS:  All other systems negative, except as noted in the HPI. Review of Systems  Objective: Vital Signs: Ht 5\' 7"  (1.702 m)   Wt 153 lb (69.4 kg)   LMP  (LMP Unknown)   BMI 23.96 kg/m   Specialty Comments:  No specialty comments available.  PMFS History: Patient Active Problem List   Diagnosis Date Noted  . PAF (paroxysmal atrial fibrillation) ([place])   . Chronic cough 12/29/2017  . Atrial fibrillation with rapid ventricular response ([place]) 12/23/2017  . Acute on chronic combined systolic and diastolic CHF (congestive heart failure) ([place])   . Menopausal symptom 10/17/2017  . Menopausal syndrome 10/17/2017  . Carotid stenosis 03/27/2017  .  S/P total knee arthroplasty, right 10/23/2016  . Unilateral primary osteoarthritis, right knee 07/13/2016   Past Medical History:  Diagnosis Date  . Arthritis    oa, all over - multiple areas   . Carotid artery occlusion   . Dysrhythmia    AFib   . GERD (gastroesophageal reflux disease)   . Headache    h/o migraines   . Hypertension     Family History  Problem Relation Age of Onset  . Stroke Mother     Past Surgical History:  Procedure Laterality Date  . ABDOMINAL HYSTERECTOMY    . APPENDECTOMY    . BACK SURGERY    . CARDIOVERSION N/A 12/19/2017   Procedure: CARDIOVERSION;  Surgeon: Arnoldo Lenis, MD;  [facility];  Service: Endoscopy;  Laterality: N/A;  . CARDIOVERSION N/A 12/26/2017   Procedure: CARDIOVERSION;   Surgeon: [name], MD;  Location: [facility]OSCOPY;  Service: Cardiovascular;  Laterality: N/A;  . CARDIOVERSION N/A 01/26/2018   Procedure: CARDIOVERSION;  Surgeon: Jerline Pain, MD;  Location: [facility] ENDOSCOPY;  Service: Cardiovascular;  Laterality: N/A;  . CAROTID ENDARTERECTOMY    . ENDARTERECTOMY Left 03/27/2017   Procedure: ENDARTERECTOMY CAROTID LEFT;  Surgeon: [name], MD;  Location: [place];  Service: Vascular;  Laterality: Left;  . INNER EAR SURGERY Right    puntured eardrum- repaired 2x's   . NASAL SINUS SURGERY     deviated septum  . PATCH ANGIOPLASTY Left 03/27/2017   Procedure: PATCH ANGIOPLASTY LEFT CAROTID ARTERY USING Rueben Bash BIOLOGIC PATCH;  Surgeon: [name], MD;  Location: [place];  Service: Vascular;  Laterality: Left;  . TONSILLECTOMY    . TOTAL KNEE ARTHROPLASTY Right 10/23/2016   Procedure: RIGHT TOTAL KNEE ARTHROPLASTY;  Surgeon: [name], MD;  Location: [place];  Service: Orthopedics;  Laterality: Right;   Social History   Occupational History  . Not on file  Tobacco Use  . Smoking status: Never Smoker  . Smokeless tobacco: Never Used  Vaping Use  . Vaping Use: Never used  Substance and Sexual Activity  . Alcohol use: No  . Drug use: No  . Sexual activity: Not Currently    Birth control/protection: Surgical

## 2020-03-09 DIAGNOSIS — I10 Essential (primary) hypertension: Secondary | ICD-10-CM | POA: Diagnosis not present

## 2020-03-09 DIAGNOSIS — I4891 Unspecified atrial fibrillation: Secondary | ICD-10-CM | POA: Diagnosis not present

## 2020-03-09 DIAGNOSIS — Z299 Encounter for prophylactic measures, unspecified: Secondary | ICD-10-CM | POA: Diagnosis not present

## 2020-03-09 DIAGNOSIS — M79641 Pain in right hand: Secondary | ICD-10-CM | POA: Diagnosis not present

## 2020-03-09 DIAGNOSIS — M19049 Primary osteoarthritis, unspecified hand: Secondary | ICD-10-CM | POA: Diagnosis not present

## 2020-03-29 DIAGNOSIS — Z7189 Other specified counseling: Secondary | ICD-10-CM | POA: Diagnosis not present

## 2020-03-29 DIAGNOSIS — Z713 Dietary counseling and surveillance: Secondary | ICD-10-CM | POA: Diagnosis not present

## 2020-03-29 DIAGNOSIS — I779 Disorder of arteries and arterioles, unspecified: Secondary | ICD-10-CM | POA: Diagnosis not present

## 2020-03-29 DIAGNOSIS — I10 Essential (primary) hypertension: Secondary | ICD-10-CM | POA: Diagnosis not present

## 2020-03-29 DIAGNOSIS — R5383 Other fatigue: Secondary | ICD-10-CM | POA: Diagnosis not present

## 2020-03-29 DIAGNOSIS — E7800 Pure hypercholesterolemia, unspecified: Secondary | ICD-10-CM | POA: Diagnosis not present

## 2020-03-29 DIAGNOSIS — Z1331 Encounter for screening for depression: Secondary | ICD-10-CM | POA: Diagnosis not present

## 2020-03-29 DIAGNOSIS — Z0000 Encounter for general adult medical examination without abnormal findings: Secondary | ICD-10-CM | POA: Diagnosis not present

## 2020-03-29 DIAGNOSIS — Z1339 Encounter for screening examination for other mental health and behavioral disorders: Secondary | ICD-10-CM | POA: Diagnosis not present

## 2020-03-29 DIAGNOSIS — Z299 Encounter for prophylactic measures, unspecified: Secondary | ICD-10-CM | POA: Diagnosis not present

## 2020-03-29 DIAGNOSIS — Z79899 Other long term (current) drug therapy: Secondary | ICD-10-CM | POA: Diagnosis not present

## 2020-04-10 ENCOUNTER — Other Ambulatory Visit (HOSPITAL_COMMUNITY): Payer: Self-pay | Admitting: Nurse Practitioner

## 2020-04-13 NOTE — Progress Notes (Signed)
Office Visit Note  Patient: Lydia Graham             Date of Birth: [birth year]           MRN: [id number]             PCP: Glenda Chroman, MD Referring: Glenda Chroman, MD Visit Date: 04/14/2020  Subjective:  New Patient (Initial Visit) (Arthritis)   History of Present Illness: Lydia Graham is a 84 y.o. female here for evaluation of bilateral hand pain. She has longstanding ostoearthritis in her hands and feet with bony nodules and pain with use and tightly gripping items. Recently she has started to experience more problems with being able to hold a paintbrush or other items due to worsening and pain and reduced motion. She avoids oral NSAIDs on account of heart disease and renal function but is taking tylenol daily for pain relief. She also uses topical diclofenac for her hands sometimes. She walks with a cane for gait stability and also it reduces associated back pain. She was recently seen by Dr. Sharol Given who obtained right hand xray showing severe OA of the PIP and DIP joints and MCP joint subluxation. She is concerned about loss of function or worsening of her left hand in addition to the current hand pain.  Labs reviewed 01/2020 LFTs normal  03/2019 CBC normal TSH normal BMP normal except sCr 1.32 / eGFR 38  Activities of Daily Living:  Patient reports morning stiffness for 24 hours.   Patient Reports nocturnal pain.  Difficulty dressing/grooming: Denies Difficulty climbing stairs: Reports Difficulty getting out of chair: Reports Difficulty using hands for taps, buttons, cutlery, and/or writing: Reports  Review of Systems  Constitutional: Negative for fatigue.  HENT: Negative for mouth dryness.   Eyes: Positive for dryness.  Respiratory: Negative for shortness of breath.   Cardiovascular: Positive for swelling in legs/feet.  Gastrointestinal: Positive for constipation.  Endocrine: Negative for excessive thirst.  Genitourinary: Negative for difficulty urinating.   Musculoskeletal: Positive for arthralgias, gait problem, joint pain, joint swelling, morning stiffness and muscle tenderness.  Skin: Negative for rash.  Allergic/Immunologic: Negative for susceptible to infections.  Neurological: Negative for numbness.  Hematological: Positive for bruising/bleeding tendency.  Psychiatric/Behavioral: Negative for sleep disturbance.    PMFS History:  Patient Active Problem List   Diagnosis Date Noted   Osteoarthritis of hands, 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   Osteoarthritis of feet, 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   PAF (paroxysmal atrial fibrillation) ([facility])    Chronic cough 12/29/2017   Atrial fibrillation with rapid ventricular response ([place]) 12/23/2017   Acute on chronic combined systolic and diastolic CHF (congestive heart failure) ([place])    Menopausal symptom 10/17/2017   Menopausal syndrome 10/17/2017   Carotid stenosis 03/27/2017   S/P total knee arthroplasty, right 10/23/2016   Unilateral primary osteoarthritis, right knee 07/13/2016    Past Medical History:  Diagnosis Date   Arthritis    oa, all over - multiple areas    Carotid artery occlusion    Dysrhythmia    AFib    GERD (gastroesophageal reflux disease)    Headache    h/o migraines    Hypertension     Family History  Problem Relation Age of Onset   Stroke Mother    Rheum arthritis Sister    Past Surgical History:  Procedure Laterality Date   ABDOMINAL HYSTERECTOMY     APPENDECTOMY     BACK SURGERY     CARDIOVERSION N/A 12/19/2017   Procedure: CARDIOVERSION;  Surgeon: Arnoldo Lenis, MD;  [facility];  Service: Endoscopy;  Laterality: N/A;   CARDIOVERSION N/A 12/26/2017   Procedure: CARDIOVERSION;  Surgeon: [name], MD;  Location: [facility] ENDOSCOPY;  Service: Cardiovascular;  Laterality: N/A;   CARDIOVERSION N/A 01/26/2018   Procedure: CARDIOVERSION;  Surgeon: Jerline Pain, MD;  Location: [facility] ENDOSCOPY;  Service: Cardiovascular;  Laterality: N/A;    CAROTID ENDARTERECTOMY     ENDARTERECTOMY Left 03/27/2017   Procedure: ENDARTERECTOMY CAROTID LEFT;  Surgeon: [name], MD;  Location: [place];  Service: Vascular;  Laterality: Left;   INNER EAR SURGERY Right    puntured eardrum- repaired 2x's    KNEE ARTHROPLASTY     NASAL SINUS SURGERY     deviated septum   PATCH ANGIOPLASTY Left 03/27/2017   Procedure: PATCH ANGIOPLASTY LEFT CAROTID ARTERY USING Rueben Bash BIOLOGIC PATCH;  Surgeon: [name], MD;  Location: [place];  Service: Vascular;  Laterality: Left;   TONSILLECTOMY     TOTAL KNEE ARTHROPLASTY Right 10/23/2016   Procedure: RIGHT TOTAL KNEE ARTHROPLASTY;  Surgeon: [name], MD;  Location: [place];  Service: Orthopedics;  Laterality: Right;   Social History   Social History Narrative   Not on file   Immunization History  Administered Date(s) Administered   PFIZER SARS-COV-2 Vaccination 09/16/2019, 10/09/2019     Objective: Vital Signs: BP (!) 152/67 (BP Location: Right Arm, Patient Position: Sitting, Cuff Size: Normal)    Pulse 65    Resp 18    Ht _0  (1.651 m)    Wt 155 lb (70.3 kg)    LMP  (LMP Unknown)    BMI 25.79 kg/m    Physical Exam Eyes:     Conjunctiva/sclera: Conjunctivae normal.  Skin:    General: Skin is warm and dry.     Comments: Senile purpura on hand and forearm present  Neurological:     General: No focal deficit present.     Mental Status: She is alert.     Comments: Fine tremor present  Psychiatric:        Mood and Affect: Mood normal.      Musculoskeletal Exam: Neck full range of motion no tenderness Shoulder, elbow, wrist full range of motion no tenderness or swelling Right hand subluxation of MCP joints with minimal ulnar deviation, mild tenderness over 4th MCP, extensive heberdon's nodes over PIP joints and DIP joints with significant rotation No paraspinal tenderness to palpation over upper and lower back Knees full range of motion no tenderness or  swelling  CDAI Exam: CDAI Score: -- Patient Global: --; Provider Global: -- Swollen: --; Tender: -- Joint Exam 04/14/2020   No joint exam has been documented for this visit   There is currently no information documented on the homunculus. Go to the Rheumatology activity and complete the homunculus joint exam.  Investigation: No additional findings.  Imaging: No results found.  Recent Labs: Lab Results  Component Value Date   WBC 8.4 01/19/2018   HGB 15.4 (H) 01/19/2018   PLT 252 01/19/2018   NA 139 06/03/2018   K 5.2 06/03/2018   CL 101 06/03/2018   CO2 23 06/03/2018   GLUCOSE 83 06/03/2018   BUN 21 06/03/2018   CREATININE 1.13 (H) 06/03/2018   BILITOT 0.8 12/26/2017   ALKPHOS 95 12/26/2017   AST 32 12/26/2017   ALT 42 12/26/2017   PROT 5.7 (L) 12/26/2017   ALBUMIN 3.4 (L) 12/26/2017   CALCIUM 9.7 06/03/2018  GFRAA 53 (L) 06/03/2018    Speciality Comments: No specialty comments available.  Procedures:  No procedures performed Allergies: Rosuvastatin, Lisinopril, Other, and Singulair [montelukast sodium]   Assessment / Plan:     Visit Diagnoses: Primary osteoarthritis of both hands - Plan: Ambulatory referral to Occupational Therapy Primary osteoarthritis of both feet  She has very significant osteoarthritis changes in the hands and feet no evidence of active inflammation at this time. X-rays were recently obtained in August also showing this. No significant disease modifying agents available for hand osteoarthritis. Discussed as needed pain relievers. Discussed option of trial of soft hand brace to see if that helps symptoms. Please referral to occupational therapy for recommendations on pain management also to joint protection.  Acute on chronic combined systolic and diastolic CHF (congestive heart failure) ([facility])  Due to her history of congestive heart failure also due to impaired renal function with CKD stage III based on last metabolic panel she is not a  good candidate for systemic NSAIDs. Discussed safe dosing of Tylenol for chronic analgesia as well as topical NSAIDs for low systemic exposure.  Orders: Orders Placed This Encounter  Procedures   Ambulatory referral to Occupational Therapy   No orders of the defined types were placed in this encounter.   Follow-Up Instructions: Return if symptoms worsen or fail to improve.   Collier Salina, MD  Note - This record has been created using [employer].  Chart creation errors have been sought, but may not always  have been located. Such creation errors do not reflect on  the standard of medical care.

## 2020-04-14 ENCOUNTER — Encounter: Payer: Self-pay | Admitting: Internal Medicine

## 2020-04-14 ENCOUNTER — Ambulatory Visit (INDEPENDENT_AMBULATORY_CARE_PROVIDER_SITE_OTHER): Payer: Medicare Other | Admitting: Internal Medicine

## 2020-04-14 ENCOUNTER — Other Ambulatory Visit: Payer: Self-pay

## 2020-04-14 VITALS — BP 152/67 | HR 65 | Resp 18 | Ht 65.0 in | Wt 155.0 lb

## 2020-04-14 DIAGNOSIS — M19041 Primary osteoarthritis, right hand: Secondary | ICD-10-CM | POA: Diagnosis not present

## 2020-04-14 DIAGNOSIS — M19042 Primary osteoarthritis, left hand: Secondary | ICD-10-CM

## 2020-04-14 DIAGNOSIS — I5043 Acute on chronic combined systolic (congestive) and diastolic (congestive) heart failure: Secondary | ICD-10-CM | POA: Diagnosis not present

## 2020-04-14 DIAGNOSIS — M19071 Primary osteoarthritis, right ankle and foot: Secondary | ICD-10-CM

## 2020-04-14 DIAGNOSIS — M19072 Primary osteoarthritis, left ankle and foot: Secondary | ICD-10-CM

## 2020-04-14 DIAGNOSIS — Z1231 Encounter for screening mammogram for malignant neoplasm of breast: Secondary | ICD-10-CM | POA: Diagnosis not present

## 2020-04-14 NOTE — Patient Instructions (Addendum)
I am placing referral to Occupational Therapy to evaluate for hand protection exercises or devices.  You should receive a call for scheduling this appointment.  For your hands and feet pain you should avoid taking Tylenol greater than 3000 mg per 24 hours dose.  I would avoid oral NSAIDs such as ibuprofen or Aleve but could use topical Voltaren/diclofenac on your hands and feet would be low risk.  You could try use of soft compression glove for the hands to see if this improves pain around the knuckle joints.  This does not prevent further joint damage so if it does not improve symptoms you do not need to continue its use.  If one individual joint in particular becomes much more painful or has swelling or redness you could consider local steroid injection for the symptoms but would not do this for a large number of joints.          Osteoarthritis  Osteoarthritis is a type of arthritis that affects tissue that covers the ends of bones in joints (cartilage). Cartilage acts as a cushion between the bones and helps them move smoothly. Osteoarthritis results when cartilage in the joints gets worn down. Osteoarthritis is sometimes called "wear and tear" arthritis. Osteoarthritis is the most common form of arthritis. It often occurs in older people. It is a condition that gets worse over time (a progressive condition). Joints that are most often affected by this condition are in:  Fingers.  Toes.  Hips.  Knees.  Spine, including neck and lower back. What are the causes? This condition is caused by age-related wearing down of cartilage that covers the ends of bones. What increases the risk? The following factors may make you more likely to develop this condition:  Older age.  Being overweight or obese.  Overuse of joints, such as in athletes.  Past injury of a joint.  Past surgery on a joint.  Family history of osteoarthritis. What are the signs or symptoms? The main symptoms  of this condition are pain, swelling, and stiffness in the joint. The joint may lose its shape over time. Small pieces of bone or cartilage may break off and float inside of the joint, which may cause more pain and damage to the joint. Small deposits of bone (osteophytes) may grow on the edges of the joint. Other symptoms may include:  A grating or scraping feeling inside the joint when you move it.  Popping or creaking sounds when you move. Symptoms may affect one or more joints. Osteoarthritis in a major joint, such as your knee or hip, can make it painful to walk or exercise. If you have osteoarthritis in your hands, you might not be able to grip items, twist your hand, or control small movements of your hands and fingers (fine motor skills). How is this diagnosed? This condition may be diagnosed based on:  Your medical history.  A physical exam.  Your symptoms.  X-rays of the affected joint(s).  Blood tests to rule out other types of arthritis. How is this treated? There is no cure for this condition, but treatment can help to control pain and improve joint function. Treatment plans may include:  A prescribed exercise program that allows for rest and joint relief. You may work with a physical therapist.  A weight control plan.  Pain relief techniques, such as: ? Applying heat and cold to the joint. ? Electric pulses delivered to nerve endings under the skin (transcutaneous electrical nerve stimulation, or TENS). ? Massage. ?  Certain nutritional supplements.  NSAIDs or prescription medicines to help relieve pain.  Medicine to help relieve pain and inflammation (corticosteroids). This can be given by mouth (orally) or as an injection.  Assistive devices, such as a brace, wrap, splint, specialized glove, or cane.  Surgery, such as: ? An osteotomy. This is done to reposition the bones and relieve pain or to remove loose pieces of bone and cartilage. ? Joint replacement surgery.  You may need this surgery if you have very bad (advanced) osteoarthritis. Follow these instructions at home: Activity  Rest your affected joints as directed by your health care provider.  Do not drive or use heavy machinery while taking prescription pain medicine.  Exercise as directed. Your health care provider or physical therapist may recommend specific types of exercise, such as: ? Strengthening exercises. These are done to strengthen the muscles that support joints that are affected by arthritis. They can be performed with weights or with exercise bands to add resistance. ? Aerobic activities. These are exercises, such as brisk walking or water aerobics, that get your heart pumping. ? Range-of-motion activities. These keep your joints easy to move. ? Balance and agility exercises. Managing pain, stiffness, and swelling      If directed, apply heat to the affected area as often as told by your health care provider. Use the heat source that your health care provider recommends, such as a moist heat pack or a heating pad. ? If you have a removable assistive device, remove it as told by your health care provider. ? Place a towel between your skin and the heat source. If your health care provider tells you to keep the assistive device on while you apply heat, place a towel between the assistive device and the heat source. ? Leave the heat on for 20-30 minutes. ? Remove the heat if your skin turns bright red. This is especially important if you are unable to feel pain, heat, or cold. You may have a greater risk of getting burned.  If directed, put ice on the affected joint: ? If you have a removable assistive device, remove it as told by your health care provider. ? Put ice in a plastic bag. ? Place a towel between your skin and the bag. If your health care provider tells you to keep the assistive device on during icing, place a towel between the assistive device and the bag. ? Leave the  ice on for 20 minutes, 2-3 times a day. General instructions  Take over-the-counter and prescription medicines only as told by your health care provider.  Maintain a healthy weight. Follow instructions from your health care provider for weight control. These may include dietary restrictions.  Do not use any products that contain nicotine or tobacco, such as cigarettes and e-cigarettes. These can delay bone healing. If you need help quitting, ask your health care provider.  Use assistive devices as directed by your health care provider.  Keep all follow-up visits as told by your health care provider. This is important. Where to find more information  [employer] of Arthritis and Musculoskeletal and Skin Diseases: www.niams.SouthExposed.es  [employer] on Aging: http://kim-miller.com/  American College of Rheumatology: www.rheumatology.org Contact a health care provider if:  Your skin turns red.  You develop a rash.  You have pain that gets worse.  You have a fever along with joint or muscle aches. Get help right away if:  You lose a lot of weight.  You suddenly lose your appetite.  You have night sweats. Summary  Osteoarthritis is a type of arthritis that affects tissue covering the ends of bones in joints (cartilage).  This condition is caused by age-related wearing down of cartilage that covers the ends of bones.  The main symptom of this condition is pain, swelling, and stiffness in the joint.  There is no cure for this condition, but treatment can help to control pain and improve joint function. This information is not intended to replace advice given to you by your health care provider. Make sure you discuss any questions you have with your health care provider. Document Revised: 05/23/2017 Document Reviewed: 02/12/2016 Elsevier Patient Education  2020 [employer].

## 2020-04-19 ENCOUNTER — Other Ambulatory Visit: Payer: Self-pay | Admitting: Obstetrics & Gynecology

## 2020-04-19 DIAGNOSIS — R928 Other abnormal and inconclusive findings on diagnostic imaging of breast: Secondary | ICD-10-CM

## 2020-05-01 ENCOUNTER — Other Ambulatory Visit: Payer: Self-pay | Admitting: *Deleted

## 2020-05-01 MED ORDER — PANTOPRAZOLE SODIUM 40 MG PO TBEC: 40.0000 mg | DELAYED_RELEASE_TABLET | Freq: Two times a day (BID) | ORAL | 6 refills | Status: DC

## 2020-05-01 MED ORDER — LOSARTAN POTASSIUM 50 MG PO TABS: ORAL_TABLET | ORAL | 1 refills | Status: DC

## 2020-05-04 ENCOUNTER — Telehealth: Payer: Self-pay

## 2020-05-04 NOTE — Telephone Encounter (Signed)
Patient stopped by the office to check on her referral for hand therapy.  Patient states she had appointment on 04/14/20 with Dr. Benjamine Mola and hasn't received a call to schedule an appointment.  Patient states she would prefer to go to a facility closer to home.  Patient requested the referral be sent to [facility] on [facility] in [place].

## 2020-05-04 NOTE — Telephone Encounter (Signed)
I called patient, referral faxed, pending appt. 

## 2020-05-05 ENCOUNTER — Other Ambulatory Visit: Payer: Self-pay | Admitting: *Deleted

## 2020-05-05 MED ORDER — AMLODIPINE BESYLATE 5 MG PO TABS
ORAL_TABLET | ORAL | 1 refills | Status: DC
Start: 2020-05-05 — End: 2020-08-01

## 2020-05-08 ENCOUNTER — Ambulatory Visit
Admission: RE | Admit: 2020-05-08 | Discharge: 2020-05-08 | Disposition: A | Discharge status: Home / Self Care | Payer: Medicare Other | Source: Ambulatory Visit | Attending: Obstetrics & Gynecology | Admitting: Obstetrics & Gynecology

## 2020-05-08 ENCOUNTER — Other Ambulatory Visit: Payer: Self-pay

## 2020-05-08 DIAGNOSIS — R928 Other abnormal and inconclusive findings on diagnostic imaging of breast: Secondary | ICD-10-CM

## 2020-05-12 DIAGNOSIS — M79642 Pain in left hand: Secondary | ICD-10-CM | POA: Diagnosis not present

## 2020-05-12 DIAGNOSIS — M79641 Pain in right hand: Secondary | ICD-10-CM | POA: Diagnosis not present

## 2020-05-12 DIAGNOSIS — R531 Weakness: Secondary | ICD-10-CM | POA: Diagnosis not present

## 2020-05-12 DIAGNOSIS — M25641 Stiffness of right hand, not elsewhere classified: Secondary | ICD-10-CM | POA: Diagnosis not present

## 2020-05-12 DIAGNOSIS — M25642 Stiffness of left hand, not elsewhere classified: Secondary | ICD-10-CM | POA: Diagnosis not present

## 2020-05-12 DIAGNOSIS — M150 Primary generalized (osteo)arthritis: Secondary | ICD-10-CM | POA: Diagnosis not present

## 2020-05-14 DIAGNOSIS — M25641 Stiffness of right hand, not elsewhere classified: Secondary | ICD-10-CM | POA: Diagnosis not present

## 2020-05-14 DIAGNOSIS — M150 Primary generalized (osteo)arthritis: Secondary | ICD-10-CM | POA: Diagnosis not present

## 2020-05-14 DIAGNOSIS — M25642 Stiffness of left hand, not elsewhere classified: Secondary | ICD-10-CM | POA: Diagnosis not present

## 2020-05-14 DIAGNOSIS — R531 Weakness: Secondary | ICD-10-CM | POA: Diagnosis not present

## 2020-05-14 DIAGNOSIS — M79642 Pain in left hand: Secondary | ICD-10-CM | POA: Diagnosis not present

## 2020-05-14 DIAGNOSIS — M79641 Pain in right hand: Secondary | ICD-10-CM | POA: Diagnosis not present

## 2020-05-17 DIAGNOSIS — M150 Primary generalized (osteo)arthritis: Secondary | ICD-10-CM | POA: Diagnosis not present

## 2020-05-17 DIAGNOSIS — M25642 Stiffness of left hand, not elsewhere classified: Secondary | ICD-10-CM | POA: Diagnosis not present

## 2020-05-17 DIAGNOSIS — M79641 Pain in right hand: Secondary | ICD-10-CM | POA: Diagnosis not present

## 2020-05-17 DIAGNOSIS — M25641 Stiffness of right hand, not elsewhere classified: Secondary | ICD-10-CM | POA: Diagnosis not present

## 2020-05-17 DIAGNOSIS — R531 Weakness: Secondary | ICD-10-CM | POA: Diagnosis not present

## 2020-05-17 DIAGNOSIS — M79642 Pain in left hand: Secondary | ICD-10-CM | POA: Diagnosis not present

## 2020-05-22 DIAGNOSIS — Z23 Encounter for immunization: Secondary | ICD-10-CM | POA: Diagnosis not present

## 2020-05-23 DIAGNOSIS — R531 Weakness: Secondary | ICD-10-CM | POA: Diagnosis not present

## 2020-05-23 DIAGNOSIS — M79641 Pain in right hand: Secondary | ICD-10-CM | POA: Diagnosis not present

## 2020-05-23 DIAGNOSIS — M25641 Stiffness of right hand, not elsewhere classified: Secondary | ICD-10-CM | POA: Diagnosis not present

## 2020-05-23 DIAGNOSIS — M25642 Stiffness of left hand, not elsewhere classified: Secondary | ICD-10-CM | POA: Diagnosis not present

## 2020-05-23 DIAGNOSIS — M79642 Pain in left hand: Secondary | ICD-10-CM | POA: Diagnosis not present

## 2020-05-23 DIAGNOSIS — M150 Primary generalized (osteo)arthritis: Secondary | ICD-10-CM | POA: Diagnosis not present

## 2020-05-29 DIAGNOSIS — Z85828 Personal history of other malignant neoplasm of skin: Secondary | ICD-10-CM | POA: Diagnosis not present

## 2020-05-29 DIAGNOSIS — L570 Actinic keratosis: Secondary | ICD-10-CM | POA: Diagnosis not present

## 2020-05-29 DIAGNOSIS — L814 Other melanin hyperpigmentation: Secondary | ICD-10-CM | POA: Diagnosis not present

## 2020-06-01 ENCOUNTER — Other Ambulatory Visit: Payer: Self-pay | Admitting: *Deleted

## 2020-06-01 DIAGNOSIS — I6522 Occlusion and stenosis of left carotid artery: Secondary | ICD-10-CM

## 2020-06-12 ENCOUNTER — Ambulatory Visit (HOSPITAL_COMMUNITY)
Admission: RE | Admit: 2020-06-12 | Discharge: 2020-06-12 | Disposition: A | Discharge status: Home / Self Care | Payer: Medicare Other | Source: Ambulatory Visit | Attending: Surgery | Admitting: Surgery

## 2020-06-12 ENCOUNTER — Other Ambulatory Visit: Payer: Self-pay

## 2020-06-12 ENCOUNTER — Ambulatory Visit (INDEPENDENT_AMBULATORY_CARE_PROVIDER_SITE_OTHER): Payer: Medicare Other | Admitting: Physician Assistant

## 2020-06-12 VITALS — BP 150/69 | HR 62 | Temp 97.7°F | Resp 20 | Ht 65.0 in | Wt 156.1 lb

## 2020-06-12 DIAGNOSIS — I6522 Occlusion and stenosis of left carotid artery: Secondary | ICD-10-CM | POA: Diagnosis not present

## 2020-06-13 ENCOUNTER — Encounter: Payer: Self-pay | Admitting: Physician Assistant

## 2020-06-13 NOTE — Progress Notes (Signed)
History of Present Illness:  Patient is a 84 y.o. year old female who presents for evaluation of carotid stenosis.  The patient denies symptoms of TIA, amaurosis, or stroke.   She is status post left carotid endarterectomy on 10-4-18by Dr. Jason Fila asymptomatic very high-grade stenosis.     Past Medical History:  Diagnosis Date  . Arthritis    oa, all over - multiple areas   . Carotid artery occlusion   . Dysrhythmia    AFib   . GERD (gastroesophageal reflux disease)   . Headache    h/o migraines   . Hypertension     Past Surgical History:  Procedure Laterality Date  . ABDOMINAL HYSTERECTOMY    . APPENDECTOMY    . BACK SURGERY    . CARDIOVERSION N/A 12/19/2017   Procedure: CARDIOVERSION;  Surgeon: Arnoldo Lenis, MD;  Location: [facility];  Service: Endoscopy;  Laterality: N/A;  . CARDIOVERSION N/A 12/26/2017   Procedure: CARDIOVERSION;  Surgeon: [name], MD;  Location: [facility] ENDOSCOPY;  Service: Cardiovascular;  Laterality: N/A;  . CARDIOVERSION N/A 01/26/2018   Procedure: CARDIOVERSION;  Surgeon: Jerline Pain, MD;  Location: [facility] ENDOSCOPY;  Service: Cardiovascular;  Laterality: N/A;  . CAROTID ENDARTERECTOMY    . ENDARTERECTOMY Left 03/27/2017   Procedure: ENDARTERECTOMY CAROTID LEFT;  Surgeon: [name], MD;  Location: [place];  Service: Vascular;  Laterality: Left;  . INNER EAR SURGERY Right    puntured eardrum- repaired 2x's   . KNEE ARTHROPLASTY    . NASAL SINUS SURGERY     deviated septum  . PATCH ANGIOPLASTY Left 03/27/2017   Procedure: PATCH ANGIOPLASTY LEFT CAROTID ARTERY USING Rueben Bash BIOLOGIC PATCH;  Surgeon: [name], MD;  Location: [place];  Service: Vascular;  Laterality: Left;  . TONSILLECTOMY    . TOTAL KNEE ARTHROPLASTY Right 10/23/2016   Procedure: RIGHT TOTAL KNEE ARTHROPLASTY;  Surgeon: [name], MD;  Location: [place];  Service: Orthopedics;  Laterality: Right;     Social History Social History    Tobacco Use  . Smoking status: Never Smoker  . Smokeless tobacco: Never Used  Vaping Use  . Vaping Use: Never used  Substance Use Topics  . Alcohol use: No  . Drug use: No    Family History Family History  Problem Relation Age of Onset  . Stroke Mother   . Rheum arthritis Sister     Allergies  Allergies  Allergen Reactions  . Rosuvastatin Other (See Comments)    MYALGIA, Muscle pain  . Lisinopril Cough  . Other Other (See Comments)    PAIN MEDICATIONS/ANESTHESIA  MADE BP DROP LOW   . Singulair [Montelukast Sodium] Other (See Comments)    fatigue     Current Outpatient Medications  Medication Sig Dispense Refill  . amiodarone (PACERONE) 200 MG tablet TAKE 1/2 TABLET EVERY OTHER DAY. 25 tablet 3  . amLODipine (NORVASC) 5 MG tablet TAKE 1 TABLET TWICE AT 8 AM AND 8 PM. 180 tablet 1  . Calcium Carbonate-Vitamin D (CALCIUM 600+D PO) Take 1 tablet by mouth daily.    . diclofenac Sodium (VOLTAREN) 1 % GEL Apply topically 4 (four) times daily.    Marland Kitchen ELIQUIS 5 MG TABS tablet TAKE (1) TABLET TWICE DAILY. 60 tablet 6  . estradiol (ESTRACE) 0.5 MG tablet Take 0.5 mg by mouth daily.     . fluticasone (FLONASE) 50 MCG/ACT nasal spray Place 2 sprays into both nostrils daily as needed for allergies.     Marland Kitchen  furosemide (LASIX) 20 MG tablet Take 1 tablet (20 mg total) by mouth daily as needed for edema (leg swelling / shortness of breath). 30 tablet 6  . Hypromellose (ARTIFICIAL TEARS OP) Place 2 drops into both eyes 2 (two) times daily as needed (for dry eyes).    Marland Kitchen losartan (COZAAR) 50 MG tablet TAKE 1 TABLET AT 10 AM AND 1 TABLET AT 10 PM. 180 tablet 1  . magnesium hydroxide (MILK OF MAGNESIA) 400 MG/5ML suspension Take 30 mLs by mouth daily as needed for mild constipation.    . meloxicam (MOBIC) 7.5 MG tablet Take 7.5 mg by mouth daily.    . metoprolol succinate (TOPROL-XL) 50 MG 24 hr tablet TAKE 1 TABLET AT LUNCH OR IMMEDIATELY FOLLOWING A MEAL. 90 tablet 3  . pantoprazole  (PROTONIX) 40 MG tablet Take 1 tablet (40 mg total) by mouth 2 (two) times daily. 60 tablet 6  . potassium chloride SA (K-DUR,KLOR-CON) 20 MEQ tablet Take 1 tablet (20 mEq total) by mouth as needed (take on days you take your Furosemide).    . vitamin B-12 (CYANOCOBALAMIN) 1000 MCG tablet Take 1,000 mcg by mouth daily.     No current facility-administered medications for this visit.    ROS:   General:  No weight loss, Fever, chills  HEENT: No recent headaches, no nasal bleeding, no visual changes, no sore throat  Neurologic: No dizziness, blackouts, seizures. No recent symptoms of stroke or mini- stroke. No recent episodes of slurred speech, or temporary blindness.  Cardiac: No recent episodes of chest pain/pressure, no shortness of breath at rest.  No shortness of breath with exertion.  Denies history of atrial fibrillation or irregular heartbeat  Vascular: No history of rest pain in feet.  No history of claudication.  No history of non-healing ulcer, No history of DVT   Pulmonary: No home oxygen, no productive cough, no hemoptysis,  No asthma or wheezing  Musculoskeletal:  [ ]  Arthritis, [ ]  Low back pain,  [ ]  Joint pain  Hematologic:No history of hypercoagulable state.  No history of easy bleeding.  No history of anemia  Gastrointestinal: No hematochezia or melena,  No gastroesophageal reflux, no trouble swallowing  Urinary: [ ]  chronic Kidney disease, [ ]  on HD - [ ]  MWF or [ ]  TTHS, [ ]  Burning with urination, [ ]  Frequent urination, [ ]  Difficulty urinating;   Skin: No rashes  Psychological: No history of anxiety,  No history of depression   Physical Examination  Vitals:   06/12/20 1505 06/12/20 1507  BP: (!) 146/74 (!) 150/69  Pulse: 62   Resp: 20   Temp: 97.7 F (36.5 C)   TempSrc: Temporal   SpO2: 97%   Weight: 156 lb 1.6 oz (70.8 kg)   Height: 5\' 5"  (1.651 m)     Body mass index is 25.98 kg/m.  General:  Alert and oriented, no acute distress HEENT:  Normal Neck: No bruit or JVD Pulmonary: Clear to auscultation bilaterally Cardiac: Regular Rate and Rhythm without murmur Gastrointestinal: Soft, non-tender, non-distended, no mass, no scars Skin: No rash Extremity Pulses:  2+ radial, brachial, femoral, dorsalis pedis, posterior tibial pulses bilaterally Musculoskeletal: No deformity or edema  Neurologic: Upper and lower extremity motor 5/5 and symmetric  DATA:  Right Carotid Findings:  +----------+--------+--------+--------+------------------+--------+       PSV cm/sEDV cm/sStenosisPlaque DescriptionComments  +----------+--------+--------+--------+------------------+--------+  CCA Prox 74   9                       +----------+--------+--------+--------+------------------+--------+  CCA Mid  113   17                      +----------+--------+--------+--------+------------------+--------+  CCA Distal96   14       heterogenous         +----------+--------+--------+--------+------------------+--------+  ICA Prox 116   23   1-39%  heterogenous         +----------+--------+--------+--------+------------------+--------+  ICA Mid  120   21                      +----------+--------+--------+--------+------------------+--------+  ICA Distal67   17                      +----------+--------+--------+--------+------------------+--------+  ECA    104   0        heterogenous         +----------+--------+--------+--------+------------------+--------+   +----------+--------+-------+----------------+-------------------+       PSV cm/sEDV cmsDescribe    Arm Pressure (mmHG)  +----------+--------+-------+----------------+-------------------+  Subclavian190       Multiphasic, WNL             +----------+--------+-------+----------------+-------------------+   +---------+--------+--+--------+--+---------+  VertebralPSV cm/s53EDV cm/s10Antegrade  +---------+--------+--+--------+--+---------+      Left Carotid Findings:  +----------+--------+--------+--------+------------------+--------+       PSV cm/sEDV cm/sStenosisPlaque DescriptionComments  +----------+--------+--------+--------+------------------+--------+  CCA Prox 122   13                      +----------+--------+--------+--------+------------------+--------+  CCA Mid  103   15                      +----------+--------+--------+--------+------------------+--------+  CCA Distal72   13       heterogenous         +----------+--------+--------+--------+------------------+--------+  ICA Prox 85   20   1-39%  heterogenous         +----------+--------+--------+--------+------------------+--------+  ICA Mid  140   30                      +----------+--------+--------+--------+------------------+--------+  ICA Distal81   21                      +----------+--------+--------+--------+------------------+--------+  ECA    168   0                       +----------+--------+--------+--------+------------------+--------+   +----------+--------+--------+---------+-------------------+       PSV cm/sEDV cm/sDescribe Arm Pressure (mmHG)  +----------+--------+--------+---------+-------------------+  [id number]       Turbulent            +----------+--------+--------+---------+-------------------+   +---------+--------+--+--------+-+---------+  VertebralPSV cm/s53EDV cm/s9Antegrade  +---------+--------+--+--------+-+---------+     Summary:  Right Carotid: Velocities in the  right ICA are consistent with a 1-39%  stenosis.   Left Carotid: Velocities in the left ICA are consistent with a 1-39%  stenosis.        CEA Patent.   Vertebrals: Bilateral vertebral arteries demonstrate antegrade flow.  Subclavians: Left subclavian artery flow was disturbed. Normal flow  hemodynamics        were seen in the right subclavian artery.   ASSESSMENT:  Asymptomatic carotid stenosis with out change on carotid duplex.  B ICA stenosis< 39% S/P left CEA 10-4-18by Dr. Donzetta Matters   PLAN: Continue to stay active and  maintain a healthy diet.  If she develops symptoms of stroke or TIA she will call 911.  F/U in 1 year for repeat carotid duplex.    Lydia Horseman PA-C [facility] [place] Office: [phone]  MD in clinic [place]

## 2020-06-19 ENCOUNTER — Other Ambulatory Visit: Payer: Self-pay

## 2020-06-19 MED ORDER — APIXABAN 5 MG PO TABS: ORAL_TABLET | ORAL | 3 refills | Status: DC

## 2020-06-19 NOTE — Telephone Encounter (Signed)
Refill Request for Eliquis 5 mg tablet approved.

## 2020-06-22 DIAGNOSIS — M79641 Pain in right hand: Secondary | ICD-10-CM | POA: Diagnosis not present

## 2020-06-22 DIAGNOSIS — M13841 Other specified arthritis, right hand: Secondary | ICD-10-CM | POA: Diagnosis not present

## 2020-06-22 DIAGNOSIS — M79642 Pain in left hand: Secondary | ICD-10-CM | POA: Diagnosis not present

## 2020-06-22 DIAGNOSIS — M65321 Trigger finger, right index finger: Secondary | ICD-10-CM | POA: Diagnosis not present

## 2020-06-22 DIAGNOSIS — M65332 Trigger finger, left middle finger: Secondary | ICD-10-CM | POA: Diagnosis not present

## 2020-07-21 ENCOUNTER — Telehealth: Payer: Self-pay | Admitting: Pharmacist

## 2020-07-21 NOTE — Telephone Encounter (Signed)
PA for Eliquis received. PA submitted. Called patient, she does not have copay card. Activated her a copay card and called it into pharmacy. They were able to process for $10.  Called patient to let her know. She was very Patent attorney.

## 2020-07-24 NOTE — Telephone Encounter (Signed)
Signature needed for [place] to appeal on patients behalf. Form sent to [facility] in [place]. Pt will stop by today to sign the form and it will be sent back to [place] to fax along with her appeals letter.

## 2020-07-25 NOTE — Telephone Encounter (Signed)
representative form and appeals letter faxed 2/1

## 2020-07-28 ENCOUNTER — Encounter: Payer: Self-pay | Admitting: Internal Medicine

## 2020-07-28 ENCOUNTER — Ambulatory Visit (INDEPENDENT_AMBULATORY_CARE_PROVIDER_SITE_OTHER): Payer: Medicare Other | Admitting: Internal Medicine

## 2020-07-28 ENCOUNTER — Encounter: Payer: Medicare Other | Admitting: Internal Medicine

## 2020-07-28 VITALS — BP 136/66 | HR 70 | Ht 67.0 in | Wt 155.0 lb

## 2020-07-28 DIAGNOSIS — D6869 Other thrombophilia: Secondary | ICD-10-CM

## 2020-07-28 DIAGNOSIS — Z79899 Other long term (current) drug therapy: Secondary | ICD-10-CM

## 2020-07-28 DIAGNOSIS — I480 Paroxysmal atrial fibrillation: Secondary | ICD-10-CM

## 2020-07-28 DIAGNOSIS — I10 Essential (primary) hypertension: Secondary | ICD-10-CM | POA: Diagnosis not present

## 2020-07-28 NOTE — Patient Instructions (Addendum)
Medication Instructions:  Continue all current medications.  Labwork:  CMET, TSH, T4 - orders given today.   Office will contact with results via phone or letter.    Testing/Procedures: none  Follow-Up:  6 months - routine cardiology  1 year - [facility]   Any Other Special Instructions Will Be Listed Below (If Applicable).  If you need a refill on your cardiac medications before your next appointment, please call your pharmacy.

## 2020-07-28 NOTE — Progress Notes (Signed)
PCP: Glenda Chroman, MD   Primary EP: Dr Rayann Heman  Lydia Graham is a 85 y.o. female who presents today for routine electrophysiology followup.  Since last being seen in our clinic, the patient reports doing very well.  Today, she denies symptoms of palpitations, chest pain, shortness of breath,  lower extremity edema, dizziness, presyncope, or syncope.  The patient is otherwise without complaint today.   Past Medical History:  Diagnosis Date  . Arthritis    oa, all over - multiple areas   . Carotid artery occlusion   . Dysrhythmia    AFib   . GERD (gastroesophageal reflux disease)   . Headache    h/o migraines   . Hypertension    Past Surgical History:  Procedure Laterality Date  . ABDOMINAL HYSTERECTOMY    . APPENDECTOMY    . BACK SURGERY    . CARDIOVERSION N/A 12/19/2017   Procedure: CARDIOVERSION;  Surgeon: Arnoldo Lenis, MD;  [facility];  Service: Endoscopy;  Laterality: N/A;  . CARDIOVERSION N/A 12/26/2017   Procedure: CARDIOVERSION;  Surgeon: [name], MD;  Location: [facility] ENDOSCOPY;  Service: Cardiovascular;  Laterality: N/A;  . CARDIOVERSION N/A 01/26/2018   Procedure: CARDIOVERSION;  Surgeon: Jerline Pain, MD;  Location: [facility] ENDOSCOPY;  Service: Cardiovascular;  Laterality: N/A;  . CAROTID ENDARTERECTOMY    . ENDARTERECTOMY Left 03/27/2017   Procedure: ENDARTERECTOMY CAROTID LEFT;  Surgeon: [name], MD;  Location: [place];  Service: Vascular;  Laterality: Left;  . INNER EAR SURGERY Right    puntured eardrum- repaired 2x's   . KNEE ARTHROPLASTY    . NASAL SINUS SURGERY     deviated septum  . PATCH ANGIOPLASTY Left 03/27/2017   Procedure: PATCH ANGIOPLASTY LEFT CAROTID ARTERY USING Rueben Bash BIOLOGIC PATCH;  Surgeon: [name], MD;  Location: [place];  Service: Vascular;  Laterality: Left;  . TONSILLECTOMY    . TOTAL KNEE ARTHROPLASTY Right 10/23/2016   Procedure: RIGHT TOTAL KNEE ARTHROPLASTY;  Surgeon: [name], MD;  Location: [place];  Service: Orthopedics;  Laterality: Right;    ROS- all systems are reviewed and negatives except as per HPI above  Current Outpatient Medications  Medication Sig Dispense Refill  . amiodarone (PACERONE) 200 MG tablet TAKE 1/2 TABLET EVERY OTHER DAY. 25 tablet 3  . amLODipine (NORVASC) 5 MG tablet TAKE 1 TABLET TWICE AT 8 AM AND 8 PM. 180 tablet 1  . apixaban (ELIQUIS) 5 MG TABS tablet TAKE (1) TABLET TWICE DAILY. 90 tablet 3  . Calcium Carbonate-Vitamin D (CALCIUM 600+D PO) Take 1 tablet by mouth daily.    Marland Kitchen estradiol (ESTRACE) 0.5 MG tablet Take 0.5 mg by mouth daily.     . fluticasone (FLONASE) 50 MCG/ACT nasal spray Place 2 sprays into both nostrils daily as needed for allergies.     . furosemide (LASIX) 20 MG tablet Take 1 tablet (20 mg total) by mouth daily as needed for edema (leg swelling / shortness of breath). 30 tablet 6  . Hypromellose (ARTIFICIAL TEARS OP) Place 2 drops into both eyes 2 (two) times daily as needed (for dry eyes).    Marland Kitchen losartan (COZAAR) 50 MG tablet TAKE 1 TABLET AT 10 AM AND 1 TABLET AT 10 PM. 180 tablet 1  . magnesium hydroxide (MILK OF MAGNESIA) 400 MG/5ML suspension Take 30 mLs by mouth daily as needed for mild constipation.    . meloxicam (MOBIC) 7.5 MG tablet Take 7.5 mg by mouth  daily.    . metoprolol succinate (TOPROL-XL) 50 MG 24 hr tablet TAKE 1 TABLET AT LUNCH OR IMMEDIATELY FOLLOWING A MEAL. 90 tablet 3  . pantoprazole (PROTONIX) 40 MG tablet Take 1 tablet (40 mg total) by mouth 2 (two) times daily. 60 tablet 6  . potassium chloride SA (K-DUR,KLOR-CON) 20 MEQ tablet Take 1 tablet (20 mEq total) by mouth as needed (take on days you take your Furosemide).    . vitamin B-12 (CYANOCOBALAMIN) 1000 MCG tablet Take 1,000 mcg by mouth daily.     No current facility-administered medications for this visit.    Physical Exam: Vitals:   07/28/20 0914  BP: 136/66  Pulse: 70  SpO2: 96%  Weight: 155 lb (70.3 kg)  Height: 5\' 7"  (1.702  m)    GEN- The patient is well appearing, alert and oriented x 3 today.   Head- normocephalic, atraumatic Eyes-  Sclera clear, conjunctiva pink Ears- hearing intact Oropharynx- clear Lungs- Clear to ausculation bilaterally, normal work of breathing Heart- Regular rate and rhythm, no murmurs, rubs or gallops, PMI not laterally displaced GI- soft, NT, ND, + BS Extremities- no clubbing, cyanosis, or edema  Wt Readings from Last 3 Encounters:  07/28/20 155 lb (70.3 kg)  06/12/20 156 lb 1.6 oz (70.8 kg)  04/14/20 155 lb (70.3 kg)    EKG tracing ordered today is personally reviewed and shows sinus with incomplete lbbb  Assessment and Plan:  1. Persistent atrial fibrillation Doing very well with amiodarone 100mg  QOD We will need to follow her closely on this medicine to avoid toxicity We will obtain lfts, tfts today  chads2vasc score is at least 5 Check bmet today  Continue eliquis  2. HTN Stable No change required today I have advised that she avoid NSAIDs if able  3. Chronic diastolic dysfunction Stable No change required today  4. S/p prior CEA for L ICA stenosis in 2018 Stable No change required today Follows with vascular surgery  Risks, benefits and potential toxicities for medications prescribed and/or refilled reviewed with patient today.   Return to see Katina Dung in 6 months I will see in a year  [name] MD, [facility] 07/28/2020 9:23 AM

## 2020-08-01 ENCOUNTER — Other Ambulatory Visit: Payer: Self-pay | Admitting: Cardiology

## 2020-08-02 ENCOUNTER — Telehealth: Payer: Self-pay | Admitting: Internal Medicine

## 2020-08-02 NOTE — Telephone Encounter (Signed)
New message    Patient is returning a call from yesterday

## 2020-08-03 DIAGNOSIS — M13849 Other specified arthritis, unspecified hand: Secondary | ICD-10-CM | POA: Diagnosis not present

## 2020-08-03 DIAGNOSIS — M79641 Pain in right hand: Secondary | ICD-10-CM | POA: Diagnosis not present

## 2020-08-03 DIAGNOSIS — M19049 Primary osteoarthritis, unspecified hand: Secondary | ICD-10-CM | POA: Insufficient documentation

## 2020-08-03 DIAGNOSIS — M65332 Trigger finger, left middle finger: Secondary | ICD-10-CM | POA: Diagnosis not present

## 2020-08-03 DIAGNOSIS — M79642 Pain in left hand: Secondary | ICD-10-CM | POA: Diagnosis not present

## 2020-08-03 NOTE — Telephone Encounter (Signed)
Detailed message left that I did not find any notes like anyone had called her.    Just seen on 07/28/2020 by [name].   Not due to see him again till one year & 6 months for Dr. Harl Bowie.  Both of these have been scheduled already.

## 2020-08-04 DIAGNOSIS — M19049 Primary osteoarthritis, unspecified hand: Secondary | ICD-10-CM | POA: Insufficient documentation

## 2020-08-04 DIAGNOSIS — M65332 Trigger finger, left middle finger: Secondary | ICD-10-CM | POA: Insufficient documentation

## 2020-08-08 ENCOUNTER — Telehealth: Payer: Self-pay | Admitting: Pharmacist

## 2020-08-08 MED ORDER — APIXABAN 5 MG PO TABS: ORAL_TABLET | ORAL | 1 refills | Status: DC

## 2020-08-08 NOTE — Telephone Encounter (Signed)
Eliquis appeal has been overturned and is now covered by [employer]. Called pt to make her aware, she was appreciative for assistance.

## 2020-09-15 DIAGNOSIS — H2513 Age-related nuclear cataract, bilateral: Secondary | ICD-10-CM | POA: Diagnosis not present

## 2020-09-18 DIAGNOSIS — M65341 Trigger finger, right ring finger: Secondary | ICD-10-CM | POA: Diagnosis not present

## 2020-09-18 DIAGNOSIS — M13841 Other specified arthritis, right hand: Secondary | ICD-10-CM | POA: Diagnosis not present

## 2020-09-18 DIAGNOSIS — M13842 Other specified arthritis, left hand: Secondary | ICD-10-CM | POA: Diagnosis not present

## 2020-09-18 DIAGNOSIS — M65332 Trigger finger, left middle finger: Secondary | ICD-10-CM | POA: Diagnosis not present

## 2020-09-21 ENCOUNTER — Telehealth: Payer: Self-pay | Admitting: Internal Medicine

## 2020-09-21 NOTE — Telephone Encounter (Signed)
Left message to return call 

## 2020-09-21 NOTE — Telephone Encounter (Signed)
Pt is calling after being transferred from billing  needing  Dr Rayann Heman to recode her visit from February.

## 2020-09-25 NOTE — Telephone Encounter (Signed)
Patient called back trying to reach [facility]

## 2020-09-25 NOTE — Telephone Encounter (Signed)
Patient returning call to [facility] from Friday

## 2020-09-25 NOTE — Telephone Encounter (Signed)
Left message to return call 

## 2020-09-25 NOTE — Telephone Encounter (Signed)
Message has been sent to pcc for [name].  Gracy Bruins)

## 2020-09-26 NOTE — Telephone Encounter (Signed)
Staff message received for pt with billing question. Staff message forwarded to [facility] billing.

## 2020-09-29 ENCOUNTER — Telehealth: Payer: Self-pay | Admitting: Internal Medicine

## 2020-09-29 NOTE — Telephone Encounter (Signed)
Patient has a note in her chart dated 09/21/2020. It has something to do with a bill from Dr.Allred stating that Dr.Allred needs to re code in order for insurance to pay

## 2020-10-20 ENCOUNTER — Other Ambulatory Visit (HOSPITAL_COMMUNITY): Payer: Self-pay | Admitting: Internal Medicine

## 2020-10-30 ENCOUNTER — Other Ambulatory Visit: Payer: Self-pay | Admitting: Internal Medicine

## 2020-12-07 DIAGNOSIS — I4891 Unspecified atrial fibrillation: Secondary | ICD-10-CM | POA: Diagnosis not present

## 2020-12-07 DIAGNOSIS — Z299 Encounter for prophylactic measures, unspecified: Secondary | ICD-10-CM | POA: Diagnosis not present

## 2020-12-07 DIAGNOSIS — I779 Disorder of arteries and arterioles, unspecified: Secondary | ICD-10-CM | POA: Diagnosis not present

## 2020-12-07 DIAGNOSIS — I10 Essential (primary) hypertension: Secondary | ICD-10-CM | POA: Diagnosis not present

## 2020-12-07 DIAGNOSIS — J329 Chronic sinusitis, unspecified: Secondary | ICD-10-CM | POA: Diagnosis not present

## 2020-12-11 DIAGNOSIS — B078 Other viral warts: Secondary | ICD-10-CM | POA: Diagnosis not present

## 2020-12-11 DIAGNOSIS — Z1283 Encounter for screening for malignant neoplasm of skin: Secondary | ICD-10-CM | POA: Diagnosis not present

## 2020-12-11 DIAGNOSIS — L821 Other seborrheic keratosis: Secondary | ICD-10-CM | POA: Diagnosis not present

## 2020-12-11 DIAGNOSIS — D485 Neoplasm of uncertain behavior of skin: Secondary | ICD-10-CM | POA: Diagnosis not present

## 2020-12-11 DIAGNOSIS — L570 Actinic keratosis: Secondary | ICD-10-CM | POA: Diagnosis not present

## 2020-12-11 DIAGNOSIS — Z85828 Personal history of other malignant neoplasm of skin: Secondary | ICD-10-CM | POA: Diagnosis not present

## 2020-12-21 DIAGNOSIS — B078 Other viral warts: Secondary | ICD-10-CM | POA: Diagnosis not present

## 2020-12-21 DIAGNOSIS — D485 Neoplasm of uncertain behavior of skin: Secondary | ICD-10-CM | POA: Diagnosis not present

## 2020-12-26 ENCOUNTER — Other Ambulatory Visit: Payer: Self-pay | Admitting: Cardiology

## 2021-01-25 ENCOUNTER — Other Ambulatory Visit: Payer: Self-pay

## 2021-01-25 ENCOUNTER — Encounter: Payer: Self-pay | Admitting: Cardiology

## 2021-01-25 ENCOUNTER — Ambulatory Visit (INDEPENDENT_AMBULATORY_CARE_PROVIDER_SITE_OTHER): Payer: Medicare Other | Admitting: Cardiology

## 2021-01-25 VITALS — BP 138/74 | HR 68 | Ht 67.0 in | Wt 150.0 lb

## 2021-01-25 DIAGNOSIS — I10 Essential (primary) hypertension: Secondary | ICD-10-CM | POA: Diagnosis not present

## 2021-01-25 DIAGNOSIS — I480 Paroxysmal atrial fibrillation: Secondary | ICD-10-CM | POA: Diagnosis not present

## 2021-01-25 DIAGNOSIS — I340 Nonrheumatic mitral (valve) insufficiency: Secondary | ICD-10-CM | POA: Diagnosis not present

## 2021-01-25 NOTE — Progress Notes (Signed)
Clinical Summary Lydia Graham is a 85 y.o.female former patient of Dr Bronson Ing, this is our first visit together. Seen for the following medical problems.   1.Afib - followed by EP Dr Rayann Heman - has been on amio '100mg'$  qod  - no significant palpitations - normal thyroid, normal LFTs - no bleeding on eliquis   2. HTN - home bp's typically 110s-130s/50s-70s   3. Bilateral LE edema - 10/2017 LVEF 50%, cannot eval diasotlic function - no recent issues, doing well with compression stockings  4. Carotid stenosis - prior left CEA - followed by vascular  5. Mitral regurgitation - moderate by echo 2019   Past Medical History:  Diagnosis Date   Arthritis    oa, all over - multiple areas    Carotid artery occlusion    Dysrhythmia    AFib    GERD (gastroesophageal reflux disease)    Headache    h/o migraines    Hypertension      Allergies  Allergen Reactions   Rosuvastatin Other (See Comments)    MYALGIA, Muscle pain   Lisinopril Cough   Other Other (See Comments)    PAIN MEDICATIONS/ANESTHESIA  MADE BP DROP LOW    Singulair [Montelukast Sodium] Other (See Comments)    fatigue     Current Outpatient Medications  Medication Sig Dispense Refill   amiodarone (PACERONE) 200 MG tablet TAKE 1/2 TABLET EVERY OTHER DAY. 25 tablet 3   amLODipine (NORVASC) 5 MG tablet TAKE 1 TABLET AT 8AM AND 1 TABLET AT 8PM. 180 tablet 2   apixaban (ELIQUIS) 5 MG TABS tablet TAKE (1) TABLET TWICE DAILY. 180 tablet 1   Calcium Carbonate-Vitamin D (CALCIUM 600+D PO) Take 1 tablet by mouth daily.     estradiol (ESTRACE) 0.5 MG tablet Take 0.5 mg by mouth daily.      fluticasone (FLONASE) 50 MCG/ACT nasal spray Place 2 sprays into both nostrils daily as needed for allergies.      furosemide (LASIX) 20 MG tablet Take 1 tablet (20 mg total) by mouth daily as needed for edema (leg swelling / shortness of breath). 30 tablet 6   Hypromellose (ARTIFICIAL TEARS OP) Place 2 drops into both eyes 2  (two) times daily as needed (for dry eyes).     losartan (COZAAR) 50 MG tablet TAKE 1 TABLET AT 10 AM AND 1 TABLET AT 10 PM. 60 tablet 3   magnesium hydroxide (MILK OF MAGNESIA) 400 MG/5ML suspension Take 30 mLs by mouth daily as needed for mild constipation.     metoprolol succinate (TOPROL-XL) 50 MG 24 hr tablet TAKE 1 TABLET AT LUNCH OR IMMEDIATELY FOLLOWING A MEAL. 30 tablet 11   pantoprazole (PROTONIX) 40 MG tablet Take 1 tablet (40 mg total) by mouth 2 (two) times daily. 60 tablet 6   potassium chloride SA (K-DUR,KLOR-CON) 20 MEQ tablet Take 1 tablet (20 mEq total) by mouth as needed (take on days you take your Furosemide).     vitamin B-12 (CYANOCOBALAMIN) 1000 MCG tablet Take 1,000 mcg by mouth daily.     No current facility-administered medications for this visit.     Past Surgical History:  Procedure Laterality Date   ABDOMINAL HYSTERECTOMY     APPENDECTOMY     BACK SURGERY     CARDIOVERSION N/A 12/19/2017   Procedure: CARDIOVERSION;  Surgeon: Arnoldo Lenis, MD;  [facility];  Service: Endoscopy;  Laterality: N/A;   CARDIOVERSION N/A 12/26/2017   Procedure: CARDIOVERSION;  Surgeon: Debara Pickett,  Nadean Corwin, MD;  Location: [facility] ENDOSCOPY;  Service: Cardiovascular;  Laterality: N/A;   CARDIOVERSION N/A 01/26/2018   Procedure: CARDIOVERSION;  Surgeon: Jerline Pain, MD;  Location: [facility] ENDOSCOPY;  Service: Cardiovascular;  Laterality: N/A;   CAROTID ENDARTERECTOMY     ENDARTERECTOMY Left 03/27/2017   Procedure: ENDARTERECTOMY CAROTID LEFT;  Surgeon: [name], MD;  Location: [place];  Service: Vascular;  Laterality: Left;   INNER EAR SURGERY Right    puntured eardrum- repaired 2x's    KNEE ARTHROPLASTY     NASAL SINUS SURGERY     deviated septum   PATCH ANGIOPLASTY Left 03/27/2017   Procedure: PATCH ANGIOPLASTY LEFT CAROTID ARTERY USING Rueben Bash BIOLOGIC PATCH;  Surgeon: [name], MD;  Location: [place];  Service: Vascular;  Laterality: Left;    TONSILLECTOMY     TOTAL KNEE ARTHROPLASTY Right 10/23/2016   Procedure: RIGHT TOTAL KNEE ARTHROPLASTY;  Surgeon: [name], MD;  Location: [place];  Service: Orthopedics;  Laterality: Right;     Allergies  Allergen Reactions   Rosuvastatin Other (See Comments)    MYALGIA, Muscle pain   Lisinopril Cough   Other Other (See Comments)    PAIN MEDICATIONS/ANESTHESIA  MADE BP DROP LOW    Singulair [Montelukast Sodium] Other (See Comments)    fatigue      Family History  Problem Relation Age of Onset   Stroke Mother    Rheum arthritis Sister      Social History Lydia Graham reports that she has never smoked. She has never used smokeless tobacco. Lydia Graham reports no history of alcohol use.   Review of Systems CONSTITUTIONAL: No weight loss, fever, chills, weakness or fatigue.  HEENT: Eyes: No visual loss, blurred vision, double vision or yellow sclerae.No hearing loss, sneezing, congestion, runny nose or sore throat.  SKIN: No rash or itching.  CARDIOVASCULAR: per hpi RESPIRATORY: No shortness of breath, cough or sputum.  GASTROINTESTINAL: No anorexia, nausea, vomiting or diarrhea. No abdominal pain or blood.  GENITOURINARY: No burning on urination, no polyuria NEUROLOGICAL: No headache, dizziness, syncope, paralysis, ataxia, numbness or tingling in the extremities. No change in bowel or bladder control.  MUSCULOSKELETAL: No muscle, back pain, joint pain or stiffness.  LYMPHATICS: No enlarged nodes. No history of splenectomy.  PSYCHIATRIC: No history of depression or anxiety.  ENDOCRINOLOGIC: No reports of sweating, cold or heat intolerance. No polyuria or polydipsia.  Marland Kitchen   Physical Examination Today's Vitals   01/25/21 1012  BP: 138/74  Pulse: 68  SpO2: 98%  Weight: 150 lb (68 kg)  Height: '5\' 7"'$  (1.702 m)   Body mass index is 23.49 kg/m.   Gen: resting comfortably, no acute distress HEENT: no scleral icterus, pupils equal round and reactive, no palptable  cervical adenopathy,  CV: RRR, no m/r/g, no jvd Resp: Clear to auscultation bilaterally GI: abdomen is soft, non-tender, non-distended, normal bowel sounds, no hepatosplenomegaly MSK: extremities are warm, no edema.  Skin: warm, no rash Neuro:  no focal deficits Psych: appropriate affect   Diagnostic Studies     Assessment and Plan  Afib - doing well without symptoms, continue current meds including anticoagulatin  2. HTN - home numbers at goal, continue current meds  3. LE edema - controlled with compression stockings and prn lasix, continue.   4. Mitral regurgitation - no symptoms, moderate by last [place] - repeat study for surveillance    Arnoldo Lenis, M.D.

## 2021-01-25 NOTE — Patient Instructions (Signed)

## 2021-01-29 DIAGNOSIS — I10 Essential (primary) hypertension: Secondary | ICD-10-CM | POA: Diagnosis not present

## 2021-01-29 DIAGNOSIS — R21 Rash and other nonspecific skin eruption: Secondary | ICD-10-CM | POA: Diagnosis not present

## 2021-01-29 DIAGNOSIS — D6869 Other thrombophilia: Secondary | ICD-10-CM | POA: Diagnosis not present

## 2021-01-29 DIAGNOSIS — Z299 Encounter for prophylactic measures, unspecified: Secondary | ICD-10-CM | POA: Diagnosis not present

## 2021-01-29 DIAGNOSIS — I4891 Unspecified atrial fibrillation: Secondary | ICD-10-CM | POA: Diagnosis not present

## 2021-02-01 DIAGNOSIS — H25813 Combined forms of age-related cataract, bilateral: Secondary | ICD-10-CM | POA: Diagnosis not present

## 2021-02-01 DIAGNOSIS — H01004 Unspecified blepharitis left upper eyelid: Secondary | ICD-10-CM | POA: Diagnosis not present

## 2021-02-01 DIAGNOSIS — H01001 Unspecified blepharitis right upper eyelid: Secondary | ICD-10-CM | POA: Diagnosis not present

## 2021-02-01 DIAGNOSIS — H01002 Unspecified blepharitis right lower eyelid: Secondary | ICD-10-CM | POA: Diagnosis not present

## 2021-02-22 DIAGNOSIS — M79676 Pain in unspecified toe(s): Secondary | ICD-10-CM | POA: Diagnosis not present

## 2021-02-22 DIAGNOSIS — B351 Tinea unguium: Secondary | ICD-10-CM | POA: Diagnosis not present

## 2021-03-01 DIAGNOSIS — H25811 Combined forms of age-related cataract, right eye: Secondary | ICD-10-CM | POA: Diagnosis not present

## 2021-03-05 ENCOUNTER — Encounter (HOSPITAL_COMMUNITY): Payer: Self-pay

## 2021-03-05 ENCOUNTER — Encounter (HOSPITAL_COMMUNITY)
Admission: RE | Admit: 2021-03-05 | Discharge: 2021-03-05 | Disposition: A | Discharge status: Home / Self Care | Payer: Medicare Other | Source: Ambulatory Visit | Attending: Ophthalmology | Admitting: Ophthalmology

## 2021-03-05 ENCOUNTER — Other Ambulatory Visit: Payer: Self-pay

## 2021-03-06 NOTE — H&P (Signed)
Surgical History & Physical  Patient Name: Lydia Graham DOB: 12-07-1935  Surgery: Cataract extraction with intraocular lens implant phacoemulsification; Right Eye  Surgeon: [name] MD Surgery Date:  03-09-2021 Pre-Op Date:  03-01-2021  HPI: A 48 Yr. old female patient Pt referred by Dr. Hassell Done for cataract evaluation. The patient complains of nighttime light - car headlights, street lamps etc. glare causing poor vision, which began 4+ years ago. Both eyes are affected. OD>OS. The episode is gradual. The condition's severity is worsening. The complaint is associated with blurry vision, glares and halos. Pt has difficulty with near vision. Unable to complete task at near as she previously has. Symptoms are negatively affecting pt's quality of life. Pt using AT's prn OU. Pt denies any increase in floater /flashes of light. HPI Completed by Dr. [name]  Medical History: Dry Eyes Cataracts Retinal Tear w/o detachment OS (2015) Macula Degeneration Glaucoma Acid Reflux Arthritis Heart Problem High Blood Pressure  Review of Systems Negative Allergic/Immunologic Negative Cardiovascular Negative Constitutional Negative Ear, Nose, Mouth & Throat Negative Endocrine Negative Eyes Negative Gastrointestinal Negative Genitourinary Negative Hemotologic/Lymphatic Negative Integumentary Negative Musculoskeletal Negative Neurological Negative Psychiatry Negative Respiratory  Social   Never smoked   Medication Artificial Tears, Amlodipine, Vitamin B-12, Eliquis, Losartan, Magnesium, Metoprolol, Amiodarone, Estradiol  Sx/Procedures Laser therapy for retinal lesion, Appendectomy, Hysterectomy, Attery occlusion, Knee Surgery, Back Surgery  Drug Allergies   NKDA  History & Physical: Heent: Cataract, Right Eye NECK: supple without bruits LUNGS: lungs clear to auscultation CV: regular rate and rhythm Abdomen: soft and non-tender  Impression & Plan: Assessment: 1.   COMBINED FORMS AGE RELATED CATARACT; Both Eyes (H25.813) 2.  BLEPHARITIS; Right Upper Lid, Right Lower Lid, Left Upper Lid, Left Lower Lid (H01.001, H01.002,H01.004,H01.005) 3.  CONJUNCTIVOCHALASIS; Both Eyes (H11.823) 4.  Pinguecula; Both Eyes (H11.153) 5.  ARCUS SENILIS; Both Eyes (H18.413) 6.  VITREOUS DETACHMENT PVD; Left Eye (H43.812) 7.  DERMATOCHALASIS; Right Upper Lid, Left Upper Lid (H02.831, H02.834) 8.  ASTIGMATISM, REGULAR; Both Eyes (H52.223)  Plan: 1.  Cataract accounts for the patient's decreased vision. This visual impairment is not correctable with a tolerable change in glasses or contact lenses. Cataract surgery with an implantation of a new lens should significantly improve the visual and functional status of the patient. Discussed all risks, benefits, alternatives, and potential complications. Discussed the procedures and recovery. Patient desires to have surgery. A-scan ordered and performed today for intra-ocular lens calculations. The surgery will be performed in order to improve vision for driving, reading, and for eye examinations. Recommend phacoemulsification with intra-ocular lens. Recommend Dextenza for post-operative pain and inflammation. Right Eye worse - first. Dilates well - lidophen by protocol. Toric Lens.  2.  Recommend regular lid cleaning.  3.  Asymptomatic. Symptomatic.  4.  Observe; Artificial tears as needed for irritation.  5.  Discussed significance of finding  6.  Old Asymptomatic. RD precautions given. Patient to call with increase in flashing lights/floaters/dark curtain. Symptomatic.  7.  Asymptomatic, recommend observation for now. Findings, prognosis and treatment options reviewed.  8.  Recommend Toric IOL.

## 2021-03-09 ENCOUNTER — Encounter (HOSPITAL_COMMUNITY): Payer: Self-pay | Admitting: Ophthalmology

## 2021-03-09 ENCOUNTER — Ambulatory Visit (HOSPITAL_COMMUNITY)
Admission: RE | Admit: 2021-03-09 | Discharge: 2021-03-09 | Disposition: A | Discharge status: Home / Self Care | Payer: Medicare Other | Attending: Ophthalmology | Admitting: Ophthalmology

## 2021-03-09 ENCOUNTER — Ambulatory Visit (HOSPITAL_COMMUNITY): Payer: Medicare Other | Admitting: Anesthesiology

## 2021-03-09 ENCOUNTER — Encounter (HOSPITAL_COMMUNITY)
Admission: RE | Disposition: A | Discharge status: Home / Self Care | Payer: Self-pay | Source: Home / Self Care | Attending: Ophthalmology

## 2021-03-09 DIAGNOSIS — Z79899 Other long term (current) drug therapy: Secondary | ICD-10-CM | POA: Insufficient documentation

## 2021-03-09 DIAGNOSIS — H02831 Dermatochalasis of right upper eyelid: Secondary | ICD-10-CM | POA: Insufficient documentation

## 2021-03-09 DIAGNOSIS — Z7901 Long term (current) use of anticoagulants: Secondary | ICD-10-CM | POA: Insufficient documentation

## 2021-03-09 DIAGNOSIS — H52223 Regular astigmatism, bilateral: Secondary | ICD-10-CM | POA: Diagnosis not present

## 2021-03-09 DIAGNOSIS — H0100B Unspecified blepharitis left eye, upper and lower eyelids: Secondary | ICD-10-CM | POA: Insufficient documentation

## 2021-03-09 DIAGNOSIS — H02834 Dermatochalasis of left upper eyelid: Secondary | ICD-10-CM | POA: Insufficient documentation

## 2021-03-09 DIAGNOSIS — H11153 Pinguecula, bilateral: Secondary | ICD-10-CM | POA: Diagnosis not present

## 2021-03-09 DIAGNOSIS — H18413 Arcus senilis, bilateral: Secondary | ICD-10-CM | POA: Diagnosis not present

## 2021-03-09 DIAGNOSIS — H25811 Combined forms of age-related cataract, right eye: Secondary | ICD-10-CM | POA: Diagnosis not present

## 2021-03-09 DIAGNOSIS — H25813 Combined forms of age-related cataract, bilateral: Secondary | ICD-10-CM | POA: Insufficient documentation

## 2021-03-09 DIAGNOSIS — I4891 Unspecified atrial fibrillation: Secondary | ICD-10-CM | POA: Diagnosis not present

## 2021-03-09 DIAGNOSIS — Z79890 Hormone replacement therapy (postmenopausal): Secondary | ICD-10-CM | POA: Insufficient documentation

## 2021-03-09 DIAGNOSIS — H43812 Vitreous degeneration, left eye: Secondary | ICD-10-CM | POA: Diagnosis not present

## 2021-03-09 DIAGNOSIS — H5711 Ocular pain, right eye: Secondary | ICD-10-CM | POA: Insufficient documentation

## 2021-03-09 DIAGNOSIS — H11823 Conjunctivochalasis, bilateral: Secondary | ICD-10-CM | POA: Diagnosis not present

## 2021-03-09 DIAGNOSIS — H52201 Unspecified astigmatism, right eye: Secondary | ICD-10-CM | POA: Insufficient documentation

## 2021-03-09 DIAGNOSIS — H0100A Unspecified blepharitis right eye, upper and lower eyelids: Secondary | ICD-10-CM | POA: Insufficient documentation

## 2021-03-09 HISTORY — PX: CATARACT EXTRACTION W/PHACO: SHX586

## 2021-03-09 SURGERY — PHACOEMULSIFICATION, CATARACT, WITH IOL INSERTION
Anesthesia: Monitor Anesthesia Care | Site: Eye | Laterality: Right

## 2021-03-09 MED ORDER — EPINEPHRINE PF 1 MG/ML IJ SOLN
INTRAOCULAR | Status: DC | PRN
  Administered 2021-03-09: 500 mL

## 2021-03-09 MED ORDER — TROPICAMIDE 1 % OP SOLN
1.0000 [drp] | OPHTHALMIC | Status: AC
  Administered 2021-03-09 (×3): 1 [drp] via OPHTHALMIC

## 2021-03-09 MED ORDER — BSS IO SOLN
INTRAOCULAR | Status: DC | PRN
  Administered 2021-03-09: 15 mL via INTRAOCULAR

## 2021-03-09 MED ORDER — LACTATED RINGERS IV SOLN: INTRAVENOUS | Status: DC | PRN

## 2021-03-09 MED ORDER — EPINEPHRINE PF 1 MG/ML IJ SOLN
INTRAMUSCULAR | Status: AC
  Filled 2021-03-09: qty 2

## 2021-03-09 MED ORDER — LIDOCAINE HCL (PF) 1 % IJ SOLN
INTRAOCULAR | Status: DC | PRN
  Administered 2021-03-09: 1 mL via OPHTHALMIC

## 2021-03-09 MED ORDER — TETRACAINE HCL 0.5 % OP SOLN
1.0000 [drp] | OPHTHALMIC | Status: AC | PRN
  Administered 2021-03-09 (×3): 1 [drp] via OPHTHALMIC

## 2021-03-09 MED ORDER — DEXAMETHASONE 0.4 MG OP INST
VAGINAL_INSERT | OPHTHALMIC | Status: DC | PRN
  Administered 2021-03-09: 0.4 mg via OPHTHALMIC

## 2021-03-09 MED ORDER — DEXAMETHASONE 0.4 MG OP INST
VAGINAL_INSERT | OPHTHALMIC | Status: AC
  Filled 2021-03-09: qty 1

## 2021-03-09 MED ORDER — EPHEDRINE 5 MG/ML INJ
INTRAVENOUS | Status: AC
  Filled 2021-03-09: qty 5

## 2021-03-09 MED ORDER — SODIUM HYALURONATE 10 MG/ML IO SOLUTION
PREFILLED_SYRINGE | INTRAOCULAR | Status: DC | PRN
  Administered 2021-03-09: 0.85 mL via INTRAOCULAR

## 2021-03-09 MED ORDER — STERILE WATER FOR IRRIGATION IR SOLN
Status: DC | PRN
  Administered 2021-03-09: 250 mL

## 2021-03-09 MED ORDER — SODIUM HYALURONATE 23MG/ML IO SOSY
PREFILLED_SYRINGE | INTRAOCULAR | Status: DC | PRN
  Administered 2021-03-09: 0.6 mL via INTRAOCULAR

## 2021-03-09 MED ORDER — PHENYLEPHRINE HCL 2.5 % OP SOLN
1.0000 [drp] | OPHTHALMIC | Status: AC | PRN
  Administered 2021-03-09 (×3): 1 [drp] via OPHTHALMIC

## 2021-03-09 MED ORDER — POVIDONE-IODINE 5 % OP SOLN
OPHTHALMIC | Status: DC | PRN
  Administered 2021-03-09: 1 via OPHTHALMIC

## 2021-03-09 MED ORDER — LIDOCAINE HCL 3.5 % OP GEL
1.0000 "application " | Freq: Once | OPHTHALMIC | Status: AC
  Administered 2021-03-09: 1 via OPHTHALMIC

## 2021-03-09 SURGICAL SUPPLY — 14 items
CLOTH BEACON ORANGE TIMEOUT ST (SAFETY) ×1 IMPLANT
EYE SHIELD UNIVERSAL CLEAR (GAUZE/BANDAGES/DRESSINGS) ×1 IMPLANT
GLOVE SURG UNDER POLY LF SZ6.5 (GLOVE) ×1 IMPLANT
GLOVE SURG UNDER POLY LF SZ7 (GLOVE) ×1 IMPLANT
NDL HYPO 18GX1.5 BLUNT FILL (NEEDLE) IMPLANT
NEEDLE HYPO 18GX1.5 BLUNT FILL (NEEDLE) ×2 IMPLANT
PAD ARMBOARD 7.5X6 YLW CONV (MISCELLANEOUS) ×1 IMPLANT
PROC W SPEC LENS (INTRAOCULAR LENS)
PROCESS W SPEC LENS (INTRAOCULAR LENS) IMPLANT
SYR TB 1ML LL NO SAFETY (SYRINGE) ×1 IMPLANT
TAPE SURG TRANSPORE 1 IN (GAUZE/BANDAGES/DRESSINGS) IMPLANT
TAPE SURGICAL TRANSPORE 1 IN (GAUZE/BANDAGES/DRESSINGS) ×2
TECNIS EYHANCETORIC II  IOL (Intraocular Lens) ×1 IMPLANT
WATER STERILE IRR 250ML POUR (IV SOLUTION) ×1 IMPLANT

## 2021-03-09 NOTE — Interval H&P Note (Signed)
History and Physical Interval Note:  03/09/2021 8:29 AM  [name]  has presented today for surgery, with the diagnosis of Nuclear sclerotic cataract - Right eye.  The various methods of treatment have been discussed with the patient and family. After consideration of risks, benefits and other options for treatment, the patient has consented to  Procedure(s) with comments: CATARACT EXTRACTION PHACO AND INTRAOCULAR LENS PLACEMENT (IOC) (Right) - right as a surgical intervention.  The patient's history has been reviewed, patient examined, no change in status, stable for surgery.  I have reviewed the patient's chart and labs.  Questions were answered to the patient's satisfaction.     [name]

## 2021-03-09 NOTE — Progress Notes (Signed)
Patient's BP was 168/65 upon arrival to pre-op.  After sitting for a while, her BP dropped to 56/38.  The BP was placed on the opposite arm and rechecked.  The BP was then 54/35 and the pt reported feeling nauseous.  The patient was laid flat in the chair and anesthesia was notified.  Another BP was checked and the BP was 89/49.  Anesthesia requested that she be given a 500-mL bolus of LR.  Her BP continued to increase and the second check after the bolus started was 129/55.  Patient was no longer dizzy.  Patient calm and resting in the chair.  Will continue to monitor the patient.

## 2021-03-09 NOTE — Transfer of Care (Signed)
Immediate Anesthesia Transfer of Care Note  Patient: [name]  Procedure(s) Performed: CATARACT EXTRACTION PHACO AND INTRAOCULAR LENS PLACEMENT with Placement of Corticosteroid (IOC) (Right: Eye)  Patient Location: PACU  Anesthesia Type:MAC  Level of Consciousness: awake  Airway & Oxygen Therapy: Patient Spontanous Breathing  Post-op Assessment: Report given to RN and Post -op Vital signs reviewed and stable  Post vital signs: Reviewed and stable  Last Vitals:  Vitals Value Taken Time  BP    Temp    Pulse    Resp    SpO2      Last Pain:  Vitals:   03/09/21 0647  TempSrc: Oral  PainSc: 0-No pain      Patients Stated Pain Goal: 5 (62/44/69 5072)  Complications: No notable events documented.

## 2021-03-09 NOTE — Anesthesia Procedure Notes (Signed)
Procedure Name: MAC Date/Time: 03/09/2021 8:36 AM Performed by: Lieutenant Diego, CRNA Pre-anesthesia Checklist: Patient identified, Emergency Drugs available, Suction available, Patient being monitored and Timeout performed Patient Re-evaluated:Patient Re-evaluated prior to induction Oxygen Delivery Method: Nasal cannula Preoxygenation: Pre-oxygenation with 100% oxygen

## 2021-03-09 NOTE — Anesthesia Preprocedure Evaluation (Addendum)
Anesthesia Evaluation  Patient identified by MRN, date of birth, ID band Patient awake    Reviewed: Allergy & Precautions, NPO status , Patient's Chart, lab work & pertinent test results, reviewed documented beta blocker date and time   History of Anesthesia Complications Negative for: history of anesthetic complications  Airway Mallampati: II  TM Distance: >3 FB Neck ROM: Full    Dental  (+) Dental Advisory Given, Teeth Intact   Pulmonary neg pulmonary ROS,    Pulmonary exam normal breath sounds clear to auscultation       Cardiovascular hypertension, Pt. on medications and Pt. on home beta blockers + Peripheral Vascular Disease (carotid endarterectomy) and +CHF  + dysrhythmias (on eliquis) Atrial Fibrillation  Rhythm:Regular Rate:Bradycardia     Neuro/Psych  Headaches, negative psych ROS   GI/Hepatic Neg liver ROS, GERD  Medicated and Controlled,  Endo/Other  negative endocrine ROS  Renal/GU negative Renal ROS  negative genitourinary   Musculoskeletal  (+) Arthritis , Osteoarthritis,    Abdominal   Peds negative pediatric ROS (+)  Hematology negative hematology ROS (+)   Anesthesia Other Findings Patient had hypotensive episode, appears to be from vasovagal reaction from adjusting left arm IV catheter. After hypotensive episode, nursing staff put patient head down, IV fluids were given, blood pressure back to baseline. No EKG changes in 3 lead. Proceed with surgery under MAC.   Reproductive/Obstetrics                            Anesthesia Physical Anesthesia Plan  ASA: 3  Anesthesia Plan: MAC   Post-op Pain Management:    Induction:   PONV Risk Score and Plan:   Airway Management Planned: Natural Airway and Nasal Cannula  Additional Equipment:   Intra-op Plan:   Post-operative Plan:   Informed Consent: I have reviewed the patients History and Physical, chart, labs and  discussed the procedure including the risks, benefits and alternatives for the proposed anesthesia with the patient or authorized representative who has indicated his/her understanding and acceptance.     Dental advisory given  Plan Discussed with: CRNA and Surgeon  Anesthesia Plan Comments:         Anesthesia Quick Evaluation

## 2021-03-09 NOTE — Op Note (Signed)
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 Galo Sayed, MD, MA  Anesthesia: MAC, Topical Akten  Complications: None  Estimated Blood Loss: <67m (minimal)  Specimens: None  Implants: As above  Indications:  Visually significant age-related cataract, Right Eye; Visually Significant Astigmatism, Right Eye  Procedure:  The patient was seen and identified in the pre-operative area. The operative eye was identified and dilated.  The operative eye was marked.  Pre-operative toric markers were used to mark the eye at 0 and 180 degrees. Topical anesthesia was administered to the operative eye.     The patient was then to the operative suite and placed in the supine position.  A timeout was performed confirming the patient, procedure to be performed, and all other relevant information.   The patient's face was prepped and draped in the usual fashion for intra-ocular surgery.  A lid speculum was placed into the operative eye and the surgical microscope moved into place and focused.  A superotemporal paracentesis was created using a 20 gauge paracentesis blade.  Shugarcaine was injected into the anterior chamber.  Viscoelastic was injected into the anterior chamber.  A temporal clear-corneal main wound incision was created using a 2.493mmicrokeratome.  A continuous curvilinear capsulorrhexis was initiated using an irrigating cystitome and completed using capsulorrhexis forceps.   Hydrodissection and hydrodeliniation were performed.  Viscoelastic was injected into the anterior chamber.  A phacoemulsification handpiece and a chopper as a second instrument were used to remove the nucleus and epinucleus. The irrigation/aspiration handpiece was used to remove any remaining cortical material.   The capsular bag was reinflated with viscoelastic, checked, and found to be intact.  The intraocular lens was inserted into the capsular bag and dialed into place using a Kuglen hook to 6 degrees.  The irrigation/aspiration handpiece was used to remove any remaining viscoelastic.  The clear corneal wound and paracentesis wounds were then hydrated and checked with Weck-Cels to be watertight.  The lid-speculum was removed.  The lower punctum was dilated and filled with Provisc. A Dextenza implant was placed in the lower canaliculus without complication.  The drape was removed, and the patient's face was cleaned with a wet and dry 4x4.  A clear shield was taped over the eye. The patient was taken to the post-operative care unit in good condition, having tolerated the procedure well.  Post-Op Instructions: The patient will follow up at [facility] a same day post-operative evaluation and will receive all other orders and instructions.

## 2021-03-09 NOTE — Anesthesia Postprocedure Evaluation (Addendum)
Anesthesia Post Note  Patient: [name]  Procedure(s) Performed: CATARACT EXTRACTION PHACO AND INTRAOCULAR LENS PLACEMENT with Placement of Corticosteroid (IOC) (Right: Eye)  Patient location during evaluation: Phase II Anesthesia Type: MAC Level of consciousness: awake and alert and oriented Pain management: pain level controlled Vital Signs Assessment: post-procedure vital signs reviewed and stable Respiratory status: spontaneous breathing and respiratory function stable Cardiovascular status: blood pressure returned to baseline and stable Postop Assessment: no apparent nausea or vomiting Anesthetic complications: no Comments: Patient had hypotensive episode in preop, appears to be from vasovagal reaction from adjusting left arm IV catheter. After hypotensive episode, nursing staff put patient head down, IV fluids given, blood pressure back to baseline. No EKG changes in 3 lead. Proceeded with surgery under MAC. Patient stable during intra op and postop.   No notable events documented.   Last Vitals:  Vitals:   03/09/21 0745 03/09/21 0857  BP: (!) 129/55 (!) 155/65  Pulse:  (!) 53  Resp:  18  Temp:  36.4 C  SpO2:  100%    Last Pain:  Vitals:   03/09/21 0901  TempSrc:   PainSc: 0-No pain                 Sherolyn Trettin C Lashondra Vaquerano

## 2021-03-09 NOTE — Discharge Instructions (Addendum)
Please discharge patient when stable, will follow up today with Dr. Wrzosek at the [facility] office immediately following discharge.  Leave shield in place until visit.  All paperwork with discharge instructions will be given at the office.  [facility] Address:  [address]  [place], [place] [address]  

## 2021-03-12 ENCOUNTER — Encounter (HOSPITAL_COMMUNITY): Payer: Self-pay | Admitting: Ophthalmology

## 2021-03-20 ENCOUNTER — Encounter (HOSPITAL_COMMUNITY)
Admission: RE | Admit: 2021-03-20 | Discharge: 2021-03-20 | Disposition: A | Discharge status: Home / Self Care | Payer: Medicare Other | Source: Ambulatory Visit | Attending: Ophthalmology | Admitting: Ophthalmology

## 2021-03-20 ENCOUNTER — Encounter (HOSPITAL_COMMUNITY): Payer: Self-pay

## 2021-03-20 ENCOUNTER — Other Ambulatory Visit: Payer: Self-pay

## 2021-03-20 NOTE — Pre-Procedure Instructions (Signed)
Left voicemail on home and cell phones for her to call back about her preop.

## 2021-03-20 NOTE — H&P (Addendum)
Surgical History & Physical  Patient Name: Lydia Graham DOB: 1935/08/30  Surgery: Cataract extraction with intraocular lens implant phacoemulsification; Left Eye  Surgeon: [name] MD Surgery Date:  04-02-2021 Pre-Op Date:  03-15-2021  HPI: A 26 Yr. old female patient The patient is returning after cataract surgery. The right eye is affected. Status post cataract surgery, which began 1 week ago: Since the last visit, the affected area is doing well. The patient's vision is improved and stable. Patient is following medication instructions. Pt taking PO combo drops TID OD. Pt denies any eye pain or increase in floaters. The patient complains of nighttime light - car headlights, street lamps etc. glare causing poor vision, which began 4+ years ago. The left eye is affected. The episode is gradual. The condition's severity is worsening. The complaint is associated with blurry vision, glares and halos. Pt has difficulty with near vision. Unable to complete task at near as she previously has. Symptoms are negatively affecting pt's quality of life. HPI Completed by Dr. [name]  Medical History: Dry Eyes Cataracts Retinal Tear w/o detachment OS (2015) Macula Degeneration Glaucoma Acid Reflux Arthritis Heart Problem High Blood Pressure  Review of Systems Negative Allergic/Immunologic Negative Cardiovascular Negative Constitutional Negative Ear, Nose, Mouth & Throat Negative Endocrine Negative Eyes Negative Gastrointestinal Negative Genitourinary Negative Hemotologic/Lymphatic Negative Integumentary Negative Musculoskeletal Negative Neurological Negative Psychiatry Negative Respiratory  Social   Never smoked   Medication Artificial Tears, Prednisolone-Moxifloxacin-Bromfenac, Amlodipine, Vitamin B-12, Eliquis, Losartan, Magnesium, Metoprolol, Amiodarone, Estradiol,   Sx/Procedures Laser therapy for retinal lesion, Phaco c IOL OD-Toric with Dextenza,  Appendectomy,  Hysterectomy, Attery occlusion, Knee Surgery, Back Surgery,   Drug Allergies   NKDA  History & Physical: Heent: Cataract, Left Eye NECK: supple without bruits LUNGS: lungs clear to auscultation CV: regular rate and rhythm Abdomen: soft and non-tender  Impression & Plan: Assessment: 1.  CATARACT EXTRACTION STATUS; Right Eye (Z98.41) 2.  INTRAOCULAR LENS IOL (Z96.1) 3.  COMBINED FORMS AGE RELATED CATARACT; Left Eye (H25.812) 4.  ASTIGMATISM, REGULAR; Both Eyes (H52.223)  Plan: 1.  1 week after cataract surgery. Doing well with improved vision and normal eye pressure. Call with any problems or concerns. Stop Drops - Dextenza.  2.  Doing well since surgery Continue Post-op medications  3.  Cataract accounts for the patient's decreased vision. This visual impairment is not correctable with a tolerable change in glasses or contact lenses. Cataract surgery with an implantation of a new lens should significantly improve the visual and functional status of the patient. Discussed all risks, benefits, alternatives, and potential complications. Discussed the procedures and recovery. Patient desires to have surgery. A-scan ordered and performed today for intra-ocular lens calculations. The surgery will be performed in order to improve vision for driving, reading, and for eye examinations. Recommend phacoemulsification with intra-ocular lens. Recommend Dextenza for post-operative pain and inflammation. Left Eye. Surgery required to correct imbalance of vision. Dilates well - shugarcaine by protocol. Toric Lens.  4.  Recommend Toric IOL.

## 2021-03-21 MED ORDER — TROPICAMIDE 1 % OP SOLN: 1.0000 [drp] | OPHTHALMIC | Status: AC

## 2021-03-22 ENCOUNTER — Encounter (HOSPITAL_COMMUNITY): Payer: Self-pay | Admitting: Anesthesiology

## 2021-03-22 ENCOUNTER — Ambulatory Visit (INDEPENDENT_AMBULATORY_CARE_PROVIDER_SITE_OTHER): Payer: Medicare Other

## 2021-03-22 DIAGNOSIS — I340 Nonrheumatic mitral (valve) insufficiency: Secondary | ICD-10-CM

## 2021-03-22 LAB — ECHOCARDIOGRAM COMPLETE
Calc EF: 65.7 %
S' Lateral: 2.69 cm
Single Plane A2C EF: 52.7 %
Single Plane A4C EF: 72.8 %

## 2021-03-23 MED ORDER — EPINEPHRINE PF 1 MG/ML IJ SOLN
INTRAMUSCULAR | Status: AC
  Filled 2021-03-23: qty 2

## 2021-03-26 DIAGNOSIS — H52222 Regular astigmatism, left eye: Secondary | ICD-10-CM | POA: Diagnosis not present

## 2021-03-26 DIAGNOSIS — H25812 Combined forms of age-related cataract, left eye: Secondary | ICD-10-CM | POA: Diagnosis not present

## 2021-03-28 ENCOUNTER — Encounter (HOSPITAL_COMMUNITY)
Admission: RE | Admit: 2021-03-28 | Discharge: 2021-03-28 | Disposition: A | Discharge status: Home / Self Care | Payer: Medicare Other | Source: Ambulatory Visit | Attending: Ophthalmology | Admitting: Ophthalmology

## 2021-03-28 ENCOUNTER — Other Ambulatory Visit: Payer: Self-pay

## 2021-03-29 ENCOUNTER — Encounter (HOSPITAL_COMMUNITY): Payer: Self-pay

## 2021-04-02 ENCOUNTER — Encounter (HOSPITAL_COMMUNITY): Payer: Self-pay | Admitting: Ophthalmology

## 2021-04-02 ENCOUNTER — Ambulatory Visit (HOSPITAL_COMMUNITY): Payer: Medicare Other | Admitting: Anesthesiology

## 2021-04-02 ENCOUNTER — Other Ambulatory Visit: Payer: Self-pay

## 2021-04-02 ENCOUNTER — Encounter (HOSPITAL_COMMUNITY)
Admission: RE | Disposition: A | Discharge status: Home / Self Care | Payer: Self-pay | Source: Home / Self Care | Attending: Ophthalmology

## 2021-04-02 ENCOUNTER — Ambulatory Visit (HOSPITAL_COMMUNITY)
Admission: RE | Admit: 2021-04-02 | Discharge: 2021-04-02 | Disposition: A | Discharge status: Home / Self Care | Payer: Medicare Other | Attending: Ophthalmology | Admitting: Ophthalmology

## 2021-04-02 DIAGNOSIS — H25812 Combined forms of age-related cataract, left eye: Secondary | ICD-10-CM | POA: Insufficient documentation

## 2021-04-02 DIAGNOSIS — I739 Peripheral vascular disease, unspecified: Secondary | ICD-10-CM | POA: Insufficient documentation

## 2021-04-02 DIAGNOSIS — H52223 Regular astigmatism, bilateral: Secondary | ICD-10-CM | POA: Diagnosis not present

## 2021-04-02 DIAGNOSIS — I509 Heart failure, unspecified: Secondary | ICD-10-CM | POA: Diagnosis not present

## 2021-04-02 DIAGNOSIS — I110 Hypertensive heart disease with heart failure: Secondary | ICD-10-CM | POA: Insufficient documentation

## 2021-04-02 DIAGNOSIS — H409 Unspecified glaucoma: Secondary | ICD-10-CM | POA: Diagnosis not present

## 2021-04-02 DIAGNOSIS — K219 Gastro-esophageal reflux disease without esophagitis: Secondary | ICD-10-CM | POA: Diagnosis not present

## 2021-04-02 DIAGNOSIS — Z961 Presence of intraocular lens: Secondary | ICD-10-CM | POA: Diagnosis not present

## 2021-04-02 DIAGNOSIS — I4891 Unspecified atrial fibrillation: Secondary | ICD-10-CM | POA: Insufficient documentation

## 2021-04-02 DIAGNOSIS — I480 Paroxysmal atrial fibrillation: Secondary | ICD-10-CM | POA: Diagnosis not present

## 2021-04-02 DIAGNOSIS — H52222 Regular astigmatism, left eye: Secondary | ICD-10-CM | POA: Diagnosis not present

## 2021-04-02 HISTORY — PX: CATARACT EXTRACTION W/PHACO: SHX586

## 2021-04-02 SURGERY — PHACOEMULSIFICATION, CATARACT, WITH IOL INSERTION
Anesthesia: Monitor Anesthesia Care | Site: Eye | Laterality: Left

## 2021-04-02 MED ORDER — SODIUM HYALURONATE 10 MG/ML IO SOLUTION
PREFILLED_SYRINGE | INTRAOCULAR | Status: DC | PRN
  Administered 2021-04-02: 0.85 mL via INTRAOCULAR

## 2021-04-02 MED ORDER — TROPICAMIDE 1 % OP SOLN
1.0000 [drp] | OPHTHALMIC | Status: AC | PRN
  Administered 2021-04-02 (×3): 1 [drp] via OPHTHALMIC

## 2021-04-02 MED ORDER — STERILE WATER FOR IRRIGATION IR SOLN
Status: DC | PRN
  Administered 2021-04-02: 250 mL

## 2021-04-02 MED ORDER — LIDOCAINE HCL 3.5 % OP GEL
1.0000 "application " | Freq: Once | OPHTHALMIC | Status: AC
  Administered 2021-04-02: 1 via OPHTHALMIC

## 2021-04-02 MED ORDER — POVIDONE-IODINE 5 % OP SOLN
OPHTHALMIC | Status: DC | PRN
  Administered 2021-04-02: 1 via OPHTHALMIC

## 2021-04-02 MED ORDER — TETRACAINE HCL 0.5 % OP SOLN
1.0000 [drp] | OPHTHALMIC | Status: AC | PRN
  Administered 2021-04-02 (×3): 1 [drp] via OPHTHALMIC

## 2021-04-02 MED ORDER — BSS IO SOLN
INTRAOCULAR | Status: DC | PRN
  Administered 2021-04-02: 15 mL via INTRAOCULAR

## 2021-04-02 MED ORDER — PHENYLEPHRINE HCL 2.5 % OP SOLN
1.0000 [drp] | OPHTHALMIC | Status: AC | PRN
  Administered 2021-04-02 (×3): 1 [drp] via OPHTHALMIC

## 2021-04-02 MED ORDER — NEOMYCIN-POLYMYXIN-DEXAMETH 3.5-10000-0.1 OP SUSP
OPHTHALMIC | Status: DC | PRN
  Administered 2021-04-02: 1 [drp] via OPHTHALMIC

## 2021-04-02 MED ORDER — EPINEPHRINE PF 1 MG/ML IJ SOLN
INTRAOCULAR | Status: DC | PRN
  Administered 2021-04-02: 500 mL

## 2021-04-02 MED ORDER — SODIUM HYALURONATE 23MG/ML IO SOSY
PREFILLED_SYRINGE | INTRAOCULAR | Status: DC | PRN
  Administered 2021-04-02: 0.6 mL via INTRAOCULAR

## 2021-04-02 MED ORDER — LIDOCAINE HCL (PF) 1 % IJ SOLN
INTRAOCULAR | Status: DC | PRN
  Administered 2021-04-02: .9 mL via OPHTHALMIC

## 2021-04-02 SURGICAL SUPPLY — 14 items
CLOTH BEACON ORANGE TIMEOUT ST (SAFETY) ×1 IMPLANT
EYE SHIELD UNIVERSAL CLEAR (GAUZE/BANDAGES/DRESSINGS) ×1 IMPLANT
GLOVE SURG UNDER POLY LF SZ6.5 (GLOVE) ×1 IMPLANT
GLOVE SURG UNDER POLY LF SZ7 (GLOVE) ×1 IMPLANT
NDL HYPO 18GX1.5 BLUNT FILL (NEEDLE) IMPLANT
NEEDLE HYPO 18GX1.5 BLUNT FILL (NEEDLE) ×2 IMPLANT
PAD ARMBOARD 7.5X6 YLW CONV (MISCELLANEOUS) ×1 IMPLANT
PROC W SPEC LENS (INTRAOCULAR LENS)
PROCESS W SPEC LENS (INTRAOCULAR LENS) IMPLANT
SYR TB 1ML LL NO SAFETY (SYRINGE) ×1 IMPLANT
TAPE SURG TRANSPORE 1 IN (GAUZE/BANDAGES/DRESSINGS) IMPLANT
TAPE SURGICAL TRANSPORE 1 IN (GAUZE/BANDAGES/DRESSINGS) ×2
TECNIS EYHANCE TORIC II IOL (Intraocular Lens) ×1 IMPLANT
WATER STERILE IRR 250ML POUR (IV SOLUTION) ×1 IMPLANT

## 2021-04-02 NOTE — Interval H&P Note (Signed)
History and Physical Interval Note:  04/02/2021 12:18 PM  [name]  has presented today for surgery, with the diagnosis of nuclear cataract left eye.  The various methods of treatment have been discussed with the patient and family. After consideration of risks, benefits and other options for treatment, the patient has consented to  Procedure(s) with comments: CATARACT EXTRACTION PHACO AND INTRAOCULAR LENS PLACEMENT (Talent) (Left) - left as a surgical intervention.  The patient's history has been reviewed, patient examined, no change in status, stable for surgery.  I have reviewed the patient's chart and labs.  Questions were answered to the patient's satisfaction.     Lydia Graham

## 2021-04-02 NOTE — Anesthesia Postprocedure Evaluation (Signed)
Anesthesia Post Note  Patient: Lydia Graham  Procedure(s) Performed: CATARACT EXTRACTION PHACO AND INTRAOCULAR LENS PLACEMENT LEFT EYE (Left: Eye)  Patient location during evaluation: Phase II Anesthesia Type: MAC Level of consciousness: awake and alert and oriented Pain management: pain level controlled Respiratory status: spontaneous breathing and respiratory function stable Cardiovascular status: blood pressure returned to baseline and stable Postop Assessment: no apparent nausea or vomiting Anesthetic complications: no   No notable events documented.   Last Vitals:  Vitals:   04/02/21 1133 04/02/21 1245  BP: (!) 167/64 (!) 159/65  Pulse: (!) 54 (!) 56  Resp: 17 18  Temp: 36.7 C (!) 36.4 C  SpO2: 99% 100%    Last Pain:  Vitals:   04/02/21 1245  TempSrc: Axillary  PainSc: 0-No pain                 Lydia Graham C Ayeisha Lindenberger

## 2021-04-02 NOTE — Transfer of Care (Signed)
Immediate Anesthesia Transfer of Care Note  Patient: Lydia Graham  Procedure(s) Performed: CATARACT EXTRACTION PHACO AND INTRAOCULAR LENS PLACEMENT LEFT EYE (Left: Eye)  Patient Location: Short Stay  Anesthesia Type:MAC  Level of Consciousness: awake, alert  and oriented  Airway & Oxygen Therapy: Patient Spontanous Breathing  Post-op Assessment: Report given to RN, Post -op Vital signs reviewed and stable and Patient moving all extremities X 4  Post vital signs: Reviewed and stable  Last Vitals:  Vitals Value Taken Time  BP    Temp    Pulse    Resp    SpO2      Last Pain:  Vitals:   04/02/21 1133  TempSrc: Oral  PainSc: 0-No pain      Patients Stated Pain Goal: 5 (24/26/83 4196)  Complications: No notable events documented.

## 2021-04-02 NOTE — Anesthesia Preprocedure Evaluation (Signed)
Anesthesia Evaluation  Patient identified by MRN, date of birth, ID band Patient awake    Reviewed: Allergy & Precautions, NPO status , Patient's Chart, lab work & pertinent test results, reviewed documented beta blocker date and time   History of Anesthesia Complications Negative for: history of anesthetic complications  Airway Mallampati: II  TM Distance: >3 FB Neck ROM: Full    Dental  (+) Dental Advisory Given, Teeth Intact   Pulmonary neg pulmonary ROS,    Pulmonary exam normal breath sounds clear to auscultation       Cardiovascular Exercise Tolerance: Good hypertension, Pt. on medications and Pt. on home beta blockers + Peripheral Vascular Disease (carotid endarterectomy) and +CHF  + dysrhythmias (on eliquis) Atrial Fibrillation  Rhythm:Regular Rate:Bradycardia     Neuro/Psych  Headaches, negative psych ROS   GI/Hepatic Neg liver ROS, GERD  Medicated and Controlled,  Endo/Other  negative endocrine ROS  Renal/GU negative Renal ROS  negative genitourinary   Musculoskeletal  (+) Arthritis , Osteoarthritis,    Abdominal   Peds negative pediatric ROS (+)  Hematology negative hematology ROS (+)   Anesthesia Other Findings  Vasovagal reaction/bradycardia in pre op area during IV catheter manipulation? When she had right eye cataract extraction.  Reproductive/Obstetrics                             Anesthesia Physical  Anesthesia Plan  ASA: 3  Anesthesia Plan: MAC   Post-op Pain Management:    Induction:   PONV Risk Score and Plan:   Airway Management Planned: Natural Airway and Nasal Cannula  Additional Equipment:   Intra-op Plan:   Post-operative Plan:   Informed Consent: I have reviewed the patients History and Physical, chart, labs and discussed the procedure including the risks, benefits and alternatives for the proposed anesthesia with the patient or authorized  representative who has indicated his/her understanding and acceptance.     Dental advisory given  Plan Discussed with: CRNA and Surgeon  Anesthesia Plan Comments:         Anesthesia Quick Evaluation

## 2021-04-02 NOTE — Op Note (Signed)
Date of procedure: 04/02/21  Pre-operative diagnosis: Visually significant combined age-related cataract, Left Eye; Visually Significant Astigmatism, Left Eye (H25.812)  Post-operative diagnosis: Visually significant age-related cataract, Left Eye; Visually Significant Astigmatism, Left Eye  Procedure: Removal of cataract via phacoemulsification and insertion of intra-ocular lens Wynetta Emery and Johnson DIU225 +21.5D into the capsular bag of the Left Eye  Attending surgeon: Gerda Diss. Armenta Erskin, MD, MA  Anesthesia: MAC, Topical Akten  Complications: None  Estimated Blood Loss: <14m (minimal)  Specimens: None  Implants: As above  Indications:  Visually significant age-related cataract, Left Eye; Visually Significant Astigmatism, Left Eye  Procedure:  The patient was seen and identified in the pre-operative area. The operative eye was identified and dilated.  The operative eye was marked.  Pre-operative toric markers were used to mark the eye at 0 and 180 degrees. Topical anesthesia was administered to the operative eye.     The patient was then to the operative suite and placed in the supine position.  A timeout was performed confirming the patient, procedure to be performed, and all other relevant information.   The patient's face was prepped and draped in the usual fashion for intra-ocular surgery.  A lid speculum was placed into the operative eye and the surgical microscope moved into place and focused.  A superotemporal paracentesis was created using a 20 gauge paracentesis blade.  Shugarcaine was injected into the anterior chamber.  Viscoelastic was injected into the anterior chamber.  A temporal clear-corneal main wound incision was created using a 2.446mmicrokeratome.  A continuous curvilinear capsulorrhexis was initiated using an irrigating cystitome and completed using capsulorrhexis forceps.  Hydrodissection and hydrodeliniation were performed.  Viscoelastic was injected into the anterior  chamber.  A phacoemulsification handpiece and a chopper as a second instrument were used to remove the nucleus and epinucleus. The irrigation/aspiration handpiece was used to remove any remaining cortical material.   The capsular bag was reinflated with viscoelastic, checked, and found to be intact.  The eye was marked to the per-op meridian.  The intraocular lens was inserted into the capsular bag and dialed into place using a Kuglen hook to ?? degrees.  The irrigation/aspiration handpiece was used to remove any remaining viscoelastic.  The clear corneal wound and paracentesis wounds were then hydrated and checked with Weck-Cels to be watertight.  The lid-speculum and drape was removed, and the patient's face was cleaned with a wet and dry 4x4.  Maxitrol was instilled in the eye before a clear shield was taped over the eye. The patient was taken to the post-operative care unit in good condition, having tolerated the procedure well.  Post-Op Instructions: [facility] and will receive all other orders and instructions.

## 2021-04-02 NOTE — Anesthesia Procedure Notes (Addendum)
Procedure Name: MAC Date/Time: 04/02/2021 12:25 PM Performed by: Orlie Dakin, CRNA Pre-anesthesia Checklist: Patient identified, Emergency Drugs available, Suction available and Patient being monitored Patient Re-evaluated:Patient Re-evaluated prior to induction Oxygen Delivery Method: Nasal cannula Placement Confirmation: positive ETCO2

## 2021-04-02 NOTE — Discharge Instructions (Addendum)
Please discharge patient when stable, will follow up today with Dr. Wrzosek at the [facility] office immediately following discharge.  Leave shield in place until visit.  All paperwork with discharge instructions will be given at the office.  [facility] Address:  [address]  [place], [place] [address]  

## 2021-04-03 ENCOUNTER — Encounter (HOSPITAL_COMMUNITY): Payer: Self-pay | Admitting: Ophthalmology

## 2021-04-05 DIAGNOSIS — R5383 Other fatigue: Secondary | ICD-10-CM | POA: Diagnosis not present

## 2021-04-05 DIAGNOSIS — Z789 Other specified health status: Secondary | ICD-10-CM | POA: Diagnosis not present

## 2021-04-05 DIAGNOSIS — Z7189 Other specified counseling: Secondary | ICD-10-CM | POA: Diagnosis not present

## 2021-04-05 DIAGNOSIS — Z6824 Body mass index (BMI) 24.0-24.9, adult: Secondary | ICD-10-CM | POA: Diagnosis not present

## 2021-04-05 DIAGNOSIS — Z1331 Encounter for screening for depression: Secondary | ICD-10-CM | POA: Diagnosis not present

## 2021-04-05 DIAGNOSIS — Z1339 Encounter for screening examination for other mental health and behavioral disorders: Secondary | ICD-10-CM | POA: Diagnosis not present

## 2021-04-05 DIAGNOSIS — E7800 Pure hypercholesterolemia, unspecified: Secondary | ICD-10-CM | POA: Diagnosis not present

## 2021-04-05 DIAGNOSIS — Z299 Encounter for prophylactic measures, unspecified: Secondary | ICD-10-CM | POA: Diagnosis not present

## 2021-04-05 DIAGNOSIS — Z79899 Other long term (current) drug therapy: Secondary | ICD-10-CM | POA: Diagnosis not present

## 2021-04-05 DIAGNOSIS — I10 Essential (primary) hypertension: Secondary | ICD-10-CM | POA: Diagnosis not present

## 2021-04-05 DIAGNOSIS — Z0000 Encounter for general adult medical examination without abnormal findings: Secondary | ICD-10-CM | POA: Diagnosis not present

## 2021-04-06 ENCOUNTER — Other Ambulatory Visit: Payer: Self-pay | Admitting: Family Medicine

## 2021-04-12 ENCOUNTER — Telehealth: Payer: Self-pay | Admitting: *Deleted

## 2021-04-12 NOTE — Telephone Encounter (Signed)
Laurine Blazer, LPN  [id number]  4:48 PM EDT Back to Top    Notified, copy to pcp.

## 2021-04-12 NOTE — Telephone Encounter (Signed)
-----   Message from Arnoldo Lenis, MD sent at 04/03/2021  8:50 AM EDT ----- Echo looks good, normal heart pumping function. Her mitral valve has just a slight leak to it that is not of concern at this time  Zandra Abts MD

## 2021-04-16 DIAGNOSIS — Z1231 Encounter for screening mammogram for malignant neoplasm of breast: Secondary | ICD-10-CM | POA: Diagnosis not present

## 2021-04-16 DIAGNOSIS — Z6824 Body mass index (BMI) 24.0-24.9, adult: Secondary | ICD-10-CM | POA: Diagnosis not present

## 2021-04-16 DIAGNOSIS — Z01419 Encounter for gynecological examination (general) (routine) without abnormal findings: Secondary | ICD-10-CM | POA: Diagnosis not present

## 2021-04-26 ENCOUNTER — Other Ambulatory Visit: Payer: Self-pay | Admitting: Internal Medicine

## 2021-05-13 ENCOUNTER — Other Ambulatory Visit: Payer: Self-pay

## 2021-05-13 DIAGNOSIS — Z9889 Other specified postprocedural states: Secondary | ICD-10-CM

## 2021-05-16 ENCOUNTER — Other Ambulatory Visit: Payer: Self-pay | Admitting: Internal Medicine

## 2021-05-29 DIAGNOSIS — L84 Corns and callosities: Secondary | ICD-10-CM | POA: Diagnosis not present

## 2021-05-29 DIAGNOSIS — L570 Actinic keratosis: Secondary | ICD-10-CM | POA: Diagnosis not present

## 2021-05-29 DIAGNOSIS — I70203 Unspecified atherosclerosis of native arteries of extremities, bilateral legs: Secondary | ICD-10-CM | POA: Diagnosis not present

## 2021-05-29 DIAGNOSIS — Z85828 Personal history of other malignant neoplasm of skin: Secondary | ICD-10-CM | POA: Diagnosis not present

## 2021-05-29 DIAGNOSIS — B351 Tinea unguium: Secondary | ICD-10-CM | POA: Diagnosis not present

## 2021-05-29 DIAGNOSIS — Z1283 Encounter for screening for malignant neoplasm of skin: Secondary | ICD-10-CM | POA: Diagnosis not present

## 2021-05-29 DIAGNOSIS — M79676 Pain in unspecified toe(s): Secondary | ICD-10-CM | POA: Diagnosis not present

## 2021-06-13 ENCOUNTER — Other Ambulatory Visit: Payer: Self-pay

## 2021-06-13 ENCOUNTER — Ambulatory Visit (INDEPENDENT_AMBULATORY_CARE_PROVIDER_SITE_OTHER): Payer: Medicare Other | Admitting: Physician Assistant

## 2021-06-13 ENCOUNTER — Ambulatory Visit (HOSPITAL_COMMUNITY)
Admission: RE | Admit: 2021-06-13 | Discharge: 2021-06-13 | Disposition: A | Discharge status: Home / Self Care | Payer: Medicare Other | Source: Ambulatory Visit | Attending: Vascular Surgery | Admitting: Vascular Surgery

## 2021-06-13 VITALS — BP 160/76 | HR 62 | Temp 97.6°F | Resp 20 | Ht 67.0 in | Wt 156.9 lb

## 2021-06-13 DIAGNOSIS — I6523 Occlusion and stenosis of bilateral carotid arteries: Secondary | ICD-10-CM | POA: Diagnosis not present

## 2021-06-13 DIAGNOSIS — Z9889 Other specified postprocedural states: Secondary | ICD-10-CM | POA: Diagnosis not present

## 2021-06-13 NOTE — Progress Notes (Signed)
Office Note     CC:  follow up Requesting Provider:  Glenda Chroman, MD  HPI: Lydia Graham is a 85 y.o. (01/19/1936) female who presents to go over vascular studies related to carotid artery stenosis.  She is status post left carotid endarterectomy by Dr. Donzetta Matters in 2018 for high-grade asymptomatic stenosis.  Since last office visit 1 year ago she denies any diagnosis of CVA or TIA.  She also denies any strokelike symptoms including slurring speech, changes in vision, or one-sided weakness.  She is ambulatory with a cane.  She is taking Eliquis for atrial fibrillation.  She does not take aspirin.  She denies tobacco use.   Past Medical History:  Diagnosis Date   Arthritis    oa, all over - multiple areas    Carotid artery occlusion    Dysrhythmia    AFib    GERD (gastroesophageal reflux disease)    Headache    h/o migraines    Hypertension     Past Surgical History:  Procedure Laterality Date   ABDOMINAL HYSTERECTOMY     APPENDECTOMY     BACK SURGERY     CARDIOVERSION N/A 12/19/2017   Procedure: CARDIOVERSION;  Surgeon: Arnoldo Lenis, MD;  [facility];  Service: Endoscopy;  Laterality: N/A;   CARDIOVERSION N/A 12/26/2017   Procedure: CARDIOVERSION;  Surgeon: [name], MD;  Location: [place];  Service: Cardiovascular;  Laterality: N/A;   CARDIOVERSION N/A 01/26/2018   Procedure: CARDIOVERSION;  Surgeon: Jerline Pain, MD;  Location: [facility];  Service: Cardiovascular;  Laterality: N/A;   CAROTID ENDARTERECTOMY     CATARACT EXTRACTION W/PHACO Right 03/09/2021   Procedure: CATARACT EXTRACTION PHACO AND INTRAOCULAR LENS PLACEMENT with Placement of Corticosteroid ([place]);  Surgeon: [name], MD;  [facility];  Service: Ophthalmology;  Laterality: Right;  CDE 9.16   CATARACT EXTRACTION W/PHACO Left 04/02/2021   Procedure: CATARACT EXTRACTION PHACO AND INTRAOCULAR LENS PLACEMENT LEFT EYE;  Surgeon: [name], MD;  [facility]: [facility];   Service: Ophthalmology;  Laterality: Left;  left CDE=6.75   ENDARTERECTOMY Left 03/27/2017   Procedure: ENDARTERECTOMY CAROTID LEFT;  Surgeon: [name], MD;  Location: [place];  Service: Vascular;  Laterality: Left;   INNER EAR SURGERY Right    puntured eardrum- repaired 2x's    KNEE ARTHROPLASTY     NASAL SINUS SURGERY     deviated septum   PATCH ANGIOPLASTY Left 03/27/2017   Procedure: PATCH ANGIOPLASTY LEFT CAROTID ARTERY USING Rueben Bash BIOLOGIC PATCH;  Surgeon: [name], MD;  Location: [place];  Service: Vascular;  Laterality: Left;   TONSILLECTOMY     TOTAL KNEE ARTHROPLASTY Right 10/23/2016   Procedure: RIGHT TOTAL KNEE ARTHROPLASTY;  Surgeon: [name], MD;  Location: [place];  Service: Orthopedics;  Laterality: Right;    Social History   Socioeconomic History   Marital status: Single    Spouse name: Not on file   Number of children: Not on file   Years of education: Not on file   Highest education level: Not on file  Occupational History   Not on file  Tobacco Use   Smoking status: Never   Smokeless tobacco: Never  Vaping Use   Vaping Use: Never used  Substance and Sexual Activity   Alcohol use: No   Drug use: No   Sexual activity: Not Currently    Birth control/protection: Surgical  Other Topics Concern   Not on file  Social History Narrative  Not on file   Social Determinants of Health   Financial Resource Strain: Not on file  Food Insecurity: Not on file  Transportation Needs: Not on file  Physical Activity: Not on file  Stress: Not on file  Social Connections: Not on file  Intimate Partner Violence: Not on file    Family History  Problem Relation Age of Onset   Stroke Mother    Rheum arthritis Sister     Current Outpatient Medications  Medication Sig Dispense Refill   amiodarone (PACERONE) 200 MG tablet TAKE 1/2 TABLET EVERY OTHER DAY. 8 tablet 2   amLODipine (NORVASC) 5 MG tablet TAKE 1 TABLET AT 8AM AND 1 TABLET  AT 8PM. 180 tablet 2   apixaban (ELIQUIS) 5 MG TABS tablet TAKE (1) TABLET TWICE DAILY. 180 tablet 1   Calcium Carbonate-Vitamin D (CALCIUM 600+D PO) Take 1 tablet by mouth daily.     Camphor-Menthol-Methyl Sal (SALONPAS) 3.06-29-08 % PTCH Place 1 patch onto the skin at bedtime as needed (foot pain).     estradiol (ESTRACE) 0.5 MG tablet Take 0.5 mg by mouth daily.      fluticasone (FLONASE) 50 MCG/ACT nasal spray Place 2 sprays into both nostrils daily as needed for allergies.      furosemide (LASIX) 20 MG tablet TAKE 1 TABLET BY MOUTH DAILY AS NEEDED FOR EDEMA (LEG SWELLING/SHORTNESS OF BREATH). 90 tablet 0   Hypromellose (ARTIFICIAL TEARS OP) Place 2 drops into both eyes 2 (two) times daily as needed (for dry eyes).     losartan (COZAAR) 50 MG tablet TAKE 1 TABLET AT 10 AM AND 1 TABLET AT 10 PM. 60 tablet 3   magnesium hydroxide (MILK OF MAGNESIA) 400 MG/5ML suspension Take 30 mLs by mouth daily as needed for mild constipation.     metoprolol succinate (TOPROL-XL) 50 MG 24 hr tablet TAKE 1 TABLET AT LUNCH OR IMMEDIATELY FOLLOWING A MEAL. 30 tablet 11   pantoprazole (PROTONIX) 40 MG tablet TAKE (1) TABLET TWICE DAILY. 60 tablet 3   potassium chloride SA (K-DUR,KLOR-CON) 20 MEQ tablet Take 1 tablet (20 mEq total) by mouth as needed (take on days you take your Furosemide).     vitamin B-12 (CYANOCOBALAMIN) 1000 MCG tablet Take 1,000 mcg by mouth daily.     No current facility-administered medications for this visit.    Allergies  Allergen Reactions   Rosuvastatin Other (See Comments)    MYALGIA, Muscle pain   Lisinopril Cough   Other Other (See Comments)    PAIN MEDICATIONS/ANESTHESIA  MADE BP DROP LOW    Singulair [Montelukast Sodium] Other (See Comments)    fatigue     REVIEW OF SYSTEMS:   [X]  denotes positive finding, [ ]  denotes negative finding Cardiac  Comments:  Chest pain or chest pressure:    Shortness of breath upon exertion:    Short of breath when lying flat:     Irregular heart rhythm:        Vascular    Pain in calf, thigh, or hip brought on by ambulation:    Pain in feet at night that wakes you up from your sleep:     Blood clot in your veins:    Leg swelling:         Pulmonary    Oxygen at home:    Productive cough:     Wheezing:         Neurologic    Sudden weakness in arms or legs:     Sudden numbness  in arms or legs:     Sudden onset of difficulty speaking or slurred speech:    Temporary loss of vision in one eye:     Problems with dizziness:         Gastrointestinal    Blood in stool:     Vomited blood:         Genitourinary    Burning when urinating:     Blood in urine:        Psychiatric    Major depression:         Hematologic    Bleeding problems:    Problems with blood clotting too easily:        Skin    Rashes or ulcers:        Constitutional    Fever or chills:      PHYSICAL EXAMINATION:  Vitals:   06/13/21 0920 06/13/21 0921  BP: (!) 150/75 (!) 160/76  Pulse: 62   Resp: 20   Temp: 97.6 F (36.4 C)   TempSrc: Temporal   SpO2: 98%   Weight: 156 lb 14.4 oz (71.2 kg)   Height: 5\' 7"  (1.702 m)     General:  WDWN in NAD; vital signs documented above Gait: Not observed HENT: WNL, normocephalic Pulmonary: normal non-labored breathing Cardiac: regular HR Abdomen: soft, NT, no masses Skin: without rashes Vascular Exam/Pulses:  Right Left  Radial 2+ (normal) 2+ (normal)   Extremities: without ischemic changes, without Gangrene , without cellulitis; without open wounds;  Musculoskeletal: no muscle wasting or atrophy  Neurologic: A&O X 3;  CN grossly intact Psychiatric:  The pt has Normal affect.   Non-Invasive Vascular Imaging:   B ICA 1-39% stenosis    ASSESSMENT/PLAN:: 85 y.o. female here for follow up for carotid artery surveillance  -Not currently experiencing any neurological symptoms -Carotid duplex stable compared to 1 year ago.  Bilateral internal carotid artery stenosis 1 to  39% -Repeat carotid duplex in 1 year per protocol -Continue regular follow-up with PCP for management of chronic medical conditions   Dagoberto Ligas, PA-C [facility] [phone]

## 2021-07-27 ENCOUNTER — Ambulatory Visit (INDEPENDENT_AMBULATORY_CARE_PROVIDER_SITE_OTHER): Payer: Medicare Other | Admitting: Internal Medicine

## 2021-07-27 ENCOUNTER — Other Ambulatory Visit: Payer: Self-pay

## 2021-07-27 ENCOUNTER — Encounter: Payer: Self-pay | Admitting: Internal Medicine

## 2021-07-27 VITALS — BP 142/74 | HR 61 | Ht 67.0 in | Wt 153.8 lb

## 2021-07-27 DIAGNOSIS — I480 Paroxysmal atrial fibrillation: Secondary | ICD-10-CM

## 2021-07-27 DIAGNOSIS — D6869 Other thrombophilia: Secondary | ICD-10-CM | POA: Diagnosis not present

## 2021-07-27 DIAGNOSIS — I10 Essential (primary) hypertension: Secondary | ICD-10-CM

## 2021-07-27 NOTE — Progress Notes (Signed)
PCP: Glenda Chroman, MD   Primary EP: Dr Rayann Heman  Lydia Graham is a 86 y.o. female who presents today for routine electrophysiology followup.  Since last being seen in our clinic, the patient reports doing very well.  Today, she denies symptoms of palpitations, chest pain, shortness of breath,  lower extremity edema, dizziness, presyncope, or syncope.  The patient is otherwise without complaint today.   Past Medical History:  Diagnosis Date   Arthritis    oa, all over - multiple areas    Carotid artery occlusion    Dysrhythmia    AFib    GERD (gastroesophageal reflux disease)    Headache    h/o migraines    Hypertension    Past Surgical History:  Procedure Laterality Date   ABDOMINAL HYSTERECTOMY     APPENDECTOMY     BACK SURGERY     CARDIOVERSION N/A 12/19/2017   Procedure: CARDIOVERSION;  Surgeon: Arnoldo Lenis, MD;  [facility];  Service: Endoscopy;  Laterality: N/A;   CARDIOVERSION N/A 12/26/2017   Procedure: CARDIOVERSION;  Surgeon: [name], MD;  Location: [place];  Service: Cardiovascular;  Laterality: N/A;   CARDIOVERSION N/A 01/26/2018   Procedure: CARDIOVERSION;  Surgeon: Jerline Pain, MD;  [facility] END[facility];  Service: Cardiovascular;  Laterality: N/A;   CAROTID ENDARTERECTOMY     CATARACT EXTRACTION W/PHACO Right 03/09/2021   Procedure: CATARACT EXTRACTION PHACO AND INTRAOCULAR LENS PLACEMENT with Placement of Corticosteroid ([place]);  Surgeon: [name], MD;  [facility];  Service: Ophthalmology;  Laterality: Right;  CDE 9.16   CATARACT EXTRACTION W/PHACO Left 04/02/2021   Procedure: CATARACT EXTRACTION PHACO AND INTRAOCULAR LENS PLACEMENT LEFT EYE;  Surgeon: [name], MD;  [facility];  Service: Ophthalmology;  Laterality: Left;  left CDE=6.75   ENDARTERECTOMY Left 03/27/2017   Procedure: ENDARTERECTOMY CAROTID LEFT;  Surgeon: [name], MD;  Location: [place];  Service: Vascular;  Laterality: Left;    INNER EAR SURGERY Right    puntured eardrum- repaired 2x's    KNEE ARTHROPLASTY     NASAL SINUS SURGERY     deviated septum   PATCH ANGIOPLASTY Left 03/27/2017   Procedure: PATCH ANGIOPLASTY LEFT CAROTID ARTERY USING Rueben Bash BIOLOGIC PATCH;  Surgeon: [name], MD;  Location: [place];  Service: Vascular;  Laterality: Left;   TONSILLECTOMY     TOTAL KNEE ARTHROPLASTY Right 10/23/2016   Procedure: RIGHT TOTAL KNEE ARTHROPLASTY;  Surgeon: [name], MD;  Location: [place];  Service: Orthopedics;  Laterality: Right;    ROS- all systems are reviewed and negatives except as per HPI above  Current Outpatient Medications  Medication Sig Dispense Refill   amiodarone (PACERONE) 200 MG tablet TAKE 1/2 TABLET EVERY OTHER DAY. 8 tablet 2   amLODipine (NORVASC) 5 MG tablet TAKE 1 TABLET AT 8AM AND 1 TABLET AT 8PM. 180 tablet 2   apixaban (ELIQUIS) 5 MG TABS tablet TAKE (1) TABLET TWICE DAILY. 180 tablet 1   Calcium Carbonate-Vitamin D (CALCIUM 600+D PO) Take 1 tablet by mouth daily.     Camphor-Menthol-Methyl Sal (SALONPAS) 3.06-29-08 % PTCH Place 1 patch onto the skin at bedtime as needed (foot pain).     estradiol (ESTRACE) 0.5 MG tablet Take 0.5 mg by mouth daily.      fluticasone (FLONASE) 50 MCG/ACT nasal spray Place 2 sprays into both nostrils daily as needed for allergies.      furosemide (LASIX) 20 MG tablet TAKE 1 TABLET  BY MOUTH DAILY AS NEEDED FOR EDEMA (LEG SWELLING/SHORTNESS OF BREATH). 90 tablet 0   Hypromellose (ARTIFICIAL TEARS OP) Place 2 drops into both eyes 2 (two) times daily as needed (for dry eyes).     losartan (COZAAR) 50 MG tablet TAKE 1 TABLET AT 10 AM AND 1 TABLET AT 10 PM. 60 tablet 3   magnesium hydroxide (MILK OF MAGNESIA) 400 MG/5ML suspension Take 30 mLs by mouth daily as needed for mild constipation.     metoprolol succinate (TOPROL-XL) 50 MG 24 hr tablet TAKE 1 TABLET AT LUNCH OR IMMEDIATELY FOLLOWING A MEAL. 30 tablet 11   pantoprazole (PROTONIX) 40 MG  tablet TAKE (1) TABLET TWICE DAILY. 60 tablet 3   potassium chloride SA (K-DUR,KLOR-CON) 20 MEQ tablet Take 1 tablet (20 mEq total) by mouth as needed (take on days you take your Furosemide).     vitamin B-12 (CYANOCOBALAMIN) 1000 MCG tablet Take 1,000 mcg by mouth daily.     No current facility-administered medications for this visit.    Physical Exam: Vitals:   07/27/21 1017  BP: (!) 142/74  Pulse: 61  SpO2: 97%  Weight: 153 lb 12.8 oz (69.8 kg)  Height: 5\' 7"  (1.702 m)    GEN- The patient is well appearing, alert and oriented x 3 today.   Head- normocephalic, atraumatic Eyes-  Sclera clear, conjunctiva pink Ears- hearing intact Oropharynx- clear Lungs- Clear to ausculation bilaterally, normal work of breathing Heart- Regular rate and rhythm, no murmurs, rubs or gallops, PMI not laterally displaced GI- soft, NT, ND, + BS Extremities- no clubbing, cyanosis, or edema  Wt Readings from Last 3 Encounters:  07/27/21 153 lb 12.8 oz (69.8 kg)  06/13/21 156 lb 14.4 oz (71.2 kg)  03/20/21 148 lb (67.1 kg)    EKG tracing ordered today is personally reviewed and shows sinus with LBBB  Assessment and Plan:  Persistent atrial fibrillation Doing well with amiodarone 100mg  QOD She is on eliquis for stroke prevention (chads2vasc score is 5) Labs 07/28/20 reviewed personally  2. HTN Stable No change required today Labs 07/28/20 reviewed  3. Chronic diastolic dysfunction Stable No change required today  4. S/p prior CEA for L ICA stenosis in 2018 Stable No change required today Follows with Vascular surgery  5. Moderate MR Follows with Dr Harl Bowie His notes reviewed MR trivial by echo 03/22/21 (reviewed)  Risks, benefits and potential toxicities for medications prescribed and/or refilled reviewed with patient today.   Return in a year to see me Follow-up with Dr Harl Bowie in the interim  [name] MD, [facility] 07/27/2021 10:23 AM

## 2021-07-27 NOTE — Patient Instructions (Signed)
Medication Instructions:  Continue all current medications.  Labwork: none  Testing/Procedures: none  Follow-Up: 1 year - Dr.  Rayann Heman  6 months - Dr. Harl Bowie   Any Other Special Instructions Will Be Listed Below (If Applicable).   If you need a refill on your cardiac medications before your next appointment, please call your pharmacy.

## 2021-08-02 ENCOUNTER — Other Ambulatory Visit: Payer: Self-pay | Admitting: Internal Medicine

## 2021-08-02 ENCOUNTER — Ambulatory Visit: Payer: Medicare Other | Admitting: Cardiology

## 2021-08-07 DIAGNOSIS — I70203 Unspecified atherosclerosis of native arteries of extremities, bilateral legs: Secondary | ICD-10-CM | POA: Diagnosis not present

## 2021-08-07 DIAGNOSIS — L84 Corns and callosities: Secondary | ICD-10-CM | POA: Diagnosis not present

## 2021-08-07 DIAGNOSIS — B351 Tinea unguium: Secondary | ICD-10-CM | POA: Diagnosis not present

## 2021-08-07 DIAGNOSIS — M79676 Pain in unspecified toe(s): Secondary | ICD-10-CM | POA: Diagnosis not present

## 2021-08-15 ENCOUNTER — Other Ambulatory Visit: Payer: Self-pay | Admitting: Internal Medicine

## 2021-08-15 DIAGNOSIS — I4891 Unspecified atrial fibrillation: Secondary | ICD-10-CM

## 2021-08-15 NOTE — Telephone Encounter (Signed)
Eliquis 5mg  refill request received. Patient is 86 years old, weight-69.8kg, Crea-1.28 on 04/05/2021 via [place] from [place], and last seen by [name] on 07/27/2021. Dose is appropriate based on dosing criteria. Will send in refill to requested pharmacy.

## 2021-09-24 ENCOUNTER — Other Ambulatory Visit: Payer: Self-pay | Admitting: Cardiology

## 2021-10-08 ENCOUNTER — Ambulatory Visit (INDEPENDENT_AMBULATORY_CARE_PROVIDER_SITE_OTHER): Payer: Medicare Other

## 2021-10-08 ENCOUNTER — Ambulatory Visit (INDEPENDENT_AMBULATORY_CARE_PROVIDER_SITE_OTHER): Payer: Medicare Other | Admitting: Orthopedic Surgery

## 2021-10-08 DIAGNOSIS — M79672 Pain in left foot: Secondary | ICD-10-CM

## 2021-10-08 MED ORDER — PREDNISONE 10 MG PO TABS: 10.0000 mg | ORAL_TABLET | Freq: Every day | ORAL | 1 refills | Status: DC

## 2021-10-21 ENCOUNTER — Encounter: Payer: Self-pay | Admitting: Orthopedic Surgery

## 2021-10-21 NOTE — Progress Notes (Signed)
? ?Office Visit Note ?  ?Patient: Lydia Graham           ?Date of Birth: 10-06-1935           ?MRN: [id number] ?Visit Date: 10/08/2021 ?             ?Requested by: Glenda Chroman, MD ?[address] ?[place],  [place] [address] ?PCP: Glenda Chroman, MD ? ?Chief Complaint  ?Patient presents with  ? Left Foot - Pain  ? ? ? ? ?HPI: ?Patient is a 86 year old woman presents stating that her left foot is killing her.  She states she has lateral sided foot pain for a week.  Ambulates with a cane has pain with ambulation. ? ?Assessment & Plan: ?Visit Diagnoses:  ?1. Pain in left foot   ? ? ?Plan: Patient's pain seems to be from the degenerative changes across the midfoot.  Recommended a stiff soled shoe a prescription for prednisone is provided. ? ?Obtain three-view radiographs of the left foot at follow-up. ? ?Follow-Up Instructions: Return in about 4 weeks (around 11/05/2021).  ? ?Ortho Exam ? ?Patient is alert, oriented, no adenopathy, well-dressed, normal affect, normal respiratory effort. ?Examination there is no ecchymosis or bruising around the left foot.  Patient states she has tried Voltaren gel without relief.  The fifth metatarsal is not tender to palpation.  Pain reproduced with stress across the midfoot.  Patient states she started having pain when she was 86 years old. ? ?Imaging: ?No results found. ?No images are attached to the encounter. ? ?Labs: ?No results found for: HGBA1C, ESRSEDRATE, CRP, LABURIC, REPTSTATUS, GRAMSTAIN, CULT, LABORGA ? ? ?Lab Results  ?Component Value Date  ? ALBUMIN 3.4 (L) 12/26/2017  ? ALBUMIN 4.0 03/25/2017  ? ? ?Lab Results  ?Component Value Date  ? MG 1.9 12/22/2017  ? ?No results found for: VD25OH ? ?No results found for: PREALBUMIN ? ?  Latest Ref Rng & Units 01/19/2018  ? 12:50 PM 12/24/2017  ?  4:39 AM 12/23/2017  ?  5:23 PM  ?CBC EXTENDED  ?WBC 4.0 - 10.5 K/uL 8.4   7.9   10.2    ?RBC 3.87 - 5.11 MIL/uL 5.07   4.73   5.17    ?Hemoglobin 12.0 - 15.0 g/dL 15.4   14.5   15.8    ?HCT 36.0 -  46.0 % 46.1   42.6   48.1    ?Platelets 150 - 400 K/uL 252   186   235    ? ? ? ?There is no height or weight on file to calculate BMI. ? ?Orders:  ?Orders Placed This Encounter  ?Procedures  ? XR Foot 2 Views Left  ? ?Meds ordered this encounter  ?Medications  ? predniSONE (DELTASONE) 10 MG tablet  ?  Sig: Take 1 tablet (10 mg total) by mouth daily with breakfast.  ?  Dispense:  30 tablet  ?  Refill:  1  ? ? ? Procedures: ?No procedures performed ? ?Clinical Data: ?No additional findings. ? ?ROS: ? ?All other systems negative, except as noted in the HPI. ?Review of Systems ? ?Objective: ?Vital Signs: LMP  (LMP Unknown)  ? ?Specialty Comments:  ?No specialty comments available. ? ?PMFS History: ?Patient Active Problem List  ? Diagnosis Date Noted  ? Acquired trigger finger of left middle finger 08/04/2020  ? Arthritis of hand 08/04/2020  ? Osteoarthritis of metacarpophalangeal (MCP) joint 08/03/2020  ? Bilateral hand pain 06/22/2020  ? Osteoarthritis of hands, 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  ?  Osteoarthritis of feet, 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  ? PAF (paroxysmal atrial fibrillation) ([place])   ? Chronic cough 12/29/2017  ? Atrial fibrillation with rapid ventricular response ([place]) 12/23/2017  ? Acute on chronic combined systolic and diastolic CHF (congestive heart failure) ([place])   ? Menopausal symptom 10/17/2017  ? Menopausal syndrome 10/17/2017  ? Carotid stenosis 03/27/2017  ? S/P total knee arthroplasty, right 10/23/2016  ? Unilateral primary osteoarthritis, right knee 07/13/2016  ? ?Past Medical History:  ?Diagnosis Date  ? Arthritis   ? oa, all over - multiple areas   ? Carotid artery occlusion   ? Dysrhythmia   ? AFib   ? GERD (gastroesophageal reflux disease)   ? Headache   ? h/o migraines   ? Hypertension   ?  ?Family History  ?Problem Relation Age of Onset  ? Stroke Mother   ? Rheum arthritis Sister   ?  ?Past Surgical History:  ?Procedure Laterality Date  ? ABDOMINAL HYSTERECTOMY    ? APPENDECTOMY    ? BACK SURGERY    ?  CARDIOVERSION N/A 12/19/2017  ? Procedure: CARDIOVERSION;  Surgeon: Arnoldo Lenis, MD;  [facility];  Service: Endoscopy;  Laterality: N/A;  ? CARDIOVERSION N/A 12/26/2017  ? Procedure: CARDIOVERSION;  Surgeon: [name], MD;  Location: [place];  Service: Cardiovascular;  Laterality: N/A;  ? CARDIOVERSION N/A 01/26/2018  ? Procedure: CARDIOVERSION;  Surgeon: Jerline Pain, MD;  Location: [facility] ENDOSCOPY;  Service: Cardiovascular;  Laterality: N/A;  ? CAROTID ENDARTERECTOMY    ? CATARACT EXTRACTION W/PHACO Right 03/09/2021  ? Procedure: CATARACT EXTRACTION PHACO AND INTRAOCULAR LENS PLACEMENT with Placement of Corticosteroid ([facility]);  Surgeon: [name], MD;  [facility];  Service: Ophthalmology;  Laterality: Right;  CDE 9.16  ? CATARACT EXTRACTION W/PHACO Left 04/02/2021  ? Procedure: CATARACT EXTRACTION PHACO AND INTRAOCULAR LENS PLACEMENT LEFT EYE;  Surgeon: [name], MD;  [facility];  Service: Ophthalmology;  Laterality: Left;  left ?CDE=6.75  ? ENDARTERECTOMY Left 03/27/2017  ? Procedure: ENDARTERECTOMY CAROTID LEFT;  Surgeon: [name], MD;  Location: [place];  Service: Vascular;  Laterality: Left;  ? INNER EAR SURGERY Right   ? puntured eardrum- repaired 2x's   ? KNEE ARTHROPLASTY    ? NASAL SINUS SURGERY    ? deviated septum  ? PATCH ANGIOPLASTY Left 03/27/2017  ? Procedure: PATCH ANGIOPLASTY LEFT CAROTID ARTERY USING Rueben Bash BIOLOGIC PATCH;  Surgeon: [name], MD;  Location: [place];  Service: Vascular;  Laterality: Left;  ? TONSILLECTOMY    ? TOTAL KNEE ARTHROPLASTY Right 10/23/2016  ? Procedure: RIGHT TOTAL KNEE ARTHROPLASTY;  Surgeon: [name], MD;  Location: [place];  Service: Orthopedics;  Laterality: Right;  ? ?Social History  ? ?Occupational History  ? Not on file  ?Tobacco Use  ? Smoking status: Never  ? Smokeless tobacco: Never  ?Vaping Use  ? Vaping Use: Never used  ?Substance and Sexual Activity  ? Alcohol use: No  ? Drug use:  No  ? Sexual activity: Not Currently  ?  Birth control/protection: Surgical  ? ? ? ? ? ?

## 2021-10-22 ENCOUNTER — Ambulatory Visit: Payer: Medicare Other | Admitting: Orthopedic Surgery

## 2021-10-30 ENCOUNTER — Ambulatory Visit (INDEPENDENT_AMBULATORY_CARE_PROVIDER_SITE_OTHER): Payer: Medicare Other | Admitting: Orthopedic Surgery

## 2021-10-30 ENCOUNTER — Ambulatory Visit (INDEPENDENT_AMBULATORY_CARE_PROVIDER_SITE_OTHER): Payer: Medicare Other

## 2021-10-30 ENCOUNTER — Other Ambulatory Visit: Payer: Self-pay | Admitting: Internal Medicine

## 2021-10-30 DIAGNOSIS — M79672 Pain in left foot: Secondary | ICD-10-CM | POA: Diagnosis not present

## 2021-10-30 DIAGNOSIS — R21 Rash and other nonspecific skin eruption: Secondary | ICD-10-CM | POA: Diagnosis not present

## 2021-10-30 DIAGNOSIS — Z6824 Body mass index (BMI) 24.0-24.9, adult: Secondary | ICD-10-CM | POA: Diagnosis not present

## 2021-10-30 DIAGNOSIS — M79673 Pain in unspecified foot: Secondary | ICD-10-CM | POA: Diagnosis not present

## 2021-10-30 DIAGNOSIS — Z299 Encounter for prophylactic measures, unspecified: Secondary | ICD-10-CM | POA: Diagnosis not present

## 2021-10-30 DIAGNOSIS — I4891 Unspecified atrial fibrillation: Secondary | ICD-10-CM | POA: Diagnosis not present

## 2021-10-30 DIAGNOSIS — I700 Atherosclerosis of aorta: Secondary | ICD-10-CM | POA: Diagnosis not present

## 2021-11-05 ENCOUNTER — Ambulatory Visit: Payer: Medicare Other | Admitting: Orthopedic Surgery

## 2021-11-07 ENCOUNTER — Other Ambulatory Visit: Payer: Self-pay | Admitting: Internal Medicine

## 2021-12-02 ENCOUNTER — Encounter: Payer: Self-pay | Admitting: Orthopedic Surgery

## 2021-12-02 NOTE — Progress Notes (Signed)
Office Visit Note   Patient: Lydia Graham           Date of Birth: Oct 24, 1935           MRN: [id number] Visit Date: 10/30/2021              Requested by: Glenda Chroman, MD [address] [place],  [place] [address] PCP: Glenda Chroman, MD  Chief Complaint  Patient presents with   Left Foot - Follow-up      HPI: Patient is an 86 year old woman presents with left foot pain.  She has been wearing stiff soled shoes has tried prednisone.  She has tried compression but this also causes foot pain.  Assessment & Plan: Visit Diagnoses:  1. Pain in left foot     Plan: Recommended stiff soled sneakers  Follow-Up Instructions: Return if symptoms worsen or fail to improve.   Ortho Exam  Patient is alert, oriented, no adenopathy, well-dressed, normal affect, normal respiratory effort. Examination patient has a good dorsalis pedis pulse.  There is no pain with walking patient states her pain is worse with compression socks.  There is no pain to palpation across the midfoot that shows degenerative arthritic changes.  There is no redness no swelling.  Imaging: No results found. No images are attached to the encounter.  Labs: No results found for: "HGBA1C", "ESRSEDRATE", "CRP", "LABURIC", "REPTSTATUS", "GRAMSTAIN", "CULT", "LABORGA"   Lab Results  Component Value Date   ALBUMIN 3.4 (L) 12/26/2017   ALBUMIN 4.0 03/25/2017    Lab Results  Component Value Date   MG 1.9 12/22/2017   No results found for: "VD25OH"  No results found for: "PREALBUMIN"    Latest Ref Rng & Units 01/19/2018   12:50 PM 12/24/2017    4:39 AM 12/23/2017    5:23 PM  CBC EXTENDED  WBC 4.0 - 10.5 K/uL 8.4  7.9  10.2   RBC 3.87 - 5.11 MIL/uL 5.07  4.73  5.17   Hemoglobin 12.0 - 15.0 g/dL 15.4  14.5  15.8   HCT 36.0 - 46.0 % 46.1  42.6  48.1   Platelets 150 - 400 K/uL 252  186  235      There is no height or weight on file to calculate BMI.  Orders:  Orders Placed This Encounter  Procedures   XR Foot  Complete Left   No orders of the defined types were placed in this encounter.    Procedures: No procedures performed  Clinical Data: No additional findings.  ROS:  All other systems negative, except as noted in the HPI. Review of Systems  Objective: Vital Signs: LMP  (LMP Unknown)   Specialty Comments:  No specialty comments available.  PMFS History: Patient Active Problem List   Diagnosis Date Noted   Acquired trigger finger of left middle finger 08/04/2020   Arthritis of hand 08/04/2020   Osteoarthritis of metacarpophalangeal (MCP) joint 08/03/2020   Bilateral hand pain 06/22/2020   Osteoarthritis of hands, 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   Osteoarthritis of feet, 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   PAF (paroxysmal atrial fibrillation) (HCC)    Chronic cough 12/29/2017   Atrial fibrillation with rapid ventricular response ([place]) 12/23/2017   Acute on chronic combined systolic and diastolic CHF (congestive heart failure) ([place])    Menopausal symptom 10/17/2017   Menopausal syndrome 10/17/2017   Carotid stenosis 03/27/2017   S/P total knee arthroplasty, right 10/23/2016   Unilateral primary osteoarthritis, right knee 07/13/2016   Past Medical History:  Diagnosis Date  Arthritis    oa, all over - multiple areas    Carotid artery occlusion    Dysrhythmia    AFib    GERD (gastroesophageal reflux disease)    Headache    h/o migraines    Hypertension     Family History  Problem Relation Age of Onset   Stroke Mother    Rheum arthritis Sister     Past Surgical History:  Procedure Laterality Date   ABDOMINAL HYSTERECTOMY     APPENDECTOMY     BACK SURGERY     CARDIOVERSION N/A 12/19/2017   Procedure: CARDIOVERSION;  Surgeon: Arnoldo Lenis, MD;  [facility];  Service: Endoscopy;  Laterality: N/A;   CARDIOVERSION N/A 12/26/2017   Procedure: CARDIOVERSION;  Surgeon: [name], MD;  Location: [place];  Service: Cardiovascular;  Laterality: N/A;   CARDIOVERSION  N/A 01/26/2018   Procedure: CARDIOVERSION;  Surgeon: Jerline Pain, MD;  Location[facility] END[facility]COPY;  Service: Cardiovascular;  Laterality: N/A;   CAROTID ENDARTERECTOMY     CATARACT EXTRACTION W/PHACO Right 03/09/2021   Procedure: CATARACT EXTRACTION PHACO AND INTRAOCULAR LENS PLACEMENT with Placement of Corticosteroid ([place]);  Surgeon: [name], MD;  Location: [facility];  Service: Ophthalmology;  Laterality: Right;  CDE 9.16   CATARACT EXTRACTION W/PHACO Left 04/02/2021   Procedure: CATARACT EXTRACTION PHACO AND INTRAOCULAR LENS PLACEMENT LEFT EYE;  Surgeon: [name], MD;  Location: [facility];  Service: Ophthalmology;  Laterality: Left;  left CDE=6.75   ENDARTERECTOMY Left 03/27/2017   Procedure: ENDARTERECTOMY CAROTID LEFT;  Surgeon: [name], MD;  Location: [place];  Service: Vascular;  Laterality: Left;   INNER EAR SURGERY Right    puntured eardrum- repaired 2x's    KNEE ARTHROPLASTY     NASAL SINUS SURGERY     deviated septum   PATCH ANGIOPLASTY Left 03/27/2017   Procedure: PATCH ANGIOPLASTY LEFT CAROTID ARTERY USING Rueben Bash BIOLOGIC PATCH;  Surgeon: [name], MD;  Location: [place];  Service: Vascular;  Laterality: Left;   TONSILLECTOMY     TOTAL KNEE ARTHROPLASTY Right 10/23/2016   Procedure: RIGHT TOTAL KNEE ARTHROPLASTY;  Surgeon: [name], MD;  Location: [facility];  Service: Orthopedics;  Laterality: Right;   Social History   Occupational History   Not on file  Tobacco Use   Smoking status: Never   Smokeless tobacco: Never  Vaping Use   Vaping Use: Never used  Substance and Sexual Activity   Alcohol use: No   Drug use: No   Sexual activity: Not Currently    Birth control/protection: Surgical

## 2021-12-24 ENCOUNTER — Inpatient Hospital Stay (HOSPITAL_COMMUNITY)
Admission: EM | Admit: 2021-12-24 | Discharge: 2022-01-02 | DRG: 871 | Disposition: A | Discharge status: Home Health | Payer: Medicare Other | Attending: Internal Medicine | Admitting: Internal Medicine

## 2021-12-24 ENCOUNTER — Encounter (HOSPITAL_COMMUNITY): Payer: Self-pay | Admitting: *Deleted

## 2021-12-24 ENCOUNTER — Other Ambulatory Visit: Payer: Self-pay

## 2021-12-24 ENCOUNTER — Emergency Department (HOSPITAL_COMMUNITY): Payer: Medicare Other

## 2021-12-24 DIAGNOSIS — N179 Acute kidney failure, unspecified: Secondary | ICD-10-CM | POA: Diagnosis not present

## 2021-12-24 DIAGNOSIS — Z6825 Body mass index (BMI) 25.0-25.9, adult: Secondary | ICD-10-CM | POA: Diagnosis not present

## 2021-12-24 DIAGNOSIS — R600 Localized edema: Secondary | ICD-10-CM | POA: Diagnosis not present

## 2021-12-24 DIAGNOSIS — Z96651 Presence of right artificial knee joint: Secondary | ICD-10-CM | POA: Diagnosis present

## 2021-12-24 DIAGNOSIS — S51811A Laceration without foreign body of right forearm, initial encounter: Secondary | ICD-10-CM | POA: Diagnosis present

## 2021-12-24 DIAGNOSIS — G9341 Metabolic encephalopathy: Secondary | ICD-10-CM

## 2021-12-24 DIAGNOSIS — I959 Hypotension, unspecified: Secondary | ICD-10-CM | POA: Diagnosis present

## 2021-12-24 DIAGNOSIS — I429 Cardiomyopathy, unspecified: Secondary | ICD-10-CM | POA: Diagnosis present

## 2021-12-24 DIAGNOSIS — J449 Chronic obstructive pulmonary disease, unspecified: Secondary | ICD-10-CM | POA: Diagnosis not present

## 2021-12-24 DIAGNOSIS — R0602 Shortness of breath: Secondary | ICD-10-CM | POA: Diagnosis not present

## 2021-12-24 DIAGNOSIS — N390 Urinary tract infection, site not specified: Secondary | ICD-10-CM | POA: Diagnosis not present

## 2021-12-24 DIAGNOSIS — I10 Essential (primary) hypertension: Secondary | ICD-10-CM | POA: Diagnosis not present

## 2021-12-24 DIAGNOSIS — K219 Gastro-esophageal reflux disease without esophagitis: Secondary | ICD-10-CM

## 2021-12-24 DIAGNOSIS — Z79899 Other long term (current) drug therapy: Secondary | ICD-10-CM

## 2021-12-24 DIAGNOSIS — L03113 Cellulitis of right upper limb: Secondary | ICD-10-CM | POA: Diagnosis present

## 2021-12-24 DIAGNOSIS — I482 Chronic atrial fibrillation, unspecified: Secondary | ICD-10-CM | POA: Diagnosis not present

## 2021-12-24 DIAGNOSIS — N10 Acute tubulo-interstitial nephritis: Secondary | ICD-10-CM

## 2021-12-24 DIAGNOSIS — I447 Left bundle-branch block, unspecified: Secondary | ICD-10-CM | POA: Diagnosis present

## 2021-12-24 DIAGNOSIS — Z8261 Family history of arthritis: Secondary | ICD-10-CM

## 2021-12-24 DIAGNOSIS — I4891 Unspecified atrial fibrillation: Secondary | ICD-10-CM | POA: Diagnosis not present

## 2021-12-24 DIAGNOSIS — A409 Streptococcal sepsis, unspecified: Secondary | ICD-10-CM | POA: Diagnosis present

## 2021-12-24 DIAGNOSIS — Z823 Family history of stroke: Secondary | ICD-10-CM | POA: Diagnosis not present

## 2021-12-24 DIAGNOSIS — I5033 Acute on chronic diastolic (congestive) heart failure: Secondary | ICD-10-CM | POA: Diagnosis not present

## 2021-12-24 DIAGNOSIS — J9601 Acute respiratory failure with hypoxia: Secondary | ICD-10-CM | POA: Diagnosis not present

## 2021-12-24 DIAGNOSIS — G43909 Migraine, unspecified, not intractable, without status migrainosus: Secondary | ICD-10-CM | POA: Diagnosis present

## 2021-12-24 DIAGNOSIS — Z7901 Long term (current) use of anticoagulants: Secondary | ICD-10-CM | POA: Diagnosis not present

## 2021-12-24 DIAGNOSIS — I340 Nonrheumatic mitral (valve) insufficiency: Secondary | ICD-10-CM | POA: Diagnosis not present

## 2021-12-24 DIAGNOSIS — E871 Hypo-osmolality and hyponatremia: Secondary | ICD-10-CM | POA: Diagnosis not present

## 2021-12-24 DIAGNOSIS — I6522 Occlusion and stenosis of left carotid artery: Secondary | ICD-10-CM | POA: Diagnosis not present

## 2021-12-24 DIAGNOSIS — Z299 Encounter for prophylactic measures, unspecified: Secondary | ICD-10-CM | POA: Diagnosis not present

## 2021-12-24 DIAGNOSIS — E860 Dehydration: Secondary | ICD-10-CM

## 2021-12-24 DIAGNOSIS — Z888 Allergy status to other drugs, medicaments and biological substances status: Secondary | ICD-10-CM | POA: Diagnosis not present

## 2021-12-24 DIAGNOSIS — A419 Sepsis, unspecified organism: Secondary | ICD-10-CM | POA: Diagnosis present

## 2021-12-24 DIAGNOSIS — E876 Hypokalemia: Secondary | ICD-10-CM | POA: Diagnosis not present

## 2021-12-24 DIAGNOSIS — J90 Pleural effusion, not elsewhere classified: Secondary | ICD-10-CM | POA: Diagnosis not present

## 2021-12-24 DIAGNOSIS — Z91141 Patient's other noncompliance with medication regimen due to financial hardship: Secondary | ICD-10-CM

## 2021-12-24 DIAGNOSIS — I4819 Other persistent atrial fibrillation: Secondary | ICD-10-CM | POA: Diagnosis present

## 2021-12-24 DIAGNOSIS — R6520 Severe sepsis without septic shock: Secondary | ICD-10-CM | POA: Diagnosis present

## 2021-12-24 DIAGNOSIS — E785 Hyperlipidemia, unspecified: Secondary | ICD-10-CM | POA: Diagnosis present

## 2021-12-24 DIAGNOSIS — I110 Hypertensive heart disease with heart failure: Secondary | ICD-10-CM | POA: Diagnosis present

## 2021-12-24 DIAGNOSIS — J189 Pneumonia, unspecified organism: Secondary | ICD-10-CM | POA: Diagnosis not present

## 2021-12-24 DIAGNOSIS — I700 Atherosclerosis of aorta: Secondary | ICD-10-CM | POA: Diagnosis not present

## 2021-12-24 DIAGNOSIS — M545 Low back pain, unspecified: Secondary | ICD-10-CM | POA: Diagnosis not present

## 2021-12-24 DIAGNOSIS — R0902 Hypoxemia: Secondary | ICD-10-CM | POA: Diagnosis not present

## 2021-12-24 DIAGNOSIS — I6529 Occlusion and stenosis of unspecified carotid artery: Secondary | ICD-10-CM | POA: Diagnosis not present

## 2021-12-24 HISTORY — DX: Paroxysmal atrial fibrillation: I48.0

## 2021-12-24 LAB — CBC WITH DIFFERENTIAL/PLATELET
Abs Immature Granulocytes: 0.38 10*3/uL — ABNORMAL HIGH (ref 0.00–0.07)
Basophils Absolute: 0.1 10*3/uL (ref 0.0–0.1)
Basophils Relative: 0 %
Eosinophils Absolute: 1.1 10*3/uL — ABNORMAL HIGH (ref 0.0–0.5)
Eosinophils Relative: 4 %
HCT: 37.2 % (ref 36.0–46.0)
Hemoglobin: 12.5 g/dL (ref 12.0–15.0)
Immature Granulocytes: 2 %
Lymphocytes Relative: 4 %
Lymphs Abs: 1 10*3/uL (ref 0.7–4.0)
MCH: 30.9 pg (ref 26.0–34.0)
MCHC: 33.6 g/dL (ref 30.0–36.0)
MCV: 92.1 fL (ref 80.0–100.0)
Monocytes Absolute: 1.9 10*3/uL — ABNORMAL HIGH (ref 0.1–1.0)
Monocytes Relative: 7 %
Neutro Abs: 21.2 10*3/uL — ABNORMAL HIGH (ref 1.7–7.7)
Neutrophils Relative %: 83 %
Platelets: 280 10*3/uL (ref 150–400)
RBC: 4.04 MIL/uL (ref 3.87–5.11)
RDW: 13.9 % (ref 11.5–15.5)
WBC: 25.7 10*3/uL — ABNORMAL HIGH (ref 4.0–10.5)
nRBC: 0 % (ref 0.0–0.2)

## 2021-12-24 LAB — COMPREHENSIVE METABOLIC PANEL WITH GFR
ALT: 14 U/L (ref 0–44)
AST: 22 U/L (ref 15–41)
Albumin: 3.5 g/dL (ref 3.5–5.0)
Alkaline Phosphatase: 44 U/L (ref 38–126)
Anion gap: 11 (ref 5–15)
BUN: 26 mg/dL — ABNORMAL HIGH (ref 8–23)
CO2: 18 mmol/L — ABNORMAL LOW (ref 22–32)
Calcium: 8.9 mg/dL (ref 8.9–10.3)
Chloride: 96 mmol/L — ABNORMAL LOW (ref 98–111)
Creatinine, Ser: 1.68 mg/dL — ABNORMAL HIGH (ref 0.44–1.00)
GFR, Estimated: 30 mL/min — ABNORMAL LOW
Glucose, Bld: 143 mg/dL — ABNORMAL HIGH (ref 70–99)
Potassium: 4.1 mmol/L (ref 3.5–5.1)
Sodium: 125 mmol/L — ABNORMAL LOW (ref 135–145)
Total Bilirubin: 0.9 mg/dL (ref 0.3–1.2)
Total Protein: 6.6 g/dL (ref 6.5–8.1)

## 2021-12-24 LAB — APTT: aPTT: 40 s — ABNORMAL HIGH (ref 24–36)

## 2021-12-24 LAB — LACTIC ACID, PLASMA: Lactic Acid, Venous: 2.3 mmol/L (ref 0.5–1.9)

## 2021-12-24 LAB — PROTIME-INR
INR: 1.4 — ABNORMAL HIGH (ref 0.8–1.2)
Prothrombin Time: 17.4 s — ABNORMAL HIGH (ref 11.4–15.2)

## 2021-12-24 MED ORDER — SODIUM CHLORIDE 0.9 % IV SOLN
2.0000 g | Freq: Once | INTRAVENOUS | Status: AC
  Administered 2021-12-24: 2 g via INTRAVENOUS
  Filled 2021-12-24: qty 20

## 2021-12-24 MED ORDER — LACTATED RINGERS IV BOLUS (SEPSIS)
2000.0000 mL | Freq: Once | INTRAVENOUS | Status: AC
  Administered 2021-12-24: 2000 mL via INTRAVENOUS

## 2021-12-24 MED ORDER — LACTATED RINGERS IV BOLUS (SEPSIS): 1000.0000 mL | Freq: Once | INTRAVENOUS | Status: DC

## 2021-12-24 MED ORDER — ACETAMINOPHEN 325 MG PO TABS
650.0000 mg | ORAL_TABLET | Freq: Once | ORAL | Status: AC
  Administered 2021-12-24: 650 mg via ORAL
  Filled 2021-12-24: qty 2

## 2021-12-24 NOTE — ED Triage Notes (Addendum)
Pt dx'd with UTI today at PCP.  Earlier today 102.4 fever.  Low BP all day per family member.  Pt had a hard getting her thoughts together and talking around 1730.  Pt able to answer questions and appropriately during triage. Took tylenol an hour ago.   Pt with cut to right forearm.

## 2021-12-24 NOTE — ED Notes (Signed)
Pt oxygen dropped to 83% on RA. Pt placed on 3L [place]

## 2021-12-24 NOTE — ED Provider Notes (Signed)
[facility] Provider Note   CSN: [id number] Arrival date & time: 12/24/21  2109     History  Chief Complaint  Patient presents with   Dysuria   Altered Mental Status    Lydia Graham is a 86 y.o. female.   Dysuria Altered Mental Status   Patient states last night she started having urinary urgency and frequency.  Patient had to get up several times during the night and was not able to get to the bathroom in time.  She went to her doctor's office today and was diagnosed with a urinary tract infection.  She was given a dose of Cipro.  Family noted that she was having more trouble gathering her thoughts today.  She did have a temperature at home up to 102.4.  Patient has not had any vomiting.  No coughing.  She does have some pain in her right arm as well related to her recent injury.  Patient states over the weekend she was accidentally hit in the arm with a doll which caused a superficial laceration in her right forearm.  Patient has noticed some persistent bruising and swelling since  Home Medications Prior to Admission medications   Medication Sig Start Date End Date Taking? Authorizing Provider  apixaban (ELIQUIS) 5 MG TABS tablet TAKE (1) TABLET TWICE DAILY. 08/15/21   Allred, Jeneen Rinks, MD  amiodarone (PACERONE) 200 MG tablet TAKE 1/2 TABLET EVERY OTHER DAY. 11/07/21   Allred, Jeneen Rinks, MD  amLODipine (NORVASC) 5 MG tablet TAKE 1 TABLET AT 8AM AND 1 TABLET AT 8PM. 09/25/21   Arnoldo Lenis, MD  Calcium Carbonate-Vitamin D (CALCIUM 600+D PO) Take 1 tablet by mouth daily.    [provider]  Camphor-Menthol-Methyl Sal (SALONPAS) 3.06-29-08 % PTCH Place 1 patch onto the skin at bedtime as needed (foot pain).    [provider]  estradiol (ESTRACE) 0.5 MG tablet Take 0.5 mg by mouth daily.  04/18/16   [provider]  fluticasone (FLONASE) 50 MCG/ACT nasal spray Place 2 sprays into both nostrils daily as needed for allergies.     [provider]  furosemide (LASIX) 20 MG tablet TAKE 1 TABLET BY MOUTH DAILY AS NEEDED FOR EDEMA (LEG SWELLING/SHORTNESS OF BREATH). 05/16/21   Allred, Jeneen Rinks, MD  Hypromellose (ARTIFICIAL TEARS OP) Place 2 drops into both eyes 2 (two) times daily as needed (for dry eyes).    [provider]  losartan (COZAAR) 50 MG tablet TAKE 1 TABLET AT 10 AM AND 1 TABLET AT 10 PM. 10/30/21   Branch, Alphonse Guild, MD  magnesium hydroxide (MILK OF MAGNESIA) 400 MG/5ML suspension Take 30 mLs by mouth daily as needed for mild constipation.    [provider]  metoprolol succinate (TOPROL-XL) 50 MG 24 hr tablet TAKE 1 TABLET AT LUNCH OR IMMEDIATELY FOLLOWING A MEAL. 11/07/21   Allred, Jeneen Rinks, MD  pantoprazole (PROTONIX) 40 MG tablet TAKE (1) TABLET TWICE DAILY. 09/25/21   Arnoldo Lenis, MD  potassium chloride SA (K-DUR,KLOR-CON) 20 MEQ tablet Take 1 tablet (20 mEq total) by mouth as needed (take on days you take your Furosemide). 07/29/18   Herminio Commons, MD  predniSONE (DELTASONE) 10 MG tablet Take 1 tablet (10 mg total) by mouth daily with breakfast. 10/08/21   [name], MD  vitamin B-12 (CYANOCOBALAMIN) 1000 MCG tablet Take 1,000 mcg by mouth daily.    [provider]      Allergies    Rosuvastatin, Lisinopril, Other, and Singulair [  montelukast sodium]    Review of Systems   Review of Systems  Genitourinary:  Positive for dysuria.    Physical Exam Updated Vital Signs BP (!) 115/52   Pulse 77   Temp (!) 101.5 F (38.6 C) (Oral)   Resp (!) 25   Ht 1.702 m ('5\' 7"'$ )   Wt 71.7 kg   LMP  (LMP Unknown)   SpO2 93%   BMI 24.75 kg/m  Physical Exam Vitals and nursing note reviewed.  Constitutional:      General: She is not in acute distress.    Appearance: She is well-developed.  HENT:     Head: Normocephalic and atraumatic.     Right Ear: External ear normal.     Left Ear: External ear normal.  Eyes:     General: No scleral icterus.       Right eye: No discharge.         Left eye: No discharge.     Conjunctiva/sclera: Conjunctivae normal.  Neck:     Trachea: No tracheal deviation.  Cardiovascular:     Rate and Rhythm: Normal rate and regular rhythm.  Pulmonary:     Effort: Pulmonary effort is normal. No respiratory distress.     Breath sounds: Normal breath sounds. No stridor. No wheezing or rales.  Abdominal:     General: Bowel sounds are normal. There is no distension.     Palpations: Abdomen is soft.     Tenderness: There is no abdominal tenderness. There is no guarding or rebound.  Musculoskeletal:        General: No tenderness or deformity.     Cervical back: Neck supple.  Skin:    General: Skin is warm and dry.     Findings: Erythema present. No rash.     Comments: Right forearm with contusion, superficial laceration, mild erythema  Neurological:     General: No focal deficit present.     Mental Status: She is alert.     Cranial Nerves: No cranial nerve deficit (no facial droop, extraocular movements intact, no slurred speech).     Sensory: No sensory deficit.     Motor: No abnormal muscle tone or seizure activity.     Coordination: Coordination normal.  Psychiatric:        Mood and Affect: Mood normal.     ED Results / Procedures / Treatments   Labs (all labs ordered are listed, but only abnormal results are displayed) Labs Reviewed  LACTIC ACID, PLASMA - Abnormal; Notable for the following components:      Result Value   Lactic Acid, Venous 2.3 (*)    All other components within normal limits  COMPREHENSIVE METABOLIC PANEL - Abnormal; Notable for the following components:   Sodium 125 (*)    Chloride 96 (*)    CO2 18 (*)    Glucose, Bld 143 (*)    BUN 26 (*)    Creatinine, Ser 1.68 (*)    GFR, Estimated 30 (*)    All other components within normal limits  CBC WITH DIFFERENTIAL/PLATELET - Abnormal; Notable for the following components:   WBC 25.7 (*)    Neutro Abs 21.2 (*)    Monocytes Absolute 1.9 (*)    Eosinophils  Absolute 1.1 (*)    Abs Immature Granulocytes 0.38 (*)    All other components within normal limits  PROTIME-INR - Abnormal; Notable for the following components:   Prothrombin Time 17.4 (*)    INR 1.4 (*)  All other components within normal limits  APTT - Abnormal; Notable for the following components:   aPTT 40 (*)    All other components within normal limits  CULTURE, BLOOD (ROUTINE X 2)  CULTURE, BLOOD (ROUTINE X 2)  URINE CULTURE  LACTIC ACID, PLASMA  URINALYSIS, ROUTINE W REFLEX MICROSCOPIC    EKG EKG Interpretation  Date/Time:  Monday December 24 2021 21:47:17 EDT Ventricular Rate:  84 PR Interval:  189 QRS Duration: 143 QT Interval:  384 QTC Calculation: 101 R Axis:   -35 Text Interpretation: Sinus rhythm Left bundle branch block No significant change since last tracing Confirmed by Dorie Rank [phone]) on 12/24/2021 10:12:02 PM  Radiology No results found.  Procedures .Critical Care  Performed by: Dorie Rank, MD Authorized by: Dorie Rank, MD   Critical care provider statement:    Critical care time (minutes):  30   Critical care was time spent personally by me on the following activities:  Development of treatment plan with patient or surrogate, discussions with consultants, evaluation of patient's response to treatment, examination of patient, ordering and review of laboratory studies, ordering and review of radiographic studies, ordering and performing treatments and interventions, pulse oximetry, re-evaluation of patient's condition and review of old charts     Medications Ordered in ED Medications  lactated ringers bolus 2,000 mL (2,000 mLs Intravenous New Bag/Given 12/24/21 2245)  cefTRIAXone (ROCEPHIN) 2 g in sodium chloride 0.9 % 100 mL IVPB (0 g Intravenous Stopped 12/24/21 2316)  acetaminophen (TYLENOL) tablet 650 mg (650 mg Oral Given 12/24/21 2239)    ED Course/ Medical Decision Making/ A&P Clinical Course as of 12/24/21 2344  Mon Dec 24, 2021  2321  Comprehensive metabolic panel(!) Metabolic panel shows hyponatremia as well as AKI [JK]  2322 Lactic acid, plasma(!!) Elevated lactic acid level [JK]  2341 DG Chest Port 1 View No acute pna [JK]    Clinical Course User Index [JK] Dorie Rank, MD                           Medical Decision Making Presentation concerning for possible evolving sepsis.  Patient noted to have fever and hypotension initially.  Repeat blood pressure normal.  Potential sources include urinary tract infection, pneumonia, cellulitis  Problems Addressed: AKI (acute kidney injury) ([place]): acute illness or injury that poses a threat to life or bodily functions Hyponatremia: acute illness or injury Sepsis, due to unspecified organism, unspecified whether acute organ dysfunction present [facility]): acute illness or injury that poses a threat to life or bodily functions  Amount and/or Complexity of Data Reviewed Labs: ordered. Decision-making details documented in ED Course. Radiology: ordered. ECG/medicine tests: ordered.  Risk OTC drugs. Drug therapy requiring intensive monitoring for toxicity. Decision regarding hospitalization.   Patient presents emergency room for evaluation of fever, confusion.  Patient is alert and oriented in the ED.  No confusion noted however she did have a temperature of 101.5.  Presentation concerning for the possibility of evolving sepsis.  Patient diagnosed with UTI as an outpatient.  Labs do show an elevated lactic acid level.  Patient also has significant leukocytosis and hyponatremia.  No pneumonia noted on x-ray although she had some transient oxygen requirements.  Patient has been started on IV Rocephin.  Plan on admission to the hospital for further treatment.        Final Clinical Impression(s) / ED Diagnoses Final diagnoses:  Hyponatremia  AKI (acute kidney injury) ([place])  Sepsis, due  to unspecified organism, unspecified whether acute organ dysfunction present [facility])    Rx /  DC Orders ED Discharge Orders     None         Dorie Rank, MD 12/24/21 2344

## 2021-12-25 DIAGNOSIS — I6522 Occlusion and stenosis of left carotid artery: Secondary | ICD-10-CM | POA: Diagnosis not present

## 2021-12-25 DIAGNOSIS — I4819 Other persistent atrial fibrillation: Secondary | ICD-10-CM | POA: Diagnosis present

## 2021-12-25 DIAGNOSIS — I700 Atherosclerosis of aorta: Secondary | ICD-10-CM | POA: Diagnosis not present

## 2021-12-25 DIAGNOSIS — I447 Left bundle-branch block, unspecified: Secondary | ICD-10-CM | POA: Diagnosis present

## 2021-12-25 DIAGNOSIS — L03113 Cellulitis of right upper limb: Secondary | ICD-10-CM | POA: Diagnosis present

## 2021-12-25 DIAGNOSIS — N10 Acute tubulo-interstitial nephritis: Secondary | ICD-10-CM | POA: Diagnosis present

## 2021-12-25 DIAGNOSIS — I10 Essential (primary) hypertension: Secondary | ICD-10-CM

## 2021-12-25 DIAGNOSIS — N179 Acute kidney failure, unspecified: Secondary | ICD-10-CM | POA: Diagnosis present

## 2021-12-25 DIAGNOSIS — R0902 Hypoxemia: Secondary | ICD-10-CM | POA: Diagnosis not present

## 2021-12-25 DIAGNOSIS — I429 Cardiomyopathy, unspecified: Secondary | ICD-10-CM | POA: Diagnosis present

## 2021-12-25 DIAGNOSIS — K219 Gastro-esophageal reflux disease without esophagitis: Secondary | ICD-10-CM

## 2021-12-25 DIAGNOSIS — Z96651 Presence of right artificial knee joint: Secondary | ICD-10-CM | POA: Diagnosis present

## 2021-12-25 DIAGNOSIS — J90 Pleural effusion, not elsewhere classified: Secondary | ICD-10-CM | POA: Diagnosis not present

## 2021-12-25 DIAGNOSIS — E860 Dehydration: Secondary | ICD-10-CM

## 2021-12-25 DIAGNOSIS — A409 Streptococcal sepsis, unspecified: Secondary | ICD-10-CM | POA: Diagnosis present

## 2021-12-25 DIAGNOSIS — Z888 Allergy status to other drugs, medicaments and biological substances status: Secondary | ICD-10-CM | POA: Diagnosis not present

## 2021-12-25 DIAGNOSIS — A419 Sepsis, unspecified organism: Secondary | ICD-10-CM | POA: Diagnosis present

## 2021-12-25 DIAGNOSIS — G9341 Metabolic encephalopathy: Secondary | ICD-10-CM

## 2021-12-25 DIAGNOSIS — I340 Nonrheumatic mitral (valve) insufficiency: Secondary | ICD-10-CM | POA: Diagnosis present

## 2021-12-25 DIAGNOSIS — J189 Pneumonia, unspecified organism: Secondary | ICD-10-CM | POA: Diagnosis not present

## 2021-12-25 DIAGNOSIS — E871 Hypo-osmolality and hyponatremia: Principal | ICD-10-CM

## 2021-12-25 DIAGNOSIS — R0602 Shortness of breath: Secondary | ICD-10-CM | POA: Diagnosis not present

## 2021-12-25 DIAGNOSIS — Z823 Family history of stroke: Secondary | ICD-10-CM | POA: Diagnosis not present

## 2021-12-25 DIAGNOSIS — I5033 Acute on chronic diastolic (congestive) heart failure: Secondary | ICD-10-CM | POA: Diagnosis not present

## 2021-12-25 DIAGNOSIS — I4891 Unspecified atrial fibrillation: Secondary | ICD-10-CM | POA: Diagnosis not present

## 2021-12-25 DIAGNOSIS — I482 Chronic atrial fibrillation, unspecified: Secondary | ICD-10-CM

## 2021-12-25 DIAGNOSIS — I959 Hypotension, unspecified: Secondary | ICD-10-CM | POA: Diagnosis present

## 2021-12-25 DIAGNOSIS — I6529 Occlusion and stenosis of unspecified carotid artery: Secondary | ICD-10-CM | POA: Diagnosis not present

## 2021-12-25 DIAGNOSIS — G43909 Migraine, unspecified, not intractable, without status migrainosus: Secondary | ICD-10-CM | POA: Diagnosis present

## 2021-12-25 DIAGNOSIS — E785 Hyperlipidemia, unspecified: Secondary | ICD-10-CM | POA: Diagnosis present

## 2021-12-25 DIAGNOSIS — J9601 Acute respiratory failure with hypoxia: Secondary | ICD-10-CM | POA: Diagnosis not present

## 2021-12-25 DIAGNOSIS — Z8261 Family history of arthritis: Secondary | ICD-10-CM | POA: Diagnosis not present

## 2021-12-25 DIAGNOSIS — J449 Chronic obstructive pulmonary disease, unspecified: Secondary | ICD-10-CM | POA: Diagnosis not present

## 2021-12-25 DIAGNOSIS — E876 Hypokalemia: Secondary | ICD-10-CM | POA: Diagnosis not present

## 2021-12-25 DIAGNOSIS — N390 Urinary tract infection, site not specified: Secondary | ICD-10-CM | POA: Diagnosis not present

## 2021-12-25 DIAGNOSIS — Z7901 Long term (current) use of anticoagulants: Secondary | ICD-10-CM | POA: Diagnosis not present

## 2021-12-25 DIAGNOSIS — I110 Hypertensive heart disease with heart failure: Secondary | ICD-10-CM | POA: Diagnosis present

## 2021-12-25 DIAGNOSIS — R600 Localized edema: Secondary | ICD-10-CM | POA: Diagnosis not present

## 2021-12-25 LAB — CBC WITH DIFFERENTIAL/PLATELET
Abs Immature Granulocytes: 0.36 10*3/uL — ABNORMAL HIGH (ref 0.00–0.07)
Basophils Absolute: 0.1 10*3/uL (ref 0.0–0.1)
Basophils Relative: 0 %
Eosinophils Absolute: 1.4 10*3/uL — ABNORMAL HIGH (ref 0.0–0.5)
Eosinophils Relative: 5 %
HCT: 35.7 % — ABNORMAL LOW (ref 36.0–46.0)
Hemoglobin: 11.8 g/dL — ABNORMAL LOW (ref 12.0–15.0)
Immature Granulocytes: 1 %
Lymphocytes Relative: 6 %
Lymphs Abs: 1.4 10*3/uL (ref 0.7–4.0)
MCH: 30.7 pg (ref 26.0–34.0)
MCHC: 33.1 g/dL (ref 30.0–36.0)
MCV: 93 fL (ref 80.0–100.0)
Monocytes Absolute: 1.4 10*3/uL — ABNORMAL HIGH (ref 0.1–1.0)
Monocytes Relative: 6 %
Neutro Abs: 21 10*3/uL — ABNORMAL HIGH (ref 1.7–7.7)
Neutrophils Relative %: 82 %
Platelets: 252 10*3/uL (ref 150–400)
RBC: 3.84 MIL/uL — ABNORMAL LOW (ref 3.87–5.11)
RDW: 14.2 % (ref 11.5–15.5)
WBC: 25.6 10*3/uL — ABNORMAL HIGH (ref 4.0–10.5)
nRBC: 0 % (ref 0.0–0.2)

## 2021-12-25 LAB — URINALYSIS, ROUTINE W REFLEX MICROSCOPIC
Bacteria, UA: NONE SEEN
Bilirubin Urine: NEGATIVE
Glucose, UA: NEGATIVE mg/dL
Ketones, ur: NEGATIVE mg/dL
Leukocytes,Ua: NEGATIVE
Nitrite: NEGATIVE
Protein, ur: NEGATIVE mg/dL
Specific Gravity, Urine: 1.013 (ref 1.005–1.030)
pH: 6 (ref 5.0–8.0)

## 2021-12-25 LAB — COMPREHENSIVE METABOLIC PANEL
ALT: 12 U/L (ref 0–44)
AST: 20 U/L (ref 15–41)
Albumin: 2.9 g/dL — ABNORMAL LOW (ref 3.5–5.0)
Alkaline Phosphatase: 44 U/L (ref 38–126)
Anion gap: 8 (ref 5–15)
BUN: 21 mg/dL (ref 8–23)
CO2: 20 mmol/L — ABNORMAL LOW (ref 22–32)
Calcium: 8.4 mg/dL — ABNORMAL LOW (ref 8.9–10.3)
Chloride: 102 mmol/L (ref 98–111)
Creatinine, Ser: 1.3 mg/dL — ABNORMAL HIGH (ref 0.44–1.00)
GFR, Estimated: 41 mL/min — ABNORMAL LOW (ref >60)
Glucose, Bld: 114 mg/dL — ABNORMAL HIGH (ref 70–99)
Potassium: 3.9 mmol/L (ref 3.5–5.1)
Sodium: 130 mmol/L — ABNORMAL LOW (ref 135–145)
Total Bilirubin: 0.7 mg/dL (ref 0.3–1.2)
Total Protein: 5.7 g/dL — ABNORMAL LOW (ref 6.5–8.1)

## 2021-12-25 LAB — LACTIC ACID, PLASMA: Lactic Acid, Venous: 1.8 mmol/L (ref 0.5–1.9)

## 2021-12-25 MED ORDER — AMIODARONE HCL 200 MG PO TABS
200.0000 mg | ORAL_TABLET | ORAL | Status: DC
Start: 2021-12-25 — End: 2021-12-26
  Administered 2021-12-25: 200 mg via ORAL
  Filled 2021-12-25: qty 1

## 2021-12-25 MED ORDER — METOPROLOL TARTRATE 5 MG/5ML IV SOLN
5.0000 mg | INTRAVENOUS | Status: DC | PRN
  Administered 2021-12-25 – 2021-12-27 (×8): 5 mg via INTRAVENOUS
  Filled 2021-12-25 (×8): qty 5

## 2021-12-25 MED ORDER — ACETAMINOPHEN 650 MG RE SUPP: 650.0000 mg | Freq: Four times a day (QID) | RECTAL | Status: DC | PRN

## 2021-12-25 MED ORDER — AMLODIPINE BESYLATE 5 MG PO TABS
5.0000 mg | ORAL_TABLET | Freq: Every day | ORAL | Status: DC
  Administered 2021-12-25: 5 mg via ORAL
  Filled 2021-12-25: qty 1

## 2021-12-25 MED ORDER — ACETAMINOPHEN 325 MG PO TABS
650.0000 mg | ORAL_TABLET | Freq: Four times a day (QID) | ORAL | Status: DC | PRN
  Administered 2021-12-26 – 2021-12-30 (×8): 650 mg via ORAL
  Filled 2021-12-25 (×3): qty 2

## 2021-12-25 MED ORDER — ONDANSETRON HCL 4 MG PO TABS: 4.0000 mg | ORAL_TABLET | Freq: Four times a day (QID) | ORAL | Status: DC | PRN

## 2021-12-25 MED ORDER — ENOXAPARIN SODIUM 30 MG/0.3ML IJ SOSY: 30.0000 mg | PREFILLED_SYRINGE | INTRAMUSCULAR | Status: DC

## 2021-12-25 MED ORDER — METOPROLOL SUCCINATE ER 50 MG PO TB24
50.0000 mg | ORAL_TABLET | Freq: Every day | ORAL | Status: DC
Start: 2021-12-25 — End: 2021-12-27
  Administered 2021-12-25 – 2021-12-27 (×3): 50 mg via ORAL
  Filled 2021-12-25 (×3): qty 1

## 2021-12-25 MED ORDER — ONDANSETRON HCL 4 MG/2ML IJ SOLN
4.0000 mg | Freq: Four times a day (QID) | INTRAMUSCULAR | Status: DC | PRN
  Administered 2021-12-27: 4 mg via INTRAVENOUS
  Filled 2021-12-25: qty 2

## 2021-12-25 MED ORDER — PANTOPRAZOLE SODIUM 40 MG PO TBEC
40.0000 mg | DELAYED_RELEASE_TABLET | Freq: Every day | ORAL | Status: DC
  Administered 2021-12-25 – 2022-01-02 (×9): 40 mg via ORAL
  Filled 2021-12-25 (×9): qty 1

## 2021-12-25 MED ORDER — ACETAMINOPHEN 325 MG PO TABS
650.0000 mg | ORAL_TABLET | Freq: Four times a day (QID) | ORAL | Status: DC | PRN
  Administered 2021-12-25 – 2021-12-30 (×4): 650 mg via ORAL
  Filled 2021-12-25 (×9): qty 2

## 2021-12-25 MED ORDER — APIXABAN 5 MG PO TABS
5.0000 mg | ORAL_TABLET | Freq: Two times a day (BID) | ORAL | Status: DC
  Administered 2021-12-25 – 2022-01-02 (×17): 5 mg via ORAL
  Filled 2021-12-25 (×17): qty 1

## 2021-12-25 MED ORDER — SODIUM CHLORIDE 0.9 % IV SOLN
1.0000 g | INTRAVENOUS | Status: DC
  Administered 2021-12-25: 1 g via INTRAVENOUS
  Filled 2021-12-25: qty 10

## 2021-12-25 MED ORDER — LACTATED RINGERS IV SOLN: INTRAVENOUS | Status: DC

## 2021-12-25 NOTE — Progress Notes (Signed)
Nurse tried to stick pt for IV when the pt started saying" I'm going out" and then stopped talking." Nurse called for help and pt came back to and was alert and oriented. MD courage Bedside, Vitals obtained and WDL. Pt stated that "everything went black and I passed out and also she was scared of needles". Primary Doctor Notified.

## 2021-12-25 NOTE — Progress Notes (Signed)
OT Cancellation Note  Patient Details Name: Lydia Graham MRN: [id number] DOB: 1935/07/11   Cancelled Treatment:    Reason Eval/Treat Not Completed: Medical issues which prohibited therapy. Nsg staff requesting rehab hold eval for now as pt in RVR and working to control. Will attempt tomorrow.     Guadelupe Sabin, OTR/L  [phone] 12/25/2021, 11:01 AM

## 2021-12-25 NOTE — H&P (Signed)
History and Physical    Patient: Lydia Graham DZH:[id number] DOB: 09-Apr-1936 DOA: 12/24/2021 DOS: the patient was seen and examined on 12/25/2021 PCP: Glenda Chroman, MD  Patient coming from: Home  Chief Complaint:  Chief Complaint  Patient presents with   Dysuria   Altered Mental Status   HPI: [place] is a 86 y.o. female with medical history significant of hypertension, A-fib on Eliquis, GERD who presents to the emergency department due to altered mental status.  Daughter at bedside provided most of the history.  Per report, patient complained of 1 day onset of urinary frequency and urgency whereby she went to the bathroom several times in the course of the night.  During the day, she went to her PCP's office and she was diagnosed to have UTI, Cipro was prescribed and she took 1 on getting home.  However, later in the day, she was noted to have increased confusion whereby she had difficulty in being able to get her thoughts together, temperature was checked and it was 102.49F, daughter notified patient's sister who was a Designer, jewellery and she asked daughter to take patient to the ER.  She complained of superficial laceration of her right forearm which occurred when she was accidentally hit in the arm.  She denies chest pain, shortness of breath, vomiting, diarrhea, abdominal pain   ED Course:  In the emergency department, she was febrile with a temperature of 101.33F and tachypneic, BP was 133/59, other vital signs are within normal range.  Work-up in the ED showed normal CBC except for leukocytosis BMP showed hyponatremia, potassium 4.1, bicarb was 143, BUN/creatinine 26/1.68 (most recent creatinine for comparison was 3 years ago at 1.13).  Lactic acid 1.8, urinalysis was normal.  Blood culture showed no growth in less than 12 hours. Chest x-ray showed mild, stable bilateral atelectatic changes She was treated with IV ceftriaxone, Tylenol was given due to fever and IV hydration  was provided.  Review of Systems: Review of systems as noted in the HPI. All other systems reviewed and are negative.   Past Medical History:  Diagnosis Date   Arthritis    oa, all over - multiple areas    Carotid artery occlusion    Dysrhythmia    AFib    GERD (gastroesophageal reflux disease)    Headache    h/o migraines    Hypertension    Past Surgical History:  Procedure Laterality Date   ABDOMINAL HYSTERECTOMY     APPENDECTOMY     BACK SURGERY     CARDIOVERSION N/A 12/19/2017   Procedure: CARDIOVERSION;  Surgeon: Arnoldo Lenis, MD;  [facility];  Service: Endoscopy;  Laterality: N/A;   CARDIOVERSION N/A 12/26/2017   Procedure: CARDIOVERSION;  Surgeon: [name], MD;  Location: [place];  Service: Cardiovascular;  Laterality: N/A;   CARDIOVERSION N/A 01/26/2018   Procedure: CARDIOVERSION;  Surgeon: Jerline Pain, MD;  Location: [facility] ENDOSCOPY;  Service: Cardiovascular;  Laterality: N/A;   CAROTID ENDARTERECTOMY     CATARACT EXTRACTION W/PHACO Right 03/09/2021   Procedure: CATARACT EXTRACTION PHACO AND INTRAOCULAR LENS PLACEMENT with Placement of Corticosteroid ([place]);  Surgeon: [name], MD;  Location: [facility];  Service: Ophthalmology;  Laterality: Right;  CDE 9.16   CATARACT EXTRACTION W/PHACO Left 04/02/2021   Procedure: CATARACT EXTRACTION PHACO AND INTRAOCULAR LENS PLACEMENT LEFT EYE;  Surgeon: [name], MD;  Location: [facility];  Service: Ophthalmology;  Laterality: Left;  left CDE=6.75   ENDARTERECTOMY Left 03/27/2017  Procedure: ENDARTERECTOMY CAROTID LEFT;  Surgeon: [name], MD;  Location: [place];  Service: Vascular;  Laterality: Left;   INNER EAR SURGERY Right    puntured eardrum- repaired 2x's    KNEE ARTHROPLASTY     NASAL SINUS SURGERY     deviated septum   PATCH ANGIOPLASTY Left 03/27/2017   Procedure: PATCH ANGIOPLASTY LEFT CAROTID ARTERY USING Rueben Bash BIOLOGIC PATCH;  Surgeon: [name], MD;   Location: [place];  Service: Vascular;  Laterality: Left;   TONSILLECTOMY     TOTAL KNEE ARTHROPLASTY Right 10/23/2016   Procedure: RIGHT TOTAL KNEE ARTHROPLASTY;  Surgeon: [name], MD;  Location: [place];  Service: Orthopedics;  Laterality: Right;    Social History:  reports that she has never smoked. She has never used smokeless tobacco. She reports that she does not drink alcohol and does not use drugs.   Allergies  Allergen Reactions   Rosuvastatin Other (See Comments)    MYALGIA, Muscle pain   Lisinopril Cough   Other Other (See Comments)    PAIN MEDICATIONS/ANESTHESIA  MADE BP DROP LOW    Singulair [Montelukast Sodium] Other (See Comments)    fatigue    Family History  Problem Relation Age of Onset   Stroke Mother    Rheum arthritis Sister      Prior to Admission medications   Medication Sig Start Date End Date Taking? Authorizing Provider  apixaban (ELIQUIS) 5 MG TABS tablet TAKE (1) TABLET TWICE DAILY. 08/15/21   Allred, Jeneen Rinks, MD  amiodarone (PACERONE) 200 MG tablet TAKE 1/2 TABLET EVERY OTHER DAY. 11/07/21   Allred, Jeneen Rinks, MD  amLODipine (NORVASC) 5 MG tablet TAKE 1 TABLET AT 8AM AND 1 TABLET AT 8PM. 09/25/21   Arnoldo Lenis, MD  Calcium Carbonate-Vitamin D (CALCIUM 600+D PO) Take 1 tablet by mouth daily.    [provider]  Camphor-Menthol-Methyl Sal (SALONPAS) 3.06-29-08 % PTCH Place 1 patch onto the skin at bedtime as needed (foot pain).    [provider]  estradiol (ESTRACE) 0.5 MG tablet Take 0.5 mg by mouth daily.  04/18/16   [provider]  fluticasone (FLONASE) 50 MCG/ACT nasal spray Place 2 sprays into both nostrils daily as needed for allergies.     [provider]  furosemide (LASIX) 20 MG tablet TAKE 1 TABLET BY MOUTH DAILY AS NEEDED FOR EDEMA (LEG SWELLING/SHORTNESS OF BREATH). 05/16/21   Allred, Jeneen Rinks, MD  Hypromellose (ARTIFICIAL TEARS OP) Place 2 drops into both eyes 2 (two) times daily as needed (for dry eyes).     [provider]  losartan (COZAAR) 50 MG tablet TAKE 1 TABLET AT 10 AM AND 1 TABLET AT 10 PM. 10/30/21   Branch, Alphonse Guild, MD  magnesium hydroxide (MILK OF MAGNESIA) 400 MG/5ML suspension Take 30 mLs by mouth daily as needed for mild constipation.    [provider]  metoprolol succinate (TOPROL-XL) 50 MG 24 hr tablet TAKE 1 TABLET AT LUNCH OR IMMEDIATELY FOLLOWING A MEAL. 11/07/21   Allred, Jeneen Rinks, MD  pantoprazole (PROTONIX) 40 MG tablet TAKE (1) TABLET TWICE DAILY. 09/25/21   Arnoldo Lenis, MD  potassium chloride SA (K-DUR,KLOR-CON) 20 MEQ tablet Take 1 tablet (20 mEq total) by mouth as needed (take on days you take your Furosemide). 07/29/18   Herminio Commons, MD  predniSONE (DELTASONE) 10 MG tablet Take 1 tablet (10 mg total) by mouth daily with breakfast. 10/08/21   [name], MD  vitamin B-12 (CYANOCOBALAMIN) 1000 MCG  tablet Take 1,000 mcg by mouth daily.    [provider]    Physical Exam: BP 132/69 (BP Location: Left Arm)   Pulse 95   Temp 99.1 F (37.3 C) (Oral)   Resp 15   Ht 5' 7" (1.702 m)   Wt 73.7 kg   LMP  (LMP Unknown)   SpO2 94%   BMI 25.45 kg/m   General: 86 y.o. year-old female well developed well nourished in no acute distress.  Alert and oriented x3. HEENT: NCAT, EOMI, dry mucous membrane Neck: Supple, trachea medial Cardiovascular: Regular rate and rhythm with no rubs or gallops.  No thyromegaly or JVD noted.  No lower extremity edema. 2/4 pulses in all 4 extremities. Respiratory: Clear to auscultation with no wheezes or rales. Good inspiratory effort. Abdomen: Soft, nontender nondistended with normal bowel sounds x4 quadrants. Muskuloskeletal: No cyanosis, clubbing or edema noted bilaterally Neuro: CN II-XII intact, strength 5/5 x 4, sensation, reflexes intact Skin: No noted rashes Psychiatry:  Mood is appropriate for condition and setting          Labs on Admission:  Basic Metabolic Panel: Recent Labs  Lab  12/24/21 2158 12/25/21 0711  NA 125* 130*  K 4.1 3.9  CL 96* 102  CO2 18* 20*  GLUCOSE 143* 114*  BUN 26* 21  CREATININE 1.68* 1.30*  CALCIUM 8.9 8.4*   Liver Function Tests: Recent Labs  Lab 12/24/21 2158 12/25/21 0711  AST 22 20  ALT 14 12  ALKPHOS 44 44  BILITOT 0.9 0.7  PROT 6.6 5.7*  ALBUMIN 3.5 2.9*   No results for input(s): "LIPASE", "AMYLASE" in the last 168 hours. No results for input(s): "AMMONIA" in the last 168 hours. CBC: Recent Labs  Lab 12/24/21 2158 12/25/21 0711  WBC 25.7* 25.6*  NEUTROABS 21.2* 21.0*  HGB 12.5 11.8*  HCT 37.2 35.7*  MCV 92.1 93.0  PLT 280 252   Cardiac Enzymes: No results for input(s): "CKTOTAL", "CKMB", "CKMBINDEX", "TROPONINI" in the last 168 hours.  BNP (last 3 results) No results for input(s): "BNP" in the last 8760 hours.  ProBNP (last 3 results) No results for input(s): "PROBNP" in the last 8760 hours.  CBG: No results for input(s): "GLUCAP" in the last 168 hours.  Radiological Exams on Admission: DG Chest Port 1 View  Result Date: 12/24/2021 CLINICAL DATA:  Questionable sepsis with low blood pressure. EXAM: PORTABLE CHEST 1 VIEW COMPARISON:  December 23, 2017 FINDINGS: The heart size and mediastinal contours are within normal limits. There is mild calcification of the aortic arch. Low lung volumes are seen with diffuse, chronic appearing increased interstitial lung markings. Mild, stable linear scarring and/or atelectasis is seen within the mid to lower left lung and bilateral lung bases. A chronic appearing deformity is seen along the lateral aspect of the sixth left rib. Multilevel degenerative changes seen throughout the thoracic spine. IMPRESSION: Mild, stable bilateral atelectatic changes. Electronically Signed   By: Virgina Norfolk M.D.   On: 12/24/2021 22:26    EKG: I independently viewed the EKG done and my findings are as followed: Normal sinus rhythm at a rate of 84 bpm with LBBB.  Assessment/Plan Present on  Admission: **None**  Active Problems:   Acute pyelonephritis   Acute metabolic encephalopathy   Sepsis (HCC)   AKI (acute kidney injury) ([place])   Hyponatremia   Dehydration   Atrial fibrillation, chronic (HCC)   Essential hypertension   GERD (gastroesophageal reflux disease)  Acute metabolic encephalopathy due to sepsis  secondary to acute pyelonephritis Patient met sepsis criteria on arrival to the ED due to fever and tachypnea with source of infection being UTI. She was started on IV ceftriaxone, we shall continue same at this time Continue Tylenol as needed Acute metabolic encephalopathy has improved since arrival to the ED Urine culture and blood culture pending  Acute kidney injury Dehydration BUN/creatinine 26/1.68 (most recent creatinine for comparison was 3 years ago at 1.13) Continue gentle hydration Renally adjust medications, avoid nephrotoxic agents/dehydration/hypotension  Hyponatremia Na 125 > 130, this may be due to dehydration Continue IV hydration  Right forearm laceration Consult wound care  Essential hypertension (controlled) Continue amlodipine and Toprol-XL Losartan will be held at this time due to acute kidney injury  A-fib on Eliquis Continue Eliquis, amiodarone and Toprol-XL  GERD Continue Protonix  DVT prophylaxis: Eliquis   Advance Care Planning:   Code Status: Full Code   Consults: None  Family Communication: None at bedside  Severity of Illness: The appropriate patient status for this patient is INPATIENT. Inpatient status is judged to be reasonable and necessary in order to provide the required intensity of service to ensure the patient's safety. The patient's presenting symptoms, physical exam findings, and initial radiographic and laboratory data in the context of their chronic comorbidities is felt to place them at high risk for further clinical deterioration. Furthermore, it is not anticipated that the patient will be medically  stable for discharge from the hospital within 2 midnights of admission.   * I certify that at the point of admission it is my clinical judgment that the patient will require inpatient hospital care spanning beyond 2 midnights from the point of admission due to high intensity of service, high risk for further deterioration and high frequency of surveillance required.*  Author: Bernadette Hoit, DO 12/25/2021 8:12 AM  For on call review [email or web address].

## 2021-12-25 NOTE — Progress Notes (Signed)
PROGRESS NOTE        PATIENT DETAILS Name: Lydia Graham Age: 86 y.o. Sex: female Date of Birth: February 08, 1936 Admit Date: 12/24/2021 Admitting Physician Bernadette Hoit, DO BWI:OMBT, Costella Hatcher, MD  Brief Summary: Patient is a 86 y.o.  female with history of persistent atrial fibrillation, GERD who was recently diagnosed with UTI-and started on Cipro (took for 1 day)-she was brought to the hospital for fever, altered mental status-and subsequently admitted to the hospitalist service.  Presented to the hospital with   Significant events: 7/3>> diagnosed with UTI as outpatient-started on Cipro-but developed confusion-admit to [facility]  Significant studies: 7/3>> no obvious PNA  Significant microbiology data: 7/3>> blood culture: No growth 7/4>> urine culture: Pending  Procedures: None  Consults: None  Subjective: Lying comfortably in bed-denies any chest pain or shortness of breath.  Completely awake and alert.  After I saw her-she apparently went into A-fib RVR.  Objective: Vitals: Blood pressure 132/69, pulse 95, temperature 99.1 F (37.3 C), temperature source Oral, resp. rate 15, height '5\' 7"'$  (1.702 m), weight 73.7 kg, SpO2 94 %.   Exam: Gen Exam:Alert awake-not in any distress HEENT:atraumatic, normocephalic Chest: B/L clear to auscultation anteriorly CVS:S1S2 regular Abdomen:soft non tender, non distended.  Mild bilateral CVA tenderness. Extremities:no edema Neurology: Non focal Skin: no rash  Pertinent Labs/Radiology:    Latest Ref Rng & Units 12/25/2021    7:11 AM 12/24/2021    9:58 PM 01/19/2018   12:50 PM  CBC  WBC 4.0 - 10.5 K/uL 25.6  25.7  8.4   Hemoglobin 12.0 - 15.0 g/dL 11.8  12.5  15.4   Hematocrit 36.0 - 46.0 % 35.7  37.2  46.1   Platelets 150 - 400 K/uL 252  280  252     Lab Results  Component Value Date   NA 130 (L) 12/25/2021   K 3.9 12/25/2021   CL 102 12/25/2021   CO2 20 (L) 12/25/2021       Assessment/Plan: Sepsis due to acute pyelonephritis: Sepsis physiology improving-continue IV Rocephin-await cultures (but may be sterile as patient was already on Cipro).  Acute metabolic encephalopathy: Due to sepsis/pyelonephritis-resolved-completely awake and alert.  AKI: Hemodynamically mediated-in the setting of sepsis-improving with supportive care.  Hyponatremia: Due to dehydration-improving with IVF.  Persistent A-fib with RVR: Heart rate in the 150s-160s (after my evaluation)-unfortunately due to nausea/vomiting in the past 1 to 2 days-she was not taking all of her medications-heart rate better controlled after 1 dose of IV Lopressor-she has already received amiodarone, Toprol-XL Eliquis-we will watch her closely.  If heart rate is persistently elevated-we will place on amiodarone infusion.  HTN: BP soft this morning-continue Toprol-XL-continue to hold amlodipine and losartan.    GERD: Continue PPI  Right forearm laceration: Following accidental drop of a American doll on her arm (several days back)-appears superficial-await wound care  BMI: Estimated body mass index is 25.45 kg/m as calculated from the following:   Height as of this encounter: '5\' 7"'$  (1.702 m).   Weight as of this encounter: 73.7 kg.   Code status:   Code Status: Full Code   DVT Prophylaxis: SCDs Start: 12/25/21 0759 apixaban (ELIQUIS) tablet 5 mg    Family Communication: Daughter-Lydia [phone] updated over the phone   Disposition Plan: Status is: Inpatient Remains inpatient appropriate because: Resolving sepsis physiology-A-fib RVR-noted stable for discharge.   Planned  Discharge Destination:Home   Diet: Diet Order             Diet heart healthy/carb modified Room service appropriate? Yes; Fluid consistency: Thin  Diet effective now                     Antimicrobial agents: Anti-infectives (From admission, onward)    Start     Dose/Rate Route Frequency Ordered Stop    12/25/21 2200  cefTRIAXone (ROCEPHIN) 1 g in sodium chloride 0.9 % 100 mL IVPB        1 g 200 mL/hr over 30 Minutes Intravenous Every 24 hours 12/25/21 0749     12/24/21 2215  cefTRIAXone (ROCEPHIN) 2 g in sodium chloride 0.9 % 100 mL IVPB        2 g 200 mL/hr over 30 Minutes Intravenous  Once 12/24/21 2207 12/24/21 2316        MEDICATIONS: Scheduled Meds:  amiodarone  200 mg Oral QODAY   amLODipine  5 mg Oral Daily   apixaban  5 mg Oral BID   metoprolol succinate  50 mg Oral Daily   pantoprazole  40 mg Oral Daily   Continuous Infusions:  cefTRIAXone (ROCEPHIN)  IV     lactated ringers 125 mL/hr at 12/25/21 0554   PRN Meds:.acetaminophen **OR** acetaminophen, acetaminophen, ondansetron **OR** ondansetron (ZOFRAN) IV   I have personally reviewed following labs and imaging studies  LABORATORY DATA: CBC: Recent Labs  Lab 12/24/21 2158 12/25/21 0711  WBC 25.7* 25.6*  NEUTROABS 21.2* 21.0*  HGB 12.5 11.8*  HCT 37.2 35.7*  MCV 92.1 93.0  PLT 280 539    Basic Metabolic Panel: Recent Labs  Lab 12/24/21 2158 12/25/21 0711  NA 125* 130*  K 4.1 3.9  CL 96* 102  CO2 18* 20*  GLUCOSE 143* 114*  BUN 26* 21  CREATININE 1.68* 1.30*  CALCIUM 8.9 8.4*    GFR: Estimated Creatinine Clearance: 31.3 mL/min (A) (by C-G formula based on SCr of 1.3 mg/dL (H)).  Liver Function Tests: Recent Labs  Lab 12/24/21 2158 12/25/21 0711  AST 22 20  ALT 14 12  ALKPHOS 44 44  BILITOT 0.9 0.7  PROT 6.6 5.7*  ALBUMIN 3.5 2.9*   No results for input(s): "LIPASE", "AMYLASE" in the last 168 hours. No results for input(s): "AMMONIA" in the last 168 hours.  Coagulation Profile: Recent Labs  Lab 12/24/21 2158  INR 1.4*    Cardiac Enzymes: No results for input(s): "CKTOTAL", "CKMB", "CKMBINDEX", "TROPONINI" in the last 168 hours.  BNP (last 3 results) No results for input(s): "PROBNP" in the last 8760 hours.  Lipid Profile: No results for input(s): "CHOL", "HDL", "LDLCALC",  "TRIG", "CHOLHDL", "LDLDIRECT" in the last 72 hours.  Thyroid Function Tests: No results for input(s): "TSH", "T4TOTAL", "FREET4", "T3FREE", "THYROIDAB" in the last 72 hours.  Anemia Panel: No results for input(s): "VITAMINB12", "FOLATE", "FERRITIN", "TIBC", "IRON", "RETICCTPCT" in the last 72 hours.  Urine analysis:    Component Value Date/Time   COLORURINE YELLOW 12/25/2021 0115   APPEARANCEUR CLEAR 12/25/2021 0115   LABSPEC 1.013 12/25/2021 0115   PHURINE 6.0 12/25/2021 0115   GLUCOSEU NEGATIVE 12/25/2021 0115   HGBUR SMALL (A) 12/25/2021 0115   BILIRUBINUR NEGATIVE 12/25/2021 0115   KETONESUR NEGATIVE 12/25/2021 0115   PROTEINUR NEGATIVE 12/25/2021 0115   NITRITE NEGATIVE 12/25/2021 0115   LEUKOCYTESUR NEGATIVE 12/25/2021 0115    Sepsis Labs: Lactic Acid, Venous    Component Value Date/Time   LATICACIDVEN 1.8 12/25/2021  0016    MICROBIOLOGY: Recent Results (from the past 240 hour(s))  Blood Culture (routine x 2)     Status: None (Preliminary result)   Collection Time: 12/24/21  9:58 PM   Specimen: BLOOD LEFT WRIST  Result Value Ref Range Status   Specimen Description BLOOD LEFT WRIST  Final   Special Requests   Final    BOTTLES DRAWN AEROBIC AND ANAEROBIC Blood Culture adequate volume   Culture   Final    NO GROWTH < 12 HOURS Performed at [facility], [address], [place], [place] [address]    Report Status PENDING  Incomplete  Blood Culture (routine x 2)     Status: None (Preliminary result)   Collection Time: 12/24/21  9:58 PM   Specimen: BLOOD RIGHT FOREARM  Result Value Ref Range Status   Specimen Description BLOOD RIGHT FOREARM  Final   Special Requests   Final    BOTTLES DRAWN AEROBIC AND ANAEROBIC Blood Culture adequate volume   Culture   Final    NO GROWTH < 12 HOURS Performed at [facility], [address] [place], [place] [address]    Report Status PENDING  Incomplete    RADIOLOGY STUDIES/RESULTS: DG Chest Port 1 View  Result Date:  12/24/2021 CLINICAL DATA:  Questionable sepsis with low blood pressure. EXAM: PORTABLE CHEST 1 VIEW COMPARISON:  December 23, 2017 FINDINGS: The heart size and mediastinal contours are within normal limits. There is mild calcification of the aortic arch. Low lung volumes are seen with diffuse, chronic appearing increased interstitial lung markings. Mild, stable linear scarring and/or atelectasis is seen within the mid to lower left lung and bilateral lung bases. A chronic appearing deformity is seen along the lateral aspect of the sixth left rib. Multilevel degenerative changes seen throughout the thoracic spine. IMPRESSION: Mild, stable bilateral atelectatic changes. Electronically Signed   By: Virgina Norfolk M.D.   On: 12/24/2021 22:26     LOS: 0 days   Oren Binet, MD  [facility]    To contact the attending provider between 7A-7P or the covering provider during after hours 7P-7A, please log into the web site [email or web address] and access using universal [place] password for that web site. If you do not have the password, please call the hospital operator.  12/25/2021, 8:13 AM

## 2021-12-25 NOTE — Progress Notes (Signed)
CCMD called and informed nurse that Pt was in Afib in the 160s. Vitals obtained EKG obtained and in chart. CCMD called back to inform nurse Pt was back In NSR. Morning medications was given at 0912 including amiodarone that pt stated she had not taken medication in a few days. MD Shanker  paged via [place]. See new orders.

## 2021-12-25 NOTE — Progress Notes (Signed)
   12/25/21 2314  Assess: MEWS Score  Temp 98.2 F (36.8 C)  BP 111/64  MAP (mmHg) 76  Pulse Rate (!) 145  Resp 20  Level of Consciousness Alert  SpO2 95 %  O2 Device Nasal Cannula  O2 Flow Rate (L/min) 3 L/min  Assess: MEWS Score  MEWS Temp 0  MEWS Systolic 0  MEWS Pulse 3  MEWS RR 0  MEWS LOC 0  MEWS Score 3  MEWS Score Color Yellow  Assess: if the MEWS score is Yellow or Red  Were vital signs taken at a resting state? Yes  Focused Assessment No change from prior assessment  Does the patient meet 2 or more of the SIRS criteria? No  MEWS guidelines implemented *See Row Information* Yes  Treat  MEWS Interventions Administered prn meds/treatments  Pain Scale 0-10  Pain Score Asleep  Notify: Charge Nurse/RN  Name of Charge Nurse/RN Notified Bree, RN  Date Charge Nurse/RN Notified 12/25/21  Time Charge Nurse/RN Notified 2335  Notify: Provider  Provider Name/Title Dr. Josephine Cables  Date Provider Notified 12/25/21  Time Provider Notified 2333  Method of Notification  (Secure chat)  Notification Reason Other (Comment) (Afib and increased heart rate of 140s)  Assess: SIRS CRITERIA  SIRS Temperature  0  SIRS Pulse 1  SIRS Respirations  0  SIRS WBC 0  SIRS Score Sum  1   Patients Heart rate running 145 on telemetry without coming down, Administered PRN Metoprolol '5mg'$  IV. Heart rate came down to 99 and then went back up to 108. Dr. Josephine Cables and Charge nurse Lawernce Keas, RN notified. No New orders at this time

## 2021-12-25 NOTE — TOC Progression Note (Signed)
  Transition of Care [facility]) Screening Note   Patient Details  Name: Lydia Graham Date of Birth: 1935-07-05   Transition of Care [facility]) CM/SW Contact:    Boneta Lucks, RN Phone Number: 12/25/2021, 8:59 AM    Transition of Care Department [facility]) has reviewed patient and no TOC needs have been identified at this time. We will continue to monitor patient advancement through interdisciplinary progression rounds. If new patient transition needs arise, please place a TOC consult.    Expected Discharge Plan: [place]    Expected Discharge Plan and Services Expected Discharge Plan: [place]       Living arrangements for the past 2 months: [place]

## 2021-12-26 ENCOUNTER — Encounter (HOSPITAL_COMMUNITY): Payer: Self-pay | Admitting: Internal Medicine

## 2021-12-26 ENCOUNTER — Inpatient Hospital Stay (HOSPITAL_COMMUNITY): Payer: Medicare Other

## 2021-12-26 DIAGNOSIS — I340 Nonrheumatic mitral (valve) insufficiency: Secondary | ICD-10-CM

## 2021-12-26 DIAGNOSIS — I482 Chronic atrial fibrillation, unspecified: Secondary | ICD-10-CM | POA: Diagnosis not present

## 2021-12-26 DIAGNOSIS — I10 Essential (primary) hypertension: Secondary | ICD-10-CM

## 2021-12-26 DIAGNOSIS — I6529 Occlusion and stenosis of unspecified carotid artery: Secondary | ICD-10-CM

## 2021-12-26 DIAGNOSIS — A419 Sepsis, unspecified organism: Secondary | ICD-10-CM | POA: Diagnosis present

## 2021-12-26 DIAGNOSIS — G9341 Metabolic encephalopathy: Secondary | ICD-10-CM | POA: Diagnosis not present

## 2021-12-26 DIAGNOSIS — N390 Urinary tract infection, site not specified: Secondary | ICD-10-CM | POA: Diagnosis present

## 2021-12-26 DIAGNOSIS — N179 Acute kidney failure, unspecified: Secondary | ICD-10-CM | POA: Diagnosis not present

## 2021-12-26 LAB — URINE CULTURE: Culture: NO GROWTH

## 2021-12-26 LAB — CBC
HCT: 32.3 % — ABNORMAL LOW (ref 36.0–46.0)
Hemoglobin: 10.5 g/dL — ABNORMAL LOW (ref 12.0–15.0)
MCH: 30.3 pg (ref 26.0–34.0)
MCHC: 32.5 g/dL (ref 30.0–36.0)
MCV: 93.4 fL (ref 80.0–100.0)
Platelets: 220 10*3/uL (ref 150–400)
RBC: 3.46 MIL/uL — ABNORMAL LOW (ref 3.87–5.11)
RDW: 14.1 % (ref 11.5–15.5)
WBC: 18.8 10*3/uL — ABNORMAL HIGH (ref 4.0–10.5)
nRBC: 0 % (ref 0.0–0.2)

## 2021-12-26 LAB — BASIC METABOLIC PANEL
Anion gap: 7 (ref 5–15)
BUN: 14 mg/dL (ref 8–23)
CO2: 22 mmol/L (ref 22–32)
Calcium: 8.1 mg/dL — ABNORMAL LOW (ref 8.9–10.3)
Chloride: 102 mmol/L (ref 98–111)
Creatinine, Ser: 0.99 mg/dL (ref 0.44–1.00)
GFR, Estimated: 56 mL/min — ABNORMAL LOW (ref >60)
Glucose, Bld: 115 mg/dL — ABNORMAL HIGH (ref 70–99)
Potassium: 3.7 mmol/L (ref 3.5–5.1)
Sodium: 131 mmol/L — ABNORMAL LOW (ref 135–145)

## 2021-12-26 MED ORDER — VANCOMYCIN HCL 1250 MG/250ML IV SOLN
1250.0000 mg | INTRAVENOUS | Status: DC
  Administered 2021-12-27 – 2022-01-01 (×6): 1250 mg via INTRAVENOUS
  Filled 2021-12-26 (×6): qty 250

## 2021-12-26 MED ORDER — AMIODARONE HCL IN DEXTROSE 360-4.14 MG/200ML-% IV SOLN
60.0000 mg/h | INTRAVENOUS | Status: AC
  Administered 2021-12-26 (×2): 60 mg/h via INTRAVENOUS
  Filled 2021-12-26 (×3): qty 200

## 2021-12-26 MED ORDER — CHLORHEXIDINE GLUCONATE CLOTH 2 % EX PADS
6.0000 | MEDICATED_PAD | Freq: Every day | CUTANEOUS | Status: DC
  Administered 2021-12-26 – 2022-01-01 (×7): 6 via TOPICAL

## 2021-12-26 MED ORDER — ESTRADIOL 1 MG PO TABS
0.5000 mg | ORAL_TABLET | Freq: Every day | ORAL | Status: DC
  Administered 2021-12-26 – 2022-01-02 (×8): 0.5 mg via ORAL
  Filled 2021-12-26 (×8): qty 0.5
  Filled 2021-12-26 (×2): qty 1
  Filled 2021-12-26: qty 0.5
  Filled 2021-12-26: qty 1
  Filled 2021-12-26: qty 0.5

## 2021-12-26 MED ORDER — SODIUM CHLORIDE 0.9 % IV SOLN
2.0000 g | Freq: Once | INTRAVENOUS | Status: AC
  Administered 2021-12-26: 2 g via INTRAVENOUS
  Filled 2021-12-26: qty 12.5

## 2021-12-26 MED ORDER — MORPHINE SULFATE (PF) 2 MG/ML IV SOLN
2.0000 mg | Freq: Once | INTRAVENOUS | Status: AC
  Administered 2021-12-26: 2 mg via INTRAVENOUS
  Filled 2021-12-26: qty 1

## 2021-12-26 MED ORDER — AMIODARONE HCL IN DEXTROSE 360-4.14 MG/200ML-% IV SOLN
30.0000 mg/h | INTRAVENOUS | Status: DC
  Administered 2021-12-26 – 2021-12-30 (×7): 30 mg/h via INTRAVENOUS
  Filled 2021-12-26 (×7): qty 200

## 2021-12-26 MED ORDER — VANCOMYCIN HCL 1500 MG/300ML IV SOLN
1500.0000 mg | Freq: Once | INTRAVENOUS | Status: AC
  Administered 2021-12-26: 1500 mg via INTRAVENOUS
  Filled 2021-12-26: qty 300

## 2021-12-26 MED ORDER — VANCOMYCIN HCL IN DEXTROSE 1-5 GM/200ML-% IV SOLN: 1000.0000 mg | Freq: Once | INTRAVENOUS | Status: DC

## 2021-12-26 MED ORDER — SODIUM CHLORIDE 0.9 % IV SOLN
2.0000 g | Freq: Two times a day (BID) | INTRAVENOUS | Status: DC
  Administered 2021-12-26 – 2021-12-29 (×6): 2 g via INTRAVENOUS
  Filled 2021-12-26 (×6): qty 12.5

## 2021-12-26 MED ORDER — AMIODARONE HCL 200 MG PO TABS: 200.0000 mg | ORAL_TABLET | Freq: Every day | ORAL | Status: DC

## 2021-12-26 NOTE — Progress Notes (Signed)
   12/26/21 1040  Vitals  Temp 98.1 F (36.7 C)  Temp Source Oral  BP 125/69  MAP (mmHg) 80  Pulse Rate (!) 120  Resp 20  Level of Consciousness  Level of Consciousness Alert  MEWS COLOR  MEWS Score Color Yellow  Oxygen Therapy  SpO2 94 %  O2 Device Nasal Cannula  O2 Flow Rate (L/min) 2 L/min  MEWS Score  MEWS Temp 0  MEWS Systolic 0  MEWS Pulse 2  MEWS RR 0  MEWS LOC 0  MEWS Score 2  Provider Notification  Provider Name/Title Kathie Dike  Date Provider Notified 12/26/21  Time Provider Notified 1042  Method of Notification Page  Notification Reason Other (Comment)  Provider response No new orders  Date of Provider Response 12/26/21  Time of Provider Response 1042   PRN medication given, Pt transfer order for ICU is in awaiting a bed.

## 2021-12-26 NOTE — Progress Notes (Signed)
PROGRESS NOTE        PATIENT DETAILS Name: Lydia Graham Age: 86 y.o. Sex: female Date of Birth: 20-Mar-1936 Admit Date: 12/24/2021 Admitting Physician Bernadette Hoit, DO NOM:VEHM, Costella Hatcher, MD  Brief Summary: Patient is a 86 y.o.  female with history of persistent atrial fibrillation, GERD who was recently diagnosed with UTI-and started on Cipro (took for 1 day)-she was brought to the hospital for fever, altered mental status-and subsequently admitted to the hospitalist service.  She was started on IV antibiotics and IV fluids. She developed rapid atrial fibrillation. Cardiology consulted.    Significant events: 7/3>> diagnosed with UTI as outpatient-started on Cipro-but developed confusion-admit to [facility] 7/5>>transfer to SDU for rapid A fib. Cardiology consulted  Significant studies: 7/3>> no obvious PNA  Significant microbiology data: 7/3>> blood culture: No growth 7/4>> urine culture: Pending 7/5>>wound culture  Procedures: None  Consults: cardiology  Subjective: Worsening pain and swelling of her right arm  Objective: Vitals: Blood pressure 125/69, pulse (!) 120, temperature 98.1 F (36.7 C), temperature source Oral, resp. rate 20, height '5\' 7"'$  (1.702 m), weight 73.7 kg, SpO2 94 %.   Exam: Gen Exam:Alert awake-not in any distress HEENT:atraumatic, normocephalic Chest: B/L clear to auscultation anteriorly CVS:S1S2 irregular Abdomen:soft non tender, non distended.  Mild bilateral CVA tenderness. Extremities:right forearm is erythematous, swollen with wound draining clear fluid Neurology: Non focal Skin: no rash  Pertinent Labs/Radiology:    Latest Ref Rng & Units 12/26/2021    4:52 AM 12/25/2021    7:11 AM 12/24/2021    9:58 PM  CBC  WBC 4.0 - 10.5 K/uL 18.8  25.6  25.7   Hemoglobin 12.0 - 15.0 g/dL 10.5  11.8  12.5   Hematocrit 36.0 - 46.0 % 32.3  35.7  37.2   Platelets 150 - 400 K/uL 220  252  280     Lab Results  Component  Value Date   NA 131 (L) 12/26/2021   K 3.7 12/26/2021   CL 102 12/26/2021   CO2 22 12/26/2021      Assessment/Plan: Sepsis due to acute pyelonephritis/cellulitis:  -Sepsis physiology improving -continue IV antibiotics -await cultures (but may be sterile as patient was already on Cipro). -checking wound culture of right forearm today  Acute metabolic encephalopathy: Due to sepsis/pyelonephritis-resolved-completely awake and alert.  AKI: Hemodynamically mediated-in the setting of sepsis-improving with IV fluids.  Hyponatremia: Due to dehydration-improving with IVF.  Persistent A-fib with RVR:  -developed RVR with HR in 150s yesterday, responding to IV lopressor -currently HR between 110-130 -she is on home dose of toprol, amiodarone -cardiology consulted -anticoagulated with eliquis  HTN: currently on Toprol-XL -continue to hold amlodipine and losartan to allow for further rate control.    GERD: Continue PPI  Right forearm laceration/cellulitis: Following accidental drop of a American doll on her arm (several days back) -woc for dressings -check ultrasound to eval for underlying fluid collections -broaden antibiotic coverage since erythema appears to have extended beyond margins from yesterdy -keep elevated -wound culture  BMI: Estimated body mass index is 25.45 kg/m as calculated from the following:   Height as of this encounter: '5\' 7"'$  (1.702 m).   Weight as of this encounter: 73.7 kg.   Code status:   Code Status: Full Code   DVT Prophylaxis: SCDs Start: 12/25/21 0759 apixaban (ELIQUIS) tablet 5 mg    Family  Communication: Daughter updated at the bedside   Disposition Plan: Status is: Inpatient Remains inpatient appropriate because: Resolving sepsis physiology-A-fib RVR-noted    Planned Discharge Destination:Home   Diet: Diet Order             Diet heart healthy/carb modified Room service appropriate? Yes; Fluid consistency: Thin  Diet effective now                      Antimicrobial agents: Anti-infectives (From admission, onward)    Start     Dose/Rate Route Frequency Ordered Stop   12/27/21 1200  vancomycin (VANCOREADY) IVPB 1250 mg/250 mL        1,250 mg 166.7 mL/hr over 90 Minutes Intravenous Every 24 hours 12/26/21 1052     12/26/21 2300  ceFEPIme (MAXIPIME) 2 g in sodium chloride 0.9 % 100 mL IVPB        2 g 200 mL/hr over 30 Minutes Intravenous Every 12 hours 12/26/21 1052     12/26/21 1145  vancomycin (VANCOCIN) IVPB 1000 mg/200 mL premix  Status:  Discontinued        1,000 mg 200 mL/hr over 60 Minutes Intravenous  Once 12/26/21 1046 12/26/21 1050   12/26/21 1145  vancomycin (VANCOREADY) IVPB 1500 mg/300 mL        1,500 mg 150 mL/hr over 120 Minutes Intravenous  Once 12/26/21 1050     12/26/21 1130  ceFEPIme (MAXIPIME) 2 g in sodium chloride 0.9 % 100 mL IVPB        2 g 200 mL/hr over 30 Minutes Intravenous  Once 12/26/21 1046     12/25/21 2200  cefTRIAXone (ROCEPHIN) 1 g in sodium chloride 0.9 % 100 mL IVPB  Status:  Discontinued        1 g 200 mL/hr over 30 Minutes Intravenous Every 24 hours 12/25/21 0749 12/26/21 1046   12/24/21 2215  cefTRIAXone (ROCEPHIN) 2 g in sodium chloride 0.9 % 100 mL IVPB        2 g 200 mL/hr over 30 Minutes Intravenous  Once 12/24/21 2207 12/24/21 2316        MEDICATIONS: Scheduled Meds:  amiodarone  200 mg Oral QODAY   apixaban  5 mg Oral BID   metoprolol succinate  50 mg Oral Daily   pantoprazole  40 mg Oral Daily   Continuous Infusions:  ceFEPime (MAXIPIME) IV     ceFEPime (MAXIPIME) IV     lactated ringers 100 mL/hr at 12/26/21 1054   [START ON 12/27/2021] vancomycin     vancomycin     PRN Meds:.acetaminophen **OR** acetaminophen, acetaminophen, metoprolol tartrate, ondansetron **OR** ondansetron (ZOFRAN) IV   I have personally reviewed following labs and imaging studies  LABORATORY DATA: CBC: Recent Labs  Lab 12/24/21 2158 12/25/21 0711 12/26/21 0452  WBC  25.7* 25.6* 18.8*  NEUTROABS 21.2* 21.0*  --   HGB 12.5 11.8* 10.5*  HCT 37.2 35.7* 32.3*  MCV 92.1 93.0 93.4  PLT 280 252 106    Basic Metabolic Panel: Recent Labs  Lab 12/24/21 2158 12/25/21 0711 12/26/21 0452  NA 125* 130* 131*  K 4.1 3.9 3.7  CL 96* 102 102  CO2 18* 20* 22  GLUCOSE 143* 114* 115*  BUN 26* 21 14  CREATININE 1.68* 1.30* 0.99  CALCIUM 8.9 8.4* 8.1*    GFR: Estimated Creatinine Clearance: 41.1 mL/min (by C-G formula based on SCr of 0.99 mg/dL).  Liver Function Tests: Recent Labs  Lab 12/24/21 2158 12/25/21 0711  AST  22 20  ALT 14 12  ALKPHOS 44 44  BILITOT 0.9 0.7  PROT 6.6 5.7*  ALBUMIN 3.5 2.9*   No results for input(s): "LIPASE", "AMYLASE" in the last 168 hours. No results for input(s): "AMMONIA" in the last 168 hours.  Coagulation Profile: Recent Labs  Lab 12/24/21 2158  INR 1.4*    Cardiac Enzymes: No results for input(s): "CKTOTAL", "CKMB", "CKMBINDEX", "TROPONINI" in the last 168 hours.  BNP (last 3 results) No results for input(s): "PROBNP" in the last 8760 hours.  Lipid Profile: No results for input(s): "CHOL", "HDL", "LDLCALC", "TRIG", "CHOLHDL", "LDLDIRECT" in the last 72 hours.  Thyroid Function Tests: No results for input(s): "TSH", "T4TOTAL", "FREET4", "T3FREE", "THYROIDAB" in the last 72 hours.  Anemia Panel: No results for input(s): "VITAMINB12", "FOLATE", "FERRITIN", "TIBC", "IRON", "RETICCTPCT" in the last 72 hours.  Urine analysis:    Component Value Date/Time   COLORURINE YELLOW 12/25/2021 0115   APPEARANCEUR CLEAR 12/25/2021 0115   LABSPEC 1.013 12/25/2021 0115   PHURINE 6.0 12/25/2021 0115   GLUCOSEU NEGATIVE 12/25/2021 0115   HGBUR SMALL (A) 12/25/2021 0115   BILIRUBINUR NEGATIVE 12/25/2021 0115   KETONESUR NEGATIVE 12/25/2021 0115   PROTEINUR NEGATIVE 12/25/2021 0115   NITRITE NEGATIVE 12/25/2021 0115   LEUKOCYTESUR NEGATIVE 12/25/2021 0115    Sepsis Labs: Lactic Acid, Venous    Component Value  Date/Time   LATICACIDVEN 1.8 12/25/2021 0016    MICROBIOLOGY: Recent Results (from the past 240 hour(s))  Blood Culture (routine x 2)     Status: None (Preliminary result)   Collection Time: 12/24/21  9:58 PM   Specimen: BLOOD LEFT WRIST  Result Value Ref Range Status   Specimen Description BLOOD LEFT WRIST  Final   Special Requests   Final    BOTTLES DRAWN AEROBIC AND ANAEROBIC Blood Culture adequate volume   Culture   Final    NO GROWTH 2 DAYS Performed at [facility], [address], [place], [place] [address]    Report Status PENDING  Incomplete  Blood Culture (routine x 2)     Status: None (Preliminary result)   Collection Time: 12/24/21  9:58 PM   Specimen: BLOOD RIGHT FOREARM  Result Value Ref Range Status   Specimen Description BLOOD RIGHT FOREARM  Final   Special Requests   Final    BOTTLES DRAWN AEROBIC AND ANAEROBIC Blood Culture adequate volume   Culture   Final    NO GROWTH 2 DAYS Performed at [facility], [address], [place], [place] [address]    Report Status PENDING  Incomplete  Urine Culture     Status: None   Collection Time: 12/25/21  1:15 AM   Specimen: Urine, Clean Catch  Result Value Ref Range Status   Specimen Description   Final    URINE, CLEAN CATCH Performed at [facility]: [place] [address], [address] [place], [place] [address]    Special Requests   Final    NONE Performed at [facility], [address] [place], [place] [address]    Culture   Final    NO GROWTH Performed at [facility], [place] [address] [place], [place] [address]    Report Status 12/26/2021 FINAL  Final    RADIOLOGY STUDIES/RESULTS: DG Chest Port 1 View  Result Date: 12/24/2021 CLINICAL DATA:  Questionable sepsis with low blood pressure. EXAM: PORTABLE CHEST 1 VIEW COMPARISON:  December 23, 2017 FINDINGS: The heart size and mediastinal contours are within normal limits. There is mild calcification of the  aortic arch. Low lung volumes are seen with diffuse, chronic appearing  increased interstitial lung markings. Mild, stable linear scarring and/or atelectasis is seen within the mid to lower left lung and bilateral lung bases. A chronic appearing deformity is seen along the lateral aspect of the sixth left rib. Multilevel degenerative changes seen throughout the thoracic spine. IMPRESSION: Mild, stable bilateral atelectatic changes. Electronically Signed   By: Virgina Norfolk M.D.   On: 12/24/2021 22:26     LOS: 1 day   Kathie Dike, MD  [facility]    To contact the attending provider between 7A-7P or the covering provider during after hours 7P-7A, please log into the web site [email or web address] and access using universal [place] password for that web site. If you do not have the password, please call the hospital operator.  12/26/2021, 11:00 AM

## 2021-12-26 NOTE — Progress Notes (Addendum)
   12/26/21 0331  Vitals  Temp 98.1 F (36.7 C)  Temp Source Oral  BP 131/74  MAP (mmHg) 89  BP Location Left Arm  BP Method Automatic  Patient Position (if appropriate) Lying  Pulse Rate (!) 115  Resp (!) 21  MEWS COLOR  MEWS Score Color Yellow  Oxygen Therapy  SpO2 94 %  O2 Device Nasal Cannula  O2 Flow Rate (L/min) 2 L/min  MEWS Score  MEWS Temp 0  MEWS Systolic 0  MEWS Pulse 2  MEWS RR 1  MEWS LOC 0  MEWS Score 3  Provider Notification  Provider Name/Title Dr. Josephine Cables  Date Provider Notified 12/26/21  Time Provider Notified 2703  Method of Notification  (Secure chat)  Notification Reason Other (Comment) (No change, Pulse remains running 115-129s.)  Provider response No new orders  Date of Provider Response 12/26/21  Time of Provider Response 0351   Telemetry called patient heat rate reached 145. Went to check patient, patient had just go back in bed from going to the bath room with assist of CNA.Patients pulse 115, BP 131/74. Patient given Tylenol for pain and Metoprolol to decrease pulse that began to spike back up in 120-130s. Vitals rechecked after administration of Metoprolol '5mg'$  IV BP 133/76, Pulse 104, o2 94%, RR 20 temp 97.7.  MD and Charge nurse aware of patients current vitals and recheck. Continuing to monitor closely.

## 2021-12-26 NOTE — Progress Notes (Signed)
PT Cancellation Note  Patient Details Name: Lydia Graham MRN: [id number] DOB: [birth year]   Cancelled Treatment:    Reason Eval/Treat Not Completed: Medical issues which prohibited therapy.  Patient transferred to a higher level of care and will need new PT consult to resume therapy when patient is medically stable.  Thank you.    1:40 PM, 12/26/21 Lonell Grandchild, MPT Physical Therapist with [facility] [phone] office [phone] mobile phone

## 2021-12-26 NOTE — TOC Progression Note (Signed)
Transition of Care [facility]) - Progression Note    Patient Details  Name: Lydia Graham MRN: [id number] Date of Birth: 24-Apr-1936  Transition of Care [facility]) CM/SW Contact  Boneta Lucks, RN Phone Number: 12/26/2021, 11:40 AM  Clinical Narrative:   TOC at the bedside to offer home health. Her daughter is at the bedside. Patient declined, she is very active with her church and has family members in the medical field that will be checking in on her.   Expected Discharge Plan: [place]    Expected Discharge Plan and Services Expected Discharge Plan: [place]       Living arrangements for the past 2 months: [place]

## 2021-12-26 NOTE — Consult Note (Signed)
[facility] Nurse wound consult note Consultation was completed by review of records, images and assistance from the bedside nurse/clinical staff.  Reason for Consult: arm wound with oozing Wound type: trauma; patient injured with toy  Pressure Injury POA: NA Measurement: see nursing flow sheet  Wound bed:50% pink/50% fibrinous  Drainage (amount, consistency, odor) serous Periwound: intact  Dressing procedure/placement/frequency:  Clean arm wound with saline, pat dry Cover with single layer of xeroform for antiseptic properties, top with dry dressing. Change every other day.    Re consult if needed, will not follow at this time. Thanks  Dymond Gutt [employer], RN,CWOCN, CNS, [place] [phone])

## 2021-12-26 NOTE — Progress Notes (Signed)
   12/26/21 1040  Vitals  Temp 98.1 F (36.7 C)  Temp Source Oral  BP 125/69  MAP (mmHg) 80  Pulse Rate (!) 120  Resp 20  Level of Consciousness  Level of Consciousness Alert  MEWS COLOR  MEWS Score Color Yellow  Oxygen Therapy  SpO2 94 %  O2 Device Nasal Cannula  O2 Flow Rate (L/min) 2 L/min  MEWS Score  MEWS Temp 0  MEWS Systolic 0  MEWS Pulse 2  MEWS RR 0  MEWS LOC 0  MEWS Score 2  Provider Notification  Provider Name/Title Kathie Dike  Date Provider Notified 12/26/21  Time Provider Notified 1042  Method of Notification Page  Notification Reason Other (Comment)  Provider response No new orders  Date of Provider Response 12/26/21  Time of Provider Response 1042

## 2021-12-26 NOTE — Progress Notes (Signed)
Patient has orders to be transferred to ICU due to pulse has been running 120-145 during the night. Currently Per [facility] RN, ICU does not have an open bed currently. This nurse has closely monitored the patient and getting vital per yellow mews protocol. Metoprolol '5mg'$  IV has been given twice during this shift. With the heart rate fluctuating between 95-116 at 0430. Dr Josephine Cables has been updated with each set of vitals, and charge nurse Lawernce Keas aware of patients status.

## 2021-12-26 NOTE — Progress Notes (Signed)
OT Cancellation Note  Patient Details Name: Lydia Graham MRN: [id number] DOB: Aug 14, 1935   Cancelled Treatment:    Reason Eval/Treat Not Completed: Medical issues which prohibited therapy. Patient transferred to a higher level of care and will need new OT consult to resume therapy when patient is medically stable.  Thank you.  Khayden Herzberg OT, MOT   Larey Seat 12/26/2021, 3:10 PM

## 2021-12-26 NOTE — Progress Notes (Signed)
Pharmacy Antibiotic Note  KYNLEI PIONTEK a 86 y.o. female admitted on 12/26/2021 with cellulitis.  Pharmacy has been consulted for vancomycin and cefepime dosing.  Plan: Vancomycin '1250mg'$  IV every 24 hours.  Goal trough 10-15 mcg/mL. Cefepime 2gm IV every 12 hours.  Medical History: Past Medical History:  Diagnosis Date   Arthritis    oa, all over - multiple areas    Carotid artery occlusion    Dysrhythmia    AFib    GERD (gastroesophageal reflux disease)    Headache    h/o migraines    Hypertension     Allergies:  Allergies  Allergen Reactions   Rosuvastatin Other (See Comments)    MYALGIA, Muscle pain   Lisinopril Cough   Other Other (See Comments)    PAIN MEDICATIONS/ANESTHESIA  MADE BP DROP LOW    Singulair [Montelukast Sodium] Other (See Comments)    fatigue    Filed Weights   12/24/21 2122 12/25/21 0528  Weight: 71.7 kg (158 lb) 73.7 kg (162 lb 7.7 oz)       Latest Ref Rng & Units 12/26/2021    4:52 AM 12/25/2021    7:11 AM 12/24/2021    9:58 PM  CBC  WBC 4.0 - 10.5 K/uL 18.8  25.6  25.7   Hemoglobin 12.0 - 15.0 g/dL 10.5  11.8  12.5   Hematocrit 36.0 - 46.0 % 32.3  35.7  37.2   Platelets 150 - 400 K/uL 220  252  280      Estimated Creatinine Clearance: 41.1 mL/min (by C-G formula based on SCr of 0.99 mg/dL).  Antibiotics Given (last 72 hours)     Date/Time Action Medication Dose Rate   12/24/21 2246 New Bag/Given   cefTRIAXone (ROCEPHIN) 2 g in sodium chloride 0.9 % 100 mL IVPB 2 g 200 mL/hr   12/25/21 2111 New Bag/Given   cefTRIAXone (ROCEPHIN) 1 g in sodium chloride 0.9 % 100 mL IVPB 1 g 200 mL/hr       Antimicrobials this admission:  Cefepime 12/26/2021  >>  vancomycin 12/26/2021  >>   Microbiology results: 12/26/2021  BCx: sent 12/26/2021  UCx: sent 12/26/2021  Resp Panel: sent  12/26/2021  MRSA PCR: sent  Thank you for allowing pharmacy to be a part of this patient's care.  Thomasenia Sales, PharmD Clinical Pharmacist

## 2021-12-26 NOTE — Progress Notes (Signed)
Wound culture collected and sent to Lab. PRN pain medication administered before collecting. Wound cleansed and dressed as ordered.

## 2021-12-26 NOTE — Consult Note (Addendum)
Cardiology Consultation:   Patient ID: Lydia Graham MRN: [id number]; DOB: [birth year]  Admit date: 12/24/2021 Date of Consult: 12/26/2021  PCP:  Glenda Chroman, MD   [place] [facility] Cardiologist:  [name], MD  Electrophysiologist:  [name], MD       Patient Profile:   Lydia Graham is a 86 y.o. female with a hx of persistent atrial fibrillation (on Amiodarone as prior unsuccessful DCCV without antiarrhythmic therapy), carotid artery stenosis (s/p L CEA), mitral regurgitation, HTN and HLD who is being seen 12/26/2021 for the evaluation of atrial fibrillation with RVR at the request of [name].  History of Present Illness:   Lydia Graham was last examined by [name] in 07/2021 and denied any recent chest pain or palpitations at that time. She was continued on Amiodarone 100 mg every other day along with Amlodipine 5 mg twice daily, Eliquis 5 mg twice daily, Losartan 50 mg twice daily and Toprol-XL 50 mg daily.  She presented to [facility] ED on 12/24/2021 for evaluation of worsening fever and AMS following recent diagnosis of UTI by her PCP. Initial labs showed WBC 25.7, Hgb 12.5, platelets 280, Na+ 125, K+ 4.1 and creatinine 1.68 (previously 1.1 - 1.2 in 2019 and 1.16 in 07/2020). Lactic Acid 2.3. CXR showed mild stable atelectatic changes. Initial EKG showed NSR, HR 84 with known LBBB.   She was admitted for Urosepsis and started on IV Ceftriaxone. Yesterday morning, she was noted to be in atrial fibrillation with RVR. Reported she had perhaps missed some of her home medications prior to admission due to nausea and vomiting.  In talking with the and patient and her daughter today, she was initially having worsening dysuria and episodes of confusion prior to admission which prompted her daughter to bring her to the emergency room for evaluation. Also reports being hit along the right forearm by a doll when playing with a family member and has since developed  erythema and drainage along the area. She went into atrial fibrillation yesterday and says she has overall been asymptomatic with this. Previously had significant symptoms in the past. No specific chest pain or palpitations. Breathing at baseline. No recent orthopnea, PND or pitting edema.   Past Medical History:  Diagnosis Date   Arthritis    oa, all over - multiple areas    Carotid artery occlusion    Dysrhythmia    AFib    GERD (gastroesophageal reflux disease)    Headache    h/o migraines    Hypertension    PAF (paroxysmal atrial fibrillation) ([place])    a. multiple DCCV's in 2019 and eventually required initiation of Amiodarone with successful DCCV after this. b. recurrent in 12/2021 while admitted for Urosepsis    Past Surgical History:  Procedure Laterality Date   ABDOMINAL HYSTERECTOMY     APPENDECTOMY     BACK SURGERY     CARDIOVERSION N/A 12/19/2017   Procedure: CARDIOVERSION;  Surgeon: Arnoldo Lenis, MD;  [facility];  Service: Endoscopy;  Laterality: N/A;   CARDIOVERSION N/A 12/26/2017   Procedure: CARDIOVERSION;  Surgeon: [name], MD;  Location: [facility];  Service: Cardiovascular;  Laterality: N/A;   CARDIOVERSION N/A 01/26/2018   Procedure: CARDIOVERSION;  Surgeon: Jerline Pain, MD;  Location: [facility] ENDOSCOPY;  Service: Cardiovascular;  Laterality: N/A;   CAROTID ENDARTERECTOMY     CATARACT EXTRACTION W/PHACO Right 03/09/2021   Procedure: CATARACT EXTRACTION PHACO AND INTRAOCULAR LENS PLACEMENT with Placement of Corticosteroid (  [place]);  Surgeon: [name], MD;  Location: [facility];  Service: Ophthalmology;  Laterality: Right;  CDE 9.16   CATARACT EXTRACTION W/PHACO Left 04/02/2021   Procedure: CATARACT EXTRACTION PHACO AND INTRAOCULAR LENS PLACEMENT LEFT EYE;  Surgeon: [name], MD;  Location: [facility];  Service: Ophthalmology;  Laterality: Left;  left CDE=6.75   ENDARTERECTOMY Left 03/27/2017   Procedure: ENDARTERECTOMY CAROTID LEFT;   Surgeon: [name], MD;  Location: [place];  Service: Vascular;  Laterality: Left;   INNER EAR SURGERY Right    puntured eardrum- repaired 2x's    KNEE ARTHROPLASTY     NASAL SINUS SURGERY     deviated septum   PATCH ANGIOPLASTY Left 03/27/2017   Procedure: PATCH ANGIOPLASTY LEFT CAROTID ARTERY USING Rueben Bash BIOLOGIC PATCH;  Surgeon: [name], MD;  Location: [place];  Service: Vascular;  Laterality: Left;   TONSILLECTOMY     TOTAL KNEE ARTHROPLASTY Right 10/23/2016   Procedure: RIGHT TOTAL KNEE ARTHROPLASTY;  Surgeon: [name], MD;  Location: [place];  Service: Orthopedics;  Laterality: Right;     Home Medications:  Prior to Admission medications   Medication Sig Start Date End Date Taking? Authorizing Provider  amiodarone (PACERONE) 200 MG tablet TAKE 1/2 TABLET EVERY OTHER DAY. 11/07/21  Yes Allred, Jeneen Rinks, MD  amLODipine (NORVASC) 5 MG tablet TAKE 1 TABLET AT 8AM AND 1 TABLET AT 8PM. 09/25/21  Yes Branch, Alphonse Guild, MD  apixaban (ELIQUIS) 5 MG TABS tablet TAKE (1) TABLET TWICE DAILY. 08/15/21  Yes Allred, Jeneen Rinks, MD  Calcium Carbonate-Vitamin D (CALCIUM 600+D PO) Take 1 tablet by mouth daily.   Yes [provider]  Camphor-Menthol-Methyl Sal (SALONPAS) 3.06-29-08 % PTCH Place 1 patch onto the skin at bedtime as needed (foot pain).   Yes [provider]  estradiol (ESTRACE) 0.5 MG tablet Take 0.5 mg by mouth daily.  04/18/16  Yes [provider]  fluticasone (FLONASE) 50 MCG/ACT nasal spray Place 2 sprays into both nostrils daily as needed for allergies.    Yes [provider]  furosemide (LASIX) 20 MG tablet TAKE 1 TABLET BY MOUTH DAILY AS NEEDED FOR EDEMA (LEG SWELLING/SHORTNESS OF BREATH). 05/16/21  Yes Allred, Jeneen Rinks, MD  Hypromellose (ARTIFICIAL TEARS OP) Place 2 drops into both eyes 2 (two) times daily as needed (for dry eyes).   Yes [provider]  losartan (COZAAR) 50 MG tablet TAKE 1 TABLET AT 10 AM AND 1 TABLET  AT 10 PM. 10/30/21  Yes Branch, Alphonse Guild, MD  magnesium hydroxide (MILK OF MAGNESIA) 400 MG/5ML suspension Take 30 mLs by mouth daily as needed for mild constipation.   Yes [provider]  metoprolol succinate (TOPROL-XL) 50 MG 24 hr tablet TAKE 1 TABLET AT LUNCH OR IMMEDIATELY FOLLOWING A MEAL. 11/07/21  Yes Allred, Jeneen Rinks, MD  pantoprazole (PROTONIX) 40 MG tablet TAKE (1) TABLET TWICE DAILY. 09/25/21  Yes Branch, Alphonse Guild, MD  potassium chloride SA (K-DUR,KLOR-CON) 20 MEQ tablet Take 1 tablet (20 mEq total) by mouth as needed (take on days you take your Furosemide). 07/29/18  Yes Herminio Commons, MD  vitamin B-12 (CYANOCOBALAMIN) 1000 MCG tablet Take 1,000 mcg by mouth daily.   Yes [provider]  predniSONE (DELTASONE) 10 MG tablet Take 1 tablet (10 mg total) by mouth daily with breakfast. Patient not taking: Reported on 12/25/2021 10/08/21   [name], MD    Inpatient Medications: Scheduled Meds:  apixaban  5 mg Oral BID   metoprolol succinate  50 mg Oral Daily   pantoprazole  40 mg Oral Daily   Continuous Infusions:  amiodarone     amiodarone     ceFEPime (MAXIPIME) IV 2 g (12/26/21 1142)   ceFEPime (MAXIPIME) IV     lactated ringers 100 mL/hr at 12/26/21 1054   [START ON 12/27/2021] vancomycin     vancomycin     PRN Meds: acetaminophen **OR** acetaminophen, acetaminophen, metoprolol tartrate, ondansetron **OR** ondansetron (ZOFRAN) IV  Allergies:    Allergies  Allergen Reactions   Rosuvastatin Other (See Comments)    MYALGIA, Muscle pain   Lisinopril Cough   Other Other (See Comments)    PAIN MEDICATIONS/ANESTHESIA  MADE BP DROP LOW    Singulair [Montelukast Sodium] Other (See Comments)    fatigue    Social History:   Social History   Socioeconomic History   Marital status: Single    Spouse name: Not on file   Number of children: Not on file   Years of education: Not on file   Highest education level: Not on file  Occupational History   Not  on file  Tobacco Use   Smoking status: Never   Smokeless tobacco: Never  Vaping Use   Vaping Use: Never used  Substance and Sexual Activity   Alcohol use: No   Drug use: No   Sexual activity: Not Currently    Birth control/protection: Surgical  Other Topics Concern   Not on file  Social History Narrative   Not on file   Social Determinants of Health   Financial Resource Strain: Not on file  Food Insecurity: Not on file  Transportation Needs: Not on file  Physical Activity: Not on file  Stress: Not on file  Social Connections: Not on file  Intimate Partner Violence: Not on file    Family History:    Family History  Problem Relation Age of Onset   Stroke Mother    Rheum arthritis Sister      ROS:  Please see the history of present illness.   All other ROS reviewed and negative.     Physical Exam/Data:   Vitals:   12/26/21 0142 12/26/21 0331 12/26/21 0359 12/26/21 1040  BP: 134/63 131/74 133/76 125/69  Pulse: (!) 112 (!) 115 (!) 104 (!) 120  Resp: 18 (!) '21 20 20  '$ Temp: 98.2 F (36.8 C) 98.1 F (36.7 C) 97.7 F (36.5 C) 98.1 F (36.7 C)  TempSrc: Oral Oral Oral Oral  SpO2: 96% 94% 94% 94%  Weight:      Height:        Intake/Output Summary (Last 24 hours) at 12/26/2021 1150 Last data filed at 12/26/2021 0900 Gross per 24 hour  Intake 1213.35 ml  Output 4 ml  Net 1209.35 ml      12/25/2021    5:28 AM 12/24/2021    9:22 PM 07/27/2021   10:17 AM  Last 3 Weights  Weight (lbs) 162 lb 7.7 oz 158 lb 153 lb 12.8 oz  Weight (kg) 73.7 kg 71.668 kg 69.763 kg     Body mass index is 25.45 kg/m.  General: Pleasant elderly female appearing in no acute distress HEENT: normal Neck: no JVD Vascular: No carotid bruits; Distal pulses 2+ bilaterally Cardiac:  normal S1, S2; Irregularly irregular.  Lungs:  clear to auscultation bilaterally, no wheezing, rhonchi or rales  Abd: soft, nontender, no hepatomegaly  Ext: no pitting edema Musculoskeletal:  No deformities, BUE  and BLE strength normal and equal Skin: Right forearm with  erythema and drainage.  Neuro:  CNs 2-12 intact, no focal abnormalities noted Psych:  Normal affect   EKG:  The EKG was personally reviewed and demonstrates: NSR, HR 84 with known LBBB.   Telemetry:  Telemetry was personally reviewed and demonstrates: Atrial fibrillation with RVR, HR in low-100's to 130's.   Relevant CV Studies:  Echocardiogram: 02/2021 IMPRESSIONS     1. Left ventricular ejection fraction, by estimation, is 65 to 70%. The  left ventricle has normal function. The left ventricle has no regional  wall motion abnormalities. Left ventricular diastolic parameters are  indeterminate.   2. Right ventricular systolic function is normal. The right ventricular  size is normal. There is mildly elevated pulmonary artery systolic  pressure.   3. Left atrial size was mildly dilated.   4. The mitral valve is normal in structure. Trivial mitral valve  regurgitation. No evidence of mitral stenosis.   5. The aortic valve is tricuspid. Aortic valve regurgitation is not  visualized. No aortic stenosis is present.   6. The inferior vena cava is normal in size with greater than 50%  respiratory variability, suggesting right atrial pressure of 3 mmHg.   Comparison(s): Previous Echo was technically challenging due to A-fib. LV  EF was '@50'$ %, with moderate MR and TR, and biatrial enlargement.   Carotid Dopplers: 05/2021 Summary:  Right Carotid: Velocities in the right ICA are consistent with a 1-39%  stenosis.   Left Carotid: Velocities in the left ICA are consistent with a 1-39%  stenosis.   Vertebrals:  Bilateral vertebral arteries demonstrate antegrade flow.  Subclavians: Normal flow hemodynamics were seen in bilateral subclavian               arteries.   Laboratory Data:  High Sensitivity Troponin:  No results for input(s): "TROPONINIHS" in the last 720 hours.   Chemistry Recent Labs  Lab 12/24/21 2158  12/25/21 0711 12/26/21 0452  NA 125* 130* 131*  K 4.1 3.9 3.7  CL 96* 102 102  CO2 18* 20* 22  GLUCOSE 143* 114* 115*  BUN 26* 21 14  CREATININE 1.68* 1.30* 0.99  CALCIUM 8.9 8.4* 8.1*  GFRNONAA 30* 41* 56*  ANIONGAP '11 8 7    '$ Recent Labs  Lab 12/24/21 2158 12/25/21 0711  PROT 6.6 5.7*  ALBUMIN 3.5 2.9*  AST 22 20  ALT 14 12  ALKPHOS 44 44  BILITOT 0.9 0.7   Lipids No results for input(s): "CHOL", "TRIG", "HDL", "LABVLDL", "LDLCALC", "CHOLHDL" in the last 168 hours.  Hematology Recent Labs  Lab 12/24/21 2158 12/25/21 0711 12/26/21 0452  WBC 25.7* 25.6* 18.8*  RBC 4.04 3.84* 3.46*  HGB 12.5 11.8* 10.5*  HCT 37.2 35.7* 32.3*  MCV 92.1 93.0 93.4  MCH 30.9 30.7 30.3  MCHC 33.6 33.1 32.5  RDW 13.9 14.2 14.1  PLT 280 252 220   Thyroid No results for input(s): "TSH", "FREET4" in the last 168 hours.  BNPNo results for input(s): "BNP", "PROBNP" in the last 168 hours.  DDimer No results for input(s): "DDIMER" in the last 168 hours.   Radiology/Studies:  DG Chest Port 1 View  Result Date: 12/24/2021 CLINICAL DATA:  Questionable sepsis with low blood pressure. EXAM: PORTABLE CHEST 1 VIEW COMPARISON:  December 23, 2017 FINDINGS: The heart size and mediastinal contours are within normal limits. There is mild calcification of the aortic arch. Low lung volumes are seen with diffuse, chronic appearing increased interstitial lung markings. Mild, stable linear scarring and/or atelectasis is seen within  the mid to lower left lung and bilateral lung bases. A chronic appearing deformity is seen along the lateral aspect of the sixth left rib. Multilevel degenerative changes seen throughout the thoracic spine. IMPRESSION: Mild, stable bilateral atelectatic changes. Electronically Signed   By: Virgina Norfolk M.D.   On: 12/24/2021 22:26     Assessment and Plan:   1. Atrial Fibrillation with RVR - She has a history of persistent atrial fibrillation and underwent prior DCCV's in 2019 and  this was ultimately successful after being started on Amiodarone. By review of notes, she has not had recurrence since. Suspect her recurrent arrhythmia is triggered by her acute illness at this time. - Reviewed with Dr. Johnsie Cancel and will plan to transfer to the ICU for initiation of IV Amiodarone (on '100mg'$  every other day prior to admission). LFT's WNL this admission and will check TSH. Hopefully she will convert back to normal sinus rhythm with this and with further management of her Urosepsis. She may ultimately require repeat DCCV in several weeks if she does not convert back to normal sinus rhythm. If required this admission, she would require a TEE initially given possible missed doses of Eliquis prior to admission. - Remains on Toprol-XL '50mg'$  daily and Eliquis '5mg'$  BID for anticoagulation.   2. Carotid Artery Stenosis - She is s/p L CEA and is followed by Vascular Surgery as an outpatient. Recent dopplers in 05/2021 showed 1-39% stenosis bilaterally.   3. Mitral Regurgitation - This had improved was only trivial by recent echocardiogram in 02/2021.  4. HTN - BP has been variable over the past 24 hours, at 102/50 - 125/69. Agree with holding Amlodipine to allow for further titration of AV nodal blocking agents.  Continue Toprol-XL.  5. Urosepsis/AKI/Hyponatremia - AMS appears to be improving on examination. WBC was elevated to 25.7 on admission, improved to 18.8 today. Creatinine has also normalized and Na+ has improved from 125 to 131. Remains on antibiotic therapy. Management per the admitting team.   6. Right Forearm Cellulitis - Reports an [employer] doll fell on her arm and she has since experienced worsening swelling, erythema and drainage to the area. [place] ordered by the admitting team and cultures pending. Now receiving Cefepime and Vancomycin.    Risk Assessment/Risk Scores:    CHA2DS2-VASc Score = 4   This indicates a 4.8% annual risk of stroke. The patient's score is based  upon: CHF History: 0 HTN History: 0 Diabetes History: 0 Stroke History: 0 Vascular Disease History: 1 Age Score: 2 Gender Score: 1    For questions or updates, please contact [place] Please consult [email or web address] for contact info under    Signed, Erma Heritage, PA-C  12/26/2021 11:50 AM  Patient examined chart reviewed discussed care with patient and daughter Exam with elderly female eating lunch MS good Echymosis/cellulitis LUE SEM lungs clear abdomen benign EC with afib nonspecific ST changes PAF on eliquis in setting of infection.  IV amiodarone 48 hours then convert to 200 mg bid unless converts. Continue Toprol for rate control and eliquis Can be d/c in afib with dose 200 mg oral BID  Has appointment with Dr Harl Bowie on 01/26/22 already  Jenkins Rouge MD [facility]

## 2021-12-27 ENCOUNTER — Inpatient Hospital Stay (HOSPITAL_COMMUNITY): Payer: Medicare Other

## 2021-12-27 DIAGNOSIS — A419 Sepsis, unspecified organism: Secondary | ICD-10-CM | POA: Diagnosis not present

## 2021-12-27 DIAGNOSIS — I6522 Occlusion and stenosis of left carotid artery: Secondary | ICD-10-CM | POA: Diagnosis not present

## 2021-12-27 DIAGNOSIS — I10 Essential (primary) hypertension: Secondary | ICD-10-CM | POA: Diagnosis not present

## 2021-12-27 DIAGNOSIS — G9341 Metabolic encephalopathy: Secondary | ICD-10-CM | POA: Diagnosis not present

## 2021-12-27 DIAGNOSIS — N179 Acute kidney failure, unspecified: Secondary | ICD-10-CM | POA: Diagnosis not present

## 2021-12-27 DIAGNOSIS — I482 Chronic atrial fibrillation, unspecified: Secondary | ICD-10-CM | POA: Diagnosis not present

## 2021-12-27 LAB — BLOOD GAS, VENOUS
Acid-Base Excess: 1.5 mmol/L (ref 0.0–2.0)
Bicarbonate: 24.4 mmol/L (ref 20.0–28.0)
Drawn by: 6643
FIO2: 44 %
O2 Saturation: 78.2 %
Patient temperature: 37.1
pCO2, Ven: 32 mmHg — ABNORMAL LOW (ref 44–60)
pH, Ven: 7.49 — ABNORMAL HIGH (ref 7.25–7.43)
pO2, Ven: 43 mmHg (ref 32–45)

## 2021-12-27 LAB — BASIC METABOLIC PANEL
Anion gap: 6 (ref 5–15)
BUN: 10 mg/dL (ref 8–23)
CO2: 19 mmol/L — ABNORMAL LOW (ref 22–32)
Calcium: 7.9 mg/dL — ABNORMAL LOW (ref 8.9–10.3)
Chloride: 104 mmol/L (ref 98–111)
Creatinine, Ser: 0.89 mg/dL (ref 0.44–1.00)
GFR, Estimated: >60 mL/min (ref >60)
Glucose, Bld: 130 mg/dL — ABNORMAL HIGH (ref 70–99)
Potassium: 3.6 mmol/L (ref 3.5–5.1)
Sodium: 129 mmol/L — ABNORMAL LOW (ref 135–145)

## 2021-12-27 LAB — CBC
HCT: 34.5 % — ABNORMAL LOW (ref 36.0–46.0)
Hemoglobin: 11.3 g/dL — ABNORMAL LOW (ref 12.0–15.0)
MCH: 30.3 pg (ref 26.0–34.0)
MCHC: 32.8 g/dL (ref 30.0–36.0)
MCV: 92.5 fL (ref 80.0–100.0)
Platelets: 264 10*3/uL (ref 150–400)
RBC: 3.73 MIL/uL — ABNORMAL LOW (ref 3.87–5.11)
RDW: 14 % (ref 11.5–15.5)
WBC: 15.9 10*3/uL — ABNORMAL HIGH (ref 4.0–10.5)
nRBC: 0 % (ref 0.0–0.2)

## 2021-12-27 LAB — COMPREHENSIVE METABOLIC PANEL
ALT: 13 U/L (ref 0–44)
AST: 17 U/L (ref 15–41)
Albumin: 2.5 g/dL — ABNORMAL LOW (ref 3.5–5.0)
Alkaline Phosphatase: 53 U/L (ref 38–126)
Anion gap: 6 (ref 5–15)
BUN: 11 mg/dL (ref 8–23)
CO2: 21 mmol/L — ABNORMAL LOW (ref 22–32)
Calcium: 7.9 mg/dL — ABNORMAL LOW (ref 8.9–10.3)
Chloride: 105 mmol/L (ref 98–111)
Creatinine, Ser: 0.97 mg/dL (ref 0.44–1.00)
GFR, Estimated: 58 mL/min — ABNORMAL LOW (ref >60)
Glucose, Bld: 126 mg/dL — ABNORMAL HIGH (ref 70–99)
Potassium: 3.6 mmol/L (ref 3.5–5.1)
Sodium: 132 mmol/L — ABNORMAL LOW (ref 135–145)
Total Bilirubin: 0.3 mg/dL (ref 0.3–1.2)
Total Protein: 5.6 g/dL — ABNORMAL LOW (ref 6.5–8.1)

## 2021-12-27 LAB — TSH: TSH: 3.79 u[IU]/mL (ref 0.350–4.500)

## 2021-12-27 LAB — MAGNESIUM: Magnesium: 1.8 mg/dL (ref 1.7–2.4)

## 2021-12-27 LAB — MRSA NEXT GEN BY PCR, NASAL: MRSA by PCR Next Gen: NOT DETECTED

## 2021-12-27 LAB — BRAIN NATRIURETIC PEPTIDE: B Natriuretic Peptide: 1074 pg/mL — ABNORMAL HIGH (ref 0.0–100.0)

## 2021-12-27 LAB — TROPONIN I (HIGH SENSITIVITY): Troponin I (High Sensitivity): 12 ng/L (ref <18)

## 2021-12-27 MED ORDER — METOPROLOL SUCCINATE ER 50 MG PO TB24
75.0000 mg | ORAL_TABLET | Freq: Every day | ORAL | Status: DC
  Filled 2021-12-27: qty 1

## 2021-12-27 MED ORDER — METOPROLOL TARTRATE 5 MG/5ML IV SOLN
5.0000 mg | Freq: Once | INTRAVENOUS | Status: AC
  Administered 2021-12-27: 5 mg via INTRAVENOUS
  Filled 2021-12-27: qty 5

## 2021-12-27 MED ORDER — ALBUMIN HUMAN 25 % IV SOLN
25.0000 g | Freq: Once | INTRAVENOUS | Status: AC
  Administered 2021-12-27: 25 g via INTRAVENOUS
  Filled 2021-12-27: qty 100

## 2021-12-27 MED ORDER — FUROSEMIDE 10 MG/ML IJ SOLN
40.0000 mg | Freq: Once | INTRAMUSCULAR | Status: AC
  Administered 2021-12-27: 40 mg via INTRAVENOUS
  Filled 2021-12-27: qty 4

## 2021-12-27 MED ORDER — LEVALBUTEROL HCL 0.63 MG/3ML IN NEBU
0.6300 mg | INHALATION_SOLUTION | Freq: Once | RESPIRATORY_TRACT | Status: AC
  Administered 2021-12-27: 0.63 mg via RESPIRATORY_TRACT
  Filled 2021-12-27: qty 3

## 2021-12-27 MED ORDER — EZETIMIBE 10 MG PO TABS
10.0000 mg | ORAL_TABLET | Freq: Every day | ORAL | Status: DC
Start: 2021-12-27 — End: 2022-01-02
  Administered 2021-12-27 – 2022-01-02 (×7): 10 mg via ORAL
  Filled 2021-12-27 (×7): qty 1

## 2021-12-27 MED ORDER — METOPROLOL SUCCINATE ER 25 MG PO TB24
25.0000 mg | ORAL_TABLET | Freq: Once | ORAL | Status: AC
  Administered 2021-12-27: 25 mg via ORAL
  Filled 2021-12-27: qty 1

## 2021-12-27 MED ORDER — LEVALBUTEROL HCL 0.63 MG/3ML IN NEBU: 0.6300 mg | INHALATION_SOLUTION | Freq: Four times a day (QID) | RESPIRATORY_TRACT | Status: DC | PRN

## 2021-12-27 NOTE — Progress Notes (Signed)
PROGRESS NOTE        PATIENT DETAILS Name: Lydia Graham Age: 86 y.o. Sex: female Date of Birth: [birth year] Admit Date: 12/24/2021 Admitting Physician Bernadette Hoit, DO HFW:YOVZ, Costella Hatcher, MD  Brief Summary: Patient is a 86 y.o.  female with history of persistent atrial fibrillation, GERD who was recently diagnosed with UTI-and started on Cipro (took for 1 day)-she was brought to the hospital for fever, altered mental status-and subsequently admitted to the hospitalist service.  She was started on IV antibiotics and IV fluids. She developed rapid atrial fibrillation. Cardiology consulted.    Significant events: 7/3>> diagnosed with UTI as outpatient-started on Cipro-but developed confusion-admit to [facility] 7/5>>transfer to SDU for rapid A fib. Cardiology consulted  Significant studies: 7/3>> no obvious PNA  Significant microbiology data: 7/3>> blood culture: No growth 7/4>> urine culture: No growth 7/5>>wound culture  Procedures: None  Consults: cardiology  Subjective: Pain in right forearm is better today. She has had increasing oxygen requirements overnight and is now on 7L. Remains on amiodarone infusion  Objective: Vitals: Blood pressure (!) 141/67, pulse (!) 113, temperature 97.9 F (36.6 C), temperature source Oral, resp. rate (!) 25, height '5\' 7"'$  (1.702 m), weight 77.4 kg, SpO2 92 %.   Exam: Gen Exam:Alert awake-not in any distress HEENT:atraumatic, normocephalic Chest: coarse breath sounds at bases CVS:S1S2 irregular Abdomen:soft non tender, non distended.  Mild bilateral CVA tenderness. Extremities:right forearm is erythematous, swollen with wound draining clear fluid, she does have some lower extremity edema Neurology: Non focal Skin: no rash  Pertinent Labs/Radiology:    Latest Ref Rng & Units 12/27/2021    9:39 AM 12/26/2021    4:52 AM 12/25/2021    7:11 AM  CBC  WBC 4.0 - 10.5 K/uL 15.9  18.8  25.6   Hemoglobin 12.0 - 15.0  g/dL 11.3  10.5  11.8   Hematocrit 36.0 - 46.0 % 34.5  32.3  35.7   Platelets 150 - 400 K/uL 264  220  252     Lab Results  Component Value Date   NA 129 (L) 12/27/2021   K 3.6 12/27/2021   CL 104 12/27/2021   CO2 19 (L) 12/27/2021      Assessment/Plan: Sepsis due to cellulitis of right forearm:  -Sepsis physiology improving -continue IV antibiotics -urine/blood cultures without growth -follow wound culture of right forearm  Acute metabolic encephalopathy: Due to sepsis/pyelonephritis-resolved-completely awake and alert.  AKI: Hemodynamically mediated-in the setting of sepsis-improved with IV fluids.  Acute respiratory failure with hypoxia -possibly related to CHF -oxygen requirements currently at 7L [place] -BNP elevated -she has been getting aggressive hydration due to sepsis -hold further IV fluids -will give one dose of lasix -try and wean off oxygen as tolerated  Hyponatremia: Due to dehydration-improving with IVF.  Persistent A-fib with RVR:  -likely precipitated by sepsis -cardiology following -currently on toprol (dose being adjusted) -on amiodarone infusion -HR currently uncontrolled -anticoagulated with eliquis  HTN: currently on Toprol-XL -continue to hold amlodipine and losartan to allow for further rate control.    GERD: Continue PPI  Right forearm laceration/cellulitis: Following accidental drop of a American doll on her arm (several days PTA) -woc for dressings -ultrasound of right forearm does not show any clear abscess -continue on vancomycin and cefepime -keep elevated -wound culture in process  BMI: Estimated body mass index is 26.73 kg/m  as calculated from the following:   Height as of this encounter: '5\' 7"'$  (1.702 m).   Weight as of this encounter: 77.4 kg.   Code status:   Code Status: Full Code   DVT Prophylaxis: SCDs Start: 12/25/21 0759 apixaban (ELIQUIS) tablet 5 mg    Family Communication: sister updated at the  bedside   Disposition Plan: Status is: Inpatient Remains inpatient appropriate because: Resolving sepsis physiology-A-fib RVR-noted    Planned Discharge Destination:Home   Diet: Diet Order             Diet heart healthy/carb modified Room service appropriate? Yes; Fluid consistency: Thin  Diet effective now                     Antimicrobial agents: Anti-infectives (From admission, onward)    Start     Dose/Rate Route Frequency Ordered Stop   12/27/21 1200  vancomycin (VANCOREADY) IVPB 1250 mg/250 mL        1,250 mg 166.7 mL/hr over 90 Minutes Intravenous Every 24 hours 12/26/21 1052     12/26/21 2300  ceFEPIme (MAXIPIME) 2 g in sodium chloride 0.9 % 100 mL IVPB        2 g 200 mL/hr over 30 Minutes Intravenous Every 12 hours 12/26/21 1052     12/26/21 1145  vancomycin (VANCOCIN) IVPB 1000 mg/200 mL premix  Status:  Discontinued        1,000 mg 200 mL/hr over 60 Minutes Intravenous  Once 12/26/21 1046 12/26/21 1050   12/26/21 1145  vancomycin (VANCOREADY) IVPB 1500 mg/300 mL        1,500 mg 150 mL/hr over 120 Minutes Intravenous  Once 12/26/21 1050 12/26/21 1441   12/26/21 1130  ceFEPIme (MAXIPIME) 2 g in sodium chloride 0.9 % 100 mL IVPB        2 g 200 mL/hr over 30 Minutes Intravenous  Once 12/26/21 1046 12/26/21 1312   12/25/21 2200  cefTRIAXone (ROCEPHIN) 1 g in sodium chloride 0.9 % 100 mL IVPB  Status:  Discontinued        1 g 200 mL/hr over 30 Minutes Intravenous Every 24 hours 12/25/21 0749 12/26/21 1046   12/24/21 2215  cefTRIAXone (ROCEPHIN) 2 g in sodium chloride 0.9 % 100 mL IVPB        2 g 200 mL/hr over 30 Minutes Intravenous  Once 12/24/21 2207 12/24/21 2316        MEDICATIONS: Scheduled Meds:  apixaban  5 mg Oral BID   Chlorhexidine Gluconate Cloth  6 each Topical Daily   estradiol  0.5 mg Oral Daily   ezetimibe  10 mg Oral Daily   furosemide  40 mg Intravenous Once   [START ON 12/28/2021] metoprolol succinate  75 mg Oral Daily   pantoprazole   40 mg Oral Daily   Continuous Infusions:  albumin human     amiodarone 30 mg/hr (12/26/21 1900)   ceFEPime (MAXIPIME) IV 2 g (12/27/21 0924)   vancomycin     PRN Meds:.acetaminophen **OR** acetaminophen, acetaminophen, levalbuterol, metoprolol tartrate, ondansetron **OR** ondansetron (ZOFRAN) IV   I have personally reviewed following labs and imaging studies  LABORATORY DATA: CBC: Recent Labs  Lab 12/24/21 2158 12/25/21 0711 12/26/21 0452 12/27/21 0939  WBC 25.7* 25.6* 18.8* 15.9*  NEUTROABS 21.2* 21.0*  --   --   HGB 12.5 11.8* 10.5* 11.3*  HCT 37.2 35.7* 32.3* 34.5*  MCV 92.1 93.0 93.4 92.5  PLT 280 252 220 811    Basic Metabolic  Panel: Recent Labs  Lab 12/24/21 2158 12/25/21 0711 12/26/21 0452 12/27/21 0058 12/27/21 0939  NA 125* 130* 131* 132* 129*  K 4.1 3.9 3.7 3.6 3.6  CL 96* 102 102 105 104  CO2 18* 20* 22 21* 19*  GLUCOSE 143* 114* 115* 126* 130*  BUN 26* '21 14 11 10  '$ CREATININE 1.68* 1.30* 0.99 0.97 0.89  CALCIUM 8.9 8.4* 8.1* 7.9* 7.9*  MG  --   --   --  1.8  --     GFR: Estimated Creatinine Clearance: 50.4 mL/min (by C-G formula based on SCr of 0.89 mg/dL).  Liver Function Tests: Recent Labs  Lab 12/24/21 2158 12/25/21 0711 12/27/21 0058  AST '22 20 17  '$ ALT '14 12 13  '$ ALKPHOS 44 44 53  BILITOT 0.9 0.7 0.3  PROT 6.6 5.7* 5.6*  ALBUMIN 3.5 2.9* 2.5*   No results for input(s): "LIPASE", "AMYLASE" in the last 168 hours. No results for input(s): "AMMONIA" in the last 168 hours.  Coagulation Profile: Recent Labs  Lab 12/24/21 2158  INR 1.4*    Cardiac Enzymes: No results for input(s): "CKTOTAL", "CKMB", "CKMBINDEX", "TROPONINI" in the last 168 hours.  BNP (last 3 results) No results for input(s): "PROBNP" in the last 8760 hours.  Lipid Profile: No results for input(s): "CHOL", "HDL", "LDLCALC", "TRIG", "CHOLHDL", "LDLDIRECT" in the last 72 hours.  Thyroid Function Tests: Recent Labs    12/27/21 0059  TSH 3.790    Anemia  Panel: No results for input(s): "VITAMINB12", "FOLATE", "FERRITIN", "TIBC", "IRON", "RETICCTPCT" in the last 72 hours.  Urine analysis:    Component Value Date/Time   COLORURINE YELLOW 12/25/2021 0115   APPEARANCEUR CLEAR 12/25/2021 0115   LABSPEC 1.013 12/25/2021 0115   PHURINE 6.0 12/25/2021 0115   GLUCOSEU NEGATIVE 12/25/2021 0115   HGBUR SMALL (A) 12/25/2021 0115   BILIRUBINUR NEGATIVE 12/25/2021 0115   KETONESUR NEGATIVE 12/25/2021 0115   PROTEINUR NEGATIVE 12/25/2021 0115   NITRITE NEGATIVE 12/25/2021 0115   LEUKOCYTESUR NEGATIVE 12/25/2021 0115    Sepsis Labs: Lactic Acid, Venous    Component Value Date/Time   LATICACIDVEN 1.8 12/25/2021 0016    MICROBIOLOGY: Recent Results (from the past 240 hour(s))  Blood Culture (routine x 2)     Status: None (Preliminary result)   Collection Time: 12/24/21  9:58 PM   Specimen: BLOOD LEFT WRIST  Result Value Ref Range Status   Specimen Description BLOOD LEFT WRIST  Final   Special Requests   Final    BOTTLES DRAWN AEROBIC AND ANAEROBIC Blood Culture adequate volume   Culture   Final    NO GROWTH 3 DAYS Performed at [facility], [address], [place], [place] [address]    Report Status PENDING  Incomplete  Blood Culture (routine x 2)     Status: None (Preliminary result)   Collection Time: 12/24/21  9:58 PM   Specimen: BLOOD RIGHT FOREARM  Result Value Ref Range Status   Specimen Description BLOOD RIGHT FOREARM  Final   Special Requests   Final    BOTTLES DRAWN AEROBIC AND ANAEROBIC Blood Culture adequate volume   Culture   Final    NO GROWTH 3 DAYS Performed at [facility], [address], [place], [place] [address]    Report Status PENDING  Incomplete  Urine Culture     Status: None   Collection Time: 12/25/21  1:15 AM   Specimen: Urine, Clean Catch  Result Value Ref Range Status   Specimen Description   Final  URINE, CLEAN CATCH Performed at [facility], [address], [place], [place] [address]    Special  Requests   Final    NONE Performed at [facility], [address], [place], [place] [address]    Culture   Final    NO GROWTH Performed at [facility], [place] [address], [place], [place] [address]    Report Status 12/26/2021 FINAL  Final  Aerobic Culture w Gram Stain (superficial specimen)     Status: None (Preliminary result)   Collection Time: 12/26/21 11:03 AM   Specimen: Wound  Result Value Ref Range Status   Specimen Description   Final    WOUND Performed at [facility], [address], [place], [place] [address]    Special Requests   Final    NONE Performed at [facility], [address] [place], [place] [address]    Gram Stain   Final    NO WBC SEEN RARE GRAM POSITIVE COCCI IN PAIRS Performed at [facility], [place] [address] [place], [place] [address]    Culture PENDING  Incomplete   Report Status PENDING  Incomplete  MRSA Next Gen by PCR, Nasal     Status: None   Collection Time: 12/26/21 12:22 PM   Specimen: Nasal Mucosa; Nasal Swab  Result Value Ref Range Status   MRSA by PCR Next Gen NOT DETECTED NOT DETECTED Final    Comment: (NOTE) The GeneXpert MRSA Assay (FDA approved for NASAL specimens only), is one component of a comprehensive MRSA colonization surveillance program. It is not intended to diagnose MRSA infection nor to guide or monitor treatment for MRSA infections. Test performance is not FDA approved in patients less than 2 years old. Performed at [facility], [address], [place], [place] [address]     RADIOLOGY STUDIES/RESULTS: DG CHEST PORT 1 VIEW  Result Date: 12/27/2021 CLINICAL DATA:  Hypoxia. EXAM: PORTABLE CHEST 1 VIEW COMPARISON:  12/24/2021. FINDINGS: The heart is enlarged the mediastinal contour is within normal limits. Atherosclerotic calcification of the aorta is noted. Interstitial prominence is noted bilaterally. There is a small left pleural effusion with atelectasis or infiltrate. No pneumothorax. Degenerative changes  are present in the thoracic spine. IMPRESSION: 1. Cardiomegaly. 2. Small left pleural effusion with atelectasis or infiltrate. Electronically Signed   By: Brett Fairy M.D.   On: 12/27/2021 01:15   [place] RT UPPER EXTREM LTD SOFT TISSUE NON VASCULAR  Result Date: 12/26/2021 CLINICAL DATA:  Right upper extremity edema. Fever and altered mental status. Urinary tract infection. EXAM: ULTRASOUND RIGHT UPPER EXTREMITY LIMITED TECHNIQUE: Ultrasound examination of the upper extremity soft tissues was performed in the area of clinical concern. COMPARISON:  None Available. FINDINGS: Infiltrative subcutaneous edema noted along the antecubital fossa and anterior forearm. No drainable fluid collection or visible solid mass. No obvious occlusion of adjacent superficial vessels. IMPRESSION: 1. Infiltrative subcutaneous edema along the anterior forearm and antecubital region. Cellulitis is not excluded. No drainable abscess is currently observed. Electronically Signed   By: Van Clines M.D.   On: 12/26/2021 12:05     LOS: 2 days   Kathie Dike, MD  [facility]    To contact the attending provider between 7A-7P or the covering provider during after hours 7P-7A, please log into the web site [email or web address] and access using universal [place] password for that web site. If you do not have the password, please call the hospital operator.  12/27/2021, 10:46 AM

## 2021-12-27 NOTE — Progress Notes (Signed)
Progress Note  Patient Name: Lydia Graham Date of Encounter: 12/27/2021  [facility] Cardiologist: [name], MD   Subjective   Feeling much better today. Mental status significantly improved. HR remain 100-120s.  Inpatient Medications    Scheduled Meds:  apixaban  5 mg Oral BID   Chlorhexidine Gluconate Cloth  6 each Topical Daily   estradiol  0.5 mg Oral Daily   metoprolol succinate  50 mg Oral Daily   pantoprazole  40 mg Oral Daily   Continuous Infusions:  amiodarone 30 mg/hr (12/26/21 1900)   ceFEPime (MAXIPIME) IV Stopped (12/26/21 2240)   lactated ringers 100 mL/hr at 12/26/21 1900   vancomycin     PRN Meds: acetaminophen **OR** acetaminophen, acetaminophen, levalbuterol, metoprolol tartrate, ondansetron **OR** ondansetron (ZOFRAN) IV   Vital Signs    Vitals:   12/27/21 0731 12/27/21 0740 12/27/21 0800 12/27/21 0825  BP:  (!) 174/89 (!) 157/84   Pulse:  (!) 125 (!) 140 (!) 113  Resp:  (!) 24 (!) 28 (!) 27  Temp: 97.9 F (36.6 C)     TempSrc: Oral     SpO2:  91% 91% 92%  Weight:      Height:        Intake/Output Summary (Last 24 hours) at 12/27/2021 0829 Last data filed at 12/27/2021 0753 Gross per 24 hour  Intake 3535.91 ml  Output 1150 ml  Net 2385.91 ml      12/27/2021    5:00 AM 12/26/2021   12:21 PM 12/25/2021    5:28 AM  Last 3 Weights  Weight (lbs) 170 lb 10.2 oz 166 lb 3.6 oz 162 lb 7.7 oz  Weight (kg) 77.4 kg 75.4 kg 73.7 kg      Telemetry    Afib with RVR with HR 100-120s - Personally Reviewed  ECG    Afib with RVR, IVCD - Personally Reviewed  Physical Exam   GEN: No acute distress.   Neck: No JVD Cardiac: Tachycardic, irregular, no murmurs Respiratory: Bibasilar crackles GI: Soft, nontender, non-distended  MS: RUE wrapped, trace LE edema Neuro:  Nonfocal  Psych: Normal affect   Labs    High Sensitivity Troponin:   Recent Labs  Lab 12/27/21 0058  TROPONINIHS 12     Chemistry Recent Labs  Lab  12/24/21 2158 12/25/21 0711 12/26/21 0452 12/27/21 0058  NA 125* 130* 131* 132*  K 4.1 3.9 3.7 3.6  CL 96* 102 102 105  CO2 18* 20* 22 21*  GLUCOSE 143* 114* 115* 126*  BUN 26* '21 14 11  '$ CREATININE 1.68* 1.30* 0.99 0.97  CALCIUM 8.9 8.4* 8.1* 7.9*  MG  --   --   --  1.8  PROT 6.6 5.7*  --  5.6*  ALBUMIN 3.5 2.9*  --  2.5*  AST 22 20  --  17  ALT 14 12  --  13  ALKPHOS 44 44  --  53  BILITOT 0.9 0.7  --  0.3  GFRNONAA 30* 41* 56* 58*  ANIONGAP '11 8 7 6    '$ Lipids No results for input(s): "CHOL", "TRIG", "HDL", "LABVLDL", "LDLCALC", "CHOLHDL" in the last 168 hours.  Hematology Recent Labs  Lab 12/24/21 2158 12/25/21 0711 12/26/21 0452  WBC 25.7* 25.6* 18.8*  RBC 4.04 3.84* 3.46*  HGB 12.5 11.8* 10.5*  HCT 37.2 35.7* 32.3*  MCV 92.1 93.0 93.4  MCH 30.9 30.7 30.3  MCHC 33.6 33.1 32.5  RDW 13.9 14.2 14.1  PLT 280 252 220  Thyroid  Recent Labs  Lab 12/27/21 0059  TSH 3.790    BNPNo results for input(s): "BNP", "PROBNP" in the last 168 hours.  DDimer No results for input(s): "DDIMER" in the last 168 hours.   Radiology    DG CHEST PORT 1 VIEW  Result Date: 12/27/2021 CLINICAL DATA:  Hypoxia. EXAM: PORTABLE CHEST 1 VIEW COMPARISON:  12/24/2021. FINDINGS: The heart is enlarged the mediastinal contour is within normal limits. Atherosclerotic calcification of the aorta is noted. Interstitial prominence is noted bilaterally. There is a small left pleural effusion with atelectasis or infiltrate. No pneumothorax. Degenerative changes are present in the thoracic spine. IMPRESSION: 1. Cardiomegaly. 2. Small left pleural effusion with atelectasis or infiltrate. Electronically Signed   By: Brett Fairy M.D.   On: 12/27/2021 01:15   [place] RT UPPER EXTREM LTD SOFT TISSUE NON VASCULAR  Result Date: 12/26/2021 CLINICAL DATA:  Right upper extremity edema. Fever and altered mental status. Urinary tract infection. EXAM: ULTRASOUND RIGHT UPPER EXTREMITY LIMITED TECHNIQUE: Ultrasound  examination of the upper extremity soft tissues was performed in the area of clinical concern. COMPARISON:  None Available. FINDINGS: Infiltrative subcutaneous edema noted along the antecubital fossa and anterior forearm. No drainable fluid collection or visible solid mass. No obvious occlusion of adjacent superficial vessels. IMPRESSION: 1. Infiltrative subcutaneous edema along the anterior forearm and antecubital region. Cellulitis is not excluded. No drainable abscess is currently observed. Electronically Signed   By: Van Clines M.D.   On: 12/26/2021 12:05    Cardiac Studies   Echocardiogram: 02/2021 IMPRESSIONS     1. Left ventricular ejection fraction, by estimation, is 65 to 70%. The  left ventricle has normal function. The left ventricle has no regional  wall motion abnormalities. Left ventricular diastolic parameters are  indeterminate.   2. Right ventricular systolic function is normal. The right ventricular  size is normal. There is mildly elevated pulmonary artery systolic  pressure.   3. Left atrial size was mildly dilated.   4. The mitral valve is normal in structure. Trivial mitral valve  regurgitation. No evidence of mitral stenosis.   5. The aortic valve is tricuspid. Aortic valve regurgitation is not  visualized. No aortic stenosis is present.   6. The inferior vena cava is normal in size with greater than 50%  respiratory variability, suggesting right atrial pressure of 3 mmHg.   Comparison(s): Previous Echo was technically challenging due to A-fib. LV  EF was '@50'$ %, with moderate MR and TR, and biatrial enlargement.    Carotid Dopplers: 05/2021 Summary:  Right Carotid: Velocities in the right ICA are consistent with a 1-39%  stenosis.   Left Carotid: Velocities in the left ICA are consistent with a 1-39%  stenosis.   Vertebrals:  Bilateral vertebral arteries demonstrate antegrade flow.  Subclavians: Normal flow hemodynamics were seen in bilateral  subclavian               arteries.   Patient Profile     86 y.o. female with hx of persistent atrial fibrillation on amiodarone and apixaban, carotid artery stenosis (s/p L CEA),  HTN and HLD who presented with fever and AMS found to be septic from UTI. Course complicated by Afib with RVR for which Cardiology was consulted.   Assessment & Plan    #Atrial Fibrillation with RVR: - Patient with history of persistent atrial fibrillation and underwent prior DCCV's in 2019 which were ultimately successful after being started on Amiodarone with no known recurrence until this admission - Suspect  current Afib episode triggered by acute illness - Continue IV amiodarone today; can re-bolus '150mg'$  if HR persistently >120 - Increase Metoprolol to '75mg'$  XL daily given improvement in BP - Continue Eliquis '5mg'$  BID for anticoagulation - If remains in Afib with difficult to control rates despite improvement from a sepsis standpoint, can plan for DCCV   #Carotid Artery Stenosis - S/p L CEA and is followed by Vascular Surgery as an outpatient - Recent dopplers in 05/2021 showed 1-39% stenosis bilaterally - Not on ASA due to need for [facility] - Not on statins due to intolerance; start zetia '10mg'$  daily   #HTN - BP elevated today - Increase metop to '75mg'$  XL daily - Can likely add back home amlodipine tomorrow pending blood pressures   #Urosepsis/AKI/Hyponatremia - Management per primary team - Renal function improving - On vanc/cefepime   #Right Forearm Cellulitis - Continue vanc/cefepime per primary      For questions or updates, please contact [facility] Please consult [email or web address] for contact info under        Signed, Freada Bergeron, MD  12/27/2021, 8:29 AM

## 2021-12-28 ENCOUNTER — Inpatient Hospital Stay (HOSPITAL_COMMUNITY): Payer: Medicare Other

## 2021-12-28 DIAGNOSIS — I482 Chronic atrial fibrillation, unspecified: Secondary | ICD-10-CM | POA: Diagnosis not present

## 2021-12-28 DIAGNOSIS — N390 Urinary tract infection, site not specified: Secondary | ICD-10-CM | POA: Diagnosis not present

## 2021-12-28 DIAGNOSIS — N179 Acute kidney failure, unspecified: Secondary | ICD-10-CM | POA: Diagnosis not present

## 2021-12-28 DIAGNOSIS — G9341 Metabolic encephalopathy: Secondary | ICD-10-CM | POA: Diagnosis not present

## 2021-12-28 DIAGNOSIS — A419 Sepsis, unspecified organism: Secondary | ICD-10-CM

## 2021-12-28 LAB — BASIC METABOLIC PANEL
Anion gap: 7 (ref 5–15)
BUN: 11 mg/dL (ref 8–23)
CO2: 23 mmol/L (ref 22–32)
Calcium: 8 mg/dL — ABNORMAL LOW (ref 8.9–10.3)
Chloride: 103 mmol/L (ref 98–111)
Creatinine, Ser: 0.97 mg/dL (ref 0.44–1.00)
GFR, Estimated: 58 mL/min — ABNORMAL LOW (ref >60)
Glucose, Bld: 107 mg/dL — ABNORMAL HIGH (ref 70–99)
Potassium: 3.3 mmol/L — ABNORMAL LOW (ref 3.5–5.1)
Sodium: 133 mmol/L — ABNORMAL LOW (ref 135–145)

## 2021-12-28 LAB — CBC
HCT: 31.7 % — ABNORMAL LOW (ref 36.0–46.0)
Hemoglobin: 10.7 g/dL — ABNORMAL LOW (ref 12.0–15.0)
MCH: 30.7 pg (ref 26.0–34.0)
MCHC: 33.8 g/dL (ref 30.0–36.0)
MCV: 90.8 fL (ref 80.0–100.0)
Platelets: 271 10*3/uL (ref 150–400)
RBC: 3.49 MIL/uL — ABNORMAL LOW (ref 3.87–5.11)
RDW: 14.2 % (ref 11.5–15.5)
WBC: 10.5 10*3/uL (ref 4.0–10.5)
nRBC: 0 % (ref 0.0–0.2)

## 2021-12-28 LAB — AEROBIC CULTURE W GRAM STAIN (SUPERFICIAL SPECIMEN): Gram Stain: NONE SEEN

## 2021-12-28 LAB — BRAIN NATRIURETIC PEPTIDE: B Natriuretic Peptide: 808 pg/mL — ABNORMAL HIGH (ref 0.0–100.0)

## 2021-12-28 MED ORDER — FUROSEMIDE 10 MG/ML IJ SOLN
20.0000 mg | Freq: Once | INTRAMUSCULAR | Status: AC
  Administered 2021-12-28: 20 mg via INTRAVENOUS
  Filled 2021-12-28: qty 2

## 2021-12-28 MED ORDER — POTASSIUM CHLORIDE CRYS ER 20 MEQ PO TBCR
40.0000 meq | EXTENDED_RELEASE_TABLET | ORAL | Status: AC
  Administered 2021-12-28 (×2): 40 meq via ORAL
  Filled 2021-12-28 (×2): qty 2

## 2021-12-28 MED ORDER — METOPROLOL SUCCINATE ER 50 MG PO TB24
100.0000 mg | ORAL_TABLET | Freq: Every day | ORAL | Status: DC
  Administered 2021-12-28 – 2022-01-02 (×6): 100 mg via ORAL
  Filled 2021-12-28 (×6): qty 2

## 2021-12-28 MED ORDER — GADOBUTROL 1 MMOL/ML IV SOLN
7.0000 mL | Freq: Once | INTRAVENOUS | Status: AC | PRN
Start: 2021-12-28 — End: 2021-12-28
  Administered 2021-12-28: 7 mL via INTRAVENOUS

## 2021-12-28 NOTE — Progress Notes (Signed)
PROGRESS NOTE        PATIENT DETAILS Name: Lydia Graham Age: 86 y.o. Sex: female Date of Birth: Apr 18, 1936 Admit Date: 12/24/2021 Admitting Physician Bernadette Hoit, DO TKW:IOXB, Costella Hatcher, MD  Brief Summary: Patient is a 86 y.o.  female with history of persistent atrial fibrillation, GERD who was recently diagnosed with UTI-and started on Cipro (took for 1 day)-she was brought to the hospital for fever, altered mental status-and subsequently admitted to the hospitalist service.  She was started on IV antibiotics and IV fluids. She developed rapid atrial fibrillation. Cardiology consulted.    Significant events: 7/3>> diagnosed with UTI as outpatient-started on Cipro-but developed confusion-admit to [facility] 7/5>>transfer to SDU for rapid A fib. Cardiology consulted  Significant studies: 7/3>> no obvious PNA  Significant microbiology data: 7/3>> blood culture: No growth 7/4>> urine culture: No growth 7/5>>wound culture growing group A strep  Procedures: None  Consults: cardiology  Subjective: Had some shortness of breath with cough this morning, improved after bringing up some sputum.  Reports continued pain and drainage from her right forearm.  Has continued erythema.  Reports pain in her forearm when she contracts her fingers.  Objective: Vitals: Blood pressure (!) 163/89, pulse (!) 129, temperature 97.9 F (36.6 C), temperature source Oral, resp. rate (!) 29, height '5\' 7"'$  (1.702 m), weight 77.8 kg, SpO2 93 %.   Exam: Gen Exam:Alert awake-not in any distress HEENT:atraumatic, normocephalic Chest: coarse breath sounds at bases CVS:S1S2 irregular Abdomen:soft non tender, non distended.  Mild bilateral CVA tenderness. Extremities:right forearm is erythematous, swollen with wound draining clear fluid, she does have some lower extremity edema Neurology: Non focal Skin: Cellulitis as pictured below      Pertinent Labs/Radiology:    Latest  Ref Rng & Units 12/28/2021    3:53 AM 12/27/2021    9:39 AM 12/26/2021    4:52 AM  CBC  WBC 4.0 - 10.5 K/uL 10.5  15.9  18.8   Hemoglobin 12.0 - 15.0 g/dL 10.7  11.3  10.5   Hematocrit 36.0 - 46.0 % 31.7  34.5  32.3   Platelets 150 - 400 K/uL 271  264  220     Lab Results  Component Value Date   NA 133 (L) 12/28/2021   K 3.3 (L) 12/28/2021   CL 103 12/28/2021   CO2 23 12/28/2021      Assessment/Plan: Sepsis due to cellulitis of right forearm:  -Sepsis physiology improving -continue IV antibiotics -urine/blood cultures without growth -follow wound culture of right forearm  Acute metabolic encephalopathy: Due to sepsis/pyelonephritis-resolved-completely awake and alert.  AKI: Hemodynamically mediated-in the setting of sepsis-improved with IV fluids.  Acute respiratory failure with hypoxia Acute on chronic diastolic congestive heart failure -Likely precipitated by aggressive hydration in the setting of sepsis -oxygen requirements currently at 7L [place] -BNP elevated -She got 1 dose of IV Lasix on 7/6 with good urine output -We will give another dose today -try and wean off oxygen as tolerated  Hyponatremia: Due to dehydration-improving with IVF.  Persistent A-fib with RVR:  -likely precipitated by sepsis -cardiology following -currently on toprol (dose being adjusted) -on amiodarone infusion -HR currently uncontrolled -anticoagulated with eliquis  HTN: currently on Toprol-XL -continue to hold amlodipine and losartan to allow for further rate control.    GERD: Continue PPI  Right forearm laceration/cellulitis: Following accidental drop of a American  doll on her arm (several days PTA) -woc for dressings -ultrasound of right forearm does not show any clear abscess -continue on vancomycin and cefepime -keep elevated -wound culture showing group B strep, but superficial culture -WBC count improved, but still having low-grade temps, redness, pain in her forearm -Check x-ray  of forearm to evaluate for any subcutaneous gas -Discussed with orthopedics, Dr. Amedeo Kinsman.  Can consider MRI of the forearm although he did not feel that surgical intervention was indicated at this point  BMI: Estimated body mass index is 26.86 kg/m as calculated from the following:   Height as of this encounter: '5\' 7"'$  (1.702 m).   Weight as of this encounter: 77.8 kg.   Code status:   Code Status: Full Code   DVT Prophylaxis: SCDs Start: 12/25/21 0759 apixaban (ELIQUIS) tablet 5 mg    Family Communication: sister updated at the bedside   Disposition Plan: Status is: Inpatient Remains inpatient appropriate because: Resolving sepsis physiology-A-fib RVR-noted    Planned Discharge Destination:Home   Diet: Diet Order             Diet heart healthy/carb modified Room service appropriate? Yes; Fluid consistency: Thin  Diet effective now                     Antimicrobial agents: Anti-infectives (From admission, onward)    Start     Dose/Rate Route Frequency Ordered Stop   12/27/21 1200  vancomycin (VANCOREADY) IVPB 1250 mg/250 mL        1,250 mg 166.7 mL/hr over 90 Minutes Intravenous Every 24 hours 12/26/21 1052     12/26/21 2300  ceFEPIme (MAXIPIME) 2 g in sodium chloride 0.9 % 100 mL IVPB        2 g 200 mL/hr over 30 Minutes Intravenous Every 12 hours 12/26/21 1052     12/26/21 1145  vancomycin (VANCOCIN) IVPB 1000 mg/200 mL premix  Status:  Discontinued        1,000 mg 200 mL/hr over 60 Minutes Intravenous  Once 12/26/21 1046 12/26/21 1050   12/26/21 1145  vancomycin (VANCOREADY) IVPB 1500 mg/300 mL        1,500 mg 150 mL/hr over 120 Minutes Intravenous  Once 12/26/21 1050 12/26/21 1441   12/26/21 1130  ceFEPIme (MAXIPIME) 2 g in sodium chloride 0.9 % 100 mL IVPB        2 g 200 mL/hr over 30 Minutes Intravenous  Once 12/26/21 1046 12/26/21 1312   12/25/21 2200  cefTRIAXone (ROCEPHIN) 1 g in sodium chloride 0.9 % 100 mL IVPB  Status:  Discontinued        1  g 200 mL/hr over 30 Minutes Intravenous Every 24 hours 12/25/21 0749 12/26/21 1046   12/24/21 2215  cefTRIAXone (ROCEPHIN) 2 g in sodium chloride 0.9 % 100 mL IVPB        2 g 200 mL/hr over 30 Minutes Intravenous  Once 12/24/21 2207 12/24/21 2316        MEDICATIONS: Scheduled Meds:  apixaban  5 mg Oral BID   Chlorhexidine Gluconate Cloth  6 each Topical Daily   estradiol  0.5 mg Oral Daily   ezetimibe  10 mg Oral Daily   metoprolol succinate  100 mg Oral Daily   pantoprazole  40 mg Oral Daily   potassium chloride  40 mEq Oral Q4H   Continuous Infusions:  amiodarone 30 mg/hr (12/28/21 0738)   ceFEPime (MAXIPIME) IV 2 g (12/28/21 0920)   vancomycin Stopped (12/27/21 1500)  PRN Meds:.acetaminophen **OR** acetaminophen, acetaminophen, levalbuterol, metoprolol tartrate, ondansetron **OR** ondansetron (ZOFRAN) IV   I have personally reviewed following labs and imaging studies  LABORATORY DATA: CBC: Recent Labs  Lab 12/24/21 2158 12/25/21 0711 12/26/21 0452 12/27/21 0939 12/28/21 0353  WBC 25.7* 25.6* 18.8* 15.9* 10.5  NEUTROABS 21.2* 21.0*  --   --   --   HGB 12.5 11.8* 10.5* 11.3* 10.7*  HCT 37.2 35.7* 32.3* 34.5* 31.7*  MCV 92.1 93.0 93.4 92.5 90.8  PLT 280 252 220 264 889    Basic Metabolic Panel: Recent Labs  Lab 12/25/21 0711 12/26/21 0452 12/27/21 0058 12/27/21 0939 12/28/21 0353  NA 130* 131* 132* 129* 133*  K 3.9 3.7 3.6 3.6 3.3*  CL 102 102 105 104 103  CO2 20* 22 21* 19* 23  GLUCOSE 114* 115* 126* 130* 107*  BUN '21 14 11 10 11  '$ CREATININE 1.30* 0.99 0.97 0.89 0.97  CALCIUM 8.4* 8.1* 7.9* 7.9* 8.0*  MG  --   --  1.8  --   --     GFR: Estimated Creatinine Clearance: 46.4 mL/min (by C-G formula based on SCr of 0.97 mg/dL).  Liver Function Tests: Recent Labs  Lab 12/24/21 2158 12/25/21 0711 12/27/21 0058  AST '22 20 17  '$ ALT '14 12 13  '$ ALKPHOS 44 44 53  BILITOT 0.9 0.7 0.3  PROT 6.6 5.7* 5.6*  ALBUMIN 3.5 2.9* 2.5*   No results for  input(s): "LIPASE", "AMYLASE" in the last 168 hours. No results for input(s): "AMMONIA" in the last 168 hours.  Coagulation Profile: Recent Labs  Lab 12/24/21 2158  INR 1.4*    Cardiac Enzymes: No results for input(s): "CKTOTAL", "CKMB", "CKMBINDEX", "TROPONINI" in the last 168 hours.  BNP (last 3 results) No results for input(s): "PROBNP" in the last 8760 hours.  Lipid Profile: No results for input(s): "CHOL", "HDL", "LDLCALC", "TRIG", "CHOLHDL", "LDLDIRECT" in the last 72 hours.  Thyroid Function Tests: Recent Labs    12/27/21 0059  TSH 3.790    Anemia Panel: No results for input(s): "VITAMINB12", "FOLATE", "FERRITIN", "TIBC", "IRON", "RETICCTPCT" in the last 72 hours.  Urine analysis:    Component Value Date/Time   COLORURINE YELLOW 12/25/2021 0115   APPEARANCEUR CLEAR 12/25/2021 0115   LABSPEC 1.013 12/25/2021 0115   PHURINE 6.0 12/25/2021 0115   GLUCOSEU NEGATIVE 12/25/2021 0115   HGBUR SMALL (A) 12/25/2021 0115   BILIRUBINUR NEGATIVE 12/25/2021 0115   KETONESUR NEGATIVE 12/25/2021 0115   PROTEINUR NEGATIVE 12/25/2021 0115   NITRITE NEGATIVE 12/25/2021 0115   LEUKOCYTESUR NEGATIVE 12/25/2021 0115    Sepsis Labs: Lactic Acid, Venous    Component Value Date/Time   LATICACIDVEN 1.8 12/25/2021 0016    MICROBIOLOGY: Recent Results (from the past 240 hour(s))  Blood Culture (routine x 2)     Status: None (Preliminary result)   Collection Time: 12/24/21  9:58 PM   Specimen: BLOOD LEFT WRIST  Result Value Ref Range Status   Specimen Description BLOOD LEFT WRIST  Final   Special Requests   Final    BOTTLES DRAWN AEROBIC AND ANAEROBIC Blood Culture adequate volume   Culture   Final    NO GROWTH 3 DAYS Performed at [facility], [address], [place], [place] [address]    Report Status PENDING  Incomplete  Blood Culture (routine x 2)     Status: None (Preliminary result)   Collection Time: 12/24/21  9:58 PM   Specimen: BLOOD RIGHT FOREARM  Result Value  Ref Range Status  Specimen Description BLOOD RIGHT FOREARM  Final   Special Requests   Final    BOTTLES DRAWN AEROBIC AND ANAEROBIC Blood Culture adequate volume   Culture   Final    NO GROWTH 3 DAYS Performed at [facility], [address] [place], [place] [address]    Report Status PENDING  Incomplete  Urine Culture     Status: None   Collection Time: 12/25/21  1:15 AM   Specimen: Urine, Clean Catch  Result Value Ref Range Status   Specimen Description   Final    URINE, CLEAN CATCH Performed at [facility], [address], [place], [place] [address]    Special Requests   Final    NONE Performed at [facility], [address] [place], [place] [address]    Culture   Final    NO GROWTH Performed at [facility], [place] [address] [place], [place] [address]    Report Status 12/26/2021 FINAL  Final  Aerobic Culture w Gram Stain (superficial specimen)     Status: None   Collection Time: 12/26/21 11:03 AM   Specimen: Wound  Result Value Ref Range Status   Specimen Description   Final    WOUND Performed at [facility], [address] [place], [place] [address]    Special Requests   Final    NONE Performed at [facility], [address], [place], [place] [address]    Gram Stain NO WBC SEEN RARE GRAM POSITIVE COCCI IN PAIRS   Final   Culture   Final    RARE GROUP A STREP (S.PYOGENES) ISOLATED Beta hemolytic streptococci are predictably susceptible to penicillin and other beta lactams. Susceptibility testing not routinely performed. Performed at [facility], [place] [address] [place], [place] [address]    Report Status 12/28/2021 FINAL  Final  MRSA Next Gen by PCR, Nasal     Status: None   Collection Time: 12/26/21 12:22 PM   Specimen: Nasal Mucosa; Nasal Swab  Result Value Ref Range Status   MRSA by PCR Next Gen NOT DETECTED NOT DETECTED Final    Comment: (NOTE) The GeneXpert MRSA Assay (FDA approved for NASAL specimens only), is one component of a  comprehensive MRSA colonization surveillance program. It is not intended to diagnose MRSA infection nor to guide or monitor treatment for MRSA infections. Test performance is not FDA approved in patients less than 52 years old. Performed at [facility], [address] [place], [place] [address]     RADIOLOGY STUDIES/RESULTS: DG CHEST PORT 1 VIEW  Result Date: 12/27/2021 CLINICAL DATA:  Hypoxia. EXAM: PORTABLE CHEST 1 VIEW COMPARISON:  12/24/2021. FINDINGS: The heart is enlarged the mediastinal contour is within normal limits. Atherosclerotic calcification of the aorta is noted. Interstitial prominence is noted bilaterally. There is a small left pleural effusion with atelectasis or infiltrate. No pneumothorax. Degenerative changes are present in the thoracic spine. IMPRESSION: 1. Cardiomegaly. 2. Small left pleural effusion with atelectasis or infiltrate. Electronically Signed   By: Brett Fairy M.D.   On: 12/27/2021 01:15   [place] RT UPPER EXTREM LTD SOFT TISSUE NON VASCULAR  Result Date: 12/26/2021 CLINICAL DATA:  Right upper extremity edema. Fever and altered mental status. Urinary tract infection. EXAM: ULTRASOUND RIGHT UPPER EXTREMITY LIMITED TECHNIQUE: Ultrasound examination of the upper extremity soft tissues was performed in the area of clinical concern. COMPARISON:  None Available. FINDINGS: Infiltrative subcutaneous edema noted along the antecubital fossa and anterior forearm. No drainable fluid collection or visible  solid mass. No obvious occlusion of adjacent superficial vessels. IMPRESSION: 1. Infiltrative subcutaneous edema along the anterior forearm and antecubital region. Cellulitis is not excluded. No drainable abscess is currently observed. Electronically Signed   By: Van Clines M.D.   On: 12/26/2021 12:05     LOS: 3 days   Kathie Dike, MD  [facility]    To contact the attending provider between 7A-7P or the covering provider during after hours 7P-7A, please  log into the web site [email or web address] and access using universal [place] password for that web site. If you do not have the password, please call the hospital operator.  12/28/2021, 11:35 AM

## 2021-12-28 NOTE — Progress Notes (Signed)
Progress Note  Patient Name: Lydia Graham Date of Encounter: 12/28/2021  [facility] Cardiologist: [name], MD [name]  Subjective   Had some mucus production earlier this morning.  Feeling better.  Good output with Lasix dose yesterday.  Forearm still reddened  Inpatient Medications    Scheduled Meds:  apixaban  5 mg Oral BID   Chlorhexidine Gluconate Cloth  6 each Topical Daily   estradiol  0.5 mg Oral Daily   ezetimibe  10 mg Oral Daily   metoprolol succinate  75 mg Oral Daily   pantoprazole  40 mg Oral Daily   Continuous Infusions:  amiodarone 30 mg/hr (12/28/21 0738)   ceFEPime (MAXIPIME) IV Stopped (12/27/21 2225)   vancomycin Stopped (12/27/21 1500)   PRN Meds: acetaminophen **OR** acetaminophen, acetaminophen, levalbuterol, metoprolol tartrate, ondansetron **OR** ondansetron (ZOFRAN) IV   Vital Signs    Vitals:   12/28/21 0400 12/28/21 0500 12/28/21 0739 12/28/21 0835  BP:  134/60    Pulse: 95 (!) 169    Resp: (!) 23 20    Temp:  98.5 F (36.9 C) 97.9 F (36.6 C)   TempSrc:  Oral Oral   SpO2: 93% 92% 95% 94%  Weight:  77.8 kg    Height:        Intake/Output Summary (Last 24 hours) at 12/28/2021 0905 Last data filed at 12/28/2021 0835 Gross per 24 hour  Intake 1145 ml  Output 2550 ml  Net -1405 ml      12/28/2021    5:00 AM 12/27/2021    5:00 AM 12/26/2021   12:21 PM  Last 3 Weights  Weight (lbs) 171 lb 8.3 oz 170 lb 10.2 oz 166 lb 3.6 oz  Weight (kg) 77.8 kg 77.4 kg 75.4 kg      Telemetry    Atrial fibrillation heart rates up into the 130s while sleeping, heart rates still in the 110s to 120s.- Personally Reviewed  ECG    Atrial fibrillation RVR- Personally Reviewed  Physical Exam   GEN: No acute distress.   Neck: No JVD Cardiac: Irregularly irregular tachycardic, no murmurs, rubs, or gallops.  Respiratory: Clear to auscultation bilaterally. GI: Soft, nontender, non-distended  MS: No edema; No deformity.  Right forearm  wrapped but clearly erythematous Neuro:  Nonfocal  Psych: Normal affect   Labs    High Sensitivity Troponin:   Recent Labs  Lab 12/27/21 0058  TROPONINIHS 12     Chemistry Recent Labs  Lab 12/24/21 2158 12/25/21 0711 12/26/21 0452 12/27/21 0058 12/27/21 0939 12/28/21 0353  NA 125* 130*   < > 132* 129* 133*  K 4.1 3.9   < > 3.6 3.6 3.3*  CL 96* 102   < > 105 104 103  CO2 18* 20*   < > 21* 19* 23  GLUCOSE 143* 114*   < > 126* 130* 107*  BUN 26* 21   < > '11 10 11  '$ CREATININE 1.68* 1.30*   < > 0.97 0.89 0.97  CALCIUM 8.9 8.4*   < > 7.9* 7.9* 8.0*  MG  --   --   --  1.8  --   --   PROT 6.6 5.7*  --  5.6*  --   --   ALBUMIN 3.5 2.9*  --  2.5*  --   --   AST 22 20  --  17  --   --   ALT 14 12  --  13  --   --  ALKPHOS 44 44  --  53  --   --   BILITOT 0.9 0.7  --  0.3  --   --   GFRNONAA 30* 41*   < > 58* >60 58*  ANIONGAP 11 8   < > '6 6 7   '$ < > = values in this interval not displayed.    Lipids No results for input(s): "CHOL", "TRIG", "HDL", "LABVLDL", "LDLCALC", "CHOLHDL" in the last 168 hours.  Hematology Recent Labs  Lab 12/26/21 0452 12/27/21 0939 12/28/21 0353  WBC 18.8* 15.9* 10.5  RBC 3.46* 3.73* 3.49*  HGB 10.5* 11.3* 10.7*  HCT 32.3* 34.5* 31.7*  MCV 93.4 92.5 90.8  MCH 30.3 30.3 30.7  MCHC 32.5 32.8 33.8  RDW 14.1 14.0 14.2  PLT 220 264 271   Thyroid  Recent Labs  Lab 12/27/21 0059  TSH 3.790    BNP Recent Labs  Lab 12/27/21 0939 12/28/21 0353  BNP 1,074.0* 808.0*    DDimer No results for input(s): "DDIMER" in the last 168 hours.   Radiology    DG CHEST PORT 1 VIEW  Result Date: 12/27/2021 CLINICAL DATA:  Hypoxia. EXAM: PORTABLE CHEST 1 VIEW COMPARISON:  12/24/2021. FINDINGS: The heart is enlarged the mediastinal contour is within normal limits. Atherosclerotic calcification of the aorta is noted. Interstitial prominence is noted bilaterally. There is a small left pleural effusion with atelectasis or infiltrate. No pneumothorax.  Degenerative changes are present in the thoracic spine. IMPRESSION: 1. Cardiomegaly. 2. Small left pleural effusion with atelectasis or infiltrate. Electronically Signed   By: Brett Fairy M.D.   On: 12/27/2021 01:15   [place] RT UPPER EXTREM LTD SOFT TISSUE NON VASCULAR  Result Date: 12/26/2021 CLINICAL DATA:  Right upper extremity edema. Fever and altered mental status. Urinary tract infection. EXAM: ULTRASOUND RIGHT UPPER EXTREMITY LIMITED TECHNIQUE: Ultrasound examination of the upper extremity soft tissues was performed in the area of clinical concern. COMPARISON:  None Available. FINDINGS: Infiltrative subcutaneous edema noted along the antecubital fossa and anterior forearm. No drainable fluid collection or visible solid mass. No obvious occlusion of adjacent superficial vessels. IMPRESSION: 1. Infiltrative subcutaneous edema along the anterior forearm and antecubital region. Cellulitis is not excluded. No drainable abscess is currently observed. Electronically Signed   By: Van Clines M.D.   On: 12/26/2021 12:05    Cardiac Studies   Echocardiogram: 02/2021 IMPRESSIONS     1. Left ventricular ejection fraction, by estimation, is 65 to 70%. The  left ventricle has normal function. The left ventricle has no regional  wall motion abnormalities. Left ventricular diastolic parameters are  indeterminate.   2. Right ventricular systolic function is normal. The right ventricular  size is normal. There is mildly elevated pulmonary artery systolic  pressure.   3. Left atrial size was mildly dilated.   4. The mitral valve is normal in structure. Trivial mitral valve  regurgitation. No evidence of mitral stenosis.   5. The aortic valve is tricuspid. Aortic valve regurgitation is not  visualized. No aortic stenosis is present.   6. The inferior vena cava is normal in size with greater than 50%  respiratory variability, suggesting right atrial pressure of 3 mmHg.   Comparison(s): Previous Echo  was technically challenging due to A-fib. LV  EF was '@50'$ %, with moderate MR and TR, and biatrial enlargement.    Carotid Dopplers: 05/2021 Summary:  Right Carotid: Velocities in the right ICA are consistent with a 1-39%  stenosis.   Left Carotid: Velocities in the  left ICA are consistent with a 1-39%  stenosis.   Vertebrals:  Bilateral vertebral arteries demonstrate antegrade flow.  Subclavians: Normal flow hemodynamics were seen in bilateral subclavian               arteries.   Patient Profile     86 y.o. female with persistent atrial fibrillation on both amiodarone as well as Eliquis with carotid artery stenosis status post left carotid endarterectomy hypertension hyperlipidemia who presented with fever acute mental status changes and was septic from urosepsis as well as right forearm cellulitis.  We are seeing her because of atrial fibrillation with rapid ventricular response.  Assessment & Plan    Atrial fibrillation persistent with rapid ventricular response - She had a prior history of persistent atrial fibrillation and underwent cardioversion in 2019.  This was ultimately successful after being started on amiodarone.  She had been doing quite well up until now with her underlying illness. - Continue with amiodarone IV again today.  Heart rates on telemetry are fluctuating up into the 130s. - I am going to increase her metoprolol succinate from 75 mg up to 100 mg a day.  Blood pressure seems to be able to tolerate. -Hopefully she will convert chemically with the IV amiodarone as well as with her infection starting to clear.  If not, we may need to pursue DC cardioversion.  Carotid artery stenosis - Left CEA followed by vascular as an outpatient.  Minimal stenosis currently.  She is not on aspirin or antiplatelet because of anticoagulation needs with Eliquis.  She also has not been tolerant of statins. - We started Zetia 10 mg a day.  Consider PCSK9 inhibitor as  outpatient.  Hypertension - Improved today.  We are also increasing her metoprolol to 100 mg. -Consider restarting amlodipine soon.  Urosepsis - Has received vancomycin and cefepime.  Right forearm cellulitis - Vancomycin and cefepime per primary team.  States that this was a result of her 83-year-old family member American girl doll falling on her.  Hyponatremia/hypokalemia - Per primary team.  Lab work reviewed.     For questions or updates, please contact [place] Please consult [email or web address] for contact info under        Signed, Candee Furbish, MD  12/28/2021, 9:05 AM

## 2021-12-29 ENCOUNTER — Inpatient Hospital Stay (HOSPITAL_COMMUNITY): Payer: Medicare Other

## 2021-12-29 DIAGNOSIS — A419 Sepsis, unspecified organism: Secondary | ICD-10-CM | POA: Diagnosis not present

## 2021-12-29 DIAGNOSIS — G9341 Metabolic encephalopathy: Secondary | ICD-10-CM | POA: Diagnosis not present

## 2021-12-29 DIAGNOSIS — I482 Chronic atrial fibrillation, unspecified: Secondary | ICD-10-CM | POA: Diagnosis not present

## 2021-12-29 DIAGNOSIS — N179 Acute kidney failure, unspecified: Secondary | ICD-10-CM | POA: Diagnosis not present

## 2021-12-29 LAB — CBC
HCT: 32.5 % — ABNORMAL LOW (ref 36.0–46.0)
Hemoglobin: 10.7 g/dL — ABNORMAL LOW (ref 12.0–15.0)
MCH: 30.3 pg (ref 26.0–34.0)
MCHC: 32.9 g/dL (ref 30.0–36.0)
MCV: 92.1 fL (ref 80.0–100.0)
Platelets: 317 10*3/uL (ref 150–400)
RBC: 3.53 MIL/uL — ABNORMAL LOW (ref 3.87–5.11)
RDW: 14.3 % (ref 11.5–15.5)
WBC: 10.4 10*3/uL (ref 4.0–10.5)
nRBC: 0 % (ref 0.0–0.2)

## 2021-12-29 LAB — CULTURE, BLOOD (ROUTINE X 2)
Culture: NO GROWTH
Culture: NO GROWTH
Special Requests: ADEQUATE
Special Requests: ADEQUATE

## 2021-12-29 LAB — BASIC METABOLIC PANEL
Anion gap: 9 (ref 5–15)
BUN: 14 mg/dL (ref 8–23)
CO2: 22 mmol/L (ref 22–32)
Calcium: 8.2 mg/dL — ABNORMAL LOW (ref 8.9–10.3)
Chloride: 103 mmol/L (ref 98–111)
Creatinine, Ser: 1.05 mg/dL — ABNORMAL HIGH (ref 0.44–1.00)
GFR, Estimated: 52 mL/min — ABNORMAL LOW (ref >60)
Glucose, Bld: 188 mg/dL — ABNORMAL HIGH (ref 70–99)
Potassium: 4 mmol/L (ref 3.5–5.1)
Sodium: 134 mmol/L — ABNORMAL LOW (ref 135–145)

## 2021-12-29 LAB — MAGNESIUM: Magnesium: 1.9 mg/dL (ref 1.7–2.4)

## 2021-12-29 MED ORDER — FUROSEMIDE 10 MG/ML IJ SOLN
20.0000 mg | Freq: Once | INTRAMUSCULAR | Status: AC
  Administered 2021-12-29: 20 mg via INTRAVENOUS
  Filled 2021-12-29: qty 2

## 2021-12-29 NOTE — Progress Notes (Signed)
PROGRESS NOTE        PATIENT DETAILS Name: Lydia Graham Age: 86 y.o. Sex: female Date of Birth: [birth year] Admit Date: 12/24/2021 Admitting Physician Bernadette Hoit, DO YOV:ZCHY, Costella Hatcher, MD  Brief Summary: Patient is a 86 y.o.  female with history of persistent atrial fibrillation, GERD who was recently diagnosed with UTI-and started on Cipro (took for 1 day)-she was brought to the hospital for fever, altered mental status-and subsequently admitted to the hospitalist service.  She was started on IV antibiotics and IV fluids. She developed rapid atrial fibrillation. Cardiology consulted.    Significant events: 7/3>> diagnosed with UTI as outpatient-started on Cipro-but developed confusion-admit to [facility] 7/5>>transfer to SDU for rapid A fib. Cardiology consulted  Significant studies: 7/3>> no obvious PNA  Significant microbiology data: 7/3>> blood culture: No growth 7/4>> urine culture: No growth 7/5>>wound culture growing group A strep  Procedures: None  Consults: cardiology  Subjective: Feels the discomfort in her forearm is mildly better when she flexes her fingers.  She did have some shortness of breath overnight.  Objective: Vitals: Blood pressure (!) 154/69, pulse 69, temperature 98.6 F (37 C), temperature source Oral, resp. rate (!) 22, height '5\' 7"'$  (1.702 m), weight 77.7 kg, SpO2 92 %.   Exam: Gen Exam:Alert awake-not in any distress HEENT:atraumatic, normocephalic Chest: coarse breath sounds at bases CVS:S1S2 irregular Abdomen:soft non tender, non distended.  Mild bilateral CVA tenderness. Extremities:right forearm is erythematous, swollen with wound draining clear fluid, she does have some lower extremity edema Neurology: Non focal Skin: Persistent erythema over right forearm with wounds draining serous fluid      Pertinent Labs/Radiology:    Latest Ref Rng & Units 12/29/2021    3:42 AM 12/28/2021    3:53 AM 12/27/2021     9:39 AM  CBC  WBC 4.0 - 10.5 K/uL 10.4  10.5  15.9   Hemoglobin 12.0 - 15.0 g/dL 10.7  10.7  11.3   Hematocrit 36.0 - 46.0 % 32.5  31.7  34.5   Platelets 150 - 400 K/uL 317  271  264     Lab Results  Component Value Date   NA 134 (L) 12/29/2021   K 4.0 12/29/2021   CL 103 12/29/2021   CO2 22 12/29/2021      Assessment/Plan: Sepsis due to cellulitis of right forearm:  -Sepsis physiology improving -continue IV antibiotics -urine/blood cultures without growth -Wound culture shows group A strep  Acute metabolic encephalopathy: Due to sepsis/pyelonephritis-resolved-completely awake and alert.  AKI: Hemodynamically mediated-in the setting of sepsis-improved with IV fluids.  Acute respiratory failure with hypoxia Acute on chronic diastolic congestive heart failure -Likely precipitated by aggressive hydration in the setting of sepsis -oxygen requirements currently at 7L [place] -BNP elevated -CT chest shows persistent pulmonary edema and bilateral pleural effusions -We will give another dose of Lasix today -Update echocardiogram now she is back in sinus  Hyponatremia: Due to dehydration-improving with IVF.  Persistent A-fib with RVR:  -likely precipitated by sepsis -cardiology following -currently on toprol (dose being adjusted) -Fortunately, it appears she converted to sinus rhythm overnight 7/7 -We will continue amiodarone infusion for another 24 hours and will likely transition back to oral amio tomorrow -anticoagulated with eliquis  HTN: currently on Toprol-XL -continue to hold amlodipine and losartan to allow for further rate control.    GERD: Continue PPI  Right forearm laceration/cellulitis: Following accidental drop of a American doll on her arm (several days PTA) -woc for dressings -ultrasound of right forearm does not show any clear abscess -continue on vancomycin and cefepime -keep elevated -wound culture showing group B strep, but superficial culture -MRI did  not show any deep infection -Seen by Ortho, appreciate input, no surgical intervention needed at this time -Continue antibiotics, dressing changes, keep arm elevated  BMI: Estimated body mass index is 26.83 kg/m as calculated from the following:   Height as of this encounter: '5\' 7"'$  (1.702 m).   Weight as of this encounter: 77.7 kg.   Code status:   Code Status: Full Code   DVT Prophylaxis: SCDs Start: 12/25/21 0759 apixaban (ELIQUIS) tablet 5 mg    Family Communication: sister updated at the bedside   Disposition Plan: Status is: Inpatient Remains inpatient appropriate because: Resolving sepsis physiology-A-fib RVR-noted    Planned Discharge Destination:Home   Diet: Diet Order             Diet heart healthy/carb modified Room service appropriate? Yes; Fluid consistency: Thin  Diet effective now                     Antimicrobial agents: Anti-infectives (From admission, onward)    Start     Dose/Rate Route Frequency Ordered Stop   12/27/21 1200  vancomycin (VANCOREADY) IVPB 1250 mg/250 mL        1,250 mg 166.7 mL/hr over 90 Minutes Intravenous Every 24 hours 12/26/21 1052     12/26/21 2300  ceFEPIme (MAXIPIME) 2 g in sodium chloride 0.9 % 100 mL IVPB  Status:  Discontinued        2 g 200 mL/hr over 30 Minutes Intravenous Every 12 hours 12/26/21 1052 12/29/21 1031   12/26/21 1145  vancomycin (VANCOCIN) IVPB 1000 mg/200 mL premix  Status:  Discontinued        1,000 mg 200 mL/hr over 60 Minutes Intravenous  Once 12/26/21 1046 12/26/21 1050   12/26/21 1145  vancomycin (VANCOREADY) IVPB 1500 mg/300 mL        1,500 mg 150 mL/hr over 120 Minutes Intravenous  Once 12/26/21 1050 12/26/21 1441   12/26/21 1130  ceFEPIme (MAXIPIME) 2 g in sodium chloride 0.9 % 100 mL IVPB        2 g 200 mL/hr over 30 Minutes Intravenous  Once 12/26/21 1046 12/26/21 1312   12/25/21 2200  cefTRIAXone (ROCEPHIN) 1 g in sodium chloride 0.9 % 100 mL IVPB  Status:  Discontinued        1  g 200 mL/hr over 30 Minutes Intravenous Every 24 hours 12/25/21 0749 12/26/21 1046   12/24/21 2215  cefTRIAXone (ROCEPHIN) 2 g in sodium chloride 0.9 % 100 mL IVPB        2 g 200 mL/hr over 30 Minutes Intravenous  Once 12/24/21 2207 12/24/21 2316        MEDICATIONS: Scheduled Meds:  apixaban  5 mg Oral BID   Chlorhexidine Gluconate Cloth  6 each Topical Daily   estradiol  0.5 mg Oral Daily   ezetimibe  10 mg Oral Daily   furosemide  20 mg Intravenous Once   metoprolol succinate  100 mg Oral Daily   pantoprazole  40 mg Oral Daily   Continuous Infusions:  amiodarone 30 mg/hr (12/29/21 0911)   vancomycin 1,250 mg (12/29/21 1147)   PRN Meds:.acetaminophen **OR** acetaminophen, acetaminophen, levalbuterol, metoprolol tartrate, ondansetron **OR** ondansetron (ZOFRAN) IV   I have  personally reviewed following labs and imaging studies  LABORATORY DATA: CBC: Recent Labs  Lab 12/24/21 2158 12/25/21 0711 12/26/21 0452 12/27/21 0939 12/28/21 0353 12/29/21 0342  WBC 25.7* 25.6* 18.8* 15.9* 10.5 10.4  NEUTROABS 21.2* 21.0*  --   --   --   --   HGB 12.5 11.8* 10.5* 11.3* 10.7* 10.7*  HCT 37.2 35.7* 32.3* 34.5* 31.7* 32.5*  MCV 92.1 93.0 93.4 92.5 90.8 92.1  PLT 280 252 220 264 271 294    Basic Metabolic Panel: Recent Labs  Lab 12/26/21 0452 12/27/21 0058 12/27/21 0939 12/28/21 0353 12/29/21 0342  NA 131* 132* 129* 133* 134*  K 3.7 3.6 3.6 3.3* 4.0  CL 102 105 104 103 103  CO2 22 21* 19* 23 22  GLUCOSE 115* 126* 130* 107* 188*  BUN '14 11 10 11 14  '$ CREATININE 0.99 0.97 0.89 0.97 1.05*  CALCIUM 8.1* 7.9* 7.9* 8.0* 8.2*  MG  --  1.8  --   --  1.9    GFR: Estimated Creatinine Clearance: 42.8 mL/min (A) (by C-G formula based on SCr of 1.05 mg/dL (H)).  Liver Function Tests: Recent Labs  Lab 12/24/21 2158 12/25/21 0711 12/27/21 0058  AST '22 20 17  '$ ALT '14 12 13  '$ ALKPHOS 44 44 53  BILITOT 0.9 0.7 0.3  PROT 6.6 5.7* 5.6*  ALBUMIN 3.5 2.9* 2.5*   No results for  input(s): "LIPASE", "AMYLASE" in the last 168 hours. No results for input(s): "AMMONIA" in the last 168 hours.  Coagulation Profile: Recent Labs  Lab 12/24/21 2158  INR 1.4*    Cardiac Enzymes: No results for input(s): "CKTOTAL", "CKMB", "CKMBINDEX", "TROPONINI" in the last 168 hours.  BNP (last 3 results) No results for input(s): "PROBNP" in the last 8760 hours.  Lipid Profile: No results for input(s): "CHOL", "HDL", "LDLCALC", "TRIG", "CHOLHDL", "LDLDIRECT" in the last 72 hours.  Thyroid Function Tests: Recent Labs    12/27/21 0059  TSH 3.790    Anemia Panel: No results for input(s): "VITAMINB12", "FOLATE", "FERRITIN", "TIBC", "IRON", "RETICCTPCT" in the last 72 hours.  Urine analysis:    Component Value Date/Time   COLORURINE YELLOW 12/25/2021 0115   APPEARANCEUR CLEAR 12/25/2021 0115   LABSPEC 1.013 12/25/2021 0115   PHURINE 6.0 12/25/2021 0115   GLUCOSEU NEGATIVE 12/25/2021 0115   HGBUR SMALL (A) 12/25/2021 0115   BILIRUBINUR NEGATIVE 12/25/2021 0115   KETONESUR NEGATIVE 12/25/2021 0115   PROTEINUR NEGATIVE 12/25/2021 0115   NITRITE NEGATIVE 12/25/2021 0115   LEUKOCYTESUR NEGATIVE 12/25/2021 0115    Sepsis Labs: Lactic Acid, Venous    Component Value Date/Time   LATICACIDVEN 1.8 12/25/2021 0016    MICROBIOLOGY: Recent Results (from the past 240 hour(s))  Blood Culture (routine x 2)     Status: None   Collection Time: 12/24/21  9:58 PM   Specimen: BLOOD LEFT WRIST  Result Value Ref Range Status   Specimen Description BLOOD LEFT WRIST  Final   Special Requests   Final    BOTTLES DRAWN AEROBIC AND ANAEROBIC Blood Culture adequate volume   Culture   Final    NO GROWTH 5 DAYS Performed at [facility], [address] [place], [place] [address]    Report Status 12/29/2021 FINAL  Final  Blood Culture (routine x 2)     Status: None   Collection Time: 12/24/21  9:58 PM   Specimen: BLOOD RIGHT FOREARM  Result Value Ref Range Status   Specimen  Description BLOOD RIGHT FOREARM  Final  Special Requests   Final    BOTTLES DRAWN AEROBIC AND ANAEROBIC Blood Culture adequate volume   Culture   Final    NO GROWTH 5 DAYS Performed at [facility], [address], [place], [place] [address]    Report Status 12/29/2021 FINAL  Final  Urine Culture     Status: None   Collection Time: 12/25/21  1:15 AM   Specimen: Urine, Clean Catch  Result Value Ref Range Status   Specimen Description   Final    URINE, CLEAN CATCH Performed at [facility], [address], [place], [place] [address]    Special Requests   Final    NONE Performed at [facility], [address], [place], [place] [address]    Culture   Final    NO GROWTH Performed at [facility], [place] [address] [place], [place] [address]    Report Status 12/26/2021 FINAL  Final  Aerobic Culture w Gram Stain (superficial specimen)     Status: None   Collection Time: 12/26/21 11:03 AM   Specimen: Wound  Result Value Ref Range Status   Specimen Description   Final    WOUND Performed at [facility], [address], [place], [place] [address]    Special Requests   Final    NONE Performed at [facility], [address], [place], [place] [address]    Gram Stain NO WBC SEEN RARE GRAM POSITIVE COCCI IN PAIRS   Final   Culture   Final    RARE GROUP A STREP (S.PYOGENES) ISOLATED Beta hemolytic streptococci are predictably susceptible to penicillin and other beta lactams. Susceptibility testing not routinely performed. Performed at [facility], [place] [address] [place], [place] [address]    Report Status 12/28/2021 FINAL  Final  MRSA Next Gen by PCR, Nasal     Status: None   Collection Time: 12/26/21 12:22 PM   Specimen: Nasal Mucosa; Nasal Swab  Result Value Ref Range Status   MRSA by PCR Next Gen NOT DETECTED NOT DETECTED Final    Comment: (NOTE) The GeneXpert MRSA Assay (FDA approved for NASAL specimens only), is one component of a comprehensive MRSA colonization  surveillance program. It is not intended to diagnose MRSA infection nor to guide or monitor treatment for MRSA infections. Test performance is not FDA approved in patients less than 51 years old. Performed at [facility], [address], [place],  [address]     RADIOLOGY STUDIES/RESULTS: CT CHEST WO CONTRAST  Result Date: 12/29/2021 CLINICAL DATA:  Pneumonia EXAM: CT CHEST WITHOUT CONTRAST TECHNIQUE: Multidetector CT imaging of the chest was performed following the standard protocol without IV contrast. RADIATION DOSE REDUCTION: This exam was performed according to the departmental dose-optimization program which includes automated exposure control, adjustment of the mA and/or kV according to patient size and/or use of iterative reconstruction technique. COMPARISON:  None Available. FINDINGS: Cardiovascular: Aortic atherosclerosis. Cardiomegaly. No pericardial effusion. Mediastinum/Nodes: No enlarged mediastinal, hilar, or axillary lymph nodes. Small hiatal hernia. Thyroid gland, trachea, and esophagus demonstrate no significant findings. Lungs/Pleura: Moderate bilateral pleural effusions with associated atelectasis or consolidation. Diffuse interlobular septal thickening. Scattered heterogeneous and ground-glass airspace opacities throughout the lungs. Upper Abdomen: No acute abnormality. Musculoskeletal: No chest wall abnormality. No suspicious osseous lesions identified. IMPRESSION: 1. Moderate bilateral pleural effusions with associated atelectasis or consolidation. 2. Diffuse interlobular septal thickening consistent with pulmonary edema. 3. Scattered heterogeneous and ground-glass airspace opacities throughout the lungs, consistent with nonspecific infection or inflammation. 4.  Cardiomegaly. Aortic Atherosclerosis (ICD10-I70.0). Electronically Signed   By: Delanna Ahmadi M.D.   On: 12/29/2021 17:28   DG CHEST PORT 1 VIEW  Result Date: 12/29/2021 CLINICAL DATA:  Shortness of breath. EXAM:  PORTABLE CHEST 1 VIEW COMPARISON:  12/27/2021 FINDINGS: Similar retrocardiac left base collapse/consolidation with small left pleural effusion. Interval increase in streaky opacity at the right base, likely atelectasis. The cardio pericardial silhouette is enlarged. Interstitial markings are diffusely coarsened with chronic features. Telemetry leads overlie the chest. IMPRESSION: 1. Similar retrocardiac left base collapse/consolidation with small left pleural effusion. 2. Interval increase in streaky opacity at the right base, likely atelectasis. Electronically Signed   By: Misty Stanley M.D.   On: 12/29/2021 13:25   MR FOREARM RIGHT W WO CONTRAST  Result Date: 12/28/2021 CLINICAL DATA:  Cellulitis. EXAM: MRI OF THE RIGHT FOREARM WITHOUT AND WITH CONTRAST TECHNIQUE: Multiplanar, multisequence MR imaging of the right forearm was performed before and after the administration of intravenous contrast. CONTRAST:  81m GADAVIST GADOBUTROL 1 MMOL/ML IV SOLN COMPARISON:  Radiographs dated December 28, 2021 FINDINGS: Bones/Joint/Cartilage Marrow signal is within normal limits. No evidence of fracture or osteonecrosis. Ligaments Interosseous ligament between the radius and ulna is intact. Muscles and Tendons No intramuscular hematoma or fluid collection. Tendons of the flexor and extensor compartment are intact. Soft tissues Skin thickening and subcutaneous soft tissue edema prominent about the posterior aspect of the elbow and throughout the forearm. No drainable fluid collection or abscess. IMPRESSION: 1. Generalized skin thickening and subcutaneous soft tissue edema prominent about the posterior aspect of the elbow. No drainable fluid collection or abscess. 2. Muscles and tendons are intact. No evidence of intramuscular collection or tendon tear. 3. Marrow signal is within normal limits without evidence of osteomyelitis. Electronically Signed   By: IKeane PoliceD.O.   On: 12/28/2021 16:30   DG Forearm Right  Result Date:  12/28/2021 CLINICAL DATA:  Cellulitis EXAM: RIGHT FOREARM - 2 VIEW COMPARISON:  None Available. FINDINGS: No evidence of fracture or other focal bone lesions. Advanced degenerative changes of the first [facility]. Soft tissue edema of the forearm. IMPRESSION: No acute osseous abnormality. Electronically Signed   By: LYetta GlassmanM.D.   On: 12/28/2021 12:05     LOS: 4 days   JKathie Dike MD  [facility]    To contact the attending provider between 7A-7P or the covering provider during after hours 7P-7A, please log into the web site [email or web address] and access using universal [place] password for that web site. If you do not have the password, please call the hospital operator.  12/29/2021, 5:48 PM

## 2021-12-29 NOTE — Consult Note (Signed)
ORTHOPAEDIC CONSULTATION  REQUESTING PHYSICIAN: Kathie Dike, MD  ASSESSMENT AND PLAN: 86 y.o. female with the following: right forearm cellulitis  Orthopedics recommends admission to a medical service and we will provide consultation and follow along  No drainable fluid collection.  Continue with antibiotics and local wound care.  Should her symptoms change, or condition worsen, she can be reevaluated.  - Weight Bearing Status/Activity: Weightbearing as tolerated  - Additional recommended labs/tests: Consider CRP and ESR to monitor treatment.  -VTE Prophylaxis: As needed  - Pain control: As needed  - Follow-up plan: To be determined  -Procedures: None  Chief Complaint: Right forearm pain  HPI: Lydia Graham is a 86 y.o. female who presented to the emergency department earlier this week due to concern for developing infection.  She states that she was recently hit in the arm with a granddaughters doll.  Nothing concerning at that time.  Over the subsequent days, she started to develop a little bit of swelling.  After admission, she noticed that the swelling, redness and pain significantly worsened in her forearm.  Immediately prior to admission, she was having some issues, speech not making sense.  She also had some fevers and chills.  She was started on IV antibiotics, and there was some improvement.  However, she started to have some weeping, with persistent redness and I was consulted for further evaluation.  At one point, she was having a lot of pain in her forearm with motion of her fingers and wrist.  This has improved.  She is comfortable today.  Past Medical History:  Diagnosis Date   Arthritis    oa, all over - multiple areas    Carotid artery occlusion    Dysrhythmia    AFib    GERD (gastroesophageal reflux disease)    Headache    h/o migraines    Hypertension    PAF (paroxysmal atrial fibrillation) ([place])    a. multiple DCCV's in 2019 and eventually  required initiation of Amiodarone with successful DCCV after this. b. recurrent in 12/2021 while admitted for Urosepsis   Past Surgical History:  Procedure Laterality Date   ABDOMINAL HYSTERECTOMY     APPENDECTOMY     BACK SURGERY     CARDIOVERSION N/A 12/19/2017   Procedure: CARDIOVERSION;  Surgeon: Arnoldo Lenis, MD;  [facility];  Service: Endoscopy;  Laterality: N/A;   CARDIOVERSION N/A 12/26/2017   Procedure: CARDIOVERSION;  Surgeon: [name], MD;  Location: [place];  Service: Cardiovascular;  Laterality: N/A;   CARDIOVERSION N/A 01/26/2018   Procedure: CARDIOVERSION;  Surgeon: Jerline Pain, MD;  Location: [facility] ENDOSCOPY;  Service: Cardiovascular;  Laterality: N/A;   CAROTID ENDARTERECTOMY     CATARACT EXTRACTION W/PHACO Right 03/09/2021   Procedure: CATARACT EXTRACTION PHACO AND INTRAOCULAR LENS PLACEMENT with Placement of Corticosteroid ([place]);  Surgeon: [name], MD;  Location: [facility];  Service: Ophthalmology;  Laterality: Right;  CDE 9.16   CATARACT EXTRACTION W/PHACO Left 04/02/2021   Procedure: CATARACT EXTRACTION PHACO AND INTRAOCULAR LENS PLACEMENT LEFT EYE;  Surgeon: [name], MD;  Location: [facility];  Service: Ophthalmology;  Laterality: Left;  left CDE=6.75   ENDARTERECTOMY Left 03/27/2017   Procedure: ENDARTERECTOMY CAROTID LEFT;  Surgeon: [name], MD;  Location: [place];  Service: Vascular;  Laterality: Left;   INNER EAR SURGERY Right    puntured eardrum- repaired 2x's    KNEE ARTHROPLASTY     NASAL SINUS SURGERY     deviated septum  PATCH ANGIOPLASTY Left 03/27/2017   Procedure: PATCH ANGIOPLASTY LEFT CAROTID ARTERY USING Rueben Bash BIOLOGIC PATCH;  Surgeon: [name], MD;  Location: [place];  Service: Vascular;  Laterality: Left;   TONSILLECTOMY     TOTAL KNEE ARTHROPLASTY Right 10/23/2016   Procedure: RIGHT TOTAL KNEE ARTHROPLASTY;  Surgeon: [name], MD;  Location: [place];  Service: Orthopedics;   Laterality: Right;   Social History   Socioeconomic History   Marital status: Single    Spouse name: Not on file   Number of children: Not on file   Years of education: Not on file   Highest education level: Not on file  Occupational History   Not on file  Tobacco Use   Smoking status: Never   Smokeless tobacco: Never  Vaping Use   Vaping Use: Never used  Substance and Sexual Activity   Alcohol use: No   Drug use: No   Sexual activity: Not Currently    Birth control/protection: Surgical  Other Topics Concern   Not on file  Social History Narrative   Not on file   Social Determinants of Health   Financial Resource Strain: Not on file  Food Insecurity: Not on file  Transportation Needs: Not on file  Physical Activity: Not on file  Stress: Not on file  Social Connections: Not on file   Family History  Problem Relation Age of Onset   Stroke Mother    Rheum arthritis Sister    Allergies  Allergen Reactions   Rosuvastatin Other (See Comments)    MYALGIA, Muscle pain   Lisinopril Cough   Other Other (See Comments)    PAIN MEDICATIONS/ANESTHESIA  MADE BP DROP LOW    Singulair [Montelukast Sodium] Other (See Comments)    fatigue   Prior to Admission medications   Medication Sig Start Date End Date Taking? Authorizing Provider  amiodarone (PACERONE) 200 MG tablet TAKE 1/2 TABLET EVERY OTHER DAY. 11/07/21  Yes Allred, Jeneen Rinks, MD  amLODipine (NORVASC) 5 MG tablet TAKE 1 TABLET AT 8AM AND 1 TABLET AT 8PM. 09/25/21  Yes Branch, Alphonse Guild, MD  apixaban (ELIQUIS) 5 MG TABS tablet TAKE (1) TABLET TWICE DAILY. 08/15/21  Yes Allred, Jeneen Rinks, MD  Calcium Carbonate-Vitamin D (CALCIUM 600+D PO) Take 1 tablet by mouth daily.   Yes [provider]  Camphor-Menthol-Methyl Sal (SALONPAS) 3.06-29-08 % PTCH Place 1 patch onto the skin at bedtime as needed (foot pain).   Yes [provider]  estradiol (ESTRACE) 0.5 MG tablet Take 0.5 mg by mouth daily.  04/18/16  Yes [provider]  fluticasone (FLONASE) 50 MCG/ACT nasal spray Place 2 sprays into both nostrils daily as needed for allergies.    Yes [provider]  furosemide (LASIX) 20 MG tablet TAKE 1 TABLET BY MOUTH DAILY AS NEEDED FOR EDEMA (LEG SWELLING/SHORTNESS OF BREATH). 05/16/21  Yes Allred, Jeneen Rinks, MD  Hypromellose (ARTIFICIAL TEARS OP) Place 2 drops into both eyes 2 (two) times daily as needed (for dry eyes).   Yes [provider]  losartan (COZAAR) 50 MG tablet TAKE 1 TABLET AT 10 AM AND 1 TABLET AT 10 PM. 10/30/21  Yes Branch, Alphonse Guild, MD  magnesium hydroxide (MILK OF MAGNESIA) 400 MG/5ML suspension Take 30 mLs by mouth daily as needed for mild constipation.   Yes [provider]  metoprolol succinate (TOPROL-XL) 50 MG 24 hr tablet TAKE 1 TABLET AT LUNCH OR IMMEDIATELY FOLLOWING A MEAL. 11/07/21  Yes Allred, Jeneen Rinks, MD  pantoprazole (PROTONIX) 40  MG tablet TAKE (1) TABLET TWICE DAILY. 09/25/21  Yes Branch, Alphonse Guild, MD  potassium chloride SA (K-DUR,KLOR-CON) 20 MEQ tablet Take 1 tablet (20 mEq total) by mouth as needed (take on days you take your Furosemide). 07/29/18  Yes Herminio Commons, MD  vitamin B-12 (CYANOCOBALAMIN) 1000 MCG tablet Take 1,000 mcg by mouth daily.   Yes [provider]  predniSONE (DELTASONE) 10 MG tablet Take 1 tablet (10 mg total) by mouth daily with breakfast. Patient not taking: Reported on 12/25/2021 10/08/21   [name], MD   MR FOREARM RIGHT W WO CONTRAST  Result Date: 12/28/2021 CLINICAL DATA:  Cellulitis. EXAM: MRI OF THE RIGHT FOREARM WITHOUT AND WITH CONTRAST TECHNIQUE: Multiplanar, multisequence MR imaging of the right forearm was performed before and after the administration of intravenous contrast. CONTRAST:  26m GADAVIST GADOBUTROL 1 MMOL/ML IV SOLN COMPARISON:  Radiographs dated December 28, 2021 FINDINGS: Bones/Joint/Cartilage Marrow signal is within normal limits. No evidence of fracture or osteonecrosis. Ligaments Interosseous  ligament between the radius and ulna is intact. Muscles and Tendons No intramuscular hematoma or fluid collection. Tendons of the flexor and extensor compartment are intact. Soft tissues Skin thickening and subcutaneous soft tissue edema prominent about the posterior aspect of the elbow and throughout the forearm. No drainable fluid collection or abscess. IMPRESSION: 1. Generalized skin thickening and subcutaneous soft tissue edema prominent about the posterior aspect of the elbow. No drainable fluid collection or abscess. 2. Muscles and tendons are intact. No evidence of intramuscular collection or tendon tear. 3. Marrow signal is within normal limits without evidence of osteomyelitis. Electronically Signed   By: IKeane PoliceD.O.   On: 12/28/2021 16:30   DG Forearm Right  Result Date: 12/28/2021 CLINICAL DATA:  Cellulitis EXAM: RIGHT FOREARM - 2 VIEW COMPARISON:  None Available. FINDINGS: No evidence of fracture or other focal bone lesions. Advanced degenerative changes of the [facility]. Soft tissue edema of the forearm. IMPRESSION: No acute osseous abnormality. Electronically Signed   By: LYetta GlassmanM.D.   On: 12/28/2021 12:05    Family History Reviewed and non-contributory, no pertinent history of problems with bleeding or anesthesia    Review of Systems + fevers and chills No numbness or tingling No chest pain No shortness of breath No bowel or bladder dysfunction No GI distress No headaches No nausea No vomiting No diarrhea No loss of consciousness No change in vision    OBJECTIVE  Vitals:Patient Vitals for the past 8 hrs:  BP Temp Temp src Pulse Resp SpO2 Weight  12/29/21 0712 -- 98.3 F (36.8 C) Oral -- -- -- --  12/29/21 0638 -- 98.2 F (36.8 C) Oral -- -- -- --  12/29/21 0500 -- -- -- -- -- -- 77.7 kg  12/29/21 0400 (!) 145/56 98.2 F (36.8 C) Axillary 68 (!) 24 92 % --  12/29/21 0308 -- -- -- 67 (!) 22 92 % --  12/29/21 0300 (!) 139/50 -- -- 64 (!) 22 (!)  87 % --  12/29/21 0100 (!) 135/45 -- -- 63 (!) 21 93 % --  12/29/21 0027 -- -- -- (!) 125 20 93 % --  12/29/21 0003 -- 98.2 F (36.8 C) Oral -- -- -- --   General: Alert, no acute distress Cardiovascular: Warm extremities noted Respiratory: No cyanosis, no use of accessory musculature GI: No organomegaly, abdomen is soft and non-tender Skin: No lesions in the area of chief complaint other than those listed below in MSK exam.  Neurologic: Sensation intact distally save for the below mentioned MSK exam Psychiatric: Patient is competent for consent with normal mood and affect Lymphatic: No swelling obvious and reported other than the area involved in the exam below Extremities  RUE:           Test Results Imaging   X-ray of the right forearm negative  Right arm MRI with contrast  IMPRESSION: 1. Generalized skin thickening and subcutaneous soft tissue edema prominent about the posterior aspect of the elbow. No drainable fluid collection or abscess.   2. Muscles and tendons are intact. No evidence of intramuscular collection or tendon tear.   3. Marrow signal is within normal limits without evidence of osteomyelitis.    Labs cbc Recent Labs    12/28/21 0353 12/29/21 0342  WBC 10.5 10.4  HGB 10.7* 10.7*  HCT 31.7* 32.5*  PLT 271 317    Labs inflam No results for input(s): "CRP" in the last 72 hours.  Invalid input(s): "ESR"  Labs coag No results for input(s): "INR", "PTT" in the last 72 hours.  Invalid input(s): "PT"  Recent Labs    12/28/21 0353 12/29/21 0342  NA 133* 134*  K 3.3* 4.0  CL 103 103  CO2 23 22  GLUCOSE 107* 188*  BUN 11 14  CREATININE 0.97 1.05*  CALCIUM 8.0* 8.2*

## 2021-12-30 ENCOUNTER — Inpatient Hospital Stay (HOSPITAL_COMMUNITY): Payer: Medicare Other

## 2021-12-30 ENCOUNTER — Other Ambulatory Visit (HOSPITAL_COMMUNITY): Payer: Self-pay | Admitting: *Deleted

## 2021-12-30 DIAGNOSIS — I4891 Unspecified atrial fibrillation: Secondary | ICD-10-CM

## 2021-12-30 DIAGNOSIS — N179 Acute kidney failure, unspecified: Secondary | ICD-10-CM | POA: Diagnosis not present

## 2021-12-30 DIAGNOSIS — A419 Sepsis, unspecified organism: Secondary | ICD-10-CM | POA: Diagnosis not present

## 2021-12-30 DIAGNOSIS — I482 Chronic atrial fibrillation, unspecified: Secondary | ICD-10-CM | POA: Diagnosis not present

## 2021-12-30 DIAGNOSIS — G9341 Metabolic encephalopathy: Secondary | ICD-10-CM | POA: Diagnosis not present

## 2021-12-30 LAB — BRAIN NATRIURETIC PEPTIDE: B Natriuretic Peptide: 502 pg/mL — ABNORMAL HIGH (ref 0.0–100.0)

## 2021-12-30 LAB — BASIC METABOLIC PANEL
Anion gap: 7 (ref 5–15)
BUN: 16 mg/dL (ref 8–23)
CO2: 23 mmol/L (ref 22–32)
Calcium: 8.5 mg/dL — ABNORMAL LOW (ref 8.9–10.3)
Chloride: 104 mmol/L (ref 98–111)
Creatinine, Ser: 1.09 mg/dL — ABNORMAL HIGH (ref 0.44–1.00)
GFR, Estimated: 50 mL/min — ABNORMAL LOW (ref >60)
Glucose, Bld: 100 mg/dL — ABNORMAL HIGH (ref 70–99)
Potassium: 3.7 mmol/L (ref 3.5–5.1)
Sodium: 134 mmol/L — ABNORMAL LOW (ref 135–145)

## 2021-12-30 LAB — ECHOCARDIOGRAM COMPLETE
Area-P 1/2: 4.73 cm2
Height: 67 in
S' Lateral: 2.9 cm
Weight: 2550.28 oz

## 2021-12-30 LAB — VANCOMYCIN, TROUGH: Vancomycin Tr: 16 ug/mL (ref 15–20)

## 2021-12-30 MED ORDER — FUROSEMIDE 10 MG/ML IJ SOLN
20.0000 mg | Freq: Once | INTRAMUSCULAR | Status: AC
  Administered 2021-12-30: 20 mg via INTRAVENOUS
  Filled 2021-12-30: qty 2

## 2021-12-30 MED ORDER — SILVER SULFADIAZINE 1 % EX CREA
TOPICAL_CREAM | Freq: Every day | CUTANEOUS | Status: DC
Start: 2021-12-30 — End: 2022-01-02
  Administered 2021-12-31: 1 via TOPICAL
  Filled 2021-12-30: qty 50

## 2021-12-30 MED ORDER — POTASSIUM CHLORIDE CRYS ER 20 MEQ PO TBCR
40.0000 meq | EXTENDED_RELEASE_TABLET | Freq: Once | ORAL | Status: AC
  Administered 2021-12-30: 40 meq via ORAL
  Filled 2021-12-30: qty 2

## 2021-12-30 MED ORDER — AMIODARONE IV BOLUS ONLY 150 MG/100ML
150.0000 mg | Freq: Once | INTRAVENOUS | Status: AC
  Administered 2021-12-30: 150 mg via INTRAVENOUS
  Filled 2021-12-30: qty 100

## 2021-12-30 MED ORDER — SODIUM CHLORIDE 0.9 % IV BOLUS
1000.0000 mL | Freq: Once | INTRAVENOUS | Status: AC
  Administered 2021-12-30: 1000 mL via INTRAVENOUS

## 2021-12-30 MED ORDER — POLYETHYLENE GLYCOL 3350 17 G PO PACK
17.0000 g | PACK | Freq: Every day | ORAL | Status: DC
  Administered 2021-12-30 – 2022-01-01 (×3): 17 g via ORAL
  Filled 2021-12-30 (×4): qty 1

## 2021-12-30 MED ORDER — ATROPINE SULFATE 1 MG/10ML IJ SOSY
PREFILLED_SYRINGE | INTRAMUSCULAR | Status: AC
  Filled 2021-12-30: qty 10

## 2021-12-30 MED ORDER — ATROPINE SULFATE 1 MG/10ML IJ SOSY: 1.0000 mg | PREFILLED_SYRINGE | Freq: Once | INTRAMUSCULAR | Status: DC

## 2021-12-30 MED ORDER — MAGNESIUM HYDROXIDE 400 MG/5ML PO SUSP
30.0000 mL | Freq: Every day | ORAL | Status: DC | PRN
  Administered 2021-12-31 – 2022-01-01 (×2): 30 mL via ORAL
  Filled 2021-12-30 (×2): qty 30

## 2021-12-30 NOTE — Progress Notes (Signed)
*  PRELIMINARY RESULTS* Echocardiogram 2D Echocardiogram has been performed.  Lydia Graham 12/30/2021, 10:42 AM

## 2021-12-30 NOTE — Consult Note (Signed)
[facility] Nurse wound follow up  Reason for Consult:Right forearm with resolving cellulitis, friable tissue. Seen by my associate on 12/26/21 and xeroform gauze (antimicrobial nonadherent) was implanted.. Wound type:Infectious, trauma Pressure Injury POA: N/A Drainage (amount, consistency, odor) serous  in a small amount  Periwound:erythematous Dressing procedure/placement/frequency: POC adjusted after discussing with Provider and Bedside staff.  Friable tissue lifts with each  xeroform dressing change. Will implement a daily application of silver sulfadiazine cream (Silvadene) topped with saline moistened gauze, ABD, and secured with Kerlix/paper tape.  [place] nursing team will not follow, but will remain available to this patient, the nursing and medical teams.  Please re-consult if needed.  Thank you for inviting [place] to participate in this patient's Plan of Care.  Maudie Flakes, MSN, RN, CNS, [place], Serita Grammes, [employer], [employer] phone:  [phone]

## 2021-12-30 NOTE — Progress Notes (Signed)
Pharmacy Antibiotic Note  Lydia Graham a 86 y.o. female admitted on 12/30/2021 with positive wound culture and cellulitis.  Pharmacy has been consulted for vancomycin and cefepime dosing.  Plan: VT 16 Continue vancomycin 1250 mg IV every 24 hours. Cefepime 2000 mg IV every 12 hours.   Medical History: Past Medical History:  Diagnosis Date   Arthritis    oa, all over - multiple areas    Carotid artery occlusion    Dysrhythmia    AFib    GERD (gastroesophageal reflux disease)    Headache    h/o migraines    Hypertension    PAF (paroxysmal atrial fibrillation) ([place])    a. multiple DCCV's in 2019 and eventually required initiation of Amiodarone with successful DCCV after this. b. recurrent in 12/2021 while admitted for Urosepsis    Allergies:  Allergies  Allergen Reactions   Rosuvastatin Other (See Comments)    MYALGIA, Muscle pain   Lisinopril Cough   Other Other (See Comments)    PAIN MEDICATIONS/ANESTHESIA  MADE BP DROP LOW    Singulair [Montelukast Sodium] Other (See Comments)    fatigue    Filed Weights   12/28/21 0500 12/29/21 0500 12/30/21 0415  Weight: 77.8 kg (171 lb 8.3 oz) 77.7 kg (171 lb 4.8 oz) 72.3 kg (159 lb 6.3 oz)       Latest Ref Rng & Units 12/29/2021    3:42 AM 12/28/2021    3:53 AM 12/27/2021    9:39 AM  CBC  WBC 4.0 - 10.5 K/uL 10.4  10.5  15.9   Hemoglobin 12.0 - 15.0 g/dL 10.7  10.7  11.3   Hematocrit 36.0 - 46.0 % 32.5  31.7  34.5   Platelets 150 - 400 K/uL 317  271  264      Estimated Creatinine Clearance: 37.4 mL/min (A) (by C-G formula based on SCr of 1.09 mg/dL (H)).  Antibiotics Given (last 72 hours)     Date/Time Action Medication Dose Rate   12/27/21 1319 New Bag/Given   vancomycin (VANCOREADY) IVPB 1250 mg/250 mL 1,250 mg 166.7 mL/hr   12/27/21 2158 New Bag/Given   ceFEPIme (MAXIPIME) 2 g in sodium chloride 0.9 % 100 mL IVPB 2 g 200 mL/hr   12/28/21 0920 New Bag/Given   ceFEPIme (MAXIPIME) 2 g in sodium chloride 0.9 % 100 mL  IVPB 2 g 200 mL/hr   12/28/21 1220 New Bag/Given   vancomycin (VANCOREADY) IVPB 1250 mg/250 mL 1,250 mg 166.7 mL/hr   12/28/21 2153 New Bag/Given   ceFEPIme (MAXIPIME) 2 g in sodium chloride 0.9 % 100 mL IVPB 2 g 200 mL/hr   12/29/21 0940 New Bag/Given   ceFEPIme (MAXIPIME) 2 g in sodium chloride 0.9 % 100 mL IVPB 2 g 200 mL/hr   12/29/21 1147 New Bag/Given   vancomycin (VANCOREADY) IVPB 1250 mg/250 mL 1,250 mg 166.7 mL/hr       Antimicrobials this admission:  Cefepime 12/26/2021  >>  vancomycin 12/26/2021  >>   Microbiology results: 12/26/2021  BCx: ng 12/26/2021  UCx: ng 12/26/2021  MRSA PCR: negative  12/26/2021 Wound Culture: RARE GROUP A STREP - susceptible to penicillin and other beat lactams (S.PYOGENES) ISOLATED   Thank you for allowing pharmacy to be a part of this patient's care.  Ramond Craver, PharmD Clinical Pharmacist

## 2021-12-30 NOTE — Progress Notes (Signed)
PROGRESS NOTE        PATIENT DETAILS Name: Lydia Graham Age: 86 y.o. Sex: female Date of Birth: 23-Apr-1936 Admit Date: 12/24/2021 Admitting Physician Bernadette Hoit, DO [id number], Costella Hatcher, MD  Brief Summary: Patient is a 86 y.o.  female with history of persistent atrial fibrillation, GERD who was recently diagnosed with UTI-and started on Cipro (took for 1 day)-she was brought to the hospital for fever, altered mental status-and subsequently admitted to the hospitalist service.  She was started on IV antibiotics and IV fluids. She developed rapid atrial fibrillation. Cardiology consulted.    Significant events: 7/3>> diagnosed with UTI as outpatient-started on Cipro-but developed confusion-admit to [facility] 7/5>>transfer to SDU for rapid A fib. Cardiology consulted  Significant studies: 7/3>> no obvious PNA  Significant microbiology data: 7/3>> blood culture: No growth 7/4>> urine culture: No growth 7/5>>wound culture growing group A strep  Procedures: echo  Consults: Cardiology ortho  Subjective: Forearm pain is better as is swelling. Feels as though breathing is improving. Unfortunately went back into rapid A fib this morning  Objective: Vitals: Blood pressure 132/72, pulse (!) 106, temperature 98.7 F (37.1 C), temperature source Axillary, resp. rate 20, height '5\' 7"'$  (1.702 m), weight 72.3 kg, SpO2 (!) 86 %.   Exam: Gen Exam:Alert awake-not in any distress HEENT:atraumatic, normocephalic Chest: coarse breath sounds at bases with fair air movement CVS:S1S2 irregular Abdomen:soft non tender, non distended.  Mild bilateral CVA tenderness. Extremities:lower extremity edema improving Neurology: Non focal Skin: Persistent erythema over right forearm with wounds draining serous fluid. Overall swelling in arm is better.      Pertinent Labs/Radiology:    Latest Ref Rng & Units 12/29/2021    3:42 AM 12/28/2021    3:53 AM 12/27/2021    9:39 AM   CBC  WBC 4.0 - 10.5 K/uL 10.4  10.5  15.9   Hemoglobin 12.0 - 15.0 g/dL 10.7  10.7  11.3   Hematocrit 36.0 - 46.0 % 32.5  31.7  34.5   Platelets 150 - 400 K/uL 317  271  264     Lab Results  Component Value Date   NA 134 (L) 12/30/2021   K 3.7 12/30/2021   CL 104 12/30/2021   CO2 23 12/30/2021      Assessment/Plan: Sepsis due to cellulitis of right forearm:  -Sepsis physiology improving -continue IV antibiotics -urine/blood cultures without growth -Wound culture shows group A strep  Acute metabolic encephalopathy: Due to sepsis/pyelonephritis-resolved-completely awake and alert.  AKI: Hemodynamically mediated-in the setting of sepsis-improved with IV fluids.  Acute respiratory failure with hypoxia Acute on chronic diastolic congestive heart failure -Likely precipitated by aggressive hydration in the setting of sepsis -oxygen requirements currently at 3L [place] -BNP elevated but trending down -CT chest shows persistent pulmonary edema and bilateral pleural effusions -We will give another dose of Lasix today -repeat echocardiogram ordered yesterday since she was in sinus. Unfortunately, she was back in A fib when study was done -can consider thoracentesis if diuresis becomes challenging  Hyponatremia: Due to dehydration-improving with IVF.  Persistent A-fib with RVR:  -likely precipitated by sepsis -cardiology following -currently on toprol (dose being adjusted) -she converted to sinus rhythm overnight 7/7 -went back into A fib in morning of 7/9 -will give another bolus of amiodarone now -anticoagulated with eliquis  HTN: currently on Toprol-XL -continue to hold amlodipine and  losartan to allow for further rate control.    GERD: Continue PPI  Right forearm laceration/cellulitis: Following accidental drop of a American doll on her arm (several days PTA) -woc for dressings -ultrasound of right forearm does not show any clear abscess -continue on vancomycin and  cefepime -keep elevated -wound culture showing group B strep, but superficial culture -MRI did not show any deep infection -Seen by Ortho, appreciate input, no surgical intervention needed at this time -Continue antibiotics, dressing changes, keep arm elevated  BMI: Estimated body mass index is 24.96 kg/m as calculated from the following:   Height as of this encounter: '5\' 7"'$  (1.702 m).   Weight as of this encounter: 72.3 kg.   Code status:   Code Status: Full Code   DVT Prophylaxis: SCDs Start: 12/25/21 0759 apixaban (ELIQUIS) tablet 5 mg    Family Communication: sister and daughter updated at the bedside   Disposition Plan: Status is: Inpatient Remains inpatient appropriate because: Resolving sepsis physiology-A-fib RVR-noted    Planned Discharge Destination:Home   Diet: Diet Order             Diet heart healthy/carb modified Room service appropriate? Yes; Fluid consistency: Thin  Diet effective now                     Antimicrobial agents: Anti-infectives (From admission, onward)    Start     Dose/Rate Route Frequency Ordered Stop   12/27/21 1200  vancomycin (VANCOREADY) IVPB 1250 mg/250 mL        1,250 mg 166.7 mL/hr over 90 Minutes Intravenous Every 24 hours 12/26/21 1052     12/26/21 2300  ceFEPIme (MAXIPIME) 2 g in sodium chloride 0.9 % 100 mL IVPB  Status:  Discontinued        2 g 200 mL/hr over 30 Minutes Intravenous Every 12 hours 12/26/21 1052 12/29/21 1031   12/26/21 1145  vancomycin (VANCOCIN) IVPB 1000 mg/200 mL premix  Status:  Discontinued        1,000 mg 200 mL/hr over 60 Minutes Intravenous  Once 12/26/21 1046 12/26/21 1050   12/26/21 1145  vancomycin (VANCOREADY) IVPB 1500 mg/300 mL        1,500 mg 150 mL/hr over 120 Minutes Intravenous  Once 12/26/21 1050 12/26/21 1441   12/26/21 1130  ceFEPIme (MAXIPIME) 2 g in sodium chloride 0.9 % 100 mL IVPB        2 g 200 mL/hr over 30 Minutes Intravenous  Once 12/26/21 1046 12/26/21 1312    12/25/21 2200  cefTRIAXone (ROCEPHIN) 1 g in sodium chloride 0.9 % 100 mL IVPB  Status:  Discontinued        1 g 200 mL/hr over 30 Minutes Intravenous Every 24 hours 12/25/21 0749 12/26/21 1046   12/24/21 2215  cefTRIAXone (ROCEPHIN) 2 g in sodium chloride 0.9 % 100 mL IVPB        2 g 200 mL/hr over 30 Minutes Intravenous  Once 12/24/21 2207 12/24/21 2316        MEDICATIONS: Scheduled Meds:  apixaban  5 mg Oral BID   Chlorhexidine Gluconate Cloth  6 each Topical Daily   estradiol  0.5 mg Oral Daily   ezetimibe  10 mg Oral Daily   furosemide  20 mg Intravenous Once   metoprolol succinate  100 mg Oral Daily   pantoprazole  40 mg Oral Daily   polyethylene glycol  17 g Oral Daily   potassium chloride  40 mEq Oral Once  Continuous Infusions:  amiodarone 30 mg/hr (12/30/21 3149)   amiodarone     vancomycin Stopped (12/29/21 1255)   PRN Meds:.acetaminophen **OR** acetaminophen, acetaminophen, levalbuterol, magnesium hydroxide, metoprolol tartrate, ondansetron **OR** ondansetron (ZOFRAN) IV   I have personally reviewed following labs and imaging studies  LABORATORY DATA: CBC: Recent Labs  Lab 12/24/21 2158 12/25/21 0711 12/26/21 0452 12/27/21 0939 12/28/21 0353 12/29/21 0342  WBC 25.7* 25.6* 18.8* 15.9* 10.5 10.4  NEUTROABS 21.2* 21.0*  --   --   --   --   HGB 12.5 11.8* 10.5* 11.3* 10.7* 10.7*  HCT 37.2 35.7* 32.3* 34.5* 31.7* 32.5*  MCV 92.1 93.0 93.4 92.5 90.8 92.1  PLT 280 252 220 264 271 702    Basic Metabolic Panel: Recent Labs  Lab 12/27/21 0058 12/27/21 0939 12/28/21 0353 12/29/21 0342 12/30/21 0339  NA 132* 129* 133* 134* 134*  K 3.6 3.6 3.3* 4.0 3.7  CL 105 104 103 103 104  CO2 21* 19* '23 22 23  '$ GLUCOSE 126* 130* 107* 188* 100*  BUN '11 10 11 14 16  '$ CREATININE 0.97 0.89 0.97 1.05* 1.09*  CALCIUM 7.9* 7.9* 8.0* 8.2* 8.5*  MG 1.8  --   --  1.9  --     GFR: Estimated Creatinine Clearance: 37.4 mL/min (A) (by C-G formula based on SCr of 1.09 mg/dL  (H)).  Liver Function Tests: Recent Labs  Lab 12/24/21 2158 12/25/21 0711 12/27/21 0058  AST '22 20 17  '$ ALT '14 12 13  '$ ALKPHOS 44 44 53  BILITOT 0.9 0.7 0.3  PROT 6.6 5.7* 5.6*  ALBUMIN 3.5 2.9* 2.5*   No results for input(s): "LIPASE", "AMYLASE" in the last 168 hours. No results for input(s): "AMMONIA" in the last 168 hours.  Coagulation Profile: Recent Labs  Lab 12/24/21 2158  INR 1.4*    Cardiac Enzymes: No results for input(s): "CKTOTAL", "CKMB", "CKMBINDEX", "TROPONINI" in the last 168 hours.  BNP (last 3 results) No results for input(s): "PROBNP" in the last 8760 hours.  Lipid Profile: No results for input(s): "CHOL", "HDL", "LDLCALC", "TRIG", "CHOLHDL", "LDLDIRECT" in the last 72 hours.  Thyroid Function Tests: No results for input(s): "TSH", "T4TOTAL", "FREET4", "T3FREE", "THYROIDAB" in the last 72 hours.   Anemia Panel: No results for input(s): "VITAMINB12", "FOLATE", "FERRITIN", "TIBC", "IRON", "RETICCTPCT" in the last 72 hours.  Urine analysis:    Component Value Date/Time   COLORURINE YELLOW 12/25/2021 0115   APPEARANCEUR CLEAR 12/25/2021 0115   LABSPEC 1.013 12/25/2021 0115   PHURINE 6.0 12/25/2021 0115   GLUCOSEU NEGATIVE 12/25/2021 0115   HGBUR SMALL (A) 12/25/2021 0115   BILIRUBINUR NEGATIVE 12/25/2021 0115   KETONESUR NEGATIVE 12/25/2021 0115   PROTEINUR NEGATIVE 12/25/2021 0115   NITRITE NEGATIVE 12/25/2021 0115   LEUKOCYTESUR NEGATIVE 12/25/2021 0115    Sepsis Labs: Lactic Acid, Venous    Component Value Date/Time   LATICACIDVEN 1.8 12/25/2021 0016    MICROBIOLOGY: Recent Results (from the past 240 hour(s))  Blood Culture (routine x 2)     Status: None   Collection Time: 12/24/21  9:58 PM   Specimen: BLOOD LEFT WRIST  Result Value Ref Range Status   Specimen Description BLOOD LEFT WRIST  Final   Special Requests   Final    BOTTLES DRAWN AEROBIC AND ANAEROBIC Blood Culture adequate volume   Culture   Final    NO GROWTH 5  DAYS Performed at [facility], [address], [place],  [address]    Report Status 12/29/2021 FINAL  Final  Blood Culture (routine x 2)     Status: None   Collection Time: 12/24/21  9:58 PM   Specimen: BLOOD RIGHT FOREARM  Result Value Ref Range Status   Specimen Description BLOOD RIGHT FOREARM  Final   Special Requests   Final    BOTTLES DRAWN AEROBIC AND ANAEROBIC Blood Culture adequate volume   Culture   Final    NO GROWTH 5 DAYS Performed at [facility], [address] [place], [place] [address]    Report Status 12/29/2021 FINAL  Final  Urine Culture     Status: None   Collection Time: 12/25/21  1:15 AM   Specimen: Urine, Clean Catch  Result Value Ref Range Status   Specimen Description   Final    URINE, CLEAN CATCH Performed at [facility], [address], [place], [place] [address]    Special Requests   Final    NONE Performed at [name], [address], [place], [place] [address]    Culture   Final    NO GROWTH Performed at [facility], [place] [address] [place], [place] [address]    Report Status 12/26/2021 FINAL  Final  Aerobic Culture w Gram Stain (superficial specimen)     Status: None   Collection Time: 12/26/21 11:03 AM   Specimen: Wound  Result Value Ref Range Status   Specimen Description   Final    WOUND Performed at [facility], [address], [place], [place] [address]    Special Requests   Final    NONE Performed at [facility], [address] [place], [place] [address]    Gram Stain NO WBC SEEN RARE GRAM POSITIVE COCCI IN PAIRS   Final   Culture   Final    RARE GROUP A STREP (S.PYOGENES) ISOLATED Beta hemolytic streptococci are predictably susceptible to penicillin and other beta lactams. Susceptibility testing not routinely performed. Performed at [facility], [place] [address] [place], [place] [address]    Report Status 12/28/2021 FINAL  Final  MRSA Next Gen by PCR, Nasal     Status: None   Collection Time: 12/26/21  12:22 PM   Specimen: Nasal Mucosa; Nasal Swab  Result Value Ref Range Status   MRSA by PCR Next Gen NOT DETECTED NOT DETECTED Final    Comment: (NOTE) The GeneXpert MRSA Assay (FDA approved for NASAL specimens only), is one component of a comprehensive MRSA colonization surveillance program. It is not intended to diagnose MRSA infection nor to guide or monitor treatment for MRSA infections. Test performance is not FDA approved in patients less than 30 years old. Performed at [facility], [address], [place], [place] [address]     RADIOLOGY STUDIES/RESULTS: CT CHEST WO CONTRAST  Result Date: 12/29/2021 CLINICAL DATA:  Pneumonia EXAM: CT CHEST WITHOUT CONTRAST TECHNIQUE: Multidetector CT imaging of the chest was performed following the standard protocol without IV contrast. RADIATION DOSE REDUCTION: This exam was performed according to the departmental dose-optimization program which includes automated exposure control, adjustment of the mA and/or kV according to patient size and/or use of iterative reconstruction technique. COMPARISON:  None Available. FINDINGS: Cardiovascular: Aortic atherosclerosis. Cardiomegaly. No pericardial effusion. Mediastinum/Nodes: No enlarged mediastinal, hilar, or axillary lymph nodes. Small hiatal hernia. Thyroid gland, trachea, and esophagus demonstrate no significant findings. Lungs/Pleura: Moderate bilateral pleural effusions with associated atelectasis or consolidation. Diffuse interlobular septal thickening. Scattered heterogeneous and ground-glass airspace opacities throughout the lungs. Upper Abdomen: No acute abnormality. Musculoskeletal: No chest  wall abnormality. No suspicious osseous lesions identified. IMPRESSION: 1. Moderate bilateral pleural effusions with associated atelectasis or consolidation. 2. Diffuse interlobular septal thickening consistent with pulmonary edema. 3. Scattered heterogeneous and ground-glass airspace opacities throughout the lungs,  consistent with nonspecific infection or inflammation. 4. Cardiomegaly. Aortic Atherosclerosis (ICD10-I70.0). Electronically Signed   By: Delanna Ahmadi M.D.   On: 12/29/2021 17:28   DG CHEST PORT 1 VIEW  Result Date: 12/29/2021 CLINICAL DATA:  Shortness of breath. EXAM: PORTABLE CHEST 1 VIEW COMPARISON:  12/27/2021 FINDINGS: Similar retrocardiac left base collapse/consolidation with small left pleural effusion. Interval increase in streaky opacity at the right base, likely atelectasis. The cardio pericardial silhouette is enlarged. Interstitial markings are diffusely coarsened with chronic features. Telemetry leads overlie the chest. IMPRESSION: 1. Similar retrocardiac left base collapse/consolidation with small left pleural effusion. 2. Interval increase in streaky opacity at the right base, likely atelectasis. Electronically Signed   By: Misty Stanley M.D.   On: 12/29/2021 13:25   MR FOREARM RIGHT W WO CONTRAST  Result Date: 12/28/2021 CLINICAL DATA:  Cellulitis. EXAM: MRI OF THE RIGHT FOREARM WITHOUT AND WITH CONTRAST TECHNIQUE: Multiplanar, multisequence MR imaging of the right forearm was performed before and after the administration of intravenous contrast. CONTRAST:  74m GADAVIST GADOBUTROL 1 MMOL/ML IV SOLN COMPARISON:  Radiographs dated December 28, 2021 FINDINGS: Bones/Joint/Cartilage Marrow signal is within normal limits. No evidence of fracture or osteonecrosis. Ligaments Interosseous ligament between the radius and ulna is intact. Muscles and Tendons No intramuscular hematoma or fluid collection. Tendons of the flexor and extensor compartment are intact. Soft tissues Skin thickening and subcutaneous soft tissue edema prominent about the posterior aspect of the elbow and throughout the forearm. No drainable fluid collection or abscess. IMPRESSION: 1. Generalized skin thickening and subcutaneous soft tissue edema prominent about the posterior aspect of the elbow. No drainable fluid collection or abscess.  2. Muscles and tendons are intact. No evidence of intramuscular collection or tendon tear. 3. Marrow signal is within normal limits without evidence of osteomyelitis. Electronically Signed   By: IKeane PoliceD.O.   On: 12/28/2021 16:30     LOS: 5 days   JKathie Dike MD  [facility]    To contact the attending provider between 7A-7P or the covering provider during after hours 7P-7A, please log into the web site [email or web address] and access using universal [place] password for that web site. If you do not have the password, please call the hospital operator.  12/30/2021, 11:22 AM

## 2021-12-30 NOTE — Progress Notes (Signed)
Called into patient's room due to complaint of feeling "not right" before attempting to get up to the [facility]. Patient alert and verbal but appeared very flushed and weaker than before. BP 54/27 and HR in 50s. [name] contacted who arrived to bedside. EKG obtained confirmed SB. Amio gtt stopped and NS bolus administered. BP gradually increased and back to normal range shortly after starting bolus. Patient states she feels much better.

## 2021-12-31 DIAGNOSIS — N390 Urinary tract infection, site not specified: Secondary | ICD-10-CM | POA: Diagnosis not present

## 2021-12-31 DIAGNOSIS — I482 Chronic atrial fibrillation, unspecified: Secondary | ICD-10-CM | POA: Diagnosis not present

## 2021-12-31 DIAGNOSIS — N179 Acute kidney failure, unspecified: Secondary | ICD-10-CM | POA: Diagnosis not present

## 2021-12-31 DIAGNOSIS — G9341 Metabolic encephalopathy: Secondary | ICD-10-CM | POA: Diagnosis not present

## 2021-12-31 DIAGNOSIS — A419 Sepsis, unspecified organism: Secondary | ICD-10-CM | POA: Diagnosis not present

## 2021-12-31 LAB — CBC
HCT: 32 % — ABNORMAL LOW (ref 36.0–46.0)
Hemoglobin: 10.8 g/dL — ABNORMAL LOW (ref 12.0–15.0)
MCH: 30.9 pg (ref 26.0–34.0)
MCHC: 33.8 g/dL (ref 30.0–36.0)
MCV: 91.7 fL (ref 80.0–100.0)
Platelets: 496 10*3/uL — ABNORMAL HIGH (ref 150–400)
RBC: 3.49 MIL/uL — ABNORMAL LOW (ref 3.87–5.11)
RDW: 14.3 % (ref 11.5–15.5)
WBC: 10 10*3/uL (ref 4.0–10.5)
nRBC: 0 % (ref 0.0–0.2)

## 2021-12-31 LAB — BASIC METABOLIC PANEL
Anion gap: 5 (ref 5–15)
BUN: 14 mg/dL (ref 8–23)
CO2: 24 mmol/L (ref 22–32)
Calcium: 8.8 mg/dL — ABNORMAL LOW (ref 8.9–10.3)
Chloride: 107 mmol/L (ref 98–111)
Creatinine, Ser: 0.97 mg/dL (ref 0.44–1.00)
GFR, Estimated: 58 mL/min — ABNORMAL LOW (ref >60)
Glucose, Bld: 89 mg/dL (ref 70–99)
Potassium: 4 mmol/L (ref 3.5–5.1)
Sodium: 136 mmol/L (ref 135–145)

## 2021-12-31 LAB — BRAIN NATRIURETIC PEPTIDE: B Natriuretic Peptide: 415 pg/mL — ABNORMAL HIGH (ref 0.0–100.0)

## 2021-12-31 LAB — MAGNESIUM: Magnesium: 1.9 mg/dL (ref 1.7–2.4)

## 2021-12-31 MED ORDER — AMIODARONE HCL 200 MG PO TABS
100.0000 mg | ORAL_TABLET | ORAL | Status: DC
  Administered 2021-12-31: 100 mg via ORAL
  Filled 2021-12-31: qty 1

## 2021-12-31 MED ORDER — AMIODARONE HCL 200 MG PO TABS
200.0000 mg | ORAL_TABLET | Freq: Two times a day (BID) | ORAL | Status: DC
  Administered 2021-12-31 – 2022-01-02 (×4): 200 mg via ORAL
  Filled 2021-12-31 (×4): qty 1

## 2021-12-31 MED ORDER — FUROSEMIDE 10 MG/ML IJ SOLN
20.0000 mg | Freq: Once | INTRAMUSCULAR | Status: AC
  Administered 2021-12-31: 20 mg via INTRAVENOUS
  Filled 2021-12-31: qty 2

## 2021-12-31 MED ORDER — HYDROCODONE-ACETAMINOPHEN 5-325 MG PO TABS
0.5000 | ORAL_TABLET | Freq: Four times a day (QID) | ORAL | Status: DC | PRN
  Administered 2021-12-31 (×2): 0.5 via ORAL
  Filled 2021-12-31 (×2): qty 1

## 2021-12-31 NOTE — TOC Progression Note (Signed)
Transition of Care [facility]) - Progression Note    Patient Details  Name: Lydia Graham MRN: [id number] Date of Birth: 08/24/1935  Transition of [facility] CM/SW Contact  Boneta Lucks, RN Phone Number: 12/31/2021, 2:46 PM  Clinical Narrative:   PT recommending SNF. Patient is now agreeable to home health at discharge. TOC to follow for orders and set up, possibly discharging home tomorrow.    Expected Discharge Plan: [place] Barriers to Discharge: Continued Medical Work up  Expected Discharge Plan and Services Expected Discharge Plan: [place] arrangements for the past 2 months: [place]

## 2021-12-31 NOTE — Plan of Care (Signed)
  Problem: Acute Rehab PT Goals(only PT should resolve) Goal: Pt Will Go Supine/Side To Sit Outcome: Progressing Flowsheets (Taken 12/31/2021 1422) Pt will go Supine/Side to Sit: with modified independence Goal: Patient Will Transfer Sit To/From Stand Outcome: Progressing Flowsheets (Taken 12/31/2021 1422) Patient will transfer sit to/from stand:  with modified independence  with supervision Goal: Pt Will Transfer Bed To Chair/Chair To Bed Outcome: Progressing Flowsheets (Taken 12/31/2021 1422) Pt will Transfer Bed to Chair/Chair to Bed:  with modified independence  with supervision Goal: Pt Will Perform Standing Balance Or Pre-Gait Outcome: Progressing Flowsheets (Taken 12/31/2021 1422) Pt will perform standing balance or pre-gait:  with Modified Independent  with Supervision  3- 5 min  with bilateral UE support Goal: Pt Will Ambulate Outcome: Progressing Flowsheets (Taken 12/31/2021 1422) Pt will Ambulate:  100 feet  with min guard assist  with minimal assist  with rolling walker   2:22 PM, 12/31/21 Lestine Box, S/PT

## 2021-12-31 NOTE — Progress Notes (Signed)
PROGRESS NOTE        PATIENT DETAILS Name: Lydia Graham Age: 86 y.o. Sex: female Date of Birth: April 30, 1936 Admit Date: 12/24/2021 Admitting Physician Bernadette Hoit, DO SJG:GEZM, Costella Hatcher, MD  Brief Summary: Patient is a 86 y.o.  female with history of persistent atrial fibrillation, GERD who was recently diagnosed with UTI-and started on Cipro (took for 1 day)-she was brought to the hospital for fever, altered mental status-and subsequently admitted to the hospitalist service.  She was started on IV antibiotics and IV fluids. She developed rapid atrial fibrillation. Cardiology consulted.    Significant events: 7/3>> diagnosed with UTI as outpatient-started on Cipro-but developed confusion-admit to [facility] 7/5>>transfer to SDU for rapid A fib. Cardiology consulted  Significant studies: 7/3>> no obvious PNA  Significant microbiology data: 7/3>> blood culture: No growth 7/4>> urine culture: No growth 7/5>>wound culture growing group A strep  Procedures: echo  Consults: Cardiology ortho  Subjective: Forearm pain is better as is swelling. Feels as though breathing is improving. Unfortunately went back into rapid A fib this morning  Objective: Vitals: Blood pressure (!) 167/79, pulse 74, temperature 98.6 F (37 C), resp. rate 20, height '5\' 7"'$  (1.702 m), weight 72.3 kg, SpO2 94 %.   Exam: Gen Exam:Alert awake-not in any distress HEENT:atraumatic, normocephalic Chest: coarse breath sounds at bases with fair air movement CVS:S1S2 irregular Abdomen:soft non tender, non distended.  Mild bilateral CVA tenderness. Extremities:lower extremity edema improving Neurology: Non focal Skin: Persistent erythema over right forearm with wounds draining serous fluid. Overall swelling in arm is better.      Pertinent Labs/Radiology:    Latest Ref Rng & Units 12/31/2021    3:33 AM 12/29/2021    3:42 AM 12/28/2021    3:53 AM  CBC  WBC 4.0 - 10.5 K/uL 10.0   10.4  10.5   Hemoglobin 12.0 - 15.0 g/dL 10.8  10.7  10.7   Hematocrit 36.0 - 46.0 % 32.0  32.5  31.7   Platelets 150 - 400 K/uL 496  317  271     Lab Results  Component Value Date   NA 136 12/31/2021   K 4.0 12/31/2021   CL 107 12/31/2021   CO2 24 12/31/2021      Assessment/Plan: Sepsis due to cellulitis of right forearm:  -Sepsis physiology improving -continue IV antibiotics -urine/blood cultures without growth -Wound culture shows group A strep  Acute metabolic encephalopathy: Due to sepsis/pyelonephritis-resolved-completely awake and alert.  AKI: Hemodynamically mediated-in the setting of sepsis-improved with IV fluids.  Acute respiratory failure with hypoxia Acute on chronic diastolic congestive heart failure -Likely precipitated by aggressive hydration in the setting of sepsis -oxygen requirements currently at 3L [place] -BNP elevated but trending down -CT chest shows persistent pulmonary edema and bilateral pleural effusions -We will give another dose of Lasix today -repeat echocardiogram ordered yesterday since she was in sinus. Unfortunately, she was back in A fib when study was done -can consider thoracentesis if diuresis becomes challenging  Hyponatremia: Due to dehydration-improving with IVF.  Persistent A-fib with RVR:  -likely precipitated by sepsis -cardiology following -currently on toprol (dose being adjusted) -she converted to sinus rhythm overnight 7/7 -went back into A fib in morning of 7/9 -After receiving another amiodarone bolus, she became bradycardic and hypotensive.  This improved after receiving IV fluids -Appears to be maintaining sinus rhythm -Started back  on oral amiodarone -anticoagulated with eliquis  HTN: currently on Toprol-XL -continue to hold amlodipine and losartan to allow for further rate control.    GERD: Continue PPI  Right forearm laceration/cellulitis: Following accidental drop of a American doll on her arm (several days  PTA) -woc for dressings -ultrasound of right forearm does not show any clear abscess -continue on vancomycin  -keep elevated -wound culture showing group B strep, but superficial culture -MRI did not show any deep infection -Seen by Ortho, appreciate input, no surgical intervention needed at this time -Continue antibiotics, dressing changes, keep arm elevated  Cardiomyopathy -Ejection fraction 45 to 50% -Suspect decline in ejection fraction since prior study impacted by sepsis -Cardiology following -She is on beta-blockers -?  ARB versus Entresto -She does have some volume overload, will continue with IV Lasix  BMI: Estimated body mass index is 24.96 kg/m as calculated from the following:   Height as of this encounter: '5\' 7"'$  (1.702 m).   Weight as of this encounter: 72.3 kg.   Code status:   Code Status: Full Code   DVT Prophylaxis: SCDs Start: 12/25/21 0759 apixaban (ELIQUIS) tablet 5 mg    Family Communication: sister and daughter updated at the bedside   Disposition Plan: Status is: Inpatient Remains inpatient appropriate because: Resolving sepsis physiology-A-fib RVR-noted    Planned Discharge Destination:Home   Diet: Diet Order             Diet heart healthy/carb modified Room service appropriate? Yes; Fluid consistency: Thin  Diet effective now                     Antimicrobial agents: Anti-infectives (From admission, onward)    Start     Dose/Rate Route Frequency Ordered Stop   12/27/21 1200  vancomycin (VANCOREADY) IVPB 1250 mg/250 mL        1,250 mg 166.7 mL/hr over 90 Minutes Intravenous Every 24 hours 12/26/21 1052     12/26/21 2300  ceFEPIme (MAXIPIME) 2 g in sodium chloride 0.9 % 100 mL IVPB  Status:  Discontinued        2 g 200 mL/hr over 30 Minutes Intravenous Every 12 hours 12/26/21 1052 12/29/21 1031   12/26/21 1145  vancomycin (VANCOCIN) IVPB 1000 mg/200 mL premix  Status:  Discontinued        1,000 mg 200 mL/hr over 60 Minutes  Intravenous  Once 12/26/21 1046 12/26/21 1050   12/26/21 1145  vancomycin (VANCOREADY) IVPB 1500 mg/300 mL        1,500 mg 150 mL/hr over 120 Minutes Intravenous  Once 12/26/21 1050 12/26/21 1441   12/26/21 1130  ceFEPIme (MAXIPIME) 2 g in sodium chloride 0.9 % 100 mL IVPB        2 g 200 mL/hr over 30 Minutes Intravenous  Once 12/26/21 1046 12/26/21 1312   12/25/21 2200  cefTRIAXone (ROCEPHIN) 1 g in sodium chloride 0.9 % 100 mL IVPB  Status:  Discontinued        1 g 200 mL/hr over 30 Minutes Intravenous Every 24 hours 12/25/21 0749 12/26/21 1046   12/24/21 2215  cefTRIAXone (ROCEPHIN) 2 g in sodium chloride 0.9 % 100 mL IVPB        2 g 200 mL/hr over 30 Minutes Intravenous  Once 12/24/21 2207 12/24/21 2316        MEDICATIONS: Scheduled Meds:  amiodarone  200 mg Oral BID   apixaban  5 mg Oral BID   Chlorhexidine Gluconate Cloth  6 each  Topical Daily   estradiol  0.5 mg Oral Daily   ezetimibe  10 mg Oral Daily   metoprolol succinate  100 mg Oral Daily   pantoprazole  40 mg Oral Daily   polyethylene glycol  17 g Oral Daily   silver sulfADIAZINE   Topical Daily   Continuous Infusions:  vancomycin 1,250 mg (12/31/21 1210)   PRN Meds:.acetaminophen **OR** acetaminophen, acetaminophen, HYDROcodone-acetaminophen, levalbuterol, magnesium hydroxide, metoprolol tartrate, ondansetron **OR** ondansetron (ZOFRAN) IV   I have personally reviewed following labs and imaging studies  LABORATORY DATA: CBC: Recent Labs  Lab 12/24/21 2158 12/25/21 0711 12/26/21 0452 12/27/21 0939 12/28/21 0353 12/29/21 0342 12/31/21 0333  WBC 25.7* 25.6* 18.8* 15.9* 10.5 10.4 10.0  NEUTROABS 21.2* 21.0*  --   --   --   --   --   HGB 12.5 11.8* 10.5* 11.3* 10.7* 10.7* 10.8*  HCT 37.2 35.7* 32.3* 34.5* 31.7* 32.5* 32.0*  MCV 92.1 93.0 93.4 92.5 90.8 92.1 91.7  PLT 280 252 220 264 271 317 496*    Basic Metabolic Panel: Recent Labs  Lab 12/27/21 0058 12/27/21 0939 12/28/21 0353 12/29/21 0342  12/30/21 0339 12/31/21 0333  NA 132* 129* 133* 134* 134* 136  K 3.6 3.6 3.3* 4.0 3.7 4.0  CL 105 104 103 103 104 107  CO2 21* 19* '23 22 23 24  '$ GLUCOSE 126* 130* 107* 188* 100* 89  BUN '11 10 11 14 16 14  '$ CREATININE 0.97 0.89 0.97 1.05* 1.09* 0.97  CALCIUM 7.9* 7.9* 8.0* 8.2* 8.5* 8.8*  MG 1.8  --   --  1.9  --  1.9    GFR: Estimated Creatinine Clearance: 42 mL/min (by C-G formula based on SCr of 0.97 mg/dL).  Liver Function Tests: Recent Labs  Lab 12/24/21 2158 12/25/21 0711 12/27/21 0058  AST '22 20 17  '$ ALT '14 12 13  '$ ALKPHOS 44 44 53  BILITOT 0.9 0.7 0.3  PROT 6.6 5.7* 5.6*  ALBUMIN 3.5 2.9* 2.5*   No results for input(s): "LIPASE", "AMYLASE" in the last 168 hours. No results for input(s): "AMMONIA" in the last 168 hours.  Coagulation Profile: Recent Labs  Lab 12/24/21 2158  INR 1.4*    Cardiac Enzymes: No results for input(s): "CKTOTAL", "CKMB", "CKMBINDEX", "TROPONINI" in the last 168 hours.  BNP (last 3 results) No results for input(s): "PROBNP" in the last 8760 hours.  Lipid Profile: No results for input(s): "CHOL", "HDL", "LDLCALC", "TRIG", "CHOLHDL", "LDLDIRECT" in the last 72 hours.  Thyroid Function Tests: No results for input(s): "TSH", "T4TOTAL", "FREET4", "T3FREE", "THYROIDAB" in the last 72 hours.   Anemia Panel: No results for input(s): "VITAMINB12", "FOLATE", "FERRITIN", "TIBC", "IRON", "RETICCTPCT" in the last 72 hours.  Urine analysis:    Component Value Date/Time   COLORURINE YELLOW 12/25/2021 0115   APPEARANCEUR CLEAR 12/25/2021 0115   LABSPEC 1.013 12/25/2021 0115   PHURINE 6.0 12/25/2021 0115   GLUCOSEU NEGATIVE 12/25/2021 0115   HGBUR SMALL (A) 12/25/2021 0115   BILIRUBINUR NEGATIVE 12/25/2021 0115   KETONESUR NEGATIVE 12/25/2021 0115   PROTEINUR NEGATIVE 12/25/2021 0115   NITRITE NEGATIVE 12/25/2021 0115   LEUKOCYTESUR NEGATIVE 12/25/2021 0115    Sepsis Labs: Lactic Acid, Venous    Component Value Date/Time    LATICACIDVEN 1.8 12/25/2021 0016    MICROBIOLOGY: Recent Results (from the past 240 hour(s))  Blood Culture (routine x 2)     Status: None   Collection Time: 12/24/21  9:58 PM   Specimen: BLOOD LEFT WRIST  Result Value  Ref Range Status   Specimen Description BLOOD LEFT WRIST  Final   Special Requests   Final    BOTTLES DRAWN AEROBIC AND ANAEROBIC Blood Culture adequate volume   Culture   Final    NO GROWTH 5 DAYS Performed at [facility], [address] [place], [place] [address]    Report Status 12/29/2021 FINAL  Final  Blood Culture (routine x 2)     Status: None   Collection Time: 12/24/21  9:58 PM   Specimen: BLOOD RIGHT FOREARM  Result Value Ref Range Status   Specimen Description BLOOD RIGHT FOREARM  Final   Special Requests   Final    BOTTLES DRAWN AEROBIC AND ANAEROBIC Blood Culture adequate volume   Culture   Final    NO GROWTH 5 DAYS Performed at [facility], [address] [place], [place] [address]    Report Status 12/29/2021 FINAL  Final  Urine Culture     Status: None   Collection Time: 12/25/21  1:15 AM   Specimen: Urine, Clean Catch  Result Value Ref Range Status   Specimen Description   Final    URINE, CLEAN CATCH Performed at [facility], [address], [place], [place] [address]    Special Requests   Final    NONE Performed at [facility], [address] [place], [place] [address]    Culture   Final    NO GROWTH Performed at [facility], [place] [address] [place], [place] [address]    Report Status 12/26/2021 FINAL  Final  Aerobic Culture w Gram Stain (superficial specimen)     Status: None   Collection Time: 12/26/21 11:03 AM   Specimen: Wound  Result Value Ref Range Status   Specimen Description   Final    WOUND Performed at [facility], [address], [place], [place] [address]    Special Requests   Final    NONE Performed at [facility], [address] [place], [place] [address]    Gram Stain NO WBC SEEN RARE GRAM POSITIVE  COCCI IN PAIRS   Final   Culture   Final    RARE GROUP A STREP (S.PYOGENES) ISOLATED Beta hemolytic streptococci are predictably susceptible to penicillin and other beta lactams. Susceptibility testing not routinely performed. Performed at [facility], [place] [address], [place], [place] [address]    Report Status 12/28/2021 FINAL  Final  MRSA Next Gen by PCR, Nasal     Status: None   Collection Time: 12/26/21 12:22 PM   Specimen: Nasal Mucosa; Nasal Swab  Result Value Ref Range Status   MRSA by PCR Next Gen NOT DETECTED NOT DETECTED Final    Comment: (NOTE) The GeneXpert MRSA Assay (FDA approved for NASAL specimens only), is one component of a comprehensive MRSA colonization surveillance program. It is not intended to diagnose MRSA infection nor to guide or monitor treatment for MRSA infections. Test performance is not FDA approved in patients less than 70 years old. Performed at [facility], [address], [place],  [address]     RADIOLOGY STUDIES/RESULTS: ECHOCARDIOGRAM COMPLETE  Result Date: 12/30/2021    ECHOCARDIOGRAM REPORT   Patient Name:   Lydia Graham Date of Exam: 12/30/2021 Medical Rec #:  [id number]          Height:       67.0 in Accession #:    [id number]         Weight:  159.4 lb Date of Birth:  12-23-1935          BSA:          1.836 m Patient Age:    9 years           BP:           151/70 mmHg Patient Gender: F                  HR:           115 bpm. Exam Location:  [facility] Procedure: 2D Echo, Cardiac Doppler and Color Doppler Indications:    Atrial Fibrillation I48.91  History:        Patient has prior history of Echocardiogram examinations, most                 recent 03/22/2021. CHF, Carotid Disease; Risk                 Factors:Hypertension.  Sonographer:    Alvino Chapel RCS Referring Phys: [place]  1. Left ventricular ejection fraction, by estimation, is 45 to 50%. The left ventricle has mildly decreased function. The left  ventricle has no regional wall motion abnormalities. There is mild left ventricular hypertrophy. Left ventricular diastolic parameters are indeterminate.  2. Right ventricular systolic function is normal. The right ventricular size is normal. There is mildly elevated pulmonary artery systolic pressure.  3. Left atrial size was mildly dilated.  4. Right atrial size was mildly dilated.  5. The mitral valve is normal in structure. No evidence of mitral valve regurgitation. No evidence of mitral stenosis.  6. The aortic valve is normal in structure. Aortic valve regurgitation is not visualized. No aortic stenosis is present.  7. The inferior vena cava is normal in size with greater than 50% respiratory variability, suggesting right atrial pressure of 3 mmHg. Comparison(s): Prior images reviewed side by side. The left ventricular function is worsened. FINDINGS  Left Ventricle: Left ventricular ejection fraction, by estimation, is 45 to 50%. The left ventricle has mildly decreased function. The left ventricle has no regional wall motion abnormalities. The left ventricular internal cavity size was normal in size. There is mild left ventricular hypertrophy. Left ventricular diastolic parameters are indeterminate.  LV Wall Scoring: The inferior wall, mid inferoseptal segment, and basal inferoseptal segment are akinetic. Right Ventricle: The right ventricular size is normal. No increase in right ventricular wall thickness. Right ventricular systolic function is normal. There is mildly elevated pulmonary artery systolic pressure. The tricuspid regurgitant velocity is 3.06  m/s, and with an assumed right atrial pressure of 3 mmHg, the estimated right ventricular systolic pressure is 16.3 mmHg. Left Atrium: Left atrial size was mildly dilated. Right Atrium: Right atrial size was mildly dilated. Pericardium: There is no evidence of pericardial effusion. Mitral Valve: The mitral valve is normal in structure. No evidence of mitral  valve regurgitation. No evidence of mitral valve stenosis. Tricuspid Valve: The tricuspid valve is normal in structure. Tricuspid valve regurgitation is not demonstrated. No evidence of tricuspid stenosis. Aortic Valve: The aortic valve is normal in structure. Aortic valve regurgitation is not visualized. No aortic stenosis is present. Pulmonic Valve: The pulmonic valve was normal in structure. Pulmonic valve regurgitation is mild. No evidence of pulmonic stenosis. Aorta: The aortic root is normal in size and structure. Venous: The inferior vena cava is normal in size with greater than 50% respiratory variability, suggesting right atrial pressure of 3 mmHg. IAS/Shunts: No atrial  level shunt detected by color flow Doppler.  LEFT VENTRICLE PLAX 2D LVIDd:         4.10 cm LVIDs:         2.90 cm LV PW:         1.30 cm LV IVS:        1.30 cm LVOT diam:     1.80 cm LV SV:         39 LV SV Index:   21 LVOT Area:     2.54 cm  RIGHT VENTRICLE RV S prime:     14.80 cm/s TAPSE (M-mode): 1.5 cm LEFT ATRIUM             Index        RIGHT ATRIUM           Index LA diam:        3.90 cm 2.12 cm/m   RA Area:     20.20 cm LA Vol (A2C):   71.8 ml 39.10 ml/m  RA Volume:   59.40 ml  32.35 ml/m LA Vol (A4C):   72.5 ml 39.48 ml/m LA Biplane Vol: 72.6 ml 39.54 ml/m  AORTIC VALVE LVOT Vmax:   86.00 cm/s LVOT Vmean:  62.533 cm/s LVOT VTI:    0.152 m  AORTA Ao Root diam: 3.10 cm MITRAL VALVE                TRICUSPID VALVE MV Area (PHT): 4.73 cm     TR Peak grad:   37.5 mmHg MV Decel Time: 161 msec     TR Vmax:        306.00 cm/s MV E velocity: 111.00 cm/s                             SHUNTS                             Systemic VTI:  0.15 m                             Systemic Diam: 1.80 cm Candee Furbish MD Electronically signed by Candee Furbish MD Signature Date/Time: 12/30/2021/12:24:54 PM    Final      LOS: 6 days   Kathie Dike, MD  [facility]    To contact the attending provider between 7A-7P or the covering provider  during after hours 7P-7A, please log into the web site [email or web address] and access using universal [place] password for that web site. If you do not have the password, please call the hospital operator.  12/31/2021, 6:31 PM

## 2021-12-31 NOTE — Progress Notes (Signed)
Patient was sitting up in chair on room air with O2 sats noted to be 85-86%. Patient denied feeling short of breath or any respiratory distress. Placed O2 at 2L via nasal cannula on with sats going up to 93-95%. Dr Roderic Palau made aware.

## 2021-12-31 NOTE — Progress Notes (Addendum)
Progress Note  Patient Name: Lydia Graham Date of Encounter: 12/31/2021  [facility] Cardiologist: [name], MD    Subjective   Hypotensive and bradycardic yest am-amio stopped and given IV fluid bolus. Feeling better today. Arthritis acting up.  Inpatient Medications    Scheduled Meds:  apixaban  5 mg Oral BID   atropine  1 mg Intravenous Once   Chlorhexidine Gluconate Cloth  6 each Topical Daily   estradiol  0.5 mg Oral Daily   ezetimibe  10 mg Oral Daily   metoprolol succinate  100 mg Oral Daily   pantoprazole  40 mg Oral Daily   polyethylene glycol  17 g Oral Daily   silver sulfADIAZINE   Topical Daily   Continuous Infusions:  vancomycin 1,250 mg (12/30/21 1254)   PRN Meds: acetaminophen **OR** acetaminophen, acetaminophen, levalbuterol, magnesium hydroxide, metoprolol tartrate, ondansetron **OR** ondansetron (ZOFRAN) IV   Vital Signs    Vitals:   12/31/21 0600 12/31/21 0700 12/31/21 0716 12/31/21 0800  BP: (!) 130/39 (!) 159/65  (!) 170/54  Pulse: 62 69 68 72  Resp: (!) 23 14 (!) 26   Temp:   98.3 F (36.8 C)   TempSrc:   Oral   SpO2: 90% 95% 96% 93%  Weight:      Height:        Intake/Output Summary (Last 24 hours) at 12/31/2021 0853 Last data filed at 12/31/2021 0716 Gross per 24 hour  Intake 1621.42 ml  Output 2150 ml  Net -528.58 ml      12/30/2021    4:15 AM 12/29/2021    5:00 AM 12/28/2021    5:00 AM  Last 3 Weights  Weight (lbs) 159 lb 6.3 oz 171 lb 4.8 oz 171 lb 8.3 oz  Weight (kg) 72.3 kg 77.7 kg 77.8 kg      Telemetry    NSR with PVC's - Personally Reviewed  ECG       Physical Exam    GEN: No acute distress.   Neck: No JVD Cardiac: RRR, no murmurs, rubs, or gallops.  Respiratory: Clear to auscultation bilaterally. GI: Soft, nontender, non-distended  MS: No edema; No deformity. Neuro:  Nonfocal  Psych: Normal affect   Labs    High Sensitivity Troponin:   Recent Labs  Lab 12/27/21 0058  TROPONINIHS 12      Chemistry Recent Labs  Lab 12/24/21 2158 12/25/21 0711 12/26/21 0452 12/27/21 0058 12/27/21 0939 12/29/21 0342 12/30/21 0339 12/31/21 0333  NA 125* 130*   < > 132*   < > 134* 134* 136  K 4.1 3.9   < > 3.6   < > 4.0 3.7 4.0  CL 96* 102   < > 105   < > 103 104 107  CO2 18* 20*   < > 21*   < > '22 23 24  '$ GLUCOSE 143* 114*   < > 126*   < > 188* 100* 89  BUN 26* 21   < > 11   < > '14 16 14  '$ CREATININE 1.68* 1.30*   < > 0.97   < > 1.05* 1.09* 0.97  CALCIUM 8.9 8.4*   < > 7.9*   < > 8.2* 8.5* 8.8*  MG  --   --   --  1.8  --  1.9  --  1.9  PROT 6.6 5.7*  --  5.6*  --   --   --   --   ALBUMIN 3.5 2.9*  --  2.5*  --   --   --   --   AST 22 20  --  17  --   --   --   --   ALT 14 12  --  13  --   --   --   --   ALKPHOS 44 44  --  53  --   --   --   --   BILITOT 0.9 0.7  --  0.3  --   --   --   --   GFRNONAA 30* 41*   < > 58*   < > 52* 50* 58*  ANIONGAP 11 8   < > 6   < > '9 7 5   '$ < > = values in this interval not displayed.    Lipids No results for input(s): "CHOL", "TRIG", "HDL", "LABVLDL", "LDLCALC", "CHOLHDL" in the last 168 hours.  Hematology Recent Labs  Lab 12/28/21 0353 12/29/21 0342 12/31/21 0333  WBC 10.5 10.4 10.0  RBC 3.49* 3.53* 3.49*  HGB 10.7* 10.7* 10.8*  HCT 31.7* 32.5* 32.0*  MCV 90.8 92.1 91.7  MCH 30.7 30.3 30.9  MCHC 33.8 32.9 33.8  RDW 14.2 14.3 14.3  PLT 271 317 496*   Thyroid  Recent Labs  Lab 12/27/21 0059  TSH 3.790    BNP Recent Labs  Lab 12/28/21 0353 12/30/21 0339 12/31/21 0334  BNP 808.0* 502.0* 415.0*    DDimer No results for input(s): "DDIMER" in the last 168 hours.   Radiology    ECHOCARDIOGRAM COMPLETE  Result Date: 12/30/2021    ECHOCARDIOGRAM REPORT   Patient Name:   Lydia Graham Date of Exam: 12/30/2021 Medical Rec #:  [id number]          Height:       67.0 in Accession #:    [id number]         Weight:       159.4 lb Date of Birth:  03-04-1936          BSA:          1.836 m Patient Age:    86 years           BP:            151/70 mmHg Patient Gender: F                  HR:           115 bpm. Exam Location:  [facility] Procedure: 2D Echo, Cardiac Doppler and Color Doppler Indications:    Atrial Fibrillation I48.91  History:        Patient has prior history of Echocardiogram examinations, most                 recent 03/22/2021. CHF, Carotid Disease; Risk                 Factors:Hypertension.  Sonographer:    Alvino Chapel RCS Referring Phys: Tiger  1. Left ventricular ejection fraction, by estimation, is 45 to 50%. The left ventricle has mildly decreased function. The left ventricle has no regional wall motion abnormalities. There is mild left ventricular hypertrophy. Left ventricular diastolic parameters are indeterminate.  2. Right ventricular systolic function is normal. The right ventricular size is normal. There is mildly elevated pulmonary artery systolic pressure.  3. Left atrial size was mildly dilated.  4. Right atrial size was mildly dilated.  5. The mitral valve is normal in  structure. No evidence of mitral valve regurgitation. No evidence of mitral stenosis.  6. The aortic valve is normal in structure. Aortic valve regurgitation is not visualized. No aortic stenosis is present.  7. The inferior vena cava is normal in size with greater than 50% respiratory variability, suggesting right atrial pressure of 3 mmHg. Comparison(s): Prior images reviewed side by side. The left ventricular function is worsened. FINDINGS  Left Ventricle: Left ventricular ejection fraction, by estimation, is 45 to 50%. The left ventricle has mildly decreased function. The left ventricle has no regional wall motion abnormalities. The left ventricular internal cavity size was normal in size. There is mild left ventricular hypertrophy. Left ventricular diastolic parameters are indeterminate.  LV Wall Scoring: The inferior wall, mid inferoseptal segment, and basal inferoseptal segment are akinetic. Right Ventricle: The right  ventricular size is normal. No increase in right ventricular wall thickness. Right ventricular systolic function is normal. There is mildly elevated pulmonary artery systolic pressure. The tricuspid regurgitant velocity is 3.06  m/s, and with an assumed right atrial pressure of 3 mmHg, the estimated right ventricular systolic pressure is 32.2 mmHg. Left Atrium: Left atrial size was mildly dilated. Right Atrium: Right atrial size was mildly dilated. Pericardium: There is no evidence of pericardial effusion. Mitral Valve: The mitral valve is normal in structure. No evidence of mitral valve regurgitation. No evidence of mitral valve stenosis. Tricuspid Valve: The tricuspid valve is normal in structure. Tricuspid valve regurgitation is not demonstrated. No evidence of tricuspid stenosis. Aortic Valve: The aortic valve is normal in structure. Aortic valve regurgitation is not visualized. No aortic stenosis is present. Pulmonic Valve: The pulmonic valve was normal in structure. Pulmonic valve regurgitation is mild. No evidence of pulmonic stenosis. Aorta: The aortic root is normal in size and structure. Venous: The inferior vena cava is normal in size with greater than 50% respiratory variability, suggesting right atrial pressure of 3 mmHg. IAS/Shunts: No atrial level shunt detected by color flow Doppler.  LEFT VENTRICLE PLAX 2D LVIDd:         4.10 cm LVIDs:         2.90 cm LV PW:         1.30 cm LV IVS:        1.30 cm LVOT diam:     1.80 cm LV SV:         39 LV SV Index:   21 LVOT Area:     2.54 cm  RIGHT VENTRICLE RV S prime:     14.80 cm/s TAPSE (M-mode): 1.5 cm LEFT ATRIUM             Index        RIGHT ATRIUM           Index LA diam:        3.90 cm 2.12 cm/m   RA Area:     20.20 cm LA Vol (A2C):   71.8 ml 39.10 ml/m  RA Volume:   59.40 ml  32.35 ml/m LA Vol (A4C):   72.5 ml 39.48 ml/m LA Biplane Vol: 72.6 ml 39.54 ml/m  AORTIC VALVE LVOT Vmax:   86.00 cm/s LVOT Vmean:  62.533 cm/s LVOT VTI:    0.152 m  AORTA  Ao Root diam: 3.10 cm MITRAL VALVE                TRICUSPID VALVE MV Area (PHT): 4.73 cm     TR Peak grad:   37.5 mmHg MV Decel Time: 161 msec  TR Vmax:        306.00 cm/s MV E velocity: 111.00 cm/s                             SHUNTS                             Systemic VTI:  0.15 m                             Systemic Diam: 1.80 cm Candee Furbish MD Electronically signed by Candee Furbish MD Signature Date/Time: 12/30/2021/12:24:54 PM    Final    CT CHEST WO CONTRAST  Result Date: 12/29/2021 CLINICAL DATA:  Pneumonia EXAM: CT CHEST WITHOUT CONTRAST TECHNIQUE: Multidetector CT imaging of the chest was performed following the standard protocol without IV contrast. RADIATION DOSE REDUCTION: This exam was performed according to the departmental dose-optimization program which includes automated exposure control, adjustment of the mA and/or kV according to patient size and/or use of iterative reconstruction technique. COMPARISON:  None Available. FINDINGS: Cardiovascular: Aortic atherosclerosis. Cardiomegaly. No pericardial effusion. Mediastinum/Nodes: No enlarged mediastinal, hilar, or axillary lymph nodes. Small hiatal hernia. Thyroid gland, trachea, and esophagus demonstrate no significant findings. Lungs/Pleura: Moderate bilateral pleural effusions with associated atelectasis or consolidation. Diffuse interlobular septal thickening. Scattered heterogeneous and ground-glass airspace opacities throughout the lungs. Upper Abdomen: No acute abnormality. Musculoskeletal: No chest wall abnormality. No suspicious osseous lesions identified. IMPRESSION: 1. Moderate bilateral pleural effusions with associated atelectasis or consolidation. 2. Diffuse interlobular septal thickening consistent with pulmonary edema. 3. Scattered heterogeneous and ground-glass airspace opacities throughout the lungs, consistent with nonspecific infection or inflammation. 4. Cardiomegaly. Aortic Atherosclerosis (ICD10-I70.0). Electronically Signed    By: Delanna Ahmadi M.D.   On: 12/29/2021 17:28   DG CHEST PORT 1 VIEW  Result Date: 12/29/2021 CLINICAL DATA:  Shortness of breath. EXAM: PORTABLE CHEST 1 VIEW COMPARISON:  12/27/2021 FINDINGS: Similar retrocardiac left base collapse/consolidation with small left pleural effusion. Interval increase in streaky opacity at the right base, likely atelectasis. The cardio pericardial silhouette is enlarged. Interstitial markings are diffusely coarsened with chronic features. Telemetry leads overlie the chest. IMPRESSION: 1. Similar retrocardiac left base collapse/consolidation with small left pleural effusion. 2. Interval increase in streaky opacity at the right base, likely atelectasis. Electronically Signed   By: Misty Stanley M.D.   On: 12/29/2021 13:25    Cardiac Studies    Echocardiogram: 02/2021 IMPRESSIONS     1. Left ventricular ejection fraction, by estimation, is 65 to 70%. The  left ventricle has normal function. The left ventricle has no regional  wall motion abnormalities. Left ventricular diastolic parameters are  indeterminate.   2. Right ventricular systolic function is normal. The right ventricular  size is normal. There is mildly elevated pulmonary artery systolic  pressure.   3. Left atrial size was mildly dilated.   4. The mitral valve is normal in structure. Trivial mitral valve  regurgitation. No evidence of mitral stenosis.   5. The aortic valve is tricuspid. Aortic valve regurgitation is not  visualized. No aortic stenosis is present.   6. The inferior vena cava is normal in size with greater than 50%  respiratory variability, suggesting right atrial pressure of 3 mmHg.   Comparison(s): Previous Echo was technically challenging due to A-fib. LV  EF was '@50'$ %, with moderate MR and TR, and biatrial  enlargement.    Carotid Dopplers: 05/2021 Summary:  Right Carotid: Velocities in the right ICA are consistent with a 1-39%  stenosis.   Left Carotid: Velocities in the left  ICA are consistent with a 1-39%  stenosis.   Vertebrals:  Bilateral vertebral arteries demonstrate antegrade flow.  Subclavians: Normal flow hemodynamics were seen in bilateral subclavian               arteries.   Patient Profile     86 y.o. female  with persistent atrial fibrillation on both amiodarone as well as Eliquis with carotid artery stenosis status post left carotid endarterectomy hypertension hyperlipidemia who presented with fever acute mental status changes and was septic from urosepsis as well as right forearm cellulitis.  We are seeing her because of atrial fibrillation with rapid ventricular response.   Assessment & Plan    Atrial fibrillation persistent with rapid ventricular response - She had a prior history of persistent atrial fibrillation and underwent cardioversion in 2019.  This was ultimately successful after being started on amiodarone.  She had been doing quite well up until now with her underlying illness. - was on amiodarone IV again  and increased  metoprolol succinate from 75 mg up to 100 mg a  Patient became hypotensive and bradycardic this am and amiodarone stopped and given NS bolus. Would keep on amiodarone 200 bid while herer in hospital  Get her through the acute setting    GOal is to taper   She had been on 100 qod at home   New CM EF 45-50% on echo 12/30/21 in the setting of urosepsis, currently on metoprolol for rate control. Consider losartan for BP and EF. Recheck echo in 3 months.   Carotid artery stenosis - Left CEA followed by vascular as an outpatient.  Minimal stenosis currently.  She is not on aspirin or antiplatelet because of anticoagulation needs with Eliquis.  She also has not been tolerant of statins. -  Zetia 10 mg a day started.  Consider PCSK9 inhibitor as outpatient.   Hypertension - up today but with hypotensive episode hold off on amlodipine at this time   Urosepsis - Has received vancomycin and cefepime.   Right forearm  cellulitis - Vancomycin and cefepime per primary team.  States that this was a result of her 63-year-old family member American girl doll falling on her.   Hyponatremia/hypokalemia - resolved         For questions or updates, please contact [place] Please consult [email or web address] for contact info under        Signed, Ermalinda Barrios, PA-C  12/31/2021, 8:53 AM    Patient seen and examined   I agree with findngs  as noted by Gerrianne Scale above     Pt is feeling better  Neck  JVP is normal Lungs are CTA Cardiac RRR  No S3   Abd is benign   Ext are without edema  I would recomm keeping po amio at 200 bid while here in the hospital, given what has happened so far   Once she recovers from urinary infection fully, goal would be to rapidly taper to outpt dose of amio  Dorris Carnes MD

## 2021-12-31 NOTE — Care Management Important Message (Signed)
Important Message  Patient Details  Name: Lydia Graham MRN: [id number] Date of Birth: 12/25/1935   Medicare Important Message Given:  Yes     Tommy Medal 12/31/2021, 11:10 AM

## 2021-12-31 NOTE — Evaluation (Signed)
Physical Therapy Evaluation Patient Details Name: Lydia Graham MRN: [id number] DOB: Oct 03, 1935 Today's Date: 12/31/2021  History of Present Illness  Lydia Graham is a 86 y.o. female with medical history significant of hypertension, A-fib on Eliquis, GERD who presents to the emergency department due to altered mental status.  Daughter at bedside provided most of the history.  Per report, patient complained of 1 day onset of urinary frequency and urgency whereby she went to the bathroom several times in the course of the night.  During the day, she went to her PCP's office and she was diagnosed to have UTI, Cipro was prescribed and she took 1 on getting home.  However, later in the day, she was noted to have increased confusion whereby she had difficulty in being able to get her thoughts together, temperature was checked and it was 102.79F, daughter notified patient's sister who was a Designer, jewellery and she asked daughter to take patient to the ER.  She complained of superficial laceration of her right forearm which occurred when she was accidentally hit in the arm.  She denies chest pain, shortness of breath, vomiting, diarrhea, abdominal pain   Clinical Impression  Patient presents supine in bed on 2 LPM with family present. Patient consents to PT evaluation. Placed on room air throughout session to simulate home environment with vitals monitored. Patient is modified independent with bed mobility requiring momentum to reach sitting position. Good trunk control with being able to scoot to EOB. Patient is min assist with transfers and use of SPC. Attempted transfers without [facility] but patient needs HHA due to unsteadiness. Improved transfers with [facility] demonstrating fair coordination and balance. Mild labored movement observed. Patient was able to ambulate 50 feet in hallway with RW. Attempted SPC but RW utilized due to decreased balancing ability and unsteadiness with dynamic balance and use of SPC.  Mild gait deviations observed with patient drifting R/L at times. Slow gait speed with gait pattern impacted by fatigue. Vitals assessed during gait with SpO2 dropping to 84% but not matching patient presentation. Averaged SpO2 during gait was 90% and in therapeutic range. Patient tolerated sitting up in chair after therapy with nursing staff notified of mobility status. Patient will benefit from continued skilled physical therapy in hospital and recommended venue below to increase strength, balance, endurance for safe ADLs and gait.      Recommendations for follow up therapy are one component of a multi-disciplinary discharge planning process, led by the attending physician.  Recommendations may be updated based on patient status, additional functional criteria and insurance authorization.  Follow Up Recommendations Home health PT      Assistance Recommended at Discharge Set up Supervision/Assistance  Patient can return home with the following  A little help with walking and/or transfers;Help with stairs or ramp for entrance    Equipment Recommendations None recommended by PT  Recommendations for Other Services       Functional Status Assessment Patient has had a recent decline in their functional status and demonstrates the ability to make significant improvements in function in a reasonable and predictable amount of time.     Precautions / Restrictions Precautions Precautions: Fall Restrictions Weight Bearing Restrictions: No      Mobility  Bed Mobility Overal bed mobility: Modified Independent             General bed mobility comments: Modified independent with bed mobility. Requires momentum to reach sitting position. Good trunk control    Transfers Overall transfer level: Needs  assistance Equipment used: Straight cane Transfers: Sit to/from Stand, Bed to chair/wheelchair/BSC Sit to Stand: Min assist   Step pivot transfers: Min assist       General transfer  comment: Min assist with transfers and SPC. Attempted without SPC with patient needing HHA due to being unsteady. Improved transfers with [facility] demonstrating fair coordination and balance. Mild labored movement.    Ambulation/Gait Ambulation/Gait assistance: Min guard Gait Distance (Feet): 50 Feet Assistive device: Rolling walker (2 wheels) Gait Pattern/deviations: Step-to pattern, Decreased step length - left, Decreased step length - right, Decreased stride length, Drifts right/left Gait velocity: decreased     General Gait Details: Able to ambulate 50 feet with min guard and use of RW. Attempted SPC but patient requested RW due to being unsteady. Mild gait deviations observed with patient drifting R/L at times. Slow gait pattern being limited by fatigue. Vitals assessed  Stairs            Wheelchair Mobility    Modified Rankin (Stroke Patients Only)       Balance Overall balance assessment: Mild deficits observed, not formally tested                                           Pertinent Vitals/Pain Pain Assessment Pain Assessment: No/denies pain    Home Living Family/patient expects to be discharged to:: Private residence Living Arrangements: Alone Available Help at Discharge: Family;Available PRN/intermittently;Available 24 hours/day Type of Home: House Home Access: Stairs to enter Entrance Stairs-Rails: Left Entrance Stairs-Number of Steps: 2   Home Layout: One level Home Equipment: Other (comment);Cane - single Barista (2 wheels) (walking stick)      Prior Function Prior Level of Function : Independent/Modified Independent             Mobility Comments: Patient reports being able to ambulate independently at home and in the community. Used walking stick intermittently. Currently driving ADLs Comments: Patient reports being independent with ADL's and functional tasks.     Hand Dominance   Dominant Hand: Right     Extremity/Trunk Assessment   Upper Extremity Assessment Upper Extremity Assessment: Overall WFL for tasks assessed    Lower Extremity Assessment Lower Extremity Assessment: Generalized weakness    Cervical / Trunk Assessment Cervical / Trunk Assessment: Normal  Communication   Communication: No difficulties  Cognition Arousal/Alertness: Awake/alert Behavior During Therapy: WFL for tasks assessed/performed Overall Cognitive Status: Within Functional Limits for tasks assessed                                          General Comments      Exercises     Assessment/Plan    PT Assessment Patient needs continued PT services  PT Problem List Decreased strength;Decreased activity tolerance;Decreased balance;Decreased mobility;Decreased coordination       PT Treatment Interventions DME instruction;Gait training;Functional mobility training;Therapeutic activities;Therapeutic exercise;Balance training    PT Goals (Current goals can be found in the Care Plan section)  Acute Rehab PT Goals Patient Stated Goal: return home PT Goal Formulation: With patient Time For Goal Achievement: 01/07/22 Potential to Achieve Goals: Good    Frequency Min 3X/week     Co-evaluation               AM-PAC PT "6 Clicks" Mobility  Outcome Measure Help needed turning from your back to your side while in a flat bed without using bedrails?: None Help needed moving from lying on your back to sitting on the side of a flat bed without using bedrails?: A Little Help needed moving to and from a bed to a chair (including a wheelchair)?: A Little Help needed standing up from a chair using your arms (e.g., wheelchair or bedside chair)?: A Little Help needed to walk in hospital room?: A Little Help needed climbing 3-5 steps with a railing? : A Lot 6 Click Score: 18    End of Session   Activity Tolerance: Patient tolerated treatment well;No increased pain;Patient limited by  fatigue Patient left: in chair;with call bell/phone within reach;with family/visitor present Nurse Communication: Mobility status PT Visit Diagnosis: Unsteadiness on feet (R26.81);Other abnormalities of gait and mobility (R26.89);Muscle weakness (generalized) (M62.81)    Time: 9021-1155 PT Time Calculation (min) (ACUTE ONLY): 25 min   Charges:   PT Evaluation $PT Eval Moderate Complexity: 1 Mod PT Treatments $Therapeutic Activity: 23-37 mins        2:21 PM, 12/31/21 Lestine Box, S/PT

## 2022-01-01 DIAGNOSIS — N390 Urinary tract infection, site not specified: Secondary | ICD-10-CM | POA: Diagnosis not present

## 2022-01-01 DIAGNOSIS — G9341 Metabolic encephalopathy: Secondary | ICD-10-CM | POA: Diagnosis not present

## 2022-01-01 DIAGNOSIS — I482 Chronic atrial fibrillation, unspecified: Secondary | ICD-10-CM | POA: Diagnosis not present

## 2022-01-01 DIAGNOSIS — A419 Sepsis, unspecified organism: Secondary | ICD-10-CM | POA: Diagnosis not present

## 2022-01-01 DIAGNOSIS — N179 Acute kidney failure, unspecified: Secondary | ICD-10-CM | POA: Diagnosis not present

## 2022-01-01 LAB — BASIC METABOLIC PANEL
Anion gap: 7 (ref 5–15)
BUN: 18 mg/dL (ref 8–23)
CO2: 24 mmol/L (ref 22–32)
Calcium: 8.8 mg/dL — ABNORMAL LOW (ref 8.9–10.3)
Chloride: 101 mmol/L (ref 98–111)
Creatinine, Ser: 1.01 mg/dL — ABNORMAL HIGH (ref 0.44–1.00)
GFR, Estimated: 55 mL/min — ABNORMAL LOW (ref >60)
Glucose, Bld: 99 mg/dL (ref 70–99)
Potassium: 4.3 mmol/L (ref 3.5–5.1)
Sodium: 132 mmol/L — ABNORMAL LOW (ref 135–145)

## 2022-01-01 MED ORDER — FUROSEMIDE 10 MG/ML IJ SOLN
20.0000 mg | Freq: Once | INTRAMUSCULAR | Status: AC
  Administered 2022-01-01: 20 mg via INTRAVENOUS
  Filled 2022-01-01: qty 2

## 2022-01-01 MED ORDER — LOSARTAN POTASSIUM 50 MG PO TABS
50.0000 mg | ORAL_TABLET | Freq: Every day | ORAL | Status: DC
  Administered 2022-01-01 – 2022-01-02 (×2): 50 mg via ORAL
  Filled 2022-01-01 (×2): qty 1

## 2022-01-01 MED ORDER — CEFADROXIL 500 MG PO CAPS
500.0000 mg | ORAL_CAPSULE | Freq: Two times a day (BID) | ORAL | Status: DC
  Administered 2022-01-01 – 2022-01-02 (×3): 500 mg via ORAL
  Filled 2022-01-01 (×7): qty 1

## 2022-01-01 NOTE — Progress Notes (Signed)
Progress Note  Patient Name: Lydia Graham Date of Encounter: 01/01/2022  [facility] Cardiologist: [name], MD    Subjective   Pt denies dizziness  No SOB  No CP   Walked in hallway earlier    Inpatient Medications    Scheduled Meds:  amiodarone  200 mg Oral BID   apixaban  5 mg Oral BID   Chlorhexidine Gluconate Cloth  6 each Topical Daily   estradiol  0.5 mg Oral Daily   ezetimibe  10 mg Oral Daily   metoprolol succinate  100 mg Oral Daily   pantoprazole  40 mg Oral Daily   polyethylene glycol  17 g Oral Daily   silver sulfADIAZINE   Topical Daily   Continuous Infusions:  vancomycin 1,250 mg (12/31/21 1210)   PRN Meds: acetaminophen **OR** acetaminophen, acetaminophen, HYDROcodone-acetaminophen, levalbuterol, magnesium hydroxide, metoprolol tartrate, ondansetron **OR** ondansetron (ZOFRAN) IV   Vital Signs    Vitals:   12/31/21 1624 12/31/21 2217 01/01/22 0113 01/01/22 0510  BP: (!) 167/79 (!) 151/64 (!) 137/52 (!) 155/66  Pulse: 74 71 77 67  Resp: '20 20 20 20  '$ Temp: 98.6 F (37 C) 98.7 F (37.1 C) 98.7 F (37.1 C) 98.8 F (37.1 C)  TempSrc:      SpO2: 94% 93% 93% 94%  Weight:      Height:        Intake/Output Summary (Last 24 hours) at 01/01/2022 1034 Last data filed at 12/31/2021 1625 Gross per 24 hour  Intake --  Output 1825 ml  Net -1825 ml      12/30/2021    4:15 AM 12/29/2021    5:00 AM 12/28/2021    5:00 AM  Last 3 Weights  Weight (lbs) 159 lb 6.3 oz 171 lb 4.8 oz 171 lb 8.3 oz  Weight (kg) 72.3 kg 77.7 kg 77.8 kg      Telemetry    NSR  - Personally Reviewed  ECG       Physical Exam    GEN: No acute distress.   Neck: No JVD Cardiac: RRR, no murmurs Respiratory: Clear to auscultation bilaterally. GI: Soft, nontender, non-distended  MS: No edema; No deformity. Skin  R arm skin is red, edematous   Bandage covering  Neuro:  Nonfocal  Psych: Normal affect   Labs    High Sensitivity Troponin:   Recent Labs  Lab  12/27/21 0058  TROPONINIHS 12     Chemistry Recent Labs  Lab 12/27/21 0058 12/27/21 0939 12/29/21 0342 12/30/21 0339 12/31/21 0333  NA 132*   < > 134* 134* 136  K 3.6   < > 4.0 3.7 4.0  CL 105   < > 103 104 107  CO2 21*   < > '22 23 24  '$ GLUCOSE 126*   < > 188* 100* 89  BUN 11   < > '14 16 14  '$ CREATININE 0.97   < > 1.05* 1.09* 0.97  CALCIUM 7.9*   < > 8.2* 8.5* 8.8*  MG 1.8  --  1.9  --  1.9  PROT 5.6*  --   --   --   --   ALBUMIN 2.5*  --   --   --   --   AST 17  --   --   --   --   ALT 13  --   --   --   --   ALKPHOS 53  --   --   --   --  BILITOT 0.3  --   --   --   --   GFRNONAA 58*   < > 52* 50* 58*  ANIONGAP 6   < > '9 7 5   '$ < > = values in this interval not displayed.    Lipids No results for input(s): "CHOL", "TRIG", "HDL", "LABVLDL", "LDLCALC", "CHOLHDL" in the last 168 hours.  Hematology Recent Labs  Lab 12/28/21 0353 12/29/21 0342 12/31/21 0333  WBC 10.5 10.4 10.0  RBC 3.49* 3.53* 3.49*  HGB 10.7* 10.7* 10.8*  HCT 31.7* 32.5* 32.0*  MCV 90.8 92.1 91.7  MCH 30.7 30.3 30.9  MCHC 33.8 32.9 33.8  RDW 14.2 14.3 14.3  PLT 271 317 496*   Thyroid  Recent Labs  Lab 12/27/21 0059  TSH 3.790    BNP Recent Labs  Lab 12/28/21 0353 12/30/21 0339 12/31/21 0334  BNP 808.0* 502.0* 415.0*    DDimer No results for input(s): "DDIMER" in the last 168 hours.   Radiology    ECHOCARDIOGRAM COMPLETE  Result Date: 12/30/2021    ECHOCARDIOGRAM REPORT   Patient Name:   Lydia Graham Date of Exam: 12/30/2021 Medical Rec #:  [id number]          Height:       67.0 in Accession #:    [id number]         Weight:       159.4 lb Date of Birth:  03-29-1936          BSA:          1.836 m Patient Age:    86 years           BP:           151/70 mmHg Patient Gender: F                  HR:           115 bpm. Exam Location:  [facility] Procedure: 2D Echo, Cardiac Doppler and Color Doppler Indications:    Atrial Fibrillation I48.91  History:        Patient has prior history of  Echocardiogram examinations, most                 recent 03/22/2021. CHF, Carotid Disease; Risk                 Factors:Hypertension.  Sonographer:    Alvino Chapel RCS Referring Phys: [place]  1. Left ventricular ejection fraction, by estimation, is 45 to 50%. The left ventricle has mildly decreased function. The left ventricle has no regional wall motion abnormalities. There is mild left ventricular hypertrophy. Left ventricular diastolic parameters are indeterminate.  2. Right ventricular systolic function is normal. The right ventricular size is normal. There is mildly elevated pulmonary artery systolic pressure.  3. Left atrial size was mildly dilated.  4. Right atrial size was mildly dilated.  5. The mitral valve is normal in structure. No evidence of mitral valve regurgitation. No evidence of mitral stenosis.  6. The aortic valve is normal in structure. Aortic valve regurgitation is not visualized. No aortic stenosis is present.  7. The inferior vena cava is normal in size with greater than 50% respiratory variability, suggesting right atrial pressure of 3 mmHg. Comparison(s): Prior images reviewed side by side. The left ventricular function is worsened. FINDINGS  Left Ventricle: Left ventricular ejection fraction, by estimation, is 45 to 50%. The left ventricle has mildly decreased function. The left  ventricle has no regional wall motion abnormalities. The left ventricular internal cavity size was normal in size. There is mild left ventricular hypertrophy. Left ventricular diastolic parameters are indeterminate.  LV Wall Scoring: The inferior wall, mid inferoseptal segment, and basal inferoseptal segment are akinetic. Right Ventricle: The right ventricular size is normal. No increase in right ventricular wall thickness. Right ventricular systolic function is normal. There is mildly elevated pulmonary artery systolic pressure. The tricuspid regurgitant velocity is 3.06  m/s, and with an  assumed right atrial pressure of 3 mmHg, the estimated right ventricular systolic pressure is 01.6 mmHg. Left Atrium: Left atrial size was mildly dilated. Right Atrium: Right atrial size was mildly dilated. Pericardium: There is no evidence of pericardial effusion. Mitral Valve: The mitral valve is normal in structure. No evidence of mitral valve regurgitation. No evidence of mitral valve stenosis. Tricuspid Valve: The tricuspid valve is normal in structure. Tricuspid valve regurgitation is not demonstrated. No evidence of tricuspid stenosis. Aortic Valve: The aortic valve is normal in structure. Aortic valve regurgitation is not visualized. No aortic stenosis is present. Pulmonic Valve: The pulmonic valve was normal in structure. Pulmonic valve regurgitation is mild. No evidence of pulmonic stenosis. Aorta: The aortic root is normal in size and structure. Venous: The inferior vena cava is normal in size with greater than 50% respiratory variability, suggesting right atrial pressure of 3 mmHg. IAS/Shunts: No atrial level shunt detected by color flow Doppler.  LEFT VENTRICLE PLAX 2D LVIDd:         4.10 cm LVIDs:         2.90 cm LV PW:         1.30 cm LV IVS:        1.30 cm LVOT diam:     1.80 cm LV SV:         39 LV SV Index:   21 LVOT Area:     2.54 cm  RIGHT VENTRICLE RV S prime:     14.80 cm/s TAPSE (M-mode): 1.5 cm LEFT ATRIUM             Index        RIGHT ATRIUM           Index LA diam:        3.90 cm 2.12 cm/m   RA Area:     20.20 cm LA Vol (A2C):   71.8 ml 39.10 ml/m  RA Volume:   59.40 ml  32.35 ml/m LA Vol (A4C):   72.5 ml 39.48 ml/m LA Biplane Vol: 72.6 ml 39.54 ml/m  AORTIC VALVE LVOT Vmax:   86.00 cm/s LVOT Vmean:  62.533 cm/s LVOT VTI:    0.152 m  AORTA Ao Root diam: 3.10 cm MITRAL VALVE                TRICUSPID VALVE MV Area (PHT): 4.73 cm     TR Peak grad:   37.5 mmHg MV Decel Time: 161 msec     TR Vmax:        306.00 cm/s MV E velocity: 111.00 cm/s                             SHUNTS                              Systemic VTI:  0.15 m  Systemic Diam: 1.80 cm Candee Furbish MD Electronically signed by Candee Furbish MD Signature Date/Time: 12/30/2021/12:24:54 PM    Final     Cardiac Studies    Echocardiogram: 02/2021 IMPRESSIONS     1. Left ventricular ejection fraction, by estimation, is 65 to 70%. The  left ventricle has normal function. The left ventricle has no regional  wall motion abnormalities. Left ventricular diastolic parameters are  indeterminate.   2. Right ventricular systolic function is normal. The right ventricular  size is normal. There is mildly elevated pulmonary artery systolic  pressure.   3. Left atrial size was mildly dilated.   4. The mitral valve is normal in structure. Trivial mitral valve  regurgitation. No evidence of mitral stenosis.   5. The aortic valve is tricuspid. Aortic valve regurgitation is not  visualized. No aortic stenosis is present.   6. The inferior vena cava is normal in size with greater than 50%  respiratory variability, suggesting right atrial pressure of 3 mmHg.   Comparison(s): Previous Echo was technically challenging due to A-fib. LV  EF was '@50'$ %, with moderate MR and TR, and biatrial enlargement.    Carotid Dopplers: 05/2021 Summary:  Right Carotid: Velocities in the right ICA are consistent with a 1-39%  stenosis.   Left Carotid: Velocities in the left ICA are consistent with a 1-39%  stenosis.   Vertebrals:  Bilateral vertebral arteries demonstrate antegrade flow.  Subclavians: Normal flow hemodynamics were seen in bilateral subclavian               arteries.   Patient Profile     86 y.o. female  with persistent atrial fibrillation on both amiodarone as well as Eliquis with carotid artery stenosis status post left carotid endarterectomy hypertension hyperlipidemia who presented with fever acute mental status changes and was septic from urosepsis as well as right forearm cellulitis.  We are seeing  her because of atrial fibrillation with rapid ventricular response.   Assessment & Plan    Atrial fibrillation persistent with rapid ventricular response - She had a prior history of persistent atrial fibrillation and underwent cardioversion in 2019.  This was ultimately successful after being started on amiodarone.  She had been doing quite well up until now with her underlying illness. This admit developed afib with RVR   Rx with IV amio and Toprol XL  Dose increased to 100 daily  Pt transiently became hypotensive Now has maintained SR   Currently on 200 bid here in hospital while infection treated ON D/C could decrease to 100 daily   Can follow as outpt with EP, taper further as outpt  2.  HFmrEF   New   ? Related to infection   LVEF 45 to 50%   Follow in a few months   3   HTN  BP has been high   Meds were held earlier in admit  [facility] is on a higher dose of Toprol than she had been HR is OK Will add back losartan 50    Still has amlodipine 5 mg that she was on previously   Have not restarted    Follow BP  CV dz   Pt s/p LCEA    On Zetia   Consider PCSK9 I as outpt    ID  Treated for urine and skin infection       OK for d/c from cardiac standpoint   Will make sure she has follow up in clinc      For questions  or updates, please contact [place] Please consult [email or web address] for contact info under        Signed, Dorris Carnes, MD  01/01/2022, 10:34 AM

## 2022-01-01 NOTE — Progress Notes (Signed)
PROGRESS NOTE        PATIENT DETAILS Name: Lydia Graham Age: 86 y.o. Sex: female Date of Birth: 09-14-1935 Admit Date: 12/24/2021 Admitting Physician Bernadette Hoit, DO LAG:TXMI, Costella Hatcher, MD  Brief Summary: Patient is a 86 y.o.  female with history of persistent atrial fibrillation, GERD who was recently diagnosed with UTI-and started on Cipro (took for 1 day) prior to admission-she was brought to the hospital for fever, altered mental status-and subsequently admitted to the hospitalist service.  Source of infection felt to be right arm wound and cellulitis.  She was started on IV antibiotics and IV fluids. She developed rapid atrial fibrillation. Cardiology consulted.  Treated with intravenous amiodarone.  As infection improved, heart rate also improved.  She converted back to sinus rhythm.  She did have evidence of volume overload and developed significant hypoxia, likely precipitated by IV hydration during sepsis.  She is now being diuresed.  Oxygen is being weaned off.   Significant events: 7/3>> diagnosed with UTI as outpatient-started on Cipro-but developed confusion-admit to [facility] 7/5>>transfer to SDU for rapid A fib. Cardiology consulted  Significant studies: 7/3>> no obvious PNA  Significant microbiology data: 7/3>> blood culture: No growth 7/4>> urine culture: No growth 7/5>>wound culture growing group A strep  Procedures: echo  Consults: Cardiology ortho  Subjective: Had some pain in her forearm today, felt as though dressing was too tight.  This improved after dressing was adjusted.  Feels that breathing is improving.  Objective: Vitals: Blood pressure (!) 149/61, pulse 66, temperature 98.7 F (37.1 C), temperature source Oral, resp. rate 20, height '5\' 7"'$  (1.702 m), weight 72.3 kg, SpO2 92 %.   Exam: Gen Exam:Alert awake-not in any distress HEENT:atraumatic, normocephalic Chest: coarse breath sounds at bases with fair air  movement CVS:S1S2 irregular Abdomen:soft non tender, non distended.  Mild bilateral CVA tenderness. Extremities:lower extremity edema improving Neurology: Non focal Skin: Erythema and wound on right arm as pictured below          Pertinent Labs/Radiology:    Latest Ref Rng & Units 12/31/2021    3:33 AM 12/29/2021    3:42 AM 12/28/2021    3:53 AM  CBC  WBC 4.0 - 10.5 K/uL 10.0  10.4  10.5   Hemoglobin 12.0 - 15.0 g/dL 10.8  10.7  10.7   Hematocrit 36.0 - 46.0 % 32.0  32.5  31.7   Platelets 150 - 400 K/uL 496  317  271     Lab Results  Component Value Date   NA 132 (L) 01/01/2022   K 4.3 01/01/2022   CL 101 01/01/2022   CO2 24 01/01/2022      Assessment/Plan: Sepsis due to cellulitis of right forearm:  -Sepsis physiology improving -continue IV antibiotics -urine/blood cultures without growth -Wound culture shows group A strep  Acute metabolic encephalopathy: Due to sepsis/pyelonephritis-resolved-completely awake and alert.  AKI: Hemodynamically mediated-in the setting of sepsis-improved with IV fluids.  Acute respiratory failure with hypoxia Acute on chronic diastolic congestive heart failure -Likely precipitated by aggressive hydration in the setting of sepsis -oxygen requirements currently at 1L [place] -BNP elevated but trending down -CT chest shows persistent pulmonary edema and bilateral pleural effusions -She has been getting intermittent doses of IV Lasix -Overall respiratory status is improving -Repeat chest x-ray in a.m. -Hopeful to wean off of oxygen prior to discharge  Hyponatremia:  Due to dehydration-improving with IVF.  Persistent A-fib with RVR:  -likely precipitated by sepsis -cardiology following -currently on toprol (dose being adjusted) -she converted to sinus rhythm overnight 7/7 -went back into A fib in morning of 7/9 -After receiving another amiodarone bolus, she became bradycardic and hypotensive.  This improved after receiving IV  fluids -Appears to be maintaining sinus rhythm -Started back on oral amiodarone -anticoagulated with eliquis  HTN: currently on Toprol-XL -started back on losartan   GERD: Continue PPI  Right forearm laceration/cellulitis: Following accidental drop of a American doll on her arm (several days PTA) -woc for dressings -ultrasound of right forearm does not show any clear abscess -Treated with vancomycin  -keep elevated -wound culture showing group B strep -MRI did not show any deep infection -Seen by Ortho, appreciate input, no surgical intervention needed at this time -change antibiotics to cefadroxil, continue dressing changes per WOC, keep arm elevated  Cardiomyopathy -Ejection fraction 45 to 50% -Suspect decline in ejection fraction since prior study impacted by sepsis -Cardiology following -She is on beta-blockers -started on ARB -She does have some volume overload, will continue with IV Lasix for one more day  BMI: Estimated body mass index is 24.96 kg/m as calculated from the following:   Height as of this encounter: '5\' 7"'$  (1.702 m).   Weight as of this encounter: 72.3 kg.   Code status:   Code Status: Full Code   DVT Prophylaxis: SCDs Start: 12/25/21 0759 apixaban (ELIQUIS) tablet 5 mg    Family Communication: sister updated at the bedside   Disposition Plan: Status is: Inpatient Remains inpatient appropriate because: Resolving sepsis physiology-A-fib RVR-noted    Planned Discharge Destination:Home   Diet: Diet Order             Diet heart healthy/carb modified Room service appropriate? Yes; Fluid consistency: Thin  Diet effective now                     Antimicrobial agents: Anti-infectives (From admission, onward)    Start     Dose/Rate Route Frequency Ordered Stop   01/01/22 1545  cefadroxil (DURICEF) capsule 500 mg        500 mg Oral 2 times daily 01/01/22 1449     12/27/21 1200  vancomycin (VANCOREADY) IVPB 1250 mg/250 mL  Status:   Discontinued        1,250 mg 166.7 mL/hr over 90 Minutes Intravenous Every 24 hours 12/26/21 1052 01/01/22 1449   12/26/21 2300  ceFEPIme (MAXIPIME) 2 g in sodium chloride 0.9 % 100 mL IVPB  Status:  Discontinued        2 g 200 mL/hr over 30 Minutes Intravenous Every 12 hours 12/26/21 1052 12/29/21 1031   12/26/21 1145  vancomycin (VANCOCIN) IVPB 1000 mg/200 mL premix  Status:  Discontinued        1,000 mg 200 mL/hr over 60 Minutes Intravenous  Once 12/26/21 1046 12/26/21 1050   12/26/21 1145  vancomycin (VANCOREADY) IVPB 1500 mg/300 mL        1,500 mg 150 mL/hr over 120 Minutes Intravenous  Once 12/26/21 1050 12/26/21 1441   12/26/21 1130  ceFEPIme (MAXIPIME) 2 g in sodium chloride 0.9 % 100 mL IVPB        2 g 200 mL/hr over 30 Minutes Intravenous  Once 12/26/21 1046 12/26/21 1312   12/25/21 2200  cefTRIAXone (ROCEPHIN) 1 g in sodium chloride 0.9 % 100 mL IVPB  Status:  Discontinued  1 g 200 mL/hr over 30 Minutes Intravenous Every 24 hours 12/25/21 0749 12/26/21 1046   12/24/21 2215  cefTRIAXone (ROCEPHIN) 2 g in sodium chloride 0.9 % 100 mL IVPB        2 g 200 mL/hr over 30 Minutes Intravenous  Once 12/24/21 2207 12/24/21 2316        MEDICATIONS: Scheduled Meds:  amiodarone  200 mg Oral BID   apixaban  5 mg Oral BID   cefadroxil  500 mg Oral BID   Chlorhexidine Gluconate Cloth  6 each Topical Daily   estradiol  0.5 mg Oral Daily   ezetimibe  10 mg Oral Daily   losartan  50 mg Oral Daily   metoprolol succinate  100 mg Oral Daily   pantoprazole  40 mg Oral Daily   polyethylene glycol  17 g Oral Daily   silver sulfADIAZINE   Topical Daily   Continuous Infusions:   PRN Meds:.acetaminophen **OR** acetaminophen, acetaminophen, HYDROcodone-acetaminophen, levalbuterol, magnesium hydroxide, metoprolol tartrate, ondansetron **OR** ondansetron (ZOFRAN) IV   I have personally reviewed following labs and imaging studies  LABORATORY DATA: CBC: Recent Labs  Lab  12/26/21 0452 12/27/21 0939 12/28/21 0353 12/29/21 0342 12/31/21 0333  WBC 18.8* 15.9* 10.5 10.4 10.0  HGB 10.5* 11.3* 10.7* 10.7* 10.8*  HCT 32.3* 34.5* 31.7* 32.5* 32.0*  MCV 93.4 92.5 90.8 92.1 91.7  PLT 220 264 271 317 496*    Basic Metabolic Panel: Recent Labs  Lab 12/27/21 0058 12/27/21 0939 12/28/21 0353 12/29/21 0342 12/30/21 0339 12/31/21 0333 01/01/22 1252  NA 132*   < > 133* 134* 134* 136 132*  K 3.6   < > 3.3* 4.0 3.7 4.0 4.3  CL 105   < > 103 103 104 107 101  CO2 21*   < > '23 22 23 24 24  '$ GLUCOSE 126*   < > 107* 188* 100* 89 99  BUN 11   < > '11 14 16 14 18  '$ CREATININE 0.97   < > 0.97 1.05* 1.09* 0.97 1.01*  CALCIUM 7.9*   < > 8.0* 8.2* 8.5* 8.8* 8.8*  MG 1.8  --   --  1.9  --  1.9  --    < > = values in this interval not displayed.    GFR: Estimated Creatinine Clearance: 40.3 mL/min (A) (by C-G formula based on SCr of 1.01 mg/dL (H)).  Liver Function Tests: Recent Labs  Lab 12/27/21 0058  AST 17  ALT 13  ALKPHOS 53  BILITOT 0.3  PROT 5.6*  ALBUMIN 2.5*   No results for input(s): "LIPASE", "AMYLASE" in the last 168 hours. No results for input(s): "AMMONIA" in the last 168 hours.  Coagulation Profile: No results for input(s): "INR", "PROTIME" in the last 168 hours.   Cardiac Enzymes: No results for input(s): "CKTOTAL", "CKMB", "CKMBINDEX", "TROPONINI" in the last 168 hours.  BNP (last 3 results) No results for input(s): "PROBNP" in the last 8760 hours.  Lipid Profile: No results for input(s): "CHOL", "HDL", "LDLCALC", "TRIG", "CHOLHDL", "LDLDIRECT" in the last 72 hours.  Thyroid Function Tests: No results for input(s): "TSH", "T4TOTAL", "FREET4", "T3FREE", "THYROIDAB" in the last 72 hours.   Anemia Panel: No results for input(s): "VITAMINB12", "FOLATE", "FERRITIN", "TIBC", "IRON", "RETICCTPCT" in the last 72 hours.  Urine analysis:    Component Value Date/Time   COLORURINE YELLOW 12/25/2021 0115   APPEARANCEUR CLEAR 12/25/2021  0115   LABSPEC 1.013 12/25/2021 0115   PHURINE 6.0 12/25/2021 0115   GLUCOSEU NEGATIVE 12/25/2021  [place] (A) 12/25/2021 0115   BILIRUBINUR NEGATIVE 12/25/2021 0115   KETONESUR NEGATIVE 12/25/2021 0115   PROTEINUR NEGATIVE 12/25/2021 0115   NITRITE NEGATIVE 12/25/2021 0115   LEUKOCYTESUR NEGATIVE 12/25/2021 0115    Sepsis Labs: Lactic Acid, Venous    Component Value Date/Time   LATICACIDVEN 1.8 12/25/2021 0016    MICROBIOLOGY: Recent Results (from the past 240 hour(s))  Blood Culture (routine x 2)     Status: None   Collection Time: 12/24/21  9:58 PM   Specimen: BLOOD LEFT WRIST  Result Value Ref Range Status   Specimen Description BLOOD LEFT WRIST  Final   Special Requests   Final    BOTTLES DRAWN AEROBIC AND ANAEROBIC Blood Culture adequate volume   Culture   Final    NO GROWTH 5 DAYS Performed at [facility], [address], [place], [place] [address]    Report Status 12/29/2021 FINAL  Final  Blood Culture (routine x 2)     Status: None   Collection Time: 12/24/21  9:58 PM   Specimen: BLOOD RIGHT FOREARM  Result Value Ref Range Status   Specimen Description BLOOD RIGHT FOREARM  Final   Special Requests   Final    BOTTLES DRAWN AEROBIC AND ANAEROBIC Blood Culture adequate volume   Culture   Final    NO GROWTH 5 DAYS Performed at [facility], [address] [place], [place] [address]    Report Status 12/29/2021 FINAL  Final  Urine Culture     Status: None   Collection Time: 12/25/21  1:15 AM   Specimen: Urine, Clean Catch  Result Value Ref Range Status   Specimen Description   Final    URINE, CLEAN CATCH Performed at [facility], [address] [place], [place] [address]    Special Requests   Final    NONE Performed at [facility], [address] [place], [place] [address]    Culture   Final    NO GROWTH Performed at [facility], [place] [address], [place], [place] [address]    Report Status 12/26/2021 FINAL  Final  Aerobic Culture w  Gram Stain (superficial specimen)     Status: None   Collection Time: 12/26/21 11:03 AM   Specimen: Wound  Result Value Ref Range Status   Specimen Description   Final    WOUND Performed at [facility] [address] [place], [place] [address]    Special Requests   Final    NONE Performed at [facility], [address], [place], [place] [address]    Gram Stain NO WBC SEEN RARE GRAM POSITIVE COCCI IN PAIRS   Final   Culture   Final    RARE GROUP A STREP (S.PYOGENES) ISOLATED Beta hemolytic streptococci are predictably susceptible to penicillin and other beta lactams. Susceptibility testing not routinely performed. Performed at [facility], [place] [address] [place], [place] [address]    Report Status 12/28/2021 FINAL  Final  MRSA Next Gen by PCR, Nasal     Status: None   Collection Time: 12/26/21 12:22 PM   Specimen: Nasal Mucosa; Nasal Swab  Result Value Ref Range Status   MRSA by PCR Next Gen NOT DETECTED NOT DETECTED Final    Comment: (NOTE) The GeneXpert MRSA Assay (FDA approved for NASAL specimens only), is one component of a comprehensive MRSA colonization surveillance program. It is not intended to diagnose MRSA infection nor to guide or monitor treatment for MRSA infections.  Test performance is not FDA approved in patients less than 41 years old. Performed at [facility], [address], [place], [place] [address]     RADIOLOGY STUDIES/RESULTS: No results found.   LOS: 7 days   Kathie Dike, MD  [facility]    To contact the attending provider between 7A-7P or the covering provider during after hours 7P-7A, please log into the web site [email or web address] and access using universal [place] password for that web site. If you do not have the password, please call the hospital operator.  01/01/2022, 6:57 PM
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
## 2022-01-02 ENCOUNTER — Inpatient Hospital Stay (HOSPITAL_COMMUNITY): Payer: Medicare Other

## 2022-01-02 ENCOUNTER — Telehealth: Payer: Self-pay | Admitting: Cardiology

## 2022-01-02 DIAGNOSIS — A419 Sepsis, unspecified organism: Secondary | ICD-10-CM | POA: Diagnosis not present

## 2022-01-02 DIAGNOSIS — N390 Urinary tract infection, site not specified: Secondary | ICD-10-CM | POA: Diagnosis not present

## 2022-01-02 LAB — TSH: TSH: 3.064 u[IU]/mL (ref 0.350–4.500)

## 2022-01-02 LAB — BASIC METABOLIC PANEL
Anion gap: 11 (ref 5–15)
BUN: 19 mg/dL (ref 8–23)
CO2: 23 mmol/L (ref 22–32)
Calcium: 9 mg/dL (ref 8.9–10.3)
Chloride: 100 mmol/L (ref 98–111)
Creatinine, Ser: 1.1 mg/dL — ABNORMAL HIGH (ref 0.44–1.00)
GFR, Estimated: 50 mL/min — ABNORMAL LOW (ref >60)
Glucose, Bld: 100 mg/dL — ABNORMAL HIGH (ref 70–99)
Potassium: 3.9 mmol/L (ref 3.5–5.1)
Sodium: 134 mmol/L — ABNORMAL LOW (ref 135–145)

## 2022-01-02 LAB — BRAIN NATRIURETIC PEPTIDE: B Natriuretic Peptide: 258 pg/mL — ABNORMAL HIGH (ref 0.0–100.0)

## 2022-01-02 MED ORDER — SILVER SULFADIAZINE 1 % EX CREA: TOPICAL_CREAM | Freq: Every day | CUTANEOUS | 0 refills | Status: AC

## 2022-01-02 MED ORDER — CEFADROXIL 500 MG PO CAPS
500.0000 mg | ORAL_CAPSULE | Freq: Two times a day (BID) | ORAL | 0 refills | Status: AC
Start: 2022-01-02 — End: 2022-01-08

## 2022-01-02 MED ORDER — METOPROLOL TARTRATE 5 MG/5ML IV SOLN: 5.0000 mg | INTRAVENOUS | Status: DC | PRN

## 2022-01-02 MED ORDER — HYDRALAZINE HCL 20 MG/ML IJ SOLN: 10.0000 mg | INTRAMUSCULAR | Status: DC | PRN

## 2022-01-02 MED ORDER — AMIODARONE HCL 200 MG PO TABS: 100.0000 mg | ORAL_TABLET | Freq: Every day | ORAL | 6 refills | Status: DC

## 2022-01-02 MED ORDER — EZETIMIBE 10 MG PO TABS: 10.0000 mg | ORAL_TABLET | Freq: Every day | ORAL | 0 refills | Status: DC

## 2022-01-02 MED ORDER — GUAIFENESIN 100 MG/5ML PO LIQD: 5.0000 mL | ORAL | Status: DC | PRN

## 2022-01-02 MED ORDER — SENNOSIDES-DOCUSATE SODIUM 8.6-50 MG PO TABS: 1.0000 | ORAL_TABLET | Freq: Every evening | ORAL | Status: DC | PRN

## 2022-01-02 NOTE — TOC Transition Note (Signed)
Transition of Care [facility]) - CM/SW Discharge Note   Patient Details  Name: Lydia Graham MRN: [id number] Date of Birth: Feb 20, 1936  [facility] of [facility]) CM/SW Contact:  Boneta Lucks, RN Phone Number: 01/02/2022, 11:00 AM   Clinical Narrative:   Patient discharging home. Agreeable to home health. MD will order. Sent the referral to [facility] with [facility].  Final next level of care: [place] Barriers to Discharge: Barriers Resolved   Patient Goals and CMS Choice Patient states their goals for this hospitalization and ongoing recovery are:: to go home. CMS Medicare.gov Compare Post Acute Care list provided to:: Patient Choice offered to / list presented to : Patient  Discharge Placement      Patient and family notified of of transfer: 01/02/22  Discharge Plan and Services    [place]: [place] ([place]) Date [place]: 01/02/22 Time HH Agency Contacted: 1059    Readmission Risk Interventions    01/02/2022   10:59 AM  Readmission Risk Prevention Plan  Transportation Screening Complete  PCP or Specialist Appt within 5-7 Days Complete  Home Care Screening Complete  Medication Review (RN CM) Complete

## 2022-01-02 NOTE — Discharge Summary (Addendum)
Physician Discharge Summary  Lydia Graham ZOX:[id number] DOB: 1935-09-14 DOA: 12/24/2021  PCP: Glenda Chroman, MD  Admit date: 12/24/2021 Discharge date: 01/02/2022  Admitted From: Home Disposition:  Home with [facility]  Recommendations for Outpatient Follow-up:  Follow up with PCP in 1-2 weeks Please obtain BMP/CBC in one week your next doctors visit.  Cefadroxil 500 mg twice daily for 6 more days Zetia 10 mg orally daily added Cardiology recommended amiodarone 100 mg orally daily and follow-up outpatient with their service.  This will be arranged by their service as well.  Continue taking Eliquis  Home Health: Arranged by TOC Equipment/Devices: None Discharge Condition: Stable CODE STATUS: Full code Diet recommendation: Cardiac  Brief/Interim Summary:   86 y.o.  female with history of persistent atrial fibrillation, GERD who was recently diagnosed with UTI-and started on Cipro (took for 1 day) prior to admission-she was brought to the hospital for fever, altered mental status-and subsequently admitted to the hospitalist service.  Source of infection felt to be right arm wound and cellulitis.  She was started on IV antibiotics and IV fluids. She developed rapid atrial fibrillation. Cardiology consulted.  Treated with intravenous amiodarone.  As infection improved, heart rate also improved.  She converted back to sinus rhythm.  She did have evidence of volume overload and developed significant hypoxia, likely precipitated by IV hydration during sepsis.  She was diuresed and eventually weaned off oxygen. Today her cellulitis has significantly improved therefore we will discharge her home on oral antibiotic to complete total 2-week course.  Rest the recommendations per cardiology has been mentioned above. During my discharge patient's sister is at bedside and all the questions have been answered by me.    Discharge Diagnoses:  Principal Problem:   Sepsis secondary to UTI [facility]) Active  Problems:   Acute pyelonephritis   Acute metabolic encephalopathy   Sepsis ([place])   AKI (acute kidney injury) ([place])   Hyponatremia   Dehydration   Atrial fibrillation, chronic (HCC)   Essential hypertension   GERD (gastroesophageal reflux disease)   Sepsis due to cellulitis of right forearm:  Wound cultures grew group A strep.  Sepsis physiology has improved.  Cellulitis is also significantly improved, will discharge patient on 6 more days of Duricef to complete 2-week course.  Follow-up outpatient with PCP.  Acute metabolic encephalopathy: Due to sepsis/pyelonephritis-resolved-completely awake and alert.  AKI: Resolved   Acute respiratory failure with hypoxia Acute on chronic diastolic congestive heart failure -Likely precipitated by fluid overload and atrial fibrillation with RVR.  Now patient is euvolemic on room air doing well.   Hyponatremia: Likely from dehydration, improved  Persistent A-fib with RVR:  -Patient is back in normal sinus rhythm.  Currently on amiodarone.  This was likely precipitated by underlying infection.  Upon discharge cardiology recommends amiodarone 100 mg daily.  Continue home anticoagulation.   HTN: Resume home regimen   GERD: Continue PPI    Cardiomyopathy -Seen by cardiology team.  Echocardiogram showed EF of 45%.  Medication management per their service.  Outpatient follow-up has also been arranged.   BMI: Estimated body mass index is 24.96 kg/m as calculated from the following:   Height as of this encounter: '5\' 7"'$  (1.702 m).   Weight as of this encounter: 72.3 kg.    Consultations: Cardiology  Subjective: Feels great no complaints.  Sister is present at bedside and all the questions answered.  Discharge Exam: Vitals:   01/02/22 0531 01/02/22 1240  BP: (!) 145/53 (!) 137/52  Pulse:  63 66  Resp: 14   Temp: 98.4 F (36.9 C)   SpO2: 95%    Vitals:   01/01/22 1309 01/01/22 2050 01/02/22 0531 01/02/22 1240  BP: (!) 149/61 (!)  123/56 (!) 145/53 (!) 137/52  Pulse: 66 66 63 66  Resp: '20 18 14   '$ Temp: 98.7 F (37.1 C) 98.1 F (36.7 C) 98.4 F (36.9 C)   TempSrc: Oral Oral Oral   SpO2: 92% 100% 95%   Weight:      Height:        General: Pt is alert, awake, not in acute distress Cardiovascular: RRR, S1/S2 +, no rubs, no gallops Respiratory: CTA bilaterally, no wheezing, no rhonchi Abdominal: Soft, NT, ND, bowel sounds + Extremities: no edema, no cyanosis. Right upper extremity erythema and swelling is improved.  Minimal warmth noted.  Some purulent discharge but improved. Her infection is clearly regressed from previously demarcated line      Discharge Instructions  Discharge Instructions     Face-to-face encounter (required for Medicare/Medicaid patients)   Complete by: As directed    I Mattilynn Forrer Chirag Arien Morine certify that this patient is under my care and that I, or a nurse practitioner or physician's assistant working with me, had a face-to-face encounter that meets the physician face-to-face encounter requirements with this patient on 01/02/2022. The encounter with the patient was in whole, or in part for the following medical condition(s) which is the primary reason for home health care weakness from cellulitis and CHF exac from a fib rvr   The encounter with the patient was in whole, or in part, for the following medical condition, which is the primary reason for home health care: weakness, cellulitis, chf exac   I certify that, based on my findings, the following services are medically necessary home health services:  Physical therapy Nursing     Reason for Medically Necessary Home Health Services:  Skilled Nursing- Complex Wound Care Therapy- Therapeutic Exercises to Increase Strength and Endurance     My clinical findings support the need for the above services: Unsafe ambulation due to balance issues   Further, I certify that my clinical findings support that this patient is homebound due to:  Ambulates short distances less than 300 feet   Home Health   Complete by: As directed    To provide the following care/treatments:  PT OT RN        Allergies as of 01/02/2022       Reactions   Rosuvastatin Other (See Comments)   MYALGIA, Muscle pain   Lisinopril Cough   Other Other (See Comments)   PAIN MEDICATIONS/ANESTHESIA  MADE BP DROP LOW    Singulair [montelukast Sodium] Other (See Comments)   fatigue        Medication List     STOP taking these medications    predniSONE 10 MG tablet Commonly known as: DELTASONE       TAKE these medications    amiodarone 200 MG tablet Commonly known as: PACERONE Take 0.5 tablets (100 mg total) by mouth daily. What changed: See the new instructions.   amLODipine 5 MG tablet Commonly known as: NORVASC TAKE 1 TABLET AT 8AM AND 1 TABLET AT 8PM.   ARTIFICIAL TEARS OP Place 2 drops into both eyes 2 (two) times daily as needed (for dry eyes).   CALCIUM 600+D PO Take 1 tablet by mouth daily.   cefadroxil 500 MG capsule Commonly known as: DURICEF Take 1 capsule (500 mg total) by mouth  2 (two) times daily for 6 days.   Eliquis 5 MG Tabs tablet Generic drug: apixaban TAKE (1) TABLET TWICE DAILY.   estradiol 0.5 MG tablet Commonly known as: ESTRACE Take 0.5 mg by mouth daily.   ezetimibe 10 MG tablet Commonly known as: ZETIA Take 1 tablet (10 mg total) by mouth daily. Start taking on: January 03, 2022   fluticasone 50 MCG/ACT nasal spray Commonly known as: FLONASE Place 2 sprays into both nostrils daily as needed for allergies.   furosemide 20 MG tablet Commonly known as: LASIX TAKE 1 TABLET BY MOUTH DAILY AS NEEDED FOR EDEMA (LEG SWELLING/SHORTNESS OF BREATH).   losartan 50 MG tablet Commonly known as: COZAAR TAKE 1 TABLET AT 10 AM AND 1 TABLET AT 10 PM.   magnesium hydroxide 400 MG/5ML suspension Commonly known as: MILK OF MAGNESIA Take 30 mLs by mouth daily as needed for mild constipation.   metoprolol  succinate 50 MG 24 hr tablet Commonly known as: TOPROL-XL TAKE 1 TABLET AT LUNCH OR IMMEDIATELY FOLLOWING A MEAL.   pantoprazole 40 MG tablet Commonly known as: PROTONIX TAKE (1) TABLET TWICE DAILY.   potassium chloride SA 20 MEQ tablet Commonly known as: KLOR-CON M Take 1 tablet (20 mEq total) by mouth as needed (take on days you take your Furosemide).   Salonpas 3.06-29-08 % Ptch Generic drug: Camphor-Menthol-Methyl Sal Place 1 patch onto the skin at bedtime as needed (foot pain).   silver sulfADIAZINE 1 % cream Commonly known as: SILVADENE Apply topically daily for 10 days. Start taking on: January 03, 2022   vitamin B-12 1000 MCG tablet Commonly known as: CYANOCOBALAMIN Take 1,000 mcg by mouth daily.        Follow-up Information     Fay Records, MD Follow up on 01/21/2022.   Specialty: Cardiology Why: call the office for time on July 31st. Contact information: [address] [place] [address] [phone]         [facility].   Specialty: Home Health Services Why: They will call to schedule your first home visit.        Vyas, Dhruv B, MD Follow up in 1 week(s).   Specialty: Internal Medicine Contact information: [place] [address] [phone]         [name], MD .   Specialty: Cardiology Contact information: [place] [address] [phone]         Arnoldo Lenis, MD .   Specialty: Cardiology Contact information: [place] [address] [phone]                Allergies  Allergen Reactions   Rosuvastatin Other (See Comments)    MYALGIA, Muscle pain   Lisinopril Cough   Other Other (See Comments)    PAIN MEDICATIONS/ANESTHESIA  MADE BP DROP LOW    Singulair [Montelukast Sodium] Other (See Comments)    fatigue    You were cared for by a hospitalist during your hospital stay. If you have any questions about your  discharge medications or the care you received while you were in the hospital after you are discharged, you can call the unit and asked to speak with the hospitalist on call if the hospitalist that took care of you is not available. Once you are discharged, your primary care physician will handle any further medical issues. Please note that no refills for any discharge medications will  be authorized once you are discharged, as it is imperative that you return to your primary care physician (or establish a relationship with a primary care physician if you do not have one) for your aftercare needs so that they can reassess your need for medications and monitor your lab values.   Procedures/Studies: DG CHEST PORT 1 VIEW  Result Date: 01/02/2022 CLINICAL DATA:  Shortness of breath, sepsis. EXAM: PORTABLE CHEST 1 VIEW COMPARISON:  Chest CT and portable chest both 12/29/2021. FINDINGS: 5:09 a.m. There is mild cardiomegaly without findings of CHF. Stable mediastinum with aortic tortuosity and calcification. Small left-greater-than-right pleural effusions and overlying opacities intermixed with atelectatic bands in the left-greater-than-right lower zones continues to be noted but show mild improvement. The mid and upper lungs are clear with COPD change. There is osteopenia. IMPRESSION: Small left greater than right pleural effusions with overlying lung opacities predominantly atelectatic in appearance, with the improvement. Mid and upper lungs appear clear. COPD. Electronically Signed   By: Telford Nab M.D.   On: 01/02/2022 07:36   ECHOCARDIOGRAM COMPLETE  Result Date: 12/30/2021    ECHOCARDIOGRAM REPORT   Patient Name:   Lydia Graham Date of Exam: 12/30/2021 Medical Rec #:  [id number]          Height:       67.0 in Accession #:    [id number]         Weight:       159.4 lb Date of Birth:  01-14-1936          BSA:          1.836 m Patient Age:    49 years           BP:           151/70 mmHg Patient Gender: F                   HR:           115 bpm. Exam Location:  [facility] Procedure: 2D Echo, Cardiac Doppler and Color Doppler Indications:    Atrial Fibrillation I48.91  History:        Patient has prior history of Echocardiogram examinations, most                 recent 03/22/2021. CHF, Carotid Disease; Risk                 Factors:Hypertension.  Sonographer:    Alvino Chapel RCS Referring Phys: [place]  1. Left ventricular ejection fraction, by estimation, is 45 to 50%. The left ventricle has mildly decreased function. The left ventricle has no regional wall motion abnormalities. There is mild left ventricular hypertrophy. Left ventricular diastolic parameters are indeterminate.  2. Right ventricular systolic function is normal. The right ventricular size is normal. There is mildly elevated pulmonary artery systolic pressure.  3. Left atrial size was mildly dilated.  4. Right atrial size was mildly dilated.  5. The mitral valve is normal in structure. No evidence of mitral valve regurgitation. No evidence of mitral stenosis.  6. The aortic valve is normal in structure. Aortic valve regurgitation is not visualized. No aortic stenosis is present.  7. The inferior vena cava is normal in size with greater than 50% respiratory variability, suggesting right atrial pressure of 3 mmHg. Comparison(s): Prior images reviewed side by side. The left ventricular function is worsened. FINDINGS  Left Ventricle: Left ventricular ejection fraction, by estimation, is 45 to 50%. The  left ventricle has mildly decreased function. The left ventricle has no regional wall motion abnormalities. The left ventricular internal cavity size was normal in size. There is mild left ventricular hypertrophy. Left ventricular diastolic parameters are indeterminate.  LV Wall Scoring: The inferior wall, mid inferoseptal segment, and basal inferoseptal segment are akinetic. Right Ventricle: The right ventricular size is normal. No  increase in right ventricular wall thickness. Right ventricular systolic function is normal. There is mildly elevated pulmonary artery systolic pressure. The tricuspid regurgitant velocity is 3.06  m/s, and with an assumed right atrial pressure of 3 mmHg, the estimated right ventricular systolic pressure is 28.4 mmHg. Left Atrium: Left atrial size was mildly dilated. Right Atrium: Right atrial size was mildly dilated. Pericardium: There is no evidence of pericardial effusion. Mitral Valve: The mitral valve is normal in structure. No evidence of mitral valve regurgitation. No evidence of mitral valve stenosis. Tricuspid Valve: The tricuspid valve is normal in structure. Tricuspid valve regurgitation is not demonstrated. No evidence of tricuspid stenosis. Aortic Valve: The aortic valve is normal in structure. Aortic valve regurgitation is not visualized. No aortic stenosis is present. Pulmonic Valve: The pulmonic valve was normal in structure. Pulmonic valve regurgitation is mild. No evidence of pulmonic stenosis. Aorta: The aortic root is normal in size and structure. Venous: The inferior vena cava is normal in size with greater than 50% respiratory variability, suggesting right atrial pressure of 3 mmHg. IAS/Shunts: No atrial level shunt detected by color flow Doppler.  LEFT VENTRICLE PLAX 2D LVIDd:         4.10 cm LVIDs:         2.90 cm LV PW:         1.30 cm LV IVS:        1.30 cm LVOT diam:     1.80 cm LV SV:         39 LV SV Index:   21 LVOT Area:     2.54 cm  RIGHT VENTRICLE RV S prime:     14.80 cm/s TAPSE (M-mode): 1.5 cm LEFT ATRIUM             Index        RIGHT ATRIUM           Index LA diam:        3.90 cm 2.12 cm/m   RA Area:     20.20 cm LA Vol (A2C):   71.8 ml 39.10 ml/m  RA Volume:   59.40 ml  32.35 ml/m LA Vol (A4C):   72.5 ml 39.48 ml/m LA Biplane Vol: 72.6 ml 39.54 ml/m  AORTIC VALVE LVOT Vmax:   86.00 cm/s LVOT Vmean:  62.533 cm/s LVOT VTI:    0.152 m  AORTA Ao Root diam: 3.10 cm MITRAL  VALVE                TRICUSPID VALVE MV Area (PHT): 4.73 cm     TR Peak grad:   37.5 mmHg MV Decel Time: 161 msec     TR Vmax:        306.00 cm/s MV E velocity: 111.00 cm/s                             SHUNTS                             Systemic VTI:  0.15 m  Systemic Diam: 1.80 cm Candee Furbish MD Electronically signed by Candee Furbish MD Signature Date/Time: 12/30/2021/12:24:54 PM    Final    CT CHEST WO CONTRAST  Result Date: 12/29/2021 CLINICAL DATA:  Pneumonia EXAM: CT CHEST WITHOUT CONTRAST TECHNIQUE: Multidetector CT imaging of the chest was performed following the standard protocol without IV contrast. RADIATION DOSE REDUCTION: This exam was performed according to the departmental dose-optimization program which includes automated exposure control, adjustment of the mA and/or kV according to patient size and/or use of iterative reconstruction technique. COMPARISON:  None Available. FINDINGS: Cardiovascular: Aortic atherosclerosis. Cardiomegaly. No pericardial effusion. Mediastinum/Nodes: No enlarged mediastinal, hilar, or axillary lymph nodes. Small hiatal hernia. Thyroid gland, trachea, and esophagus demonstrate no significant findings. Lungs/Pleura: Moderate bilateral pleural effusions with associated atelectasis or consolidation. Diffuse interlobular septal thickening. Scattered heterogeneous and ground-glass airspace opacities throughout the lungs. Upper Abdomen: No acute abnormality. Musculoskeletal: No chest wall abnormality. No suspicious osseous lesions identified. IMPRESSION: 1. Moderate bilateral pleural effusions with associated atelectasis or consolidation. 2. Diffuse interlobular septal thickening consistent with pulmonary edema. 3. Scattered heterogeneous and ground-glass airspace opacities throughout the lungs, consistent with nonspecific infection or inflammation. 4. Cardiomegaly. Aortic Atherosclerosis (ICD10-I70.0). Electronically Signed   By: Delanna Ahmadi M.D.    On: 12/29/2021 17:28   DG CHEST PORT 1 VIEW  Result Date: 12/29/2021 CLINICAL DATA:  Shortness of breath. EXAM: PORTABLE CHEST 1 VIEW COMPARISON:  12/27/2021 FINDINGS: Similar retrocardiac left base collapse/consolidation with small left pleural effusion. Interval increase in streaky opacity at the right base, likely atelectasis. The cardio pericardial silhouette is enlarged. Interstitial markings are diffusely coarsened with chronic features. Telemetry leads overlie the chest. IMPRESSION: 1. Similar retrocardiac left base collapse/consolidation with small left pleural effusion. 2. Interval increase in streaky opacity at the right base, likely atelectasis. Electronically Signed   By: Misty Stanley M.D.   On: 12/29/2021 13:25   MR FOREARM RIGHT W WO CONTRAST  Result Date: 12/28/2021 CLINICAL DATA:  Cellulitis. EXAM: MRI OF THE RIGHT FOREARM WITHOUT AND WITH CONTRAST TECHNIQUE: Multiplanar, multisequence MR imaging of the right forearm was performed before and after the administration of intravenous contrast. CONTRAST:  63m GADAVIST GADOBUTROL 1 MMOL/ML IV SOLN COMPARISON:  Radiographs dated December 28, 2021 FINDINGS: Bones/Joint/Cartilage Marrow signal is within normal limits. No evidence of fracture or osteonecrosis. Ligaments Interosseous ligament between the radius and ulna is intact. Muscles and Tendons No intramuscular hematoma or fluid collection. Tendons of the flexor and extensor compartment are intact. Soft tissues Skin thickening and subcutaneous soft tissue edema prominent about the posterior aspect of the elbow and throughout the forearm. No drainable fluid collection or abscess. IMPRESSION: 1. Generalized skin thickening and subcutaneous soft tissue edema prominent about the posterior aspect of the elbow. No drainable fluid collection or abscess. 2. Muscles and tendons are intact. No evidence of intramuscular collection or tendon tear. 3. Marrow signal is within normal limits without evidence of  osteomyelitis. Electronically Signed   By: IKeane PoliceD.O.   On: 12/28/2021 16:30   DG Forearm Right  Result Date: 12/28/2021 CLINICAL DATA:  Cellulitis EXAM: RIGHT FOREARM - 2 VIEW COMPARISON:  None Available. FINDINGS: No evidence of fracture or other focal bone lesions. Advanced degenerative changes of the first [facility]. Soft tissue edema of the forearm. IMPRESSION: No acute osseous abnormality. Electronically Signed   By: LYetta GlassmanM.D.   On: 12/28/2021 12:05   DG CHEST PORT 1 VIEW  Result Date: 12/27/2021 CLINICAL DATA:  Hypoxia. EXAM: PORTABLE CHEST  1 VIEW COMPARISON:  12/24/2021. FINDINGS: The heart is enlarged the mediastinal contour is within normal limits. Atherosclerotic calcification of the aorta is noted. Interstitial prominence is noted bilaterally. There is a small left pleural effusion with atelectasis or infiltrate. No pneumothorax. Degenerative changes are present in the thoracic spine. IMPRESSION: 1. Cardiomegaly. 2. Small left pleural effusion with atelectasis or infiltrate. Electronically Signed   By: Brett Fairy M.D.   On: 12/27/2021 01:15   [place] RT UPPER EXTREM LTD SOFT TISSUE NON VASCULAR  Result Date: 12/26/2021 CLINICAL DATA:  Right upper extremity edema. Fever and altered mental status. Urinary tract infection. EXAM: ULTRASOUND RIGHT UPPER EXTREMITY LIMITED TECHNIQUE: Ultrasound examination of the upper extremity soft tissues was performed in the area of clinical concern. COMPARISON:  None Available. FINDINGS: Infiltrative subcutaneous edema noted along the antecubital fossa and anterior forearm. No drainable fluid collection or visible solid mass. No obvious occlusion of adjacent superficial vessels. IMPRESSION: 1. Infiltrative subcutaneous edema along the anterior forearm and antecubital region. Cellulitis is not excluded. No drainable abscess is currently observed. Electronically Signed   By: Van Clines M.D.   On: 12/26/2021 12:05   DG Chest Port 1  View  Result Date: 12/24/2021 CLINICAL DATA:  Questionable sepsis with low blood pressure. EXAM: PORTABLE CHEST 1 VIEW COMPARISON:  December 23, 2017 FINDINGS: The heart size and mediastinal contours are within normal limits. There is mild calcification of the aortic arch. Low lung volumes are seen with diffuse, chronic appearing increased interstitial lung markings. Mild, stable linear scarring and/or atelectasis is seen within the mid to lower left lung and bilateral lung bases. A chronic appearing deformity is seen along the lateral aspect of the sixth left rib. Multilevel degenerative changes seen throughout the thoracic spine. IMPRESSION: Mild, stable bilateral atelectatic changes. Electronically Signed   By: Virgina Norfolk M.D.   On: 12/24/2021 22:26     The results of significant diagnostics from this hospitalization (including imaging, microbiology, ancillary and laboratory) are listed below for reference.     Microbiology: Recent Results (from the past 240 hour(s))  Blood Culture (routine x 2)     Status: None   Collection Time: 12/24/21  9:58 PM   Specimen: BLOOD LEFT WRIST  Result Value Ref Range Status   Specimen Description BLOOD LEFT WRIST  Final   Special Requests   Final    BOTTLES DRAWN AEROBIC AND ANAEROBIC Blood Culture adequate volume   Culture   Final    NO GROWTH 5 DAYS Performed at [facility], [address], [place], [place] [address]    Report Status 12/29/2021 FINAL  Final  Blood Culture (routine x 2)     Status: None   Collection Time: 12/24/21  9:58 PM   Specimen: BLOOD RIGHT FOREARM  Result Value Ref Range Status   Specimen Description BLOOD RIGHT FOREARM  Final   Special Requests   Final    BOTTLES DRAWN AEROBIC AND ANAEROBIC Blood Culture adequate volume   Culture   Final    NO GROWTH 5 DAYS Performed at [facility], [address], [place], [place] [address]    Report Status 12/29/2021 FINAL  Final  Urine Culture     Status: None   Collection Time:  12/25/21  1:15 AM   Specimen: Urine, Clean Catch  Result Value Ref Range Status   Specimen Description   Final    URINE, CLEAN CATCH Performed at [facility], [address] [place], [place] [address]    Special  Requests   Final    NONE Performed at [facility], [address], [place], [place] [address]    Culture   Final    NO GROWTH Performed at [facility], [place] [address] [place], [place] [address]    Report Status 12/26/2021 FINAL  Final  Aerobic Culture w Gram Stain (superficial specimen)     Status: None   Collection Time: 12/26/21 11:03 AM   Specimen: Wound  Result Value Ref Range Status   Specimen Description   Final    WOUND Performed at [facility], [address], [place], [place] [address]    Special Requests   Final    NONE Performed at [facility], [address], [place], [place] [address]    Gram Stain NO WBC SEEN RARE GRAM POSITIVE COCCI IN PAIRS   Final   Culture   Final    RARE GROUP A STREP (S.PYOGENES) ISOLATED Beta hemolytic streptococci are predictably susceptible to penicillin and other beta lactams. Susceptibility testing not routinely performed. Performed at [facility], [place] [address] [place], [place] [address]    Report Status 12/28/2021 FINAL  Final  MRSA Next Gen by PCR, Nasal     Status: None   Collection Time: 12/26/21 12:22 PM   Specimen: Nasal Mucosa; Nasal Swab  Result Value Ref Range Status   MRSA by PCR Next Gen NOT DETECTED NOT DETECTED Final    Comment: (NOTE) The GeneXpert MRSA Assay (FDA approved for NASAL specimens only), is one component of a comprehensive MRSA colonization surveillance program. It is not intended to diagnose MRSA infection nor to guide or monitor treatment for MRSA infections. Test performance is not FDA approved in patients less than 2 years old. Performed at [facility], [address], [place],  [address]      Labs: BNP (last 3 results) Recent Labs    12/30/21 0339  12/31/21 0334 01/02/22 0546  BNP 502.0* 415.0* 419.3*   Basic Metabolic Panel: Recent Labs  Lab 12/27/21 0058 12/27/21 0939 12/29/21 0342 12/30/21 0339 12/31/21 0333 01/01/22 1252 01/02/22 0546  NA 132*   < > 134* 134* 136 132* 134*  K 3.6   < > 4.0 3.7 4.0 4.3 3.9  CL 105   < > 103 104 107 101 100  CO2 21*   < > '22 23 24 24 23  '$ GLUCOSE 126*   < > 188* 100* 89 99 100*  BUN 11   < > '14 16 14 18 19  '$ CREATININE 0.97   < > 1.05* 1.09* 0.97 1.01* 1.10*  CALCIUM 7.9*   < > 8.2* 8.5* 8.8* 8.8* 9.0  MG 1.8  --  1.9  --  1.9  --   --    < > = values in this interval not displayed.   Liver Function Tests: Recent Labs  Lab 12/27/21 0058  AST 17  ALT 13  ALKPHOS 53  BILITOT 0.3  PROT 5.6*  ALBUMIN 2.5*   No results for input(s): "LIPASE", "AMYLASE" in the last 168 hours. No results for input(s): "AMMONIA" in the last 168 hours. CBC: Recent Labs  Lab 12/27/21 0939 12/28/21 0353 12/29/21 0342 12/31/21 0333  WBC 15.9* 10.5 10.4 10.0  HGB 11.3* 10.7* 10.7* 10.8*  HCT 34.5* 31.7* 32.5* 32.0*  MCV 92.5 90.8 92.1 91.7  PLT 264 271 317 496*   Cardiac Enzymes: No results for input(s): "CKTOTAL", "CKMB", "CKMBINDEX", "TROPONINI" in the last 168 hours. BNP: Invalid input(s): "  POCBNP" CBG: No results for input(s): "GLUCAP" in the last 168 hours. D-Dimer No results for input(s): "DDIMER" in the last 72 hours. Hgb A1c No results for input(s): "HGBA1C" in the last 72 hours. Lipid Profile No results for input(s): "CHOL", "HDL", "LDLCALC", "TRIG", "CHOLHDL", "LDLDIRECT" in the last 72 hours. Thyroid function studies Recent Labs    01/02/22 0800  TSH 3.064   Anemia work up No results for input(s): "VITAMINB12", "FOLATE", "FERRITIN", "TIBC", "IRON", "RETICCTPCT" in the last 72 hours. Urinalysis    Component Value Date/Time   COLORURINE YELLOW 12/25/2021 0115   APPEARANCEUR CLEAR 12/25/2021 0115   LABSPEC 1.013 12/25/2021 0115   PHURINE 6.0 12/25/2021 0115   GLUCOSEU  NEGATIVE 12/25/2021 0115   HGBUR SMALL (A) 12/25/2021 0115   BILIRUBINUR NEGATIVE 12/25/2021 0115   KETONESUR NEGATIVE 12/25/2021 0115   PROTEINUR NEGATIVE 12/25/2021 0115   NITRITE NEGATIVE 12/25/2021 0115   LEUKOCYTESUR NEGATIVE 12/25/2021 0115   Sepsis Labs Recent Labs  Lab 12/27/21 0939 12/28/21 0353 12/29/21 0342 12/31/21 0333  WBC 15.9* 10.5 10.4 10.0   Microbiology Recent Results (from the past 240 hour(s))  Blood Culture (routine x 2)     Status: None   Collection Time: 12/24/21  9:58 PM   Specimen: BLOOD LEFT WRIST  Result Value Ref Range Status   Specimen Description BLOOD LEFT WRIST  Final   Special Requests   Final    BOTTLES DRAWN AEROBIC AND ANAEROBIC Blood Culture adequate volume   Culture   Final    NO GROWTH 5 DAYS Performed at [facility], [address] [place], [place] [address]    Report Status 12/29/2021 FINAL  Final  Blood Culture (routine x 2)     Status: None   Collection Time: 12/24/21  9:58 PM   Specimen: BLOOD RIGHT FOREARM  Result Value Ref Range Status   Specimen Description BLOOD RIGHT FOREARM  Final   Special Requests   Final    BOTTLES DRAWN AEROBIC AND ANAEROBIC Blood Culture adequate volume   Culture   Final    NO GROWTH 5 DAYS Performed at [facility], [address], [place], [place] [address]    Report Status 12/29/2021 FINAL  Final  Urine Culture     Status: None   Collection Time: 12/25/21  1:15 AM   Specimen: Urine, Clean Catch  Result Value Ref Range Status   Specimen Description   Final    URINE, CLEAN CATCH Performed at [facility], [address], [place], [place] [address]    Special Requests   Final    NONE Performed at [facility], [address], [place], [place] [address]    Culture   Final    NO GROWTH Performed at [facility], [place] [address], [place], [place] [address]    Report Status 12/26/2021 FINAL  Final  Aerobic Culture w Gram Stain (superficial specimen)     Status: None   Collection Time:  12/26/21 11:03 AM   Specimen: Wound  Result Value Ref Range Status   Specimen Description   Final    WOUND Performed at [facility], [address] [place],  [address]    Special Requests   Final    NONE Performed at [facility], [address], [place], [place] [address]    Gram Stain NO WBC SEEN RARE GRAM POSITIVE COCCI IN PAIRS   Final   Culture   Final    RARE GROUP A STREP (S.PYOGENES) ISOLATED Beta hemolytic streptococci  are predictably susceptible to penicillin and other beta lactams. Susceptibility testing not routinely performed. Performed at [facility], [place] [address], [place], [place] [address]    Report Status 12/28/2021 FINAL  Final  MRSA Next Gen by PCR, Nasal     Status: None   Collection Time: 12/26/21 12:22 PM   Specimen: Nasal Mucosa; Nasal Swab  Result Value Ref Range Status   MRSA by PCR Next Gen NOT DETECTED NOT DETECTED Final    Comment: (NOTE) The GeneXpert MRSA Assay (FDA approved for NASAL specimens only), is one component of a comprehensive MRSA colonization surveillance program. It is not intended to diagnose MRSA infection nor to guide or monitor treatment for MRSA infections. Test performance is not FDA approved in patients less than 87 years old. Performed at [facility], [address], [place], [place] [address]      Time coordinating discharge:  I have spent 35 minutes face to face with the patient and on the ward discussing the patients care, assessment, plan and disposition with other care givers. >50% of the time was devoted counseling the patient about the risks and benefits of treatment/Discharge disposition and coordinating care.   SIGNED:   Damita Lack, MD  [facility] 01/02/2022, 1:18 PM   If 7PM-7AM, please contact night-coverage

## 2022-01-02 NOTE — Telephone Encounter (Signed)
Daughter called stating patient was released from the hospital today and needs to schedule a sooner appointment with Dr. Harl Bowie in [place].

## 2022-01-02 NOTE — Progress Notes (Signed)
Patient ambulated around unit. Tolerated very well. Oxygen saturation was checked once back in room and it was 100% on room air.

## 2022-01-04 ENCOUNTER — Ambulatory Visit (INDEPENDENT_AMBULATORY_CARE_PROVIDER_SITE_OTHER): Payer: Medicare Other | Admitting: Cardiology

## 2022-01-04 ENCOUNTER — Encounter: Payer: Self-pay | Admitting: Cardiology

## 2022-01-04 VITALS — BP 124/64 | HR 66 | Ht 67.0 in | Wt 155.4 lb

## 2022-01-04 DIAGNOSIS — I519 Heart disease, unspecified: Secondary | ICD-10-CM | POA: Diagnosis not present

## 2022-01-04 DIAGNOSIS — I5022 Chronic systolic (congestive) heart failure: Secondary | ICD-10-CM

## 2022-01-04 DIAGNOSIS — I480 Paroxysmal atrial fibrillation: Secondary | ICD-10-CM

## 2022-01-04 DIAGNOSIS — Z8619 Personal history of other infectious and parasitic diseases: Secondary | ICD-10-CM | POA: Diagnosis not present

## 2022-01-04 DIAGNOSIS — D6869 Other thrombophilia: Secondary | ICD-10-CM

## 2022-01-04 MED ORDER — AMIODARONE HCL 200 MG PO TABS: 100.0000 mg | ORAL_TABLET | Freq: Every day | ORAL | Status: DC

## 2022-01-04 NOTE — Progress Notes (Signed)
Clinical Summary Lydia Graham is a 86 y.o.female  1.Afib - followed by EP Dr Rayann Heman - has been on amio '100mg'$  qod   - no significant palpitations - normal thyroid, normal LFTs - no bleeding on eliquis   -admit 12/2021 with afib with RVR in setting of sepsis due to cellulitis - loaded with IV amio, converted to SR with medications - taking amio 100g daily, had been on '100mg'$  qod prior to admission - no bleeding on eliquis.      2.Systolic dysfunction 09/2704 echo LVEF 45-50%, no MR - this was in setting of admission with sepsis, afib with RVR - no recent edmea    3. HTN - compliant with meds     4. Bilateral LE edema - no recent issues since hospital discharge.   5. Carotid stenosis - 05/2021 study 1-39% bilateral disease   6. Mitral regurgitation - moderate by echo 2019 12/2021 echo LVEF 45-50%, no MR Past Medical History:  Diagnosis Date   Arthritis    oa, all over - multiple areas    Carotid artery occlusion    Dysrhythmia    AFib    GERD (gastroesophageal reflux disease)    Headache    h/o migraines    Hypertension    PAF (paroxysmal atrial fibrillation) ([place])    a. multiple DCCV's in 2019 and eventually required initiation of Amiodarone with successful DCCV after this. b. recurrent in 12/2021 while admitted for Urosepsis     Allergies  Allergen Reactions   Rosuvastatin Other (See Comments)    MYALGIA, Muscle pain   Lisinopril Cough   Other Other (See Comments)    PAIN MEDICATIONS/ANESTHESIA  MADE BP DROP LOW    Singulair [Montelukast Sodium] Other (See Comments)    fatigue     Current Outpatient Medications  Medication Sig Dispense Refill   amiodarone (PACERONE) 200 MG tablet Take 0.5 tablets (100 mg total) by mouth daily. 15 tablet 6   amLODipine (NORVASC) 5 MG tablet TAKE 1 TABLET AT 8AM AND 1 TABLET AT 8PM. 60 tablet 4   apixaban (ELIQUIS) 5 MG TABS tablet TAKE (1) TABLET TWICE DAILY. 180 tablet 2   Calcium Carbonate-Vitamin D (CALCIUM  600+D PO) Take 1 tablet by mouth daily.     Camphor-Menthol-Methyl Sal (SALONPAS) 3.06-29-08 % PTCH Place 1 patch onto the skin at bedtime as needed (foot pain).     cefadroxil (DURICEF) 500 MG capsule Take 1 capsule (500 mg total) by mouth 2 (two) times daily for 6 days. 12 capsule 0   estradiol (ESTRACE) 0.5 MG tablet Take 0.5 mg by mouth daily.      ezetimibe (ZETIA) 10 MG tablet Take 1 tablet (10 mg total) by mouth daily. 30 tablet 0   fluticasone (FLONASE) 50 MCG/ACT nasal spray Place 2 sprays into both nostrils daily as needed for allergies.      furosemide (LASIX) 20 MG tablet TAKE 1 TABLET BY MOUTH DAILY AS NEEDED FOR EDEMA (LEG SWELLING/SHORTNESS OF BREATH). 90 tablet 0   Hypromellose (ARTIFICIAL TEARS OP) Place 2 drops into both eyes 2 (two) times daily as needed (for dry eyes).     losartan (COZAAR) 50 MG tablet TAKE 1 TABLET AT 10 AM AND 1 TABLET AT 10 PM. 60 tablet 6   magnesium hydroxide (MILK OF MAGNESIA) 400 MG/5ML suspension Take 30 mLs by mouth daily as needed for mild constipation.     metoprolol succinate (TOPROL-XL) 50 MG 24 hr tablet TAKE 1 TABLET AT LUNCH  OR IMMEDIATELY FOLLOWING A MEAL. 30 tablet 6   pantoprazole (PROTONIX) 40 MG tablet TAKE (1) TABLET TWICE DAILY. 60 tablet 4   potassium chloride SA (K-DUR,KLOR-CON) 20 MEQ tablet Take 1 tablet (20 mEq total) by mouth as needed (take on days you take your Furosemide).     silver sulfADIAZINE (SILVADENE) 1 % cream Apply topically daily for 10 days. 50 g 0   vitamin B-12 (CYANOCOBALAMIN) 1000 MCG tablet Take 1,000 mcg by mouth daily.     No current facility-administered medications for this visit.     Past Surgical History:  Procedure Laterality Date   ABDOMINAL HYSTERECTOMY     APPENDECTOMY     BACK SURGERY     CARDIOVERSION N/A 12/19/2017   Procedure: CARDIOVERSION;  Surgeon: Arnoldo Lenis, MD;  [facility];  Service: Endoscopy;  Laterality: N/A;   CARDIOVERSION N/A 12/26/2017   Procedure:  CARDIOVERSION;  Surgeon: [name], MD;  Location: [place];  Service: Cardiovascular;  Laterality: N/A;   CARDIOVERSION N/A 01/26/2018   Procedure: CARDIOVERSION;  Surgeon: Jerline Pain, MD;  Location[facility] END[facility]COPY;  Service: Cardiovascular;  Laterality: N/A;   CAROTID ENDARTERECTOMY     CATARACT EXTRACTION W/PHACO Right 03/09/2021   Procedure: CATARACT EXTRACTION PHACO AND INTRAOCULAR LENS PLACEMENT with Placement of Corticosteroid ([place]);  Surgeon: [name], MD;  Location: [facility];  Service: Ophthalmology;  Laterality: Right;  CDE 9.16   CATARACT EXTRACTION W/PHACO Left 04/02/2021   Procedure: CATARACT EXTRACTION PHACO AND INTRAOCULAR LENS PLACEMENT LEFT EYE;  Surgeon: [name], MD;  Location: [facility];  Service: Ophthalmology;  Laterality: Left;  left CDE=6.75   ENDARTERECTOMY Left 03/27/2017   Procedure: ENDARTERECTOMY CAROTID LEFT;  Surgeon: [name], MD;  Location: [place];  Service: Vascular;  Laterality: Left;   INNER EAR SURGERY Right    puntured eardrum- repaired 2x's    KNEE ARTHROPLASTY     NASAL SINUS SURGERY     deviated septum   PATCH ANGIOPLASTY Left 03/27/2017   Procedure: PATCH ANGIOPLASTY LEFT CAROTID ARTERY USING Rueben Bash BIOLOGIC PATCH;  Surgeon: [name], MD;  Location: [place];  Service: Vascular;  Laterality: Left;   TONSILLECTOMY     TOTAL KNEE ARTHROPLASTY Right 10/23/2016   Procedure: RIGHT TOTAL KNEE ARTHROPLASTY;  Surgeon: [name], MD;  Location: [place];  Service: Orthopedics;  Laterality: Right;     Allergies  Allergen Reactions   Rosuvastatin Other (See Comments)    MYALGIA, Muscle pain   Lisinopril Cough   Other Other (See Comments)    PAIN MEDICATIONS/ANESTHESIA  MADE BP DROP LOW    Singulair [Montelukast Sodium] Other (See Comments)    fatigue      Family History  Problem Relation Age of Onset   Stroke Mother    Rheum arthritis Sister      Social History Ms. Lydia Graham reports that she has  never smoked. She has never used smokeless tobacco. Ms. Lydia Graham reports no history of alcohol use.   Review of Systems CONSTITUTIONAL: No weight loss, fever, chills, weakness or fatigue.  HEENT: Eyes: No visual loss, blurred vision, double vision or yellow sclerae.No hearing loss, sneezing, congestion, runny nose or sore throat.  SKIN: No rash or itching.  CARDIOVASCULAR: per hpi RESPIRATORY: No shortness of breath, cough or sputum.  GASTROINTESTINAL: No anorexia, nausea, vomiting or diarrhea. No abdominal pain or blood.  GENITOURINARY: No burning on urination, no polyuria NEUROLOGICAL: No headache, dizziness, syncope, paralysis, ataxia, numbness or tingling in  the extremities. No change in bowel or bladder control.  MUSCULOSKELETAL: No muscle, back pain, joint pain or stiffness.  LYMPHATICS: No enlarged nodes. No history of splenectomy.  PSYCHIATRIC: No history of depression or anxiety.  ENDOCRINOLOGIC: No reports of sweating, cold or heat intolerance. No polyuria or polydipsia.  Marland Kitchen   Physical Examination Today's Vitals   01/04/22 0853  BP: 124/64  Pulse: 66  SpO2: 95%  Weight: 155 lb 6.4 oz (70.5 kg)  Height: '5\' 7"'$  (1.702 m)   Body mass index is 24.34 kg/m.  Gen: resting comfortably, no acute distress HEENT: no scleral icterus, pupils equal round and reactive, no palptable cervical adenopathy,  CV: RRR, no mr/g no jvd Resp: Clear to auscultation bilaterally GI: abdomen is soft, non-tender, non-distended, normal bowel sounds, no hepatosplenomegaly MSK: extremities are warm, no edema.  Skin: warm, no rash Neuro:  no focal deficits Psych: appropriate affect   Diagnostic Studies  02/2021 echo Global longitudinal strain was attempted.  IMPRESSIONS     1. Left ventricular ejection fraction, by estimation, is 65 to 70%. The  left ventricle has normal function. The left ventricle has no regional  wall motion abnormalities. Left ventricular diastolic parameters are   indeterminate.   2. Right ventricular systolic function is normal. The right ventricular  size is normal. There is mildly elevated pulmonary artery systolic  pressure.   3. Left atrial size was mildly dilated.   4. The mitral valve is normal in structure. Trivial mitral valve  regurgitation. No evidence of mitral stenosis.   5. The aortic valve is tricuspid. Aortic valve regurgitation is not  visualized. No aortic stenosis is present.   6. The inferior vena cava is normal in size with greater than 50%  respiratory variability, suggesting right atrial pressure of 3 mmHg.   Comparison(s): Previous Echo was technically challenging due to A-fib. LV  EF was '@50'$ %, with moderate MR and TR, and biatrial enlargement.   12/2021 echo   1. Left ventricular ejection fraction, by estimation, is 45 to 50%. The  left ventricle has mildly decreased function. The left ventricle has no  regional wall motion abnormalities. There is mild left ventricular  hypertrophy. Left ventricular diastolic  parameters are indeterminate.   2. Right ventricular systolic function is normal. The right ventricular  size is normal. There is mildly elevated pulmonary artery systolic  pressure.   3. Left atrial size was mildly dilated.   4. Right atrial size was mildly dilated.   5. The mitral valve is normal in structure. No evidence of mitral valve  regurgitation. No evidence of mitral stenosis.   6. The aortic valve is normal in structure. Aortic valve regurgitation is  not visualized. No aortic stenosis is present.   7. The inferior vena cava is normal in size with greater than 50%  respiratory variability, suggesting right atrial pressure of 3 mmHg.   05/2021 carotid US Summary:  Right Carotid: Velocities in the right ICA are consistent with a 1-39%  stenosis.   Left Carotid: Velocities in the left ICA are consistent with a 1-39%  stenosis.   Vertebrals:  Bilateral vertebral arteries demonstrate antegrade  flow.  Subclavians: Normal flow hemodynamics were seen in bilateral subclavian               arteries.     Assessment and Plan  Afib/acquired thrombophilia -recent admission with afib with RVR in setting of sepsis, previously had done well with amio just '100mg'$  every other day.  -continue amio  $'100mg'o$  daily x 1 month, then back to '100mg'$  every other day - continue eliquis for stroke prevention.    2. HTN -occasional high numbers at home since discharge, they will update [place] in 1 week on bp's. May consider aldactone if ongoing HTN. Recent issues with low Na likely avoid thiazide.    3. Systolic dysfunction - new finding during recent admission with a fib with RVR and sepsis, I suspect this will be transient now that sepsis has resolved and afib controlled - reopeat echo in October, continue beta blocker ARB. If ongoing dysfunction in October then would intensify her medication regimen.    Repeat echo early October, f/u with me late October   Arnoldo Lenis, M.D.

## 2022-01-04 NOTE — Patient Instructions (Addendum)
Medication Instructions:  Continue the Amiodarone at '100mg'$  DAILY, then on 02/02/22 decrease to EVERY OTHER DAY Continue all other medications.     Labwork: none  Testing/Procedures: Your physician has requested that you have an echocardiogram. Echocardiography is a painless test that uses sound waves to create images of your heart. It provides your doctor with information about the size and shape of your heart and how well your heart's chambers and valves are working. This procedure takes approximately one hour. There are no restrictions for this procedure. - DUE EARLY OCTOBER  Office will contact with results via phone, letter or mychart.     Follow-Up:  Mid - late October   Any Other Special Instructions Will Be Listed Below (If Applicable). Please call / my chart the office in 1 week to update [place] on blood pressure readings.   If you need a refill on your cardiac medications before your next appointment, please call your pharmacy.

## 2022-01-06 IMAGING — MG MM DIGITAL DIAGNOSTIC UNILAT*L* W/ TOMO W/ CAD
4 series · 4 of 12 positions shown · non-contrast
Comparison: Previous exam(s).

CLINICAL DATA: Patient returns after screening study for evaluation
of possible LEFT breast mass.

EXAM:
DIGITAL DIAGNOSTIC LEFT MAMMOGRAM WITH CAD AND TOMO
ULTRASOUND LEFT BREAST

[L MLO synth-2D]
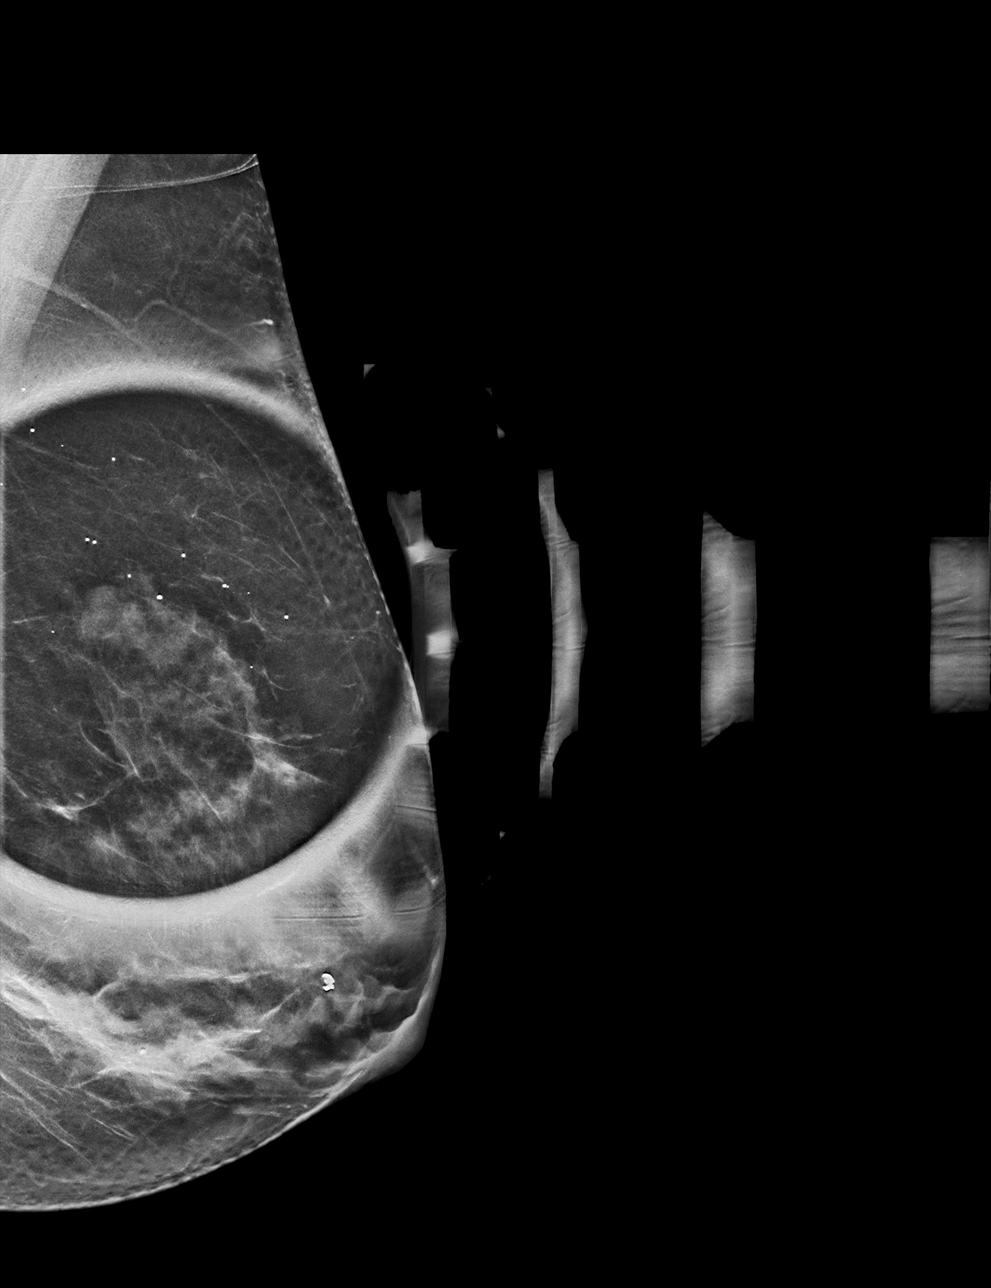

[L CC synth-2D]
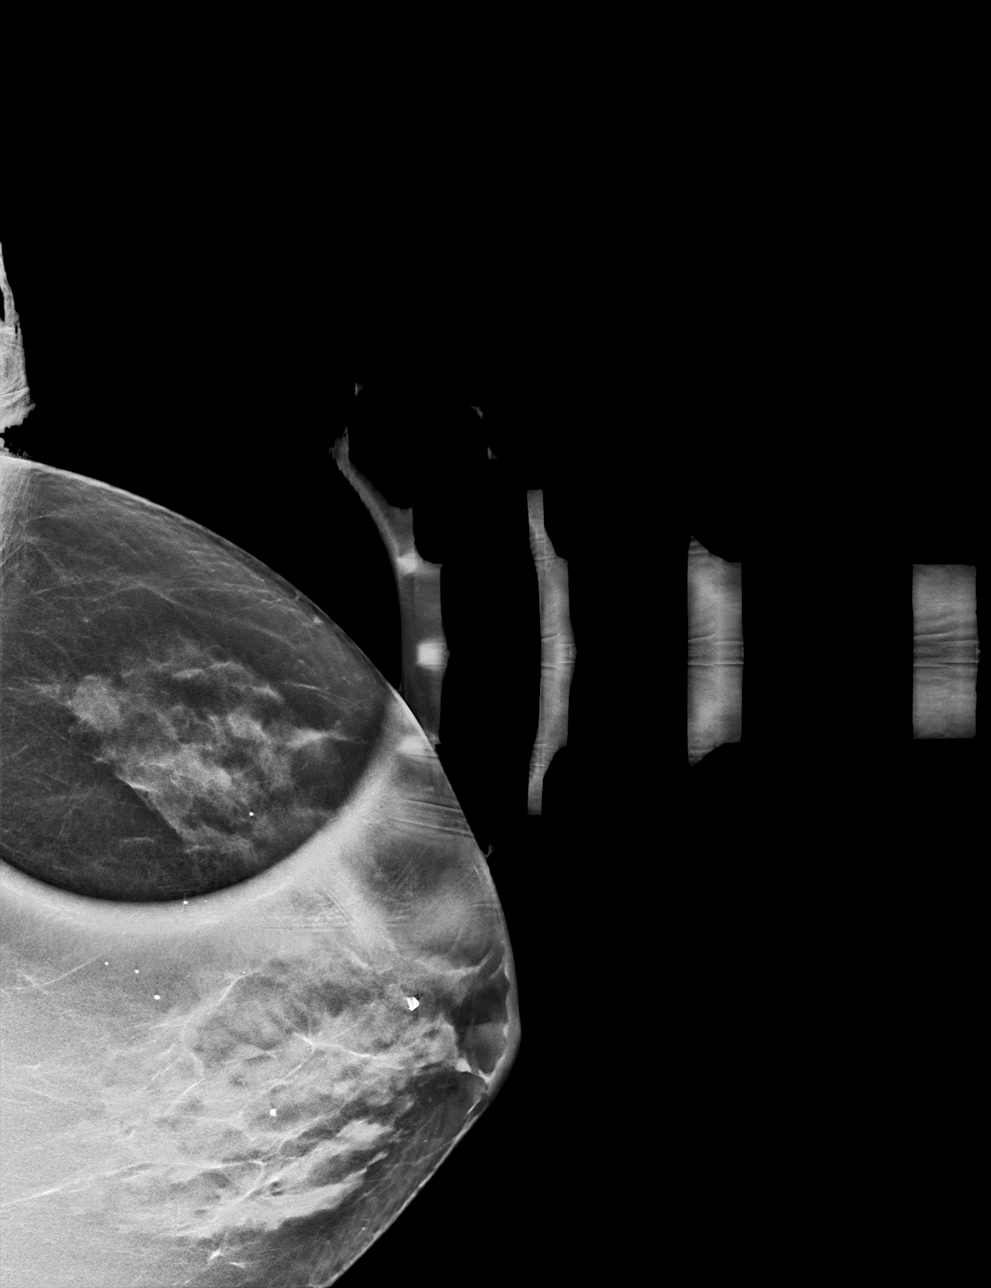

[L MLO tomo · tomo slice 31/60.0]
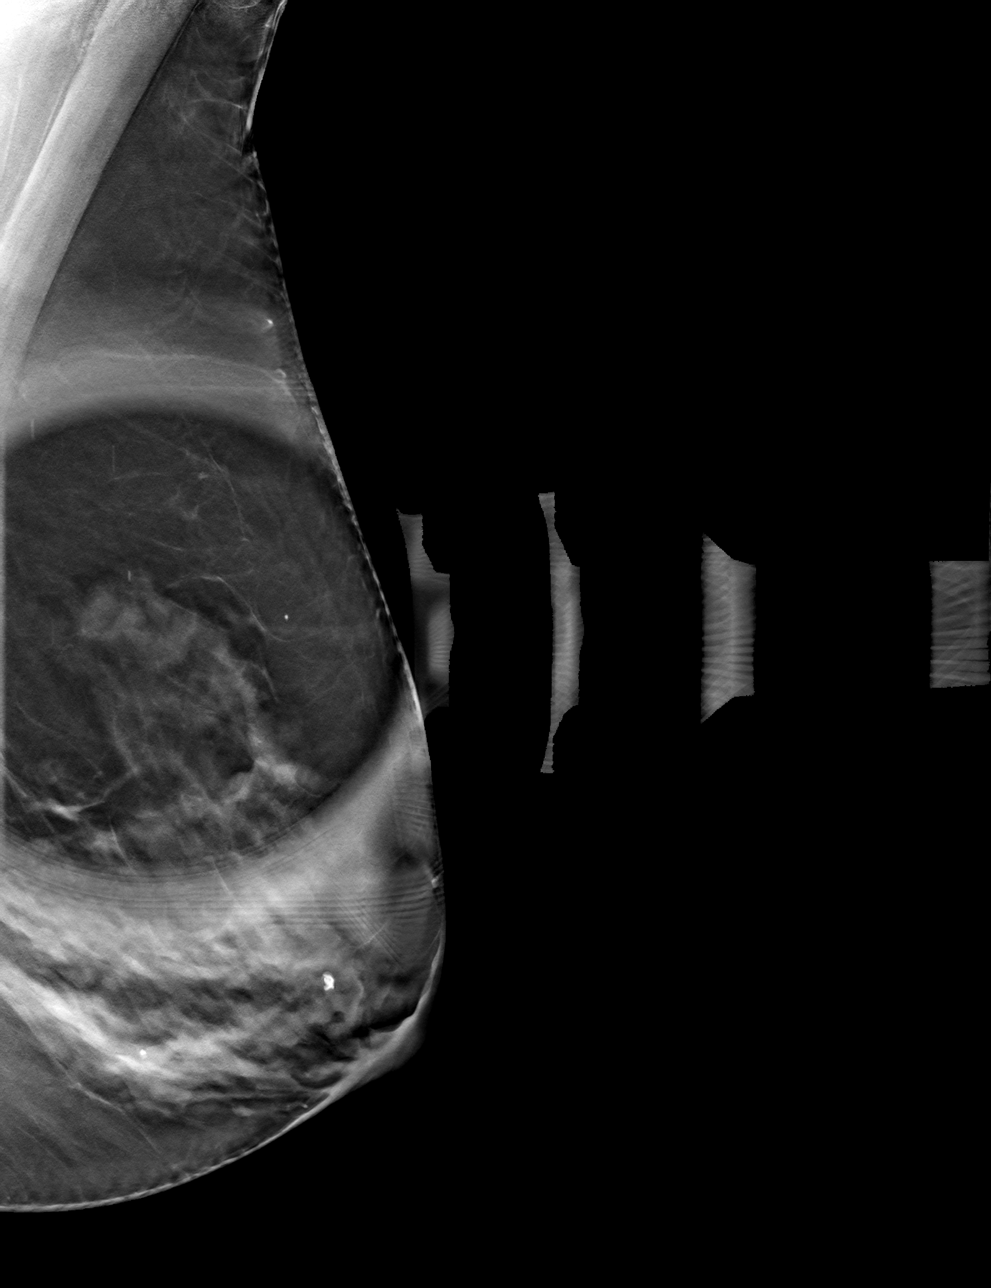

[L CC tomo · tomo slice 26/51.0]
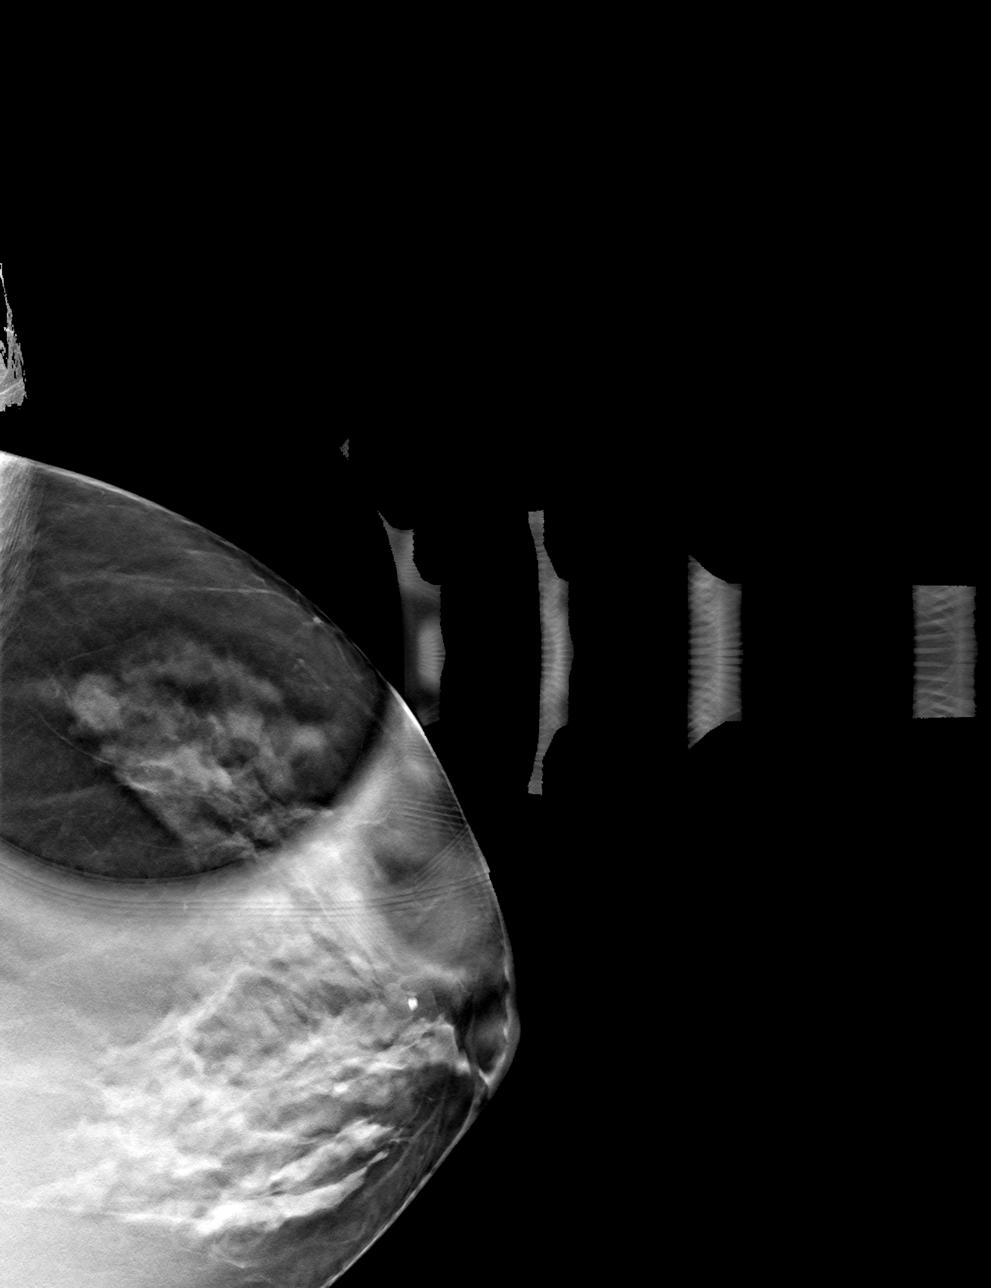

[4 of 12 positions shown; findings below may reference images not displayed]

ACR Breast Density Category c: The breast tissue is heterogeneously
dense, which may obscure small masses.
FINDINGS: Additional 2-D and 3-D images are performed. These views confirm
presence of an oval partially obscured mass in the LATERAL portion
of the LEFT breast and further evaluated with ultrasound.

Mammographic images were processed with CAD.  The

Targeted ultrasound is performed, showing a simple cyst in the 2
o'clock location of the LEFT breast 5 centimeters from the nipple
which measures 0.8 x 0.9 x 0.6 centimeters. An adjacent smaller cyst
is 0.6 x 0.4 x 0.7 centimeters. No solid masses or areas of acoustic
shadowing are identified.
IMPRESSION: Benign simple cysts in the LATERAL portion of the LEFT breast
accounting for the mammographic abnormality. No mammographic or
ultrasound evidence for malignancy.

RECOMMENDATION:
Screening mammogram in one year.(Code:[id number])

I have discussed the findings and recommendations with the patient.
If applicable, a reminder letter will be sent to the patient
regarding the next appointment.

BI-RADS CATEGORY  2: Benign.

## 2022-01-07 DIAGNOSIS — M159 Polyosteoarthritis, unspecified: Secondary | ICD-10-CM | POA: Diagnosis not present

## 2022-01-07 DIAGNOSIS — J449 Chronic obstructive pulmonary disease, unspecified: Secondary | ICD-10-CM | POA: Diagnosis not present

## 2022-01-07 DIAGNOSIS — Z9181 History of falling: Secondary | ICD-10-CM | POA: Diagnosis not present

## 2022-01-07 DIAGNOSIS — Z7901 Long term (current) use of anticoagulants: Secondary | ICD-10-CM | POA: Diagnosis not present

## 2022-01-07 DIAGNOSIS — I6529 Occlusion and stenosis of unspecified carotid artery: Secondary | ICD-10-CM | POA: Diagnosis not present

## 2022-01-07 DIAGNOSIS — J9601 Acute respiratory failure with hypoxia: Secondary | ICD-10-CM | POA: Diagnosis not present

## 2022-01-07 DIAGNOSIS — I4819 Other persistent atrial fibrillation: Secondary | ICD-10-CM | POA: Diagnosis not present

## 2022-01-07 DIAGNOSIS — L03113 Cellulitis of right upper limb: Secondary | ICD-10-CM | POA: Diagnosis not present

## 2022-01-07 DIAGNOSIS — S51811A Laceration without foreign body of right forearm, initial encounter: Secondary | ICD-10-CM | POA: Diagnosis not present

## 2022-01-07 DIAGNOSIS — M8580 Other specified disorders of bone density and structure, unspecified site: Secondary | ICD-10-CM | POA: Diagnosis not present

## 2022-01-07 DIAGNOSIS — I5033 Acute on chronic diastolic (congestive) heart failure: Secondary | ICD-10-CM | POA: Diagnosis not present

## 2022-01-07 DIAGNOSIS — N10 Acute tubulo-interstitial nephritis: Secondary | ICD-10-CM | POA: Diagnosis not present

## 2022-01-07 DIAGNOSIS — I429 Cardiomyopathy, unspecified: Secondary | ICD-10-CM | POA: Diagnosis not present

## 2022-01-07 DIAGNOSIS — I700 Atherosclerosis of aorta: Secondary | ICD-10-CM | POA: Diagnosis not present

## 2022-01-07 DIAGNOSIS — G43909 Migraine, unspecified, not intractable, without status migrainosus: Secondary | ICD-10-CM | POA: Diagnosis not present

## 2022-01-07 DIAGNOSIS — I110 Hypertensive heart disease with heart failure: Secondary | ICD-10-CM | POA: Diagnosis not present

## 2022-01-07 DIAGNOSIS — B950 Streptococcus, group A, as the cause of diseases classified elsewhere: Secondary | ICD-10-CM | POA: Diagnosis not present

## 2022-01-07 DIAGNOSIS — K219 Gastro-esophageal reflux disease without esophagitis: Secondary | ICD-10-CM | POA: Diagnosis not present

## 2022-01-08 DIAGNOSIS — Z961 Presence of intraocular lens: Secondary | ICD-10-CM | POA: Diagnosis not present

## 2022-01-08 DIAGNOSIS — H43813 Vitreous degeneration, bilateral: Secondary | ICD-10-CM | POA: Diagnosis not present

## 2022-01-09 DIAGNOSIS — Z299 Encounter for prophylactic measures, unspecified: Secondary | ICD-10-CM | POA: Diagnosis not present

## 2022-01-09 DIAGNOSIS — I10 Essential (primary) hypertension: Secondary | ICD-10-CM | POA: Diagnosis not present

## 2022-01-09 DIAGNOSIS — I4891 Unspecified atrial fibrillation: Secondary | ICD-10-CM | POA: Diagnosis not present

## 2022-01-09 DIAGNOSIS — I700 Atherosclerosis of aorta: Secondary | ICD-10-CM | POA: Diagnosis not present

## 2022-01-09 DIAGNOSIS — L03113 Cellulitis of right upper limb: Secondary | ICD-10-CM | POA: Diagnosis not present

## 2022-01-14 ENCOUNTER — Ambulatory Visit: Payer: Medicare Other | Admitting: Physician Assistant

## 2022-01-21 ENCOUNTER — Ambulatory Visit: Payer: Medicare Other | Admitting: Internal Medicine

## 2022-01-22 ENCOUNTER — Ambulatory Visit (INDEPENDENT_AMBULATORY_CARE_PROVIDER_SITE_OTHER): Payer: Medicare Other | Admitting: Family Medicine

## 2022-01-22 ENCOUNTER — Encounter: Payer: Self-pay | Admitting: Family Medicine

## 2022-01-22 VITALS — BP 138/72 | HR 70 | Ht 67.0 in | Wt 154.1 lb

## 2022-01-22 DIAGNOSIS — L03113 Cellulitis of right upper limb: Secondary | ICD-10-CM | POA: Diagnosis not present

## 2022-01-22 MED ORDER — MUPIROCIN 2 % EX OINT: 1.0000 | TOPICAL_OINTMENT | Freq: Every day | CUTANEOUS | 0 refills | Status: DC

## 2022-01-22 MED ORDER — MUPIROCIN 2 % EX OINT: 1.0000 | TOPICAL_OINTMENT | Freq: Two times a day (BID) | CUTANEOUS | 0 refills | Status: DC

## 2022-01-22 NOTE — Progress Notes (Signed)
New Patient Office Visit  Subjective:  Patient ID: Lydia Graham, female    DOB: June 22, 1936  Age: 86 y.o. MRN: [id number]  CC:  Chief Complaint  Patient presents with   New Patient (Initial Visit)    Pt establishing care, previously seen by [facility], pt c/o right hand swelling was seen at ED on 12/24/2021, arm is still swollen.     HPI Lydia Graham is a 86 y.o. female with past medical history of hypertension, A-fib on Eliquis, GERD  presents for establishing care. Cellulitis of the right forearm: treated with Duricef in the ED on 12/24/2021  and discharged home with 6 days of Duricef, completing a 2-week course. She reports applying silver sulfadiazine daily at bedtime. She c/o of mild tenderness with palpation. No open wounds or drainage were noted. She voices concerns that her arm is still swollen.  Past Medical History:  Diagnosis Date   Arthritis    oa, all over - multiple areas    Carotid artery occlusion    Dysrhythmia    AFib    GERD (gastroesophageal reflux disease)    Headache    h/o migraines    Hypertension    PAF (paroxysmal atrial fibrillation) ([place])    a. multiple DCCV's in 2019 and eventually required initiation of Amiodarone with successful DCCV after this. b. recurrent in 12/2021 while admitted for Urosepsis    Past Surgical History:  Procedure Laterality Date   ABDOMINAL HYSTERECTOMY     APPENDECTOMY     BACK SURGERY     CARDIOVERSION N/A 12/19/2017   Procedure: CARDIOVERSION;  Surgeon: Arnoldo Lenis, MD;  [facility];  Service: Endoscopy;  Laterality: N/A;   CARDIOVERSION N/A 12/26/2017   Procedure: CARDIOVERSION;  Surgeon: [name], MD;  Location: [place];  Service: Cardiovascular;  Laterality: N/A;   CARDIOVERSION N/A 01/26/2018   Procedure: CARDIOVERSION;  Surgeon: Jerline Pain, MD;  Location: [facility] ENDOSCOPY;  Service: Cardiovascular;  Laterality: N/A;   CAROTID ENDARTERECTOMY     CATARACT EXTRACTION W/PHACO  Right 03/09/2021   Procedure: CATARACT EXTRACTION PHACO AND INTRAOCULAR LENS PLACEMENT with Placement of Corticosteroid ([place]);  Surgeon: [name], MD;  Location: [facility];  Service: Ophthalmology;  Laterality: Right;  CDE 9.16   CATARACT EXTRACTION W/PHACO Left 04/02/2021   Procedure: CATARACT EXTRACTION PHACO AND INTRAOCULAR LENS PLACEMENT LEFT EYE;  Surgeon: [name], MD;  Location: [facility];  Service: Ophthalmology;  Laterality: Left;  left CDE=6.75   ENDARTERECTOMY Left 03/27/2017   Procedure: ENDARTERECTOMY CAROTID LEFT;  Surgeon: [name], MD;  Location: [place];  Service: Vascular;  Laterality: Left;   INNER EAR SURGERY Right    puntured eardrum- repaired 2x's    KNEE ARTHROPLASTY     NASAL SINUS SURGERY     deviated septum   PATCH ANGIOPLASTY Left 03/27/2017   Procedure: PATCH ANGIOPLASTY LEFT CAROTID ARTERY USING Rueben Bash BIOLOGIC PATCH;  Surgeon: [name], MD;  Location: [place];  Service: Vascular;  Laterality: Left;   TONSILLECTOMY     TOTAL KNEE ARTHROPLASTY Right 10/23/2016   Procedure: RIGHT TOTAL KNEE ARTHROPLASTY;  Surgeon: [name], MD;  Location: [place];  Service: Orthopedics;  Laterality: Right;    Family History  Problem Relation Age of Onset   Stroke Mother    Rheum arthritis Sister     Social History   Socioeconomic History   Marital status: Single    Spouse name: Not on file   Number  of children: Not on file   Years of education: Not on file   Highest education level: Not on file  Occupational History   Not on file  Tobacco Use   Smoking status: Never   Smokeless tobacco: Never  Vaping Use   Vaping Use: Never used  Substance and Sexual Activity   Alcohol use: No   Drug use: No   Sexual activity: Not Currently    Birth control/protection: Surgical  Other Topics Concern   Not on file  Social History Narrative   Not on file   Social Determinants of Health   Financial Resource Strain: Not on file  Food  Insecurity: Not on file  Transportation Needs: Not on file  Physical Activity: Not on file  Stress: Not on file  Social Connections: Not on file  Intimate Partner Violence: Not on file    ROS Review of Systems  Constitutional:  Negative for chills, fatigue and fever.  HENT:  Negative for sinus pressure and sinus pain.   Eyes:  Negative for photophobia and visual disturbance.  Respiratory:  Negative for cough, chest tightness, shortness of breath and wheezing.   Cardiovascular:  Negative for chest pain and palpitations.  Gastrointestinal:  Negative for diarrhea, nausea and vomiting.  Endocrine: Negative for polydipsia, polyphagia and polyuria.  Genitourinary:  Negative for frequency and urgency.  Musculoskeletal:  Negative for back pain, neck pain and neck stiffness.  Skin:  Negative for rash.  Neurological:  Negative for dizziness, weakness, numbness and headaches.  Hematological:  Does not bruise/bleed easily.  Psychiatric/Behavioral:  Negative for self-injury and suicidal ideas.     Objective:   Today's Vitals: BP 138/72 (BP Location: Left Arm)   Pulse 70   Ht '5\' 7"'$  (1.702 m)   Wt 154 lb 1.9 oz (69.9 kg)   LMP  (LMP Unknown)   SpO2 94%   BMI 24.14 kg/m   Physical Exam HENT:     Head: Normocephalic.     Right Ear: External ear normal.     Nose: No congestion.     Mouth/Throat:     Mouth: Mucous membranes are moist.  Eyes:     Extraocular Movements: Extraocular movements intact.     Pupils: Pupils are equal, round, and reactive to light.  Cardiovascular:     Rate and Rhythm: Normal rate and regular rhythm.     Pulses: Normal pulses.     Heart sounds: Normal heart sounds.  Pulmonary:     Effort: Pulmonary effort is normal.     Breath sounds: Normal breath sounds.  Abdominal:     Palpations: Abdomen is soft.  Musculoskeletal:     Cervical back: No rigidity.     Right lower leg: No edema.     Left lower leg: No edema.  Skin:    General: Skin is warm.      Findings: No erythema or rash.     Comments:  Redness and swelling of the right upper extremities is improved with minimal warmth noted and mild tenderness with palpation.  No signs of infection noted.   Neurological:     Mental Status: She is alert and oriented to person, place, and time.  Psychiatric:     Comments: Normal affect     Assessment & Plan:   Problem List Items Addressed This Visit       Other   Cellulitis of right forearm - Primary    Reports completing the Duricef antibiotics Redness and swelling of the right  upper extremities are improved with minimal warmth noted and tenderness with palpation No signs of infection noted Continue using silver sulfadiazine at bedtime Apply Mupirocin cream ordered Pending labs      Relevant Medications   mupirocin ointment (BACTROBAN) 2 %   Other Relevant Orders   Basic Metabolic Panel (BMET)   CBC    Outpatient Encounter Medications as of 01/22/2022  Medication Sig   amiodarone (PACERONE) 200 MG tablet Take 0.5 tablets (100 mg total) by mouth daily. Till 02/02/22, then decrease to every other day - 01/04/2022   amLODipine (NORVASC) 5 MG tablet TAKE 1 TABLET AT 8AM AND 1 TABLET AT 8PM.   apixaban (ELIQUIS) 5 MG TABS tablet TAKE (1) TABLET TWICE DAILY.   Calcium Carbonate-Vitamin D (CALCIUM 600+D PO) Take 1 tablet by mouth daily.   Camphor-Menthol-Methyl Sal (SALONPAS) 3.06-29-08 % PTCH Place 1 patch onto the skin at bedtime as needed (foot pain).   estradiol (ESTRACE) 0.5 MG tablet Take 0.5 mg by mouth daily.    ezetimibe (ZETIA) 10 MG tablet Take 1 tablet (10 mg total) by mouth daily.   fluticasone (FLONASE) 50 MCG/ACT nasal spray Place 2 sprays into both nostrils daily as needed for allergies.    furosemide (LASIX) 20 MG tablet TAKE 1 TABLET BY MOUTH DAILY AS NEEDED FOR EDEMA (LEG SWELLING/SHORTNESS OF BREATH).   Hypromellose (ARTIFICIAL TEARS OP) Place 2 drops into both eyes 2 (two) times daily as needed (for dry eyes).    losartan (COZAAR) 50 MG tablet TAKE 1 TABLET AT 10 AM AND 1 TABLET AT 10 PM.   magnesium hydroxide (MILK OF MAGNESIA) 400 MG/5ML suspension Take 30 mLs by mouth daily as needed for mild constipation.   metoprolol succinate (TOPROL-XL) 50 MG 24 hr tablet TAKE 1 TABLET AT LUNCH OR IMMEDIATELY FOLLOWING A MEAL.   pantoprazole (PROTONIX) 40 MG tablet TAKE (1) TABLET TWICE DAILY.   potassium chloride SA (K-DUR,KLOR-CON) 20 MEQ tablet Take 1 tablet (20 mEq total) by mouth as needed (take on days you take your Furosemide).   vitamin B-12 (CYANOCOBALAMIN) 1000 MCG tablet Take 1,000 mcg by mouth daily.   [DISCONTINUED] mupirocin ointment (BACTROBAN) 2 % Apply 1 Application topically 2 (two) times daily.   mupirocin ointment (BACTROBAN) 2 % Apply 1 Application topically daily.   No facility-administered encounter medications on file as of 01/22/2022.    Follow-up: Return in about 3 months (around 04/24/2022).   Alvira Monday, FNP

## 2022-01-22 NOTE — Assessment & Plan Note (Addendum)
Reports completing the Duricef antibiotics Redness and swelling of the right upper extremities are improved with minimal warmth noted and tenderness with palpation No signs of infection noted Continue using silver sulfadiazine at bedtime Apply Mupirocin cream ordered Pending labs

## 2022-01-22 NOTE — Patient Instructions (Signed)
I appreciate the opportunity to provide care to you today!    Follow up:  3 months  Labs: please stop by the lab today to get your blood drawn (CBC, BMP)  Please pick up your prescription at the pharmacy   Please continue to a heart-healthy diet and increase your physical activities. Try to exercise for 86mns at least three times a week.      It was a pleasure to see you and I look forward to continuing to work together on your health and well-being. Please do not hesitate to call the office if you need care or have questions about your care.   Have a wonderful day and week. With Gratitude, GAlvira MondayMSN, FNP-BC

## 2022-01-23 DIAGNOSIS — N10 Acute tubulo-interstitial nephritis: Secondary | ICD-10-CM | POA: Diagnosis not present

## 2022-01-23 LAB — CBC
Hematocrit: 41.1 % (ref 34.0–46.6)
Hemoglobin: 13.5 g/dL (ref 11.1–15.9)
MCH: 30.2 pg (ref 26.6–33.0)
MCHC: 32.8 g/dL (ref 31.5–35.7)
MCV: 92 fL (ref 79–97)
Platelets: 368 10*3/uL (ref 150–450)
RBC: 4.47 x10E6/uL (ref 3.77–5.28)
RDW: 12.7 % (ref 11.7–15.4)
WBC: 7 10*3/uL (ref 3.4–10.8)

## 2022-01-23 LAB — BASIC METABOLIC PANEL
BUN/Creatinine Ratio: 14 (ref 12–28)
BUN: 16 mg/dL (ref 8–27)
CO2: 20 mmol/L (ref 20–29)
Calcium: 10.1 mg/dL (ref 8.7–10.3)
Chloride: 100 mmol/L (ref 96–106)
Creatinine, Ser: 1.17 mg/dL — ABNORMAL HIGH (ref 0.57–1.00)
Glucose: 96 mg/dL (ref 70–99)
Potassium: 4.9 mmol/L (ref 3.5–5.2)
Sodium: 136 mmol/L (ref 134–144)
eGFR: 46 mL/min/{1.73_m2} — ABNORMAL LOW (ref >59)

## 2022-01-23 NOTE — Progress Notes (Signed)
Please inform the patient that her labs have stabilized. I recommend increasing her fluid intake.

## 2022-01-24 ENCOUNTER — Ambulatory Visit: Payer: Medicare Other | Admitting: Cardiology

## 2022-01-24 ENCOUNTER — Other Ambulatory Visit: Payer: Self-pay | Admitting: Cardiology

## 2022-01-24 DIAGNOSIS — I5022 Chronic systolic (congestive) heart failure: Secondary | ICD-10-CM

## 2022-01-24 DIAGNOSIS — I519 Heart disease, unspecified: Secondary | ICD-10-CM

## 2022-02-26 ENCOUNTER — Other Ambulatory Visit: Payer: Self-pay | Admitting: Cardiology

## 2022-03-04 ENCOUNTER — Telehealth: Payer: Self-pay | Admitting: *Deleted

## 2022-03-04 NOTE — Telephone Encounter (Signed)
-----   Message from Arnoldo Lenis, MD sent at 03/04/2022  9:41 AM EDT ----- Home bp's reviewed, on average too high. Please start aldactone 12.'5mg'$  dialy, bmet in 2 weeks   Zandra Abts MD

## 2022-03-04 NOTE — Telephone Encounter (Signed)
Patient states that she will hold off this until seeing him in office for her visit in October.  States that when her BP gets too low she feels like she is going to pass out & has before.  Has issues when BP gets around 112 and HR in 50's.    Placed on wait list.

## 2022-03-28 ENCOUNTER — Ambulatory Visit: Payer: Medicare Other | Attending: Cardiology

## 2022-03-28 DIAGNOSIS — I5022 Chronic systolic (congestive) heart failure: Secondary | ICD-10-CM | POA: Diagnosis not present

## 2022-03-28 DIAGNOSIS — I519 Heart disease, unspecified: Secondary | ICD-10-CM | POA: Insufficient documentation

## 2022-03-28 LAB — ECHOCARDIOGRAM COMPLETE
AR max vel: 1.53 cm2
AV Peak grad: 7.2 mmHg
Ao pk vel: 1.34 m/s
Area-P 1/2: 4.36 cm2
Calc EF: 55.2 %
MV M vel: 3.54 m/s
MV Peak grad: 50 mmHg
S' Lateral: 2.88 cm
Single Plane A2C EF: 56.5 %
Single Plane A4C EF: 55.8 %

## 2022-04-02 ENCOUNTER — Other Ambulatory Visit: Payer: Self-pay | Admitting: Cardiology

## 2022-04-12 ENCOUNTER — Ambulatory Visit: Payer: Medicare Other | Admitting: Cardiology

## 2022-04-18 DIAGNOSIS — Z1231 Encounter for screening mammogram for malignant neoplasm of breast: Secondary | ICD-10-CM | POA: Diagnosis not present

## 2022-04-18 DIAGNOSIS — Z7689 Persons encountering health services in other specified circumstances: Secondary | ICD-10-CM | POA: Diagnosis not present

## 2022-04-25 ENCOUNTER — Telehealth: Payer: Self-pay | Admitting: Physical Medicine and Rehabilitation

## 2022-04-25 NOTE — Telephone Encounter (Signed)
Pt called and states that she would like to repeat Right L4-5 interlam. Last injection was in on 10/10/2019. She states it has flared back up and the pain is in the same spot? Would you like to ov or schedule? Pt is also still on BT.

## 2022-04-25 NOTE — Telephone Encounter (Signed)
Spoke with patient and scheduled ov for 04/30/22

## 2022-04-26 ENCOUNTER — Ambulatory Visit: Payer: Medicare Other | Admitting: Family Medicine

## 2022-04-30 ENCOUNTER — Ambulatory Visit (INDEPENDENT_AMBULATORY_CARE_PROVIDER_SITE_OTHER): Payer: Medicare Other

## 2022-04-30 ENCOUNTER — Encounter: Payer: Self-pay | Admitting: Physical Medicine and Rehabilitation

## 2022-04-30 ENCOUNTER — Ambulatory Visit (INDEPENDENT_AMBULATORY_CARE_PROVIDER_SITE_OTHER): Payer: Medicare Other | Admitting: Physical Medicine and Rehabilitation

## 2022-04-30 DIAGNOSIS — M25512 Pain in left shoulder: Secondary | ICD-10-CM | POA: Diagnosis not present

## 2022-04-30 DIAGNOSIS — M7918 Myalgia, other site: Secondary | ICD-10-CM

## 2022-04-30 DIAGNOSIS — G8929 Other chronic pain: Secondary | ICD-10-CM

## 2022-04-30 DIAGNOSIS — M542 Cervicalgia: Secondary | ICD-10-CM

## 2022-04-30 NOTE — Progress Notes (Signed)
Numeric Pain Rating Scale and Functional Assessment Average Pain 8   In the last MONTH (on 0-10 scale) has pain interfered with the following?  1. General activity like being  able to carry out your everyday physical activities such as walking, climbing stairs, carrying groceries, or moving a chair?  Rating(8)   Pain in left hip, tenderness. Radiates to upper left leg. Also has pain in neck and shoulder on left. Turning head and looking down makes pain worse

## 2022-04-30 NOTE — Progress Notes (Signed)
Lydia Graham - 86 y.o. female MRN [id number]  Date of birth: 1936-04-08  Office Visit Note: Visit Date: 04/30/2022 PCP: Alvira Monday, FNP Referred by: Alvira Monday, FNP  Subjective: Chief Complaint  Patient presents with   Lower Back - Pain   HPI: [place] Lydia Graham is a 86 y.o. female who comes in today for evaluation of chronic, worsening and severe left sided neck pain radiating to shoulder. Pain ongoing for several years and is exacerbated by movement and activity. She describes pain as sore, aching and tight in nature, currently rates as 8 out of 10. Some relief of pain with home exercise regimen, heating pad, rest and use of Tylenol. Also reports use of topical Lidocaine patches and topical pain creams as needed. States she does use Neurosurgeon frequently while sitting down watching television. No history of chiropractic/physical therapy treatments. No previous imaging of cervical spine. We have treated patient in the past for chronic lower back issues, did undergo left L4-L5 interlaminar epidural steroid injection in our office in 2021. She reports good relief with injection. We have also performed right greater trochanter injection in our office in 2020 with good relief. Patient states she is a very active person, does help take care of grandchildren, reports severe pain is limiting functional ability and making it difficult to sleep. Patient denies focal weakness, numbness and tingling. Patient denies recent trauma or falls.    Review of Systems  Musculoskeletal:  Positive for myalgias and neck pain.  Neurological:  Negative for tingling, sensory change, focal weakness and weakness.  All other systems reviewed and are negative.  Otherwise per HPI.  Assessment & Plan: Visit Diagnoses:    ICD-10-CM   1. Cervicalgia  M54.2 XR Cervical Spine 2 or 3 views    Trigger Point Inj    2. Chronic left shoulder pain  M25.512 XR Cervical Spine 2 or 3 views   G89.29 Trigger  Point Inj    3. Myofascial pain  M79.18 XR Cervical Spine 2 or 3 views    Trigger Point Inj       Plan: Findings:  Chronic, worsening and severe left sided neck pain radiating to left shoulder. No radicular pain down left arm. Patient continues to have severe pain despite good conservative therapies such as home exercise regimen, heating pad, rest and use of medications. Patients clinical presentation and exam are consistent with myofascial pain syndrome. She has multiple palpable trigger points noted to left levator scapulae and rhomboid muscles. Next step is to perform myofascial trigger point injections to left levator scapulae and trapezius muscles, we did complete in the office today without difficulty. If good lasting pain relief with trigger point injections we can repeat this infrequently, if her pain persists we did discuss formal physical therapy and possible dry needling. We also obtained cervical x-ray imaging that exhibits increased cervical lordosis, multi level degenerative changes, retrolisthesis and prominent facet arthropathy noted at the level of C5-C6. If her pain appears to be more radicular in nature we discussed obtaining cervical MRI imaging and possible cervical epidural steroid injection. Patient encouraged to continue with conservative therapies at home and to remain active. No red flag symptoms noted upon exam today.     Meds & Orders: No orders of the defined types were placed in this encounter.   Orders Placed This Encounter  Procedures   Trigger Point Inj   XR Cervical Spine 2 or 3 views    Follow-up: Return if symptoms worsen or  fail to improve.   Procedures: Trigger Point Inj  Date/Time: 04/30/2022 9:53 AM  Performed by: Lorine Bears, NP Authorized by: Lorine Bears, NP   Consent Given by:  Patient Site marked: the procedure site was marked   Timeout: prior to procedure the correct patient, procedure, and site was verified   Indications:   Pain Total # of Trigger Points:  2 Location: neck   Needle Size:  25 G Approach:  Dorsal Medications #1:  5 mL lidocaine 1 %; 40 mg triamcinolone acetonide 40 MG/ML; 30 mg ketorolac 30 MG/ML Patient tolerance:  Patient tolerated the procedure well with no immediate complications Comments: Myofascial trigger point injections performed to left levator scapulae and rhomboid muscles, needling technique used.        Clinical History: No specialty comments available.   She reports that she has never smoked. She has never used smokeless tobacco. No results for input(s): "HGBA1C", "LABURIC" in the last 8760 hours.  Objective:  VS:  HT:    WT:   BMI:     BP:   HR: bpm  TEMP: ( )  RESP:  Physical Exam Vitals and nursing note reviewed.  HENT:     Head: Normocephalic and atraumatic.     Right Ear: External ear normal.     Left Ear: External ear normal.     Nose: Nose normal.     Mouth/Throat:     Mouth: Mucous membranes are moist.  Eyes:     Extraocular Movements: Extraocular movements intact.  Cardiovascular:     Rate and Rhythm: Normal rate.     Pulses: Normal pulses.  Pulmonary:     Effort: Pulmonary effort is normal.  Abdominal:     General: Abdomen is flat. There is no distension.  Musculoskeletal:        General: Tenderness present.     Cervical back: Tenderness present.     Comments: No discomfort noted with flexion, extension and side-to-side rotation. Patient has good strength in the upper extremities including 5 out of 5 strength in wrist extension, long finger flexion and APB.  There is no atrophy of the hands intrinsically.  Sensation intact bilaterally. Multiple palpable trigger points noted to left levator scapulae and rhomboid muscles. Negative Hoffman's sign.   Skin:    General: Skin is warm and dry.     Capillary Refill: Capillary refill takes less than 2 seconds.  Neurological:     Mental Status: She is alert and oriented to person, place, and time.     Gait:  Gait abnormal.  Psychiatric:        Mood and Affect: Mood normal.        Behavior: Behavior normal.     Ortho Exam  Imaging: No results found.  Past Medical/Family/Surgical/Social History: Medications & Allergies reviewed per EMR, new medications updated. Patient Active Problem List   Diagnosis Date Noted   Cellulitis of right forearm 01/22/2022   Sepsis secondary to UTI ([place]) 12/26/2021   Acute pyelonephritis 93/26/7124   Acute metabolic encephalopathy 58/02/9832   Sepsis ([place]) 12/25/2021   AKI (acute kidney injury) ([place]) 12/25/2021   Hyponatremia 12/25/2021   Dehydration 12/25/2021   Atrial fibrillation, chronic ([place]) 12/25/2021   Essential hypertension 12/25/2021   GERD (gastroesophageal reflux disease) 12/25/2021   Acquired trigger finger of left middle finger 08/04/2020   Arthritis of hand 08/04/2020   Osteoarthritis of metacarpophalangeal (MCP) joint 08/03/2020   Bilateral hand pain 06/22/2020   Osteoarthritis of hands, 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  Osteoarthritis of feet, 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   PAF (paroxysmal atrial fibrillation) ([facility])    Chronic cough 12/29/2017   Atrial fibrillation with rapid ventricular response ([place]) 12/23/2017   Acute on chronic combined systolic and diastolic CHF (congestive heart failure) ([place])    Menopausal symptom 10/17/2017   Menopausal syndrome 10/17/2017   Carotid stenosis 03/27/2017   S/P total knee arthroplasty, right 10/23/2016   Unilateral primary osteoarthritis, right knee 07/13/2016   Past Medical History:  Diagnosis Date   Arthritis    oa, all over - multiple areas    Carotid artery occlusion    Dysrhythmia    AFib    GERD (gastroesophageal reflux disease)    Headache    h/o migraines    Hypertension    PAF (paroxysmal atrial fibrillation) ([place])    a. multiple DCCV's in 2019 and eventually required initiation of Amiodarone with successful DCCV after this. b. recurrent in 12/2021 while admitted for Urosepsis   Family History   Problem Relation Age of Onset   Stroke Mother    Rheum arthritis Sister    Past Surgical History:  Procedure Laterality Date   ABDOMINAL HYSTERECTOMY     APPENDECTOMY     BACK SURGERY     CARDIOVERSION N/A 12/19/2017   Procedure: CARDIOVERSION;  Surgeon: Arnoldo Lenis, MD;  [facility];  Service: Endoscopy;  Laterality: N/A;   CARDIOVERSION N/A 12/26/2017   Procedure: CARDIOVERSION;  Surgeon: [name], MD;  Location: [place];  Service: Cardiovascular;  Laterality: N/A;   CARDIOVERSION N/A 01/26/2018   Procedure: CARDIOVERSION;  Surgeon: Jerline Pain, MD;  Location[facility];  Service: Cardiovascular;  Laterality: N/A;   CAROTID ENDARTERECTOMY     CATARACT EXTRACTION W/PHACO Right 03/09/2021   Procedure: CATARACT EXTRACTION PHACO AND INTRAOCULAR LENS PLACEMENT with Placement of Corticosteroid ([place]);  Surgeon: [name], MD;  Location: [facility];  Service: Ophthalmology;  Laterality: Right;  CDE 9.16   CATARACT EXTRACTION W/PHACO Left 04/02/2021   Procedure: CATARACT EXTRACTION PHACO AND INTRAOCULAR LENS PLACEMENT LEFT EYE;  Surgeon: [name], MD;  Location: [facility];  Service: Ophthalmology;  Laterality: Left;  left CDE=6.75   ENDARTERECTOMY Left 03/27/2017   Procedure: ENDARTERECTOMY CAROTID LEFT;  Surgeon: [name], MD;  Location: [place];  Service: Vascular;  Laterality: Left;   INNER EAR SURGERY Right    puntured eardrum- repaired 2x's    KNEE ARTHROPLASTY     NASAL SINUS SURGERY     deviated septum   PATCH ANGIOPLASTY Left 03/27/2017   Procedure: PATCH ANGIOPLASTY LEFT CAROTID ARTERY USING Rueben Bash BIOLOGIC PATCH;  Surgeon: [name], MD;  Location: [place];  Service: Vascular;  Laterality: Left;   TONSILLECTOMY     TOTAL KNEE ARTHROPLASTY Right 10/23/2016   Procedure: RIGHT TOTAL KNEE ARTHROPLASTY;  Surgeon: [name], MD;  Location: [place];  Service: Orthopedics;  Laterality: Right;   Social History    Occupational History   Not on file  Tobacco Use   Smoking status: Never   Smokeless tobacco: Never  Vaping Use   Vaping Use: Never used  Substance and Sexual Activity   Alcohol use: No   Drug use: No   Sexual activity: Not Currently    Birth control/protection: Surgical

## 2022-05-02 NOTE — Progress Notes (Signed)
Cardiology Clinic Note   Patient Name: Lydia Graham Date of Encounter: 05/03/2022  Primary Care Provider:  Alvira Monday, [place] Primary Cardiologist:  [name], MD  Patient Profile    86 year old female patient with history of atrial fibrillation on amiodarone and followed by [name], systolic dysfunction with echo on 01/10/2022 revealing LVEF of 45% to 50% without MR (done in the setting of admission with sepsis, atrial fib with RVR), hypertension, hyperlipidemia, carotid artery stenosis, moderate MR by echo in 2019, with noncardiac history of arthritis, GERD, and migraine headaches.  Last seen by Dr. Harl Bowie on 01/04/2022 in the [place] office.  Labs were completed August 2023.  At that visit the patient was to continue amiodarone every other day, continue Eliquis, CHA2DS2-VASc score of 5 (age, female gender, hypertension, LV dysfunction).  Consideration for adding Aldactone if blood pressure becomes elevated..  A repeat echo was ordered.  This was completed on 03/28/2022.  This revealed improvement in LV dysfunction with an EF of 55 to 03%, grade 2 diastolic dysfunction LA mildly dilated, with mild mitral valve regurgitation and tricuspid valve regurgitation being mild to moderate.  Past Medical History    Past Medical History:  Diagnosis Date   Arthritis    oa, all over - multiple areas    Carotid artery occlusion    Dysrhythmia    AFib    GERD (gastroesophageal reflux disease)    Headache    h/o migraines    Hypertension    PAF (paroxysmal atrial fibrillation) ([place])    a. multiple DCCV's in 2019 and eventually required initiation of Amiodarone with successful DCCV after this. b. recurrent in 12/2021 while admitted for Urosepsis   Past Surgical History:  Procedure Laterality Date   ABDOMINAL HYSTERECTOMY     APPENDECTOMY     BACK SURGERY     CARDIOVERSION N/A 12/19/2017   Procedure: CARDIOVERSION;  Surgeon: Arnoldo Lenis, MD;  [facility];  Service:  Endoscopy;  Laterality: N/A;   CARDIOVERSION N/A 12/26/2017   Procedure: CARDIOVERSION;  Surgeon: [name], MD;  Location: [place];  Service: Cardiovascular;  Laterality: N/A;   CARDIOVERSION N/A 01/26/2018   Procedure: CARDIOVERSION;  Surgeon: Jerline Pain, MD;  Location: [facility] ENDOSCOPY;  Service: Cardiovascular;  Laterality: N/A;   CAROTID ENDARTERECTOMY     CATARACT EXTRACTION W/PHACO Right 03/09/2021   Procedure: CATARACT EXTRACTION PHACO AND INTRAOCULAR LENS PLACEMENT with Placement of Corticosteroid ([place]);  Surgeon: [name], MD;  Location: [facility];  Service: Ophthalmology;  Laterality: Right;  CDE 9.16   CATARACT EXTRACTION W/PHACO Left 04/02/2021   Procedure: CATARACT EXTRACTION PHACO AND INTRAOCULAR LENS PLACEMENT LEFT EYE;  Surgeon: [name], MD;  Location: [facility];  Service: Ophthalmology;  Laterality: Left;  left CDE=6.75   ENDARTERECTOMY Left 03/27/2017   Procedure: ENDARTERECTOMY CAROTID LEFT;  Surgeon: [name], MD;  Location: [place];  Service: Vascular;  Laterality: Left;   INNER EAR SURGERY Right    puntured eardrum- repaired 2x's    KNEE ARTHROPLASTY     NASAL SINUS SURGERY     deviated septum   PATCH ANGIOPLASTY Left 03/27/2017   Procedure: PATCH ANGIOPLASTY LEFT CAROTID ARTERY USING Rueben Bash BIOLOGIC PATCH;  Surgeon: [name], MD;  Location: [place];  Service: Vascular;  Laterality: Left;   TONSILLECTOMY     TOTAL KNEE ARTHROPLASTY Right 10/23/2016   Procedure: RIGHT TOTAL KNEE ARTHROPLASTY;  Surgeon: [name], MD;  Location: [place];  Service: Orthopedics;  Laterality:  Right;    Allergies  Allergies  Allergen Reactions   Rosuvastatin Other (See Comments)    MYALGIA, Muscle pain   Lisinopril Cough   Other Other (See Comments)    PAIN MEDICATIONS/ANESTHESIA  MADE BP DROP LOW    Singulair [Montelukast Sodium] Other (See Comments)    fatigue    History of Present Illness    Mrs. Gambrell returns to the office  today for ongoing assessment and management of atrial fibrillation, CHA2DS2-VASc score of 5, remaining on amiodarone and Eliquis hypertension, history of systolic dysfunction with improvement to normal with grade 3 diastolic dysfunction, per echocardiogram October 2023, chronic bilateral lower extremity edema and mild mitral regurg.  She comes today without any cardiac complaints.  She has injured her back repeatedly with significant discomfort which she uses a heating pad for an occasional NSAID.  She is being followed by orthopedics and has had back injections in the past.  She denies any issues with rapid heart rhythm, bleeding, dyspnea on exertion, or dizziness.  She is medically compliant.  She tries to remain active as she is very involved in her church.  Home Medications    Current Outpatient Medications  Medication Sig Dispense Refill   amiodarone (PACERONE) 200 MG tablet Take 0.5 tablets (100 mg total) by mouth daily. Till 02/02/22, then decrease to every other day - 01/04/2022     amLODipine (NORVASC) 5 MG tablet TAKE 1 TABLET AT 8AM AND 1 TABLET AT 8PM. 60 tablet 3   apixaban (ELIQUIS) 5 MG TABS tablet TAKE (1) TABLET TWICE DAILY. 180 tablet 2   apixaban (ELIQUIS) 5 MG TABS tablet Take 1 tablet (5 mg total) by mouth 2 (two) times daily. 42 tablet 0   Calcium Carbonate-Vitamin D (CALCIUM 600+D PO) Take 1 tablet by mouth daily.     Camphor-Menthol-Methyl Sal (SALONPAS) 3.06-29-08 % PTCH Place 1 patch onto the skin at bedtime as needed (foot pain).     estradiol (ESTRACE) 0.5 MG tablet Take 0.5 mg by mouth daily.      ezetimibe (ZETIA) 10 MG tablet Take 1 tablet (10 mg total) by mouth daily. 30 tablet 0   fluticasone (FLONASE) 50 MCG/ACT nasal spray Place 2 sprays into both nostrils daily as needed for allergies.      Hypromellose (ARTIFICIAL TEARS OP) Place 2 drops into both eyes 2 (two) times daily as needed (for dry eyes).     losartan (COZAAR) 50 MG tablet TAKE 1 TABLET AT 10 AM AND 1  TABLET AT 10 PM. 60 tablet 6   magnesium hydroxide (MILK OF MAGNESIA) 400 MG/5ML suspension Take 30 mLs by mouth daily as needed for mild constipation.     metoprolol succinate (TOPROL-XL) 50 MG 24 hr tablet TAKE 1 TABLET AT LUNCH OR IMMEDIATELY FOLLOWING A MEAL. 30 tablet 6   mupirocin ointment (BACTROBAN) 2 % Apply 1 Application topically daily. 22 g 0   pantoprazole (PROTONIX) 40 MG tablet TAKE (1) TABLET TWICE DAILY. 60 tablet 4   vitamin B-12 (CYANOCOBALAMIN) 1000 MCG tablet Take 1,000 mcg by mouth daily.     furosemide (LASIX) 20 MG tablet Take 1 tablet (20 mg total) by mouth as needed for fluid or edema. 30 tablet 2   potassium chloride SA (KLOR-CON M) 20 MEQ tablet Take 1 tablet (20 mEq total) by mouth as needed (take on days you take your Furosemide). 30 tablet 2   No current facility-administered medications for this visit.     Family History  Family History  Problem Relation Age of Onset   Stroke Mother    Rheum arthritis Sister    She indicated that her mother is deceased. She indicated that her father is deceased. She indicated that all of her three sisters are alive. She indicated that only one of her two brothers is alive.  Social History    Social History   Socioeconomic History   Marital status: Single    Spouse name: Not on file   Number of children: Not on file   Years of education: Not on file   Highest education level: Not on file  Occupational History   Not on file  Tobacco Use   Smoking status: Never   Smokeless tobacco: Never  Vaping Use   Vaping Use: Never used  Substance and Sexual Activity   Alcohol use: No   Drug use: No   Sexual activity: Not Currently    Birth control/protection: Surgical  Other Topics Concern   Not on file  Social History Narrative   Not on file   Social Determinants of Health   Financial Resource Strain: Not on file  Food Insecurity: Not on file  Transportation Needs: Not on file  Physical Activity: Not on file   Stress: Not on file  Social Connections: Not on file  Intimate Partner Violence: Not on file     Review of Systems    General:  No chills, fever, night sweats or weight changes.  Complains of chronic back pain. Cardiovascular:  No chest pain, dyspnea on exertion, edema, orthopnea, palpitations, paroxysmal nocturnal dyspnea. Dermatological: No rash, lesions/masses Respiratory: No cough, dyspnea Urologic: No hematuria, dysuria Abdominal:   No nausea, vomiting, diarrhea, bright red blood per rectum, melena, or hematemesis Neurologic:  No visual changes, wkns, changes in mental status. All other systems reviewed and are otherwise negative except as noted above.     Physical Exam    VS:  BP 124/76   Pulse 63   Ht '5\' 7"'$  (1.702 m)   Wt 153 lb 9.6 oz (69.7 kg)   LMP  (LMP Unknown)   SpO2 97%   BMI 24.06 kg/m  , BMI Body mass index is 24.06 kg/m.     GEN: Well nourished, well developed, in no acute distress. HEENT: normal. Neck: Supple, no JVD, carotid bruits, or masses. Cardiac: RRR, soft systolic murmurs, rubs, or gallops. No clubbing, cyanosis, edema.  Radials/DP/PT 2+ and equal bilaterally.  Respiratory:  Respirations regular and unlabored, clear to auscultation bilaterally. GI: Soft, nontender, nondistended, BS + x 4. MS: RA deformity of MIP and DIP bilaterally, no atrophy. Skin: warm and dry, no rash. Neuro:  Strength and sensation are intact. Psych: Normal affect.  Accessory Clinical Findings    ECG personally reviewed by me today-normal sinus rhythm, anterior Q waves are noted heart rate 63 bpm- No acute changes  Lab Results  Component Value Date   WBC 7.0 01/22/2022   HGB 13.5 01/22/2022   HCT 41.1 01/22/2022   MCV 92 01/22/2022   PLT 368 01/22/2022   Lab Results  Component Value Date   CREATININE 1.17 (H) 01/22/2022   BUN 16 01/22/2022   NA 136 01/22/2022   K 4.9 01/22/2022   CL 100 01/22/2022   CO2 20 01/22/2022   Lab Results  Component Value Date    ALT 13 12/27/2021   AST 17 12/27/2021   ALKPHOS 53 12/27/2021   BILITOT 0.3 12/27/2021   No results found for: "CHOL", "HDL", "LDLCALC", "LDLDIRECT", "  TRIG", "CHOLHDL"  No results found for: "HGBA1C"  Review of Prior Studies:  03/28/2022    1. Left ventricular ejection fraction, by estimation, is 55 to 60%. The left ventricle has normal function. The left ventricle has no regional wall motion abnormalities. There is mild asymmetric left ventricular hypertrophy of the basal segment. Left ventricular diastolic parameters are consistent with Grade II diastolic dysfunction (pseudonormalization). The average left ventricular global longitudinal strain is -19.8 %. The global longitudinal strain is normal.  2. Right ventricular systolic function is normal. The right ventricular size is normal. There is mildly elevated pulmonary artery systolic pressure. The estimated right ventricular systolic pressure is 94.8 mmHg.  3. Left atrial size was mildly dilated.  4. The mitral valve is grossly normal. Mild mitral valve regurgitation.  5. Tricuspid valve regurgitation is mild to moderate.  6. The aortic valve is tricuspid. Aortic valve regurgitation is not visualized. Aortic valve sclerosis is present, with no evidence of aortic valve stenosis.  7. The inferior vena cava is normal in size with greater than 50% respiratory variability, suggesting right atrial pressure of 3 mmHg.    02/2021 Echo Global longitudinal strain was attempted.  IMPRESSIONS     1. Left ventricular ejection fraction, by estimation, is 65 to 70%. The  left ventricle has normal function. The left ventricle has no regional  wall motion abnormalities. Left ventricular diastolic parameters are  indeterminate.   2. Right ventricular systolic function is normal. The right ventricular  size is normal. There is mildly elevated pulmonary artery systolic  pressure.   3. Left atrial size was mildly dilated.   4. The mitral  valve is normal in structure. Trivial mitral valve  regurgitation. No evidence of mitral stenosis.   5. The aortic valve is tricuspid. Aortic valve regurgitation is not  visualized. No aortic stenosis is present.   6. The inferior vena cava is normal in size with greater than 50%  respiratory variability, suggesting right atrial pressure of 3 mmHg.   Comparison(s): Previous Echo was technically challenging due to A-fib. LV  EF was '@50'$ %, with moderate MR and TR, and biatrial enlargement.     12/2021 echo    1. Left ventricular ejection fraction, by estimation, is 45 to 50%. The  left ventricle has mildly decreased function. The left ventricle has no  regional wall motion abnormalities. There is mild left ventricular  hypertrophy. Left ventricular diastolic  parameters are indeterminate.   2. Right ventricular systolic function is normal. The right ventricular  size is normal. There is mildly elevated pulmonary artery systolic  pressure.   3. Left atrial size was mildly dilated.   4. Right atrial size was mildly dilated.   5. The mitral valve is normal in structure. No evidence of mitral valve  regurgitation. No evidence of mitral stenosis.   6. The aortic valve is normal in structure. Aortic valve regurgitation is  not visualized. No aortic stenosis is present.   7. The inferior vena cava is normal in size with greater than 50%  respiratory variability, suggesting right atrial pressure of 3 mmHg.     05/2021 carotid US Summary:  Right Carotid: Velocities in the right ICA are consistent with a 1-39%  stenosis.   Left Carotid: Velocities in the left ICA are consistent with a 1-39%  stenosis.   Vertebrals:  Bilateral vertebral arteries demonstrate antegrade flow.  Subclavians: Normal flow hemodynamics were seen in bilateral subclavian  arteries.   Assessment & Plan   1.  Atrial fibrillation, continues on amiodarone and Eliquis.  This was noted during recent  hospitalization in the setting of sepsis (A-fib RVR) she is in normal sinus rhythm now with rate control on current medication regimen.  She denies any palpitations or heart racing.  I have reviewed her recent labs from August TSH is within normal limits.  She is not found to be anemic.  Eliquis samples are provided.  CHA2DS2-VASc score of 5.  She will continue metoprolol 50 mg XL daily.  Follow-up with Dr. Harl Bowie in 6 months.  2.  Hypertension: BP is well controlled today.  She denies any elevations in blood pressure at home.  I will continue her on losartan 50 mg 1 tablet in the morning and 1 tablet in the evening and amlodipine 5 mg twice daily as well.  Follow-up labs in 6 months if not completed by primary care.  3. Hyperlipidemia: Currently on Zetia 10 mg daily only.  Not on statin, due to myalgias.  Will need follow-up labs on next office appointment unless completed by PCP.  4.  GERD, on pantoprazole followed by primary care.  5.  Chronic lower extremity edema: Uses Lasix and potassium as needed.  Reviewed echocardiogram results with normal LV function with grade 2 diastolic dysfunction.  Refills are provided on Lasix and potassium   Current medicines are reviewed at length with the patient today.  I have spent 25 min's  dedicated to the care of this patient on the date of this encounter to include pre-visit review of records, assessment, management and diagnostic testing,with shared decision making.   Signed, Phill Myron. West Pugh, ANP, [place]   05/03/2022 12:57 PM      Office [phone] Fax [phone]  Notice: This dictation was prepared with Dragon dictation along with smaller phrase technology. Any transcriptional errors that result from this process are unintentional and may not be corrected upon review.

## 2022-05-03 ENCOUNTER — Ambulatory Visit: Payer: Medicare Other | Attending: Cardiology | Admitting: Adult Health

## 2022-05-03 ENCOUNTER — Encounter: Payer: Self-pay | Admitting: Adult Health

## 2022-05-03 VITALS — BP 124/76 | HR 63 | Ht 67.0 in | Wt 153.6 lb

## 2022-05-03 DIAGNOSIS — I480 Paroxysmal atrial fibrillation: Secondary | ICD-10-CM | POA: Diagnosis not present

## 2022-05-03 MED ORDER — KETOROLAC TROMETHAMINE 30 MG/ML IJ SOLN
30.0000 mg | INTRAMUSCULAR | Status: AC | PRN
  Administered 2022-04-30: 30 mg via INTRA_ARTICULAR

## 2022-05-03 MED ORDER — POTASSIUM CHLORIDE CRYS ER 20 MEQ PO TBCR: 20.0000 meq | EXTENDED_RELEASE_TABLET | ORAL | 2 refills | Status: DC | PRN

## 2022-05-03 MED ORDER — APIXABAN 5 MG PO TABS: 5.0000 mg | ORAL_TABLET | Freq: Two times a day (BID) | ORAL | 0 refills | Status: DC

## 2022-05-03 MED ORDER — FUROSEMIDE 20 MG PO TABS: 20.0000 mg | ORAL_TABLET | ORAL | 2 refills | Status: DC | PRN

## 2022-05-03 MED ORDER — TRIAMCINOLONE ACETONIDE 40 MG/ML IJ SUSP
40.0000 mg | INTRAMUSCULAR | Status: AC | PRN
  Administered 2022-04-30: 40 mg via INTRAMUSCULAR

## 2022-05-03 MED ORDER — LIDOCAINE HCL 1 % IJ SOLN
5.0000 mL | INTRAMUSCULAR | Status: AC | PRN
  Administered 2022-04-30: 5 mL

## 2022-05-03 NOTE — Patient Instructions (Addendum)
Medication Instructions:  No Changes *If you need a refill on your cardiac medications before your next appointment, please call your pharmacy*   Lab Work: No labs If you have labs (blood work) drawn today and your tests are completely normal, you will receive your results only by: [place] (if you have MyChart) OR A paper copy in the mail If you have any lab test that is abnormal or we need to change your treatment, we will call you to review the results.   Testing/Procedures: No Testing   Follow-Up: At [facility], you and your health needs are our priority.  As part of our continuing mission to provide you with exceptional heart care, we have created designated Provider Care Teams.  These Care Teams include your primary Cardiologist (physician) and Advanced Practice Providers (APPs -  Physician Assistants and Nurse Practitioners) who all work together to provide you with the care you need, when you need it.  We recommend signing up for the patient portal called "MyChart".  Sign up information is provided on this After Visit Summary.  MyChart is used to connect with patients for Virtual Visits (Telemedicine).  Patients are able to view lab/test results, encounter notes, upcoming appointments, etc.  Non-urgent messages can be sent to your provider as well.   To learn more about what you can do with MyChart, go to [email or web address].    Your next appointment:   6 month(s)  The format for your next appointment:   In Person  Provider:   [name], MD

## 2022-05-04 ENCOUNTER — Other Ambulatory Visit: Payer: Self-pay | Admitting: Adult Health

## 2022-05-06 ENCOUNTER — Telehealth: Payer: Self-pay | Admitting: Physical Medicine and Rehabilitation

## 2022-05-06 NOTE — Telephone Encounter (Signed)
See previous encounter

## 2022-05-06 NOTE — Telephone Encounter (Signed)
Spoke with patient and scheduled ov for 05/07/22

## 2022-05-06 NOTE — Telephone Encounter (Signed)
Patient called in stating she would like an injection in her back it has not gotten better.please advise

## 2022-05-06 NOTE — Telephone Encounter (Signed)
Patient returned call asked for a call back to schedule an appointment with Dr. Ernestina Patches. The number to contact patient is     [phone]

## 2022-05-06 NOTE — Telephone Encounter (Signed)
Patient states she is having pain in left lower back where she was having pain before. She is requesting a back injection. She states it was discussed in her visit last week with Barnet Pall note

## 2022-05-07 ENCOUNTER — Ambulatory Visit (INDEPENDENT_AMBULATORY_CARE_PROVIDER_SITE_OTHER): Payer: Medicare Other

## 2022-05-07 ENCOUNTER — Encounter: Payer: Self-pay | Admitting: Physical Medicine and Rehabilitation

## 2022-05-07 ENCOUNTER — Ambulatory Visit (INDEPENDENT_AMBULATORY_CARE_PROVIDER_SITE_OTHER): Payer: Medicare Other | Admitting: Physical Medicine and Rehabilitation

## 2022-05-07 DIAGNOSIS — M542 Cervicalgia: Secondary | ICD-10-CM

## 2022-05-07 DIAGNOSIS — M5416 Radiculopathy, lumbar region: Secondary | ICD-10-CM

## 2022-05-07 DIAGNOSIS — M4726 Other spondylosis with radiculopathy, lumbar region: Secondary | ICD-10-CM | POA: Diagnosis not present

## 2022-05-07 DIAGNOSIS — R269 Unspecified abnormalities of gait and mobility: Secondary | ICD-10-CM | POA: Diagnosis not present

## 2022-05-07 DIAGNOSIS — M47816 Spondylosis without myelopathy or radiculopathy, lumbar region: Secondary | ICD-10-CM

## 2022-05-07 DIAGNOSIS — M7918 Myalgia, other site: Secondary | ICD-10-CM

## 2022-05-07 NOTE — Progress Notes (Unsigned)
Lydia Graham - 86 y.o. female MRN [id number]  Date of birth: 06-30-1935  Office Visit Note: Visit Date: 05/07/2022 PCP: Alvira Monday, FNP Referred by: Alvira Monday, FNP  Subjective: Chief Complaint  Patient presents with   Lower Back - Pain   HPI: Lydia Graham is a 86 y.o. female who comes in today for evaluation of chronic, worsening and severe left lower back pain radiating to left buttock down left posterior thigh to knee. Pain ongoing for several years, worsened over the last couple of months. Pain exacerbated by movement and activity, describes as sore and aching, currently rates as 7 out of 10. Some relief of pain with home exercise regimen, rest and use of medications. Lumbar MRI imaging from 2015 exhibits levo convex scoliosis, multi level facet hypertrophy, moderate right foraminal stenosis and mild right lateral recess stenosis due to disc uncovering at level of L3-L4. No high grade spinal canal stenosis. Patient reports history of lumbar surgery at the age of 75 in [place], [place]. Patient underwent right L4-L5 interlaminar epidural steroid injection in our office in 2021, she reports significant and sustained relief of pain with this procedure. Patient currently using cane to assist with ambulation, states her severe pain is interfering with daily life. Patient is requesting a "quick fix" for her pain today. Patient denies focal weakness, numbness and tingling. Patient denies recent trauma or falls.   Patient recently underwent cervical myofascial trigger point injections in our office. She reports good relief of pain with this procedure.    Review of Systems  Musculoskeletal:  Positive for back pain.  Neurological:  Negative for tingling, sensory change, focal weakness and weakness.  All other systems reviewed and are negative.  Otherwise per HPI.  Assessment & Plan: Visit Diagnoses:    ICD-10-CM   1. Lumbar radiculopathy  M54.16 XR Lumbar Spine 2-3 Views     MR LUMBAR SPINE WO CONTRAST    2. Other spondylosis with radiculopathy, lumbar region  M47.26 MR LUMBAR SPINE WO CONTRAST    3. Facet arthropathy, lumbar  M47.816 MR LUMBAR SPINE WO CONTRAST    4. Gait abnormality  R26.9 MR LUMBAR SPINE WO CONTRAST       Plan: Findings:  1. Chronic, worsening and severe left lower back pain radiating to left buttock down left posterior thigh to knee. Patient continues to have severe pain despite good conservative therapies such as home exercise regimen, rest and use of medications. Patients clinical presentation and exam are consistent with S1 nerve pattern. AP and lateral x-rays obtained in the office today exhibit levoscoliosis, degenerative facet changes to lower lumbar spine, disc height loss on the right at L3-L4, left at L4-L5. Lumbar MRI imaging is from 2015, we feel next step is to obtain Lydia lumbar MRI imaging. We will have her follow up for lumbar MRI review. Good relief of pain with previous lumbar epidural steroid injection in 2021. I did discuss possibility of performing injection depending on lumbar MRI results. No red flag symptoms noted upon exam today.   2. If cervical myofascial pain returns we can repeat trigger point injections, could also consider formal physical therapy and dry needling. I encouraged patient to remain active as tolerated. Can continue with topical pain creams and use of hand held massager at home as needed.     Meds & Orders: No orders of the defined types were placed in this encounter.   Orders Placed This Encounter  Procedures   XR Lumbar Spine 2-3 Views  MR LUMBAR SPINE WO CONTRAST    Follow-up: Return for lumbar MRI review.   Procedures: No procedures performed      Clinical History: EXAM:  MRI LUMBAR SPINE WITHOUT CONTRAST   TECHNIQUE:  Multiplanar, multisequence MR imaging of the lumbar spine was  performed. No intravenous contrast was administered.   COMPARISON:  Report from 05/05/2006   FINDINGS:   There is 13 degrees of levoconvex lumbar scoliosis between L5 and  L2. Lipoma of the a left lateral abdominal wall musculature  incidentally noted.   The lowest lumbar type non-rib-bearing vertebra is labeled as L5.  The conus medullaris appears normal. Conus level: L1-2. Suspected  postoperative findings on the right at L3-4.   There is disc desiccation throughout the lumbar spine. Type 1  degenerative endplate findings at T0-2 with type 2 degenerative  endplate findings at I0-9.   There is 3 mm of grade 1 anterolisthesis at L3-4 and 3 mm  retrolisthesis at L4-5.   The leftward scoliosis has a significant rotary component.   Small central disc protrusion at T12-L1 without impingement.  Additional findings at individual levels are as follows:   L1-2: No impingement. Left lateral recess and inferior foraminal  disc protrusion.   L2-3: Mild displacement of the left L2 nerve in the lateral  extraforaminal space due to left lateral extraforaminal disc  protrusion.   L3-4: Moderate right foraminal stenosis and mild right subarticular  lateral recess stenosis due to disc uncovering, right lateral recess  and foraminal disc protrusion, and facet arthropathy. There is also  a left paracentral disc protrusion.   L4-5: Mild left foraminal stenosis along with mild left and  borderline right subarticular lateral recess stenosis due to disc  bulge and facet and intervertebral spurring.   L5-S1: No impingement. Small bilateral synovial cysts are best  observed on the axial images.   IMPRESSION:  1. Lumbar spondylosis and degenerative disc disease, causing  moderate impingement at L3-4 and mild impingement at L2-3 and L4-5,  as detailed above.  2. Levoconvex lumbar scoliosis with rotary component.    Electronically Signed    By: Sherryl Barters M.D.    On: 11/02/2013 11:29   She reports that she has never smoked. She has never used smokeless tobacco. No results for input(s):  "HGBA1C", "LABURIC" in the last 8760 hours.  Objective:  VS:  HT:    WT:   BMI:     BP:   HR: bpm  TEMP: ( )  RESP:  Physical Exam HENT:     Head: Normocephalic and atraumatic.     Right Ear: External ear normal.     Left Ear: External ear normal.     Nose: Nose normal.     Mouth/Throat:     Mouth: Mucous membranes are moist.  Eyes:     Pupils: Pupils are equal, round, and reactive to light.  Cardiovascular:     Rate and Rhythm: Normal rate.     Pulses: Normal pulses.  Pulmonary:     Effort: Pulmonary effort is normal.  Abdominal:     General: Abdomen is flat. There is no distension.  Musculoskeletal:        General: Tenderness present.     Cervical back: Normal range of motion.     Comments: Pt is slow to rise from seated position to standing. Good lumbar range of motion. Strong distal strength without clonus, no pain upon palpation of greater trochanters. Sensation intact bilaterally. Dysesthesias noted to left S1  dermatome. Ambulates with cane, gait unsteady.  Skin:    General: Skin is warm and dry.  Neurological:     Mental Status: She is alert and oriented to person, place, and time.     Gait: Gait abnormal.     Ortho Exam  Imaging: No results found.  Past Medical/Family/Surgical/Social History: Medications & Allergies reviewed per EMR, L[place] medications updated. Patient Active Problem List   Diagnosis Date Noted   Cellulitis of right forearm 01/22/2022   Sepsis secondary to UTI ([place]) 12/26/2021   Acute pyelonephritis 24/58/0998   Acute metabolic encephalopathy 33/82/5053   Sepsis ([place]) 12/25/2021   AKI (acute kidney injury) ([place]) 12/25/2021   Hyponatremia 12/25/2021   Dehydration 12/25/2021   Atrial fibrillation, chronic ([place]) 12/25/2021   Essential hypertension 12/25/2021   GERD (gastroesophageal reflux disease) 12/25/2021   Acquired trigger finger of left middle finger 08/04/2020   Arthritis of hand 08/04/2020   Osteoarthritis of metacarpophalangeal  (MCP) joint 08/03/2020   Bilateral hand pain 06/22/2020   Osteoarthritis of hands, 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   Osteoarthritis of feet, 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   PAF (paroxysmal atrial fibrillation) ([facility])    Chronic cough 12/29/2017   Atrial fibrillation with rapid ventricular response ([place]) 12/23/2017   Acute on chronic combined systolic and diastolic CHF (congestive heart failure) ([place])    Menopausal symptom 10/17/2017   Menopausal syndrome 10/17/2017   Carotid stenosis 03/27/2017   S/P total knee arthroplasty, right 10/23/2016   Unilateral primary osteoarthritis, right knee 07/13/2016   Past Medical History:  Diagnosis Date   Arthritis    oa, all over - multiple areas    Carotid artery occlusion    Dysrhythmia    AFib    GERD (gastroesophageal reflux disease)    Headache    h/o migraines    Hypertension    PAF (paroxysmal atrial fibrillation) ([place])    a. multiple DCCV's in 2019 and eventually required initiation of Amiodarone with successful DCCV after this. b. recurrent in 12/2021 while admitted for Urosepsis   Family History  Problem Relation Age of Onset   Stroke Mother    Rheum arthritis Sister    Past Surgical History:  Procedure Laterality Date   ABDOMINAL HYSTERECTOMY     APPENDECTOMY     BACK SURGERY     CARDIOVERSION N/A 12/19/2017   Procedure: CARDIOVERSION;  Surgeon: Arnoldo Lenis, MD;  [facility];  Service: Endoscopy;  Laterality: N/A;   CARDIOVERSION N/A 12/26/2017   Procedure: CARDIOVERSION;  Surgeon: [name], MD;  Location: [facility];  Service: Cardiovascular;  Laterality: N/A;   CARDIOVERSION N/A 01/26/2018   Procedure: CARDIOVERSION;  Surgeon: Jerline Pain, MD;  Location: [facility] ENDOSCOPY;  Service: Cardiovascular;  Laterality: N/A;   CAROTID ENDARTERECTOMY     CATARACT EXTRACTION W/PHACO Right 03/09/2021   Procedure: CATARACT EXTRACTION PHACO AND INTRAOCULAR LENS PLACEMENT with Placement of Corticosteroid ([place]);  Surgeon: [name],  MD;  Location: [facility];  Service: Ophthalmology;  Laterality: Right;  CDE 9.16   CATARACT EXTRACTION W/PHACO Left 04/02/2021   Procedure: CATARACT EXTRACTION PHACO AND INTRAOCULAR LENS PLACEMENT LEFT EYE;  Surgeon: [name], MD;  Location: [facility];  Service: Ophthalmology;  Laterality: Left;  left CDE=6.75   ENDARTERECTOMY Left 03/27/2017   Procedure: ENDARTERECTOMY CAROTID LEFT;  Surgeon: [name], MD;  Location: [facility];  Service: Vascular;  Laterality: Left;   INNER EAR SURGERY Right    puntured eardrum- repaired 2x's    KNEE ARTHROPLASTY  NASAL SINUS SURGERY     deviated septum   PATCH ANGIOPLASTY Left 03/27/2017   Procedure: PATCH ANGIOPLASTY LEFT CAROTID ARTERY USING Rueben Bash BIOLOGIC PATCH;  Surgeon: [name], MD;  Location: [facility];  Service: Vascular;  Laterality: Left;   TONSILLECTOMY     TOTAL KNEE ARTHROPLASTY Right 10/23/2016   Procedure: RIGHT TOTAL KNEE ARTHROPLASTY;  Surgeon: [name], MD;  Location: [facility];  Service: Orthopedics;  Laterality: Right;   Social History   Occupational History   Not on file  Tobacco Use   Smoking status: Never   Smokeless tobacco: Never  Vaping Use   Vaping Use: Never used  Substance and Sexual Activity   Alcohol use: No   Drug use: No   Sexual activity: Not Currently    Birth control/protection: Surgical

## 2022-05-07 NOTE — Progress Notes (Unsigned)
Numeric Pain Rating Scale and Functional Assessment Average Pain 7   In the last MONTH (on 0-10 scale) has pain interfered with the following?  1. General activity like being  able to carry out your everyday physical activities such as walking, climbing stairs, carrying groceries, or moving a chair?  Rating(7)   Low back pain that radiates down the back of the leg. Started last Thursday evening

## 2022-05-08 ENCOUNTER — Encounter: Payer: Self-pay | Admitting: Family Medicine

## 2022-05-08 ENCOUNTER — Other Ambulatory Visit: Payer: Self-pay | Admitting: Adult Health

## 2022-05-08 ENCOUNTER — Ambulatory Visit (INDEPENDENT_AMBULATORY_CARE_PROVIDER_SITE_OTHER): Payer: Medicare Other | Admitting: Family Medicine

## 2022-05-08 VITALS — BP 122/78 | HR 65 | Ht 67.0 in | Wt 154.0 lb

## 2022-05-08 DIAGNOSIS — I10 Essential (primary) hypertension: Secondary | ICD-10-CM

## 2022-05-08 DIAGNOSIS — I482 Chronic atrial fibrillation, unspecified: Secondary | ICD-10-CM | POA: Diagnosis not present

## 2022-05-08 DIAGNOSIS — K219 Gastro-esophageal reflux disease without esophagitis: Secondary | ICD-10-CM

## 2022-05-08 DIAGNOSIS — E7849 Other hyperlipidemia: Secondary | ICD-10-CM

## 2022-05-08 DIAGNOSIS — E038 Other specified hypothyroidism: Secondary | ICD-10-CM | POA: Diagnosis not present

## 2022-05-08 DIAGNOSIS — R7301 Impaired fasting glucose: Secondary | ICD-10-CM

## 2022-05-08 DIAGNOSIS — M5416 Radiculopathy, lumbar region: Secondary | ICD-10-CM | POA: Insufficient documentation

## 2022-05-08 DIAGNOSIS — E785 Hyperlipidemia, unspecified: Secondary | ICD-10-CM | POA: Insufficient documentation

## 2022-05-08 DIAGNOSIS — E559 Vitamin D deficiency, unspecified: Secondary | ICD-10-CM | POA: Diagnosis not present

## 2022-05-08 NOTE — Assessment & Plan Note (Signed)
Currently on Zetia 10 mg daily only.  Not on statin, due to myalgias Will assess her lipid profile today Encouraged to continue taking ezetimibe 10 mg daily

## 2022-05-08 NOTE — Patient Instructions (Addendum)
I appreciate the opportunity to provide care to you today!    Follow up:  4 months  Labs: please stop by the lab during the week  to get your blood drawn (CBC, CMP, TSH, Lipid profile, HgA1c, Vit D)  Conservative managements for Lumbar  Pain include Heat therapy --  helps reduce muscle spasm Cold Therapy- helps reduce edema Take OTC: tylenol        Please continue to a heart-healthy diet and increase your physical activities. Try to exercise for 42mns at least three times a week.      It was a pleasure to see you and I look forward to continuing to work together on your health and well-being. Please do not hesitate to call the office if you need care or have questions about your care.   Have a wonderful day and week. With Gratitude, GAlvira MondayMSN, FNP-BC

## 2022-05-08 NOTE — Assessment & Plan Note (Signed)
She is prescribed pantoprazole 40 mg twice daily, she reports only taking pantoprazole 40 mg once daily Encouraged to continue taking Protonix 40 mg daily

## 2022-05-08 NOTE — Assessment & Plan Note (Addendum)
Controlled Encouraged to continue taking  losartan 50 mg 1 tablet in the morning and 1 tablet in the evening and amlodipine 5 mg twice daily BP Readings from Last 3 Encounters:  05/08/22 122/78  05/03/22 124/76  01/22/22 138/72

## 2022-05-08 NOTE — Assessment & Plan Note (Signed)
She takes metoprolol 50 mg XL daily, amiodarone 100 mg every other day and  Eliquis 5 mg daily  She follows up with Dr. Harl Bowie in 6 months

## 2022-05-08 NOTE — Assessment & Plan Note (Signed)
She denies pain in the clinic but notes that the pain is unbearable when she is hurting and is aggravated by movement She had x-rays in the office when she saw orthopedics on 05/08/2022 and is scheduled for an MRI of the lumbar spine Encourage patient not to take Advil, given that she is on Eliquis 5 mg twice daily Encourage conservative management with Tylenol and heat application for lower back

## 2022-05-08 NOTE — Progress Notes (Signed)
Established Patient Office Visit  Subjective:  Patient ID: Lydia Graham, female    DOB: 1935-09-22  Age: 86 y.o. MRN: [id number]  CC:  Chief Complaint  Patient presents with   Follow-up    3 month f/u, pt reports back pain today.    HPI Lydia Graham is a 86 y.o. female with past medical history of arthritis, GERD, hyperlipidemia, hypertension presents for f/u of  chronic medical conditions.  Essential hypertension: She takes losartan 50 mg 1 tablet in the morning and 1 tablet in the evening and amlodipine 5 mg twice daily.  She denies headaches, dizziness, blurred vision and chest pain.  A-fib: She takes metoprolol 50 mg XL daily, amiodarone 100 mg every other day and  Eliquis 5 mg daily. She follows up with Dr. Harl Bowie in 6 months.   Hyperlipidemia:Currently on Zetia 10 mg daily only.  Not on statin, due to myalgias.   Chronic lower extremity edema:Uses Lasix and potassium as needed.   GERD: She takes pantoprazole 40 mg twice daily, she reports only taking pantoprazole 40 mg once daily.  Lumbar rediculopathy: She followed up with orthopedics on 05/08/2022 for worsening of her chronic lumbar pain that radiates to her left buttocks and down her left posterior thigh to the knee.  She denies pain in the clinic but notes that the pain is unbearable and is aggravated with movement.  She rates pain 7 out of 10 and describes the pain as a sore, achy, and burning sensation. She had x-rays in the office when she saw orthopedics on 05/08/2022 and is scheduled for an MRI of the lumbar spine.  She reports getting steroid injections in her lumbar spine to relieve her symptoms.  She has been taking Advil for symptomatic management.  She denies bowel and bladder incontinence, lower extremity weakness, and dysuria.    Past Medical History:  Diagnosis Date   Arthritis    oa, all over - multiple areas    Carotid artery occlusion    Dysrhythmia    AFib    GERD (gastroesophageal reflux  disease)    Headache    h/o migraines    Hypertension    PAF (paroxysmal atrial fibrillation) ([place])    a. multiple DCCV's in 2019 and eventually required initiation of Amiodarone with successful DCCV after this. b. recurrent in 12/2021 while admitted for Urosepsis    Past Surgical History:  Procedure Laterality Date   ABDOMINAL HYSTERECTOMY     APPENDECTOMY     BACK SURGERY     CARDIOVERSION N/A 12/19/2017   Procedure: CARDIOVERSION;  Surgeon: Arnoldo Lenis, MD;  [facility];  Service: Endoscopy;  Laterality: N/A;   CARDIOVERSION N/A 12/26/2017   Procedure: CARDIOVERSION;  Surgeon: [name], MD;  Location: [place];  Service: Cardiovascular;  Laterality: N/A;   CARDIOVERSION N/A 01/26/2018   Procedure: CARDIOVERSION;  Surgeon: Jerline Pain, MD;  Location: [facility] ENDOSCOPY;  Service: Cardiovascular;  Laterality: N/A;   CAROTID ENDARTERECTOMY     CATARACT EXTRACTION W/PHACO Right 03/09/2021   Procedure: CATARACT EXTRACTION PHACO AND INTRAOCULAR LENS PLACEMENT with Placement of Corticosteroid ([place]);  Surgeon: [name], MD;  Location: [facility];  Service: Ophthalmology;  Laterality: Right;  CDE 9.16   CATARACT EXTRACTION W/PHACO Left 04/02/2021   Procedure: CATARACT EXTRACTION PHACO AND INTRAOCULAR LENS PLACEMENT LEFT EYE;  Surgeon: [name], MD;  Location: [facility];  Service: Ophthalmology;  Laterality: Left;  left CDE=6.75   ENDARTERECTOMY Left 03/27/2017   Procedure:  ENDARTERECTOMY CAROTID LEFT;  Surgeon: [name], MD;  Location: [place];  Service: Vascular;  Laterality: Left;   INNER EAR SURGERY Right    puntured eardrum- repaired 2x's    KNEE ARTHROPLASTY     NASAL SINUS SURGERY     deviated septum   PATCH ANGIOPLASTY Left 03/27/2017   Procedure: PATCH ANGIOPLASTY LEFT CAROTID ARTERY USING Rueben Bash BIOLOGIC PATCH;  Surgeon: [name], MD;  Location: [place];  Service: Vascular;  Laterality: Left;   TONSILLECTOMY     TOTAL KNEE  ARTHROPLASTY Right 10/23/2016   Procedure: RIGHT TOTAL KNEE ARTHROPLASTY;  Surgeon: [name], MD;  Location: [place];  Service: Orthopedics;  Laterality: Right;    Family History  Problem Relation Age of Onset   Stroke Mother    Rheum arthritis Sister     Social History   Socioeconomic History   Marital status: Single    Spouse name: Not on file   Number of children: Not on file   Years of education: Not on file   Highest education level: Not on file  Occupational History   Not on file  Tobacco Use   Smoking status: Never   Smokeless tobacco: Never  Vaping Use   Vaping Use: Never used  Substance and Sexual Activity   Alcohol use: No   Drug use: No   Sexual activity: Not Currently    Birth control/protection: Surgical  Other Topics Concern   Not on file  Social History Narrative   Not on file   Social Determinants of Health   Financial Resource Strain: Not on file  Food Insecurity: Not on file  Transportation Needs: Not on file  Physical Activity: Not on file  Stress: Not on file  Social Connections: Not on file  Intimate Partner Violence: Not on file    Outpatient Medications Prior to Visit  Medication Sig Dispense Refill   amiodarone (PACERONE) 200 MG tablet Take 0.5 tablets (100 mg total) by mouth daily. Till 02/02/22, then decrease to every other day - 01/04/2022     amLODipine (NORVASC) 5 MG tablet TAKE 1 TABLET AT 8AM AND 1 TABLET AT 8PM. 60 tablet 3   apixaban (ELIQUIS) 5 MG TABS tablet TAKE (1) TABLET TWICE DAILY. 180 tablet 2   apixaban (ELIQUIS) 5 MG TABS tablet Take 1 tablet (5 mg total) by mouth 2 (two) times daily. 42 tablet 0   Calcium Carbonate-Vitamin D (CALCIUM 600+D PO) Take 1 tablet by mouth daily.     Camphor-Menthol-Methyl Sal (SALONPAS) 3.06-29-08 % PTCH Place 1 patch onto the skin at bedtime as needed (foot pain).     estradiol (ESTRACE) 0.5 MG tablet Take 0.5 mg by mouth daily.      ezetimibe (ZETIA) 10 MG tablet Take 1 tablet (10 mg total)  by mouth daily. 30 tablet 0   fluticasone (FLONASE) 50 MCG/ACT nasal spray Place 2 sprays into both nostrils daily as needed for allergies.      furosemide (LASIX) 20 MG tablet Take 1 tablet (20 mg total) by mouth as needed for fluid or edema. 30 tablet 2   Hypromellose (ARTIFICIAL TEARS OP) Place 2 drops into both eyes 2 (two) times daily as needed (for dry eyes).     losartan (COZAAR) 50 MG tablet TAKE 1 TABLET AT 10 AM AND 1 TABLET AT 10 PM. 60 tablet 6   magnesium hydroxide (MILK OF MAGNESIA) 400 MG/5ML suspension Take 30 mLs by mouth daily as needed for mild constipation.  metoprolol succinate (TOPROL-XL) 50 MG 24 hr tablet TAKE 1 TABLET AT LUNCH OR IMMEDIATELY FOLLOWING A MEAL. 30 tablet 6   mupirocin ointment (BACTROBAN) 2 % Apply 1 Application topically daily. 22 g 0   pantoprazole (PROTONIX) 40 MG tablet TAKE (1) TABLET TWICE DAILY. 60 tablet 4   potassium chloride SA (KLOR-CON M) 20 MEQ tablet Take 1 tablet (20 mEq total) by mouth as needed (take on days you take your Furosemide). 30 tablet 2   vitamin B-12 (CYANOCOBALAMIN) 1000 MCG tablet Take 1,000 mcg by mouth daily.     No facility-administered medications prior to visit.    Allergies  Allergen Reactions   Rosuvastatin Other (See Comments)    MYALGIA, Muscle pain   Lisinopril Cough   Other Other (See Comments)    PAIN MEDICATIONS/ANESTHESIA  MADE BP DROP LOW    Singulair [Montelukast Sodium] Other (See Comments)    fatigue    ROS Review of Systems  Constitutional:  Negative for chills and fever.  Eyes:  Negative for visual disturbance.  Respiratory:  Negative for chest tightness and shortness of breath.   Cardiovascular:  Negative for chest pain and palpitations.  Musculoskeletal:  Positive for back pain.  Neurological:  Negative for dizziness and headaches.  Psychiatric/Behavioral:  Negative for self-injury and suicidal ideas.       Objective:    Physical Exam HENT:     Head: Normocephalic.   Cardiovascular:     Rate and Rhythm: Normal rate and regular rhythm.     Pulses: Normal pulses.     Heart sounds: Normal heart sounds.  Pulmonary:     Breath sounds: Normal breath sounds.  Musculoskeletal:     Comments: Mild pain with palpation of the lumbar musculature Negative CVA tenderness   Neurological:     Mental Status: She is alert.     BP 122/78   Pulse 65   Ht _0  (1.702 m)   Wt 154 lb (69.9 kg)   LMP  (LMP Unknown)   SpO2 98%   BMI 24.12 kg/m  Wt Readings from Last 3 Encounters:  05/08/22 154 lb (69.9 kg)  05/03/22 153 lb 9.6 oz (69.7 kg)  01/22/22 154 lb 1.9 oz (69.9 kg)    Lab Results  Component Value Date   TSH 3.064 01/02/2022   Lab Results  Component Value Date   WBC 7.0 01/22/2022   HGB 13.5 01/22/2022   HCT 41.1 01/22/2022   MCV 92 01/22/2022   PLT 368 01/22/2022   Lab Results  Component Value Date   NA 136 01/22/2022   K 4.9 01/22/2022   CO2 20 01/22/2022   GLUCOSE 96 01/22/2022   BUN 16 01/22/2022   CREATININE 1.17 (H) 01/22/2022   BILITOT 0.3 12/27/2021   ALKPHOS 53 12/27/2021   AST 17 12/27/2021   ALT 13 12/27/2021   PROT 5.6 (L) 12/27/2021   ALBUMIN 2.5 (L) 12/27/2021   CALCIUM 10.1 01/22/2022   ANIONGAP 11 01/02/2022   EGFR 46 (L) 01/22/2022   No results found for: "CHOL" No results found for: "HDL" No results found for: "LDLCALC" No results found for: "TRIG" No results found for: "CHOLHDL" No results found for: "HGBA1C"    Assessment & Plan:   Problem List Items Addressed This Visit       Cardiovascular and Mediastinum   Atrial fibrillation, chronic ([place])    She takes metoprolol 50 mg XL daily, amiodarone 100 mg every other day and  Eliquis 5 mg daily  She follows up with Dr. Harl Bowie in 6 months      Essential hypertension    Controlled Encouraged to continue taking  losartan 50 mg 1 tablet in the morning and 1 tablet in the evening and amlodipine 5 mg twice daily BP Readings from Last 3 Encounters:   05/08/22 122/78  05/03/22 124/76  01/22/22 138/72         Relevant Orders   CMP14+EGFR   CBC with Differential/Platelet     Digestive   GERD (gastroesophageal reflux disease)    She is prescribed pantoprazole 40 mg twice daily, she reports only taking pantoprazole 40 mg once daily Encouraged to continue taking Protonix 40 mg daily         Nervous and Auditory   Lumbar radiculopathy     She denies pain in the clinic but notes that the pain is unbearable when she is hurting and is aggravated by movement She had x-rays in the office when she saw orthopedics on 05/08/2022 and is scheduled for an MRI of the lumbar spine Encourage patient not to take Advil, given that she is on Eliquis 5 mg twice daily Encourage conservative management with Tylenol and heat application for lower back         Other   Hyperlipidemia    Currently on Zetia 10 mg daily only.  Not on statin, due to myalgias Will assess her lipid profile today Encouraged to continue taking ezetimibe 10 mg daily        Relevant Orders   Lipid panel   Other Visit Diagnoses     IFG (impaired fasting glucose)    -  Primary   Relevant Orders   Hemoglobin A1c   Vitamin D deficiency       Relevant Orders   VITAMIN D 25 Hydroxy (Vit-D Deficiency, Fractures)   Other specified hypothyroidism       Relevant Orders   TSH + free T4       No orders of the defined types were placed in this encounter.   Follow-up: Return in about 6 months (around 11/06/2022).    Alvira Monday, FNP

## 2022-05-15 ENCOUNTER — Encounter: Payer: Self-pay | Admitting: Physical Medicine and Rehabilitation

## 2022-05-15 ENCOUNTER — Ambulatory Visit
Admission: RE | Admit: 2022-05-15 | Discharge: 2022-05-15 | Disposition: A | Discharge status: Home / Self Care | Payer: Medicare Other | Source: Ambulatory Visit | Attending: Physical Medicine and Rehabilitation | Admitting: Physical Medicine and Rehabilitation

## 2022-05-15 DIAGNOSIS — M47816 Spondylosis without myelopathy or radiculopathy, lumbar region: Secondary | ICD-10-CM | POA: Diagnosis not present

## 2022-05-15 DIAGNOSIS — M545 Low back pain, unspecified: Secondary | ICD-10-CM | POA: Diagnosis not present

## 2022-05-15 DIAGNOSIS — M48061 Spinal stenosis, lumbar region without neurogenic claudication: Secondary | ICD-10-CM | POA: Diagnosis not present

## 2022-05-21 ENCOUNTER — Other Ambulatory Visit: Payer: Self-pay | Admitting: Physical Medicine and Rehabilitation

## 2022-05-21 DIAGNOSIS — R7301 Impaired fasting glucose: Secondary | ICD-10-CM | POA: Diagnosis not present

## 2022-05-21 DIAGNOSIS — I10 Essential (primary) hypertension: Secondary | ICD-10-CM | POA: Diagnosis not present

## 2022-05-21 DIAGNOSIS — E7849 Other hyperlipidemia: Secondary | ICD-10-CM | POA: Diagnosis not present

## 2022-05-21 DIAGNOSIS — E559 Vitamin D deficiency, unspecified: Secondary | ICD-10-CM | POA: Diagnosis not present

## 2022-05-21 DIAGNOSIS — M5416 Radiculopathy, lumbar region: Secondary | ICD-10-CM

## 2022-05-21 DIAGNOSIS — E038 Other specified hypothyroidism: Secondary | ICD-10-CM | POA: Diagnosis not present

## 2022-05-22 LAB — CBC WITH DIFFERENTIAL/PLATELET
Basophils Absolute: 0.1 10*3/uL (ref 0.0–0.2)
Basos: 1 %
EOS (ABSOLUTE): 0.7 10*3/uL — ABNORMAL HIGH (ref 0.0–0.4)
Eos: 8 %
Hematocrit: 41 % (ref 34.0–46.6)
Hemoglobin: 13.4 g/dL (ref 11.1–15.9)
Immature Grans (Abs): 0 10*3/uL (ref 0.0–0.1)
Immature Granulocytes: 0 %
Lymphocytes Absolute: 1.9 10*3/uL (ref 0.7–3.1)
Lymphs: 24 %
MCH: 30.9 pg (ref 26.6–33.0)
MCHC: 32.7 g/dL (ref 31.5–35.7)
MCV: 95 fL (ref 79–97)
Monocytes Absolute: 0.5 10*3/uL (ref 0.1–0.9)
Monocytes: 6 %
Neutrophils Absolute: 4.7 10*3/uL (ref 1.4–7.0)
Neutrophils: 61 %
Platelets: 370 10*3/uL (ref 150–450)
RBC: 4.34 x10E6/uL (ref 3.77–5.28)
RDW: 12.8 % (ref 11.7–15.4)
WBC: 8 10*3/uL (ref 3.4–10.8)

## 2022-05-22 LAB — CMP14+EGFR
ALT: 8 IU/L (ref 0–32)
AST: 18 IU/L (ref 0–40)
Albumin/Globulin Ratio: 1.7 (ref 1.2–2.2)
Albumin: 4.4 g/dL (ref 3.7–4.7)
Alkaline Phosphatase: 76 IU/L (ref 44–121)
BUN/Creatinine Ratio: 17 (ref 12–28)
BUN: 21 mg/dL (ref 8–27)
Bilirubin Total: 0.7 mg/dL (ref 0.0–1.2)
CO2: 19 mmol/L — ABNORMAL LOW (ref 20–29)
Calcium: 9.9 mg/dL (ref 8.7–10.3)
Chloride: 102 mmol/L (ref 96–106)
Creatinine, Ser: 1.22 mg/dL — ABNORMAL HIGH (ref 0.57–1.00)
Globulin, Total: 2.6 g/dL (ref 1.5–4.5)
Glucose: 89 mg/dL (ref 70–99)
Potassium: 4.4 mmol/L (ref 3.5–5.2)
Sodium: 141 mmol/L (ref 134–144)
Total Protein: 7 g/dL (ref 6.0–8.5)
eGFR: 44 mL/min/{1.73_m2} — ABNORMAL LOW (ref >59)

## 2022-05-22 LAB — LIPID PANEL
Chol/HDL Ratio: 4.1 ratio (ref 0.0–4.4)
Cholesterol, Total: 219 mg/dL — ABNORMAL HIGH (ref 100–199)
HDL: 54 mg/dL (ref >39)
LDL Chol Calc (NIH): 130 mg/dL — ABNORMAL HIGH (ref 0–99)
Triglycerides: 200 mg/dL — ABNORMAL HIGH (ref 0–149)
VLDL Cholesterol Cal: 35 mg/dL (ref 5–40)

## 2022-05-22 LAB — HEMOGLOBIN A1C
Est. average glucose Bld gHb Est-mCnc: 105 mg/dL
Hgb A1c MFr Bld: 5.3 % (ref 4.8–5.6)

## 2022-05-22 LAB — TSH+FREE T4
Free T4: 1.06 ng/dL (ref 0.82–1.77)
TSH: 3.19 u[IU]/mL (ref 0.450–4.500)

## 2022-05-22 LAB — VITAMIN D 25 HYDROXY (VIT D DEFICIENCY, FRACTURES): Vit D, 25-Hydroxy: 43.8 ng/mL (ref 30.0–100.0)

## 2022-05-24 ENCOUNTER — Telehealth: Payer: Self-pay

## 2022-05-24 NOTE — Telephone Encounter (Signed)
   Name: Lydia Graham  DOB: 03/18/1936  MRN: [id number]   Primary Cardiologist: [name], MD  Chart reviewed as part of pre-operative protocol coverage. Lydia Graham was last seen on 05/03/2022 by Jory Sims, NP. She was without cardiac complaints at that visit.   Therefore, based on ACC/AHA guidelines, the patient would be at acceptable risk for the planned procedure without further cardiovascular testing.   Per Pharm D: Procedure: epidural spinal injection Date of procedure: TBD   CHA2DS2-VASc Score = 6  This indicates a 9.7% annual risk of stroke. The patient's score is based upon: CHF History: 1 HTN History: 1 Diabetes History: 0 Stroke History: 0 Vascular Disease History: 1 Age Score: 2 Gender Score: 1   CrCl 5m/min Platelet count 370K   Per office protocol, patient can hold Eliquis for 3 days prior to procedure.    I will route this recommendation to the requesting party via Epic fax function and remove from pre-op pool. Please call with questions.  DMayra Reel NP 05/24/2022, 3:28 PM

## 2022-05-24 NOTE — Telephone Encounter (Signed)
Patient with diagnosis of afib on Eliquis for anticoagulation.    Procedure: epidural spinal injection Date of procedure: TBD  CHA2DS2-VASc Score = 6  This indicates a 9.7% annual risk of stroke. The patient's score is based upon: CHF History: 1 HTN History: 1 Diabetes History: 0 Stroke History: 0 Vascular Disease History: 1 Age Score: 2 Gender Score: 1  CrCl 15m/min Platelet count 370K  Per office protocol, patient can hold Eliquis for 3 days prior to procedure.    **This guidance is not considered finalized until pre-operative APP has relayed final recommendations.**

## 2022-05-24 NOTE — Telephone Encounter (Signed)
..     Pre-operative Risk Assessment    Patient Name: Lydia Graham  DOB: [birth year] MRN: [id number]      Request for Surgical Clearance    Procedure:   EPIDURAL STEROID INJECTION  Date of Surgery:  Clearance TBD                                 Surgeon:   DR Brandt Loosen Group or Practice Name:  [facility] Phone number:  [phone] Fax number:  [phone]   Type of Clearance Requested:   - Medical  - Pharmacy:  Hold Apixaban (Eliquis) NEED TO HOLD FOR 2 DAYS   Type of Anesthesia:  NOT  INDICATED   Additional requests/questions:    Gwenlyn Found   05/24/2022, 2:39 PM

## 2022-05-25 ENCOUNTER — Other Ambulatory Visit: Payer: Medicare Other

## 2022-05-27 ENCOUNTER — Other Ambulatory Visit: Payer: Self-pay | Admitting: Family Medicine

## 2022-05-27 DIAGNOSIS — E7849 Other hyperlipidemia: Secondary | ICD-10-CM

## 2022-05-27 NOTE — Progress Notes (Signed)
Please inform the patient that a referral has been placed to the lipid clinic.  Her cholesterol is elevated; I recommend that she continue taking ezetimibe 10 mg daily.  Labs indicate that she is dehydrated; I recommend increasing her fluid intake to 64 ounces daily.

## 2022-05-28 DIAGNOSIS — B079 Viral wart, unspecified: Secondary | ICD-10-CM | POA: Diagnosis not present

## 2022-05-28 DIAGNOSIS — L570 Actinic keratosis: Secondary | ICD-10-CM | POA: Diagnosis not present

## 2022-05-29 ENCOUNTER — Telehealth: Payer: Self-pay | Admitting: Physical Medicine and Rehabilitation

## 2022-05-29 NOTE — Telephone Encounter (Signed)
Spoke with patient and scheduled injection for 06/03/22

## 2022-05-29 NOTE — Telephone Encounter (Signed)
Patient called. Returning a call to schedule with Dr. Newton.  ?

## 2022-06-03 ENCOUNTER — Ambulatory Visit (INDEPENDENT_AMBULATORY_CARE_PROVIDER_SITE_OTHER): Payer: Medicare Other | Admitting: Physical Medicine and Rehabilitation

## 2022-06-03 ENCOUNTER — Ambulatory Visit: Payer: Self-pay

## 2022-06-03 VITALS — BP 153/70 | HR 68

## 2022-06-03 DIAGNOSIS — M5416 Radiculopathy, lumbar region: Secondary | ICD-10-CM

## 2022-06-03 MED ORDER — METHYLPREDNISOLONE ACETATE 80 MG/ML IJ SUSP
80.0000 mg | Freq: Once | INTRAMUSCULAR | Status: AC
  Administered 2022-06-03: 80 mg

## 2022-06-03 NOTE — Progress Notes (Signed)
Numeric Pain Rating Scale and Functional Assessment Average Pain 5   In the last MONTH (on 0-10 scale) has pain interfered with the following?  1. General activity like being  able to carry out your everyday physical activities such as walking, climbing stairs, carrying groceries, or moving a chair?  Rating(9)   +Driver, -BT, -Dye Allergies.  Lower back pain with radiation down the left leg. Sitting and changing positions makes pain worse

## 2022-06-03 NOTE — Patient Instructions (Signed)

## 2022-06-05 ENCOUNTER — Other Ambulatory Visit: Payer: Self-pay | Admitting: *Deleted

## 2022-06-05 DIAGNOSIS — I6523 Occlusion and stenosis of bilateral carotid arteries: Secondary | ICD-10-CM

## 2022-06-07 ENCOUNTER — Other Ambulatory Visit: Payer: Self-pay | Admitting: Internal Medicine

## 2022-06-07 DIAGNOSIS — I4891 Unspecified atrial fibrillation: Secondary | ICD-10-CM

## 2022-06-07 NOTE — Telephone Encounter (Signed)
Prescription refill request for Eliquis received. Indication: Afib  Last office visit: 05/03/22 Joline Salt)  Scr: 1.22 (05/21/22)  Age: 86 Weight: 69.9kg  Appropriate dose and refill sent to requested pharmacy.

## 2022-06-09 NOTE — Progress Notes (Signed)
Lydia Graham - 86 y.o. female MRN [id number]  Date of birth: 21-Oct-1935  Office Visit Note: Visit Date: 06/03/2022 PCP: Alvira Monday, FNP Referred by: Alvira Monday, FNP  Subjective: Chief Complaint  Patient presents with   Lower Back - Pain   HPI:  Lydia Graham is a 86 y.o. female who comes in today at the request of Barnet Pall, FNP for planned Left L4-5 Lumbar Interlaminar epidural steroid injection with fluoroscopic guidance.  The patient has failed conservative care including home exercise, medications, time and activity modification.  This injection will be diagnostic and hopefully therapeutic.  Please see requesting physician notes for further details and justification.   ROS Otherwise per HPI.  Assessment & Plan: Visit Diagnoses:    ICD-10-CM   1. Lumbar radiculopathy  M54.16 XR C-ARM NO REPORT    Epidural Steroid injection    methylPREDNISolone acetate (DEPO-MEDROL) injection 80 mg      Plan: No additional findings.   Meds & Orders:  Meds ordered this encounter  Medications   methylPREDNISolone acetate (DEPO-MEDROL) injection 80 mg    Orders Placed This Encounter  Procedures   XR C-ARM NO REPORT   Epidural Steroid injection    Follow-up: Return for visit to requesting provider as needed.   Procedures: No procedures performed  Lumbar Epidural Steroid Injection - Interlaminar Approach with Fluoroscopic Guidance  Patient: Lydia Graham      Date of Birth: 09/21/1935 MRN: [id number] PCP: Alvira Monday, FNP      Visit Date: 06/03/2022   Universal Protocol:     Consent Given By: the patient  Position: PRONE  Additional Comments: Vital signs were monitored before and after the procedure. Patient was prepped and draped in the usual sterile fashion. The correct patient, procedure, and site was verified.   Injection Procedure Details:   Procedure diagnoses: Lumbar radiculopathy [M54.16]   Meds Administered:  Meds ordered this  encounter  Medications   methylPREDNISolone acetate (DEPO-MEDROL) injection 80 mg     Laterality: Left  Location/Site:  L4-5  Needle: 3.5 in., 20 ga. Tuohy  Needle Placement: Paramedian epidural  Findings:   -Comments: Excellent flow of contrast into the epidural space.  Procedure Details: Using a paramedian approach from the side mentioned above, the region overlying the inferior lamina was localized under fluoroscopic visualization and the soft tissues overlying this structure were infiltrated with 4 ml. of 1% Lidocaine without Epinephrine. The Tuohy needle was inserted into the epidural space using a paramedian approach.   The epidural space was localized using loss of resistance along with counter oblique bi-planar fluoroscopic views.  After negative aspirate for air, blood, and CSF, a 2 ml. volume of Isovue-250 was injected into the epidural space and the flow of contrast was observed. Radiographs were obtained for documentation purposes.    The injectate was administered into the level noted above.   Additional Comments:  No complications occurred Dressing: 2 x 2 sterile gauze and Band-Aid    Post-procedure details: Patient was observed during the procedure. Post-procedure instructions were reviewed.  Patient left the clinic in stable condition.   Clinical History: EXAM:  MRI LUMBAR SPINE WITHOUT CONTRAST   TECHNIQUE:  Multiplanar, multisequence MR imaging of the lumbar spine was  performed. No intravenous contrast was administered.   COMPARISON:  Report from 05/05/2006   FINDINGS:  There is 13 degrees of levoconvex lumbar scoliosis between L5 and  L2. Lipoma of the a left lateral abdominal wall musculature  incidentally noted.  The lowest lumbar type non-rib-bearing vertebra is labeled as L5.  The conus medullaris appears normal. Conus level: L1-2. Suspected  postoperative findings on the right at L3-4.   There is disc desiccation throughout the lumbar  spine. Type 1  degenerative endplate findings at J1-8 with type 2 degenerative  endplate findings at A4-1.   There is 3 mm of grade 1 anterolisthesis at L3-4 and 3 mm  retrolisthesis at L4-5.   The leftward scoliosis has a significant rotary component.   Small central disc protrusion at T12-L1 without impingement.  Additional findings at individual levels are as follows:   L1-2: No impingement. Left lateral recess and inferior foraminal  disc protrusion.   L2-3: Mild displacement of the left L2 nerve in the lateral  extraforaminal space due to left lateral extraforaminal disc  protrusion.   L3-4: Moderate right foraminal stenosis and mild right subarticular  lateral recess stenosis due to disc uncovering, right lateral recess  and foraminal disc protrusion, and facet arthropathy. There is also  a left paracentral disc protrusion.   L4-5: Mild left foraminal stenosis along with mild left and  borderline right subarticular lateral recess stenosis due to disc  bulge and facet and intervertebral spurring.   L5-S1: No impingement. Small bilateral synovial cysts are best  observed on the axial images.   IMPRESSION:  1. Lumbar spondylosis and degenerative disc disease, causing  moderate impingement at L3-4 and mild impingement at L2-3 and L4-5,  as detailed above.  2. Levoconvex lumbar scoliosis with rotary component.    Electronically Signed    By: Sherryl Barters M.D.    On: 11/02/2013 11:29     Objective:  VS:  HT:    WT:   BMI:     BP:(!) 153/70  HR:68bpm  TEMP: ( )  RESP:  Physical Exam Vitals and nursing note reviewed.  Constitutional:      General: She is not in acute distress.    Appearance: Normal appearance. She is not ill-appearing.  HENT:     Head: Normocephalic and atraumatic.     Right Ear: External ear normal.     Left Ear: External ear normal.  Eyes:     Extraocular Movements: Extraocular movements intact.  Cardiovascular:     Rate and Rhythm:  Normal rate.     Pulses: Normal pulses.  Pulmonary:     Effort: Pulmonary effort is normal. No respiratory distress.  Abdominal:     General: There is no distension.     Palpations: Abdomen is soft.  Musculoskeletal:        General: Tenderness present.     Cervical back: Neck supple.     Right lower leg: No edema.     Left lower leg: No edema.     Comments: Patient has good distal strength with no pain over the greater trochanters.  No clonus or focal weakness.  Skin:    Findings: No erythema, lesion or rash.  Neurological:     General: No focal deficit present.     Mental Status: She is alert and oriented to person, place, and time.     Sensory: No sensory deficit.     Motor: No weakness or abnormal muscle tone.     Coordination: Coordination normal.  Psychiatric:        Mood and Affect: Mood normal.        Behavior: Behavior normal.      Imaging: No results found.

## 2022-06-09 NOTE — Procedures (Signed)
Lumbar Epidural Steroid Injection - Interlaminar Approach with Fluoroscopic Guidance  Patient: Lydia Graham      Date of Birth: [birth year] MRN: [id number] PCP: Alvira Monday, FNP      Visit Date: 06/03/2022   Universal Protocol:     Consent Given By: the patient  Position: PRONE  Additional Comments: Vital signs were monitored before and after the procedure. Patient was prepped and draped in the usual sterile fashion. The correct patient, procedure, and site was verified.   Injection Procedure Details:   Procedure diagnoses: Lumbar radiculopathy [M54.16]   Meds Administered:  Meds ordered this encounter  Medications   methylPREDNISolone acetate (DEPO-MEDROL) injection 80 mg     Laterality: Left  Location/Site:  L4-5  Needle: 3.5 in., 20 ga. Tuohy  Needle Placement: Paramedian epidural  Findings:   -Comments: Excellent flow of contrast into the epidural space.  Procedure Details: Using a paramedian approach from the side mentioned above, the region overlying the inferior lamina was localized under fluoroscopic visualization and the soft tissues overlying this structure were infiltrated with 4 ml. of 1% Lidocaine without Epinephrine. The Tuohy needle was inserted into the epidural space using a paramedian approach.   The epidural space was localized using loss of resistance along with counter oblique bi-planar fluoroscopic views.  After negative aspirate for air, blood, and CSF, a 2 ml. volume of Isovue-250 was injected into the epidural space and the flow of contrast was observed. Radiographs were obtained for documentation purposes.    The injectate was administered into the level noted above.   Additional Comments:  No complications occurred Dressing: 2 x 2 sterile gauze and Band-Aid    Post-procedure details: Patient was observed during the procedure. Post-procedure instructions were reviewed.  Patient left the clinic in stable condition.

## 2022-06-12 ENCOUNTER — Other Ambulatory Visit: Payer: Self-pay | Admitting: Cardiology

## 2022-06-13 ENCOUNTER — Ambulatory Visit (INDEPENDENT_AMBULATORY_CARE_PROVIDER_SITE_OTHER): Payer: Medicare Other | Admitting: Physician Assistant

## 2022-06-13 ENCOUNTER — Ambulatory Visit (HOSPITAL_COMMUNITY)
Admission: RE | Admit: 2022-06-13 | Discharge: 2022-06-13 | Disposition: A | Discharge status: Home / Self Care | Payer: Medicare Other | Source: Ambulatory Visit | Attending: Vascular Surgery | Admitting: Vascular Surgery

## 2022-06-13 VITALS — BP 151/70 | HR 63 | Temp 97.9°F | Resp 18 | Ht 67.0 in | Wt 151.0 lb

## 2022-06-13 DIAGNOSIS — I6523 Occlusion and stenosis of bilateral carotid arteries: Secondary | ICD-10-CM | POA: Insufficient documentation

## 2022-06-13 DIAGNOSIS — Z9889 Other specified postprocedural states: Secondary | ICD-10-CM

## 2022-06-13 NOTE — Progress Notes (Signed)
Office Note     CC:  follow up Requesting Provider:  Alvira Monday, FNP  HPI: Lydia Graham is a 86 y.o. (1936/06/10) female who presents for routine carotid duplex follow up. She has history of left carotid Endarterectomy by Dr. Donzetta Matters in 2018 for high grade asymptomatic stenosis. She has no history of TIA or Stroke  Today she denies any visual changes, slurred speech, facial drooping, unilateral upper or lower extremity weakness or numbness. She denies any pain in her legs on ambulation or rest. She uses cane to ambulate. She reports swelling in her legs and varicose veins but this is improved with daily compression stocking use.  She says she tries to exercise every day and enjoys oil painting as a hobby.  The pt is not on a statin for cholesterol management due to intolerance The pt is on a daily aspirin.   Other AC:  Eliquis The pt is on CCB, ARB, BB for hypertension.   The pt is not diabetic.   Tobacco hx:  never  Past Medical History:  Diagnosis Date   Arthritis    oa, all over - multiple areas    Carotid artery occlusion    Dysrhythmia    AFib    GERD (gastroesophageal reflux disease)    Headache    h/o migraines    Hypertension    PAF (paroxysmal atrial fibrillation) ([place])    a. multiple DCCV's in 2019 and eventually required initiation of Amiodarone with successful DCCV after this. b. recurrent in 12/2021 while admitted for Urosepsis    Past Surgical History:  Procedure Laterality Date   ABDOMINAL HYSTERECTOMY     APPENDECTOMY     BACK SURGERY     CARDIOVERSION N/A 12/19/2017   Procedure: CARDIOVERSION;  Surgeon: Arnoldo Lenis, MD;  [facility];  Service: Endoscopy;  Laterality: N/A;   CARDIOVERSION N/A 12/26/2017   Procedure: CARDIOVERSION;  Surgeon: [name], MD;  Location: [place];  Service: Cardiovascular;  Laterality: N/A;   CARDIOVERSION N/A 01/26/2018   Procedure: CARDIOVERSION;  Surgeon: Jerline Pain, MD;  Location: [facility]  ENDOSCOPY;  Service: Cardiovascular;  Laterality: N/A;   CAROTID ENDARTERECTOMY     CATARACT EXTRACTION W/PHACO Right 03/09/2021   Procedure: CATARACT EXTRACTION PHACO AND INTRAOCULAR LENS PLACEMENT with Placement of Corticosteroid ([place]);  Surgeon: [name], MD;  Location: [facility];  Service: Ophthalmology;  Laterality: Right;  CDE 9.16   CATARACT EXTRACTION W/PHACO Left 04/02/2021   Procedure: CATARACT EXTRACTION PHACO AND INTRAOCULAR LENS PLACEMENT LEFT EYE;  Surgeon: [name], MD;  Location: [facility];  Service: Ophthalmology;  Laterality: Left;  left CDE=6.75   ENDARTERECTOMY Left 03/27/2017   Procedure: ENDARTERECTOMY CAROTID LEFT;  Surgeon: [name], MD;  Location: [place];  Service: Vascular;  Laterality: Left;   INNER EAR SURGERY Right    puntured eardrum- repaired 2x's    KNEE ARTHROPLASTY     NASAL SINUS SURGERY     deviated septum   PATCH ANGIOPLASTY Left 03/27/2017   Procedure: PATCH ANGIOPLASTY LEFT CAROTID ARTERY USING Rueben Bash BIOLOGIC PATCH;  Surgeon: [name], MD;  Location: [place];  Service: Vascular;  Laterality: Left;   TONSILLECTOMY     TOTAL KNEE ARTHROPLASTY Right 10/23/2016   Procedure: RIGHT TOTAL KNEE ARTHROPLASTY;  Surgeon: [name], MD;  Location: [place];  Service: Orthopedics;  Laterality: Right;    Social History   Socioeconomic History   Marital status: Single    Spouse name: Not  on file   Number of children: Not on file   Years of education: Not on file   Highest education level: Not on file  Occupational History   Not on file  Tobacco Use   Smoking status: Never   Smokeless tobacco: Never  Vaping Use   Vaping Use: Never used  Substance and Sexual Activity   Alcohol use: No   Drug use: No   Sexual activity: Not Currently    Birth control/protection: Surgical  Other Topics Concern   Not on file  Social History Narrative   Not on file   Social Determinants of Health   Financial Resource Strain: Not on  file  Food Insecurity: Not on file  Transportation Needs: Not on file  Physical Activity: Not on file  Stress: Not on file  Social Connections: Not on file  Intimate Partner Violence: Not on file    Family History  Problem Relation Age of Onset   Stroke Mother    Rheum arthritis Sister     Current Outpatient Medications  Medication Sig Dispense Refill   amiodarone (PACERONE) 200 MG tablet Take 0.5 tablets (100 mg total) by mouth daily. Till 02/02/22, then decrease to every other day - 01/04/2022     amLODipine (NORVASC) 5 MG tablet TAKE 1 TABLET AT 8AM AND 1 TABLET AT 8PM. 60 tablet 3   apixaban (ELIQUIS) 5 MG TABS tablet Take 1 tablet (5 mg total) by mouth 2 (two) times daily. 42 tablet 0   Calcium Carbonate-Vitamin D (CALCIUM 600+D PO) Take 1 tablet by mouth daily.     Camphor-Menthol-Methyl Sal (SALONPAS) 3.06-29-08 % PTCH Place 1 patch onto the skin at bedtime as needed (foot pain).     estradiol (ESTRACE) 0.5 MG tablet Take 0.5 mg by mouth daily.      ezetimibe (ZETIA) 10 MG tablet Take 1 tablet (10 mg total) by mouth daily. 30 tablet 0   fluticasone (FLONASE) 50 MCG/ACT nasal spray Place 2 sprays into both nostrils daily as needed for allergies.      furosemide (LASIX) 20 MG tablet Take 1 tablet (20 mg total) by mouth as needed for fluid or edema. 30 tablet 2   Hypromellose (ARTIFICIAL TEARS OP) Place 2 drops into both eyes 2 (two) times daily as needed (for dry eyes).     losartan (COZAAR) 50 MG tablet TAKE 1 TABLET AT 10 AM AND 1 TABLET AT 10 PM. 60 tablet 6   magnesium hydroxide (MILK OF MAGNESIA) 400 MG/5ML suspension Take 30 mLs by mouth daily as needed for mild constipation.     metoprolol succinate (TOPROL-XL) 50 MG 24 hr tablet TAKE 1 TABLET AT LUNCH OR IMMEDIATELY FOLLOWING A MEAL. 30 tablet 6   mupirocin ointment (BACTROBAN) 2 % Apply 1 Application topically daily. 22 g 0   pantoprazole (PROTONIX) 40 MG tablet TAKE (1) TABLET TWICE DAILY. 60 tablet 0   potassium chloride  SA (KLOR-CON M) 20 MEQ tablet Take 1 tablet (20 mEq total) by mouth as needed (take on days you take your Furosemide). 30 tablet 2   vitamin B-12 (CYANOCOBALAMIN) 1000 MCG tablet Take 1,000 mcg by mouth daily.     No current facility-administered medications for this visit.    Allergies  Allergen Reactions   Rosuvastatin Other (See Comments)    MYALGIA, Muscle pain   Lisinopril Cough   Other Other (See Comments)    PAIN MEDICATIONS/ANESTHESIA  MADE BP DROP LOW    Singulair [Montelukast Sodium] Other (See Comments)  fatigue     REVIEW OF SYSTEMS:  '[X]'$  denotes positive finding, '[ ]'$  denotes negative finding Cardiac  Comments:  Chest pain or chest pressure:    Shortness of breath upon exertion:    Short of breath when lying flat:    Irregular heart rhythm:        Vascular    Pain in calf, thigh, or hip brought on by ambulation:    Pain in feet at night that wakes you up from your sleep:     Blood clot in your veins:    Leg swelling:         Pulmonary    Oxygen at home:    Productive cough:     Wheezing:         Neurologic    Sudden weakness in arms or legs:     Sudden numbness in arms or legs:     Sudden onset of difficulty speaking or slurred speech:    Temporary loss of vision in one eye:     Problems with dizziness:         Gastrointestinal    Blood in stool:     Vomited blood:         Genitourinary    Burning when urinating:     Blood in urine:        Psychiatric    Major depression:         Hematologic    Bleeding problems:    Problems with blood clotting too easily:        Skin    Rashes or ulcers:        Constitutional    Fever or chills:      PHYSICAL EXAMINATION:  Vitals:   06/13/22 1022 06/13/22 1025  BP: (!) 156/73 (!) 151/70  Pulse: 63   Resp: 18   Temp: 97.9 F (36.6 C)   SpO2: 96%   Weight: 151 lb (68.5 kg)   Height: '5\' 7"'$  (1.702 m)     General:  WDWN in NAD; vital signs documented above Gait: ambulates with cane HENT: WNL,  normocephalic Pulmonary: normal non-labored breathing , without wheezing Cardiac: regular HR, without  Murmurs without carotid bruit Abdomen: soft, NT, no masses Vascular Exam/Pulses:  Right Left  Radial 2+ (normal) 2+ (normal)  Femoral 2+ (normal) 2+ (normal)  Popliteal 2+ (normal) 2+ (normal)  DP 2+ (normal) 2+ (normal)  PT 1+ (weak) 1+ (weak)   Extremities: without ischemic changes, without Gangrene , without cellulitis; without open wounds;  Musculoskeletal: no muscle wasting or atrophy  Neurologic: A&O X 3;  No focal weakness or paresthesias are detected Psychiatric:  The pt has Normal affect.   Non-Invasive Vascular Imaging:   VAS US Carotid Duplex: Summary:  Right Carotid: Velocities in the right ICA are consistent with a 1-39% stenosis.   Left Carotid: Velocities in the left ICA are consistent with a 1-39% stenosis.   Vertebrals: Bilateral vertebral arteries demonstrate antegrade flow.  Subclavians: Normal flow hemodynamics were seen in bilateral subclavian arteries.    ASSESSMENT/PLAN:: 86 y.o. female here for follow up for carotid artery stenosis. She has history of left CEA in 2018. She has no attributable symptoms to her carotid stenosis. Her duplex today shows 1-39% bilaterally stenosis. Normal flow in her vertebral and subclavian arteries.  - reviewed signs and symptoms of TIA/ Stroke and she understands should this occur to seek immediate medical attention -She will follow up in 1 year with repeat carotid duplex  Karoline Caldwell, PA-C [facility] [phone]  Clinic MD:   Donzetta Matters

## 2022-06-28 ENCOUNTER — Encounter: Payer: Self-pay | Admitting: Family Medicine

## 2022-06-28 ENCOUNTER — Ambulatory Visit (INDEPENDENT_AMBULATORY_CARE_PROVIDER_SITE_OTHER): Payer: Medicare Other | Admitting: Family Medicine

## 2022-06-28 DIAGNOSIS — Z0000 Encounter for general adult medical examination without abnormal findings: Secondary | ICD-10-CM

## 2022-06-28 NOTE — Patient Instructions (Addendum)
I appreciate the opportunity to provide care to you today!    Follow up:  11/05/22      Please continue to a heart-healthy diet and increase your physical activities. Try to exercise for 27mns at least three times a week.      It was a pleasure to see you and I look forward to continuing to work together on your health and well-being. Please do not hesitate to call the office if you need care or have questions about your care.   Have a wonderful day and week. With Gratitude, GAlvira MondayMSN, FNP-BC

## 2022-06-28 NOTE — Progress Notes (Signed)
Subjective:   Lydia Graham is a 87 y.o. female who presents for Medicare Annual (Subsequent) preventive examination.  Review of Systems           Objective:    Today's Vitals   06/28/22 1117  PainSc: 3    There is no height or weight on file to calculate BMI.     12/25/2021    5:27 AM 04/02/2021   11:14 AM 03/29/2021   10:22 AM 03/20/2021   11:25 AM 03/09/2021    6:40 AM 03/05/2021    3:26 PM 01/26/2018   11:14 AM  Advanced Directives  Does Patient Have a Medical Advance Directive? Yes No No No No No Yes  Type of Advance Directive Living will      Living will  Does patient want to make changes to medical advance directive? No - Patient declined        Would patient like information on creating a medical advance directive?  No - Patient declined No - Patient declined No - Patient declined  No - Patient declined     Current Medications (verified) Outpatient Encounter Medications as of 06/28/2022  Medication Sig   amiodarone (PACERONE) 200 MG tablet Take 0.5 tablets (100 mg total) by mouth daily. Till 02/02/22, then decrease to every other day - 01/04/2022   amLODipine (NORVASC) 5 MG tablet TAKE 1 TABLET AT 8AM AND 1 TABLET AT 8PM.   apixaban (ELIQUIS) 5 MG TABS tablet Take 1 tablet (5 mg total) by mouth 2 (two) times daily.   Calcium Carbonate-Vitamin D (CALCIUM 600+D PO) Take 1 tablet by mouth daily.   Camphor-Menthol-Methyl Sal (SALONPAS) 3.06-29-08 % PTCH Place 1 patch onto the skin at bedtime as needed (foot pain).   estradiol (ESTRACE) 0.5 MG tablet Take 0.5 mg by mouth daily.    ezetimibe (ZETIA) 10 MG tablet Take 1 tablet (10 mg total) by mouth daily.   fluticasone (FLONASE) 50 MCG/ACT nasal spray Place 2 sprays into both nostrils daily as needed for allergies.    furosemide (LASIX) 20 MG tablet Take 1 tablet (20 mg total) by mouth as needed for fluid or edema.   Hypromellose (ARTIFICIAL TEARS OP) Place 2 drops into both eyes 2 (two) times daily as needed (for dry eyes).    losartan (COZAAR) 50 MG tablet TAKE 1 TABLET AT 10 AM AND 1 TABLET AT 10 PM.   magnesium hydroxide (MILK OF MAGNESIA) 400 MG/5ML suspension Take 30 mLs by mouth daily as needed for mild constipation.   metoprolol succinate (TOPROL-XL) 50 MG 24 hr tablet TAKE 1 TABLET AT LUNCH OR IMMEDIATELY FOLLOWING A MEAL.   mupirocin ointment (BACTROBAN) 2 % Apply 1 Application topically daily.   pantoprazole (PROTONIX) 40 MG tablet TAKE (1) TABLET TWICE DAILY.   potassium chloride SA (KLOR-CON M) 20 MEQ tablet Take 1 tablet (20 mEq total) by mouth as needed (take on days you take your Furosemide).   vitamin B-12 (CYANOCOBALAMIN) 1000 MCG tablet Take 1,000 mcg by mouth daily.   No facility-administered encounter medications on file as of 06/28/2022.    Allergies (verified) Rosuvastatin, Lisinopril, Other, and Singulair [montelukast sodium]   History: Past Medical History:  Diagnosis Date   Arthritis    oa, all over - multiple areas    Carotid artery occlusion    Dysrhythmia    AFib    GERD (gastroesophageal reflux disease)    Headache    h/o migraines    Hypertension    PAF (paroxysmal atrial  fibrillation) ([place])    a. multiple DCCV's in 2019 and eventually required initiation of Amiodarone with successful DCCV after this. b. recurrent in 12/2021 while admitted for Urosepsis   Past Surgical History:  Procedure Laterality Date   ABDOMINAL HYSTERECTOMY     APPENDECTOMY     BACK SURGERY     CARDIOVERSION N/A 12/19/2017   Procedure: CARDIOVERSION;  Surgeon: Arnoldo Lenis, MD;  [facility];  Service: Endoscopy;  Laterality: N/A;   CARDIOVERSION N/A 12/26/2017   Procedure: CARDIOVERSION;  Surgeon: [name], MD;  Location: [place];  Service: Cardiovascular;  Laterality: N/A;   CARDIOVERSION N/A 01/26/2018   Procedure: CARDIOVERSION;  Surgeon: Jerline Pain, MD;  [facility];  Service: Cardiovascular;  Laterality: N/A;   CAROTID ENDARTERECTOMY     CATARACT  EXTRACTION W/PHACO Right 03/09/2021   Procedure: CATARACT EXTRACTION PHACO AND INTRAOCULAR LENS PLACEMENT with Placement of Corticosteroid ([place]);  Surgeon: [name], MD;  Location: [facility];  Service: Ophthalmology;  Laterality: Right;  CDE 9.16   CATARACT EXTRACTION W/PHACO Left 04/02/2021   Procedure: CATARACT EXTRACTION PHACO AND INTRAOCULAR LENS PLACEMENT LEFT EYE;  Surgeon: [name], MD;  Location: [facility];  Service: Ophthalmology;  Laterality: Left;  left CDE=6.75   ENDARTERECTOMY Left 03/27/2017   Procedure: ENDARTERECTOMY CAROTID LEFT;  Surgeon: [name], MD;  Location: [place];  Service: Vascular;  Laterality: Left;   INNER EAR SURGERY Right    puntured eardrum- repaired 2x's    KNEE ARTHROPLASTY     NASAL SINUS SURGERY     deviated septum   PATCH ANGIOPLASTY Left 03/27/2017   Procedure: PATCH ANGIOPLASTY LEFT CAROTID ARTERY USING Rueben Bash BIOLOGIC PATCH;  Surgeon: [name], MD;  Location: [place];  Service: Vascular;  Laterality: Left;   TONSILLECTOMY     TOTAL KNEE ARTHROPLASTY Right 10/23/2016   Procedure: RIGHT TOTAL KNEE ARTHROPLASTY;  Surgeon: [name], MD;  Location: [place];  Service: Orthopedics;  Laterality: Right;   Family History  Problem Relation Age of Onset   Stroke Mother    Rheum arthritis Sister    Social History   Socioeconomic History   Marital status: Single    Spouse name: Not on file   Number of children: Not on file   Years of education: Not on file   Highest education level: Not on file  Occupational History   Not on file  Tobacco Use   Smoking status: Never   Smokeless tobacco: Never  Vaping Use   Vaping Use: Never used  Substance and Sexual Activity   Alcohol use: No   Drug use: No   Sexual activity: Not Currently    Birth control/protection: Surgical  Other Topics Concern   Not on file  Social History Narrative   Not on file   Social Determinants of Health   Financial Resource Strain: Low Risk   (06/28/2022)   Overall Financial Resource Strain (CARDIA)    Difficulty of Paying Living Expenses: Not hard at all  Food Insecurity: No Food Insecurity (06/28/2022)   Hunger Vital Sign    Worried About Running Out of Food in the Last Year: Never true    [place] in the Last Year: Never true  Transportation Needs: No Transportation Needs (06/28/2022)   PRAPARE - Hydrologist (Medical): No    Lack of Transportation (Non-Medical): No  Physical Activity: Sufficiently Active (06/28/2022)   Exercise Vital Sign  Days of Exercise per Week: 3 days    Minutes of Exercise per Session: 50 min  Stress: No Stress Concern Present (06/28/2022)   [place]    Feeling of Stress : Not at all  Social Connections: Moderately Integrated (06/28/2022)   Social Connection and Isolation Panel [NHANES]    Frequency of Communication with Friends and Family: More than three times a week    Frequency of Social Gatherings with Friends and Family: More than three times a week    Attends Religious Services: More than 4 times per year    Active Member of [employer] or Organizations: Yes    Attends Archivist Meetings: More than 4 times per year    Marital Status: Widowed    Tobacco Counseling Counseling given: Not Answered   Clinical Intake:  Pre-visit preparation completed: Yes  Pain : 0-10 Pain Score: 3  Pain Type: Other (Comment) Pain Location: Other (Comment) Pain Orientation: Other (Comment) Pain Descriptors / Indicators: Aching Pain Onset: Today Pain Frequency: Constant Pain Relieving Factors: tylenol Effect of Pain on Daily Activities: pain all the time due to arthritis  Pain Relieving Factors: tylenol  Diabetes: No  How often do you need to have someone help you when you read instructions, pamphlets, or other written materials from your doctor or pharmacy?: 1 - Never What is the last grade  level you completed in school?: 12th  Diabetic? no  Interpreter Needed?: No      Activities of Daily Living    12/25/2021    5:27 AM  In your present state of health, do you have any difficulty performing the following activities:  Hearing? 0  Vision? 1  Difficulty concentrating or making decisions? 0  Walking or climbing stairs? 0  Dressing or bathing? 0  Doing errands, shopping? 0    Patient Care Team: Alvira Monday, [place] as PCP - General (Family Medicine) [name], MD as PCP - Electrophysiology (Cardiology) Harl Bowie Alphonse Guild, MD as PCP - Cardiology (Cardiology) Thornell Sartorius, MD as Consulting Physician (Otolaryngology)  Indicate any recent Medical Services you may have received from other than [facility] providers in the past year (date may be approximate).     Assessment:   This is a routine wellness examination for [place].  Hearing/Vision screen No results found.  Dietary issues and exercise activities discussed:     Goals Addressed             This Visit's Progress    Maintain Mobility and Function       Evidence-based guidance:  Emphasize the importance of physical activity and aerobic exercise as included in treatment plan; assess barriers to adherence; consider patient's abilities and preferences.  Encourage gradual increase in activity or exercise instead of stopping if pain occurs.  Reinforce individual therapy exercise prescription, such as strengthening, stabilization and stretching programs.  Promote optimal body mechanics to stabilize the spine with lifting and functional activity.  Encourage activity and mobility modifications to facilitate optimal function, such as using a log roll for bed mobility or dressing from a seated position.  Reinforce individual adaptive equipment recommendations to limit excessive spinal movements, such as a Systems analyst.  Assess adequacy of sleep; encourage use of sleep hygiene techniques, such as  bedtime routine; use of white noise; dark, cool bedroom; avoiding daytime naps, heavy meals or exercise before bedtime.  Promote positions and modification to optimize sleep and sexual activity; consider pillows or positioning devices to  assist in maintaining neutral spine.  Explore options for applying ergonomic principles at work and home, such as frequent position changes, using ergonomically designed equipment and working at optimal height.  Promote modifications to increase comfort with driving such as lumbar support, optimizing seat and steering wheel position, using cruise control and taking frequent rest stops to stretch and walk.   Notes:       Depression Screen    06/28/2022   11:27 AM 06/28/2022   11:22 AM 05/08/2022   11:08 AM 01/22/2022    8:09 AM  PHQ 2/9 Scores  PHQ - 2 Score 0 0 0 0  PHQ- 9 Score 0 0 0     Fall Risk    06/28/2022   11:26 AM 05/08/2022   11:08 AM 01/22/2022    8:09 AM  Fall Risk   Falls in the past year? 0 0 0  Number falls in past yr: 0 0 0  Injury with Fall? 0 0 0  Risk for fall due to : No Fall Risks No Fall Risks No Fall Risks  Follow up Falls evaluation completed Falls evaluation completed Falls evaluation completed    [place]:  Any stairs in or around the home? Yes  If so, are there any without handrails? Yes  Home free of loose throw rugs in walkways, pet beds, electrical cords, etc? Yes  Adequate lighting in your home to reduce risk of falls? Yes   ASSISTIVE DEVICES UTILIZED TO PREVENT FALLS:  Life alert? No  Use of a cane, walker or w/c? Yes  Grab bars in the bathroom? No  Shower chair or bench in shower? No  Elevated toilet seat or a handicapped toilet? Yes   TIMED UP AND GO:  Was the test performed? Yes .  Length of time to ambulate 10 feet: 10 sec.   Gait slow and steady without use of assistive device  Cognitive Function:        06/28/2022   11:27 AM  6CIT Screen  What Year? 0 points   What month? 0 points  What time? 0 points  Count back from 20 0 points  Months in reverse 0 points  Repeat phrase 0 points  Total Score 0 points    Immunizations Immunization History  Administered Date(s) Administered   Influenza, Quadrivalent, Recombinant, Inj, Pf 04/29/2022   Influenza,inj,Quad PF,6+ Mos 03/12/2019   Influenza-Unspecified 02/23/2016, 03/30/2019   PFIZER(Purple Top)SARS-COV-2 Vaccination 09/16/2019, 10/09/2019   Tdap 02/17/1995    TDAP status: Completed at today's visit  Flu Vaccine status: Up to date  Pneumococcal vaccine status: Declined,  Education has been provided regarding the importance of this vaccine but patient still declined. Advised may receive this vaccine at local pharmacy or Health Dept. Aware to provide a copy of the vaccination record if obtained from local pharmacy or Health Dept. Verbalized acceptance and understanding.   Covid-19 vaccine status: Completed vaccines  Qualifies for Shingles Vaccine? Yes   [facility]ax [facility], will  [facility]    Shingrix Completed?: Yes, will bring records  Screening Tests Health Maintenance  Topic Date Due   Zoster Vaccines- Shingrix (1 of 2) Never done   DTaP/Tdap/Td (2 - Td or Tdap) 02/16/2005   COVID-19 Vaccine (3 - Pfizer risk series) 11/06/2019   Pneumonia Vaccine 95+ Years old (1 - PCV) 05/09/2023 (Originally 12/17/2002)   Medicare Annual Wellness (AWV)  06/29/2023   INFLUENZA VACCINE  Completed   DEXA SCAN  Completed   HPV VACCINES  Aged Out    Health Maintenance  Health Maintenance Due  Topic Date Due   Zoster Vaccines- Shingrix (1 of 2) Never done   DTaP/Tdap/Td (2 - Td or Tdap) 02/16/2005   COVID-19 Vaccine (3 - Pfizer risk series) 11/06/2019    Colorectal cancer screening: No longer required.   Mammogram status: Completed 2023. Repeat every year Dr. Tyson Babinski Density status: Completed 2014. Results reflect: Bone density results: OSTEOPENIA. Repeat every 1  years. Declines f/u screening  Lung Cancer Screening: (Low Dose CT Chest recommended if Age 4-80 years, 30 pack-year currently smoking OR have quit w/in 15years.) does not qualify.   Lung Cancer Screening Referral: n/a  Additional Screening:  Hepatitis C Screening: does not qualify; Completed   Vision Screening: Recommended annual ophthalmology exams for early detection of glaucoma and other disorders of the eye. Is the patient up to date with their annual eye exam?  Yes  June 2023.  Who is the provider or what is the name of the office in which the patient attends annual eye exams? [facility] If pt is not established with a provider, would they like to be referred to a provider to establish care? No .   Dental Screening: Recommended annual dental exams for proper oral hygiene  Community Resource Referral / Chronic Care Management: CRR required this visit?  No   CCM required this visit?  No      Plan:     I have personally reviewed and noted the following in the patient's chart:   Medical and social history Use of alcohol, tobacco or illicit drugs  Current medications and supplements including opioid prescriptions. Patient is not currently taking opioid prescriptions. Functional ability and status Nutritional status Physical activity Advanced directives List of other physicians Hospitalizations, surgeries, and ER visits in previous 12 months Vitals Screenings to include cognitive, depression, and falls Referrals and appointments  In addition, I have reviewed and discussed with patient certain preventive protocols, quality metrics, and best practice recommendations. A written personalized care plan for preventive services as well as general preventive health recommendations were provided to patient.     Alvira Monday, FNP   06/28/2022   Nurse Notes:

## 2022-07-09 ENCOUNTER — Other Ambulatory Visit: Payer: Self-pay | Admitting: Cardiology

## 2022-07-26 ENCOUNTER — Other Ambulatory Visit: Payer: Self-pay | Admitting: Cardiology

## 2022-08-01 ENCOUNTER — Encounter (HOSPITAL_COMMUNITY): Payer: Self-pay | Admitting: *Deleted

## 2022-08-08 ENCOUNTER — Ambulatory Visit (INDEPENDENT_AMBULATORY_CARE_PROVIDER_SITE_OTHER): Payer: Medicare Other | Admitting: Physical Medicine and Rehabilitation

## 2022-08-08 ENCOUNTER — Encounter: Payer: Self-pay | Admitting: Physical Medicine and Rehabilitation

## 2022-08-08 DIAGNOSIS — M542 Cervicalgia: Secondary | ICD-10-CM

## 2022-08-08 DIAGNOSIS — M25512 Pain in left shoulder: Secondary | ICD-10-CM | POA: Diagnosis not present

## 2022-08-08 DIAGNOSIS — M47812 Spondylosis without myelopathy or radiculopathy, cervical region: Secondary | ICD-10-CM

## 2022-08-08 DIAGNOSIS — G8929 Other chronic pain: Secondary | ICD-10-CM | POA: Diagnosis not present

## 2022-08-08 DIAGNOSIS — M7918 Myalgia, other site: Secondary | ICD-10-CM
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
## 2022-08-08 NOTE — Progress Notes (Signed)
Functional Pain Scale - descriptive words and definitions  Intense (8)    Cannot complete any ADLs without much assistance/cannot concentrate/conversation is difficult/unable to sleep and unable to use distraction. Severe range order  Average Pain  varies on movement  Neck pain on left side. Feels pressure when walking, pain when turning to the left

## 2022-08-09 MED ORDER — KETOROLAC TROMETHAMINE 30 MG/ML IJ SOLN
30.0000 mg | INTRAMUSCULAR | Status: AC | PRN
  Administered 2022-08-08: 30 mg via INTRAMUSCULAR

## 2022-08-19 ENCOUNTER — Ambulatory Visit (INDEPENDENT_AMBULATORY_CARE_PROVIDER_SITE_OTHER): Payer: Medicare Other | Admitting: Physical Therapy

## 2022-08-19 ENCOUNTER — Encounter: Payer: Self-pay | Admitting: Physical Therapy

## 2022-08-19 DIAGNOSIS — M6281 Muscle weakness (generalized): Secondary | ICD-10-CM

## 2022-08-19 DIAGNOSIS — R293 Abnormal posture: Secondary | ICD-10-CM

## 2022-08-19 DIAGNOSIS — M542 Cervicalgia: Secondary | ICD-10-CM
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
## 2022-08-26 ENCOUNTER — Ambulatory Visit (HOSPITAL_COMMUNITY)
Admission: RE | Admit: 2022-08-26 | Discharge: 2022-08-26 | Disposition: A | Discharge status: Home / Self Care | Payer: Medicare Other | Source: Ambulatory Visit | Attending: Physical Medicine and Rehabilitation | Admitting: Physical Medicine and Rehabilitation

## 2022-08-26 DIAGNOSIS — M542 Cervicalgia: Secondary | ICD-10-CM | POA: Diagnosis not present

## 2022-08-26 DIAGNOSIS — R600 Localized edema: Secondary | ICD-10-CM | POA: Diagnosis not present

## 2022-08-28 ENCOUNTER — Telehealth: Payer: Self-pay | Admitting: Physical Medicine and Rehabilitation

## 2022-08-28 ENCOUNTER — Ambulatory Visit (INDEPENDENT_AMBULATORY_CARE_PROVIDER_SITE_OTHER): Payer: Medicare Other | Admitting: Physical Therapy

## 2022-08-28 ENCOUNTER — Encounter: Payer: Self-pay | Admitting: Physical Therapy

## 2022-08-28 DIAGNOSIS — R293 Abnormal posture: Secondary | ICD-10-CM

## 2022-08-28 DIAGNOSIS — M542 Cervicalgia: Secondary | ICD-10-CM | POA: Diagnosis not present

## 2022-08-28 DIAGNOSIS — M6281 Muscle weakness (generalized): Secondary | ICD-10-CM | POA: Diagnosis not present

## 2022-08-28 NOTE — Telephone Encounter (Signed)
New message    The patient was seen today would like an appointment with Dr. Ernestina Patches

## 2022-08-28 NOTE — Therapy (Addendum)
OUTPATIENT PHYSICAL THERAPY TREATMENT   Patient Name: Lydia Graham MRN: [id number] DOB:1936/05/02, 87 y.o., female Today's Date: 08/28/2022  END OF SESSION:  PT End of Session - 08/28/22 0814     Visit Number 2    Number of Visits 16    Date for PT Re-Evaluation 10/18/22    PT Start Time 0805    PT Stop Time 0843    PT Time Calculation (min) 38 min    Activity Tolerance Patient tolerated treatment well    Behavior During Therapy [facility] for tasks assessed/performed              Past Medical History:  Diagnosis Date   Arthritis    oa, all over - multiple areas    Carotid artery occlusion    Dysrhythmia    AFib    GERD (gastroesophageal reflux disease)    Headache    h/o migraines    Hypertension    PAF (paroxysmal atrial fibrillation) ([place])    a. multiple DCCV's in 2019 and eventually required initiation of Amiodarone with successful DCCV after this. b. recurrent in 12/2021 while admitted for Urosepsis   Past Surgical History:  Procedure Laterality Date   ABDOMINAL HYSTERECTOMY     APPENDECTOMY     BACK SURGERY     CARDIOVERSION N/A 12/19/2017   Procedure: CARDIOVERSION;  Surgeon: Arnoldo Lenis, MD;  [facility];  Service: Endoscopy;  Laterality: N/A;   CARDIOVERSION N/A 12/26/2017   Procedure: CARDIOVERSION;  Surgeon: [name], MD;  Location: [place];  Service: Cardiovascular;  Laterality: N/A;   CARDIOVERSION N/A 01/26/2018   Procedure: CARDIOVERSION;  Surgeon: Jerline Pain, MD;  Location: [facility] ENDOSCOPY;  Service: Cardiovascular;  Laterality: N/A;   CAROTID ENDARTERECTOMY     CATARACT EXTRACTION W/PHACO Right 03/09/2021   Procedure: CATARACT EXTRACTION PHACO AND INTRAOCULAR LENS PLACEMENT with Placement of Corticosteroid ([place]);  Surgeon: [name], MD;  Location: [facility];  Service: Ophthalmology;  Laterality: Right;  CDE 9.16   CATARACT EXTRACTION W/PHACO Left 04/02/2021   Procedure: CATARACT EXTRACTION PHACO AND INTRAOCULAR LENS  PLACEMENT LEFT EYE;  Surgeon: [name], MD;  Location: [facility];  Service: Ophthalmology;  Laterality: Left;  left CDE=6.75   ENDARTERECTOMY Left 03/27/2017   Procedure: ENDARTERECTOMY CAROTID LEFT;  Surgeon: [name], MD;  Location: [place];  Service: Vascular;  Laterality: Left;   INNER EAR SURGERY Right    puntured eardrum- repaired 2x's    KNEE ARTHROPLASTY     NASAL SINUS SURGERY     deviated septum   PATCH ANGIOPLASTY Left 03/27/2017   Procedure: PATCH ANGIOPLASTY LEFT CAROTID ARTERY USING Rue[employer] Bash BIOLOGIC PATCH;  Surgeon: [name], MD;  Location: [place];  Service: Vascular;  Laterality: Left;   TONSILLECTOMY     TOTAL KNEE ARTHROPLASTY Right 10/23/2016   Procedure: RIGHT TOTAL KNEE ARTHROPLASTY;  Surgeon: [name], MD;  Location: [place];  Service: Orthopedics;  Laterality: Right;   Patient Active Problem List   Diagnosis Date Noted   Lumbar radiculopathy 05/08/2022   Hyperlipidemia 05/08/2022   Cellulitis of right forearm 01/22/2022   Sepsis secondary to UTI ([place]) 12/26/2021   Acute pyelonephritis [id number]   Acute metabolic encephalopathy [id number]   Sepsis ([place]) 12/25/2021   AKI (acute kidney injury) ([place]) 12/25/2021   Hyponatremia 12/25/2021   Dehydration 12/25/2021   Atrial fibrillation, chronic ([place]) 12/25/2021   Essential hypertension 12/25/2021   GERD (gastroesophageal reflux disease) 12/25/2021   Acquired trigger finger  of left middle finger 08/04/2020   Arthritis of hand 08/04/2020   Osteoarthritis of metacarpophalangeal (MCP) joint 08/03/2020   Bilateral hand pain 06/22/2020   Osteoarthritis of hands, 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   Osteoarthritis of feet, 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   PAF (paroxysmal atrial fibrillation) ([facility])    Chronic cough 12/29/2017   Atrial fibrillation with rapid ventricular response ([place]) 12/23/2017   Acute on chronic combined systolic and diastolic CHF (congestive heart failure) ([place])    Menopausal symptom  10/17/2017   Menopausal syndrome 10/17/2017   Carotid stenosis 03/27/2017   S/P total knee arthroplasty, right 10/23/2016   Unilateral primary osteoarthritis, right knee 07/13/2016    PCP: Alvira Monday, FNP   REFERRING PROVIDER:  Lorine Bears, NP   REFERRING DIAG: M54.2 (ICD-10-CM) - Cervicalgia [phone] (ICD-10-CM) - Chronic left shoulder pain M79.18 (ICD-10-CM) - Myofascial pain M47.812 (ICD-10-CM) - Facet arthropathy, cervical  THERAPY DIAG:  Cervicalgia  Muscle weakness (generalized)  Abnormal posture  Rationale for Evaluation and Treatment: Rehabilitation  ONSET DATE: July 2023  SUBJECTIVE:                                                                                                                                                                                                         SUBJECTIVE STATEMENT: Reports her pain is about the same. Dry needling was only beneficial for a couple of days.    PERTINENT HISTORY:  Arthritis, GERD, Carotid artery occlusion, HTN, HA, scoloisis of lumbar spine, cardioversion 2019, back surgery, abdominal hysterectomy  PAIN:  NPRS scale: 5-6, up to 10/10 Pain location: left sided cervical spine Pain description: achy Aggravating factors: turning head Relieving factors: brace, tylenol  PRECAUTIONS: None  WEIGHT BEARING RESTRICTIONS: No  FALLS:  Has patient fallen in last 6 months? No  LIVING ENVIRONMENT: Lives with: lives with their family and lives alone Lives in: House/apartment Stairs: Yes: External: 2 steps; on left going up Has following equipment at home: Single point cane  OCCUPATION: retired  PLOF: Independent  PATIENT GOALS: Be able to turn my head without moving my entire body. Stop hurting.   Next MD visit:  OBJECTIVE:   DIAGNOSTIC FINDINGS:  MRI 08/26/22 IMPRESSION: 1. Multilevel cervical disc and facet degeneration without spinal stenosis. 2. Moderate right neural foraminal stenosis  at C5-6. 3. Mild-to-moderate right neural foraminal stenosis at C3-4. 4. C1-2 arthropathy with left-sided marrow edema. 5. 1.5 cm incidental right thyroid nodule. Recommend non-emergent thyroid ultrasound   Cervical x-rays exhibits severe multi level degenerative changes, anterolisthesis, disc height loss and prominent facet arthropathy noted at the level  of C5-C6.   IMPRESSION:  1. Lumbar spondylosis and degenerative disc disease, causing  moderate impingement at L3-4 and mild impingement at L2-3 and L4-5,  as detailed above.  2. Levoconvex lumbar scoliosis with rotary component.    PATIENT SURVEYS:  08/19/22: FOTO intake: 54%    COGNITION: Overall cognitive status: Within functional limits for tasks assessed  SENSATION: WFL  POSTURE:  head tilted to the right with left shoulder hiking at rest, forward head and rounded shoulders  PALPATION: TTP: Left upper trap and left levator   CERVICAL ROM:   ROM AROM (deg) 08/19/22  Flexion 18  Extension 8  Right lateral flexion 16  Left lateral flexion 8  Right rotation 25  Left rotation 12   (Blank rows = not tested)  UPPER EXTREMITY ROM:  Active ROM Right 08/19/22 Left 08/19/22  Shoulder flexion 140 144  Shoulder extension    Shoulder abduction 136 130  Shoulder adduction    Shoulder extension    Shoulder internal rotation    Shoulder external rotation    Elbow flexion    Elbow extension    Wrist flexion    Wrist extension    Wrist ulnar deviation    Wrist radial deviation    Wrist pronation    Wrist supination     (Blank rows = not tested)  UPPER EXTREMITY MMT:  MMT  Right eval Left eval  Shoulder flexion 4 3  Shoulder extension    Shoulder abduction    Shoulder adduction    Shoulder extension    Shoulder internal rotation    Shoulder external rotation    Middle trapezius    Lower trapezius    Elbow flexion    Elbow extension    Wrist flexion    Wrist extension    Wrist ulnar deviation    Wrist  radial deviation    Wrist pronation    Wrist supination    Grip strength     (Blank rows = not tested)    TODAY'S TREATMENT: DATE:  08/28/22 TherEx Seated upper trap stretch 3x10 sec bil Seated levator scapula stretch 3x10 sec bil Scapular retraction 10 x 5 sec hold Supine cervical rotation 10 x 5 sec hold bil Supine cervical retraction 10 x 5 sec hold     08/19/22: Therex: HEP instruction/performance c cues for techniques, handout provided.  Trial set performed of each for comprehension and symptom assessment.  See below for exercise list Manual:  Skilled palpation of active trigger points during TPDN Trigger Point Dry-Needling  Treatment instructions: Expect mild to moderate muscle soreness. S/S of pneumothorax if dry needled over a lung field, and to seek immediate medical attention should they occur. Patient verbalized understanding of these instructions and education.  Patient Consent Given: Yes Education handout provided: Yes Muscles treated: bilateral cervical paraspinals, left suboccipitals  Treatment response/outcome: twitches noted   PATIENT EDUCATION:  Education details: HEP, POC Person educated: Patient Education method: Consulting civil engineer, Demonstration, Verbal cues, and Handouts Education comprehension: verbalized understanding, returned demonstration, and verbal cues required  HOME EXERCISE PROGRAM: Access Code: [email or web address] URL: [email or web address] Date: 08/28/2022 Prepared by: Faustino Congress  Exercises - Seated Upper Trapezius Stretch  - 3 x daily - 7 x weekly - 5 reps - 10 seconds hold - Gentle Levator Scapulae Stretch  - 3 x daily - 7 x weekly - 5 reps - 10 seconds hold - Seated Scapular Retraction  - 3 x daily - 7 x weekly - 10 reps - 10 seconds hold -  Supine Cervical Rotation AROM on Pillow  - 3 x daily - 7 x weekly - 1 sets - 10 reps - 5 sec hold - Supine Chin Tuck  - 3 x daily - 7 x weekly - 1 sets - 10 reps - 5 sec  hold  ASSESSMENT:  CLINICAL IMPRESSION: Pt tolerated session well today with focus on review of HEP and light cervical exercises to help with ROM, posture and pain.  DN was only helpful for short term so deferred today, but can reconsider at future visits.  Would possibly benefit from estim, but due to recent finding of thyroid nodule will defer at this time.  Will continue to benefit from PT to maximize function.  OBJECTIVE IMPAIRMENTS: decreased ROM, decreased strength, impaired UE functional use, postural dysfunction, and pain.   ACTIVITY LIMITATIONS: lifting, sitting, sleeping, dressing, and reach over head  PARTICIPATION LIMITATIONS: community activity and church  PERSONAL FACTORS: 3+ comorbidities: see above  are also affecting patient's functional outcome.   REHAB POTENTIAL: Good  CLINICAL DECISION MAKING: Stable/uncomplicated  EVALUATION COMPLEXITY: Low   GOALS: Goals reviewed with patient? Yes  SHORT TERM GOALS: (target date for Short term goals are 3 weeks 09/13/22)  1.Patient will demonstrate independent use of home exercise program to maintain progress from in clinic treatments. Goal status: New  LONG TERM GOALS: (target dates for all long term goals are 8 weeks  10/18/22 )   1. Patient will demonstrate/report pain at worst less than or equal to 2/10 to facilitate minimal limitation in daily activity secondary to pain symptoms. Goal status: New   2. Patient will demonstrate independent use of home exercise program to facilitate ability to maintain/progress functional gains from skilled physical therapy services. Goal status: New   3. Patient will demonstrate FOTO outcome > or = 59 % to indicate reduced disability due to condition. Goal status: New   4.  Patient will demonstrate cervical AROM WFL s symptoms to facilitate usual head movements for daily activity including driving, self care.   Goal status: New   5.  Pt will improve her cervical rotation to >/=  40  degrees bilaterally to help improve driving and functional mobility.  Goal status: New   6.  Pt will be able to lift 3# from counter to overhead shelf with no pain reported.  Goal status: New      PLAN:  PT FREQUENCY: 1-2x/week  PT DURATION: 8 weeks  PLANNED INTERVENTIONS: Therapeutic exercises, Therapeutic activity, Neuro Muscular re-education, Balance training, Gait training, Patient/Family education, Joint mobilization, Stair training, DME instructions, Dry Needling, Electrical stimulation, Cryotherapy, vasopneumatic device,Traction, Moist heat, Taping, Ultrasound, Ionotophoresis '4mg'$ /ml Dexamethasone, and Manual therapy.  All included unless contraindicated  PLAN FOR NEXT SESSION: Repeat DN if indicated, Review HEP use/response, postural exercises, manual as needed No estim until work up for thyroid nodule determined benign.     Laureen Abrahams, PT, DPT 08/28/22 8:54 AM

## 2022-08-29 ENCOUNTER — Ambulatory Visit (INDEPENDENT_AMBULATORY_CARE_PROVIDER_SITE_OTHER): Payer: Medicare Other | Admitting: Physical Medicine and Rehabilitation

## 2022-08-29 ENCOUNTER — Encounter: Payer: Self-pay | Admitting: Physical Medicine and Rehabilitation

## 2022-08-29 ENCOUNTER — Telehealth: Payer: Self-pay

## 2022-08-29 DIAGNOSIS — M47812 Spondylosis without myelopathy or radiculopathy, cervical region: Secondary | ICD-10-CM

## 2022-08-29 DIAGNOSIS — M542 Cervicalgia: Secondary | ICD-10-CM

## 2022-08-29 DIAGNOSIS — M7918 Myalgia, other site: Secondary | ICD-10-CM

## 2022-08-29 NOTE — Telephone Encounter (Signed)
-----   Message from Lorine Bears, NP sent at 08/29/2022  8:13 AM EST ----- OV. Thanks ----- Message ----- From: Magnus Sinning, MD Sent: 08/29/2022   8:12 AM EST To: Lorine Bears, NP  I think we ned OV to discuss with her options  ----- Message ----- From: Lorine Bears, NP Sent: 08/27/2022   2:39 PM EST To: Magnus Sinning, MD  We talked about possible C2-C3 facet joint injection on the left.  ----- Message ----- From: Interface, Rad Results In Sent: 08/27/2022  12:11 PM EST To: Lorine Bears, NP

## 2022-08-29 NOTE — Progress Notes (Unsigned)
Lydia Graham - 87 y.o. female MRN [id number]  Date of birth: [birth year]  Office Visit Note: Visit Date: 08/29/2022 PCP: Alvira Monday, FNP Referred by: Alvira Monday, FNP  Subjective: Chief Complaint  Patient presents with   Lower Back - Pain   HPI: Lydia Graham is a 87 y.o. female who comes in today Chronic, worsening and severe left sided neck pain radiating to left shoulder and upper back. Patient is somewhat of poor historian. Pain ongoing for several years, worsens with movement and activity. She describes pain as sore, aching and tenderness, currently rates as 8 out of 10. Severe pain with side to side rotation of neck, particularly to left side. Some relief of pain with home exercise regimen, rest and use of medications. Currently undergoing formal physical therapy/dry needling treatments, minimal relief of pain with these treatments. History of cervical myofascial trigger point injections performed in our office this past November, however she does not recall undergoing this procedure. Recent cervical MRI imaging exhibits multi level degenerative changes, there is C1-C2 arthropathy with left-sided marrow edema. No high grade spinal canal stenosis noted. Patient is very active, helps to care for her grandchildren. Does ambulate with cane. Patient denies focal weakness, numbness and tingling. No recent trauma or falls noted.    Review of Systems  Musculoskeletal:  Positive for myalgias and neck pain.  Neurological:  Negative for tingling, sensory change, focal weakness and weakness.  All other systems reviewed and are negative.  Otherwise per HPI.  Assessment & Plan: Visit Diagnoses:    ICD-10-CM   1. Cervicalgia  M54.2 Ambulatory referral to Physical Medicine Rehab    2. Facet arthropathy, cervical  M47.812 Ambulatory referral to Physical Medicine Rehab    3. Myofascial pain  M79.18 Ambulatory referral to Physical Medicine Rehab       Plan: Findings:  Chronic,  worsening and severe left sided neck pain radiating to left shoulder and upper back.  Patient continues to have severe pain despite good conservative therapy such as formal physical therapy/dry needling, home exercise regimen, rest and use of medications. I did discuss recent cervical MRI with patient today using images and spine model.  Patient's clinical presentation and exam are consistent with facet mediated pain. There is C1-C2 arthropathy with left-sided marrow edema.  Severe pain with side-to-side rotation of her neck upon exam today.  I also feel there is a myofascial component contributing to her pain, multiple palpable trigger points noted to left levator scapula and trapezius regions upon exam today. Next step is to perform diagnostic and hopefully therapeutic left C2-C3 facet joint injections. Injection at the level of C1-C2 would be ideal, however this procedure does carry increased risk due to anatomical position of the vertebral artery. I did discuss injection procedure in detail today, she has no questions at this time. I encouraged her to remain active and to continue with physical therapy/dry needling. No red flag symptoms noted upon exam today.     Meds & Orders: No orders of the defined types were placed in this encounter.   Orders Placed This Encounter  Procedures   Ambulatory referral to Physical Medicine Rehab    Follow-up: Return for Left C2-C3 facet joint injections.   Procedures: No procedures performed      Clinical History: Narrative & Impression CLINICAL DATA:  Chronic neck pain.   EXAM: MRI CERVICAL SPINE WITHOUT CONTRAST   TECHNIQUE: Multiplanar, multisequence MR imaging of the cervical spine was performed. No intravenous contrast was administered.  COMPARISON:  Cervical spine radiographs 04/30/2022   FINDINGS: Alignment: Grade 1 anterolisthesis from C4-5 through T1-2, greatest C6-7 where it measures 4-5 mm. Mild-to-moderate left convex curvature of the  lower cervical spine.   Vertebrae: No fracture or suspicious marrow lesion. Moderate marrow edema associated with left-sided C1-2 arthropathy. Small right-sided C1-2 joint effusion. Mild degenerative endplate edema at [id number].   Cord: Normal signal.   Posterior Fossa, vertebral arteries, paraspinal tissues: 1.5 cm T2 hyperintense nodule in the posterior right thyroid lobe. Minor T2 hyperintensities in the pons, nonspecific but most often seen with chronic small vessel ischemia. Preserved vertebral artery flow voids.   Disc levels:   C2-3: Shallow right central disc protrusion without stenosis.   C3-4: Uncovertebral spurring and mild facet arthrosis result in mild-to-moderate right neural foraminal stenosis without spinal stenosis.   C4-5: Mild right and moderate to severe left facet arthrosis without stenosis.   C5-6: Severe disc space narrowing. Anterolisthesis with bulging uncovered disc, asymmetric right uncovertebral spurring, and moderate right and mild left facet arthrosis result in moderate right neural foraminal stenosis without spinal stenosis.   C6-7: Severe disc space narrowing. Anterolisthesis with disc uncovering, uncovertebral spurring, and moderate right and mild left facet arthrosis. No significant stenosis.   C7-T1: Anterolisthesis with disc uncovering and moderate right facet arthrosis. No stenosis.   T1-2: Anterolisthesis with disc uncovering and moderate right facet arthrosis. No stenosis.   IMPRESSION: 1. Multilevel cervical disc and facet degeneration without spinal stenosis. 2. Moderate right neural foraminal stenosis at C5-6. 3. Mild-to-moderate right neural foraminal stenosis at C3-4. 4. C1-2 arthropathy with left-sided marrow edema. 5. 1.5 cm incidental right thyroid nodule. Recommend non-emergent thyroid ultrasound. Reference: J Am Coll Radiol. 2015 Feb;12(2): 143-50     Electronically Signed   By: Logan Bores M.D.   On: 08/27/2022 12:08    She reports that she has never smoked. She has never used smokeless tobacco.  Recent Labs    05/21/22 1009  HGBA1C 5.3    Objective:  VS:  HT:    WT:   BMI:     BP:   HR: bpm  TEMP: ( )  RESP:  Physical Exam Vitals and nursing note reviewed.  HENT:     Head: Normocephalic and atraumatic.     Right Ear: External ear normal.     Left Ear: External ear normal.     Nose: Nose normal.     Mouth/Throat:     Mouth: Mucous membranes are moist.  Eyes:     Extraocular Movements: Extraocular movements intact.  Cardiovascular:     Rate and Rhythm: Normal rate.     Pulses: Normal pulses.  Pulmonary:     Effort: Pulmonary effort is normal.  Abdominal:     General: Abdomen is flat. There is no distension.  Musculoskeletal:        General: Tenderness present.     Cervical back: Tenderness present.     Comments: Discomfort noted with side-to-side rotation. Patient has good strength in the upper extremities including 5 out of 5 strength in wrist extension, long finger flexion and APB. There is no atrophy of the hands intrinsically.  Sensation intact bilaterally. Multiple palpable trigger points noted to left levator scapulae and trapezius muscles. Negative Hoffman's sign.   Skin:    General: Skin is warm and dry.     Capillary Refill: Capillary refill takes less than 2 seconds.  Neurological:     Mental Status: She is alert and oriented to person,  place, and time.     Gait: Gait abnormal.     Ortho Exam  Imaging: No results found.  Past Medical/Family/Surgical/Social History: Medications & Allergies reviewed per EMR, Lydia medications updated. Patient Active Problem List   Diagnosis Date Noted   Lumbar radiculopathy 05/08/2022   Hyperlipidemia 05/08/2022   Cellulitis of right forearm 01/22/2022   Sepsis secondary to UTI ([place]) 12/26/2021   Acute pyelonephritis [id number]   Acute metabolic encephalopathy [id number]   Sepsis ([place]) 12/25/2021   AKI (acute kidney injury) ([place])  12/25/2021   Hyponatremia 12/25/2021   Dehydration 12/25/2021   Atrial fibrillation, chronic ([place]) 12/25/2021   Essential hypertension 12/25/2021   GERD (gastroesophageal reflux disease) 12/25/2021   Acquired trigger finger of left middle finger 08/04/2020   Arthritis of hand 08/04/2020   Osteoarthritis of metacarpophalangeal (MCP) joint 08/03/2020   Bilateral hand pain 06/22/2020   Osteoarthritis of hands, 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   Osteoarthritis of feet, 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   PAF (paroxysmal atrial fibrillation) ([facility])    Chronic cough 12/29/2017   Atrial fibrillation with rapid ventricular response ([place]) 12/23/2017   Acute on chronic combined systolic and diastolic CHF (congestive heart failure) ([place])    Menopausal symptom 10/17/2017   Menopausal syndrome 10/17/2017   Carotid stenosis 03/27/2017   S/P total knee arthroplasty, right 10/23/2016   Unilateral primary osteoarthritis, right knee 07/13/2016   Past Medical History:  Diagnosis Date   Arthritis    oa, all over - multiple areas    Carotid artery occlusion    Dysrhythmia    AFib    GERD (gastroesophageal reflux disease)    Headache    h/o migraines    Hypertension    PAF (paroxysmal atrial fibrillation) ([place])    a. multiple DCCV's in 2019 and eventually required initiation of Amiodarone with successful DCCV after this. b. recurrent in 12/2021 while admitted for Urosepsis   Family History  Problem Relation Age of Onset   Stroke Mother    Rheum arthritis Sister    Past Surgical History:  Procedure Laterality Date   ABDOMINAL HYSTERECTOMY     APPENDECTOMY     BACK SURGERY     CARDIOVERSION N/A 12/19/2017   Procedure: CARDIOVERSION;  Surgeon: Arnoldo Lenis, MD;  [facility];  Service: Endoscopy;  Laterality: N/A;   CARDIOVERSION N/A 12/26/2017   Procedure: CARDIOVERSION;  Surgeon: [name], MD;  Location: [facility];  Service: Cardiovascular;  Laterality: N/A;   CARDIOVERSION N/A 01/26/2018    Procedure: CARDIOVERSION;  Surgeon: Jerline Pain, MD;  Location: [facility];  Service: Cardiovascular;  Laterality: N/A;   CAROTID ENDARTERECTOMY     CATARACT EXTRACTION W/PHACO Right 03/09/2021   Procedure: CATARACT EXTRACTION PHACO AND INTRAOCULAR LENS PLACEMENT with Placement of Corticosteroid ([place]);  Surgeon: [name], MD;  Location: [facility];  Service: Ophthalmology;  Laterality: Right;  CDE 9.16   CATARACT EXTRACTION W/PHACO Left 04/02/2021   Procedure: CATARACT EXTRACTION PHACO AND INTRAOCULAR LENS PLACEMENT LEFT EYE;  Surgeon: [name], MD;  Location: [facility];  Service: Ophthalmology;  Laterality: Left;  left CDE=6.75   ENDARTERECTOMY Left 03/27/2017   Procedure: ENDARTERECTOMY CAROTID LEFT;  Surgeon: [name], MD;  Location: [facility];  Service: Vascular;  Laterality: Left;   INNER EAR SURGERY Right    puntured eardrum- repaired 2x's    KNEE ARTHROPLASTY     NASAL SINUS SURGERY     deviated septum   PATCH ANGIOPLASTY Left 03/27/2017   Procedure: PATCH ANGIOPLASTY LEFT CAROTID ARTERY  USING Rueben Bash BIOLOGIC PATCH;  Surgeon: [name], MD;  Location: [facility];  Service: Vascular;  Laterality: Left;   TONSILLECTOMY     TOTAL KNEE ARTHROPLASTY Right 10/23/2016   Procedure: RIGHT TOTAL KNEE ARTHROPLASTY;  Surgeon: [name], MD;  Location: [facility];  Service: Orthopedics;  Laterality: Right;   Social History   Occupational History   Not on file  Tobacco Use   Smoking status: Never   Smokeless tobacco: Never  Vaping Use   Vaping Use: Never used  Substance and Sexual Activity   Alcohol use: No   Drug use: No   Sexual activity: Not Currently    Birth control/protection: Surgical

## 2022-08-29 NOTE — Telephone Encounter (Signed)
Spoke with patient and scheduled OV for 08/29/22

## 2022-08-29 NOTE — Progress Notes (Signed)
Functional Pain Scale - descriptive words and definitions  Distressing (6)    Pain is present/unable to complete most ADLs limited by pain/sleep is difficult and active distraction is only marginal. Moderate range order  Average Pain  varies  Lower back pain. MRI review

## 2022-09-02 ENCOUNTER — Encounter: Payer: Self-pay | Admitting: Physical Therapy

## 2022-09-02 ENCOUNTER — Ambulatory Visit (INDEPENDENT_AMBULATORY_CARE_PROVIDER_SITE_OTHER): Payer: Medicare Other | Admitting: Physical Therapy

## 2022-09-02 DIAGNOSIS — M6281 Muscle weakness (generalized): Secondary | ICD-10-CM | POA: Diagnosis not present

## 2022-09-02 DIAGNOSIS — M542 Cervicalgia: Secondary | ICD-10-CM

## 2022-09-02 DIAGNOSIS — R293 Abnormal posture: Secondary | ICD-10-CM

## 2022-09-02 NOTE — Therapy (Signed)
OUTPATIENT PHYSICAL THERAPY TREATMENT   Patient Name: Lydia Graham MRN: [id number] DOB:August 25, 1935, 87 y.o., female Today's Date: 09/02/2022  END OF SESSION:  PT End of Session - 09/02/22 1145     Visit Number 3    Number of Visits 16    Date for PT Re-Evaluation 10/18/22    PT Start Time [id number]    PT Stop Time 1223    PT Time Calculation (min) 38 min    Activity Tolerance Patient tolerated treatment well    Behavior During Therapy WFL for tasks assessed/performed              Past Medical History:  Diagnosis Date   Arthritis    oa, all over - multiple areas    Carotid artery occlusion    Dysrhythmia    AFib    GERD (gastroesophageal reflux disease)    Headache    h/o migraines    Hypertension    PAF (paroxysmal atrial fibrillation) ([place])    a. multiple DCCV's in 2019 and eventually required initiation of Amiodarone with successful DCCV after this. b. recurrent in 12/2021 while admitted for Urosepsis   Past Surgical History:  Procedure Laterality Date   ABDOMINAL HYSTERECTOMY     APPENDECTOMY     BACK SURGERY     CARDIOVERSION N/A 12/19/2017   Procedure: CARDIOVERSION;  Surgeon: Arnoldo Lenis, MD;  [facility];  Service: Endoscopy;  Laterality: N/A;   CARDIOVERSION N/A 12/26/2017   Procedure: CARDIOVERSION;  Surgeon: [name], MD;  Location: [place];  Service: Cardiovascular;  Laterality: N/A;   CARDIOVERSION N/A 01/26/2018   Procedure: CARDIOVERSION;  Surgeon: Jerline Pain, MD;  Location: [facility] ENDOSCOPY;  Service: Cardiovascular;  Laterality: N/A;   CAROTID ENDARTERECTOMY     CATARACT EXTRACTION W/PHACO Right 03/09/2021   Procedure: CATARACT EXTRACTION PHACO AND INTRAOCULAR LENS PLACEMENT with Placement of Corticosteroid ([place]);  Surgeon: [name], MD;  Location: [facility];  Service: Ophthalmology;  Laterality: Right;  CDE 9.16   CATARACT EXTRACTION W/PHACO Left 04/02/2021   Procedure: CATARACT EXTRACTION PHACO AND INTRAOCULAR LENS  PLACEMENT LEFT EYE;  Surgeon: [name], MD;  Location: [facility];  Service: Ophthalmology;  Laterality: Left;  left CDE=6.75   ENDARTERECTOMY Left 03/27/2017   Procedure: ENDARTERECTOMY CAROTID LEFT;  Surgeon: [name], MD;  Location: [place];  Service: Vascular;  Laterality: Left;   INNER EAR SURGERY Right    puntured eardrum- repaired 2x's    KNEE ARTHROPLASTY     NASAL SINUS SURGERY     deviated septum   PATCH ANGIOPLASTY Left 03/27/2017   Procedure: PATCH ANGIOPLASTY LEFT CAROTID ARTERY USING Rue[employer] Bash BIOLOGIC PATCH;  Surgeon: [name], MD;  Location: [place];  Service: Vascular;  Laterality: Left;   TONSILLECTOMY     TOTAL KNEE ARTHROPLASTY Right 10/23/2016   Procedure: RIGHT TOTAL KNEE ARTHROPLASTY;  Surgeon: [name], MD;  Location: [place];  Service: Orthopedics;  Laterality: Right;   Patient Active Problem List   Diagnosis Date Noted   Lumbar radiculopathy 05/08/2022   Hyperlipidemia 05/08/2022   Cellulitis of right forearm 01/22/2022   Sepsis secondary to UTI ([place]) 12/26/2021   Acute pyelonephritis [id number]   Acute metabolic encephalopathy [id number]   Sepsis ([place]) 12/25/2021   AKI (acute kidney injury) ([place]) 12/25/2021   Hyponatremia 12/25/2021   Dehydration 12/25/2021   Atrial fibrillation, chronic ([place]) 12/25/2021   Essential hypertension 12/25/2021   GERD (gastroesophageal reflux disease) 12/25/2021   Acquired trigger finger  of left middle finger 08/04/2020   Arthritis of hand 08/04/2020   Osteoarthritis of metacarpophalangeal (MCP) joint 08/03/2020   Bilateral hand pain 06/22/2020   Osteoarthritis of hands, 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   Osteoarthritis of feet, 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   PAF (paroxysmal atrial fibrillation) ([facility])    Chronic cough 12/29/2017   Atrial fibrillation with rapid ventricular response ([place]) 12/23/2017   Acute on chronic combined systolic and diastolic CHF (congestive heart failure) ([place])    Menopausal symptom  10/17/2017   Menopausal syndrome 10/17/2017   Carotid stenosis 03/27/2017   S/P total knee arthroplasty, right 10/23/2016   Unilateral primary osteoarthritis, right knee 07/13/2016    PCP: Alvira Monday, FNP   REFERRING PROVIDER:  Lorine Bears, NP   REFERRING DIAG: M54.2 (ICD-10-CM) - Cervicalgia [id number] (ICD-10-CM) - Chronic left shoulder pain M79.18 (ICD-10-CM) - Myofascial pain M47.812 (ICD-10-CM) - Facet arthropathy, cervical  THERAPY DIAG:  Cervicalgia  Muscle weakness (generalized)  Abnormal posture  Rationale for Evaluation and Treatment: Rehabilitation  ONSET DATE: July 2023  SUBJECTIVE:                                                                                                                                                                                                         SUBJECTIVE STATEMENT: Pt stating she has been doing her exercises but they haven't seemed to help.    PERTINENT HISTORY:  Arthritis, GERD, Carotid artery occlusion, HTN, HA, scoloisis of lumbar spine, cardioversion 2019, back surgery, abdominal hysterectomy  PAIN:  NPRS scale: 7-8/10, up to up to 9-10/10 at times Pain location: left sided cervical spine Pain description: achy Aggravating factors: turning head Relieving factors: brace, tylenol  PRECAUTIONS: None  WEIGHT BEARING RESTRICTIONS: No  FALLS:  Has patient fallen in last 6 months? No  LIVING ENVIRONMENT: Lives with: lives with their family and lives alone Lives in: House/apartment Stairs: Yes: External: 2 steps; on left going up Has following equipment at home: Single point cane  OCCUPATION: retired  PLOF: Independent  PATIENT GOALS: Be able to turn my head without moving my entire body. Stop hurting.   Next MD visit:  OBJECTIVE:   DIAGNOSTIC FINDINGS:  MRI 08/26/22 IMPRESSION: 1. Multilevel cervical disc and facet degeneration without spinal stenosis. 2. Moderate right neural foraminal  stenosis at C5-6. 3. Mild-to-moderate right neural foraminal stenosis at C3-4. 4. C1-2 arthropathy with left-sided marrow edema. 5. 1.5 cm incidental right thyroid nodule. Recommend non-emergent thyroid ultrasound   Cervical x-rays exhibits severe multi level degenerative changes, anterolisthesis, disc height loss and prominent facet arthropathy noted at the  level of C5-C6.   IMPRESSION:  1. Lumbar spondylosis and degenerative disc disease, causing  moderate impingement at L3-4 and mild impingement at L2-3 and L4-5,  as detailed above.  2. Levoconvex lumbar scoliosis with rotary component.    PATIENT SURVEYS:  08/19/22: FOTO intake: 54%    COGNITION: Overall cognitive status: Within functional limits for tasks assessed  SENSATION: WFL  POSTURE:  head tilted to the right with left shoulder hiking at rest, forward head and rounded shoulders  PALPATION: TTP: Left upper trap and left levator   CERVICAL ROM:   ROM AROM (deg) 08/19/22  Flexion 18  Extension 8  Right lateral flexion 16  Left lateral flexion 8  Right rotation 25  Left rotation 12   (Blank rows = not tested)  UPPER EXTREMITY ROM:  Active ROM Right 08/19/22 Left 08/19/22  Shoulder flexion 140 144  Shoulder extension    Shoulder abduction 136 130  Shoulder adduction    Shoulder extension    Shoulder internal rotation    Shoulder external rotation    Elbow flexion    Elbow extension    Wrist flexion    Wrist extension    Wrist ulnar deviation    Wrist radial deviation    Wrist pronation    Wrist supination     (Blank rows = not tested)  UPPER EXTREMITY MMT:  MMT  Right eval Left eval  Shoulder flexion 4 3  Shoulder extension    Shoulder abduction    Shoulder adduction    Shoulder extension    Shoulder internal rotation    Shoulder external rotation    Middle trapezius    Lower trapezius    Elbow flexion    Elbow extension    Wrist flexion    Wrist extension    Wrist ulnar deviation     Wrist radial deviation    Wrist pronation    Wrist supination    Grip strength     (Blank rows = not tested)    TODAY'S TREATMENT: DATE:  09/02/22:  Manual:  Gentle occipital release: x 10 STM to bil upper traps and levator TherEx Seated upper trap stretch 3x10 sec bil  Seated levator scapula stretch 3x10 sec bil Scapular retraction 10 x 5 sec hold Supine cervical rotation 10 x 5 sec hold bil Supine cervical retraction c chin tuck 10 x 5 sec hold Modalities:  Moist heat placed on cervical spine during seated stretches for comfort following manual therapy     08/28/22 TherEx Seated upper trap stretch 3x10 sec bil Seated levator scapula stretch 3x10 sec bil Scapular retraction 10 x 5 sec hold Supine cervical rotation 10 x 5 sec hold bil Supine cervical retraction 10 x 5 sec hold     08/19/22: Therex: HEP instruction/performance c cues for techniques, handout provided.  Trial set performed of each for comprehension and symptom assessment.  See below for exercise list Manual:  Skilled palpation of active trigger points during TPDN Trigger Point Dry-Needling  Treatment instructions: Expect mild to moderate muscle soreness. S/S of pneumothorax if dry needled over a lung field, and to seek immediate medical attention should they occur. Patient verbalized understanding of these instructions and education.  Patient Consent Given: Yes Education handout provided: Yes Muscles treated: bilateral cervical paraspinals, left suboccipitals  Treatment response/outcome: twitches noted   PATIENT EDUCATION:  Education details: HEP, POC Person educated: Patient Education method: Consulting civil engineer, Demonstration, Verbal cues, and Handouts Education comprehension: verbalized understanding, returned demonstration, and verbal cues  required  HOME EXERCISE PROGRAM: Access Code: [email or web address] URL: [email or web address] Date: 08/28/2022 Prepared by: Faustino Congress  Exercises -  Seated Upper Trapezius Stretch  - 3 x daily - 7 x weekly - 5 reps - 10 seconds hold - Gentle Levator Scapulae Stretch  - 3 x daily - 7 x weekly - 5 reps - 10 seconds hold - Seated Scapular Retraction  - 3 x daily - 7 x weekly - 10 reps - 10 seconds hold - Supine Cervical Rotation AROM on Pillow  - 3 x daily - 7 x weekly - 1 sets - 10 reps - 5 sec hold - Supine Chin Tuck  - 3 x daily - 7 x weekly - 1 sets - 10 reps - 5 sec hold  ASSESSMENT:  CLINICAL IMPRESSION: Pt arriving today reporting 7-8/10 pain upon arrival in her cervical spine. Pt stating turning her head a church to view her pastor at church was extremely painful. Pt tolerating manual therapy well. Pt with noted atrophy in her left sided shoulder muscles when compared to her right. Pt stated she has a heated massager at home and was encouraged to use it as needed. Continue skilled PT to maximize pt's function  OBJECTIVE IMPAIRMENTS: decreased ROM, decreased strength, impaired UE functional use, postural dysfunction, and pain.   ACTIVITY LIMITATIONS: lifting, sitting, sleeping, dressing, and reach over head  PARTICIPATION LIMITATIONS: community activity and church  PERSONAL FACTORS: 3+ comorbidities: see above  are also affecting patient's functional outcome.   REHAB POTENTIAL: Good  CLINICAL DECISION MAKING: Stable/uncomplicated  EVALUATION COMPLEXITY: Low   GOALS: Goals reviewed with patient? Yes  SHORT TERM GOALS: (target date for Short term goals are 3 weeks 09/13/22)  1.Patient will demonstrate independent use of home exercise program to maintain progress from in clinic treatments. Goal status: On-going 09/02/22  LONG TERM GOALS: (target dates for all long term goals are 8 weeks  10/18/22 )   1. Patient will demonstrate/report pain at worst less than or equal to 2/10 to facilitate minimal limitation in daily activity secondary to pain symptoms. Goal status: New   2. Patient will demonstrate independent use of home  exercise program to facilitate ability to maintain/progress functional gains from skilled physical therapy services. Goal status: New   3. Patient will demonstrate FOTO outcome > or = 59 % to indicate reduced disability due to condition. Goal status: New   4.  Patient will demonstrate cervical AROM WFL s symptoms to facilitate usual head movements for daily activity including driving, self care.   Goal status: New   5.  Pt will improve her cervical rotation to >/=  40 degrees bilaterally to help improve driving and functional mobility.  Goal status: New   6.  Pt will be able to lift 3# from counter to overhead shelf with no pain reported.  Goal status: New      PLAN:  PT FREQUENCY: 1-2x/week  PT DURATION: 8 weeks  PLANNED INTERVENTIONS: Therapeutic exercises, Therapeutic activity, Neuro Muscular re-education, Balance training, Gait training, Patient/Family education, Joint mobilization, Stair training, DME instructions, Dry Needling, Electrical stimulation, Cryotherapy, vasopneumatic device,Traction, Moist heat, Taping, Ultrasound, Ionotophoresis '4mg'$ /ml Dexamethasone, and Manual therapy.  All included unless contraindicated  PLAN FOR NEXT SESSION: Repeat DN if indicated, Review HEP use/response, postural exercises, manual as needed No estim until work up for thyroid nodule determined benign.     Kearney Hard, PT, MPT 09/02/22 12:34 PM   09/02/22 11:50 AM

## 2022-09-03 ENCOUNTER — Ambulatory Visit (INDEPENDENT_AMBULATORY_CARE_PROVIDER_SITE_OTHER): Payer: Medicare Other | Admitting: Family Medicine

## 2022-09-03 ENCOUNTER — Encounter: Payer: Self-pay | Admitting: Family Medicine

## 2022-09-03 VITALS — BP 128/72 | HR 75 | Ht 67.0 in | Wt 153.1 lb

## 2022-09-03 DIAGNOSIS — E559 Vitamin D deficiency, unspecified: Secondary | ICD-10-CM | POA: Diagnosis not present

## 2022-09-03 DIAGNOSIS — K219 Gastro-esophageal reflux disease without esophagitis: Secondary | ICD-10-CM

## 2022-09-03 DIAGNOSIS — E041 Nontoxic single thyroid nodule: Secondary | ICD-10-CM | POA: Diagnosis not present

## 2022-09-03 DIAGNOSIS — R7301 Impaired fasting glucose: Secondary | ICD-10-CM | POA: Diagnosis not present

## 2022-09-03 DIAGNOSIS — E0789 Other specified disorders of thyroid: Secondary | ICD-10-CM | POA: Diagnosis not present

## 2022-09-03 DIAGNOSIS — M542 Cervicalgia: Secondary | ICD-10-CM

## 2022-09-03 DIAGNOSIS — E7849 Other hyperlipidemia: Secondary | ICD-10-CM | POA: Diagnosis not present

## 2022-09-03 DIAGNOSIS — I10 Essential (primary) hypertension: Secondary | ICD-10-CM

## 2022-09-03 MED ORDER — BACLOFEN 10 MG PO TABS: 10.0000 mg | ORAL_TABLET | Freq: Every evening | ORAL | 0 refills | Status: DC | PRN

## 2022-09-03 NOTE — Patient Instructions (Signed)
I appreciate the opportunity to provide care to you today!    Follow up:  4 months  Labs: please stop by the lab during the week to get your blood drawn (CBC, CMP, TSH, Lipid profile, HgA1c, Vit D)  Please pick up your medication at the pharmacy and start therapy  Please pick up your prescription of baclofen 10 mg to take as nighttime as needed Baclofen can cause drowsiness, I recommend taking at bedtime only Please follow-up with orthopedics as indicated   Apply heat to the affected area such as a moist heat pack or a heating pad. Place a towel between your skin and the heat source. Leave the heat on for 20-30 minutes. Remove the heat if your skin turns bright red. This is especially important if you are unable to feel pain, heat, or cold. You may have a greater risk of getting burned.    Please continue to a heart-healthy diet and increase your physical activities. Try to exercise for 47mns at least five times a week.      It was a pleasure to see you and I look forward to continuing to work together on your health and well-being. Please do not hesitate to call the office if you need care or have questions about your care.   Have a wonderful day and week. With Gratitude, GAlvira MondayMSN, FNP-BC

## 2022-09-03 NOTE — Assessment & Plan Note (Signed)
She is prescribed pantoprazole 40 mg twice daily, she reports only taking pantoprazole 40 mg once daily Encouraged to continue taking Protonix 40 mg daily  

## 2022-09-03 NOTE — Assessment & Plan Note (Signed)
Denies symptoms of hoarseness, difficulty swallowing, and swelling around the neck No current tolerance or irregular heartbeat reported Will obtain an ultrasound of the thyroid gland for further evaluation of thyroid nodule seen on her recent MRI

## 2022-09-03 NOTE — Progress Notes (Signed)
Established Patient Office Visit  Subjective:  Patient ID: Lydia Graham, female    DOB: 1936-04-12  Age: 87 y.o. MRN: [id number]  CC:  Chief Complaint  Patient presents with   Follow-up    Pt had mri of her neck, may need ultrasound, reports neck pain today, that is affecting her sleep.     HPI Lydia Graham is a 87 y.o. female with past medical history of GERD, essential hypertension and hyperlipidemia presents for f/u of  chronic medical conditions. For the details of today's visit, please refer to the assessment and plan.     Past Medical History:  Diagnosis Date   Arthritis    oa, all over - multiple areas    Carotid artery occlusion    Dysrhythmia    AFib    GERD (gastroesophageal reflux disease)    Headache    h/o migraines    Hypertension    PAF (paroxysmal atrial fibrillation) ([place])    a. multiple DCCV's in 2019 and eventually required initiation of Amiodarone with successful DCCV after this. b. recurrent in 12/2021 while admitted for Urosepsis    Past Surgical History:  Procedure Laterality Date   ABDOMINAL HYSTERECTOMY     APPENDECTOMY     BACK SURGERY     CARDIOVERSION N/A 12/19/2017   Procedure: CARDIOVERSION;  Surgeon: Arnoldo Lenis, MD;  [facility];  Service: Endoscopy;  Laterality: N/A;   CARDIOVERSION N/A 12/26/2017   Procedure: CARDIOVERSION;  Surgeon: [name], MD;  Location: [place];  Service: Cardiovascular;  Laterality: N/A;   CARDIOVERSION N/A 01/26/2018   Procedure: CARDIOVERSION;  Surgeon: Jerline Pain, MD;  Location: [facility] ENDOSCOPY;  Service: Cardiovascular;  Laterality: N/A;   CAROTID ENDARTERECTOMY     CATARACT EXTRACTION W/PHACO Right 03/09/2021   Procedure: CATARACT EXTRACTION PHACO AND INTRAOCULAR LENS PLACEMENT with Placement of Corticosteroid ([place]);  Surgeon: [name], MD;  Location: [facility];  Service: Ophthalmology;  Laterality: Right;  CDE 9.16   CATARACT EXTRACTION W/PHACO Left 04/02/2021    Procedure: CATARACT EXTRACTION PHACO AND INTRAOCULAR LENS PLACEMENT LEFT EYE;  Surgeon: [name], MD;  [facility]: [facility];  Service: Ophthalmology;  Laterality: Left;  left CDE=6.75   ENDARTERECTOMY Left 03/27/2017   Procedure: ENDARTERECTOMY CAROTID LEFT;  Surgeon: [name], MD;  Location: [place];  Service: Vascular;  Laterality: Left;   INNER EAR SURGERY Right    puntured eardrum- repaired 2x's    KNEE ARTHROPLASTY     NASAL SINUS SURGERY     deviated septum   PATCH ANGIOPLASTY Left 03/27/2017   Procedure: PATCH ANGIOPLASTY LEFT CAROTID ARTERY USING Rueben Bash BIOLOGIC PATCH;  Surgeon: [name], MD;  Location: [place];  Service: Vascular;  Laterality: Left;   TONSILLECTOMY     TOTAL KNEE ARTHROPLASTY Right 10/23/2016   Procedure: RIGHT TOTAL KNEE ARTHROPLASTY;  Surgeon: [name], MD;  Location: [place];  Service: Orthopedics;  Laterality: Right;    Family History  Problem Relation Age of Onset   Stroke Mother    Rheum arthritis Sister     Social History   Socioeconomic History   Marital status: Single    Spouse name: Not on file   Number of children: Not on file   Years of education: Not on file   Highest education level: Not on file  Occupational History   Not on file  Tobacco Use   Smoking status: Never   Smokeless tobacco: Never  Vaping Use  Vaping Use: Never used  Substance and Sexual Activity   Alcohol use: No   Drug use: No   Sexual activity: Not Currently    Birth control/protection: Surgical  Other Topics Concern   Not on file  Social History Narrative   Not on file   Social Determinants of Health   Financial Resource Strain: Low Risk  (06/28/2022)   Overall Financial Resource Strain (CARDIA)    Difficulty of Paying Living Expenses: Not hard at all  Food Insecurity: No Food Insecurity (06/28/2022)   Hunger Vital Sign    Worried About Running Out of Food in the Last Year: Never true    Ran Out of Food in the Last Year:  Never true  Transportation Needs: No Transportation Needs (06/28/2022)   PRAPARE - Hydrologist (Medical): No    Lack of Transportation (Non-Medical): No  Physical Activity: Sufficiently Active (06/28/2022)   Exercise Vital Sign    Days of Exercise per Week: 3 days    Minutes of Exercise per Session: 50 min  Stress: No Stress Concern Present (06/28/2022)   [place]    Feeling of Stress : Not at all  Social Connections: Moderately Integrated (06/28/2022)   Social Connection and Isolation Panel [NHANES]    Frequency of Communication with Friends and Family: More than three times a week    Frequency of Social Gatherings with Friends and Family: More than three times a week    Attends Religious Services: More than 4 times per year    Active Member of [employer] or Organizations: Yes    Attends Archivist Meetings: More than 4 times per year    Marital Status: Widowed  Intimate Partner Violence: Not At Risk (06/28/2022)   Humiliation, Afraid, Rape, and Kick questionnaire    Fear of Current or Ex-Partner: No    Emotionally Abused: No    Physically Abused: No    Sexually Abused: No    Outpatient Medications Prior to Visit  Medication Sig Dispense Refill   amiodarone (PACERONE) 200 MG tablet Take 0.5 tablets (100 mg total) by mouth daily. Till 02/02/22, then decrease to every other day - 01/04/2022     amLODipine (NORVASC) 5 MG tablet TAKE 1 TABLET AT 8AM AND 1 TABLET AT 8PM. 60 tablet 6   apixaban (ELIQUIS) 5 MG TABS tablet Take 1 tablet (5 mg total) by mouth 2 (two) times daily. 42 tablet 0   Calcium Carbonate-Vitamin D (CALCIUM 600+D PO) Take 1 tablet by mouth daily.     Camphor-Menthol-Methyl Sal (SALONPAS) 3.06-29-08 % PTCH Place 1 patch onto the skin at bedtime as needed (foot pain).     estradiol (ESTRACE) 0.5 MG tablet Take 0.5 mg by mouth daily.      ezetimibe (ZETIA) 10 MG tablet Take 1 tablet  (10 mg total) by mouth daily. 30 tablet 0   fluticasone (FLONASE) 50 MCG/ACT nasal spray Place 2 sprays into both nostrils daily as needed for allergies.      furosemide (LASIX) 20 MG tablet Take 1 tablet (20 mg total) by mouth as needed for fluid or edema. 30 tablet 2   Hypromellose (ARTIFICIAL TEARS OP) Place 2 drops into both eyes 2 (two) times daily as needed (for dry eyes).     losartan (COZAAR) 50 MG tablet Take 1 tablet (50 mg total) by mouth 2 (two) times daily. 60 tablet 6   magnesium hydroxide (MILK OF MAGNESIA) 400  MG/5ML suspension Take 30 mLs by mouth daily as needed for mild constipation.     metoprolol succinate (TOPROL-XL) 50 MG 24 hr tablet TAKE 1 TABLET AT LUNCH OR IMMEDIATELY FOLLOWING A MEAL. 30 tablet 6   mupirocin ointment (BACTROBAN) 2 % Apply 1 Application topically daily. 22 g 0   pantoprazole (PROTONIX) 40 MG tablet TAKE (1) TABLET TWICE DAILY. 60 tablet 6   potassium chloride SA (KLOR-CON M) 20 MEQ tablet Take 1 tablet (20 mEq total) by mouth as needed (take on days you take your Furosemide). 30 tablet 2   vitamin B-12 (CYANOCOBALAMIN) 1000 MCG tablet Take 1,000 mcg by mouth daily.     No facility-administered medications prior to visit.    Allergies  Allergen Reactions   Rosuvastatin Other (See Comments)    MYALGIA, Muscle pain   Lisinopril Cough   Other Other (See Comments)    PAIN MEDICATIONS/ANESTHESIA  MADE BP DROP LOW    Singulair [Montelukast Sodium] Other (See Comments)    fatigue    ROS Review of Systems  Constitutional:  Negative for chills and fever.  Eyes:  Negative for visual disturbance.  Respiratory:  Negative for chest tightness and shortness of breath.   Musculoskeletal:  Positive for neck pain (left sided).  Neurological:  Negative for dizziness and headaches.      Objective:    Physical Exam HENT:     Head: Normocephalic.     Mouth/Throat:     Mouth: Mucous membranes are moist.  Neck:     Thyroid: No thyromegaly or thyroid  tenderness.  Cardiovascular:     Rate and Rhythm: Normal rate.     Heart sounds: Normal heart sounds.  Pulmonary:     Effort: Pulmonary effort is normal.     Breath sounds: Normal breath sounds.  Musculoskeletal:     Cervical back: Tenderness (limited ROM with left neck flexion/mild tenderness with palpation) present.  Neurological:     Mental Status: She is alert.     BP 128/72 (BP Location: Left Arm)   Pulse 75   Ht '5\' 7"'$  (1.702 m)   Wt 153 lb 1.9 oz (69.5 kg)   LMP  (LMP Unknown)   SpO2 93%   BMI 23.98 kg/m  Wt Readings from Last 3 Encounters:  09/03/22 153 lb 1.9 oz (69.5 kg)  06/13/22 151 lb (68.5 kg)  05/08/22 154 lb (69.9 kg)    Lab Results  Component Value Date   TSH 3.190 05/21/2022   Lab Results  Component Value Date   WBC 8.0 05/21/2022   HGB 13.4 05/21/2022   HCT 41.0 05/21/2022   MCV 95 05/21/2022   PLT 370 05/21/2022   Lab Results  Component Value Date   NA 141 05/21/2022   K 4.4 05/21/2022   CO2 19 (L) 05/21/2022   GLUCOSE 89 05/21/2022   BUN 21 05/21/2022   CREATININE 1.22 (H) 05/21/2022   BILITOT 0.7 05/21/2022   ALKPHOS 76 05/21/2022   AST 18 05/21/2022   ALT 8 05/21/2022   PROT 7.0 05/21/2022   ALBUMIN 4.4 05/21/2022   CALCIUM 9.9 05/21/2022   ANIONGAP 11 01/02/2022   EGFR 44 (L) 05/21/2022   Lab Results  Component Value Date   CHOL 219 (H) 05/21/2022   Lab Results  Component Value Date   HDL 54 05/21/2022   Lab Results  Component Value Date   LDLCALC 130 (H) 05/21/2022   Lab Results  Component Value Date   TRIG 200 (H) 05/21/2022  Lab Results  Component Value Date   CHOLHDL 4.1 05/21/2022   Lab Results  Component Value Date   HGBA1C 5.3 05/21/2022      Assessment & Plan:  Thyroid nodule greater than or equal to 1.5 cm in diameter incidentally noted on imaging study Assessment & Plan: Denies symptoms of hoarseness, difficulty swallowing, and swelling around the neck No current tolerance or irregular heartbeat  reported Will obtain an ultrasound of the thyroid gland for further evaluation of thyroid nodule seen on her recent MRI  Orders: -     US THYROID  Myofascial neck pain Assessment & Plan:  Chronic condition No red flag symptoms reported Complains of left-sided neck pain She follows up with orthopedics and is awaiting approval from her insurance for cervical facet joint injections Pain is rated 7-8 out of 10 Limited Range of motion with left-sided flexion of her neck We will treat today with baclofen 10 mg nightly as needed Informed that baclofen can cause drowsiness, therefore the encouraged to take the medication at bedtime Encourage heat applications to the affected side Encouraged to follow up with orthopedics for worsening  symptoms  Orders: -     Baclofen; Take 1 tablet (10 mg total) by mouth at bedtime as needed for muscle spasms.  Dispense: 15 each; Refill: 0  Gastroesophageal reflux disease without esophagitis Assessment & Plan: She is prescribed pantoprazole 40 mg twice daily, she reports only taking pantoprazole 40 mg once daily Encouraged to continue taking Protonix 40 mg daily    Essential hypertension Assessment & Plan: Controlled Encouraged to continue taking  losartan 50 mg 1 tablet in the morning and 1 tablet in the evening and amlodipine 5 mg twice daily BP Readings from Last 3 Encounters:  09/03/22 128/72  06/13/22 (!) 151/70  06/03/22 (!) 153/70     Orders: -     CBC with Differential/Platelet -     CMP14+EGFR  Other hyperlipidemia -     Lipid panel  Vitamin D deficiency -     VITAMIN D 25 Hydroxy (Vit-D Deficiency, Fractures)  Other specified disorders of thyroid -     TSH + free T4  Impaired fasting blood sugar -     Hemoglobin A1c    Follow-up: Return in about 4 months (around 01/03/2023).   Alvira Monday, FNP

## 2022-09-03 NOTE — Assessment & Plan Note (Signed)
  Chronic condition No red flag symptoms reported Complains of left-sided neck pain She follows up with orthopedics and is awaiting approval from her insurance for cervical facet joint injections Pain is rated 7-8 out of 10 Limited Range of motion with left-sided flexion of her neck We will treat today with baclofen 10 mg nightly as needed Informed that baclofen can cause drowsiness, therefore the encouraged to take the medication at bedtime Encourage heat applications to the affected side Encouraged to follow up with orthopedics for worsening  symptoms

## 2022-09-03 NOTE — Assessment & Plan Note (Signed)
Controlled Encouraged to continue taking  losartan 50 mg 1 tablet in the morning and 1 tablet in the evening and amlodipine 5 mg twice daily BP Readings from Last 3 Encounters:  09/03/22 128/72  06/13/22 (!) 151/70  06/03/22 (!) 153/70

## 2022-09-04 ENCOUNTER — Encounter: Payer: Medicare Other | Admitting: Physical Therapy

## 2022-09-05 ENCOUNTER — Ambulatory Visit: Payer: Medicare Other | Attending: Cardiovascular Disease | Admitting: Cardiovascular Disease

## 2022-09-05 ENCOUNTER — Encounter: Payer: Self-pay | Admitting: Cardiovascular Disease

## 2022-09-05 VITALS — BP 130/74 | HR 69 | Ht 67.0 in | Wt 151.0 lb

## 2022-09-05 DIAGNOSIS — I10 Essential (primary) hypertension: Secondary | ICD-10-CM | POA: Insufficient documentation

## 2022-09-05 DIAGNOSIS — I480 Paroxysmal atrial fibrillation: Secondary | ICD-10-CM | POA: Diagnosis not present

## 2022-09-05 MED ORDER — AMIODARONE HCL 200 MG PO TABS: 100.0000 mg | ORAL_TABLET | ORAL | 3 refills | Status: DC

## 2022-09-05 NOTE — Patient Instructions (Signed)
Medication Instructions:  Your physician recommends that you continue on your current medications as directed. Please refer to the Current Medication list given to you today.  *If you need a refill on your cardiac medications before your next appointment, please call your pharmacy*  Lab Work: None ordered  Testing/Procedures: None ordered  Follow-Up: At [facility], you and your health needs are our priority.  As part of our continuing mission to provide you with exceptional heart care, we have created designated Provider Care Teams.  These Care Teams include your primary Cardiologist (physician) and Advanced Practice Providers (APPs -  Physician Assistants and Nurse Practitioners) who all work together to provide you with the care you need, when you need it.  Your next appointment:   1 year(s)  The format for your next appointment:   In Person  Provider:   You may see Augustus E Mealor, MD or one of the following Advanced Practice Providers on your designated Care Team:   Renee Ursuy, PA-C Michael "Andy" Tillery, PA-C Suzann Riddle, NP{  

## 2022-09-05 NOTE — Progress Notes (Signed)
PCP: Lydia Monday, FNP   Primary EP: Dr Myles Gip  Lydia Graham is a 87 y.o. female who presents today for routine electrophysiology followup.  Since last being seen in our clinic, the patient reports doing very well.  Today, she denies symptoms of palpitations, chest pain, shortness of breath,  lower extremity edema, dizziness, presyncope, or syncope.  The patient is otherwise without complaint today.   Past Medical History:  Diagnosis Date   Arthritis    oa, all over - multiple areas    Carotid artery occlusion    Dysrhythmia    AFib    GERD (gastroesophageal reflux disease)    Headache    h/o migraines    Hypertension    PAF (paroxysmal atrial fibrillation) ([place])    a. multiple DCCV's in 2019 and eventually required initiation of Amiodarone with successful DCCV after this. b. recurrent in 12/2021 while admitted for Urosepsis   Past Surgical History:  Procedure Laterality Date   ABDOMINAL HYSTERECTOMY     APPENDECTOMY     BACK SURGERY     CARDIOVERSION N/A 12/19/2017   Procedure: CARDIOVERSION;  Surgeon: Arnoldo Lenis, MD;  [facility];  Service: Endoscopy;  Laterality: N/A;   CARDIOVERSION N/A 12/26/2017   Procedure: CARDIOVERSION;  Surgeon: [name], MD;  Location: [place];  Service: Cardiovascular;  Laterality: N/A;   CARDIOVERSION N/A 01/26/2018   Procedure: CARDIOVERSION;  Surgeon: Jerline Pain, MD;  Location: [facility] ENDOSCOPY;  Service: Cardiovascular;  Laterality: N/A;   CAROTID ENDARTERECTOMY     CATARACT EXTRACTION W/PHACO Right 03/09/2021   Procedure: CATARACT EXTRACTION PHACO AND INTRAOCULAR LENS PLACEMENT with Placement of Corticosteroid ([place]);  Surgeon: [name], MD;  [facility];  Service: Ophthalmology;  Laterality: Right;  CDE 9.16   CATARACT EXTRACTION W/PHACO Left 04/02/2021   Procedure: CATARACT EXTRACTION PHACO AND INTRAOCULAR LENS PLACEMENT LEFT EYE;  Surgeon: [name], MD;  [facility];  Service:  Ophthalmology;  Laterality: Left;  left CDE=6.75   ENDARTERECTOMY Left 03/27/2017   Procedure: ENDARTERECTOMY CAROTID LEFT;  Surgeon: [name], MD;  Location: [place];  Service: Vascular;  Laterality: Left;   INNER EAR SURGERY Right    puntured eardrum- repaired 2x's    KNEE ARTHROPLASTY     NASAL SINUS SURGERY     deviated septum   PATCH ANGIOPLASTY Left 03/27/2017   Procedure: PATCH ANGIOPLASTY LEFT CAROTID ARTERY USING Rueben Bash BIOLOGIC PATCH;  Surgeon: [name], MD;  Location: [place];  Service: Vascular;  Laterality: Left;   TONSILLECTOMY     TOTAL KNEE ARTHROPLASTY Right 10/23/2016   Procedure: RIGHT TOTAL KNEE ARTHROPLASTY;  Surgeon: [name], MD;  Location: [place];  Service: Orthopedics;  Laterality: Right;    ROS- all systems are reviewed and negatives except as per HPI above  Current Outpatient Medications  Medication Sig Dispense Refill   amiodarone (PACERONE) 200 MG tablet Take 0.5 tablets (100 mg total) by mouth daily. Till 02/02/22, then decrease to every other day - 01/04/2022     amLODipine (NORVASC) 5 MG tablet TAKE 1 TABLET AT 8AM AND 1 TABLET AT 8PM. 60 tablet 6   apixaban (ELIQUIS) 5 MG TABS tablet Take 1 tablet (5 mg total) by mouth 2 (two) times daily. 42 tablet 0   baclofen (LIORESAL) 10 MG tablet Take 1 tablet (10 mg total) by mouth at bedtime as needed for muscle spasms. 15 each 0   Calcium Carbonate-Vitamin D (CALCIUM 600+D PO) Take  1 tablet by mouth daily.     Camphor-Menthol-Methyl Sal (SALONPAS) 3.06-29-08 % PTCH Place 1 patch onto the skin at bedtime as needed (foot pain).     estradiol (ESTRACE) 0.5 MG tablet Take 0.5 mg by mouth daily.      fluticasone (FLONASE) 50 MCG/ACT nasal spray Place 2 sprays into both nostrils daily as needed for allergies.      furosemide (LASIX) 20 MG tablet Take 1 tablet (20 mg total) by mouth as needed for fluid or edema. 30 tablet 2   Hypromellose (ARTIFICIAL TEARS OP) Place 2 drops into both eyes 2  (two) times daily as needed (for dry eyes).     losartan (COZAAR) 50 MG tablet Take 1 tablet (50 mg total) by mouth 2 (two) times daily. 60 tablet 6   magnesium hydroxide (MILK OF MAGNESIA) 400 MG/5ML suspension Take 30 mLs by mouth daily as needed for mild constipation.     metoprolol succinate (TOPROL-XL) 50 MG 24 hr tablet TAKE 1 TABLET AT LUNCH OR IMMEDIATELY FOLLOWING A MEAL. 30 tablet 6   pantoprazole (PROTONIX) 40 MG tablet TAKE (1) TABLET TWICE DAILY. 60 tablet 6   potassium chloride SA (KLOR-CON M) 20 MEQ tablet Take 1 tablet (20 mEq total) by mouth as needed (take on days you take your Furosemide). 30 tablet 2   vitamin B-12 (CYANOCOBALAMIN) 1000 MCG tablet Take 1,000 mcg by mouth daily.     ezetimibe (ZETIA) 10 MG tablet Take 1 tablet (10 mg total) by mouth daily. (Patient not taking: Reported on 09/05/2022) 30 tablet 0   mupirocin ointment (BACTROBAN) 2 % Apply 1 Application topically daily. (Patient not taking: Reported on 09/05/2022) 22 g 0   No current facility-administered medications for this visit.    Physical Exam: Vitals:   09/05/22 1030  BP: 130/74  Pulse: 69  SpO2: 97%  Weight: 151 lb (68.5 kg)  Height: '5\' 7"'$  (1.702 m)     GEN- The patient is well appearing, alert and oriented x 3 today.   Head- normocephalic, atraumatic Eyes-  Sclera clear, conjunctiva pink Ears- hearing intact Oropharynx- clear Lungs- Clear to ausculation bilaterally, normal work of breathing Heart- Regular rate and rhythm, no murmurs, rubs or gallops, PMI not laterally displaced GI- soft, NT, ND, + BS Extremities- no clubbing, cyanosis, or edema  Wt Readings from Last 3 Encounters:  09/05/22 151 lb (68.5 kg)  09/03/22 153 lb 1.9 oz (69.5 kg)  06/13/22 151 lb (68.5 kg)    EKG tracing ordered today is personally reviewed and shows sinus with LBBB  Assessment and Plan:  Persistent atrial fibrillation Doing well with amiodarone '100mg'$  QOD - will refill today She is on eliquis for stroke  prevention (chads2vasc score is 5) Labs 05/21/22 reviewed personally. Repeat CMP and TSH are ordered  2. HTN Stable No change required today Labs 07/28/20 reviewed  3. Chronic diastolic dysfunction Stable No change required today  4. S/p prior CEA for L ICA stenosis in 2018 Stable No change required today Follows with Vascular surgery  5. Moderate MR Follows with Dr Harl Bowie His notes reviewed MR mild by echo 03/28/22 (reviewed)  Risks, benefits and potential toxicities for medications prescribed and/or refilled reviewed with patient today.   Return in a year to see me Follow-up with Dr Harl Bowie in the interim  Melida Quitter, MD 09/05/2022 10:44 AM

## 2022-09-06 DIAGNOSIS — R7301 Impaired fasting glucose: Secondary | ICD-10-CM | POA: Diagnosis not present

## 2022-09-06 DIAGNOSIS — E0789 Other specified disorders of thyroid: Secondary | ICD-10-CM | POA: Diagnosis not present

## 2022-09-06 DIAGNOSIS — E559 Vitamin D deficiency, unspecified: Secondary | ICD-10-CM | POA: Diagnosis not present

## 2022-09-06 DIAGNOSIS — E7849 Other hyperlipidemia: Secondary | ICD-10-CM | POA: Diagnosis not present

## 2022-09-06 DIAGNOSIS — I10 Essential (primary) hypertension: Secondary | ICD-10-CM | POA: Diagnosis not present

## 2022-09-07 LAB — CBC WITH DIFFERENTIAL/PLATELET
Basophils Absolute: 0.1 10*3/uL (ref 0.0–0.2)
Basos: 1 %
EOS (ABSOLUTE): 0.4 10*3/uL (ref 0.0–0.4)
Eos: 5 %
Hematocrit: 38.8 % (ref 34.0–46.6)
Hemoglobin: 12.8 g/dL (ref 11.1–15.9)
Immature Grans (Abs): 0 10*3/uL (ref 0.0–0.1)
Immature Granulocytes: 0 %
Lymphocytes Absolute: 2 10*3/uL (ref 0.7–3.1)
Lymphs: 23 %
MCH: 29.9 pg (ref 26.6–33.0)
MCHC: 33 g/dL (ref 31.5–35.7)
MCV: 91 fL (ref 79–97)
Monocytes Absolute: 0.8 10*3/uL (ref 0.1–0.9)
Monocytes: 9 %
Neutrophils Absolute: 5.5 10*3/uL (ref 1.4–7.0)
Neutrophils: 62 %
Platelets: 398 10*3/uL (ref 150–450)
RBC: 4.28 x10E6/uL (ref 3.77–5.28)
RDW: 12.1 % (ref 11.7–15.4)
WBC: 8.8 10*3/uL (ref 3.4–10.8)

## 2022-09-07 LAB — CMP14+EGFR
ALT: 8 IU/L (ref 0–32)
AST: 16 IU/L (ref 0–40)
Albumin/Globulin Ratio: 1.8 (ref 1.2–2.2)
Albumin: 4.2 g/dL (ref 3.7–4.7)
Alkaline Phosphatase: 91 IU/L (ref 44–121)
BUN/Creatinine Ratio: 19 (ref 12–28)
BUN: 23 mg/dL (ref 8–27)
Bilirubin Total: 0.4 mg/dL (ref 0.0–1.2)
CO2: 21 mmol/L (ref 20–29)
Calcium: 9.2 mg/dL (ref 8.7–10.3)
Chloride: 100 mmol/L (ref 96–106)
Creatinine, Ser: 1.23 mg/dL — ABNORMAL HIGH (ref 0.57–1.00)
Globulin, Total: 2.4 g/dL (ref 1.5–4.5)
Glucose: 94 mg/dL (ref 70–99)
Potassium: 4.8 mmol/L (ref 3.5–5.2)
Sodium: 136 mmol/L (ref 134–144)
Total Protein: 6.6 g/dL (ref 6.0–8.5)
eGFR: 43 mL/min/{1.73_m2} — ABNORMAL LOW (ref >59)

## 2022-09-07 LAB — HEMOGLOBIN A1C
Est. average glucose Bld gHb Est-mCnc: 114 mg/dL
Hgb A1c MFr Bld: 5.6 % (ref 4.8–5.6)

## 2022-09-07 LAB — LIPID PANEL
Chol/HDL Ratio: 3.8 ratio (ref 0.0–4.4)
Cholesterol, Total: 185 mg/dL (ref 100–199)
HDL: 49 mg/dL (ref >39)
LDL Chol Calc (NIH): 114 mg/dL — ABNORMAL HIGH (ref 0–99)
Triglycerides: 120 mg/dL (ref 0–149)
VLDL Cholesterol Cal: 22 mg/dL (ref 5–40)

## 2022-09-07 LAB — TSH+FREE T4
Free T4: 1.13 ng/dL (ref 0.82–1.77)
TSH: 3.83 u[IU]/mL (ref 0.450–4.500)

## 2022-09-07 LAB — VITAMIN D 25 HYDROXY (VIT D DEFICIENCY, FRACTURES): Vit D, 25-Hydroxy: 42 ng/mL (ref 30.0–100.0)

## 2022-09-08 NOTE — Progress Notes (Signed)
Please encourage the patient to increase her fluid intake to at least 64 ounces daily. Her cholesterol levels have improved, and I recommend she continue taking ezemibe 10 mg daily. All other labs are stable.

## 2022-09-09 ENCOUNTER — Encounter: Payer: Medicare Other | Admitting: Physical Medicine and Rehabilitation

## 2022-09-09 ENCOUNTER — Ambulatory Visit (INDEPENDENT_AMBULATORY_CARE_PROVIDER_SITE_OTHER): Payer: Medicare Other | Admitting: Physical Therapy

## 2022-09-09 ENCOUNTER — Encounter: Payer: Self-pay | Admitting: Physical Therapy

## 2022-09-09 DIAGNOSIS — M6281 Muscle weakness (generalized): Secondary | ICD-10-CM

## 2022-09-09 DIAGNOSIS — M542 Cervicalgia: Secondary | ICD-10-CM

## 2022-09-09 DIAGNOSIS — R293 Abnormal posture: Secondary | ICD-10-CM | POA: Diagnosis not present

## 2022-09-09 NOTE — Therapy (Signed)
OUTPATIENT PHYSICAL THERAPY TREATMENT   Patient Name: Lydia Graham MRN: [id number] DOB:1935-12-31, 87 y.o., female Today's Date: 09/09/2022  END OF SESSION:  PT End of Session - 09/09/22 0920     Visit Number 4    Number of Visits 16    Date for PT Re-Evaluation 10/18/22    PT Start Time 0850    PT Stop Time 0930    PT Time Calculation (min) 40 min    Activity Tolerance Patient tolerated treatment well    Behavior During Therapy [facility] for tasks assessed/performed              Past Medical History:  Diagnosis Date   Arthritis    oa, all over - multiple areas    Carotid artery occlusion    Dysrhythmia    AFib    GERD (gastroesophageal reflux disease)    Headache    h/o migraines    Hypertension    PAF (paroxysmal atrial fibrillation) ([place])    a. multiple DCCV's in 2019 and eventually required initiation of Amiodarone with successful DCCV after this. b. recurrent in 12/2021 while admitted for Urosepsis   Past Surgical History:  Procedure Laterality Date   ABDOMINAL HYSTERECTOMY     APPENDECTOMY     BACK SURGERY     CARDIOVERSION N/A 12/19/2017   Procedure: CARDIOVERSION;  Surgeon: Arnoldo Lenis, MD;  [facility];  Service: Endoscopy;  Laterality: N/A;   CARDIOVERSION N/A 12/26/2017   Procedure: CARDIOVERSION;  Surgeon: [name], MD;  Location: [place];  Service: Cardiovascular;  Laterality: N/A;   CARDIOVERSION N/A 01/26/2018   Procedure: CARDIOVERSION;  Surgeon: Jerline Pain, MD;  Location: [facility];  Service: Cardiovascular;  Laterality: N/A;   CAROTID ENDARTERECTOMY     CATARACT EXTRACTION W/PHACO Right 03/09/2021   Procedure: CATARACT EXTRACTION PHACO AND INTRAOCULAR LENS PLACEMENT with Placement of Corticosteroid ([place]);  Surgeon: [name], MD;  Location: [facility];  Service: Ophthalmology;  Laterality: Right;  CDE 9.16   CATARACT EXTRACTION W/PHACO Left 04/02/2021   Procedure: CATARACT EXTRACTION PHACO AND INTRAOCULAR LENS  PLACEMENT LEFT EYE;  Surgeon: [name], MD;  Location: [facility];  Service: Ophthalmology;  Laterality: Left;  left CDE=6.75   ENDARTERECTOMY Left 03/27/2017   Procedure: ENDARTERECTOMY CAROTID LEFT;  Surgeon: [name], MD;  Location: [place];  Service: Vascular;  Laterality: Left;   INNER EAR SURGERY Right    puntured eardrum- repaired 2x's    KNEE ARTHROPLASTY     NASAL SINUS SURGERY     deviated septum   PATCH ANGIOPLASTY Left 03/27/2017   Procedure: PATCH ANGIOPLASTY LEFT CAROTID ARTERY USING Rueben Bash BIOLOGIC PATCH;  Surgeon: [name], MD;  Location: [place];  Service: Vascular;  Laterality: Left;   TONSILLECTOMY     TOTAL KNEE ARTHROPLASTY Right 10/23/2016   Procedure: RIGHT TOTAL KNEE ARTHROPLASTY;  Surgeon: [name], MD;  Location: [place];  Service: Orthopedics;  Laterality: Right;   Patient Active Problem List   Diagnosis Date Noted   Thyroid nodule greater than or equal to 1.5 cm in diameter incidentally noted on imaging study 09/03/2022   Myofascial neck pain 09/03/2022   Lumbar radiculopathy 05/08/2022   Hyperlipidemia 05/08/2022   Cellulitis of right forearm 01/22/2022   Sepsis secondary to UTI ([place]) 12/26/2021   Acute pyelonephritis [id number]   Acute metabolic encephalopathy [id number]   Sepsis ([place]) 12/25/2021   AKI (acute kidney injury) ([place]) 12/25/2021   Hyponatremia 12/25/2021   Dehydration  12/25/2021   Atrial fibrillation, chronic ([place]) 12/25/2021   Essential hypertension 12/25/2021   GERD (gastroesophageal reflux disease) 12/25/2021   Acquired trigger finger of left middle finger 08/04/2020   Arthritis of hand 08/04/2020   Osteoarthritis of metacarpophalangeal (MCP) joint 08/03/2020   Bilateral hand pain 06/22/2020   Osteoarthritis of hands, 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   Osteoarthritis of feet, 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   PAF (paroxysmal atrial fibrillation) ([facility])    Chronic cough 12/29/2017   Atrial fibrillation with rapid  ventricular response ([place]) 12/23/2017   Acute on chronic combined systolic and diastolic CHF (congestive heart failure) ([place])    Menopausal symptom 10/17/2017   Menopausal syndrome 10/17/2017   Carotid stenosis 03/27/2017   S/P total knee arthroplasty, right 10/23/2016   Unilateral primary osteoarthritis, right knee 07/13/2016    PCP: Alvira Monday, FNP   REFERRING PROVIDER:  Lorine Bears, NP   REFERRING DIAG: M54.2 (ICD-10-CM) - Cervicalgia [phone] (ICD-10-CM) - Chronic left shoulder pain M79.18 (ICD-10-CM) - Myofascial pain M47.812 (ICD-10-CM) - Facet arthropathy, cervical  THERAPY DIAG:  Cervicalgia  Abnormal posture  Muscle weakness (generalized)  Rationale for Evaluation and Treatment: Rehabilitation  ONSET DATE: July 2023  SUBJECTIVE:                                                                                                                                                                                                         SUBJECTIVE STATEMENT: Pt stating the treatment at her last visit really helped and she wished she had 2 appointments last week. Pt reporting 4-5/10 pain in her neck upon arrival. Pt stating lifting her arms increases her pain, like when she is dressing her upper body.   PERTINENT HISTORY:  Arthritis, GERD, Carotid artery occlusion, HTN, HA, scoloisis of lumbar spine, cardioversion 2019, back surgery, abdominal hysterectomy  PAIN:  NPRS scale:4-5/10 neck pain today Pain location: left sided cervical spine Pain description: achy Aggravating factors: turning head Relieving factors: brace, tylenol  PRECAUTIONS: None  WEIGHT BEARING RESTRICTIONS: No  FALLS:  Has patient fallen in last 6 months? No  LIVING ENVIRONMENT: Lives with: lives with their family and lives alone Lives in: House/apartment Stairs: Yes: External: 2 steps; on left going up Has following equipment at home: Single point cane  OCCUPATION:  retired  PLOF: Independent  PATIENT GOALS: Be able to turn my head without moving my entire body. Stop hurting.   Next MD visit:  OBJECTIVE:   DIAGNOSTIC FINDINGS:  MRI 08/26/22 IMPRESSION: 1. Multilevel cervical disc and facet degeneration without spinal stenosis. 2. Moderate right neural foraminal stenosis  at C5-6. 3. Mild-to-moderate right neural foraminal stenosis at C3-4. 4. C1-2 arthropathy with left-sided marrow edema. 5. 1.5 cm incidental right thyroid nodule. Recommend non-emergent thyroid ultrasound   Cervical x-rays exhibits severe multi level degenerative changes, anterolisthesis, disc height loss and prominent facet arthropathy noted at the level of C5-C6.   IMPRESSION:  1. Lumbar spondylosis and degenerative disc disease, causing  moderate impingement at L3-4 and mild impingement at L2-3 and L4-5,  as detailed above.  2. Levoconvex lumbar scoliosis with rotary component.    PATIENT SURVEYS:  08/19/22: FOTO intake: 54%    COGNITION: Overall cognitive status: Within functional limits for tasks assessed  SENSATION: WFL  POSTURE:  head tilted to the right with left shoulder hiking at rest, forward head and rounded shoulders  PALPATION: TTP: Left upper trap and left levator   CERVICAL ROM:   ROM AROM (deg) 08/19/22  Flexion 18  Extension 8  Right lateral flexion 16  Left lateral flexion 8  Right rotation 25  Left rotation 12   (Blank rows = not tested)  UPPER EXTREMITY ROM:  Active ROM Right 08/19/22 Left 08/19/22  Shoulder flexion 140 144  Shoulder extension    Shoulder abduction 136 130  Shoulder adduction    Shoulder extension    Shoulder internal rotation    Shoulder external rotation    Elbow flexion    Elbow extension    Wrist flexion    Wrist extension    Wrist ulnar deviation    Wrist radial deviation    Wrist pronation    Wrist supination     (Blank rows = not tested)  UPPER EXTREMITY MMT:  MMT  Right eval Left eval   Shoulder flexion 4 3  Shoulder extension    Shoulder abduction    Shoulder adduction    Shoulder extension    Shoulder internal rotation    Shoulder external rotation    Middle trapezius    Lower trapezius    Elbow flexion    Elbow extension    Wrist flexion    Wrist extension    Wrist ulnar deviation    Wrist radial deviation    Wrist pronation    Wrist supination    Grip strength     (Blank rows = not tested)    TODAY'S TREATMENT: DATE:  09/09/22:  Manual:  Gentle occipital release: x 10 STM to bil upper traps and levator TherEx Rows: green thera band: 2 x 15 Seated upper trap stretch 3x10 sec bil  Scapular retraction 10 x 5 sec hold Seated cervical rotation 10 x 5 sec hold bil Supine cervical retraction c chin tuck 10 x 5 sec hold Modalities:  Moist heat placed on cervical spine during seated stretches for comfort following manual therapy   09/02/22:  Manual:  Gentle occipital release: x 10 STM to bil upper traps and levator TherEx Seated upper trap stretch 3x10 sec bil  Seated levator scapula stretch 3x10 sec bil Scapular retraction 10 x 5 sec hold Supine cervical rotation 10 x 5 sec hold bil Supine cervical retraction c chin tuck 10 x 5 sec hold Modalities:  Moist heat placed on cervical spine during seated stretches for comfort following manual therapy     08/28/22 TherEx Seated upper trap stretch 3x10 sec bil Seated levator scapula stretch 3x10 sec bil Scapular retraction 10 x 5 sec hold Supine cervical rotation 10 x 5 sec hold bil Supine cervical retraction 10 x 5 sec hold  08/19/22: Therex: HEP instruction/performance c cues for techniques, handout provided.  Trial set performed of each for comprehension and symptom assessment.  See below for exercise list Manual:  Skilled palpation of active trigger points during TPDN Trigger Point Dry-Needling  Treatment instructions: Expect mild to moderate muscle soreness. S/S of pneumothorax if dry  needled over a lung field, and to seek immediate medical attention should they occur. Patient verbalized understanding of these instructions and education.  Patient Consent Given: Yes Education handout provided: Yes Muscles treated: bilateral cervical paraspinals, left suboccipitals  Treatment response/outcome: twitches noted   PATIENT EDUCATION:  Education details: HEP, POC Person educated: Patient Education method: Consulting civil engineer, Demonstration, Verbal cues, and Handouts Education comprehension: verbalized understanding, returned demonstration, and verbal cues required  HOME EXERCISE PROGRAM: Access Code: [email or web address] URL: [email or web address] Date: 08/28/2022 Prepared by: Faustino Congress  Exercises - Seated Upper Trapezius Stretch  - 3 x daily - 7 x weekly - 5 reps - 10 seconds hold - Gentle Levator Scapulae Stretch  - 3 x daily - 7 x weekly - 5 reps - 10 seconds hold - Seated Scapular Retraction  - 3 x daily - 7 x weekly - 10 reps - 10 seconds hold - Supine Cervical Rotation AROM on Pillow  - 3 x daily - 7 x weekly - 1 sets - 10 reps - 5 sec hold - Supine Chin Tuck  - 3 x daily - 7 x weekly - 1 sets - 10 reps - 5 sec hold  ASSESSMENT:  CLINICAL IMPRESSION: Pt arriving today reporting 4-5/10 in her neck. Pt did report relief following her last session therefore her treatment was repeated this visit with focus on manual therapy. Continue skilled PT to maximize pt's function.   OBJECTIVE IMPAIRMENTS: decreased ROM, decreased strength, impaired UE functional use, postural dysfunction, and pain.   ACTIVITY LIMITATIONS: lifting, sitting, sleeping, dressing, and reach over head  PARTICIPATION LIMITATIONS: community activity and church  PERSONAL FACTORS: 3+ comorbidities: see above  are also affecting patient's functional outcome.   REHAB POTENTIAL: Good  CLINICAL DECISION MAKING: Stable/uncomplicated  EVALUATION COMPLEXITY: Low   GOALS: Goals reviewed with  patient? Yes  SHORT TERM GOALS: (target date for Short term goals are 3 weeks 09/13/22)  1.Patient will demonstrate independent use of home exercise program to maintain progress from in clinic treatments. Goal status: On-going 09/02/22  LONG TERM GOALS: (target dates for all long term goals are 8 weeks  10/18/22 )   1. Patient will demonstrate/report pain at worst less than or equal to 2/10 to facilitate minimal limitation in daily activity secondary to pain symptoms. Goal status: on-going 09/09/22   2. Patient will demonstrate independent use of home exercise program to facilitate ability to maintain/progress functional gains from skilled physical therapy services. Goal status: New   3. Patient will demonstrate FOTO outcome > or = 59 % to indicate reduced disability due to condition. Goal status: New   4.  Patient will demonstrate cervical AROM WFL s symptoms to facilitate usual head movements for daily activity including driving, self care.   Goal status: New   5.  Pt will improve her cervical rotation to >/=  40 degrees bilaterally to help improve driving and functional mobility.  Goal status: New   6.  Pt will be able to lift 3# from counter to overhead shelf with no pain reported.  Goal status: New      PLAN:  PT FREQUENCY: 1-2x/week  PT DURATION: 8 weeks  PLANNED INTERVENTIONS: Therapeutic exercises,  Therapeutic activity, Neuro Muscular re-education, Balance training, Gait training, Patient/Family education, Joint mobilization, Stair training, DME instructions, Dry Needling, Electrical stimulation, Cryotherapy, vasopneumatic device,Traction, Moist heat, Taping, Ultrasound, Ionotophoresis 4mg /ml Dexamethasone, and Manual therapy.  All included unless contraindicated  PLAN FOR NEXT SESSION: Repeat DN if indicated, Review HEP use/response, postural exercises, manual as needed No estim until work up for thyroid nodule determined benign.     Kearney Hard, PT,  MPT 09/09/22 9:22 AM   09/09/22 9:22 AM

## 2022-09-10 ENCOUNTER — Other Ambulatory Visit: Payer: Self-pay | Admitting: *Deleted

## 2022-09-10 MED ORDER — APIXABAN 5 MG PO TABS: 5.0000 mg | ORAL_TABLET | Freq: Two times a day (BID) | ORAL | 5 refills | Status: DC

## 2022-09-10 NOTE — Telephone Encounter (Signed)
Prescription refill request for Eliquis received.  Indication: afib  Last office visit: [place], 09/05/2022 Scr: 1.23, 09/06/2022 Age: 87 yo  Weight:  68.5 kg   Refill sent.

## 2022-09-11 ENCOUNTER — Encounter: Payer: Medicare Other | Admitting: Physical Therapy

## 2022-09-12 ENCOUNTER — Ambulatory Visit (HOSPITAL_COMMUNITY): Payer: Medicare Other

## 2022-09-12 ENCOUNTER — Other Ambulatory Visit: Payer: Medicare Other

## 2022-09-12 ENCOUNTER — Ambulatory Visit (INDEPENDENT_AMBULATORY_CARE_PROVIDER_SITE_OTHER): Payer: Medicare Other | Admitting: Physical Medicine and Rehabilitation

## 2022-09-12 VITALS — BP 157/76 | HR 68

## 2022-09-12 DIAGNOSIS — M47812 Spondylosis without myelopathy or radiculopathy, cervical region: Secondary | ICD-10-CM

## 2022-09-12 MED ORDER — METHYLPREDNISOLONE ACETATE 80 MG/ML IJ SUSP
40.0000 mg | Freq: Once | INTRAMUSCULAR | Status: AC
  Administered 2022-09-12: 40 mg

## 2022-09-12 NOTE — Progress Notes (Signed)
Functional Pain Scale - descriptive words and definitions  Distressing (6)    Pain is present/unable to complete most ADLs limited by pain/sleep is difficult and active distraction is only marginal. Moderate range order  Average Pain  varies   +Driver, -BT, -Dye Allergies.  Neck pain on left with radiation in the upper arm. Upper arm feels tight

## 2022-09-12 NOTE — Patient Instructions (Signed)

## 2022-09-16 ENCOUNTER — Encounter: Payer: Self-pay | Admitting: Physical Therapy

## 2022-09-16 ENCOUNTER — Ambulatory Visit (INDEPENDENT_AMBULATORY_CARE_PROVIDER_SITE_OTHER): Payer: Medicare Other | Admitting: Physical Therapy

## 2022-09-16 DIAGNOSIS — R293 Abnormal posture: Secondary | ICD-10-CM | POA: Diagnosis not present

## 2022-09-16 DIAGNOSIS — M542 Cervicalgia: Secondary | ICD-10-CM | POA: Diagnosis not present

## 2022-09-16 DIAGNOSIS — M6281 Muscle weakness (generalized): Secondary | ICD-10-CM | POA: Diagnosis not present

## 2022-09-16 NOTE — Therapy (Signed)
OUTPATIENT PHYSICAL THERAPY TREATMENT   Patient Name: Lydia Graham MRN: [id number] DOB:13-Jun-1936, 87 y.o., female Today's Date: 09/16/2022  END OF SESSION:  PT End of Session - 09/16/22 0919     Visit Number 5    Number of Visits 16    Date for PT Re-Evaluation 10/18/22    PT Start Time 0848    PT Stop Time 0928    PT Time Calculation (min) 40 min    Activity Tolerance Patient tolerated treatment well    Behavior During Therapy [facility] for tasks assessed/performed               Past Medical History:  Diagnosis Date   Arthritis    oa, all over - multiple areas    Carotid artery occlusion    Dysrhythmia    AFib    GERD (gastroesophageal reflux disease)    Headache    h/o migraines    Hypertension    PAF (paroxysmal atrial fibrillation) ([place])    a. multiple DCCV's in 2019 and eventually required initiation of Amiodarone with successful DCCV after this. b. recurrent in 12/2021 while admitted for Urosepsis   Past Surgical History:  Procedure Laterality Date   ABDOMINAL HYSTERECTOMY     APPENDECTOMY     BACK SURGERY     CARDIOVERSION N/A 12/19/2017   Procedure: CARDIOVERSION;  Surgeon: Arnoldo Lenis, MD;  [facility];  Service: Endoscopy;  Laterality: N/A;   CARDIOVERSION N/A 12/26/2017   Procedure: CARDIOVERSION;  Surgeon: [name], MD;  Location: [place];  Service: Cardiovascular;  Laterality: N/A;   CARDIOVERSION N/A 01/26/2018   Procedure: CARDIOVERSION;  Surgeon: Jerline Pain, MD;  Location: [facility] ENDOSCOPY;  Service: Cardiovascular;  Laterality: N/A;   CAROTID ENDARTERECTOMY     CATARACT EXTRACTION W/PHACO Right 03/09/2021   Procedure: CATARACT EXTRACTION PHACO AND INTRAOCULAR LENS PLACEMENT with Placement of Corticosteroid ([place]);  Surgeon: [name], MD;  Location: [facility];  Service: Ophthalmology;  Laterality: Right;  CDE 9.16   CATARACT EXTRACTION W/PHACO Left 04/02/2021   Procedure: CATARACT EXTRACTION PHACO AND INTRAOCULAR  LENS PLACEMENT LEFT EYE;  Surgeon: [name], MD;  Location: [facility];  Service: Ophthalmology;  Laterality: Left;  left CDE=6.75   ENDARTERECTOMY Left 03/27/2017   Procedure: ENDARTERECTOMY CAROTID LEFT;  Surgeon: [name], MD;  Location: [place];  Service: Vascular;  Laterality: Left;   INNER EAR SURGERY Right    puntured eardrum- repaired 2x's    KNEE ARTHROPLASTY     NASAL SINUS SURGERY     deviated septum   PATCH ANGIOPLASTY Left 03/27/2017   Procedure: PATCH ANGIOPLASTY LEFT CAROTID ARTERY USING R[employer] Bash BIOLOGIC PATCH;  Surgeon: [name], MD;  Location: [place];  Service: Vascular;  Laterality: Left;   TONSILLECTOMY     TOTAL KNEE ARTHROPLASTY Right 10/23/2016   Procedure: RIGHT TOTAL KNEE ARTHROPLASTY;  Surgeon: [name], MD;  Location: [place];  Service: Orthopedics;  Laterality: Right;   Patient Active Problem List   Diagnosis Date Noted   Thyroid nodule greater than or equal to 1.5 cm in diameter incidentally noted on imaging study 09/03/2022   Myofascial neck pain 09/03/2022   Lumbar radiculopathy 05/08/2022   Hyperlipidemia 05/08/2022   Cellulitis of right forearm 01/22/2022   Sepsis secondary to UTI ([place]) 12/26/2021   Acute pyelonephritis [id number]   Acute metabolic encephalopathy XX123456   Sepsis ([place]) 12/25/2021   AKI (acute kidney injury) ([place]) 12/25/2021   Hyponatremia 12/25/2021  Dehydration 12/25/2021   Atrial fibrillation, chronic ([place]) 12/25/2021   Essential hypertension 12/25/2021   GERD (gastroesophageal reflux disease) 12/25/2021   Acquired trigger finger of left middle finger 08/04/2020   Arthritis of hand 08/04/2020   Osteoarthritis of metacarpophalangeal (MCP) joint 08/03/2020   Bilateral hand pain 06/22/2020   Osteoarthritis of hands, 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   Osteoarthritis of feet, 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   PAF (paroxysmal atrial fibrillation) ([facility])    Chronic cough 12/29/2017   Atrial fibrillation with rapid  ventricular response ([place]) 12/23/2017   Acute on chronic combined systolic and diastolic CHF (congestive heart failure) ([place])    Menopausal symptom 10/17/2017   Menopausal syndrome 10/17/2017   Carotid stenosis 03/27/2017   S/P total knee arthroplasty, right 10/23/2016   Unilateral primary osteoarthritis, right knee 07/13/2016    PCP: Alvira Monday, FNP   REFERRING PROVIDER:  Lorine Bears, NP   REFERRING DIAG: M54.2 (ICD-10-CM) - Cervicalgia [phone] (ICD-10-CM) - Chronic left shoulder pain M79.18 (ICD-10-CM) - Myofascial pain M47.812 (ICD-10-CM) - Facet arthropathy, cervical  THERAPY DIAG:  Cervicalgia  Abnormal posture  Muscle weakness (generalized)  Rationale for Evaluation and Treatment: Rehabilitation  ONSET DATE: July 2023  SUBJECTIVE:                                                                                                                                                                                                         SUBJECTIVE STATEMENT: Pt stating the injection from her MD hasn't seemed to help yet. Pt arriving today with 9/10 pain in neck.   PERTINENT HISTORY:  Arthritis, GERD, Carotid artery occlusion, HTN, HA, scoloisis of lumbar spine, cardioversion 2019, back surgery, abdominal hysterectomy  PAIN:  NPRS scale:9/10 neck pain today Pain location: left sided cervical spine Pain description: achy Aggravating factors: turning head Relieving factors: brace, tylenol  PRECAUTIONS: None  WEIGHT BEARING RESTRICTIONS: No  FALLS:  Has patient fallen in last 6 months? No  LIVING ENVIRONMENT: Lives with: lives with their family and lives alone Lives in: House/apartment Stairs: Yes: External: 2 steps; on left going up Has following equipment at home: Single point cane  OCCUPATION: retired  PLOF: Independent  PATIENT GOALS: Be able to turn my head without moving my entire body. Stop hurting.   Next MD visit:  OBJECTIVE:    DIAGNOSTIC FINDINGS:  MRI 08/26/22 IMPRESSION: 1. Multilevel cervical disc and facet degeneration without spinal stenosis. 2. Moderate right neural foraminal stenosis at C5-6. 3. Mild-to-moderate right neural foraminal stenosis at C3-4. 4. C1-2 arthropathy with left-sided marrow edema. 5. 1.5 cm incidental right thyroid  nodule. Recommend non-emergent thyroid ultrasound   Cervical x-rays exhibits severe multi level degenerative changes, anterolisthesis, disc height loss and prominent facet arthropathy noted at the level of C5-C6.   IMPRESSION:  1. Lumbar spondylosis and degenerative disc disease, causing  moderate impingement at L3-4 and mild impingement at L2-3 and L4-5,  as detailed above.  2. Levoconvex lumbar scoliosis with rotary component.    PATIENT SURVEYS:  08/19/22: FOTO intake: 54%    COGNITION: Overall cognitive status: Within functional limits for tasks assessed  SENSATION: WFL  POSTURE:  head tilted to the right with left shoulder hiking at rest, forward head and rounded shoulders  PALPATION: TTP: Left upper trap and left levator   CERVICAL ROM:   ROM AROM (deg) 08/19/22  Flexion 18  Extension 8  Right lateral flexion 16  Left lateral flexion 8  Right rotation 25  Left rotation 12   (Blank rows = not tested)  UPPER EXTREMITY ROM:  Active ROM Right 08/19/22 Left 08/19/22  Shoulder flexion 140 144  Shoulder extension    Shoulder abduction 136 130  Shoulder adduction    Shoulder extension    Shoulder internal rotation    Shoulder external rotation    Elbow flexion    Elbow extension    Wrist flexion    Wrist extension    Wrist ulnar deviation    Wrist radial deviation    Wrist pronation    Wrist supination     (Blank rows = not tested)  UPPER EXTREMITY MMT:  MMT  Right eval Left eval  Shoulder flexion 4 3  Shoulder extension    Shoulder abduction    Shoulder adduction    Shoulder extension    Shoulder internal rotation     Shoulder external rotation    Middle trapezius    Lower trapezius    Elbow flexion    Elbow extension    Wrist flexion    Wrist extension    Wrist ulnar deviation    Wrist radial deviation    Wrist pronation    Wrist supination    Grip strength     (Blank rows = not tested)    TODAY'S TREATMENT: DATE:  09/16/22:  Manual:  Gentle occipital release: x 10 STM to bil upper traps, levator, rhomboids, and cervical paraspinals bilaterally Cervical PROM in supine TherEx UBE: 2 minutes each direction Seated upper trap stretch x 3 each side with manual trigger point release Scapular retraction 10 x 5 sec hold Supine cervical retraction c chin tuck x 5  Modalities:  Moist heat placed on cervical spine during seated stretches for comfort following manual therapy    09/09/22:  Manual:  Gentle occipital release: x 10 STM to bil upper traps and levator TherEx Rows: green thera band: 2 x 15 Seated upper trap stretch 3x10 sec bil  Scapular retraction 10 x 5 sec hold Seated cervical rotation 10 x 5 sec hold bil Supine cervical retraction c chin tuck 10 x 5 sec hold Modalities:  Moist heat placed on cervical spine during seated stretches for comfort following manual therapy   09/02/22:  Manual:  Gentle occipital release: x 10 STM to bil upper traps and levator TherEx Seated upper trap stretch 3x10 sec bil  Seated levator scapula stretch 3x10 sec bil Scapular retraction 10 x 5 sec hold Supine cervical rotation 10 x 5 sec hold bil Supine cervical retraction c chin tuck 10 x 5 sec hold Modalities:  Moist heat placed on cervical spine during  seated stretches for comfort following manual therapy     PATIENT EDUCATION:  Education details: HEP, POC Person educated: Patient Education method: Explanation, Demonstration, Verbal cues, and Handouts Education comprehension: verbalized understanding, returned demonstration, and verbal cues required  HOME EXERCISE PROGRAM: Access  Code: [email or web address] URL: [email or web address] Date: 08/28/2022 Prepared by: Faustino Congress  Exercises - Seated Upper Trapezius Stretch  - 3 x daily - 7 x weekly - 5 reps - 10 seconds hold - Gentle Levator Scapulae Stretch  - 3 x daily - 7 x weekly - 5 reps - 10 seconds hold - Seated Scapular Retraction  - 3 x daily - 7 x weekly - 10 reps - 10 seconds hold - Supine Cervical Rotation AROM on Pillow  - 3 x daily - 7 x weekly - 1 sets - 10 reps - 5 sec hold - Supine Chin Tuck  - 3 x daily - 7 x weekly - 1 sets - 10 reps - 5 sec hold  ASSESSMENT:  CLINICAL IMPRESSION: Pt arriving today reporting 9/10 in her neck. Pt stating the cervical injection has not helped so far, but she is willing to give it two weeks which was suggested by the MD. Pt stating she is having trouble sleeping and her prescription medication for muscle relaxation was not helping. Pt was advised to follow up with her PCP. Pt reporting relief following manual therapy this visit. Continue skilled PT to maximize pt's function.   OBJECTIVE IMPAIRMENTS: decreased ROM, decreased strength, impaired UE functional use, postural dysfunction, and pain.   ACTIVITY LIMITATIONS: lifting, sitting, sleeping, dressing, and reach over head  PARTICIPATION LIMITATIONS: community activity and church  PERSONAL FACTORS: 3+ comorbidities: see above  are also affecting patient's functional outcome.   REHAB POTENTIAL: Good  CLINICAL DECISION MAKING: Stable/uncomplicated  EVALUATION COMPLEXITY: Low   GOALS: Goals reviewed with patient? Yes  SHORT TERM GOALS: (target date for Short term goals are 3 weeks 09/13/22)  1.Patient will demonstrate independent use of home exercise program to maintain progress from in clinic treatments. Goal status: MET 09/16/22  LONG TERM GOALS: (target dates for all long term goals are 8 weeks  10/18/22 )   1. Patient will demonstrate/report pain at worst less than or equal to 2/10 to facilitate  minimal limitation in daily activity secondary to pain symptoms. Goal status: on-going 09/09/22   2. Patient will demonstrate independent use of home exercise program to facilitate ability to maintain/progress functional gains from skilled physical therapy services. Goal status: New   3. Patient will demonstrate FOTO outcome > or = 59 % to indicate reduced disability due to condition. Goal status: New   4.  Patient will demonstrate cervical AROM WFL s symptoms to facilitate usual head movements for daily activity including driving, self care.   Goal status: New   5.  Pt will improve her cervical rotation to >/=  40 degrees bilaterally to help improve driving and functional mobility.  Goal status: New   6.  Pt will be able to lift 3# from counter to overhead shelf with no pain reported.  Goal status: New      PLAN:  PT FREQUENCY: 1-2x/week  PT DURATION: 8 weeks  PLANNED INTERVENTIONS: Therapeutic exercises, Therapeutic activity, Neuro Muscular re-education, Balance training, Gait training, Patient/Family education, Joint mobilization, Stair training, DME instructions, Dry Needling, Electrical stimulation, Cryotherapy, vasopneumatic device,Traction, Moist heat, Taping, Ultrasound, Ionotophoresis 4mg /ml Dexamethasone, and Manual therapy.  All included unless contraindicated  PLAN FOR NEXT SESSION:  Re-measure next visit Repeat  DN if indicated, Review HEP use/response, postural exercises, manual as needed No estim until work up for thyroid nodule determined benign.     Kearney Hard, PT, MPT 09/16/22 9:34 AM   09/16/22 9:34 AM

## 2022-09-18 ENCOUNTER — Encounter: Payer: Self-pay | Admitting: Physical Therapy

## 2022-09-18 ENCOUNTER — Ambulatory Visit (INDEPENDENT_AMBULATORY_CARE_PROVIDER_SITE_OTHER): Payer: Medicare Other | Admitting: Physical Therapy

## 2022-09-18 DIAGNOSIS — R293 Abnormal posture: Secondary | ICD-10-CM

## 2022-09-18 DIAGNOSIS — M542 Cervicalgia: Secondary | ICD-10-CM

## 2022-09-18 DIAGNOSIS — M6281 Muscle weakness (generalized): Secondary | ICD-10-CM | POA: Diagnosis not present

## 2022-09-18 NOTE — Therapy (Signed)
OUTPATIENT PHYSICAL THERAPY TREATMENT   Patient Name: Lydia Graham MRN: [id number] DOB:November 25, 1935, 87 y.o., female Today's Date: 09/18/2022  END OF SESSION:  PT End of Session - 09/18/22 0843     Visit Number 6    Number of Visits 16    Date for PT Re-Evaluation 10/18/22    PT Start Time 0842    PT Stop Time 0922    PT Time Calculation (min) 40 min    Activity Tolerance Patient tolerated treatment well    Behavior During Therapy [facility] for tasks assessed/performed                Past Medical History:  Diagnosis Date   Arthritis    oa, all over - multiple areas    Carotid artery occlusion    Dysrhythmia    AFib    GERD (gastroesophageal reflux disease)    Headache    h/o migraines    Hypertension    PAF (paroxysmal atrial fibrillation) ([place])    a. multiple DCCV's in 2019 and eventually required initiation of Amiodarone with successful DCCV after this. b. recurrent in 12/2021 while admitted for Urosepsis   Past Surgical History:  Procedure Laterality Date   ABDOMINAL HYSTERECTOMY     APPENDECTOMY     BACK SURGERY     CARDIOVERSION N/A 12/19/2017   Procedure: CARDIOVERSION;  Surgeon: Arnoldo Lenis, MD;  [facility];  Service: Endoscopy;  Laterality: N/A;   CARDIOVERSION N/A 12/26/2017   Procedure: CARDIOVERSION;  Surgeon: [name], MD;  Location: [place];  Service: Cardiovascular;  Laterality: N/A;   CARDIOVERSION N/A 01/26/2018   Procedure: CARDIOVERSION;  Surgeon: Jerline Pain, MD;  Location: [facility];  Service: Cardiovascular;  Laterality: N/A;   CAROTID ENDARTERECTOMY     CATARACT EXTRACTION W/PHACO Right 03/09/2021   Procedure: CATARACT EXTRACTION PHACO AND INTRAOCULAR LENS PLACEMENT with Placement of Corticosteroid ([place]);  Surgeon: [name], MD;  Location: [facility];  Service: Ophthalmology;  Laterality: Right;  CDE 9.16   CATARACT EXTRACTION W/PHACO Left 04/02/2021   Procedure: CATARACT EXTRACTION PHACO AND INTRAOCULAR  LENS PLACEMENT LEFT EYE;  Surgeon: [name], MD;  Location: [facility];  Service: Ophthalmology;  Laterality: Left;  left CDE=6.75   ENDARTERECTOMY Left 03/27/2017   Procedure: ENDARTERECTOMY CAROTID LEFT;  Surgeon: [name], MD;  Location: [place];  Service: Vascular;  Laterality: Left;   INNER EAR SURGERY Right    puntured eardrum- repaired 2x's    KNEE ARTHROPLASTY     NASAL SINUS SURGERY     deviated septum   PATCH ANGIOPLASTY Left 03/27/2017   Procedure: PATCH ANGIOPLASTY LEFT CAROTID ARTERY USING Rueben Bash BIOLOGIC PATCH;  Surgeon: [name], MD;  Location: [place];  Service: Vascular;  Laterality: Left;   TONSILLECTOMY     TOTAL KNEE ARTHROPLASTY Right 10/23/2016   Procedure: RIGHT TOTAL KNEE ARTHROPLASTY;  Surgeon: [name], MD;  Location: [place];  Service: Orthopedics;  Laterality: Right;   Patient Active Problem List   Diagnosis Date Noted   Thyroid nodule greater than or equal to 1.5 cm in diameter incidentally noted on imaging study 09/03/2022   Myofascial neck pain 09/03/2022   Lumbar radiculopathy 05/08/2022   Hyperlipidemia 05/08/2022   Cellulitis of right forearm 01/22/2022   Sepsis secondary to UTI ([place]) 12/26/2021   Acute pyelonephritis [id number]   Acute metabolic encephalopathy XX123456   Sepsis ([place]) 12/25/2021   AKI (acute kidney injury) ([place]) 12/25/2021   Hyponatremia 12/25/2021  Dehydration 12/25/2021   Atrial fibrillation, chronic ([place]) 12/25/2021   Essential hypertension 12/25/2021   GERD (gastroesophageal reflux disease) 12/25/2021   Acquired trigger finger of left middle finger 08/04/2020   Arthritis of hand 08/04/2020   Osteoarthritis of metacarpophalangeal (MCP) joint 08/03/2020   Bilateral hand pain 06/22/2020   Osteoarthritis of hands, 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   Osteoarthritis of feet, 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   PAF (paroxysmal atrial fibrillation) ([facility])    Chronic cough 12/29/2017   Atrial fibrillation with rapid  ventricular response ([place]) 12/23/2017   Acute on chronic combined systolic and diastolic CHF (congestive heart failure) ([place])    Menopausal symptom 10/17/2017   Menopausal syndrome 10/17/2017   Carotid stenosis 03/27/2017   S/P total knee arthroplasty, right 10/23/2016   Unilateral primary osteoarthritis, right knee 07/13/2016    PCP: Alvira Monday, FNP   REFERRING PROVIDER:  Lorine Bears, NP   REFERRING DIAG: M54.2 (ICD-10-CM) - Cervicalgia 5804972276 (ICD-10-CM) - Chronic left shoulder pain M79.18 (ICD-10-CM) - Myofascial pain M47.812 (ICD-10-CM) - Facet arthropathy, cervical  THERAPY DIAG:  Cervicalgia  Abnormal posture  Muscle weakness (generalized)  Rationale for Evaluation and Treatment: Rehabilitation  ONSET DATE: July 2023  SUBJECTIVE:                                                                                                                                                                                                         SUBJECTIVE STATEMENT: I want to repeat what ever we did on Monday. Pt stating she was much better following her treatment last visit.   PERTINENT HISTORY:  Arthritis, GERD, Carotid artery occlusion, HTN, HA, scoloisis of lumbar spine, cardioversion 2019, back surgery, abdominal hysterectomy  PAIN:  NPRS scale:4/10 neck pain today Pain location: left sided cervical spine Pain description: achy Aggravating factors: turning head Relieving factors: brace, tylenol  PRECAUTIONS: None  WEIGHT BEARING RESTRICTIONS: No  FALLS:  Has patient fallen in last 6 months? No  LIVING ENVIRONMENT: Lives with: lives with their family and lives alone Lives in: House/apartment Stairs: Yes: External: 2 steps; on left going up Has following equipment at home: Single point cane  OCCUPATION: retired  PLOF: Independent  PATIENT GOALS: Be able to turn my head without moving my entire body. Stop hurting.   Next MD visit:  OBJECTIVE:    DIAGNOSTIC FINDINGS:  MRI 08/26/22 IMPRESSION: 1. Multilevel cervical disc and facet degeneration without spinal stenosis. 2. Moderate right neural foraminal stenosis at C5-6. 3. Mild-to-moderate right neural foraminal stenosis at C3-4. 4. C1-2 arthropathy with left-sided marrow edema. 5. 1.5 cm incidental right  thyroid nodule. Recommend non-emergent thyroid ultrasound   Cervical x-rays exhibits severe multi level degenerative changes, anterolisthesis, disc height loss and prominent facet arthropathy noted at the level of C5-C6.   IMPRESSION:  1. Lumbar spondylosis and degenerative disc disease, causing  moderate impingement at L3-4 and mild impingement at L2-3 and L4-5,  as detailed above.  2. Levoconvex lumbar scoliosis with rotary component.    PATIENT SURVEYS:  08/19/22: FOTO intake: 54%    COGNITION: Overall cognitive status: Within functional limits for tasks assessed  SENSATION: WFL  POSTURE:  head tilted to the right with left shoulder hiking at rest, forward head and rounded shoulders  PALPATION: TTP: Left upper trap and left levator   CERVICAL ROM:   ROM AROM (deg) 08/19/22 AROM 09/18/22  Flexion 18 20  Extension 8 12  Right lateral flexion 16 20  Left lateral flexion 8 12  Right rotation 25 30  Left rotation 12 15   (Blank rows = not tested)  UPPER EXTREMITY ROM:  Active ROM Right 08/19/22 Left 08/19/22  Shoulder flexion 140 144  Shoulder extension    Shoulder abduction 136 130  Shoulder adduction    Shoulder extension    Shoulder internal rotation    Shoulder external rotation    Elbow flexion    Elbow extension    Wrist flexion    Wrist extension    Wrist ulnar deviation    Wrist radial deviation    Wrist pronation    Wrist supination     (Blank rows = not tested)  UPPER EXTREMITY MMT:  MMT  Right eval Left eval  Shoulder flexion 4 3  Shoulder extension    Shoulder abduction    Shoulder adduction    Shoulder extension     Shoulder internal rotation    Shoulder external rotation     (Blank rows = not tested)    TODAY'S TREATMENT: DATE:  09/18/22 TherEx UBE: 3  minutes each direction Level 1 Rows: green TB x 15 holding 3 sec Seated AAROM shoulder flexion c 1 # bar Seated upper trap stretch x 3 each side with manual trigger point release Supine cervical retraction c chin tuck x 5  Manual:  Gentle occipital release: x 10 STM to bil upper traps, levator, rhomboids, and cervical paraspinals bilaterally Cervical PROM in supine    09/16/22:  Manual:  Gentle occipital release: x 10 STM to bil upper traps, levator, rhomboids, and cervical paraspinals bilaterally Cervical PROM in supine TherEx UBE: 2 minutes each direction Seated upper trap stretch x 3 each side with manual trigger point release Scapular retraction 10 x 5 sec hold Supine cervical retraction c chin tuck x 5  Modalities:  Moist heat placed on cervical spine during seated stretches for comfort following manual therapy    09/09/22:  Manual:  Gentle occipital release: x 10 STM to bil upper traps and levator TherEx Rows: green thera band: 2 x 15 Seated upper trap stretch 3x10 sec bil  Scapular retraction 10 x 5 sec hold Seated cervical rotation 10 x 5 sec hold bil Supine cervical retraction c chin tuck 10 x 5 sec hold Modalities:  Moist heat placed on cervical spine during seated stretches for comfort following manual therapy       PATIENT EDUCATION:  Education details: HEP, POC Person educated: Patient Education method: Consulting civil engineer, Demonstration, Verbal cues, and Handouts Education comprehension: verbalized understanding, returned demonstration, and verbal cues required  HOME EXERCISE PROGRAM: Access Code: [email or web address] URL: [email or web address] Date: 08/28/2022  Prepared by: Faustino Congress  Exercises - Seated Upper Trapezius Stretch  - 3 x daily - 7 x weekly - 5 reps - 10 seconds hold - Gentle Levator  Scapulae Stretch  - 3 x daily - 7 x weekly - 5 reps - 10 seconds hold - Seated Scapular Retraction  - 3 x daily - 7 x weekly - 10 reps - 10 seconds hold - Supine Cervical Rotation AROM on Pillow  - 3 x daily - 7 x weekly - 1 sets - 10 reps - 5 sec hold - Supine Chin Tuck  - 3 x daily - 7 x weekly - 1 sets - 10 reps - 5 sec hold  ASSESSMENT:  CLINICAL IMPRESSION: Pt with good response to last session. Pt wishing to repeat treatment. Minor change in some exercises performed this visit. Continue to progress cervical ROM/stretching, Manual and general postural exercises as pt tolerates. Continue skilled PT interventions to maximize pt's function.   OBJECTIVE IMPAIRMENTS: decreased ROM, decreased strength, impaired UE functional use, postural dysfunction, and pain.   ACTIVITY LIMITATIONS: lifting, sitting, sleeping, dressing, and reach over head  PARTICIPATION LIMITATIONS: community activity and church  PERSONAL FACTORS: 3+ comorbidities: see above  are also affecting patient's functional outcome.   REHAB POTENTIAL: Good  CLINICAL DECISION MAKING: Stable/uncomplicated  EVALUATION COMPLEXITY: Low   GOALS: Goals reviewed with patient? Yes  SHORT TERM GOALS: (target date for Short term goals are 3 weeks 09/13/22)  1.Patient will demonstrate independent use of home exercise program to maintain progress from in clinic treatments. Goal status: MET 09/16/22  LONG TERM GOALS: (target dates for all long term goals are 8 weeks  10/18/22 )   1. Patient will demonstrate/report pain at worst less than or equal to 2/10 to facilitate minimal limitation in daily activity secondary to pain symptoms. Goal status: on-going 09/09/22   2. Patient will demonstrate independent use of home exercise program to facilitate ability to maintain/progress functional gains from skilled physical therapy services. Goal status: New   3. Patient will demonstrate FOTO outcome > or = 59 % to indicate reduced disability  due to condition. Goal status: New   4.  Patient will demonstrate cervical AROM WFL s symptoms to facilitate usual head movements for daily activity including driving, self care.   Goal status: New   5.  Pt will improve her cervical rotation to >/=  40 degrees bilaterally to help improve driving and functional mobility.  Goal status: New   6.  Pt will be able to lift 3# from counter to overhead shelf with no pain reported.  Goal status: New      PLAN:  PT FREQUENCY: 1-2x/week  PT DURATION: 8 weeks  PLANNED INTERVENTIONS: Therapeutic exercises, Therapeutic activity, Neuro Muscular re-education, Balance training, Gait training, Patient/Family education, Joint mobilization, Stair training, DME instructions, Dry Needling, Electrical stimulation, Cryotherapy, vasopneumatic device,Traction, Moist heat, Taping, Ultrasound, Ionotophoresis 4mg /ml Dexamethasone, and Manual therapy.  All included unless contraindicated  PLAN FOR NEXT SESSION:  Repeat DN if indicated, Review HEP use/response, postural exercises, manual as needed No estim until work up for thyroid nodule determined benign.     Kearney Hard, PT, MPT 09/18/22 9:24 AM   09/18/22 9:24 AM

## 2022-09-18 NOTE — Progress Notes (Signed)
Lydia Graham - 87 y.o. female MRN [id number]  Date of birth: [birth year]  Office Visit Note: Visit Date: 09/12/2022 PCP: Alvira Monday, FNP Referred by: Alvira Monday, FNP  Subjective: Chief Complaint  Patient presents with   Neck - Pain   HPI:  Lydia Graham is a 87 y.o. female who comes in today at the request of Barnet Pall, FNP for planned Left  C2-3 Cervical facet/medial branch block with fluoroscopic guidance.  The patient has failed conservative care including home exercise, medications, time and activity modification.  This injection will be diagnostic and hopefully therapeutic.  Please see requesting physician notes for further details and justification.  Exam has shown concordant pain with facet joint loading.   ROS Otherwise per HPI.  Assessment & Plan: Visit Diagnoses:    ICD-10-CM   1. Cervical spondylosis without myelopathy  M47.812 XR C-ARM NO REPORT    Facet Injection    methylPREDNISolone acetate (DEPO-MEDROL) injection 40 mg      Plan: No additional findings.   Meds & Orders:  Meds ordered this encounter  Medications   methylPREDNISolone acetate (DEPO-MEDROL) injection 40 mg    Orders Placed This Encounter  Procedures   Facet Injection   XR C-ARM NO REPORT    Follow-up: Return for visit to requesting provider as needed.   Procedures: No procedures performed  Cervical Facet Joint Intra-Articular Injection with Fluoroscopic Guidance  Patient: Lydia Graham      Date of Birth: [birth year] MRN: [id number] PCP: Alvira Monday, FNP      Visit Date: 09/12/2022   Universal Protocol:    Date/Time: 09/17/2409:39 AM  Consent Given By: the patient  Position: PRONE  Additional Comments: Vital signs were monitored before and after the procedure. Patient was prepped and draped in the usual sterile fashion. The correct patient, procedure, and site was verified.   Injection Procedure Details:  Procedure Site One Meds Administered:   Meds ordered this encounter  Medications   methylPREDNISolone acetate (DEPO-MEDROL) injection 40 mg     Laterality: Left  Location/Site:  C2-3  Needle size: 25 G  Needle type: Spinal  Needle Placement: Articular  Findings:  -Contrast Used: 0.5 mL iohexol 180 mg iodine/mL   -Comments: Excellent flow of contrast producing a partial arthrogram.  Procedure Details: The region overlying the facet joints mentioned above were localized under fluoroscopic visualization. The needle was inserted down to the level of the lateral mass of the superior articular process of the facet joint to be injected. Then, the needle was "walked off" inferiorly into the lateral aspect of the facet joint. Bi-planar images were used for confirming placement and spot radiographs were documented.  A 0.25 ml volume of Omnipaque-240 was injected into the facet joint and a standard partial arthrogram was obtained. Radiographs were obtained of the arthrogram. A 0.5 ml. volume of the steroid/anesthetic solution was injected into the joint. This procedure was repeated for each facet joint injected.   Additional Comments:  No complications occurred Dressing: Band-Aid    Post-procedure details: Patient was observed during the procedure. Post-procedure instructions were reviewed.  Patient left the clinic in stable condition.   Clinical History: Narrative & Impression CLINICAL DATA:  Chronic neck pain.   EXAM: MRI CERVICAL SPINE WITHOUT CONTRAST   TECHNIQUE: Multiplanar, multisequence MR imaging of the cervical spine was performed. No intravenous contrast was administered.   COMPARISON:  Cervical spine radiographs 04/30/2022   FINDINGS: Alignment: Grade 1 anterolisthesis from C4-5 through T1-2, greatest C6-7 where  it measures 4-5 mm. Mild-to-moderate left convex curvature of the lower cervical spine.   Vertebrae: No fracture or suspicious marrow lesion. Moderate marrow edema associated with left-sided  C1-2 arthropathy. Small right-sided C1-2 joint effusion. Mild degenerative endplate edema at [id number].   Cord: Normal signal.   Posterior Fossa, vertebral arteries, paraspinal tissues: 1.5 cm T2 hyperintense nodule in the posterior right thyroid lobe. Minor T2 hyperintensities in the pons, nonspecific but most often seen with chronic small vessel ischemia. Preserved vertebral artery flow voids.   Disc levels:   C2-3: Shallow right central disc protrusion without stenosis.   C3-4: Uncovertebral spurring and mild facet arthrosis result in mild-to-moderate right neural foraminal stenosis without spinal stenosis.   C4-5: Mild right and moderate to severe left facet arthrosis without stenosis.   C5-6: Severe disc space narrowing. Anterolisthesis with bulging uncovered disc, asymmetric right uncovertebral spurring, and moderate right and mild left facet arthrosis result in moderate right neural foraminal stenosis without spinal stenosis.   C6-7: Severe disc space narrowing. Anterolisthesis with disc uncovering, uncovertebral spurring, and moderate right and mild left facet arthrosis. No significant stenosis.   C7-T1: Anterolisthesis with disc uncovering and moderate right facet arthrosis. No stenosis.   T1-2: Anterolisthesis with disc uncovering and moderate right facet arthrosis. No stenosis.   IMPRESSION: 1. Multilevel cervical disc and facet degeneration without spinal stenosis. 2. Moderate right neural foraminal stenosis at C5-6. 3. Mild-to-moderate right neural foraminal stenosis at C3-4. 4. C1-2 arthropathy with left-sided marrow edema. 5. 1.5 cm incidental right thyroid nodule. Recommend non-emergent thyroid ultrasound. Reference: J Am Coll Radiol. 2015 Feb;12(2): 143-50     Electronically Signed   By: Logan Bores M.D.   On: 08/27/2022 12:08     Objective:  VS:  HT:    WT:   BMI:     BP:(!) 157/76  HR:68bpm  TEMP: ( )  RESP:  Physical Exam Vitals and  nursing note reviewed.  Constitutional:      General: She is not in acute distress.    Appearance: Normal appearance. She is not ill-appearing.  HENT:     Head: Normocephalic and atraumatic.     Right Ear: External ear normal.     Left Ear: External ear normal.  Eyes:     Extraocular Movements: Extraocular movements intact.  Cardiovascular:     Rate and Rhythm: Normal rate.     Pulses: Normal pulses.  Musculoskeletal:     Cervical back: Tenderness present. No rigidity.     Right lower leg: No edema.     Left lower leg: No edema.     Comments: Patient has good strength in the upper extremities including 5 out of 5 strength in wrist extension long finger flexion and APB.  There is no atrophy of the hands intrinsically.  There is a negative Hoffmann's test.   Lymphadenopathy:     Cervical: No cervical adenopathy.  Skin:    Findings: No erythema, lesion or rash.  Neurological:     General: No focal deficit present.     Mental Status: She is alert and oriented to person, place, and time.     Sensory: No sensory deficit.     Motor: No weakness or abnormal muscle tone.     Coordination: Coordination normal.  Psychiatric:        Mood and Affect: Mood normal.        Behavior: Behavior normal.      Imaging: No results found.

## 2022-09-18 NOTE — Procedures (Signed)
Cervical Facet Joint Intra-Articular Injection with Fluoroscopic Guidance  Patient: Lydia Graham      Date of Birth: 12-Feb-1936 MRN: [id number] PCP: Alvira Monday, FNP      Visit Date: 09/12/2022   Universal Protocol:    Date/Time: 09/17/2409:39 AM  Consent Given By: the patient  Position: PRONE  Additional Comments: Vital signs were monitored before and after the procedure. Patient was prepped and draped in the usual sterile fashion. The correct patient, procedure, and site was verified.   Injection Procedure Details:  Procedure Site One Meds Administered:  Meds ordered this encounter  Medications   methylPREDNISolone acetate (DEPO-MEDROL) injection 40 mg     Laterality: Left  Location/Site:  C2-3  Needle size: 25 G  Needle type: Spinal  Needle Placement: Articular  Findings:  -Contrast Used: 0.5 mL iohexol 180 mg iodine/mL   -Comments: Excellent flow of contrast producing a partial arthrogram.  Procedure Details: The region overlying the facet joints mentioned above were localized under fluoroscopic visualization. The needle was inserted down to the level of the lateral mass of the superior articular process of the facet joint to be injected. Then, the needle was "walked off" inferiorly into the lateral aspect of the facet joint. Bi-planar images were used for confirming placement and spot radiographs were documented.  A 0.25 ml volume of Omnipaque-240 was injected into the facet joint and a standard partial arthrogram was obtained. Radiographs were obtained of the arthrogram. A 0.5 ml. volume of the steroid/anesthetic solution was injected into the joint. This procedure was repeated for each facet joint injected.   Additional Comments:  No complications occurred Dressing: Band-Aid    Post-procedure details: Patient was observed during the procedure. Post-procedure instructions were reviewed.  Patient left the clinic in stable condition.

## 2022-09-19 ENCOUNTER — Ambulatory Visit (HOSPITAL_COMMUNITY)
Admission: RE | Admit: 2022-09-19 | Discharge: 2022-09-19 | Disposition: A | Discharge status: Home / Self Care | Payer: Medicare Other | Source: Ambulatory Visit | Attending: Family Medicine | Admitting: Family Medicine

## 2022-09-19 DIAGNOSIS — E041 Nontoxic single thyroid nodule: Secondary | ICD-10-CM | POA: Diagnosis not present

## 2022-09-22 NOTE — Progress Notes (Signed)
Please inform the patient that the ultrasound of her thyroid gland showed benign findings of her thyroid nodules.  No further testing is recommended

## 2022-09-30 ENCOUNTER — Encounter: Payer: Self-pay | Admitting: Physical Therapy

## 2022-09-30 ENCOUNTER — Ambulatory Visit (INDEPENDENT_AMBULATORY_CARE_PROVIDER_SITE_OTHER): Payer: Medicare Other | Admitting: Physical Therapy

## 2022-09-30 DIAGNOSIS — R293 Abnormal posture: Secondary | ICD-10-CM

## 2022-09-30 DIAGNOSIS — M542 Cervicalgia: Secondary | ICD-10-CM

## 2022-09-30 DIAGNOSIS — M6281 Muscle weakness (generalized): Secondary | ICD-10-CM

## 2022-09-30 NOTE — Therapy (Signed)
OUTPATIENT PHYSICAL THERAPY TREATMENT Discharge   Patient Name: Lydia Graham MRN: [id number] DOB:[birth year], 87 y.o., female Today's Date: 09/30/2022  END OF SESSION:  PT End of Session - 09/30/22 1004     Visit Number 7    Number of Visits 16    Date for PT Re-Evaluation 10/18/22    PT Start Time 1005    PT Stop Time 1045    PT Time Calculation (min) 40 min    Activity Tolerance Patient tolerated treatment well    Behavior During Therapy WFL for tasks assessed/performed                Past Medical History:  Diagnosis Date   Arthritis    oa, all over - multiple areas    Carotid artery occlusion    Dysrhythmia    AFib    GERD (gastroesophageal reflux disease)    Headache    h/o migraines    Hypertension    PAF (paroxysmal atrial fibrillation)    a. multiple DCCV's in 2019 and eventually required initiation of Amiodarone with successful DCCV after this. b. recurrent in 12/2021 while admitted for Urosepsis   Past Surgical History:  Procedure Laterality Date   ABDOMINAL HYSTERECTOMY     APPENDECTOMY     BACK SURGERY     CARDIOVERSION N/A 12/19/2017   Procedure: CARDIOVERSION;  Surgeon: Antoine Poche, MD;  Location: [facility] SU[facility];  Service: Endoscopy;  Laterality: N/A;   CARDIOVERSION N/A 12/26/2017   Procedure: CARDIOVERSION;  Surgeon: Chrystie Nose, MD;  Location: [facility] ENDOSCOPY;  Service: Cardiovascular;  Laterality: N/A;   CARDIOVERSION N/A 01/26/2018   Procedure: CARDIOVERSION;  Surgeon: Jake Bathe, MD;  Location: [facility] ENDOSCOPY;  Service: Cardiovascular;  Laterality: N/A;   CAROTID ENDARTERECTOMY     CATARACT EXTRACTION W/PHACO Right 03/09/2021   Procedure: CATARACT EXTRACTION PHACO AND INTRAOCULAR LENS PLACEMENT with Placement of Corticosteroid (IOC);  Surgeon: Fabio Pierce, MD;  Location: [facility] ORS;  Service: Ophthalmology;  Laterality: Right;  CDE 9.16   CATARACT EXTRACTION W/PHACO Left 04/02/2021   Procedure: CATARACT EXTRACTION PHACO AND  INTRAOCULAR LENS PLACEMENT LEFT EYE;  Surgeon: Fabio Pierce, MD;  Location: [facility] ORS;  Service: Ophthalmology;  Laterality: Left;  left CDE=6.75   ENDARTERECTOMY Left 03/27/2017   Procedure: ENDARTERECTOMY CAROTID LEFT;  Surgeon: Maeola Harman, MD;  Location: [facility] OR;  Service: Vascular;  Laterality: Left;   INNER EAR SURGERY Right    puntured eardrum- repaired 2x's    KNEE ARTHROPLASTY     NASAL SINUS SURGERY     deviated septum   PATCH ANGIOPLASTY Left 03/27/2017   Procedure: PATCH ANGIOPLASTY LEFT CAROTID ARTERY USING Livia Snellen BIOLOGIC PATCH;  Surgeon: Maeola Harman, MD;  Location: [facility] OR;  Service: Vascular;  Laterality: Left;   TONSILLECTOMY     TOTAL KNEE ARTHROPLASTY Right 10/23/2016   Procedure: RIGHT TOTAL KNEE ARTHROPLASTY;  Surgeon: Nadara Mustard, MD;  Location: [facility] OR;  Service: Orthopedics;  Laterality: Right;   Patient Active Problem List   Diagnosis Date Noted   Thyroid nodule greater than or equal to 1.5 cm in diameter incidentally noted on imaging study 09/03/2022   Myofascial neck pain 09/03/2022   Lumbar radiculopathy 05/08/2022   Hyperlipidemia 05/08/2022   Cellulitis of right forearm 01/22/2022   Sepsis secondary to UTI 12/26/2021   Acute pyelonephritis 12/25/2021   Acute metabolic encephalopathy 12/25/2021   Sepsis 12/25/2021   AKI (acute kidney injury) 12/25/2021   Hyponatremia 12/25/2021   Dehydration 12/25/2021  Atrial fibrillation, chronic 12/25/2021   Essential hypertension 12/25/2021   GERD (gastroesophageal reflux disease) 12/25/2021   Acquired trigger finger of left middle finger 08/04/2020   Arthritis of hand 08/04/2020   Osteoarthritis of metacarpophalangeal (MCP) joint 08/03/2020   Bilateral hand pain 06/22/2020   Osteoarthritis of hands, 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   Osteoarthritis of feet, 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   PAF (paroxysmal atrial fibrillation)    Chronic cough 12/29/2017   Atrial fibrillation with rapid ventricular response  12/23/2017   Acute on chronic combined systolic and diastolic CHF (congestive heart failure)    Menopausal symptom 10/17/2017   Menopausal syndrome 10/17/2017   Carotid stenosis 03/27/2017   S/P total knee arthroplasty, right 10/23/2016   Unilateral primary osteoarthritis, right knee 07/13/2016    PCP: [name], FNP   REFERRING PROVIDER:  Juanda Chance, NP   REFERRING DIAG: M54.2 (ICD-10-CM) - Cervicalgia [phone] (ICD-10-CM) - Chronic left shoulder pain M79.18 (ICD-10-CM) - Myofascial pain M47.812 (ICD-10-CM) - Facet arthropathy, cervical  THERAPY DIAG:  Cervicalgia  Abnormal posture  Muscle weakness (generalized)  Rationale for Evaluation and Treatment: Rehabilitation  ONSET DATE: July 2023  SUBJECTIVE:                                                                                                                                                                                                         SUBJECTIVE STATEMENT: Pt arriving today stating she feels like she has reached a plateau with therapy.   PERTINENT HISTORY:  Arthritis, GERD, Carotid artery occlusion, HTN, HA, scoloisis of lumbar spine, cardioversion 2019, back surgery, abdominal hysterectomy  PAIN:  NPRS scale: pt stating pain varies with activities 2-3/10 today Pain location: left sided cervical spine Pain description: achy Aggravating factors: turning head Relieving factors: brace, tylenol  PRECAUTIONS: None  WEIGHT BEARING RESTRICTIONS: No  FALLS:  Has patient fallen in last 6 months? No  LIVING ENVIRONMENT: Lives with: lives with their family and lives alone Lives in: House/apartment Stairs: Yes: External: 2 steps; on left going up Has following equipment at home: Single point cane  OCCUPATION: retired  PLOF: Independent  PATIENT GOALS: Be able to turn my head without moving my entire body. Stop hurting.   Next MD visit:  OBJECTIVE:   DIAGNOSTIC FINDINGS:  MRI  08/26/22 IMPRESSION: 1. Multilevel cervical disc and facet degeneration without spinal stenosis. 2. Moderate right neural foraminal stenosis at C5-6. 3. Mild-to-moderate right neural foraminal stenosis at C3-4. 4. C1-2 arthropathy with left-sided marrow edema. 5. 1.5 cm incidental right thyroid nodule. Recommend non-emergent thyroid ultrasound   Cervical x-rays  exhibits severe multi level degenerative changes, anterolisthesis, disc height loss and prominent facet arthropathy noted at the level of C5-C6.   IMPRESSION:  1. Lumbar spondylosis and degenerative disc disease, causing  moderate impingement at L3-4 and mild impingement at L2-3 and L4-5,  as detailed above.  2. Levoconvex lumbar scoliosis with rotary component.    PATIENT SURVEYS:  08/19/22: FOTO intake: 54%   09/30/22: FOTO Discharge 57%   COGNITION: Overall cognitive status: Within functional limits for tasks assessed  SENSATION: WFL  POSTURE:  head tilted to the right with left shoulder hiking at rest, forward head and rounded shoulders  PALPATION: TTP: Left upper trap and left levator   CERVICAL ROM:   ROM AROM (deg) 08/19/22 AROM 09/18/22 AROM 09/30/22  Flexion 18 20 22   Extension 8 12 12   Right lateral flexion 16 20 24   Left lateral flexion 8 12 14   Right rotation 25 30 30   Left rotation 12 15 18    (Blank rows = not tested)  UPPER EXTREMITY ROM:  Active ROM Right 08/19/22 Left 08/19/22 Rt/Left 09/30/22  Shoulder flexion 140 144 152/155  Shoulder extension   45/45  Shoulder abduction 136 130 158/148  Shoulder adduction     Shoulder extension     Shoulder internal rotation     Shoulder external rotation     Elbow flexion     Elbow extension     Wrist flexion     Wrist extension     Wrist ulnar deviation     Wrist radial deviation     Wrist pronation     Wrist supination      (Blank rows = not tested)  UPPER EXTREMITY MMT:  MMT  Right eval Left eval Rt/Left 09/30/22  Shoulder flexion 4 3 4  /  4   Shoulder extension     Shoulder abduction     Shoulder adduction     Shoulder extension     Shoulder internal rotation     Shoulder external rotation      (Blank rows = not tested)    TODAY'S TREATMENT: DATE:  Pt's HEP was reviewed and domesticated and all ROM measurements were repeated. (See HEP listed below)     09/18/22 TherEx UBE: 3  minutes each direction Level 1 Rows: green TB x 15 holding 3 sec Seated AAROM shoulder flexion c 1 # bar Seated upper trap stretch x 3 each side with manual trigger point release Supine cervical retraction c chin tuck x 5  Manual:  Gentle occipital release: x 10 STM to bil upper traps, levator, rhomboids, and cervical paraspinals bilaterally Cervical PROM in supine    09/16/22:  Manual:  Gentle occipital release: x 10 STM to bil upper traps, levator, rhomboids, and cervical paraspinals bilaterally Cervical PROM in supine TherEx UBE: 2 minutes each direction Seated upper trap stretch x 3 each side with manual trigger point release Scapular retraction 10 x 5 sec hold Supine cervical retraction c chin tuck x 5  Modalities:  Moist heat placed on cervical spine during seated stretches for comfort following manual therapy    09/09/22:  Manual:  Gentle occipital release: x 10 STM to bil upper traps and levator TherEx Rows: green thera band: 2 x 15 Seated upper trap stretch 3x10 sec bil  Scapular retraction 10 x 5 sec hold Seated cervical rotation 10 x 5 sec hold bil Supine cervical retraction c chin tuck 10 x 5 sec hold Modalities:  Moist heat placed on cervical spine during  seated stretches for comfort following manual therapy       PATIENT EDUCATION:  Education details: HEP, POC Person educated: Patient Education method: Explanation, Demonstration, Verbal cues, and Handouts Education comprehension: verbalized understanding, returned demonstration, and verbal cues required  HOME EXERCISE PROGRAM: Access Code:  [email or web address] URL: [email or web address] Date: 09/30/2022 Prepared by: Narda AmberJennifer Trena Dunavan  Exercises - Seated Upper Trapezius Stretch  - 2 x daily - 7 x weekly - 5 reps - 10 seconds hold - Gentle Levator Scapulae Stretch  - 2 x daily - 7 x weekly - 5 reps - 10 seconds hold - Seated Scapular Retraction  - 2 x daily - 7 x weekly - 10 reps - 10 seconds hold - Supine Cervical Rotation AROM on Pillow  - 2 x daily - 7 x weekly - 1 sets - 10 reps - 5 sec hold - Supine Chin Tuck  - 2 x daily - 7 x weekly - 1 sets - 10 reps - 5 sec hold - Standing Row with Anchored Resistance  - 1-2 x daily - 7 x weekly - 2 sets - 10 reps - 3 seconds hold - Doorway Pec Stretch at 90 Degrees Abduction  - 1-2 x daily - 7 x weekly - 3 reps - 20 seconds hold  ASSESSMENT:  CLINICAL IMPRESSION: Pt has met 4 of her initial 7 goals set. Pt has improved her active cervical and bilateral shoulder ROM since beginning therapy. Pt feels like she has reached her maximum functional status. Pt's HEP was updated and pt was discharged from skilled PT services.   OBJECTIVE IMPAIRMENTS: decreased ROM, decreased strength, impaired UE functional use, postural dysfunction, and pain.   ACTIVITY LIMITATIONS: lifting, sitting, sleeping, dressing, and reach over head  PARTICIPATION LIMITATIONS: community activity and church  PERSONAL FACTORS: 3+ comorbidities: see above  are also affecting patient's functional outcome.   REHAB POTENTIAL: Good  CLINICAL DECISION MAKING: Stable/uncomplicated  EVALUATION COMPLEXITY: Low   GOALS: Goals reviewed with patient? Yes  SHORT TERM GOALS: (target date for Short term goals are 3 weeks 09/13/22)  1.Patient will demonstrate independent use of home exercise program to maintain progress from in clinic treatments. Goal status: MET 09/16/22  LONG TERM GOALS: (target dates for all long term goals are 8 weeks  10/18/22 )   1. Patient will demonstrate/report pain at worst less than or equal to  2/10 to facilitate minimal limitation in daily activity secondary to pain symptoms. Goal status: Not MET 09/30/22: pt still reporting days where her pain can reach moderate to severe depending on activites   2. Patient will demonstrate independent use of home exercise program to facilitate ability to maintain/progress functional gains from skilled physical therapy services. Goal status: MET 09/30/22   3. Patient will demonstrate FOTO outcome > or = 59 % to indicate reduced disability due to condition. Goal status: NOT MET 09/30/22: 57%   4.  Patient will demonstrate cervical AROM WFL s symptoms to facilitate usual head movements for daily activity including driving, self care.   Goal status: NOT MET 09/30/22:   5.  Pt will improve her cervical rotation to >/=  40 degrees bilaterally to help improve driving and functional mobility.  Goal status: NOT MET 09/30/22:   6.  Pt will be able to lift 3# from counter to overhead shelf with no pain reported.  Goal status: MET 09/30/22    PLAN:  PT FREQUENCY: 1-2x/week  PT DURATION: 8 weeks  PLANNED INTERVENTIONS: Therapeutic exercises, Therapeutic activity, Neuro Muscular  re-education, Balance training, Gait training, Patient/Family education, Joint mobilization, Stair training, DME instructions, Dry Needling, Electrical stimulation, Cryotherapy, vasopneumatic device,Traction, Moist heat, Taping, Ultrasound, Ionotophoresis 4mg /ml Dexamethasone, and Manual therapy.  All included unless contraindicated  PLAN FOR NEXT SESSION:  Repeat DN if indicated, Review HEP use/response, postural exercises, manual as needed No estim until work up for thyroid nodule determined benign.     Narda Amber, PT, MPT 09/30/22 10:54 AM  PHYSICAL THERAPY DISCHARGE SUMMARY  Visits from Start of Care: 7 of 16  Current functional level related to goals / functional outcomes: See above   Remaining deficits: See above   Education / Equipment: HEP, TB   Patient  agrees to discharge. Patient goals were partially met. Patient is being discharged due to maximized rehab potential.   09/30/22 10:54 AM

## 2022-10-04 ENCOUNTER — Other Ambulatory Visit: Payer: Self-pay

## 2022-10-04 MED ORDER — AMIODARONE HCL 200 MG PO TABS: 100.0000 mg | ORAL_TABLET | ORAL | 3 refills | Status: DC

## 2022-10-07 ENCOUNTER — Encounter: Payer: Medicare Other | Admitting: Physical Therapy

## 2022-10-14 ENCOUNTER — Telehealth: Payer: Self-pay | Admitting: Physical Medicine and Rehabilitation

## 2022-10-14 NOTE — Telephone Encounter (Signed)
Patient asking if she is able to receive any more injections in her neck to make it feel better. Please advise

## 2022-10-15 NOTE — Telephone Encounter (Signed)
Called pt. Pain is radiating into biceps. Still getting relief from last inj. 20%-30%. No numbness or tingling. Pain with ROM in both shoulders. Right is worse than left.

## 2022-10-16 ENCOUNTER — Encounter: Payer: Self-pay | Admitting: Physical Medicine and Rehabilitation

## 2022-10-16 ENCOUNTER — Ambulatory Visit (INDEPENDENT_AMBULATORY_CARE_PROVIDER_SITE_OTHER): Payer: Medicare Other | Admitting: Physical Medicine and Rehabilitation

## 2022-10-16 DIAGNOSIS — M4802 Spinal stenosis, cervical region: Secondary | ICD-10-CM | POA: Diagnosis not present

## 2022-10-16 DIAGNOSIS — M5412 Radiculopathy, cervical region: Secondary | ICD-10-CM

## 2022-10-16 DIAGNOSIS — M47812 Spondylosis without myelopathy or radiculopathy, cervical region: Secondary | ICD-10-CM

## 2022-10-16 NOTE — Progress Notes (Signed)
[name] - 87 y.o. female MRN [id number]  Date of birth: [birth year]  Office Visit Note: Visit Date: 10/16/2022 PCP: [name], FNP Referred by: [name], FNP  Subjective: Chief Complaint  Patient presents with   Neck - Pain   HPI: TAHJ LINDSETH is a 87 y.o. female who comes in today for evaluation of chronic, worsening and severe bilateral neck pain radiating to shoulder and down to upper arms, left greater than right. Patient is somewhat of poor historian. Pain ongoing for several years, worsens with movement and activity. She describes pain as sore and aching sensation, currently rates as 7 out of 10. Some relief of pain with home exercise regimen, rest and use of medications. No relief with topical pain patches. Recent cervical MRI imaging exhibits multi level degenerative changes, moderate right sided foraminal stenosis at C5-C6, there is C1-C2 arthropathy with left-sided marrow edema. No high grade spinal canal stenosis noted. Patient recently underwent left C2-C3 facet joint injection in our office on 09/12/2022. She reports good relief of left sided neck pain with this procedure. She reports good range of motion to neck post injection, however bilateral upper arm pain remains. History of cervical myofascial trigger point injections performed in our office on 08/08/2022 with minimal relief of pain.  Patient ambulates with cane. She denies focal weakness, numbness and tingling. No recent trauma or falls.    Review of Systems  Musculoskeletal:  Positive for neck pain.  Neurological:  Negative for tingling, sensory change, focal weakness and weakness.  All other systems reviewed and are negative.  Otherwise per HPI.  Assessment & Plan: Visit Diagnoses:    ICD-10-CM   1. Radiculopathy, cervical region  M54.12 Ambulatory referral to Physical Medicine Rehab    2. Foraminal stenosis of cervical region  M48.02 Ambulatory referral to Physical Medicine Rehab    3.  Facet arthropathy, cervical  M47.812 Ambulatory referral to Physical Medicine Rehab       Plan: Findings:  Chronic, worsening and severe bilateral neck pain radiating to shoulder and down to upper arms, left greater than right. Patient continues to have severe pain despite good conservative therapies such as home exercise regimen, rest and use of medications. Patients clinical presentation and exam are consistent with cervical radiculopathy, more of C6 nerve pattern. Good relief of pain with previous left C2-C3 facet joint injections, she now has better range of motion to neck. Next step is to perform diagnostic and hopefully therapeutic left C7-T1 interlaminar epidural steroid injection under fluoroscopic guidance. She is currently taking Eliquis, will need to contact [facility] for permission to come off of this medication for injection. I did explain injection procedure to her in detail today, she has no questions at this time. No red flag symptoms noted upon exam today.     Meds & Orders: No orders of the defined types were placed in this encounter.   Orders Placed This Encounter  Procedures   Ambulatory referral to Physical Medicine Rehab    Follow-up: Return for Left C7-T1 interlaminar epidural steroid injection.   Procedures: No procedures performed      Clinical History: Narrative & Impression CLINICAL DATA:  Chronic neck pain.   EXAM: MRI CERVICAL SPINE WITHOUT CONTRAST   TECHNIQUE: Multiplanar, multisequence MR imaging of the cervical spine was performed. No intravenous contrast was administered.   COMPARISON:  Cervical spine radiographs 04/30/2022   FINDINGS: Alignment: Grade 1 anterolisthesis from C4-5 through T1-2, greatest C6-7 where it measures 4-5 mm. Mild-to-moderate  left convex curvature of the lower cervical spine.   Vertebrae: No fracture or suspicious marrow lesion. Moderate marrow edema associated with left-sided C1-2 arthropathy. Small  right-sided C1-2 joint effusion. Mild degenerative endplate edema at N8-2.   Cord: Normal signal.   Posterior Fossa, vertebral arteries, paraspinal tissues: 1.5 cm T2 hyperintense nodule in the posterior right thyroid lobe. Minor T2 hyperintensities in the pons, nonspecific but most often seen with chronic small vessel ischemia. Preserved vertebral artery flow voids.   Disc levels:   C2-3: Shallow right central disc protrusion without stenosis.   C3-4: Uncovertebral spurring and mild facet arthrosis result in mild-to-moderate right neural foraminal stenosis without spinal stenosis.   C4-5: Mild right and moderate to severe left facet arthrosis without stenosis.   C5-6: Severe disc space narrowing. Anterolisthesis with bulging uncovered disc, asymmetric right uncovertebral spurring, and moderate right and mild left facet arthrosis result in moderate right neural foraminal stenosis without spinal stenosis.   C6-7: Severe disc space narrowing. Anterolisthesis with disc uncovering, uncovertebral spurring, and moderate right and mild left facet arthrosis. No significant stenosis.   C7-T1: Anterolisthesis with disc uncovering and moderate right facet arthrosis. No stenosis.   T1-2: Anterolisthesis with disc uncovering and moderate right facet arthrosis. No stenosis.   IMPRESSION: 1. Multilevel cervical disc and facet degeneration without spinal stenosis. 2. Moderate right neural foraminal stenosis at C5-6. 3. Mild-to-moderate right neural foraminal stenosis at C3-4. 4. C1-2 arthropathy with left-sided marrow edema. 5. 1.5 cm incidental right thyroid nodule. Recommend non-emergent thyroid ultrasound. Reference: J Am Coll Radiol. 2015 Feb;12(2): 143-50     Electronically Signed   By: Sebastian Ache M.D.   On: 08/27/2022 12:08   She reports that she has never smoked. She has never used smokeless tobacco.  Recent Labs    05/21/22 1009 09/06/22 0905  HGBA1C 5.3 5.6     Objective:  VS:  HT:    WT:   BMI:     BP:   HR: bpm  TEMP: ( )  RESP:  Physical Exam Vitals and nursing note reviewed.  HENT:     Head: Normocephalic and atraumatic.     Right Ear: External ear normal.     Left Ear: External ear normal.     Nose: Nose normal.     Mouth/Throat:     Mouth: Mucous membranes are moist.  Eyes:     Extraocular Movements: Extraocular movements intact.  Cardiovascular:     Rate and Rhythm: Normal rate.     Pulses: Normal pulses.  Pulmonary:     Effort: Pulmonary effort is normal.  Abdominal:     General: Abdomen is flat. There is no distension.  Musculoskeletal:        General: Tenderness present.     Cervical back: Tenderness present.     Comments: No discomfort noted with flexion, extension and side-to-side rotation. Patient has good strength in the upper extremities including 5 out of 5 strength in wrist extension, long finger flexion and APB. Shoulder range of motion is full bilaterally without any sign of impingement. There is no atrophy of the hands intrinsically. Sensation intact bilaterally. Dysesthesias noted to bilateral C6 dermatomes. Negative Hoffman's sign. Negative Spurling's sign.     Skin:    General: Skin is warm and dry.     Capillary Refill: Capillary refill takes less than 2 seconds.  Neurological:     General: No focal deficit present.     Mental Status: She is alert and oriented to  person, place, and time.  Psychiatric:        Mood and Affect: Mood normal.        Behavior: Behavior normal.     Ortho Exam  Imaging: No results found.  Past Medical/Family/Surgical/Social History: Medications & Allergies reviewed per EMR, new medications updated. Patient Active Problem List   Diagnosis Date Noted   Thyroid nodule greater than or equal to 1.5 cm in diameter incidentally noted on imaging study 09/03/2022   Myofascial neck pain 09/03/2022   Lumbar radiculopathy 05/08/2022   Hyperlipidemia 05/08/2022   Cellulitis of  right forearm 01/22/2022   Sepsis secondary to UTI 12/26/2021   Acute pyelonephritis 12/25/2021   Acute metabolic encephalopathy 12/25/2021   Sepsis 12/25/2021   AKI (acute kidney injury) 12/25/2021   Hyponatremia 12/25/2021   Dehydration 12/25/2021   Atrial fibrillation, chronic 12/25/2021   Essential hypertension 12/25/2021   GERD (gastroesophageal reflux disease) 12/25/2021   Acquired trigger finger of left middle finger 08/04/2020   Arthritis of hand 08/04/2020   Osteoarthritis of metacarpophalangeal (MCP) joint 08/03/2020   Bilateral hand pain 06/22/2020   Osteoarthritis of hands, 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   Osteoarthritis of feet, 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   PAF (paroxysmal atrial fibrillation)    Chronic cough 12/29/2017   Atrial fibrillation with rapid ventricular response 12/23/2017   Acute on chronic combined systolic and diastolic CHF (congestive heart failure)    Menopausal symptom 10/17/2017   Menopausal syndrome 10/17/2017   Carotid stenosis 03/27/2017   S/P total knee arthroplasty, right 10/23/2016   Unilateral primary osteoarthritis, right knee 07/13/2016   Past Medical History:  Diagnosis Date   Arthritis    oa, all over - multiple areas    Carotid artery occlusion    Dysrhythmia    AFib    GERD (gastroesophageal reflux disease)    Headache    h/o migraines    Hypertension    PAF (paroxysmal atrial fibrillation)    a. multiple DCCV's in 2019 and eventually required initiation of Amiodarone with successful DCCV after this. b. recurrent in 12/2021 while admitted for Urosepsis   Family History  Problem Relation Age of Onset   Stroke Mother    Rheum arthritis Sister    Past Surgical History:  Procedure Laterality Date   ABDOMINAL HYSTERECTOMY     APPENDECTOMY     BACK SURGERY     CARDIOVERSION N/A 12/19/2017   Procedure: CARDIOVERSION;  Surgeon: Antoine Poche, MD;  Location: [facility];  Service: Endoscopy;  Laterality: N/A;   CARDIOVERSION N/A 12/26/2017    Procedure: CARDIOVERSION;  Surgeon: Chrystie Nose, MD;  Location: [place] ENDOSCOPY;  Service: Cardiovascular;  Laterality: N/A;   CARDIOVERSION N/A 01/26/2018   Procedure: CARDIOVERSION;  Surgeon: Jake Bathe, MD;  Location: [facility] ENDOSCOPY;  Service: Cardiovascular;  Laterality: N/A;   CAROTID ENDARTERECTOMY     CATARACT EXTRACTION W/PHACO Right 03/09/2021   Procedure: CATARACT EXTRACTION PHACO AND INTRAOCULAR LENS PLACEMENT with Placement of Corticosteroid (IOC);  Surgeon: Fabio Pierce, MD;  Location: [facility] ORS;  Service: Ophthalmology;  Laterality: Right;  CDE 9.16   CATARACT EXTRACTION W/PHACO Left 04/02/2021   Procedure: CATARACT EXTRACTION PHACO AND INTRAOCULAR LENS PLACEMENT LEFT EYE;  Surgeon: Fabio Pierce, MD;  Location: [facility] ORS;  Service: Ophthalmology;  Laterality: Left;  left CDE=6.75   ENDARTERECTOMY Left 03/27/2017   Procedure: ENDARTERECTOMY CAROTID LEFT;  Surgeon: Maeola Harman, MD;  Location: [facility] OR;  Service: Vascular;  Laterality: Left;   INNER EAR SURGERY Right    puntured  eardrum- repaired 2x's    KNEE ARTHROPLASTY     NASAL SINUS SURGERY     deviated septum   PATCH ANGIOPLASTY Left 03/27/2017   Procedure: PATCH ANGIOPLASTY LEFT CAROTID ARTERY USING XENOSURE BIOLOGIC PATCH;  Surgeon: Maeola Harman, MD;  Location: [facility] OR;  Service: Vascular;  Laterality: Left;   TONSILLECTOMY     TOTAL KNEE ARTHROPLASTY Right 10/23/2016   Procedure: RIGHT TOTAL KNEE ARTHROPLASTY;  Surgeon: Nadara Mustard, MD;  Location: [facility] OR;  Service: Orthopedics;  Laterality: Right;   Social History   Occupational History   Not on file  Tobacco Use   Smoking status: Never   Smokeless tobacco: Never  Vaping Use   Vaping Use: Never used  Substance and Sexual Activity   Alcohol use: No   Drug use: No   Sexual activity: Not Currently    Birth control/protection: Surgical

## 2022-10-16 NOTE — Progress Notes (Signed)
Functional Pain Scale - descriptive words and definitions  Distressing (6)    Pain is present/unable to complete most ADLs limited by pain/sleep is difficult and active distraction is only marginal. Moderate range order  Average Pain  varies  Neck pain on left that radiates into left shoulder and into left arm to the bicep

## 2022-10-18 ENCOUNTER — Telehealth: Payer: Self-pay | Admitting: *Deleted

## 2022-10-18 NOTE — Telephone Encounter (Signed)
   Pre-operative Risk Assessment    Patient Name: Lydia Graham  DOB: Mar 11, 1936 MRN: [id number]      Request for Surgical Clearance    Procedure:   Epidural steroid injection  Date of Surgery:  Clearance TBD                                 Surgeon:  Dr. Alvester Morin Surgeon's Group or Practice Name:  [facility] Phone number:  [phone] Fax number:  [phone]   Type of Clearance Requested:   - Medical  - Pharmacy:  Hold Apixaban (Eliquis) 2 days prior.   Type of Anesthesia:  Not Indicated   Additional requests/questions:  Pt had recent appt with Dr. Nelly Laurence on March 14,2024.  Pt also had this same clearance in December.   Signed, Emmit Pomfret   10/18/2022, 3:29 PM

## 2022-10-21 ENCOUNTER — Telehealth: Payer: Self-pay

## 2022-10-21 NOTE — Telephone Encounter (Signed)
  Patient Consent for Virtual Visit        Lydia Graham has provided verbal consent on 10/21/2022 for a virtual visit (video or telephone).   CONSENT FOR VIRTUAL VISIT FOR:  Lydia Graham  By participating in this virtual visit I agree to the following:  I hereby voluntarily request, consent and authorize [facility] and its employed or contracted physicians, physician assistants, nurse practitioners or other licensed health care professionals (the Practitioner), to provide me with telemedicine health care services (the "Services") as deemed necessary by the treating Practitioner. I acknowledge and consent to receive the Services by the Practitioner via telemedicine. I understand that the telemedicine visit will involve communicating with the Practitioner through live audiovisual communication technology and the disclosure of certain medical information by electronic transmission. I acknowledge that I have been given the opportunity to request an in-person assessment or other available alternative prior to the telemedicine visit and am voluntarily participating in the telemedicine visit.  I understand that I have the right to withhold or withdraw my consent to the use of telemedicine in the course of my care at any time, without affecting my right to future care or treatment, and that the Practitioner or I may terminate the telemedicine visit at any time. I understand that I have the right to inspect all information obtained and/or recorded in the course of the telemedicine visit and may receive copies of available information for a reasonable fee.  I understand that some of the potential risks of receiving the Services via telemedicine include:  Delay or interruption in medical evaluation due to technological equipment failure or disruption; Information transmitted may not be sufficient (e.g. poor resolution of images) to allow for appropriate medical decision making by the  Practitioner; and/or  In rare instances, security protocols could fail, causing a breach of personal health information.  Furthermore, I acknowledge that it is my responsibility to provide information about my medical history, conditions and care that is complete and accurate to the best of my ability. I acknowledge that Practitioner's advice, recommendations, and/or decision may be based on factors not within their control, such as incomplete or inaccurate data provided by me or distortions of diagnostic images or specimens that may result from electronic transmissions. I understand that the practice of medicine is not an exact science and that Practitioner makes no warranties or guarantees regarding treatment outcomes. I acknowledge that a copy of this consent can be made available to me via my patient portal [facility] MyChart), or I can request a printed copy by calling the office of [facility].    I understand that my insurance will be billed for this visit.   I have read or had this consent read to me. I understand the contents of this consent, which adequately explains the benefits and risks of the Services being provided via telemedicine.  I have been provided ample opportunity to ask questions regarding this consent and the Services and have had my questions answered to my satisfaction. I give my informed consent for the services to be provided through the use of telemedicine in my medical care

## 2022-10-21 NOTE — Telephone Encounter (Signed)
Spoke with the patient and she was rude and hesitant at first about appointment but finally became agreeable.   Med list reviewed and consent given.

## 2022-10-21 NOTE — Telephone Encounter (Signed)
   Name: Lydia Graham  DOB: 03-04-1936  MRN: [id number]  Primary Cardiologist: [name], MD   Preoperative team, please contact this patient and set up a phone call appointment for further preoperative risk assessment. Please obtain consent and complete medication review. Thank you for your help.  I confirm that guidance regarding antiplatelet and oral anticoagulation therapy has been completed and, if necessary, noted below.  Per office protocol, patient can hold Eliquis for 3 days prior to procedure.       Napoleon Form, Leodis Rains, NP 10/21/2022, 7:55 AM [facility]

## 2022-10-21 NOTE — Telephone Encounter (Signed)
Patient with diagnosis of afib on Eliquis for anticoagulation.    Procedure: ESI Date of procedure: TBD  CHA2DS2-VASc Score = 6  This indicates a 9.7% annual risk of stroke. The patient's score is based upon: CHF History: 1 HTN History: 1 Diabetes History: 0 Stroke History: 0 Vascular Disease History: 1 Age Score: 2 Gender Score: 1   CrCl 67mL/min Platelet count 398K  Per office protocol, patient can hold Eliquis for 3 days prior to procedure.    **This guidance is not considered finalized until pre-operative APP has relayed final recommendations.**

## 2022-10-23 NOTE — Progress Notes (Signed)
Virtual Visit via Telephone Note   Because of Lydia Graham Lydia Graham's co-morbid illnesses, she is at least at moderate risk for complications without adequate follow up.  This format is felt to be most appropriate for this patient at this time.  The patient did not have access to video technology/had technical difficulties with video requiring transitioning to audio format only (telephone).  All issues noted in this document were discussed and addressed.  No physical exam could be performed with this format.  Please refer to the patient's chart for her consent to telehealth for [facility].  Evaluation Performed:  Preoperative cardiovascular risk assessment _____________   Date:  10/23/2022   Patient ID:  Lydia Graham, DOB 1936-02-07, MRN [id number] Patient Location:  Home Provider location:   Office  Primary Care Provider:  [name], FNP Primary Cardiologist:  [name], MD  Chief Complaint / Patient Profile   87 y.o. y/o female with a Graham/o paroxysmal atrial fibrillation, HTN who is pending ESI and presents today for telephonic preoperative cardiovascular risk assessment.  History of Present Illness    Lydia Graham Zeltner is a 87 y.o. female who presents via audio/video conferencing for a telehealth visit today.  Pt was last seen in cardiology clinic on 09/05/2022 by Dr. Nelly Laurence.  At that time Lydia Graham was doing well .  The patient is now pending procedure as outlined above. Since her last visit, she remained stable from a cardiac standpoint.  Today she denies chest pain, shortness of breath, lower extremity edema, fatigue, palpitations, melena, hematuria, hemoptysis, diaphoresis, weakness, presyncope, syncope, orthopnea, and PND.   Past Medical History    Past Medical History:  Diagnosis Date   Arthritis    oa, all over - multiple areas    Carotid artery occlusion    Dysrhythmia    AFib    GERD (gastroesophageal reflux disease)    Headache     Graham/o migraines    Hypertension    PAF (paroxysmal atrial fibrillation) (HCC)    a. multiple DCCV's in 2019 and eventually required initiation of Amiodarone with successful DCCV after this. b. recurrent in 12/2021 while admitted for Urosepsis   Past Surgical History:  Procedure Laterality Date   ABDOMINAL HYSTERECTOMY     APPENDECTOMY     BACK SURGERY     CARDIOVERSION N/A 12/19/2017   Procedure: CARDIOVERSION;  Surgeon: Antoine Poche, MD;  [facility];  Service: Endoscopy;  Laterality: N/A;   CARDIOVERSION N/A 12/26/2017   Procedure: CARDIOVERSION;  Surgeon: Chrystie Nose, MD;  Location: [facility] ENDOSCOPY;  Service: Cardiovascular;  Laterality: N/A;   CARDIOVERSION N/A 01/26/2018   Procedure: CARDIOVERSION;  Surgeon: Jake Bathe, MD;  Location: [facility] ENDOSCOPY;  Service: Cardiovascular;  Laterality: N/A;   CAROTID ENDARTERECTOMY     CATARACT EXTRACTION W/PHACO Right 03/09/2021   Procedure: CATARACT EXTRACTION PHACO AND INTRAOCULAR LENS PLACEMENT with Placement of Corticosteroid (IOC);  Surgeon: Fabio Pierce, MD;  Location: [facility] ORS;  Service: Ophthalmology;  Laterality: Right;  CDE 9.16   CATARACT EXTRACTION W/PHACO Left 04/02/2021   Procedure: CATARACT EXTRACTION PHACO AND INTRAOCULAR LENS PLACEMENT LEFT EYE;  Surgeon: Fabio Pierce, MD;  Location: [facility] ORS;  Service: Ophthalmology;  Laterality: Left;  left CDE=6.75   ENDARTERECTOMY Left 03/27/2017   Procedure: ENDARTERECTOMY CAROTID LEFT;  Surgeon: Maeola Harman, MD;  Location: [facility] OR;  Service: Vascular;  Laterality: Left;   INNER EAR SURGERY Right    puntured eardrum- repaired 2x's  KNEE ARTHROPLASTY     NASAL SINUS SURGERY     deviated septum   PATCH ANGIOPLASTY Left 03/27/2017   Procedure: PATCH ANGIOPLASTY LEFT CAROTID ARTERY USING Livia Snellen BIOLOGIC PATCH;  Surgeon: Maeola Harman, MD;  Location: [facility] OR;  Service: Vascular;  Laterality: Left;   TONSILLECTOMY     TOTAL KNEE ARTHROPLASTY Right  10/23/2016   Procedure: RIGHT TOTAL KNEE ARTHROPLASTY;  Surgeon: Nadara Mustard, MD;  Location: [facility] OR;  Service: Orthopedics;  Laterality: Right;    Allergies  Allergies  Allergen Reactions   Rosuvastatin Other (See Comments)    MYALGIA, Muscle pain   Lisinopril Cough   Other Other (See Comments)    PAIN MEDICATIONS/ANESTHESIA  MADE BP DROP LOW    Singulair [Montelukast Sodium] Other (See Comments)    fatigue    Home Medications    Prior to Admission medications   Medication Sig Start Date End Date Taking? Authorizing Provider  amiodarone (PACERONE) 200 MG tablet Take 0.5 tablets (100 mg total) by mouth every other day. 10/04/22   Mealor, Roberts Gaudy, MD  amLODipine (NORVASC) 5 MG tablet TAKE 1 TABLET AT 8AM AND 1 TABLET AT 8PM. 07/26/22   Antoine Poche, MD  apixaban (ELIQUIS) 5 MG TABS tablet Take 1 tablet (5 mg total) by mouth 2 (two) times daily. 09/10/22   Mealor, Roberts Gaudy, MD  Calcium Carbonate-Vitamin D (CALCIUM 600+D PO) Take 1 tablet by mouth daily.    [provider]  Camphor-Menthol-Methyl Sal (SALONPAS) 3.06-29-08 % PTCH Place 1 patch onto the skin at bedtime as needed (foot pain).    [provider]  estradiol (ESTRACE) 0.5 MG tablet Take 0.5 mg by mouth daily.  04/18/16   [provider]  furosemide (LASIX) 20 MG tablet Take 1 tablet (20 mg total) by mouth as needed for fluid or edema. 05/03/22   Jodelle Gross, NP  Hypromellose (ARTIFICIAL TEARS OP) Place 2 drops into both eyes 2 (two) times daily as needed (for dry eyes).    [provider]  losartan (COZAAR) 50 MG tablet Take 1 tablet (50 mg total) by mouth 2 (two) times daily. 07/09/22   Antoine Poche, MD  magnesium hydroxide (MILK OF MAGNESIA) 400 MG/5ML suspension Take 30 mLs by mouth daily as needed for mild constipation.    [provider]  metoprolol succinate (TOPROL-XL) 50 MG 24 hr tablet TAKE 1 TABLET AT LUNCH OR IMMEDIATELY FOLLOWING A MEAL. 11/07/21   Allred,  Fayrene Fearing, MD  pantoprazole (PROTONIX) 40 MG tablet TAKE (1) TABLET TWICE DAILY. 07/26/22   Antoine Poche, MD  vitamin B-12 (CYANOCOBALAMIN) 1000 MCG tablet Take 1,000 mcg by mouth daily.    [provider]    Physical Exam    Vital Signs:  Richard Miu Moncion does not have vital signs available for review today.  Given telephonic nature of communication, physical exam is limited. AAOx3. NAD. Normal affect.  Speech and respirations are unlabored.  Accessory Clinical Findings    None  Assessment & Plan    1.  Preoperative Cardiovascular Risk Assessment: ESI, Dr. Alvester Morin, Ortho care at [facility], [phone]      Primary Cardiologist: [name], MD  Chart reviewed as part of pre-operative protocol coverage. Given past medical history and time since last visit, based on ACC/AHA guidelines, Lydia Graham Teigland would be at acceptable risk for the planned procedure without further cardiovascular testing.   Patient was advised that if she develops new symptoms prior to surgery to  contact our office to arrange a follow-up appointment.  She verbalized understanding.  Patient with diagnosis of afib on Eliquis for anticoagulation.     Procedure: ESI Date of procedure: TBD   CHA2DS2-VASc Score = 6  This indicates a 9.7% annual risk of stroke. The patient's score is based upon: CHF History: 1 HTN History: 1 Diabetes History: 0 Stroke History: 0 Vascular Disease History: 1 Age Score: 2 Gender Score: 1   CrCl 68mL/min Platelet count 398K   Per office protocol, patient can hold Eliquis for 3 days prior to procedure.    I will route this recommendation to the requesting party via Epic fax function and remove from pre-op pool.  Please call with questions.       Time:   Today, I have spent 5 minutes with the patient with telehealth technology discussing medical history, symptoms, and management plan.  Prior to his phone evaluation I spent greater than 10 minutes  reviewing his past medical history and cardiac medications.   Ronney Asters, NP  10/23/2022, 2:20 PM

## 2022-10-25 ENCOUNTER — Ambulatory Visit: Payer: Medicare Other | Attending: Cardiovascular Disease

## 2022-10-25 DIAGNOSIS — Z01810 Encounter for preprocedural cardiovascular examination: Secondary | ICD-10-CM | POA: Diagnosis not present

## 2022-10-28 ENCOUNTER — Telehealth: Payer: Self-pay | Admitting: Physical Medicine and Rehabilitation

## 2022-10-28 NOTE — Telephone Encounter (Signed)
Patient states she has been cleared to get her injections, she is waiting on a call from someone to schedule

## 2022-10-28 NOTE — Telephone Encounter (Signed)
Spoke with patient and scheduled injection for 11/05/22. Patient aware driver needed  

## 2022-11-05 ENCOUNTER — Ambulatory Visit (INDEPENDENT_AMBULATORY_CARE_PROVIDER_SITE_OTHER): Payer: Medicare Other | Admitting: Physical Medicine and Rehabilitation

## 2022-11-05 ENCOUNTER — Other Ambulatory Visit: Payer: Self-pay

## 2022-11-05 VITALS — BP 160/74 | HR 68

## 2022-11-05 DIAGNOSIS — M5416 Radiculopathy, lumbar region: Secondary | ICD-10-CM | POA: Diagnosis not present

## 2022-11-05 MED ORDER — METHYLPREDNISOLONE ACETATE 80 MG/ML IJ SUSP
80.0000 mg | Freq: Once | INTRAMUSCULAR | Status: AC
  Administered 2022-11-05: 80 mg

## 2022-11-05 NOTE — Progress Notes (Signed)
Functional Pain Scale - descriptive words and definitions  Unmanageable (7)  Pain interferes with normal ADL's/nothing seems to help/sleep is very difficult/active distractions are very difficult to concentrate on. Severe range order  Average Pain 9   +Driver, -BT- stopped 0/98/11, -Dye Allergies.  Neck pain on both sides that radiates into both shoulders

## 2022-11-05 NOTE — Patient Instructions (Signed)

## 2022-11-06 ENCOUNTER — Other Ambulatory Visit: Payer: Self-pay | Admitting: Cardiology

## 2022-11-07 ENCOUNTER — Ambulatory Visit: Payer: Medicare Other | Admitting: Family Medicine

## 2022-11-12 ENCOUNTER — Encounter: Payer: Self-pay | Admitting: Cardiology

## 2022-11-12 ENCOUNTER — Ambulatory Visit: Payer: Medicare Other | Attending: Cardiology | Admitting: Cardiology

## 2022-11-12 VITALS — BP 134/60 | HR 75 | Resp 21 | Ht 67.0 in | Wt 148.4 lb

## 2022-11-12 DIAGNOSIS — I480 Paroxysmal atrial fibrillation: Secondary | ICD-10-CM

## 2022-11-12 DIAGNOSIS — D6869 Other thrombophilia: Secondary | ICD-10-CM | POA: Diagnosis not present

## 2022-11-12 DIAGNOSIS — I10 Essential (primary) hypertension: Secondary | ICD-10-CM

## 2022-11-12 NOTE — Patient Instructions (Signed)
Medication Instructions:  Continue all current medications.   Labwork: none  Testing/Procedures: none  Follow-Up: 6 months   Any Other Special Instructions Will Be Listed Below (If Applicable).   If you need a refill on your cardiac medications before your next appointment, please call your pharmacy.  

## 2022-11-12 NOTE — Progress Notes (Signed)
Clinical Summary [name] is a 87 y.o.female  1.Afib - followed by EP Dr Nelly Laurence - has been on amio 100mg  qod - compliant with meds - no bleeding on eliquis   -no recent palpitations - 08/2022 normal TSH and LFTs       2.Systolic dysfunction 12/2021 echo LVEF 45-50%, no MR. This was in setting of admission with sepsis, afib with RVR 03/2022 echo LVEF 55-60%, no WMAs, grade II dd      3. HTN - checks bp at home once a week.     4. Bilateral LE edema - no recent issues   5. Carotid stenosis - prior CEA - 05/2021 study 1-39% bilateral disease 05/2022 RICA 1-39%, LICA 1-39%   6. Mitral regurgitation - moderate by echo 2019 12/2021 echo LVEF 45-50%, no MR - 03/2022 echo LVEF 55-60%, mild MR     Past Medical History:  Diagnosis Date   Arthritis    oa, all over - multiple areas    Carotid artery occlusion    Dysrhythmia    AFib    GERD (gastroesophageal reflux disease)    Headache    h/o migraines    Hypertension    PAF (paroxysmal atrial fibrillation) (HCC)    a. multiple DCCV's in 2019 and eventually required initiation of Amiodarone with successful DCCV after this. b. recurrent in 12/2021 while admitted for Urosepsis     Allergies  Allergen Reactions   Rosuvastatin Other (See Comments)    MYALGIA, Muscle pain   Lisinopril Cough   Other Other (See Comments)    PAIN MEDICATIONS/ANESTHESIA  MADE BP DROP LOW    Singulair [Montelukast Sodium] Other (See Comments)    fatigue     Current Outpatient Medications  Medication Sig Dispense Refill   amiodarone (PACERONE) 200 MG tablet Take 0.5 tablets (100 mg total) by mouth every other day. 45 tablet 3   amLODipine (NORVASC) 5 MG tablet TAKE 1 TABLET AT 8AM AND 1 TABLET AT 8PM. 60 tablet 6   apixaban (ELIQUIS) 5 MG TABS tablet Take 1 tablet (5 mg total) by mouth 2 (two) times daily. 60 tablet 5   Calcium Carbonate-Vitamin D (CALCIUM 600+D PO) Take 1 tablet by mouth daily.     Camphor-Menthol-Methyl  Sal (SALONPAS) 3.06-29-08 % PTCH Place 1 patch onto the skin at bedtime as needed (foot pain).     estradiol (ESTRACE) 0.5 MG tablet Take 0.5 mg by mouth daily.      furosemide (LASIX) 20 MG tablet Take 1 tablet (20 mg total) by mouth as needed for fluid or edema. 30 tablet 2   Hypromellose (ARTIFICIAL TEARS OP) Place 2 drops into both eyes 2 (two) times daily as needed (for dry eyes).     losartan (COZAAR) 50 MG tablet take 1 tablet at 10 IN THE MORNING and 1 tablet at 10 pm. 60 tablet 9   magnesium hydroxide (MILK OF MAGNESIA) 400 MG/5ML suspension Take 30 mLs by mouth daily as needed for mild constipation.     metoprolol succinate (TOPROL-XL) 50 MG 24 hr tablet TAKE 1 TABLET AT LUNCH OR IMMEDIATELY FOLLOWING A MEAL. 30 tablet 6   pantoprazole (PROTONIX) 40 MG tablet TAKE (1) TABLET TWICE DAILY. 60 tablet 6   vitamin B-12 (CYANOCOBALAMIN) 1000 MCG tablet Take 1,000 mcg by mouth daily.     Current Facility-Administered Medications  Medication Dose Route Frequency Provider Last Rate Last Admin   methylPREDNISolone acetate (DEPO-MEDROL) injection 80 mg  80 mg Other  Once Tyrell Antonio, MD         Past Surgical History:  Procedure Laterality Date   ABDOMINAL HYSTERECTOMY     APPENDECTOMY     BACK SURGERY     CARDIOVERSION N/A 12/19/2017   Procedure: CARDIOVERSION;  Surgeon: Antoine Poche, MD;  [facility];  Service: Endoscopy;  Laterality: N/A;   CARDIOVERSION N/A 12/26/2017   Procedure: CARDIOVERSION;  Surgeon: Chrystie Nose, MD;  Location: [facility] ENDOSCOPY;  Service: Cardiovascular;  Laterality: N/A;   CARDIOVERSION N/A 01/26/2018   Procedure: CARDIOVERSION;  Surgeon: Jake Bathe, MD;  Location: [facility] ENDOSCOPY;  Service: Cardiovascular;  Laterality: N/A;   CAROTID ENDARTERECTOMY     CATARACT EXTRACTION W/PHACO Right 03/09/2021   Procedure: CATARACT EXTRACTION PHACO AND INTRAOCULAR LENS PLACEMENT with Placement of Corticosteroid (IOC);  Surgeon: Fabio Pierce, MD;  Location:  [facility] ORS;  Service: Ophthalmology;  Laterality: Right;  CDE 9.16   CATARACT EXTRACTION W/PHACO Left 04/02/2021   Procedure: CATARACT EXTRACTION PHACO AND INTRAOCULAR LENS PLACEMENT LEFT EYE;  Surgeon: Fabio Pierce, MD;  Location: [facility] ORS;  Service: Ophthalmology;  Laterality: Left;  left CDE=6.75   ENDARTERECTOMY Left 03/27/2017   Procedure: ENDARTERECTOMY CAROTID LEFT;  Surgeon: Maeola Harman, MD;  Location: [facility] OR;  Service: Vascular;  Laterality: Left;   INNER EAR SURGERY Right    puntured eardrum- repaired 2x's    KNEE ARTHROPLASTY     NASAL SINUS SURGERY     deviated septum   PATCH ANGIOPLASTY Left 03/27/2017   Procedure: PATCH ANGIOPLASTY LEFT CAROTID ARTERY USING Livia Snellen BIOLOGIC PATCH;  Surgeon: Maeola Harman, MD;  Location: [facility] OR;  Service: Vascular;  Laterality: Left;   TONSILLECTOMY     TOTAL KNEE ARTHROPLASTY Right 10/23/2016   Procedure: RIGHT TOTAL KNEE ARTHROPLASTY;  Surgeon: Nadara Mustard, MD;  Location: [facility] OR;  Service: Orthopedics;  Laterality: Right;     Allergies  Allergen Reactions   Rosuvastatin Other (See Comments)    MYALGIA, Muscle pain   Lisinopril Cough   Other Other (See Comments)    PAIN MEDICATIONS/ANESTHESIA  MADE BP DROP LOW    Singulair [Montelukast Sodium] Other (See Comments)    fatigue      Family History  Problem Relation Age of Onset   Stroke Mother    Rheum arthritis Sister      Social History [name] reports that she has never smoked. She has never used smokeless tobacco. [name] reports no history of alcohol use.   Review of Systems CONSTITUTIONAL: No weight loss, fever, chills, weakness or fatigue.  HEENT: Eyes: No visual loss, blurred vision, double vision or yellow sclerae.No hearing loss, sneezing, congestion, runny nose or sore throat.  SKIN: No rash or itching.  CARDIOVASCULAR: per hpi RESPIRATORY: No shortness of breath, cough or sputum.  GASTROINTESTINAL: No anorexia, nausea, vomiting or  diarrhea. No abdominal pain or blood.  GENITOURINARY: No burning on urination, no polyuria NEUROLOGICAL: No headache, dizziness, syncope, paralysis, ataxia, numbness or tingling in the extremities. No change in bowel or bladder control.  MUSCULOSKELETAL: No muscle, back pain, joint pain or stiffness.  LYMPHATICS: No enlarged nodes. No history of splenectomy.  PSYCHIATRIC: No history of depression or anxiety.  ENDOCRINOLOGIC: No reports of sweating, cold or heat intolerance. No polyuria or polydipsia.  Marland Kitchen   Physical Examination Vitals:   11/12/22 0818 11/12/22 0840  BP: (!) 142/58 134/60  Pulse: 75   Resp: (!) 21   SpO2: 98%    Filed Weights  11/12/22 0818  Weight: 148 lb 6.4 oz (67.3 kg)    Gen: resting comfortably, no acute distress HEENT: no scleral icterus, pupils equal round and reactive, no palptable cervical adenopathy,  CV: RRR, no m/r/g. Left carotid bruit Resp: Clear to auscultation bilaterally GI: abdomen is soft, non-tender, non-distended, normal bowel sounds, no hepatosplenomegaly MSK: extremities are warm, no edema.  Skin: warm, no rash Neuro:  no focal deficits Psych: appropriate affect   Diagnostic Studies 02/2021 echo Global longitudinal strain was attempted.  IMPRESSIONS     1. Left ventricular ejection fraction, by estimation, is 65 to 70%. The  left ventricle has normal function. The left ventricle has no regional  wall motion abnormalities. Left ventricular diastolic parameters are  indeterminate.   2. Right ventricular systolic function is normal. The right ventricular  size is normal. There is mildly elevated pulmonary artery systolic  pressure.   3. Left atrial size was mildly dilated.   4. The mitral valve is normal in structure. Trivial mitral valve  regurgitation. No evidence of mitral stenosis.   5. The aortic valve is tricuspid. Aortic valve regurgitation is not  visualized. No aortic stenosis is present.   6. The inferior vena cava is  normal in size with greater than 50%  respiratory variability, suggesting right atrial pressure of 3 mmHg.   Comparison(s): Previous Echo was technically challenging due to A-fib. LV  EF was @50 %, with moderate MR and TR, and biatrial enlargement.     12/2021 echo    1. Left ventricular ejection fraction, by estimation, is 45 to 50%. The  left ventricle has mildly decreased function. The left ventricle has no  regional wall motion abnormalities. There is mild left ventricular  hypertrophy. Left ventricular diastolic  parameters are indeterminate.   2. Right ventricular systolic function is normal. The right ventricular  size is normal. There is mildly elevated pulmonary artery systolic  pressure.   3. Left atrial size was mildly dilated.   4. Right atrial size was mildly dilated.   5. The mitral valve is normal in structure. No evidence of mitral valve  regurgitation. No evidence of mitral stenosis.   6. The aortic valve is normal in structure. Aortic valve regurgitation is  not visualized. No aortic stenosis is present.   7. The inferior vena cava is normal in size with greater than 50%  respiratory variability, suggesting right atrial pressure of 3 mmHg.     05/2021 carotid US Summary:  Right Carotid: Velocities in the right ICA are consistent with a 1-39%  stenosis.   Left Carotid: Velocities in the left ICA are consistent with a 1-39%  stenosis.   Vertebrals:  Bilateral vertebral arteries demonstrate antegrade flow.  Subclavians: Normal flow hemodynamics were seen in bilateral subclavian               arteries.     03/2022 echo 1. Left ventricular ejection fraction, by estimation, is 55 to 60%. The  left ventricle has normal function. The left ventricle has no regional  wall motion abnormalities. There is mild asymmetric left ventricular  hypertrophy of the basal segment. Left  ventricular diastolic parameters are consistent with Grade II diastolic  dysfunction  (pseudonormalization). The average left ventricular global  longitudinal strain is -19.8 %. The global longitudinal strain is normal.   2. Right ventricular systolic function is normal. The right ventricular  size is normal. There is mildly elevated pulmonary artery systolic  pressure. The estimated right ventricular systolic pressure is 40.7  mmHg.   3. Left atrial size was mildly dilated.   4. The mitral valve is grossly normal. Mild mitral valve regurgitation.   5. Tricuspid valve regurgitation is mild to moderate.   6. The aortic valve is tricuspid. Aortic valve regurgitation is not  visualized. Aortic valve sclerosis is present, with no evidence of aortic  valve stenosis.   7. The inferior vena cava is normal in size with greater than 50%  respiratory variability, suggesting right atrial pressure of 3 mmHg.   Assessment and Plan  Afib/acquired thrombophilia -doing well on current therapy, conitnue current meds including eliquis for stroke prevention   2. HTN -overall at goal, continue current meds   3. Systolic dysfunction - new finding during recent admission with a fib with RVR and sepsis,  - transient, most recent echo shows normalized function   4. Mitral regurgitation - mild by echo, continue to monitor         Antoine Poche, M.D.

## 2022-11-13 ENCOUNTER — Other Ambulatory Visit: Payer: Self-pay | Admitting: Cardiology

## 2022-11-13 DIAGNOSIS — L723 Sebaceous cyst: Secondary | ICD-10-CM | POA: Diagnosis not present

## 2022-11-13 NOTE — Procedures (Signed)
Cervical Epidural Steroid Injection - Interlaminar Approach with Fluoroscopic Guidance  Patient: Lydia Graham      Date of Birth: May 28, 1936 MRN: [id number] PCP: [name], FNP      Visit Date: 11/05/2022   Universal Protocol:    Date/Time: 05/22/248:53 AM  Consent Given By: the patient  Position: PRONE  Additional Comments: Vital signs were monitored before and after the procedure. Patient was prepped and draped in the usual sterile fashion. The correct patient, procedure, and site was verified.   Injection Procedure Details:   Procedure diagnoses: Lumbar radiculopathy [M54.16]    Meds Administered:  Meds ordered this encounter  Medications   methylPREDNISolone acetate (DEPO-MEDROL) injection 80 mg     Laterality: Left  Location/Site: C7-T1  Needle: 3.5 in., 20 ga. Tuohy  Needle Placement: Paramedian epidural space  Findings:  -Comments: Excellent flow of contrast into the epidural space.  Procedure Details: Using a paramedian approach from the side mentioned above, the region overlying the inferior lamina was localized under fluoroscopic visualization and the soft tissues overlying this structure were infiltrated with 4 ml. of 1% Lidocaine without Epinephrine. A # 20 gauge, Tuohy needle was inserted into the epidural space using a paramedian approach.  The epidural space was localized using loss of resistance along with contralateral oblique bi-planar fluoroscopic views.  After negative aspirate for air, blood, and CSF, a 2 ml. volume of Isovue-250 was injected into the epidural space and the flow of contrast was observed. Radiographs were obtained for documentation purposes.   The injectate was administered into the level noted above.  Additional Comments:  No complications occurred Dressing: 2 x 2 sterile gauze and Band-Aid    Post-procedure details: Patient was observed during the procedure. Post-procedure instructions were reviewed.  Patient  left the clinic in stable condition.

## 2022-11-13 NOTE — Progress Notes (Signed)
Lydia Graham - 87 y.o. female MRN [id number]  Date of birth: [birth year]  Office Visit Note: Visit Date: 11/05/2022 PCP: [name], FNP Referred by: [name], FNP  Subjective: Chief Complaint  Patient presents with   Neck - Pain   HPI:  Lydia Graham is a 87 y.o. female who comes in today at the request of Ellin Goodie, FNP for planned Left C7-T1 Cervical Interlaminar epidural steroid injection with fluoroscopic guidance.  The patient has failed conservative care including home exercise, medications, time and activity modification.  This injection will be diagnostic and hopefully therapeutic.  Please see requesting physician notes for further details and justification.   ROS Otherwise per HPI.  Assessment & Plan: Visit Diagnoses:    ICD-10-CM   1. Lumbar radiculopathy  M54.16 XR C-ARM NO REPORT    Epidural Steroid injection    methylPREDNISolone acetate (DEPO-MEDROL) injection 80 mg      Plan: No additional findings.   Meds & Orders:  Meds ordered this encounter  Medications   methylPREDNISolone acetate (DEPO-MEDROL) injection 80 mg    Orders Placed This Encounter  Procedures   XR C-ARM NO REPORT   Epidural Steroid injection    Follow-up: Return if symptoms worsen or fail to improve.   Procedures: No procedures performed  Cervical Epidural Steroid Injection - Interlaminar Approach with Fluoroscopic Guidance  Patient: Lydia Graham      Date of Birth: August 07, 1935 MRN: [id number] PCP: [name], FNP      Visit Date: 11/05/2022   Universal Protocol:    Date/Time: 05/22/248:53 AM  Consent Given By: the patient  Position: PRONE  Additional Comments: Vital signs were monitored before and after the procedure. Patient was prepped and draped in the usual sterile fashion. The correct patient, procedure, and site was verified.   Injection Procedure Details:   Procedure diagnoses: Lumbar radiculopathy [M54.16]    Meds  Administered:  Meds ordered this encounter  Medications   methylPREDNISolone acetate (DEPO-MEDROL) injection 80 mg     Laterality: Left  Location/Site: C7-T1  Needle: 3.5 in., 20 ga. Tuohy  Needle Placement: Paramedian epidural space  Findings:  -Comments: Excellent flow of contrast into the epidural space.  Procedure Details: Using a paramedian approach from the side mentioned above, the region overlying the inferior lamina was localized under fluoroscopic visualization and the soft tissues overlying this structure were infiltrated with 4 ml. of 1% Lidocaine without Epinephrine. A # 20 gauge, Tuohy needle was inserted into the epidural space using a paramedian approach.  The epidural space was localized using loss of resistance along with contralateral oblique bi-planar fluoroscopic views.  After negative aspirate for air, blood, and CSF, a 2 ml. volume of Isovue-250 was injected into the epidural space and the flow of contrast was observed. Radiographs were obtained for documentation purposes.   The injectate was administered into the level noted above.  Additional Comments:  No complications occurred Dressing: 2 x 2 sterile gauze and Band-Aid    Post-procedure details: Patient was observed during the procedure. Post-procedure instructions were reviewed.  Patient left the clinic in stable condition.    Clinical History: Narrative & Impression CLINICAL DATA:  Chronic neck pain.   EXAM: MRI CERVICAL SPINE WITHOUT CONTRAST   TECHNIQUE: Multiplanar, multisequence MR imaging of the cervical spine was performed. No intravenous contrast was administered.   COMPARISON:  Cervical spine radiographs 04/30/2022   FINDINGS: Alignment: Grade 1 anterolisthesis from C4-5 through T1-2, greatest C6-7 where it measures 4-5 mm.  Mild-to-moderate left convex curvature of the lower cervical spine.   Vertebrae: No fracture or suspicious marrow lesion. Moderate marrow edema associated  with left-sided C1-2 arthropathy. Small right-sided C1-2 joint effusion. Mild degenerative endplate edema at Z6-1.   Cord: Normal signal.   Posterior Fossa, vertebral arteries, paraspinal tissues: 1.5 cm T2 hyperintense nodule in the posterior right thyroid lobe. Minor T2 hyperintensities in the pons, nonspecific but most often seen with chronic small vessel ischemia. Preserved vertebral artery flow voids.   Disc levels:   C2-3: Shallow right central disc protrusion without stenosis.   C3-4: Uncovertebral spurring and mild facet arthrosis result in mild-to-moderate right neural foraminal stenosis without spinal stenosis.   C4-5: Mild right and moderate to severe left facet arthrosis without stenosis.   C5-6: Severe disc space narrowing. Anterolisthesis with bulging uncovered disc, asymmetric right uncovertebral spurring, and moderate right and mild left facet arthrosis result in moderate right neural foraminal stenosis without spinal stenosis.   C6-7: Severe disc space narrowing. Anterolisthesis with disc uncovering, uncovertebral spurring, and moderate right and mild left facet arthrosis. No significant stenosis.   C7-T1: Anterolisthesis with disc uncovering and moderate right facet arthrosis. No stenosis.   T1-2: Anterolisthesis with disc uncovering and moderate right facet arthrosis. No stenosis.   IMPRESSION: 1. Multilevel cervical disc and facet degeneration without spinal stenosis. 2. Moderate right neural foraminal stenosis at C5-6. 3. Mild-to-moderate right neural foraminal stenosis at C3-4. 4. C1-2 arthropathy with left-sided marrow edema. 5. 1.5 cm incidental right thyroid nodule. Recommend non-emergent thyroid ultrasound. Reference: J Am Coll Radiol. 2015 Feb;12(2): 143-50     Electronically Signed   By: Sebastian Ache M.D.   On: 08/27/2022 12:08     Objective:  VS:  HT:    WT:   BMI:     BP:(!) 160/74  HR:68bpm  TEMP: ( )  RESP:  Physical  Exam Vitals and nursing note reviewed.  Constitutional:      General: She is not in acute distress.    Appearance: Normal appearance. She is not ill-appearing.  HENT:     Head: Normocephalic and atraumatic.     Right Ear: External ear normal.     Left Ear: External ear normal.  Eyes:     Extraocular Movements: Extraocular movements intact.  Cardiovascular:     Rate and Rhythm: Normal rate.     Pulses: Normal pulses.  Musculoskeletal:     Cervical back: Tenderness present. No rigidity.     Right lower leg: No edema.     Left lower leg: No edema.     Comments: Patient has good strength in the upper extremities including 5 out of 5 strength in wrist extension long finger flexion and APB.  There is no atrophy of the hands intrinsically.  There is a negative Hoffmann's test.   Lymphadenopathy:     Cervical: No cervical adenopathy.  Skin:    Findings: No erythema, lesion or rash.  Neurological:     General: No focal deficit present.     Mental Status: She is alert and oriented to person, place, and time.     Sensory: No sensory deficit.     Motor: No weakness or abnormal muscle tone.     Coordination: Coordination normal.  Psychiatric:        Mood and Affect: Mood normal.        Behavior: Behavior normal.      Imaging: No results found.

## 2022-11-19 ENCOUNTER — Telehealth: Payer: Self-pay | Admitting: Physical Medicine and Rehabilitation

## 2022-11-19 NOTE — Telephone Encounter (Signed)
Patient advising that her arms are better but neck and shoulders are not. Please advise

## 2022-11-19 NOTE — Telephone Encounter (Signed)
Spoke with patient and she states the neck and shoulder pain are still there but she does not have the pain in her arm. She wants to know what can be done next. Please advise

## 2022-11-21 DIAGNOSIS — L728 Other follicular cysts of the skin and subcutaneous tissue: Secondary | ICD-10-CM | POA: Diagnosis not present

## 2022-11-21 DIAGNOSIS — D485 Neoplasm of uncertain behavior of skin: Secondary | ICD-10-CM | POA: Diagnosis not present

## 2022-11-26 ENCOUNTER — Telehealth: Payer: Self-pay | Admitting: Physical Medicine and Rehabilitation

## 2022-11-26 NOTE — Telephone Encounter (Signed)
Spoke with patient and scheduled OV for 11/27/22.

## 2022-11-26 NOTE — Telephone Encounter (Signed)
LVM to return call to schedule OV 

## 2022-11-26 NOTE — Telephone Encounter (Signed)
Patient returned call asked for a call back to schedule an appointment. The number to contact patient is [phone]

## 2022-11-27 ENCOUNTER — Ambulatory Visit (INDEPENDENT_AMBULATORY_CARE_PROVIDER_SITE_OTHER): Payer: Medicare Other | Admitting: Physical Medicine and Rehabilitation

## 2022-11-27 ENCOUNTER — Encounter: Payer: Self-pay | Admitting: Physical Medicine and Rehabilitation

## 2022-11-27 DIAGNOSIS — G894 Chronic pain syndrome: Secondary | ICD-10-CM | POA: Diagnosis not present

## 2022-11-27 DIAGNOSIS — M47812 Spondylosis without myelopathy or radiculopathy, cervical region: Secondary | ICD-10-CM | POA: Diagnosis not present

## 2022-11-27 DIAGNOSIS — M7918 Myalgia, other site: Secondary | ICD-10-CM

## 2022-11-27 DIAGNOSIS — M542 Cervicalgia: Secondary | ICD-10-CM
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
## 2022-11-27 NOTE — Progress Notes (Signed)
Functional Pain Scale - descriptive words and definitions  Distracting (5)    Aware of pain/able to complete some ADL's but limited by pain/sleep is affected and active distractions are only slightly useful. Moderate range order  Average Pain 7-9   Neck pain. Takes Tylenol and Advil for pain

## 2022-12-09 DIAGNOSIS — M47812 Spondylosis without myelopathy or radiculopathy, cervical region: Secondary | ICD-10-CM | POA: Diagnosis not present

## 2022-12-24 DIAGNOSIS — M47812 Spondylosis without myelopathy or radiculopathy, cervical region: Secondary | ICD-10-CM | POA: Diagnosis not present

## 2023-01-03 ENCOUNTER — Ambulatory Visit: Payer: Medicare Other | Admitting: Family Medicine

## 2023-01-08 ENCOUNTER — Ambulatory Visit: Payer: Medicare Other | Admitting: Physical Medicine and Rehabilitation

## 2023-01-09 DIAGNOSIS — M47812 Spondylosis without myelopathy or radiculopathy, cervical region: Secondary | ICD-10-CM | POA: Diagnosis not present

## 2023-01-14 DIAGNOSIS — H43813 Vitreous degeneration, bilateral: Secondary | ICD-10-CM | POA: Diagnosis not present

## 2023-01-14 DIAGNOSIS — H04123 Dry eye syndrome of bilateral lacrimal glands: Secondary | ICD-10-CM | POA: Diagnosis not present

## 2023-01-14 DIAGNOSIS — Z961 Presence of intraocular lens: Secondary | ICD-10-CM | POA: Diagnosis not present

## 2023-01-28 ENCOUNTER — Other Ambulatory Visit: Payer: Self-pay | Admitting: Physical Medicine and Rehabilitation

## 2023-01-28 ENCOUNTER — Telehealth: Payer: Self-pay

## 2023-01-28 DIAGNOSIS — M5416 Radiculopathy, lumbar region: Secondary | ICD-10-CM

## 2023-01-28 NOTE — Telephone Encounter (Signed)
Spoke with patient and she is requesting an injection. She states she is having the same pain as before and her hip is starting to "catch". Her last lumbar ESI was 05/2022 and it stopped working recently and she had 100% relief. No new falls, accidents or injuries. Please advise.

## 2023-01-30 DIAGNOSIS — M47812 Spondylosis without myelopathy or radiculopathy, cervical region: Secondary | ICD-10-CM | POA: Diagnosis not present

## 2023-01-31 ENCOUNTER — Telehealth: Payer: Self-pay | Admitting: *Deleted

## 2023-01-31 NOTE — Telephone Encounter (Signed)
Patient with diagnosis of Afib on Eliquis for anticoagulation.    Procedure: LEFT L4-L5 INTER LAMINAL EPIDURAL STEROID INJECTION  Date of procedure: TBD   CHA2DS2-VASc Score = 6   This indicates a 9.7% annual risk of stroke. The patient's score is based upon: CHF History: 1 HTN History: 1 Diabetes History: 0 Stroke History: 0 Vascular Disease History: 1 Age Score: 2 Gender Score: 1     CrCl 33 mL/min Platelet count 398    Per office protocol, patient can hold Eliquis for 3 days prior to procedure.     **This guidance is not considered finalized until pre-operative APP has relayed final recommendations.**

## 2023-01-31 NOTE — Telephone Encounter (Signed)
Pharmacy please advise on holding Eliquis  prior to Left L4-L5 inter laminal Epidural steroid injection  scheduled for TBD .Hx PAF.  Thank you.

## 2023-01-31 NOTE — Telephone Encounter (Signed)
   Pre-operative Risk Assessment    Patient Name: Lydia Graham  DOB: [birth year] MRN: [id number]      Request for Surgical Clearance    Procedure:   LEFT L4-L5 INTER LAMINAL EPIDURAL STEROID INJECTION  Date of Surgery:  Clearance TBD                                 Surgeon:  DR. FRED NEWTON Surgeon's Group or Practice Name:  [facility] Phone number:  [phone] Fax number:  [phone]   Type of Clearance Requested:   - Medical  - Pharmacy:  Hold Apixaban (Eliquis) x 2 DAYS PRIOR   Type of Anesthesia:  Not Indicated   Additional requests/questions:    Elpidio Anis   01/31/2023, 2:15 PM

## 2023-02-03 NOTE — Telephone Encounter (Signed)
   Name: Lydia Graham  DOB: 1935-12-01  MRN: [id number]  Primary Cardiologist: [name], MD  Chart reviewed as part of pre-operative protocol coverage. Because of [name]'s past medical history and time since last visit, she will require a follow-up telephone visit in order to better assess preoperative cardiovascular risk.  Pre-op covering staff: - Please schedule appointment and call patient to inform them. If patient already had an upcoming appointment within acceptable timeframe, please add "pre-op clearance" to the appointment notes so provider is aware. - Please contact requesting surgeon's office via preferred method (i.e, phone, fax) to inform them of need for appointment prior to surgery.  Per office protocol, patient can hold Eliquis for 3 days prior to procedure.      Sharlene Dory, PA-C  02/03/2023, 7:47 AM

## 2023-02-04 ENCOUNTER — Other Ambulatory Visit (INDEPENDENT_AMBULATORY_CARE_PROVIDER_SITE_OTHER): Payer: Medicare Other

## 2023-02-04 ENCOUNTER — Telehealth: Payer: Self-pay | Admitting: *Deleted

## 2023-02-04 ENCOUNTER — Ambulatory Visit (INDEPENDENT_AMBULATORY_CARE_PROVIDER_SITE_OTHER): Payer: Medicare Other | Admitting: Physical Medicine and Rehabilitation

## 2023-02-04 ENCOUNTER — Encounter: Payer: Self-pay | Admitting: Physical Medicine and Rehabilitation

## 2023-02-04 DIAGNOSIS — M25562 Pain in left knee: Secondary | ICD-10-CM

## 2023-02-04 DIAGNOSIS — M5416 Radiculopathy, lumbar region: Secondary | ICD-10-CM

## 2023-02-04 DIAGNOSIS — M542 Cervicalgia: Secondary | ICD-10-CM | POA: Diagnosis not present

## 2023-02-04 NOTE — Telephone Encounter (Signed)
Pt has been scheduled for tele pre op appt 02/13/23 @ 10:20. Med rec and consent are done. Pt tells me that Dr. Lorrine Kin is supposed to what she called an "ablation on her neck: and is not sure if this is going to interfere with the procedure for  Dr. Alvester Morin. I told her that she will need to d/w Dr. Alvester Morin further.     Patient Consent for Virtual Visit        Lydia Graham has provided verbal consent on 02/04/2023 for a virtual visit (video or telephone).   CONSENT FOR VIRTUAL VISIT FOR:  Lydia Graham  By participating in this virtual visit I agree to the following:  I hereby voluntarily request, consent and authorize [facility] and its employed or contracted physicians, physician assistants, nurse practitioners or other licensed health care professionals (the Practitioner), to provide me with telemedicine health care services (the "Services") as deemed necessary by the treating Practitioner. I acknowledge and consent to receive the Services by the Practitioner via telemedicine. I understand that the telemedicine visit will involve communicating with the Practitioner through live audiovisual communication technology and the disclosure of certain medical information by electronic transmission. I acknowledge that I have been given the opportunity to request an in-person assessment or other available alternative prior to the telemedicine visit and am voluntarily participating in the telemedicine visit.  I understand that I have the right to withhold or withdraw my consent to the use of telemedicine in the course of my care at any time, without affecting my right to future care or treatment, and that the Practitioner or I may terminate the telemedicine visit at any time. I understand that I have the right to inspect all information obtained and/or recorded in the course of the telemedicine visit and may receive copies of available information for a reasonable fee.  I understand that  some of the potential risks of receiving the Services via telemedicine include:  Delay or interruption in medical evaluation due to technological equipment failure or disruption; Information transmitted may not be sufficient (e.g. poor resolution of images) to allow for appropriate medical decision making by the Practitioner; and/or  In rare instances, security protocols could fail, causing a breach of personal health information.  Furthermore, I acknowledge that it is my responsibility to provide information about my medical history, conditions and care that is complete and accurate to the best of my ability. I acknowledge that Practitioner's advice, recommendations, and/or decision may be based on factors not within their control, such as incomplete or inaccurate data provided by me or distortions of diagnostic images or specimens that may result from electronic transmissions. I understand that the practice of medicine is not an exact science and that Practitioner makes no warranties or guarantees regarding treatment outcomes. I acknowledge that a copy of this consent can be made available to me via my patient portal [facility] MyChart), or I can request a printed copy by calling the office of [facility].    I understand that my insurance will be billed for this visit.   I have read or had this consent read to me. I understand the contents of this consent, which adequately explains the benefits and risks of the Services being provided via telemedicine.  I have been provided ample opportunity to ask questions regarding this consent and the Services and have had my questions answered to my satisfaction. I give my informed consent for the services to be provided through the  use of telemedicine in my medical care

## 2023-02-04 NOTE — Progress Notes (Unsigned)
Functional Pain Scale - descriptive words and definitions  Uncomfortable (3)  Pain is present but can complete all ADL's/sleep is slightly affected and passive distraction only gives marginal relief. Mild range order  Average Pain  varies  Pain is better in back and neck

## 2023-02-04 NOTE — Telephone Encounter (Signed)
Pt has been scheduled for tele pre op appt 02/13/23 @ 10:20. Med rec and consent are done. Pt tells me that Dr. Lorrine Kin is supposed to what she called an "ablation on her neck: and is not sure if this is going to interfere with the procedure for  Dr. Alvester Morin. I told her that she will need to d/w Dr. Alvester Morin further.

## 2023-02-04 NOTE — Progress Notes (Unsigned)
Lydia Graham - 87 y.o. female MRN [id number]  Date of birth: 1936-05-21  Office Visit Note: Visit Date: 02/04/2023 PCP: [name], FNP Referred by: [name], FNP  Subjective: Chief Complaint  Patient presents with   Lower Back - Pain   HPI: Lydia Graham is a 87 y.o. female who comes in today Patient is here today in follow up for chronic bilateral neck pain radiating to upper back and chronic left sided lower back pain radiating down to leg. We recently referred patient to Dr. Aileen Fass with [facility] for possible C1-C2 facet joint injection, however Dr. Lorrine Kin does not perform this type of injection. She recently underwent bilateral C4-C5 and C5-C6 medial branch blocks with Dr. Lorrine Kin, she reports significant relief of pain with this procedure. Per patient Dr. Lorrine Kin planning on completing cervical radiofrequency ablation procedure.   She continues to have intermittent left sided lower back pain radiating down to leg. States her pain is not severe at this time as her pain is overshadowed by recent left knee injury. She recently called into office to request lumbar injection, however she feels her back pain is not really issue at this time. We will move forward with authorization but can hold on injection at this time.  States she fell on Saturday while sweeping, landed on left knee, now reports increased pain, swelling and bruising to left knee. She would like to have her left knee evaluated. Patient is managed by Dr. Aldean Baker from orthopedic standpoint.    Review of Systems  Musculoskeletal:  Positive for joint pain and myalgias. Negative for back pain and neck pain.  Neurological:  Negative for tingling, sensory change, focal weakness and weakness.  Endo/Heme/Allergies:  Bruises/bleeds easily.  All other systems reviewed and are negative.  Otherwise per HPI.  Assessment & Plan: Visit Diagnoses:    ICD-10-CM   1. Cervicalgia  M54.2      2. Lumbar radiculopathy  M54.16     3. Acute pain of left knee  M25.562 XR Knee 1-2 Views Left    CANCELED: DG Knee 1-2 Views Left       Plan: Findings:  1. Chronic bilateral neck pain radiating to upper back and chronic left sided lower back pain radiating down to leg. No neck or lower back pain at this time. She continues with conservative therapies such as home exercise regimen, rest and use of medications. She is to follow up with Dr. Lorrine Kin for continued treatment of cervical spine issues. We are waiting on approval for lumbar injection, however we will place this procedure on hold at this time. We are happy to repeat lumbar injection infrequently as needed. No red flag symptoms noted upon exam today.   2. Acute left sided knee pain. She reports recent fall where she landed on left knee. I did place order for left knee x-ray. Swelling, bruising and tenderness noted upon exam today. My partner Barnie Del, NP does work with Dr. Lajoyce Corners and will evaluate her today.     Meds & Orders: No orders of the defined types were placed in this encounter.   Orders Placed This Encounter  Procedures   XR Knee 1-2 Views Left    Follow-up: Return if symptoms worsen or fail to improve.   Procedures: No procedures performed      Clinical History: Narrative & Impression CLINICAL DATA:  Chronic neck pain.   EXAM: MRI CERVICAL SPINE WITHOUT CONTRAST   TECHNIQUE: Multiplanar, multisequence MR imaging  of the cervical spine was performed. No intravenous contrast was administered.   COMPARISON:  Cervical spine radiographs 04/30/2022   FINDINGS: Alignment: Grade 1 anterolisthesis from C4-5 through T1-2, greatest C6-7 where it measures 4-5 mm. Mild-to-moderate left convex curvature of the lower cervical spine.   Vertebrae: No fracture or suspicious marrow lesion. Moderate marrow edema associated with left-sided C1-2 arthropathy. Small right-sided C1-2 joint effusion. Mild degenerative endplate  edema at T2-3.   Cord: Normal signal.   Posterior Fossa, vertebral arteries, paraspinal tissues: 1.5 cm T2 hyperintense nodule in the posterior right thyroid lobe. Minor T2 hyperintensities in the pons, nonspecific but most often seen with chronic small vessel ischemia. Preserved vertebral artery flow voids.   Disc levels:   C2-3: Shallow right central disc protrusion without stenosis.   C3-4: Uncovertebral spurring and mild facet arthrosis result in mild-to-moderate right neural foraminal stenosis without spinal stenosis.   C4-5: Mild right and moderate to severe left facet arthrosis without stenosis.   C5-6: Severe disc space narrowing. Anterolisthesis with bulging uncovered disc, asymmetric right uncovertebral spurring, and moderate right and mild left facet arthrosis result in moderate right neural foraminal stenosis without spinal stenosis.   C6-7: Severe disc space narrowing. Anterolisthesis with disc uncovering, uncovertebral spurring, and moderate right and mild left facet arthrosis. No significant stenosis.   C7-T1: Anterolisthesis with disc uncovering and moderate right facet arthrosis. No stenosis.   T1-2: Anterolisthesis with disc uncovering and moderate right facet arthrosis. No stenosis.   IMPRESSION: 1. Multilevel cervical disc and facet degeneration without spinal stenosis. 2. Moderate right neural foraminal stenosis at C5-6. 3. Mild-to-moderate right neural foraminal stenosis at C3-4. 4. C1-2 arthropathy with left-sided marrow edema. 5. 1.5 cm incidental right thyroid nodule. Recommend non-emergent thyroid ultrasound. Reference: J Am Coll Radiol. 2015 Feb;12(2): 143-50     Electronically Signed   By: Sebastian Ache M.D.   On: 08/27/2022 12:08   She reports that she has never smoked. She has never used smokeless tobacco.  Recent Labs    05/21/22 1009 09/06/22 0905  HGBA1C 5.3 5.6    Objective:  VS:  HT:    WT:   BMI:     BP:   HR: bpm   TEMP: ( )  RESP:  Physical Exam  Ortho Exam  Imaging: No results found.  Past Medical/Family/Surgical/Social History: Medications & Allergies reviewed per EMR, new medications updated. Patient Active Problem List   Diagnosis Date Noted   Thyroid nodule greater than or equal to 1.5 cm in diameter incidentally noted on imaging study 09/03/2022   Myofascial neck pain 09/03/2022   Lumbar radiculopathy 05/08/2022   Hyperlipidemia 05/08/2022   Cellulitis of right forearm 01/22/2022   Sepsis secondary to UTI (HCC) 12/26/2021   Acute pyelonephritis 12/25/2021   Acute metabolic encephalopathy 12/25/2021   Sepsis (HCC) 12/25/2021   AKI (acute kidney injury) (HCC) 12/25/2021   Hyponatremia 12/25/2021   Dehydration 12/25/2021   Atrial fibrillation, chronic (HCC) 12/25/2021   Essential hypertension 12/25/2021   GERD (gastroesophageal reflux disease) 12/25/2021   Acquired trigger finger of left middle finger 08/04/2020   Arthritis of hand 08/04/2020   Osteoarthritis of metacarpophalangeal (MCP) joint 08/03/2020   Bilateral hand pain 06/22/2020   Osteoarthritis of hands, 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   Osteoarthritis of feet, 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   PAF (paroxysmal atrial fibrillation) (HCC)    Chronic cough 12/29/2017   Atrial fibrillation with rapid ventricular response (HCC) 12/23/2017   Acute on chronic combined systolic and diastolic CHF (congestive heart failure) (HCC)  Menopausal symptom 10/17/2017   Menopausal syndrome 10/17/2017   Carotid stenosis 03/27/2017   S/P total knee arthroplasty, right 10/23/2016   Unilateral primary osteoarthritis, right knee 07/13/2016   Past Medical History:  Diagnosis Date   Arthritis    oa, all over - multiple areas    Carotid artery occlusion    Dysrhythmia    AFib    GERD (gastroesophageal reflux disease)    Headache    h/o migraines    Hypertension    PAF (paroxysmal atrial fibrillation) (HCC)    a. multiple DCCV's in 2019 and eventually  required initiation of Amiodarone with successful DCCV after this. b. recurrent in 12/2021 while admitted for Urosepsis   Family History  Problem Relation Age of Onset   Stroke Mother    Rheum arthritis Sister    Past Surgical History:  Procedure Laterality Date   ABDOMINAL HYSTERECTOMY     APPENDECTOMY     BACK SURGERY     CARDIOVERSION N/A 12/19/2017   Procedure: CARDIOVERSION;  Surgeon: Antoine Poche, MD;  Location: [facility];  Service: Endoscopy;  Laterality: N/A;   CARDIOVERSION N/A 12/26/2017   Procedure: CARDIOVERSION;  Surgeon: Chrystie Nose, MD;  Location: [facility] ENDOSCOPY;  Service: Cardiovascular;  Laterality: N/A;   CARDIOVERSION N/A 01/26/2018   Procedure: CARDIOVERSION;  Surgeon: Jake Bathe, MD;  Location: [facility] ENDOSCOPY;  Service: Cardiovascular;  Laterality: N/A;   CAROTID ENDARTERECTOMY     CATARACT EXTRACTION W/PHACO Right 03/09/2021   Procedure: CATARACT EXTRACTION PHACO AND INTRAOCULAR LENS PLACEMENT with Placement of Corticosteroid (IOC);  Surgeon: Fabio Pierce, MD;  Location: [facility] ORS;  Service: Ophthalmology;  Laterality: Right;  CDE 9.16   CATARACT EXTRACTION W/PHACO Left 04/02/2021   Procedure: CATARACT EXTRACTION PHACO AND INTRAOCULAR LENS PLACEMENT LEFT EYE;  Surgeon: Fabio Pierce, MD;  Location: [facility] ORS;  Service: Ophthalmology;  Laterality: Left;  left CDE=6.75   ENDARTERECTOMY Left 03/27/2017   Procedure: ENDARTERECTOMY CAROTID LEFT;  Surgeon: Maeola Harman, MD;  Location[facility] OR;  Service: Vascular;  Laterality: Left;   INNER EAR SURGERY Right    puntured eardrum- repaired 2x's    KNEE ARTHROPLASTY     NASAL SINUS SURGERY     deviated septum   PATCH ANGIOPLASTY Left 03/27/2017   Procedure: PATCH ANGIOPLASTY LEFT CAROTID ARTERY USING Livia Snellen BIOLOGIC PATCH;  Surgeon: Maeola Harman, MD;  Location[facility] OR;  Service: Vascular;  Laterality: Left;   TONSILLECTOMY     TOTAL KNEE ARTHROPLASTY Right 10/23/2016   Procedure: RIGHT TOTAL  KNEE ARTHROPLASTY;  Surgeon: Nadara Mustard, MD;  Location: [facility] OR;  Service: Orthopedics;  Laterality: Right;   Social History   Occupational History   Not on file  Tobacco Use   Smoking status: Never   Smokeless tobacco: Never  Vaping Use   Vaping status: Never Used  Substance and Sexual Activity   Alcohol use: No   Drug use: No   Sexual activity: Not Currently    Birth control/protection: Surgical

## 2023-02-05 DIAGNOSIS — M25562 Pain in left knee: Secondary | ICD-10-CM

## 2023-02-05 MED ORDER — LIDOCAINE HCL 1 % IJ SOLN
5.0000 mL | INTRAMUSCULAR | Status: AC | PRN
Start: 2023-02-05 — End: 2023-02-05
  Administered 2023-02-05: 5 mL

## 2023-02-05 MED ORDER — METHYLPREDNISOLONE ACETATE 40 MG/ML IJ SUSP
40.0000 mg | INTRAMUSCULAR | Status: AC | PRN
Start: 2023-02-05 — End: 2023-02-05
  Administered 2023-02-05: 40 mg via INTRA_ARTICULAR

## 2023-02-05 NOTE — Progress Notes (Signed)
Office Visit Note   Patient: Lydia Graham           Date of Birth: 1936/02/04           MRN: [id number] Visit Date: 02/04/2023              Requested by: [name], FNP [address] [place],  [place] [address] PCP: [name], FNP  Chief Complaint  Patient presents with   Lower Back - Pain      HPI: The patient is an 87 year old woman who presents today with a several day history of left knee pain.  She states she was walking in her kitchen using a broom as a cane and unfortunately this slipped and she fell.  She denies any twisting of the knee but did land on her knee and has had significant pain with weightbearing since.  There are abrasions to the knee she has been treating this with Silvadene.  Denies any giving out of the knee  Assessment & Plan: Visit Diagnoses:  1. Cervicalgia   2. Lumbar radiculopathy   3. Acute pain of left knee     Plan: Significant contusion of the left knee concern for possible meniscal injury as well.  Depo-Medrol injection today.  Patient tolerated well.  She will follow-up in the office in 4 weeks if she fails to improve.  Follow-Up Instructions: Return if symptoms worsen or fail to improve.   Left Knee Exam   Tenderness  The patient is experiencing tenderness in the medial joint line.  Range of Motion  The patient has normal left knee ROM.  Tests  Varus: negative Valgus: negative  Other  Swelling: moderate      Patient is alert, oriented, no adenopathy, well-dressed, normal affect, normal respiratory effort. On examination of the left knee ecchymosis present.  There is significant edema with some blistering of the skin.  There is no erythema warmth or sign of infection  Imaging: No results found. No images are attached to the encounter.  Labs: Lab Results  Component Value Date   HGBA1C 5.6 09/06/2022   HGBA1C 5.3 05/21/2022   REPTSTATUS 12/28/2021 FINAL 12/26/2021   GRAMSTAIN NO WBC SEEN RARE GRAM  POSITIVE COCCI IN PAIRS  12/26/2021   CULT  12/26/2021    RARE GROUP A STREP (S.PYOGENES) ISOLATED Beta hemolytic streptococci are predictably susceptible to penicillin and other beta lactams. Susceptibility testing not routinely performed. Performed at [facility], [address] [place], [place] [address]      Lab Results  Component Value Date   ALBUMIN 4.2 09/06/2022   ALBUMIN 4.4 05/21/2022   ALBUMIN 2.5 (L) 12/27/2021    Lab Results  Component Value Date   MG 1.9 12/31/2021   MG 1.9 12/29/2021   MG 1.8 12/27/2021   Lab Results  Component Value Date   VD25OH 42.0 09/06/2022   VD25OH 43.8 05/21/2022    No results found for: "PREALBUMIN"    Latest Ref Rng & Units 09/06/2022    9:05 AM 05/21/2022   10:09 AM 01/22/2022    9:03 AM  CBC EXTENDED  WBC 3.4 - 10.8 x10E3/uL 8.8  8.0  7.0   RBC 3.77 - 5.28 x10E6/uL 4.28  4.34  4.47   Hemoglobin 11.1 - 15.9 g/dL 82.9  56.2  13.0   HCT 34.0 - 46.6 % 38.8  41.0  41.1   Platelets 150 - 450 x10E3/uL 398  370  368   NEUT# 1.4 -  7.0 x10E3/uL 5.5  4.7    Lymph# 0.7 - 3.1 x10E3/uL 2.0  1.9       There is no height or weight on file to calculate BMI.  Orders:  Orders Placed This Encounter  Procedures   XR Knee 1-2 Views Left   No orders of the defined types were placed in this encounter.    Procedures: Large Joint Inj: L knee on 02/05/2023 4:16 PM Indications: pain Details: 18 G 1.5 in needle, anteromedial approach Medications: 5 mL lidocaine 1 %; 40 mg methylPREDNISolone acetate 40 MG/ML Consent was given by the patient.      Clinical Data: No additional findings.  ROS:  All other systems negative, except as noted in the HPI. Review of Systems  Objective: Vital Signs: LMP  (LMP Unknown)   Specialty Comments:  Narrative & Impression CLINICAL DATA:  Chronic neck pain.   EXAM: MRI CERVICAL SPINE WITHOUT CONTRAST   TECHNIQUE: Multiplanar, multisequence MR imaging of the cervical spine  was performed. No intravenous contrast was administered.   COMPARISON:  Cervical spine radiographs 04/30/2022   FINDINGS: Alignment: Grade 1 anterolisthesis from C4-5 through T1-2, greatest C6-7 where it measures 4-5 mm. Mild-to-moderate left convex curvature of the lower cervical spine.   Vertebrae: No fracture or suspicious marrow lesion. Moderate marrow edema associated with left-sided C1-2 arthropathy. Small right-sided C1-2 joint effusion. Mild degenerative endplate edema at Z6-1.   Cord: Normal signal.   Posterior Fossa, vertebral arteries, paraspinal tissues: 1.5 cm T2 hyperintense nodule in the posterior right thyroid lobe. Minor T2 hyperintensities in the pons, nonspecific but most often seen with chronic small vessel ischemia. Preserved vertebral artery flow voids.   Disc levels:   C2-3: Shallow right central disc protrusion without stenosis.   C3-4: Uncovertebral spurring and mild facet arthrosis result in mild-to-moderate right neural foraminal stenosis without spinal stenosis.   C4-5: Mild right and moderate to severe left facet arthrosis without stenosis.   C5-6: Severe disc space narrowing. Anterolisthesis with bulging uncovered disc, asymmetric right uncovertebral spurring, and moderate right and mild left facet arthrosis result in moderate right neural foraminal stenosis without spinal stenosis.   C6-7: Severe disc space narrowing. Anterolisthesis with disc uncovering, uncovertebral spurring, and moderate right and mild left facet arthrosis. No significant stenosis.   C7-T1: Anterolisthesis with disc uncovering and moderate right facet arthrosis. No stenosis.   T1-2: Anterolisthesis with disc uncovering and moderate right facet arthrosis. No stenosis.   IMPRESSION: 1. Multilevel cervical disc and facet degeneration without spinal stenosis. 2. Moderate right neural foraminal stenosis at C5-6. 3. Mild-to-moderate right neural foraminal stenosis at  C3-4. 4. C1-2 arthropathy with left-sided marrow edema. 5. 1.5 cm incidental right thyroid nodule. Recommend non-emergent thyroid ultrasound. Reference: J Am Coll Radiol. 2015 Feb;12(2): 143-50     Electronically Signed   By: Sebastian Ache M.D.   On: 08/27/2022 12:08  PMFS History: Patient Active Problem List   Diagnosis Date Noted   Thyroid nodule greater than or equal to 1.5 cm in diameter incidentally noted on imaging study 09/03/2022   Myofascial neck pain 09/03/2022   Lumbar radiculopathy 05/08/2022   Hyperlipidemia 05/08/2022   Cellulitis of right forearm 01/22/2022   Sepsis secondary to UTI (HCC) 12/26/2021   Acute pyelonephritis 12/25/2021   Acute metabolic encephalopathy 12/25/2021   Sepsis (HCC) 12/25/2021   AKI (acute kidney injury) (HCC) 12/25/2021   Hyponatremia 12/25/2021   Dehydration 12/25/2021   Atrial fibrillation, chronic (HCC) 12/25/2021   Essential hypertension 12/25/2021  GERD (gastroesophageal reflux disease) 12/25/2021   Acquired trigger finger of left middle finger 08/04/2020   Arthritis of hand 08/04/2020   Osteoarthritis of metacarpophalangeal (MCP) joint 08/03/2020   Bilateral hand pain 06/22/2020   Osteoarthritis of hands, 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   Osteoarthritis of feet, 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   PAF (paroxysmal atrial fibrillation) (HCC)    Chronic cough 12/29/2017   Atrial fibrillation with rapid ventricular response (HCC) 12/23/2017   Acute on chronic combined systolic and diastolic CHF (congestive heart failure) (HCC)    Menopausal symptom 10/17/2017   Menopausal syndrome 10/17/2017   Carotid stenosis 03/27/2017   S/P total knee arthroplasty, right 10/23/2016   Unilateral primary osteoarthritis, right knee 07/13/2016   Past Medical History:  Diagnosis Date   Arthritis    oa, all over - multiple areas    Carotid artery occlusion    Dysrhythmia    AFib    GERD (gastroesophageal reflux disease)    Headache    h/o migraines     Hypertension    PAF (paroxysmal atrial fibrillation) (HCC)    a. multiple DCCV's in 2019 and eventually required initiation of Amiodarone with successful DCCV after this. b. recurrent in 12/2021 while admitted for Urosepsis    Family History  Problem Relation Age of Onset   Stroke Mother    Rheum arthritis Sister     Past Surgical History:  Procedure Laterality Date   ABDOMINAL HYSTERECTOMY     APPENDECTOMY     BACK SURGERY     CARDIOVERSION N/A 12/19/2017   Procedure: CARDIOVERSION;  Surgeon: Antoine Poche, MD;  Location: [facility];  Service: Endoscopy;  Laterality: N/A;   CARDIOVERSION N/A 12/26/2017   Procedure: CARDIOVERSION;  Surgeon: Chrystie Nose, MD;  Location: [facility] ENDOSCOPY;  Service: Cardiovascular;  Laterality: N/A;   CARDIOVERSION N/A 01/26/2018   Procedure: CARDIOVERSION;  Surgeon: Jake Bathe, MD;  Location: [facility] ENDOSCOPY;  Service: Cardiovascular;  Laterality: N/A;   CAROTID ENDARTERECTOMY     CATARACT EXTRACTION W/PHACO Right 03/09/2021   Procedure: CATARACT EXTRACTION PHACO AND INTRAOCULAR LENS PLACEMENT with Placement of Corticosteroid (IOC);  Surgeon: Fabio Pierce, MD;  Location: [facility] ORS;  Service: Ophthalmology;  Laterality: Right;  CDE 9.16   CATARACT EXTRACTION W/PHACO Left 04/02/2021   Procedure: CATARACT EXTRACTION PHACO AND INTRAOCULAR LENS PLACEMENT LEFT EYE;  Surgeon: Fabio Pierce, MD;  Location: [facility] ORS;  Service: Ophthalmology;  Laterality: Left;  left CDE=6.75   ENDARTERECTOMY Left 03/27/2017   Procedure: ENDARTERECTOMY CAROTID LEFT;  Surgeon: Maeola Harman, MD;  Location: [facility] OR;  Service: Vascular;  Laterality: Left;   INNER EAR SURGERY Right    puntured eardrum- repaired 2x's    KNEE ARTHROPLASTY     NASAL SINUS SURGERY     deviated septum   PATCH ANGIOPLASTY Left 03/27/2017   Procedure: PATCH ANGIOPLASTY LEFT CAROTID ARTERY USING Livia Snellen BIOLOGIC PATCH;  Surgeon: Maeola Harman, MD;  Location: [facility] OR;  Service: Vascular;   Laterality: Left;   TONSILLECTOMY     TOTAL KNEE ARTHROPLASTY Right 10/23/2016   Procedure: RIGHT TOTAL KNEE ARTHROPLASTY;  Surgeon: Nadara Mustard, MD;  Location: [facility] OR;  Service: Orthopedics;  Laterality: Right;   Social History   Occupational History   Not on file  Tobacco Use   Smoking status: Never   Smokeless tobacco: Never  Vaping Use   Vaping status: Never Used  Substance and Sexual Activity   Alcohol use: No   Drug use: No   Sexual activity: Not Currently  Birth control/protection: Surgical

## 2023-02-07 ENCOUNTER — Telehealth: Payer: Self-pay | Admitting: Family Medicine

## 2023-02-07 ENCOUNTER — Telehealth: Payer: Self-pay | Admitting: Family

## 2023-02-07 NOTE — Telephone Encounter (Signed)
Patient called having knee pain from her fall asking for pain medication appointment scheduled Monday,8/19  Pharmacy: [facility]

## 2023-02-07 NOTE — Telephone Encounter (Signed)
The pt has not been in the office in over a year. We are happy to see her in the office can you please call and sch next available with either Erin or Dr. Lajoyce Corners please?

## 2023-02-07 NOTE — Telephone Encounter (Signed)
Let her know Lydia Graham is out of the office wont get to message until Monday, advised to keep appt

## 2023-02-07 NOTE — Telephone Encounter (Signed)
Patient called asked if she can get something called into [facility] in [place] The number to contact patient is [phone]

## 2023-02-07 NOTE — Telephone Encounter (Signed)
Noted  

## 2023-02-08 ENCOUNTER — Other Ambulatory Visit: Payer: Self-pay

## 2023-02-08 ENCOUNTER — Ambulatory Visit
Admission: EM | Admit: 2023-02-08 | Discharge: 2023-02-08 | Disposition: A | Discharge status: Home / Self Care | Payer: Medicare Other | Attending: Nurse Practitioner | Admitting: Nurse Practitioner

## 2023-02-08 ENCOUNTER — Encounter (HOSPITAL_COMMUNITY): Payer: Medicare Other

## 2023-02-08 ENCOUNTER — Emergency Department (HOSPITAL_COMMUNITY): Payer: Medicare Other

## 2023-02-08 ENCOUNTER — Encounter (HOSPITAL_COMMUNITY): Payer: Self-pay

## 2023-02-08 ENCOUNTER — Inpatient Hospital Stay (HOSPITAL_COMMUNITY)
Admission: EM | Admit: 2023-02-08 | Discharge: 2023-02-12 | DRG: 558 | Disposition: A | Discharge status: Home / Self Care | Payer: Medicare Other | Attending: Internal Medicine | Admitting: Internal Medicine

## 2023-02-08 ENCOUNTER — Ambulatory Visit: Payer: Medicare Other

## 2023-02-08 DIAGNOSIS — L03116 Cellulitis of left lower limb: Secondary | ICD-10-CM | POA: Diagnosis not present

## 2023-02-08 DIAGNOSIS — M25462 Effusion, left knee: Secondary | ICD-10-CM | POA: Diagnosis not present

## 2023-02-08 DIAGNOSIS — I480 Paroxysmal atrial fibrillation: Secondary | ICD-10-CM | POA: Diagnosis present

## 2023-02-08 DIAGNOSIS — S8002XA Contusion of left knee, initial encounter: Secondary | ICD-10-CM | POA: Diagnosis present

## 2023-02-08 DIAGNOSIS — Z884 Allergy status to anesthetic agent status: Secondary | ICD-10-CM | POA: Diagnosis not present

## 2023-02-08 DIAGNOSIS — M85862 Other specified disorders of bone density and structure, left lower leg: Secondary | ICD-10-CM | POA: Diagnosis not present

## 2023-02-08 DIAGNOSIS — Z043 Encounter for examination and observation following other accident: Secondary | ICD-10-CM | POA: Diagnosis not present

## 2023-02-08 DIAGNOSIS — M25562 Pain in left knee: Secondary | ICD-10-CM

## 2023-02-08 DIAGNOSIS — M1712 Unilateral primary osteoarthritis, left knee: Secondary | ICD-10-CM | POA: Diagnosis present

## 2023-02-08 DIAGNOSIS — L0390 Cellulitis, unspecified: Secondary | ICD-10-CM

## 2023-02-08 DIAGNOSIS — E871 Hypo-osmolality and hyponatremia: Secondary | ICD-10-CM | POA: Diagnosis not present

## 2023-02-08 DIAGNOSIS — M71162 Other infective bursitis, left knee: Principal | ICD-10-CM | POA: Diagnosis present

## 2023-02-08 DIAGNOSIS — N179 Acute kidney failure, unspecified: Secondary | ICD-10-CM | POA: Diagnosis not present

## 2023-02-08 DIAGNOSIS — W010XXA Fall on same level from slipping, tripping and stumbling without subsequent striking against object, initial encounter: Secondary | ICD-10-CM | POA: Diagnosis present

## 2023-02-08 DIAGNOSIS — I471 Supraventricular tachycardia, unspecified: Secondary | ICD-10-CM | POA: Diagnosis not present

## 2023-02-08 DIAGNOSIS — I447 Left bundle-branch block, unspecified: Secondary | ICD-10-CM | POA: Diagnosis not present

## 2023-02-08 DIAGNOSIS — I4892 Unspecified atrial flutter: Secondary | ICD-10-CM | POA: Diagnosis present

## 2023-02-08 DIAGNOSIS — Z8261 Family history of arthritis: Secondary | ICD-10-CM | POA: Diagnosis not present

## 2023-02-08 DIAGNOSIS — R001 Bradycardia, unspecified: Secondary | ICD-10-CM | POA: Diagnosis present

## 2023-02-08 DIAGNOSIS — I110 Hypertensive heart disease with heart failure: Secondary | ICD-10-CM | POA: Diagnosis not present

## 2023-02-08 DIAGNOSIS — I10 Essential (primary) hypertension: Secondary | ICD-10-CM | POA: Diagnosis not present

## 2023-02-08 DIAGNOSIS — M7052 Other bursitis of knee, left knee: Secondary | ICD-10-CM

## 2023-02-08 DIAGNOSIS — Z9841 Cataract extraction status, right eye: Secondary | ICD-10-CM | POA: Diagnosis not present

## 2023-02-08 DIAGNOSIS — M7042 Prepatellar bursitis, left knee: Secondary | ICD-10-CM | POA: Diagnosis not present

## 2023-02-08 DIAGNOSIS — I455 Other specified heart block: Secondary | ICD-10-CM | POA: Diagnosis not present

## 2023-02-08 DIAGNOSIS — Z96651 Presence of right artificial knee joint: Secondary | ICD-10-CM | POA: Diagnosis not present

## 2023-02-08 DIAGNOSIS — Z90710 Acquired absence of both cervix and uterus: Secondary | ICD-10-CM

## 2023-02-08 DIAGNOSIS — I5022 Chronic systolic (congestive) heart failure: Secondary | ICD-10-CM | POA: Diagnosis not present

## 2023-02-08 DIAGNOSIS — M719 Bursopathy, unspecified: Secondary | ICD-10-CM | POA: Diagnosis present

## 2023-02-08 DIAGNOSIS — Z9842 Cataract extraction status, left eye: Secondary | ICD-10-CM | POA: Diagnosis not present

## 2023-02-08 DIAGNOSIS — Z79899 Other long term (current) drug therapy: Secondary | ICD-10-CM

## 2023-02-08 DIAGNOSIS — I484 Atypical atrial flutter: Secondary | ICD-10-CM | POA: Diagnosis not present

## 2023-02-08 DIAGNOSIS — K219 Gastro-esophageal reflux disease without esophagitis: Secondary | ICD-10-CM | POA: Diagnosis present

## 2023-02-08 DIAGNOSIS — D649 Anemia, unspecified: Secondary | ICD-10-CM | POA: Diagnosis not present

## 2023-02-08 DIAGNOSIS — Z961 Presence of intraocular lens: Secondary | ICD-10-CM | POA: Diagnosis present

## 2023-02-08 DIAGNOSIS — M25569 Pain in unspecified knee: Secondary | ICD-10-CM | POA: Diagnosis present

## 2023-02-08 DIAGNOSIS — Z7901 Long term (current) use of anticoagulants: Secondary | ICD-10-CM

## 2023-02-08 DIAGNOSIS — M009 Pyogenic arthritis, unspecified: Secondary | ICD-10-CM | POA: Diagnosis not present

## 2023-02-08 DIAGNOSIS — Z79890 Hormone replacement therapy (postmenopausal): Secondary | ICD-10-CM

## 2023-02-08 DIAGNOSIS — Z888 Allergy status to other drugs, medicaments and biological substances status: Secondary | ICD-10-CM

## 2023-02-08 LAB — CBC WITH DIFFERENTIAL/PLATELET
Abs Immature Granulocytes: 0.05 10*3/uL (ref 0.00–0.07)
Basophils Absolute: 0.1 10*3/uL (ref 0.0–0.1)
Basophils Relative: 1 %
Eosinophils Absolute: 0.1 10*3/uL (ref 0.0–0.5)
Eosinophils Relative: 1 %
HCT: 36 % (ref 36.0–46.0)
Hemoglobin: 11.6 g/dL — ABNORMAL LOW (ref 12.0–15.0)
Immature Granulocytes: 0 %
Lymphocytes Relative: 13 %
Lymphs Abs: 1.6 10*3/uL (ref 0.7–4.0)
MCH: 28.4 pg (ref 26.0–34.0)
MCHC: 32.2 g/dL (ref 30.0–36.0)
MCV: 88.2 fL (ref 80.0–100.0)
Monocytes Absolute: 1.1 10*3/uL — ABNORMAL HIGH (ref 0.1–1.0)
Monocytes Relative: 9 %
Neutro Abs: 9.7 10*3/uL — ABNORMAL HIGH (ref 1.7–7.7)
Neutrophils Relative %: 76 %
Platelets: 462 10*3/uL — ABNORMAL HIGH (ref 150–400)
RBC: 4.08 MIL/uL (ref 3.87–5.11)
RDW: 14.3 % (ref 11.5–15.5)
WBC: 12.7 10*3/uL — ABNORMAL HIGH (ref 4.0–10.5)
nRBC: 0 % (ref 0.0–0.2)

## 2023-02-08 LAB — COMPREHENSIVE METABOLIC PANEL
ALT: 12 U/L (ref 0–44)
AST: 21 U/L (ref 15–41)
Albumin: 4 g/dL (ref 3.5–5.0)
Alkaline Phosphatase: 76 U/L (ref 38–126)
Anion gap: 10 (ref 5–15)
BUN: 21 mg/dL (ref 8–23)
CO2: 23 mmol/L (ref 22–32)
Calcium: 9.2 mg/dL (ref 8.9–10.3)
Chloride: 98 mmol/L (ref 98–111)
Creatinine, Ser: 1.54 mg/dL — ABNORMAL HIGH (ref 0.44–1.00)
GFR, Estimated: 33 mL/min — ABNORMAL LOW (ref >60)
Glucose, Bld: 119 mg/dL — ABNORMAL HIGH (ref 70–99)
Potassium: 4.5 mmol/L (ref 3.5–5.1)
Sodium: 131 mmol/L — ABNORMAL LOW (ref 135–145)
Total Bilirubin: 0.7 mg/dL (ref 0.3–1.2)
Total Protein: 7.6 g/dL (ref 6.5–8.1)

## 2023-02-08 LAB — I-STAT CG4 LACTIC ACID, ED: Lactic Acid, Venous: 1.1 mmol/L (ref 0.5–1.9)

## 2023-02-08 MED ORDER — ACETAMINOPHEN 500 MG PO TABS
1000.0000 mg | ORAL_TABLET | Freq: Three times a day (TID) | ORAL | Status: DC | PRN
  Administered 2023-02-10 – 2023-02-11 (×2): 1000 mg via ORAL
  Filled 2023-02-08 (×2): qty 2

## 2023-02-08 MED ORDER — SODIUM CHLORIDE 0.9 % IV SOLN
1.0000 g | INTRAVENOUS | Status: DC
  Administered 2023-02-09 – 2023-02-11 (×3): 1 g via INTRAVENOUS
  Filled 2023-02-08 (×3): qty 10

## 2023-02-08 MED ORDER — ONDANSETRON 4 MG PO TBDP
4.0000 mg | ORAL_TABLET | Freq: Once | ORAL | Status: DC
  Filled 2023-02-08: qty 1

## 2023-02-08 MED ORDER — OXYCODONE HCL 5 MG PO TABS
5.0000 mg | ORAL_TABLET | ORAL | Status: DC | PRN
  Administered 2023-02-08: 5 mg via ORAL
  Filled 2023-02-08: qty 1

## 2023-02-08 MED ORDER — LACTATED RINGERS IV BOLUS
1000.0000 mL | Freq: Once | INTRAVENOUS | Status: AC
  Administered 2023-02-08: 1000 mL via INTRAVENOUS

## 2023-02-08 MED ORDER — OXYCODONE HCL 5 MG PO TABS
5.0000 mg | ORAL_TABLET | Freq: Four times a day (QID) | ORAL | Status: DC | PRN
  Administered 2023-02-09 – 2023-02-12 (×10): 5 mg via ORAL
  Filled 2023-02-08 (×10): qty 1

## 2023-02-08 MED ORDER — VANCOMYCIN HCL 500 MG/100ML IV SOLN
500.0000 mg | INTRAVENOUS | Status: DC
  Administered 2023-02-09 – 2023-02-11 (×3): 500 mg via INTRAVENOUS
  Filled 2023-02-08 (×4): qty 100

## 2023-02-08 MED ORDER — SODIUM CHLORIDE 0.9 % IV SOLN: INTRAVENOUS | Status: AC

## 2023-02-08 MED ORDER — NALOXONE HCL 0.4 MG/ML IJ SOLN: 0.4000 mg | INTRAMUSCULAR | Status: DC | PRN

## 2023-02-08 MED ORDER — ONDANSETRON 4 MG PO TBDP
4.0000 mg | ORAL_TABLET | Freq: Once | ORAL | Status: AC
  Administered 2023-02-08: 4 mg via ORAL
  Filled 2023-02-08: qty 1

## 2023-02-08 MED ORDER — SODIUM CHLORIDE 0.9 % IV SOLN
1.0000 g | Freq: Once | INTRAVENOUS | Status: AC
  Administered 2023-02-08: 1 g via INTRAVENOUS
  Filled 2023-02-08: qty 10

## 2023-02-08 MED ORDER — CEFTRIAXONE SODIUM 1 G IJ SOLR
1.0000 g | Freq: Once | INTRAMUSCULAR | Status: AC
  Administered 2023-02-08: 1 g via INTRAMUSCULAR

## 2023-02-08 MED ORDER — OXYCODONE-ACETAMINOPHEN 5-325 MG PO TABS
1.0000 | ORAL_TABLET | Freq: Once | ORAL | Status: AC
  Administered 2023-02-08: 1 via ORAL
  Filled 2023-02-08: qty 1

## 2023-02-08 MED ORDER — MORPHINE SULFATE (PF) 4 MG/ML IV SOLN: 4.0000 mg | INTRAVENOUS | Status: DC | PRN

## 2023-02-08 MED ORDER — VANCOMYCIN HCL IN DEXTROSE 1-5 GM/200ML-% IV SOLN
1000.0000 mg | Freq: Once | INTRAVENOUS | Status: AC
  Administered 2023-02-08: 1000 mg via INTRAVENOUS
  Filled 2023-02-08: qty 200

## 2023-02-08 NOTE — ED Provider Triage Note (Signed)
Emergency Medicine Provider Triage Evaluation Note  Lydia Graham , a 87 y.o. female  was evaluated in triage.  Pt complains of left knee pain.  Patient fell and scraped her knee about a week ago.  She was seen at an orthopedic center had negative x-rays.  She has been ambulating with severe pain rates it 10 out of 10.  She is on Eliquis and since that time his had progressively worsening redness swelling hematoma development swelling of the left lower extremity and now blisters over the knee.  She was seen earlier and sent in for further evaluation.       .  Review of Systems  Positive:  Negative:   Physical Exam  BP (!) 157/66 (BP Location: Right Arm)   Pulse 73   Temp 98.3 F (36.8 C) (Oral)   Resp 18   LMP  (LMP Unknown)   SpO2 97%  Gen:   Awake, no distress   Resp:  Normal effort  MSK:   Moves extremities without difficulty  Other:  Physical exam findings as above in the pictures.  The entire left leg is swollen.  Medical Decision Making  Medically screening exam initiated at 2:07 PM.  Appropriate orders placed.  [name] was informed that the remainder of the evaluation will be completed by another provider, this initial triage assessment does not replace that evaluation, and the importance of remaining in the ED until their evaluation is complete.     Arthor Captain, PA-C 02/08/23 1426

## 2023-02-08 NOTE — ED Provider Notes (Signed)
[place] EMERGENCY DEPARTMENT AT [facility] Provider Note   CSN: [id number] Arrival date & time: 02/08/23  1339     History Chief Complaint  Patient presents with   Knee Injury    HPI Lydia Graham is a 87 y.o. female presenting for chief complaint of knee problem.  She states that a week ago she fell onto her left knee is on Eliquis.  Was not seen at that time.  Developed a large hematoma was seen by orthopedics with Ortho care and stable for outpatient follow-up.  In the interim she developed substantial blistering skin breakdown redness and pain as well as erythema to the left knee.  Was seen at urgent care this morning told to come to the emergency room for further care and management..   Patient's recorded medical, surgical, social, medication list and allergies were reviewed in the Snapshot window as part of the initial history.   Review of Systems   Review of Systems  Constitutional:  Negative for chills and fever.  HENT:  Negative for ear pain and sore throat.   Eyes:  Negative for pain and visual disturbance.  Respiratory:  Negative for cough and shortness of breath.   Cardiovascular:  Negative for chest pain and palpitations.  Gastrointestinal:  Negative for abdominal pain and vomiting.  Genitourinary:  Negative for dysuria and hematuria.  Musculoskeletal:  Negative for arthralgias and back pain.  Skin:  Negative for color change and rash.  Neurological:  Negative for seizures and syncope.  All other systems reviewed and are negative.   Physical Exam Updated Vital Signs BP (!) 157/58   Pulse 69   Temp 97.7 F (36.5 C) (Oral)   Resp 16   Ht 5\' 7"  (1.702 m)   Wt 66 kg   LMP  (LMP Unknown)   SpO2 99%   BMI 22.79 kg/m  Physical Exam Vitals and nursing note reviewed.  Constitutional:      General: She is not in acute distress.    Appearance: She is well-developed.  HENT:     Head: Normocephalic and atraumatic.  Eyes:      Conjunctiva/sclera: Conjunctivae normal.  Cardiovascular:     Rate and Rhythm: Normal rate and regular rhythm.     Heart sounds: No murmur heard. Pulmonary:     Effort: Pulmonary effort is normal. No respiratory distress.     Breath sounds: Normal breath sounds.  Abdominal:     General: There is no distension.     Palpations: Abdomen is soft.     Tenderness: There is no abdominal tenderness. There is no right CVA tenderness or left CVA tenderness.  Musculoskeletal:        General: Deformity present. No swelling or tenderness. Normal range of motion.     Cervical back: Neck supple.  Skin:    General: Skin is warm and dry.  Neurological:     General: No focal deficit present.     Mental Status: She is alert and oriented to person, place, and time. Mental status is at baseline.     Cranial Nerves: No cranial nerve deficit.         ED Course/ Medical Decision Making/ A&P Clinical Course as of 02/08/23 2310  Sat Feb 08, 2023  1716 Waiting on orthopedic callback [CC]    Clinical Course User Index [CC] Glyn Ade, MD    Procedures Procedures   Medications Ordered in ED Medications  cefTRIAXone (ROCEPHIN) 1 g in sodium chloride 0.9 %  100 mL IVPB (has no administration in time range)  oxyCODONE (Oxy IR/ROXICODONE) immediate release tablet 5 mg (has no administration in time range)  naloxone [facility]) injection 0.4 mg (has no administration in time range)  acetaminophen (TYLENOL) tablet 1,000 mg (has no administration in time range)  0.9 %  sodium chloride infusion ( Intravenous New Bag/Given 02/08/23 2152)  vancomycin (VANCOREADY) IVPB 500 mg/100 mL (has no administration in time range)  oxyCODONE-acetaminophen (PERCOCET/ROXICET) 5-325 MG per tablet 1 tablet (1 tablet Oral Given 02/08/23 1414)  ondansetron (ZOFRAN-ODT) disintegrating tablet 4 mg (4 mg Oral Given 02/08/23 1414)  lactated ringers bolus 1,000 mL (0 mLs Intravenous Stopped 02/08/23 1853)  cefTRIAXone (ROCEPHIN)  1 g in sodium chloride 0.9 % 100 mL IVPB (0 g Intravenous Stopped 02/08/23 1932)  vancomycin (VANCOCIN) IVPB 1000 mg/200 mL premix (0 mg Intravenous Stopped 02/08/23 2044)    Medical Decision Making:    Lydia Graham is a 87 y.o. female who presented to the ED today with multiple complaints detailed above .     Additional history discussed with patient's family/caregivers.  Patient placed on continuous vitals and telemetry monitoring while in ED which was reviewed periodically.   Complete initial physical exam performed, notably the patient  was hemodynamically stable no acute distress.  See above picture for deformity.      Reviewed and confirmed nursing documentation for past medical history, family history, social history.    Initial Assessment:   Patient history of present onset physical exam findings are concerning for developing cellulitis around hematoma.  Considered septic arthritis but patient is tolerating passive range of motion of the knee so this seems less likely. This is most consistent with an acute life/limb threatening illness complicated by underlying chronic conditions.  Initial Plan:  CT left knee for further evaluation of structural/orthopedic injury Screening labs including CBC and Metabolic panel to evaluate for infectious or metabolic etiology of disease.  Urinalysis with reflex culture ordered to evaluate for UTI or relevant urologic/nephrologic pathology.  Objective evaluation as below reviewed with plan for close reassessment  Initial Study Results:   Laboratory  All laboratory results reviewed without evidence of clinically relevant pathology.   Exceptions include: Leukocytosis, elevated creatinine over baseline  Radiology  All images reviewed independently. Agree with radiology report at this time.   CT Knee Left Wo Contrast  Result Date: 02/08/2023 CLINICAL DATA:  Septic arthritis. EXAM: CT OF THE LEFT KNEE WITHOUT CONTRAST TECHNIQUE: Multidetector  CT imaging of the left knee was performed according to the standard protocol. Multiplanar CT image reconstructions were also generated. RADIATION DOSE REDUCTION: This exam was performed according to the departmental dose-optimization program which includes automated exposure control, adjustment of the mA and/or kV according to patient size and/or use of iterative reconstruction technique. COMPARISON:  Knee x-ray 02/08/2023, 02/04/2023 FINDINGS: Bones/Joint/Cartilage Generalized osteopenia. No fracture or dislocation. Normal alignment. Chondrocalcinosis of the medial and lateral femorotibial compartments as can be seen with CPPD. Moderate medial femorotibial compartment joint space narrowing. Mild lateral femorotibial compartment joint space narrowing. Mild patellofemoral compartment joint space narrowing. Small knee joint effusion. Small Baker's cyst Ligaments Ligaments are suboptimally evaluated by CT. Muscles and Tendons Atrophy of the visualized portion of the distal long head of the biceps femoris muscle and semimembranosus muscle. No intramuscular fluid collection or hematoma. Soft tissue Hyperdense fluid collection superficial to the patellar tendon in the anterior subcutaneous fat measuring 6.2 x 6.5 x 2 cm most consistent with a hematoma/hemorrhagic bursitis. No soft tissue mass.  IMPRESSION: 1. Hyperdense fluid collection superficial to the patellar tendon in the anterior subcutaneous fat measuring 6.2 x 6.5 x 2 cm most consistent with a hematoma/hemorrhagic bursitis. 2. No acute osseous injury of the left knee. 3. Tricompartmental osteoarthritis of the left knee. Electronically Signed   By: Elige Ko M.D.   On: 02/08/2023 14:37   DG Knee Complete 4 Views Left  Result Date: 02/08/2023 CLINICAL DATA:  Patient fell 1 week ago. Wound on the anterior portion of the knee. Blistering and redness. EXAM: LEFT KNEE - COMPLETE 4+ VIEW COMPARISON:  None Available. FINDINGS: Bones are diffusely demineralized. No  evidence for an acute fracture or dislocation. Loss of joint space noted lateral compartment with subchondral sclerosis. Trace spurring noted patellofemoral compartment. Small joint effusion. Subtle cortical off step noted tibial tuberosity with some lucency of the tuberosity compared to background metaphyseal mineralization. IMPRESSION: 1. No acute bony traumatic abnormality. Subtle lucency anterior tibial tuberosity with associated subtle cortical off step. There does appear to be some soft tissue swelling overlying this region. If there is clinical concern for osteomyelitis, MRI with and without contrast would be the study of choice to further evaluate. 2. Degenerative changes in the lateral and patellofemoral compartments. 3. Small joint effusion. Electronically Signed   By: Kennith Center M.D.   On: 02/08/2023 12:45     Consults:  Case discussed with orthopedics on-call for Ortho care.   Reassessment and Plan:   Given patient's findings, will admit to medicine for developing cellulitis, observation and asynchronous determination of need for incision and drainage by orthopedics.   Disposition:   Based on the above findings, I believe this patient is stable for admission.    Patient/family educated about specific findings on our evaluation and explained exact reasons for admission.  Patient/family educated about clinical situation and time was allowed to answer questions.   Admission team communicated with and agreed with need for admission. Patient admitted. Patient ready to move at this time.     Emergency Department Medication Summary:   Medications  cefTRIAXone (ROCEPHIN) 1 g in sodium chloride 0.9 % 100 mL IVPB (has no administration in time range)  oxyCODONE (Oxy IR/ROXICODONE) immediate release tablet 5 mg (has no administration in time range)  naloxone (NARCAN) injection 0.4 mg (has no administration in time range)  acetaminophen (TYLENOL) tablet 1,000 mg (has no administration in time  range)  0.9 %  sodium chloride infusion ( Intravenous New Bag/Given 02/08/23 2152)  vancomycin (VANCOREADY) IVPB 500 mg/100 mL (has no administration in time range)  oxyCODONE-acetaminophen (PERCOCET/ROXICET) 5-325 MG per tablet 1 tablet (1 tablet Oral Given 02/08/23 1414)  ondansetron (ZOFRAN-ODT) disintegrating tablet 4 mg (4 mg Oral Given 02/08/23 1414)  lactated ringers bolus 1,000 mL (0 mLs Intravenous Stopped 02/08/23 1853)  cefTRIAXone (ROCEPHIN) 1 g in sodium chloride 0.9 % 100 mL IVPB (0 g Intravenous Stopped 02/08/23 1932)  vancomycin (VANCOCIN) IVPB 1000 mg/200 mL premix (0 mg Intravenous Stopped 02/08/23 2044)         Clinical Impression:  1. Cellulitis, unspecified cellulitis site      Admit   Final Clinical Impression(s) / ED Diagnoses Final diagnoses:  Cellulitis, unspecified cellulitis site    Rx / DC Orders ED Discharge Orders     None         Glyn Ade, MD 02/08/23 2310

## 2023-02-08 NOTE — Progress Notes (Signed)
Pharmacy Antibiotic Note  Lydia Graham is a 87 y.o. female presented from urgent care with a possible infected knee s/p fall and admitted on 02/08/2023 with concern for cellulitis. Pt received vancomycin loading dose and ceftriaxone x 1 in ED. Pharmacy has been consulted for vancomycin dosing.  Plan: Vancomycin 500mg  IV q24h (eAUC 405, Scr 1.54)  F/u renal function, length of therapy and micro data Vancomycin levels as needed  Height: 5\' 7"  (170.2 cm) Weight: 66 kg (145 lb 8.1 oz) IBW/kg (Calculated) : 61.6  Temp (24hrs), Avg:97.8 F (36.6 C), Min:97.4 F (36.3 C), Max:98.3 F (36.8 C)  Recent Labs  Lab 02/08/23 1414 02/08/23 1418  WBC 12.7*  --   CREATININE 1.54*  --   LATICACIDVEN  --  1.1    Estimated Creatinine Clearance: 26 mL/min (A) (by C-G formula based on SCr of 1.54 mg/dL (H)).    Allergies  Allergen Reactions   Rosuvastatin Other (See Comments)    MYALGIA, Muscle pain   Lisinopril Cough   Other Other (See Comments)    PAIN MEDICATIONS/ANESTHESIA  MADE BP DROP LOW    Singulair [Montelukast Sodium] Other (See Comments)    fatigue    Antimicrobials this admission: Ceftriaxone 8/17 > Vancomycin 8/17 >  Dose adjustments this admission:   Microbiology results: 8/17 Bcx: sent   Thank you for allowing pharmacy to be a part of this patient's care.  Marja Kays 02/08/2023 8:17 PM

## 2023-02-08 NOTE — Discharge Instructions (Signed)
Go to the emergency department for further evaluation

## 2023-02-08 NOTE — H&P (Signed)
History and Physical    [name] ONG:[id number] DOB: [birth year] DOA: 02/08/2023  PCP: [name], FNP  Patient coming from: Home  Chief Complaint: Left knee pain and swelling  HPI: Lydia Graham is a 87 y.o. female with medical history significant of paroxysmal A-fib on Eliquis, hypertension, GERD, carotid stenosis status post left carotid endarterectomy in 2018 presenting with complaint of left knee pain, swelling, and bruising since after a fall a week ago.  Patient states she was seen by orthopedics and had an x-ray done which did not show a fracture but she was told there could be some bleeding in her knee as she is on Eliquis.  States she was given a steroid injection at that time.  She went to urgent care today as she has now developed blistering, redness, and warmth to the left knee.  Denies fevers.  Denies chest pain or shortness of breath.  She cut down the dose of Eliquis 2 days ago from twice daily to once daily.  Last dose of Eliquis was this morning.  Patient was seen at urgent care today and x-ray of left knee showing: "IMPRESSION: 1. No acute bony traumatic abnormality. Subtle lucency anterior tibial tuberosity with associated subtle cortical off step. There does appear to be some soft tissue swelling overlying this region. If there is clinical concern for osteomyelitis, MRI with and without contrast would be the study of choice to further evaluate. 2. Degenerative changes in the lateral and patellofemoral compartments. 3. Small joint effusion."  She was given dose of ceftriaxone due to concern for infection and was sent to the ED for further evaluation.  Afebrile in the ED.  Labs showing WBC 12.7, hemoglobin 11.6 (baseline 12-13), MCV 88.2, platelet count 462k, sodium 131, creatinine 1.5 (baseline 1.0-1.2), lactic acid normal, blood cultures collected.  CT of left knee without contrast showing: "IMPRESSION: 1. Hyperdense fluid collection superficial to  the patellar tendon in the anterior subcutaneous fat measuring 6.2 x 6.5 x 2 cm most consistent with a hematoma/hemorrhagic bursitis. 2. No acute osseous injury of the left knee. 3. Tricompartmental osteoarthritis of the left knee."  EDP spoke to Dr. Aundria Rud with orthopedics who recommended continuing antibiotics for now and their service will see the patient in the morning.  Patient was given Zofran, oxycodone, vancomycin, ceftriaxone, and 1 L LR in the ED.  Review of Systems:  Review of Systems  All other systems reviewed and are negative.   Past Medical History:  Diagnosis Date   Arthritis    oa, all over - multiple areas    Carotid artery occlusion    Dysrhythmia    AFib    GERD (gastroesophageal reflux disease)    Headache    h/o migraines    Hypertension    PAF (paroxysmal atrial fibrillation) (HCC)    a. multiple DCCV's in 2019 and eventually required initiation of Amiodarone with successful DCCV after this. b. recurrent in 12/2021 while admitted for Urosepsis    Past Surgical History:  Procedure Laterality Date   ABDOMINAL HYSTERECTOMY     APPENDECTOMY     BACK SURGERY     CARDIOVERSION N/A 12/19/2017   Procedure: CARDIOVERSION;  Surgeon: Antoine Poche, MD;  Location: [facility] ENDO SUITE;  Service: Endoscopy;  Laterality: N/A;   CARDIOVERSION N/A 12/26/2017   Procedure: CARDIOVERSION;  Surgeon: Chrystie Nose, MD;  Location: [facility] ENDOSCOPY;  Service: Cardiovascular;  Laterality: N/A;   CARDIOVERSION N/A 01/26/2018   Procedure: CARDIOVERSION;  Surgeon: Donato Schultz  C, MD;  Location: [facility] ENDOSCOPY;  Service: Cardiovascular;  Laterality: N/A;   CAROTID ENDARTERECTOMY     CATARACT EXTRACTION W/PHACO Right 03/09/2021   Procedure: CATARACT EXTRACTION PHACO AND INTRAOCULAR LENS PLACEMENT with Placement of Corticosteroid (IOC);  Surgeon: Fabio Pierce, MD;  Location: [facility] ORS;  Service: Ophthalmology;  Laterality: Right;  CDE 9.16   CATARACT EXTRACTION W/PHACO Left 04/02/2021    Procedure: CATARACT EXTRACTION PHACO AND INTRAOCULAR LENS PLACEMENT LEFT EYE;  Surgeon: Fabio Pierce, MD;  Location: [facility] ORS;  Service: Ophthalmology;  Laterality: Left;  left CDE=6.75   ENDARTERECTOMY Left 03/27/2017   Procedure: ENDARTERECTOMY CAROTID LEFT;  Surgeon: Maeola Harman, MD;  Location: [facility] OR;  Service: Vascular;  Laterality: Left;   INNER EAR SURGERY Right    puntured eardrum- repaired 2x's    KNEE ARTHROPLASTY     NASAL SINUS SURGERY     deviated septum   PATCH ANGIOPLASTY Left 03/27/2017   Procedure: PATCH ANGIOPLASTY LEFT CAROTID ARTERY USING Livia Snellen BIOLOGIC PATCH;  Surgeon: Maeola Harman, MD;  Location: [facility] OR;  Service: Vascular;  Laterality: Left;   TONSILLECTOMY     TOTAL KNEE ARTHROPLASTY Right 10/23/2016   Procedure: RIGHT TOTAL KNEE ARTHROPLASTY;  Surgeon: Nadara Mustard, MD;  Location: [facility] OR;  Service: Orthopedics;  Laterality: Right;     reports that she has never smoked. She has never used smokeless tobacco. She reports that she does not drink alcohol and does not use drugs.  Allergies  Allergen Reactions   Rosuvastatin Other (See Comments)    MYALGIA, Muscle pain   Lisinopril Cough   Other Other (See Comments)    PAIN MEDICATIONS/ANESTHESIA  MADE BP DROP LOW    Singulair [Montelukast Sodium] Other (See Comments)    fatigue    Family History  Problem Relation Age of Onset   Stroke Mother    Rheum arthritis Sister     Prior to Admission medications   Medication Sig Start Date End Date Taking? Authorizing Provider  amiodarone (PACERONE) 200 MG tablet Take 0.5 tablets (100 mg total) by mouth every other day. 10/04/22   Mealor, Roberts Gaudy, MD  amLODipine (NORVASC) 5 MG tablet TAKE 1 TABLET AT 8AM AND 1 TABLET AT 8PM. 07/26/22   Antoine Poche, MD  apixaban (ELIQUIS) 5 MG TABS tablet Take 1 tablet (5 mg total) by mouth 2 (two) times daily. 09/10/22   Mealor, Roberts Gaudy, MD  Calcium Carbonate-Vitamin D (CALCIUM 600+D PO) Take 1 tablet  by mouth daily.    [provider]  Camphor-Menthol-Methyl Sal (SALONPAS) 3.06-29-08 % PTCH Place 1 patch onto the skin at bedtime as needed (foot pain).    [provider]  estradiol (ESTRACE) 0.5 MG tablet Take 0.5 mg by mouth daily.  04/18/16   [provider]  furosemide (LASIX) 20 MG tablet Take 1 tablet (20 mg total) by mouth as needed for fluid or edema. 05/03/22   Jodelle Gross, NP  Hypromellose (ARTIFICIAL TEARS OP) Place 2 drops into both eyes 2 (two) times daily as needed (for dry eyes).    [provider]  losartan (COZAAR) 50 MG tablet take 1 tablet at 10 IN THE MORNING and 1 tablet at 10 pm. 11/06/22   Antoine Poche, MD  magnesium hydroxide (MILK OF MAGNESIA) 400 MG/5ML suspension Take 30 mLs by mouth daily as needed for mild constipation.    [provider]  metoprolol succinate (TOPROL-XL) 50 MG 24 hr tablet take 1 tablet at lunch or  immediately following a meal. 11/13/22   Antoine Poche, MD  pantoprazole (PROTONIX) 40 MG tablet TAKE (1) TABLET TWICE DAILY. 07/26/22   Antoine Poche, MD  vitamin B-12 (CYANOCOBALAMIN) 1000 MCG tablet Take 1,000 mcg by mouth daily.    [provider]    Physical Exam: Vitals:   02/08/23 1403 02/08/23 1731  BP: (!) 157/66 (!) 157/58  Pulse: 73 69  Resp: 18 16  Temp: 98.3 F (36.8 C) 97.7 F (36.5 C)  TempSrc: Oral Oral  SpO2: 97% 99%  Weight:  66 kg  Height:  5\' 7"  (1.702 m)    Physical Exam Vitals reviewed.  Constitutional:      General: She is not in acute distress. HENT:     Head: Normocephalic and atraumatic.  Eyes:     Extraocular Movements: Extraocular movements intact.  Cardiovascular:     Rate and Rhythm: Normal rate and regular rhythm.     Pulses: Normal pulses.  Pulmonary:     Effort: Pulmonary effort is normal. No respiratory distress.     Breath sounds: Normal breath sounds. No wheezing or rales.  Abdominal:     General: Bowel sounds are normal.  There is no distension.     Palpations: Abdomen is soft.     Tenderness: There is no abdominal tenderness.  Musculoskeletal:     Cervical back: Normal range of motion.     Comments: Left knee swollen with bruising, erythema, warm to touch, and blistering..  Range of motion very limited due to pain.  See images.  Skin:    General: Skin is warm and dry.  Neurological:     General: No focal deficit present.     Mental Status: She is alert and oriented to person, place, and time.         Labs on Admission: I have personally reviewed following labs and imaging studies  CBC: Recent Labs  Lab 02/08/23 1414  WBC 12.7*  NEUTROABS 9.7*  HGB 11.6*  HCT 36.0  MCV 88.2  PLT 462*   Basic Metabolic Panel: Recent Labs  Lab 02/08/23 1414  NA 131*  K 4.5  CL 98  CO2 23  GLUCOSE 119*  BUN 21  CREATININE 1.54*  CALCIUM 9.2   GFR: Estimated Creatinine Clearance: 26 mL/min (A) (by C-G formula based on SCr of 1.54 mg/dL (H)). Liver Function Tests: Recent Labs  Lab 02/08/23 1414  AST 21  ALT 12  ALKPHOS 76  BILITOT 0.7  PROT 7.6  ALBUMIN 4.0   No results for input(s): "LIPASE", "AMYLASE" in the last 168 hours. No results for input(s): "AMMONIA" in the last 168 hours. Coagulation Profile: No results for input(s): "INR", "PROTIME" in the last 168 hours. Cardiac Enzymes: No results for input(s): "CKTOTAL", "CKMB", "CKMBINDEX", "TROPONINI" in the last 168 hours. BNP (last 3 results) No results for input(s): "PROBNP" in the last 8760 hours. HbA1C: No results for input(s): "HGBA1C" in the last 72 hours. CBG: No results for input(s): "GLUCAP" in the last 168 hours. Lipid Profile: No results for input(s): "CHOL", "HDL", "LDLCALC", "TRIG", "CHOLHDL", "LDLDIRECT" in the last 72 hours. Thyroid Function Tests: No results for input(s): "TSH", "T4TOTAL", "FREET4", "T3FREE", "THYROIDAB" in the last 72 hours. Anemia Panel: No results for input(s): "VITAMINB12", "FOLATE", "FERRITIN",  "TIBC", "IRON", "RETICCTPCT" in the last 72 hours. Urine analysis:    Component Value Date/Time   COLORURINE YELLOW 12/25/2021 0115   APPEARANCEUR CLEAR 12/25/2021 0115   LABSPEC 1.013 12/25/2021 0115   PHURINE  6.0 12/25/2021 0115   GLUCOSEU NEGATIVE 12/25/2021 0115   HGBUR SMALL (A) 12/25/2021 0115   BILIRUBINUR NEGATIVE 12/25/2021 0115   KETONESUR NEGATIVE 12/25/2021 0115   PROTEINUR NEGATIVE 12/25/2021 0115   NITRITE NEGATIVE 12/25/2021 0115   LEUKOCYTESUR NEGATIVE 12/25/2021 0115    Radiological Exams on Admission: CT Knee Left Wo Contrast  Result Date: 02/08/2023 CLINICAL DATA:  Septic arthritis. EXAM: CT OF THE LEFT KNEE WITHOUT CONTRAST TECHNIQUE: Multidetector CT imaging of the left knee was performed according to the standard protocol. Multiplanar CT image reconstructions were also generated. RADIATION DOSE REDUCTION: This exam was performed according to the departmental dose-optimization program which includes automated exposure control, adjustment of the mA and/or kV according to patient size and/or use of iterative reconstruction technique. COMPARISON:  Knee x-ray 02/08/2023, 02/04/2023 FINDINGS: Bones/Joint/Cartilage Generalized osteopenia. No fracture or dislocation. Normal alignment. Chondrocalcinosis of the medial and lateral femorotibial compartments as can be seen with CPPD. Moderate medial femorotibial compartment joint space narrowing. Mild lateral femorotibial compartment joint space narrowing. Mild patellofemoral compartment joint space narrowing. Small knee joint effusion. Small Baker's cyst Ligaments Ligaments are suboptimally evaluated by CT. Muscles and Tendons Atrophy of the visualized portion of the distal long head of the biceps femoris muscle and semimembranosus muscle. No intramuscular fluid collection or hematoma. Soft tissue Hyperdense fluid collection superficial to the patellar tendon in the anterior subcutaneous fat measuring 6.2 x 6.5 x 2 cm most consistent  with a hematoma/hemorrhagic bursitis. No soft tissue mass. IMPRESSION: 1. Hyperdense fluid collection superficial to the patellar tendon in the anterior subcutaneous fat measuring 6.2 x 6.5 x 2 cm most consistent with a hematoma/hemorrhagic bursitis. 2. No acute osseous injury of the left knee. 3. Tricompartmental osteoarthritis of the left knee. Electronically Signed   By: Elige Ko M.D.   On: 02/08/2023 14:37   DG Knee Complete 4 Views Left  Result Date: 02/08/2023 CLINICAL DATA:  Patient fell 1 week ago. Wound on the anterior portion of the knee. Blistering and redness. EXAM: LEFT KNEE - COMPLETE 4+ VIEW COMPARISON:  None Available. FINDINGS: Bones are diffusely demineralized. No evidence for an acute fracture or dislocation. Loss of joint space noted lateral compartment with subchondral sclerosis. Trace spurring noted patellofemoral compartment. Small joint effusion. Subtle cortical off step noted tibial tuberosity with some lucency of the tuberosity compared to background metaphyseal mineralization. IMPRESSION: 1. No acute bony traumatic abnormality. Subtle lucency anterior tibial tuberosity with associated subtle cortical off step. There does appear to be some soft tissue swelling overlying this region. If there is clinical concern for osteomyelitis, MRI with and without contrast would be the study of choice to further evaluate. 2. Degenerative changes in the lateral and patellofemoral compartments. 3. Small joint effusion. Electronically Signed   By: Kennith Center M.D.   On: 02/08/2023 12:45    Assessment and Plan  Left knee hematoma/hemorrhagic bursitis Concern for infection Orthopedics consulted, will keep n.p.o. after midnight except sips with meds. WBC count 12.7, no fever or signs of sepsis.  Lactic acid normal.  Continue vancomycin and ceftriaxone.  Hold Eliquis.  Blood cultures pending.  Continue to monitor WBC count.  Continue pain management.  Mild anemia In the setting of left knee  hematoma/hemorrhagic bursitis.  Hemoglobin 11.6, baseline 12-13.  Hold Eliquis and continue to monitor H&H.  Mild hyponatremia Sodium 131.  Gentle IV fluid hydration with normal saline and monitor sodium level.  Mild AKI Creatinine 1.5, baseline 1.0-1.2.  Gentle IV fluid hydration and continue to monitor  renal function.  Avoid nephrotoxic agents.  Paroxysmal A-fib Hold Eliquis at this time.  Continue metoprolol after pharmacy med rec is done.  Hypertension Systolic currently in 150s.  Continue home medications after pharmacy med rec is done.  DVT prophylaxis: SCDs Code Status: Full Code (discussed with the patient) Family Communication: Daughter at bedside. Consults called: Orthopedics Level of care: Telemetry bed Admission status: It is my clinical opinion that referral for OBSERVATION is reasonable and necessary in this patient based on the above information provided. The aforementioned taken together are felt to place the patient at high risk for further clinical deterioration. However, it is anticipated that the patient may be medically stable for discharge from the hospital within 24 to 48 hours.   John Giovanni MD [facility]  If 7PM-7AM, please contact night-coverage [email or web address]  02/08/2023, 7:29 PM

## 2023-02-08 NOTE — ED Notes (Signed)
Attending at bedside.

## 2023-02-08 NOTE — ED Provider Notes (Signed)
[facility]    CSN: [id number] Arrival date & time: 02/08/23  1137      History   Chief Complaint No chief complaint on file.   HPI Lydia Graham is a 87 y.o. female.   The history is provided by the patient and a relative (daughter).   The patient presents with her daughter for left knee pain.  Patient states approximately 1 week ago, she fell onto the left knee in her home.  She developed bruising after the fall as she currently takes blood thinners.  She states she was seen by Ortho care and was determined that she did not have a fracture.  Over the past 24 hours, she is developed blistering, redness, and warmth to the left knee.  She denies fever, chills, chest pain, abdominal pain, nausea, vomiting, diarrhea.  Patient reports she has been taking Tylenol with minimal relief.  Past Medical History:  Diagnosis Date   Arthritis    oa, all over - multiple areas    Carotid artery occlusion    Dysrhythmia    AFib    GERD (gastroesophageal reflux disease)    Headache    h/o migraines    Hypertension    PAF (paroxysmal atrial fibrillation) (HCC)    a. multiple DCCV's in 2019 and eventually required initiation of Amiodarone with successful DCCV after this. b. recurrent in 12/2021 while admitted for Urosepsis    Patient Active Problem List   Diagnosis Date Noted   Thyroid nodule greater than or equal to 1.5 cm in diameter incidentally noted on imaging study 09/03/2022   Myofascial neck pain 09/03/2022   Lumbar radiculopathy 05/08/2022   Hyperlipidemia 05/08/2022   Cellulitis of right forearm 01/22/2022   Sepsis secondary to UTI (HCC) 12/26/2021   Acute pyelonephritis 12/25/2021   Acute metabolic encephalopathy 12/25/2021   Sepsis (HCC) 12/25/2021   AKI (acute kidney injury) (HCC) 12/25/2021   Hyponatremia 12/25/2021   Dehydration 12/25/2021   Atrial fibrillation, chronic (HCC) 12/25/2021   Essential hypertension 12/25/2021   GERD (gastroesophageal reflux  disease) 12/25/2021   Acquired trigger finger of left middle finger 08/04/2020   Arthritis of hand 08/04/2020   Osteoarthritis of metacarpophalangeal (MCP) joint 08/03/2020   Bilateral hand pain 06/22/2020   Osteoarthritis of hands, 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   Osteoarthritis of feet, 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   PAF (paroxysmal atrial fibrillation) (HCC)    Chronic cough 12/29/2017   Atrial fibrillation with rapid ventricular response (HCC) 12/23/2017   Acute on chronic combined systolic and diastolic CHF (congestive heart failure) (HCC)    Menopausal symptom 10/17/2017   Menopausal syndrome 10/17/2017   Carotid stenosis 03/27/2017   S/P total knee arthroplasty, right 10/23/2016   Unilateral primary osteoarthritis, right knee 07/13/2016    Past Surgical History:  Procedure Laterality Date   ABDOMINAL HYSTERECTOMY     APPENDECTOMY     BACK SURGERY     CARDIOVERSION N/A 12/19/2017   Procedure: CARDIOVERSION;  Surgeon: Antoine Poche, MD;  Location: [facility];  Service: Endoscopy;  Laterality: N/A;   CARDIOVERSION N/A 12/26/2017   Procedure: CARDIOVERSION;  Surgeon: Chrystie Nose, MD;  Location: [facility] ENDOSCOPY;  Service: Cardiovascular;  Laterality: N/A;   CARDIOVERSION N/A 01/26/2018   Procedure: CARDIOVERSION;  Surgeon: Jake Bathe, MD;  Location: [facility] ENDOSCOPY;  Service: Cardiovascular;  Laterality: N/A;   CAROTID ENDARTERECTOMY     CATARACT EXTRACTION W/PHACO Right 03/09/2021   Procedure: CATARACT EXTRACTION PHACO AND INTRAOCULAR LENS PLACEMENT with Placement of Corticosteroid (IOC);  Surgeon: Fabio Pierce, MD;  Location: [facility] ORS;  Service: Ophthalmology;  Laterality: Right;  CDE 9.16   CATARACT EXTRACTION W/PHACO Left 04/02/2021   Procedure: CATARACT EXTRACTION PHACO AND INTRAOCULAR LENS PLACEMENT LEFT EYE;  Surgeon: Fabio Pierce, MD;  Location: [facility];  Service: Ophthalmology;  Laterality: Left;  left CDE=6.75   ENDARTERECTOMY Left 03/27/2017   Procedure: ENDARTERECTOMY CAROTID  LEFT;  Surgeon: Maeola Harman, MD;  Location: [facility] OR;  Service: Vascular;  Laterality: Left;   INNER EAR SURGERY Right    puntured eardrum- repaired 2x's    KNEE ARTHROPLASTY     NASAL SINUS SURGERY     deviated septum   PATCH ANGIOPLASTY Left 03/27/2017   Procedure: PATCH ANGIOPLASTY LEFT CAROTID ARTERY USING Livia Snellen BIOLOGIC PATCH;  Surgeon: Maeola Harman, MD;  Location: [facility] OR;  Service: Vascular;  Laterality: Left;   TONSILLECTOMY     TOTAL KNEE ARTHROPLASTY Right 10/23/2016   Procedure: RIGHT TOTAL KNEE ARTHROPLASTY;  Surgeon: Nadara Mustard, MD;  Location: [facility] OR;  Service: Orthopedics;  Laterality: Right;    OB History   No obstetric history on file.      Home Medications    Prior to Admission medications   Medication Sig Start Date End Date Taking? Authorizing Provider  amiodarone (PACERONE) 200 MG tablet Take 0.5 tablets (100 mg total) by mouth every other day. 10/04/22   Mealor, Roberts Gaudy, MD  amLODipine (NORVASC) 5 MG tablet TAKE 1 TABLET AT 8AM AND 1 TABLET AT 8PM. 07/26/22   Antoine Poche, MD  apixaban (ELIQUIS) 5 MG TABS tablet Take 1 tablet (5 mg total) by mouth 2 (two) times daily. 09/10/22   Mealor, Roberts Gaudy, MD  Calcium Carbonate-Vitamin D (CALCIUM 600+D PO) Take 1 tablet by mouth daily.    [provider]  Camphor-Menthol-Methyl Sal (SALONPAS) 3.06-29-08 % PTCH Place 1 patch onto the skin at bedtime as needed (foot pain).    [provider]  estradiol (ESTRACE) 0.5 MG tablet Take 0.5 mg by mouth daily.  04/18/16   [provider]  furosemide (LASIX) 20 MG tablet Take 1 tablet (20 mg total) by mouth as needed for fluid or edema. 05/03/22   Jodelle Gross, NP  Hypromellose (ARTIFICIAL TEARS OP) Place 2 drops into both eyes 2 (two) times daily as needed (for dry eyes).    [provider]  losartan (COZAAR) 50 MG tablet take 1 tablet at 10 IN THE MORNING and 1 tablet at 10 pm. 11/06/22   Antoine Poche, MD   magnesium hydroxide (MILK OF MAGNESIA) 400 MG/5ML suspension Take 30 mLs by mouth daily as needed for mild constipation.    [provider]  metoprolol succinate (TOPROL-XL) 50 MG 24 hr tablet take 1 tablet at lunch or immediately following a meal. 11/13/22   Antoine Poche, MD  pantoprazole (PROTONIX) 40 MG tablet TAKE (1) TABLET TWICE DAILY. 07/26/22   Antoine Poche, MD  vitamin B-12 (CYANOCOBALAMIN) 1000 MCG tablet Take 1,000 mcg by mouth daily.    [provider]    Family History Family History  Problem Relation Age of Onset   Stroke Mother    Rheum arthritis Sister     Social History Social History   Tobacco Use   Smoking status: Never   Smokeless tobacco: Never  Vaping Use   Vaping status: Never Used  Substance Use Topics   Alcohol use: No   Drug use: No     Allergies  Rosuvastatin, Lisinopril, Other, and Singulair [montelukast sodium]   Review of Systems Review of Systems Per HPI  Physical Exam Triage Vital Signs ED Triage Vitals  Encounter Vitals Group     BP 02/08/23 1146 132/72     Systolic BP Percentile --      Diastolic BP Percentile --      Pulse Rate 02/08/23 1146 80     Resp 02/08/23 1146 16     Temp 02/08/23 1146 (!) 97.4 F (36.3 C)     Temp Source 02/08/23 1146 Oral     SpO2 02/08/23 1146 94 %     Weight 02/08/23 1147 145 lb (65.8 kg)     Height 02/08/23 1147 5\' 7"  (1.702 m)     Head Circumference --      Peak Flow --      Pain Score 02/08/23 1146 10     Pain Loc --      Pain Education --      Exclude from Growth Chart --    No data found.  Updated Vital Signs BP 132/72 (BP Location: Left Arm)   Pulse 80   Temp (!) 97.4 F (36.3 C) (Oral)   Resp 16   Ht 5\' 7"  (1.702 m)   Wt 145 lb (65.8 kg)   LMP  (LMP Unknown)   SpO2 94%   BMI 22.71 kg/m   Visual Acuity Right Eye Distance:   Left Eye Distance:   Bilateral Distance:    Right Eye Near:   Left Eye Near:    Bilateral Near:     Physical  Exam Vitals and nursing note reviewed.  Constitutional:      General: She is not in acute distress.    Appearance: Normal appearance.  HENT:     Head: Normocephalic.  Eyes:     Extraocular Movements: Extraocular movements intact.     Pupils: Pupils are equal, round, and reactive to light.  Cardiovascular:     Rate and Rhythm: Normal rate and regular rhythm.     Pulses: Normal pulses.     Heart sounds: Normal heart sounds.  Pulmonary:     Effort: Pulmonary effort is normal. No respiratory distress.     Breath sounds: Normal breath sounds. No stridor. No wheezing, rhonchi or rales.  Abdominal:     General: Bowel sounds are normal.     Palpations: Abdomen is soft.  Musculoskeletal:     Cervical back: Normal range of motion.     Left knee: Swelling and ecchymosis present. Decreased range of motion. Tenderness present. Normal pulse.     Comments: Bruising noted from the left knee down to the left ankle.  Skin:    General: Skin is warm and dry.     Findings: Ecchymosis, erythema and wound present.     Comments: Wound present to the left knee.  See attached images  Neurological:     General: No focal deficit present.     Mental Status: She is alert and oriented to person, place, and time.  Psychiatric:        Mood and Affect: Mood normal.        Behavior: Behavior normal.             UC Treatments / Results  Labs (all labs ordered are listed, but only abnormal results are displayed) Labs Reviewed - No data to display  EKG   Radiology DG Knee Complete 4 Views Left  Result Date: 02/08/2023 CLINICAL DATA:  Patient  fell 1 week ago. Wound on the anterior portion of the knee. Blistering and redness. EXAM: LEFT KNEE - COMPLETE 4+ VIEW COMPARISON:  None Available. FINDINGS: Bones are diffusely demineralized. No evidence for an acute fracture or dislocation. Loss of joint space noted lateral compartment with subchondral sclerosis. Trace spurring noted patellofemoral  compartment. Small joint effusion. Subtle cortical off step noted tibial tuberosity with some lucency of the tuberosity compared to background metaphyseal mineralization. IMPRESSION: 1. No acute bony traumatic abnormality. Subtle lucency anterior tibial tuberosity with associated subtle cortical off step. There does appear to be some soft tissue swelling overlying this region. If there is clinical concern for osteomyelitis, MRI with and without contrast would be the study of choice to further evaluate. 2. Degenerative changes in the lateral and patellofemoral compartments. 3. Small joint effusion. Electronically Signed   By: Kennith Center M.D.   On: 02/08/2023 12:45    Procedures Procedures (including critical care time)  Medications Ordered in UC Medications  cefTRIAXone (ROCEPHIN) injection 1 g (1 g Intramuscular Given 02/08/23 1238)    Initial Impression / Assessment and Plan / UC Course  I have reviewed the triage vital signs and the nursing notes.  Pertinent labs & imaging results that were available during my care of the patient were reviewed by me and considered in my medical decision making (see chart for details).  The patient is well-appearing, she is in no acute distress, vital signs are stable.  Patient presents after she fell approximately 1 week ago.  Patient was seen at Ortho care and imaging was done at that time that did not show any acute fractures or dislocations.  Over the past 24 hours, patient developed increasing left knee redness, blistering, and swelling.  Patient is alert and oriented, her vital signs are stable.  X-ray of the left knee shows soft tissue swelling over the subtle cortical off step.  X-ray could not rule out osteomyelitis, and MRI was suggested.  Given the patient's age, and current presentation, discussion with the patient's family regarding concern for osteomyelitis.  Advised patient's family that condition cannot be ruled out without further imaging.   Patient's family is in agreement with this recommendation.  Patient was given ceftriaxone 1 g in the clinic.  Patient's vital signs are stable, she is ambulatory with a cane.  Patient discharged to the emergency department for further evaluation.   Final Clinical Impressions(s) / UC Diagnoses   Final diagnoses:  Pain and swelling of left knee     Discharge Instructions      Go to the emergency department for further evaluation.     ED Prescriptions   None    PDMP not reviewed this encounter.   Abran Cantor, NP 02/08/23 1309

## 2023-02-08 NOTE — ED Triage Notes (Addendum)
Patient presents with a fall a week ago. Patient was seen Tuesday at Ortho care for left knee pain, bruising, blistering, redness, warm to touch that started overnight. Patient is treating with Tylenol and states no relief.

## 2023-02-08 NOTE — ED Triage Notes (Signed)
Pt arrived POV from urgent care after she fell a week ago. Pt had xrays and was told it was not broken but pt's left knee is bruised and has a scrap on the front with redness around it, could possibly be infected.

## 2023-02-08 NOTE — ED Notes (Signed)
Patient is being discharged from the Urgent Care and sent to the Emergency Department via private vehicle . Per NP, patient is in need of higher level of care due to possible osteomyelitis. Patient is aware and verbalizes understanding of plan of care.  Vitals:   02/08/23 1146  BP: 132/72  Pulse: 80  Resp: 16  Temp: (!) 97.4 F (36.3 C)  SpO2: 94%

## 2023-02-09 ENCOUNTER — Other Ambulatory Visit: Payer: Self-pay

## 2023-02-09 DIAGNOSIS — Z79899 Other long term (current) drug therapy: Secondary | ICD-10-CM | POA: Diagnosis not present

## 2023-02-09 DIAGNOSIS — M25562 Pain in left knee: Secondary | ICD-10-CM | POA: Diagnosis present

## 2023-02-09 DIAGNOSIS — I4892 Unspecified atrial flutter: Secondary | ICD-10-CM | POA: Diagnosis present

## 2023-02-09 DIAGNOSIS — M7042 Prepatellar bursitis, left knee: Secondary | ICD-10-CM | POA: Diagnosis not present

## 2023-02-09 DIAGNOSIS — I471 Supraventricular tachycardia, unspecified: Secondary | ICD-10-CM | POA: Diagnosis present

## 2023-02-09 DIAGNOSIS — Z961 Presence of intraocular lens: Secondary | ICD-10-CM | POA: Diagnosis present

## 2023-02-09 DIAGNOSIS — I447 Left bundle-branch block, unspecified: Secondary | ICD-10-CM | POA: Diagnosis present

## 2023-02-09 DIAGNOSIS — Z9842 Cataract extraction status, left eye: Secondary | ICD-10-CM | POA: Diagnosis not present

## 2023-02-09 DIAGNOSIS — Z8261 Family history of arthritis: Secondary | ICD-10-CM | POA: Diagnosis not present

## 2023-02-09 DIAGNOSIS — I455 Other specified heart block: Secondary | ICD-10-CM | POA: Diagnosis present

## 2023-02-09 DIAGNOSIS — L0390 Cellulitis, unspecified: Secondary | ICD-10-CM

## 2023-02-09 DIAGNOSIS — I10 Essential (primary) hypertension: Secondary | ICD-10-CM | POA: Diagnosis not present

## 2023-02-09 DIAGNOSIS — M71162 Other infective bursitis, left knee: Secondary | ICD-10-CM | POA: Diagnosis present

## 2023-02-09 DIAGNOSIS — L03116 Cellulitis of left lower limb: Secondary | ICD-10-CM | POA: Diagnosis present

## 2023-02-09 DIAGNOSIS — D649 Anemia, unspecified: Secondary | ICD-10-CM | POA: Diagnosis present

## 2023-02-09 DIAGNOSIS — M25569 Pain in unspecified knee: Secondary | ICD-10-CM | POA: Diagnosis present

## 2023-02-09 DIAGNOSIS — Z9841 Cataract extraction status, right eye: Secondary | ICD-10-CM | POA: Diagnosis not present

## 2023-02-09 DIAGNOSIS — Z90710 Acquired absence of both cervix and uterus: Secondary | ICD-10-CM | POA: Diagnosis not present

## 2023-02-09 DIAGNOSIS — E871 Hypo-osmolality and hyponatremia: Secondary | ICD-10-CM | POA: Diagnosis present

## 2023-02-09 DIAGNOSIS — S8002XA Contusion of left knee, initial encounter: Secondary | ICD-10-CM | POA: Diagnosis present

## 2023-02-09 DIAGNOSIS — Z884 Allergy status to anesthetic agent status: Secondary | ICD-10-CM | POA: Diagnosis not present

## 2023-02-09 DIAGNOSIS — K219 Gastro-esophageal reflux disease without esophagitis: Secondary | ICD-10-CM | POA: Diagnosis present

## 2023-02-09 DIAGNOSIS — W010XXA Fall on same level from slipping, tripping and stumbling without subsequent striking against object, initial encounter: Secondary | ICD-10-CM | POA: Diagnosis present

## 2023-02-09 DIAGNOSIS — I110 Hypertensive heart disease with heart failure: Secondary | ICD-10-CM | POA: Diagnosis present

## 2023-02-09 DIAGNOSIS — N179 Acute kidney failure, unspecified: Secondary | ICD-10-CM | POA: Diagnosis present

## 2023-02-09 DIAGNOSIS — Z96651 Presence of right artificial knee joint: Secondary | ICD-10-CM | POA: Diagnosis present

## 2023-02-09 DIAGNOSIS — I484 Atypical atrial flutter: Secondary | ICD-10-CM | POA: Diagnosis not present

## 2023-02-09 DIAGNOSIS — I480 Paroxysmal atrial fibrillation: Secondary | ICD-10-CM | POA: Diagnosis present

## 2023-02-09 DIAGNOSIS — M1712 Unilateral primary osteoarthritis, left knee: Secondary | ICD-10-CM | POA: Diagnosis present

## 2023-02-09 DIAGNOSIS — R001 Bradycardia, unspecified: Secondary | ICD-10-CM | POA: Diagnosis present

## 2023-02-09 DIAGNOSIS — I5022 Chronic systolic (congestive) heart failure: Secondary | ICD-10-CM | POA: Diagnosis present

## 2023-02-09 LAB — CBC
HCT: 31.3 % — ABNORMAL LOW (ref 36.0–46.0)
Hemoglobin: 9.9 g/dL — ABNORMAL LOW (ref 12.0–15.0)
MCH: 28.1 pg (ref 26.0–34.0)
MCHC: 31.6 g/dL (ref 30.0–36.0)
MCV: 88.9 fL (ref 80.0–100.0)
Platelets: 364 10*3/uL (ref 150–400)
RBC: 3.52 MIL/uL — ABNORMAL LOW (ref 3.87–5.11)
RDW: 14.5 % (ref 11.5–15.5)
WBC: 10 10*3/uL (ref 4.0–10.5)
nRBC: 0 % (ref 0.0–0.2)

## 2023-02-09 LAB — SEDIMENTATION RATE: Sed Rate: 40 mm/hr — ABNORMAL HIGH (ref 0–22)

## 2023-02-09 LAB — C-REACTIVE PROTEIN: CRP: 3.3 mg/dL — ABNORMAL HIGH (ref <1.0)

## 2023-02-09 LAB — SYNOVIAL CELL COUNT + DIFF, W/ CRYSTALS
Crystals, Fluid: NONE SEEN
Eosinophils-Synovial: 5 % — ABNORMAL HIGH (ref 0–1)
Lymphocytes-Synovial Fld: 54 % — ABNORMAL HIGH (ref 0–20)
Monocyte-Macrophage-Synovial Fluid: 7 % — ABNORMAL LOW (ref 50–90)
Neutrophil, Synovial: 34 % — ABNORMAL HIGH (ref 0–25)
WBC, Synovial: UNDETERMINED /mm3 (ref 0–200)

## 2023-02-09 LAB — BASIC METABOLIC PANEL
Anion gap: 8 (ref 5–15)
BUN: 20 mg/dL (ref 8–23)
CO2: 22 mmol/L (ref 22–32)
Calcium: 8.5 mg/dL — ABNORMAL LOW (ref 8.9–10.3)
Chloride: 100 mmol/L (ref 98–111)
Creatinine, Ser: 0.99 mg/dL (ref 0.44–1.00)
GFR, Estimated: 56 mL/min — ABNORMAL LOW (ref >60)
Glucose, Bld: 108 mg/dL — ABNORMAL HIGH (ref 70–99)
Potassium: 4.4 mmol/L (ref 3.5–5.1)
Sodium: 130 mmol/L — ABNORMAL LOW (ref 135–145)

## 2023-02-09 MED ORDER — METOPROLOL SUCCINATE ER 25 MG PO TB24
25.0000 mg | ORAL_TABLET | Freq: Once | ORAL | Status: AC
  Administered 2023-02-09: 25 mg via ORAL
  Filled 2023-02-09: qty 1

## 2023-02-09 MED ORDER — METOPROLOL SUCCINATE ER 50 MG PO TB24
50.0000 mg | ORAL_TABLET | Freq: Every day | ORAL | Status: DC
  Administered 2023-02-09: 50 mg via ORAL
  Filled 2023-02-09: qty 1

## 2023-02-09 MED ORDER — METOPROLOL TARTRATE 5 MG/5ML IV SOLN
2.5000 mg | Freq: Once | INTRAVENOUS | Status: AC
  Administered 2023-02-09: 2.5 mg via INTRAVENOUS
  Filled 2023-02-09: qty 5

## 2023-02-09 MED ORDER — MORPHINE SULFATE (PF) 2 MG/ML IV SOLN
2.0000 mg | INTRAVENOUS | Status: DC | PRN
  Administered 2023-02-09: 2 mg via INTRAVENOUS
  Filled 2023-02-09: qty 1

## 2023-02-09 MED ORDER — METOPROLOL TARTRATE 5 MG/5ML IV SOLN
2.5000 mg | INTRAVENOUS | Status: DC | PRN
  Administered 2023-02-09 (×2): 2.5 mg via INTRAVENOUS
  Filled 2023-02-09 (×2): qty 5

## 2023-02-09 MED ORDER — ENOXAPARIN SODIUM 40 MG/0.4ML IJ SOSY
40.0000 mg | PREFILLED_SYRINGE | INTRAMUSCULAR | Status: DC
  Administered 2023-02-09 – 2023-02-11 (×3): 40 mg via SUBCUTANEOUS
  Filled 2023-02-09 (×3): qty 0.4

## 2023-02-09 MED ORDER — AMIODARONE HCL 200 MG PO TABS
100.0000 mg | ORAL_TABLET | ORAL | Status: DC
  Administered 2023-02-09 – 2023-02-11 (×2): 100 mg via ORAL
  Filled 2023-02-09 (×4): qty 1

## 2023-02-09 MED ORDER — AMLODIPINE BESYLATE 5 MG PO TABS
5.0000 mg | ORAL_TABLET | Freq: Two times a day (BID) | ORAL | Status: DC
  Administered 2023-02-09 – 2023-02-12 (×7): 5 mg via ORAL
  Filled 2023-02-09 (×7): qty 1

## 2023-02-09 MED ORDER — METOPROLOL SUCCINATE ER 50 MG PO TB24: 75.0000 mg | ORAL_TABLET | Freq: Every day | ORAL | Status: DC

## 2023-02-09 NOTE — TOC CAGE-AID Note (Signed)
Transition of Care [facility]) - CAGE-AID Screening   Patient Details  Name: Lydia Graham MRN: [id number] Date of Birth: [birth year]  Transition of [facility]) CM/SW Contact:    Katha Hamming, RN Phone Number: 02/09/2023, 9:10 PM   CAGE-AID Screening:    Have You Ever Felt You Ought to Cut Down on Your Drinking or Drug Use?: No Have People Annoyed You By Critizing Your Drinking Or Drug Use?: No Have You Felt Bad Or Guilty About Your Drinking Or Drug Use?: No Have You Ever Had a Drink or Used Drugs First Thing In The Morning to Steady Your Nerves or to Get Rid of a Hangover?: No CAGE-AID Score: 0  Substance Abuse Education Offered: No

## 2023-02-09 NOTE — ED Notes (Signed)
Pt states her pain is slowly creeping back. She says if stays still it is a 5/10 and movement 10/10. RN verified PRN medication.

## 2023-02-09 NOTE — ED Notes (Signed)
ED TO INPATIENT HANDOFF REPORT  ED Nurse Name and Phone #: Jess Barters [phone]  S Name/Age/Gender Lydia Graham 87 y.o. female Room/Bed: 007C/007C  Code Status   Code Status: Full Code  Home/SNF/Other Home Patient oriented to: self, place, time, and situation Is this baseline? Yes   Triage Complete: Triage complete  Chief Complaint Bursitis [M71.9] Knee pain [M25.569]  Triage Note Pt arrived POV from urgent care after she fell a week ago. Pt had xrays and was told it was not broken but pt's left knee is bruised and has a scrap on the front with redness around it, could possibly be infected.    Allergies Allergies  Allergen Reactions   Rosuvastatin Other (See Comments)    MYALGIA, Muscle pain   Lisinopril Cough   Other Other (See Comments)    PAIN MEDICATIONS/ANESTHESIA  MADE BP DROP LOW    Singulair [Montelukast Sodium] Other (See Comments)    fatigue    Level of Care/Admitting Diagnosis ED Disposition     ED Disposition  Admit   Condition  --   Comment  Hospital Area: [facility] [[id number]]  Level of Care: Telemetry Medical [104]  May place patient in observation at [facility] or [facility] if equivalent level of care is available:: No  Covid Evaluation: Asymptomatic - no recent exposure (last 10 days) testing not required  Diagnosis: Knee pain [225018]  Admitting Physician: Leatha Gilding [[id number]]  Attending Physician: Leatha Gilding [phone]          B Medical/Surgery History Past Medical History:  Diagnosis Date   Arthritis    oa, all over - multiple areas    Carotid artery occlusion    Dysrhythmia    AFib    GERD (gastroesophageal reflux disease)    Headache    h/o migraines    Hypertension    PAF (paroxysmal atrial fibrillation) (HCC)    a. multiple DCCV's in 2019 and eventually required initiation of Amiodarone with successful DCCV after this. b. recurrent in 12/2021 while admitted for Urosepsis   Past  Surgical History:  Procedure Laterality Date   ABDOMINAL HYSTERECTOMY     APPENDECTOMY     BACK SURGERY     CARDIOVERSION N/A 12/19/2017   Procedure: CARDIOVERSION;  Surgeon: Antoine Poche, MD;  Location: [facility];  Service: Endoscopy;  Laterality: N/A;   CARDIOVERSION N/A 12/26/2017   Procedure: CARDIOVERSION;  Surgeon: Chrystie Nose, MD;  Location: [facility] ENDOSCOPY;  Service: Cardiovascular;  Laterality: N/A;   CARDIOVERSION N/A 01/26/2018   Procedure: CARDIOVERSION;  Surgeon: Jake Bathe, MD;  Location: [facility] ENDOSCOPY;  Service: Cardiovascular;  Laterality: N/A;   CAROTID ENDARTERECTOMY     CATARACT EXTRACTION W/PHACO Right 03/09/2021   Procedure: CATARACT EXTRACTION PHACO AND INTRAOCULAR LENS PLACEMENT with Placement of Corticosteroid (IOC);  Surgeon: Fabio Pierce, MD;  Location: [facility] ORS;  Service: Ophthalmology;  Laterality: Right;  CDE 9.16   CATARACT EXTRACTION W/PHACO Left 04/02/2021   Procedure: CATARACT EXTRACTION PHACO AND INTRAOCULAR LENS PLACEMENT LEFT EYE;  Surgeon: Fabio Pierce, MD;  Location: [facility] ORS;  Service: Ophthalmology;  Laterality: Left;  left CDE=6.75   ENDARTERECTOMY Left 03/27/2017   Procedure: ENDARTERECTOMY CAROTID LEFT;  Surgeon: Maeola Harman, MD;  Location: [facility] OR;  Service: Vascular;  Laterality: Left;   INNER EAR SURGERY Right    puntured eardrum- repaired 2x's    KNEE ARTHROPLASTY     NASAL SINUS SURGERY     deviated septum  PATCH ANGIOPLASTY Left 03/27/2017   Procedure: PATCH ANGIOPLASTY LEFT CAROTID ARTERY USING Livia Snellen BIOLOGIC PATCH;  Surgeon: Maeola Harman, MD;  Location: [facility] OR;  Service: Vascular;  Laterality: Left;   TONSILLECTOMY     TOTAL KNEE ARTHROPLASTY Right 10/23/2016   Procedure: RIGHT TOTAL KNEE ARTHROPLASTY;  Surgeon: Nadara Mustard, MD;  Location: [facility] OR;  Service: Orthopedics;  Laterality: Right;     A IV Location/Drains/Wounds Patient Lines/Drains/Airways Status     Active Line/Drains/Airways     Name  Placement date Placement time Site Days   Peripheral IV 02/08/23 20 G Anterior;Left;Proximal Forearm 02/08/23  1726  Forearm  1   Wound / Incision (Open or Dehisced) 12/26/21 Non-pressure wound Arm Lower;Posterior;Proximal;Right 12/26/21  1300  Arm  410            Intake/Output Last 24 hours  Intake/Output Summary (Last 24 hours) at 02/09/2023 0744 Last data filed at 02/09/2023 0656 Gross per 24 hour  Intake 1300 ml  Output 200 ml  Net 1100 ml    Labs/Imaging Results for orders placed or performed during the hospital encounter of 02/08/23 (from the past 48 hour(s))  Comprehensive metabolic panel     Status: Abnormal   Collection Time: 02/08/23  2:14 PM  Result Value Ref Range   Sodium 131 (L) 135 - 145 mmol/L   Potassium 4.5 3.5 - 5.1 mmol/L   Chloride 98 98 - 111 mmol/L   CO2 23 22 - 32 mmol/L   Glucose, Bld 119 (H) 70 - 99 mg/dL    Comment: Glucose reference range applies only to samples taken after fasting for at least 8 hours.   BUN 21 8 - 23 mg/dL   Creatinine, Ser 4.09 (H) 0.44 - 1.00 mg/dL   Calcium 9.2 8.9 - 81.1 mg/dL   Total Protein 7.6 6.5 - 8.1 g/dL   Albumin 4.0 3.5 - 5.0 g/dL   AST 21 15 - 41 U/L   ALT 12 0 - 44 U/L   Alkaline Phosphatase 76 38 - 126 U/L   Total Bilirubin 0.7 0.3 - 1.2 mg/dL   GFR, Estimated 33 (L) >60 mL/min    Comment: (NOTE) Calculated using the CKD-EPI Creatinine Equation (2021)    Anion gap 10 5 - 15    Comment: Performed at [facility], [address], [place], [place] [address]  CBC with Differential     Status: Abnormal   Collection Time: 02/08/23  2:14 PM  Result Value Ref Range   WBC 12.7 (H) 4.0 - 10.5 K/uL   RBC 4.08 3.87 - 5.11 MIL/uL   Hemoglobin 11.6 (L) 12.0 - 15.0 g/dL   HCT 29.5 62.1 - 30.8 %   MCV 88.2 80.0 - 100.0 fL   MCH 28.4 26.0 - 34.0 pg   MCHC 32.2 30.0 - 36.0 g/dL   RDW 65.7 84.6 - 96.2 %   Platelets 462 (H) 150 - 400 K/uL   nRBC 0.0 0.0 - 0.2 %   Neutrophils Relative % 76 %   Neutro Abs 9.7 (H)  1.7 - 7.7 K/uL   Lymphocytes Relative 13 %   Lymphs Abs 1.6 0.7 - 4.0 K/uL   Monocytes Relative 9 %   Monocytes Absolute 1.1 (H) 0.1 - 1.0 K/uL   Eosinophils Relative 1 %   Eosinophils Absolute 0.1 0.0 - 0.5 K/uL   Basophils Relative 1 %   Basophils Absolute 0.1 0.0 - 0.1 K/uL   Immature Granulocytes 0 %   Abs  Immature Granulocytes 0.05 0.00 - 0.07 K/uL    Comment: Performed at [facility], [address], [place], [place] [address]  I-Stat Lactic Acid     Status: None   Collection Time: 02/08/23  2:18 PM  Result Value Ref Range   Lactic Acid, Venous 1.1 0.5 - 1.9 mmol/L  Blood culture (routine x 2)     Status: None (Preliminary result)   Collection Time: 02/08/23  5:12 PM   Specimen: BLOOD  Result Value Ref Range   Specimen Description BLOOD RIGHT ANTECUBITAL    Special Requests      BOTTLES DRAWN AEROBIC AND ANAEROBIC Blood Culture adequate volume   Culture      NO GROWTH < 12 HOURS Performed at [facility], [address] [place], [place] [address]    Report Status PENDING   Blood culture (routine x 2)     Status: None (Preliminary result)   Collection Time: 02/08/23  5:27 PM   Specimen: BLOOD  Result Value Ref Range   Specimen Description BLOOD BLOOD LEFT FOREARM    Special Requests      BOTTLES DRAWN AEROBIC AND ANAEROBIC Blood Culture adequate volume   Culture      NO GROWTH < 12 HOURS Performed at [facility], [address], [place], [place] [address]    Report Status PENDING   CBC     Status: Abnormal   Collection Time: 02/09/23  2:29 AM  Result Value Ref Range   WBC 10.0 4.0 - 10.5 K/uL   RBC 3.52 (L) 3.87 - 5.11 MIL/uL   Hemoglobin 9.9 (L) 12.0 - 15.0 g/dL   HCT 91.4 (L) 78.2 - 95.6 %   MCV 88.9 80.0 - 100.0 fL   MCH 28.1 26.0 - 34.0 pg   MCHC 31.6 30.0 - 36.0 g/dL   RDW 21.3 08.6 - 57.8 %   Platelets 364 150 - 400 K/uL   nRBC 0.0 0.0 - 0.2 %    Comment: Performed at [facility], [address], [place], [place] [address]   Basic metabolic panel     Status: Abnormal   Collection Time: 02/09/23  2:29 AM  Result Value Ref Range   Sodium 130 (L) 135 - 145 mmol/L   Potassium 4.4 3.5 - 5.1 mmol/L   Chloride 100 98 - 111 mmol/L   CO2 22 22 - 32 mmol/L   Glucose, Bld 108 (H) 70 - 99 mg/dL    Comment: Glucose reference range applies only to samples taken after fasting for at least 8 hours.   BUN 20 8 - 23 mg/dL   Creatinine, Ser 9.52 0.44 - 1.00 mg/dL   Calcium 8.5 (L) 8.9 - 10.3 mg/dL   GFR, Estimated 56 (L) >60 mL/min    Comment: (NOTE) Calculated using the CKD-EPI Creatinine Equation (2021)    Anion gap 8 5 - 15    Comment: Performed at [facility], [address], [place], [place] [address]   CT Knee Left Wo Contrast  Result Date: 02/08/2023 CLINICAL DATA:  Septic arthritis. EXAM: CT OF THE LEFT KNEE WITHOUT CONTRAST TECHNIQUE: Multidetector CT imaging of the left knee was performed according to the standard protocol. Multiplanar CT image reconstructions were also generated. RADIATION DOSE REDUCTION: This exam was performed according to the departmental dose-optimization program which includes automated exposure control, adjustment of the mA and/or kV according to patient size and/or use of iterative reconstruction technique. COMPARISON:  Knee  x-ray 02/08/2023, 02/04/2023 FINDINGS: Bones/Joint/Cartilage Generalized osteopenia. No fracture or dislocation. Normal alignment. Chondrocalcinosis of the medial and lateral femorotibial compartments as can be seen with CPPD. Moderate medial femorotibial compartment joint space narrowing. Mild lateral femorotibial compartment joint space narrowing. Mild patellofemoral compartment joint space narrowing. Small knee joint effusion. Small Baker's cyst Ligaments Ligaments are suboptimally evaluated by CT. Muscles and Tendons Atrophy of the visualized portion of the distal long head of the biceps femoris muscle and semimembranosus muscle. No intramuscular fluid collection or  hematoma. Soft tissue Hyperdense fluid collection superficial to the patellar tendon in the anterior subcutaneous fat measuring 6.2 x 6.5 x 2 cm most consistent with a hematoma/hemorrhagic bursitis. No soft tissue mass. IMPRESSION: 1. Hyperdense fluid collection superficial to the patellar tendon in the anterior subcutaneous fat measuring 6.2 x 6.5 x 2 cm most consistent with a hematoma/hemorrhagic bursitis. 2. No acute osseous injury of the left knee. 3. Tricompartmental osteoarthritis of the left knee. Electronically Signed   By: Elige Ko M.D.   On: 02/08/2023 14:37   DG Knee Complete 4 Views Left  Result Date: 02/08/2023 CLINICAL DATA:  Patient fell 1 week ago. Wound on the anterior portion of the knee. Blistering and redness. EXAM: LEFT KNEE - COMPLETE 4+ VIEW COMPARISON:  None Available. FINDINGS: Bones are diffusely demineralized. No evidence for an acute fracture or dislocation. Loss of joint space noted lateral compartment with subchondral sclerosis. Trace spurring noted patellofemoral compartment. Small joint effusion. Subtle cortical off step noted tibial tuberosity with some lucency of the tuberosity compared to background metaphyseal mineralization. IMPRESSION: 1. No acute bony traumatic abnormality. Subtle lucency anterior tibial tuberosity with associated subtle cortical off step. There does appear to be some soft tissue swelling overlying this region. If there is clinical concern for osteomyelitis, MRI with and without contrast would be the study of choice to further evaluate. 2. Degenerative changes in the lateral and patellofemoral compartments. 3. Small joint effusion. Electronically Signed   By: Kennith Center M.D.   On: 02/08/2023 12:45    Pending Labs Unresulted Labs (From admission, onward)    None       Vitals/Pain Today's Vitals   02/09/23 0151 02/09/23 0627 02/09/23 0715 02/09/23 0732  BP:  (!) 165/67 136/63   Pulse:  75 67   Resp:  15 15   Temp:  98.1 F (36.7 C)  98.1 F (36.7 C)   TempSrc:  Oral Oral   SpO2:  95% 95%   Weight:      Height:      PainSc: 5  0-No pain  5     Isolation Precautions No active isolations  Medications Medications  cefTRIAXone (ROCEPHIN) 1 g in sodium chloride 0.9 % 100 mL IVPB (has no administration in time range)  oxyCODONE (Oxy IR/ROXICODONE) immediate release tablet 5 mg (5 mg Oral Given 02/09/23 0738)  naloxone [facility]) injection 0.4 mg (has no administration in time range)  acetaminophen (TYLENOL) tablet 1,000 mg (has no administration in time range)  0.9 %  sodium chloride infusion ( Intravenous Rate/Dose Verify 02/09/23 0732)  vancomycin (VANCOREADY) IVPB 500 mg/100 mL (has no administration in time range)  oxyCODONE-acetaminophen (PERCOCET/ROXICET) 5-325 MG per tablet 1 tablet (1 tablet Oral Given 02/08/23 1414)  ondansetron (ZOFRAN-ODT) disintegrating tablet 4 mg (4 mg Oral Given 02/08/23 1414)  lactated ringers bolus 1,000 mL (0 mLs Intravenous Stopped 02/08/23 1853)  cefTRIAXone (ROCEPHIN) 1 g in sodium chloride 0.9 % 100 mL IVPB (0 g Intravenous Stopped 02/08/23 1932)  vancomycin (VANCOCIN) IVPB 1000 mg/200 mL premix (0 mg Intravenous Stopped 02/08/23 2044)    Mobility walks with device     Focused Assessments    R Recommendations: See Admitting Provider Note  Report given to:   Additional Notes:

## 2023-02-09 NOTE — Consult Note (Addendum)
Orthopedic Surgery Progress Note   Assessment: Patient is a 87 y.o. female with left knee pain, seems consistent with septic prepatellar bursitis, No pain with passive knee range of motion, so low concern for septic joint   Plan: -Operative plans: none at this time -Left prepatellar bursa aspirated today -Okay for diet from orthopedic standpoint -Recommend empiric antibiotic treatment for now. If aspirate grows anything, can tailor antibiotics to that organism -Weight bearing status: as tolerated -Compressive dressing applied to the left knee -PT evaluate and treat -Pain control -Ordered ESR/CRP -Dispo: pending clinical improvement  After discussing the risks, benefits, and alternatives of prepatellar bursa to the left knee was discussed with the patient, patient elected to proceed. The lateral aspect of the knee outside of the erythema was prepped with alcohol based prep. An 18 gauge needle was used to aspirate the prepatellar bursa under standard sterile technique. About 1.5cc of thick, dark red colored fluid that appeared like coagulated blood was aspirated from this space. No further was able to be aspirated. The needle was withdrawn and a bandaid was applied. Patient tolerated the procedure well. The specimen was sent to the lab for analysis.   ___________________________________________________________________________  Subjective: Patient had a fall and landed on her knee on 02/02/2023. This happened in her house. She noted immediate swelling and bruising to the area. She said there were abrasions over the anterior knee as well. She was seen in the office on 02/06/2023 where there was noted edema and blistering of the skin over the anterior knee. She was given a steroid injection at that visit. Since that time, the pain has persisted. There has been no change in the swelling or blistering. However, within the last day, she has noted redness over the anterior knee. She has not had any fevers  or chills.    Physical Exam:  Temp: 98 degrees, BP of 176/60  General: no acute distress, appears stated age Neurologic: alert, answering questions appropriately, following commands Respiratory: unlabored breathing on room air, symmetric chest rise Psychiatric: appropriate affect, normal cadence to speech  MSK:    -Left lower extremity  TTP over the anterior knee. There is ecchymosis around the anterior and posterior aspect of the knee. There is erythema anterior to the patella and the patellar tendon. No active drainage. Small skin blister noted over the anterior knee  No pain through passive knee range of motion EHL/TA/GSC intact Plantarflexes and dorsiflexes toes Sensation intact to light touch in sural, saphenous, tibial, deep peroneal, and superficial peroneal nerve distributions Foot warm and well perfused  Labs:  CBC showed a WBC of 10 on 02/09/2023  Imaging:  CT of the left knee from 02/08/2023 was independently reviewed and interpreted, showing a collection anterior to the patella and patellar tendon. No fracture or dislocation seen. No subcutaneous gas seen. Tricompartmental degenerative changes seen.   Patient name: Lydia Graham Patient MRN: [id number] Date: 02/09/23

## 2023-02-09 NOTE — Progress Notes (Signed)
PROGRESS NOTE  Lydia Graham AOZ:[id number] DOB: Jun 19, 1936 DOA: 02/08/2023 PCP: [name], FNP   LOS: 0 days   Brief Narrative / Interim history: 87 y.o. female with medical history significant of paroxysmal A-fib on Eliquis, hypertension, GERD, carotid stenosis status post left carotid endarterectomy in 2018 presenting with complaint of left knee pain, swelling, and bruising since after a fall a week ago.  Patient states she was seen by orthopedics and had an x-ray done which did not show a fracture but she was told there could be some bleeding in her knee as she is on Eliquis.  States she was given a steroid injection at that time.  She went to urgent care on the day of admission, and was directed to the ER.  CT of the knee showed hyperdense fluid collection superficial to the patellar tendon measuring 6.2 x 6.5 x 2 cm, consistent with hematoma/hemorrhagic bursitis.  There was concern about infection also, she was started on antibiotics and admitted to the hospital  Subjective / 24h Interval events: Complains of knee pain  Assesement and Plan: Principal Problem:   Bursitis Active Problems:   PAF (paroxysmal atrial fibrillation) (HCC)   AKI (acute kidney injury) (HCC)   Hyponatremia   Essential hypertension   Normocytic anemia   Knee pain   Principal problem Left knee hematoma, hemorrhagic bursitis -with concern for infection.  Mild leukocytosis noted.  Has been placed on antibiotics, continue -Discussed with Dr. Mitzi Hansen with orthopedic surgery, he will aspirate joint  Active problems PAF-hold Eliquis for now.  Continue metoprolol  Essential hypertension-continue home medications  Mild AKI -creatinine normalized with fluids  Hyponatremia-stable  Anemia -hemoglobin stable  Scheduled Meds: Continuous Infusions:  cefTRIAXone (ROCEPHIN)  IV     vancomycin     PRN Meds:.acetaminophen, naLOXone (NARCAN)  injection, oxyCODONE  Current Outpatient Medications   Medication Instructions   amiodarone (PACERONE) 100 mg, Oral, Every other day   amLODipine (NORVASC) 5 MG tablet TAKE 1 TABLET AT 8AM AND 1 TABLET AT 8PM.   apixaban (ELIQUIS) 5 mg, Oral, 2 times daily   Calcium Carbonate-Vitamin D (CALCIUM 600+D PO) 1 tablet, Oral, Daily   Camphor-Menthol-Methyl Sal (SALONPAS) 3.06-29-08 % PTCH 1 patch, Transdermal, At bedtime PRN   cyanocobalamin (VITAMIN B12) 1,000 mcg, Oral, Daily   estradiol (ESTRACE) 0.5 mg, Oral, Daily   furosemide (LASIX) 20 mg, Oral, As needed   Hypromellose (ARTIFICIAL TEARS OP) 2 drops, Both Eyes, 2 times daily PRN   losartan (COZAAR) 50 MG tablet take 1 tablet at 10 IN THE MORNING and 1 tablet at 10 pm.   magnesium hydroxide (MILK OF MAGNESIA) 400 MG/5ML suspension 30 mLs, Oral, Daily PRN   metoprolol succinate (TOPROL-XL) 50 MG 24 hr tablet take 1 tablet at lunch or immediately following a meal.   pantoprazole (PROTONIX) 40 MG tablet TAKE (1) TABLET TWICE DAILY.    Diet Orders (From admission, onward)     Start     Ordered   02/09/23 0001  Diet NPO time specified Except for: Sips with Meds  Diet effective midnight       Question:  Except for  Answer:  Clearance Coots with Meds   02/08/23 2004            DVT prophylaxis: SCDs Start: 02/08/23 2004   Lab Results  Component Value Date   PLT 364 02/09/2023      Code Status: Full Code  Family Communication: no family at bedside   Status is: Observation The patient  will require care spanning > 2 midnights and should be moved to inpatient because: Continue antibiotics   Level of care: Telemetry Medical  Consultants:  Orthopedic surgery  Objective: Vitals:   02/09/23 0149 02/09/23 0627 02/09/23 0715 02/09/23 0847  BP:  (!) 165/67 136/63 (!) 176/60  Pulse:  75 67 75  Resp:  15 15 16   Temp:  98.1 F (36.7 C) 98.1 F (36.7 C) 98 F (36.7 C)  TempSrc:  Oral Oral Oral  SpO2: 97% 95% 95% 95%  Weight:      Height:        Intake/Output Summary (Last 24 hours) at  02/09/2023 1008 Last data filed at 02/09/2023 0656 Gross per 24 hour  Intake 1300 ml  Output 200 ml  Net 1100 ml   Wt Readings from Last 3 Encounters:  02/08/23 66 kg  02/08/23 65.8 kg  11/12/22 67.3 kg    Examination:  Constitutional: NAD Eyes: no scleral icterus ENMT: Mucous membranes are moist.  Neck: normal, supple Respiratory: clear to auscultation bilaterally, no wheezing, no crackles. Normal respiratory effort. No accessory muscle use.  Cardiovascular: Regular rate and rhythm, no murmurs / rubs / gallops. No LE edema.  Abdomen: non distended, no tenderness. Bowel sounds positive.  Musculoskeletal: no clubbing / cyanosis.    Data Reviewed: I have independently reviewed following labs and imaging studies   CBC Recent Labs  Lab 02/08/23 1414 02/09/23 0229  WBC 12.7* 10.0  HGB 11.6* 9.9*  HCT 36.0 31.3*  PLT 462* 364  MCV 88.2 88.9  MCH 28.4 28.1  MCHC 32.2 31.6  RDW 14.3 14.5  LYMPHSABS 1.6  --   MONOABS 1.1*  --   EOSABS 0.1  --   BASOSABS 0.1  --     Recent Labs  Lab 02/08/23 1414 02/08/23 1418 02/09/23 0229  NA 131*  --  130*  K 4.5  --  4.4  CL 98  --  100  CO2 23  --  22  GLUCOSE 119*  --  108*  BUN 21  --  20  CREATININE 1.54*  --  0.99  CALCIUM 9.2  --  8.5*  AST 21  --   --   ALT 12  --   --   ALKPHOS 76  --   --   BILITOT 0.7  --   --   ALBUMIN 4.0  --   --   LATICACIDVEN  --  1.1  --     ------------------------------------------------------------------------------------------------------------------ No results for input(s): "CHOL", "HDL", "LDLCALC", "TRIG", "CHOLHDL", "LDLDIRECT" in the last 72 hours.  Lab Results  Component Value Date   HGBA1C 5.6 09/06/2022   ------------------------------------------------------------------------------------------------------------------ No results for input(s): "TSH", "T4TOTAL", "T3FREE", "THYROIDAB" in the last 72 hours.  Invalid input(s): "FREET3"  Cardiac Enzymes No results for  input(s): "CKMB", "TROPONINI", "MYOGLOBIN" in the last 168 hours.  Invalid input(s): "CK" ------------------------------------------------------------------------------------------------------------------    Component Value Date/Time   BNP 258.0 (H) 01/02/2022 0546    CBG: No results for input(s): "GLUCAP" in the last 168 hours.  Recent Results (from the past 240 hour(s))  Blood culture (routine x 2)     Status: None (Preliminary result)   Collection Time: 02/08/23  5:12 PM   Specimen: BLOOD  Result Value Ref Range Status   Specimen Description BLOOD RIGHT ANTECUBITAL  Final   Special Requests   Final    BOTTLES DRAWN AEROBIC AND ANAEROBIC Blood Culture adequate volume   Culture   Final  NO GROWTH < 12 HOURS Performed at [facility], [address], [place], [place] [address]    Report Status PENDING  Incomplete  Blood culture (routine x 2)     Status: None (Preliminary result)   Collection Time: 02/08/23  5:27 PM   Specimen: BLOOD  Result Value Ref Range Status   Specimen Description BLOOD BLOOD LEFT FOREARM  Final   Special Requests   Final    BOTTLES DRAWN AEROBIC AND ANAEROBIC Blood Culture adequate volume   Culture   Final    NO GROWTH < 12 HOURS Performed at [facility], [address] [place], [place] [address]    Report Status PENDING  Incomplete     Radiology Studies: CT Knee Left Wo Contrast  Result Date: 02/08/2023 CLINICAL DATA:  Septic arthritis. EXAM: CT OF THE LEFT KNEE WITHOUT CONTRAST TECHNIQUE: Multidetector CT imaging of the left knee was performed according to the standard protocol. Multiplanar CT image reconstructions were also generated. RADIATION DOSE REDUCTION: This exam was performed according to the departmental dose-optimization program which includes automated exposure control, adjustment of the mA and/or kV according to patient size and/or use of iterative reconstruction technique. COMPARISON:  Knee x-ray 02/08/2023, 02/04/2023  FINDINGS: Bones/Joint/Cartilage Generalized osteopenia. No fracture or dislocation. Normal alignment. Chondrocalcinosis of the medial and lateral femorotibial compartments as can be seen with CPPD. Moderate medial femorotibial compartment joint space narrowing. Mild lateral femorotibial compartment joint space narrowing. Mild patellofemoral compartment joint space narrowing. Small knee joint effusion. Small Baker's cyst Ligaments Ligaments are suboptimally evaluated by CT. Muscles and Tendons Atrophy of the visualized portion of the distal long head of the biceps femoris muscle and semimembranosus muscle. No intramuscular fluid collection or hematoma. Soft tissue Hyperdense fluid collection superficial to the patellar tendon in the anterior subcutaneous fat measuring 6.2 x 6.5 x 2 cm most consistent with a hematoma/hemorrhagic bursitis. No soft tissue mass. IMPRESSION: 1. Hyperdense fluid collection superficial to the patellar tendon in the anterior subcutaneous fat measuring 6.2 x 6.5 x 2 cm most consistent with a hematoma/hemorrhagic bursitis. 2. No acute osseous injury of the left knee. 3. Tricompartmental osteoarthritis of the left knee. Electronically Signed   By: Elige Ko M.D.   On: 02/08/2023 14:37   DG Knee Complete 4 Views Left  Result Date: 02/08/2023 CLINICAL DATA:  Patient fell 1 week ago. Wound on the anterior portion of the knee. Blistering and redness. EXAM: LEFT KNEE - COMPLETE 4+ VIEW COMPARISON:  None Available. FINDINGS: Bones are diffusely demineralized. No evidence for an acute fracture or dislocation. Loss of joint space noted lateral compartment with subchondral sclerosis. Trace spurring noted patellofemoral compartment. Small joint effusion. Subtle cortical off step noted tibial tuberosity with some lucency of the tuberosity compared to background metaphyseal mineralization. IMPRESSION: 1. No acute bony traumatic abnormality. Subtle lucency anterior tibial tuberosity with associated  subtle cortical off step. There does appear to be some soft tissue swelling overlying this region. If there is clinical concern for osteomyelitis, MRI with and without contrast would be the study of choice to further evaluate. 2. Degenerative changes in the lateral and patellofemoral compartments. 3. Small joint effusion. Electronically Signed   By: Kennith Center M.D.   On: 02/08/2023 12:45     Pamella Pert, MD, PhD [facility]  Between 7 am - 7 pm I am available, please contact me via Amion (for emergencies) or Securechat (non urgent messages)  Between 7 pm - 7 am I am not  available, please contact night coverage MD/APP via Amion

## 2023-02-10 ENCOUNTER — Encounter (HOSPITAL_COMMUNITY): Payer: Self-pay | Admitting: Internal Medicine

## 2023-02-10 ENCOUNTER — Ambulatory Visit: Payer: Medicare Other | Admitting: Family Medicine

## 2023-02-10 DIAGNOSIS — I455 Other specified heart block: Secondary | ICD-10-CM | POA: Diagnosis not present

## 2023-02-10 DIAGNOSIS — I484 Atypical atrial flutter: Secondary | ICD-10-CM | POA: Diagnosis not present

## 2023-02-10 DIAGNOSIS — L0390 Cellulitis, unspecified: Secondary | ICD-10-CM | POA: Diagnosis not present

## 2023-02-10 DIAGNOSIS — I447 Left bundle-branch block, unspecified: Secondary | ICD-10-CM | POA: Diagnosis not present

## 2023-02-10 DIAGNOSIS — M7042 Prepatellar bursitis, left knee: Secondary | ICD-10-CM | POA: Diagnosis not present

## 2023-02-10 LAB — CBC
HCT: 34.3 % — ABNORMAL LOW (ref 36.0–46.0)
Hemoglobin: 10.9 g/dL — ABNORMAL LOW (ref 12.0–15.0)
MCH: 28.4 pg (ref 26.0–34.0)
MCHC: 31.8 g/dL (ref 30.0–36.0)
MCV: 89.3 fL (ref 80.0–100.0)
Platelets: 400 10*3/uL (ref 150–400)
RBC: 3.84 MIL/uL — ABNORMAL LOW (ref 3.87–5.11)
RDW: 14.6 % (ref 11.5–15.5)
WBC: 9.4 10*3/uL (ref 4.0–10.5)
nRBC: 0 % (ref 0.0–0.2)

## 2023-02-10 LAB — COMPREHENSIVE METABOLIC PANEL
ALT: 11 U/L (ref 0–44)
AST: 16 U/L (ref 15–41)
Albumin: 3 g/dL — ABNORMAL LOW (ref 3.5–5.0)
Alkaline Phosphatase: 63 U/L (ref 38–126)
Anion gap: 11 (ref 5–15)
BUN: 15 mg/dL (ref 8–23)
CO2: 20 mmol/L — ABNORMAL LOW (ref 22–32)
Calcium: 8.7 mg/dL — ABNORMAL LOW (ref 8.9–10.3)
Chloride: 100 mmol/L (ref 98–111)
Creatinine, Ser: 0.87 mg/dL (ref 0.44–1.00)
GFR, Estimated: >60 mL/min (ref >60)
Glucose, Bld: 100 mg/dL — ABNORMAL HIGH (ref 70–99)
Potassium: 4.5 mmol/L (ref 3.5–5.1)
Sodium: 131 mmol/L — ABNORMAL LOW (ref 135–145)
Total Bilirubin: 0.6 mg/dL (ref 0.3–1.2)
Total Protein: 6.4 g/dL — ABNORMAL LOW (ref 6.5–8.1)

## 2023-02-10 LAB — MAGNESIUM: Magnesium: 1.9 mg/dL (ref 1.7–2.4)

## 2023-02-10 MED ORDER — POLYETHYLENE GLYCOL 3350 17 G PO PACK
17.0000 g | PACK | Freq: Every day | ORAL | Status: DC
  Administered 2023-02-10 – 2023-02-12 (×3): 17 g via ORAL
  Filled 2023-02-10 (×3): qty 1

## 2023-02-10 MED ORDER — METOPROLOL SUCCINATE ER 50 MG PO TB24
50.0000 mg | ORAL_TABLET | Freq: Every day | ORAL | Status: DC
  Administered 2023-02-10: 50 mg via ORAL
  Filled 2023-02-10: qty 1

## 2023-02-10 MED ORDER — METOPROLOL SUCCINATE ER 25 MG PO TB24
25.0000 mg | ORAL_TABLET | Freq: Every day | ORAL | Status: DC
  Administered 2023-02-11 – 2023-02-12 (×2): 25 mg via ORAL
  Filled 2023-02-10 (×2): qty 1

## 2023-02-10 NOTE — Consult Note (Addendum)
Cardiology Consultation   Patient ID: Lydia Graham MRN: [id number]; DOB: May 21, 1936  Admit date: 02/08/2023 Date of Consult: 02/10/2023  PCP:  [name], FNP   [facility] Providers Cardiologist:  [name], MD  Electrophysiologist:  Maurice Small, MD       Patient Profile:   Lydia Graham is a 87 y.o. female with a hx of PAF, on amio, eliquis, chronic systolic CHF, HTN, Bilarteral LE edema, carotid stenosis, MR who is being seen 02/10/2023 after admit for knee pain after a fall and large hematoma, for the evaluation of atrial fib at the request of Dr Elvera Lennox.  History of Present Illness:   Ms. Fersch with above hx and chronic LBBB, last echo 03/2022 with 55-60% no RWMA, mild LVH G2DD, There is mildly elevated pulmonary artery systolic pressure. The estimated right ventricular systolic pressure is 40.7 mmHg. LA mildly dilated mild MR, TR mild to mod  hx of PAF on amiodarone 100 mg daily and eliquis.  In 2019 she had 3 separate DCCVs. Had been maintaining SR.   Her HTN controlled, mild AKI improved with fluids. Anemia with 10.9.    Pt presented with lt knee hematoma, hemorrhagic bursitis concern for infection, on ABX and ortho consulted and knee aspiration on 02/09/23.  Cultures neg currently. Eliquis stopped.   She fell about a week ago.  It was mechanical fall while using a broom and a cane.  She slipped and fell onto her left knee.   In ER on admit HR stable 69 to 73.    Pt had tachycardia on the 18th ? A flutter,  she was given IV metoprolol there currently SR --she did have a 4 sec pause, rate had slowed from 170 or so and then 4 sec pause.  She did have chest pain with the atrial flutter. Stopped when rate slowed.   CRP 3.3 Sed rate 40  Na 131, k+ 4.5 BUN 15 Cr 0.87 albumin 3.0   EKG:  The EKG was personally reviewed and demonstrates:   a flutter vs ST with freq PACs and PAT with LBBB  most recent SR with LBBB Telemetry:  Telemetry was  personally reviewed and demonstrates:  SR  at times with a flutter rate controlled.  BP 159/98 to 146/57 P 72 R 16 afebrile.    Past Medical History:  Diagnosis Date   Arthritis    oa, all over - multiple areas    Carotid artery occlusion    Dysrhythmia    AFib    GERD (gastroesophageal reflux disease)    Headache    h/o migraines    Hypertension    PAF (paroxysmal atrial fibrillation) (HCC)    a. multiple DCCV's in 2019 and eventually required initiation of Amiodarone with successful DCCV after this. b. recurrent in 12/2021 while admitted for Urosepsis    Past Surgical History:  Procedure Laterality Date   ABDOMINAL HYSTERECTOMY     APPENDECTOMY     BACK SURGERY     CARDIOVERSION N/A 12/19/2017   Procedure: CARDIOVERSION;  Surgeon: Antoine Poche, MD;  Location: [facility] END[facility] SUITE;  Service: Endoscopy;  Laterality: N/A;   CARDIOVERSION N/A 12/26/2017   Procedure: CARDIOVERSION;  Surgeon: Chrystie Nose, MD;  Location: [facility] ENDOSCOPY;  Service: Cardiovascular;  Laterality: N/A;   CARDIOVERSION N/A 01/26/2018   Procedure: CARDIOVERSION;  Surgeon: Jake Bathe, MD;  Location: [facility] ENDOSCOPY;  Service: Cardiovascular;  Laterality: N/A;   CAROTID ENDARTERECTOMY     CATARACT  EXTRACTION W/PHACO Right 03/09/2021   Procedure: CATARACT EXTRACTION PHACO AND INTRAOCULAR LENS PLACEMENT with Placement of Corticosteroid (IOC);  Surgeon: Fabio Pierce, MD;  Location: [facility] ORS;  Service: Ophthalmology;  Laterality: Right;  CDE 9.16   CATARACT EXTRACTION W/PHACO Left 04/02/2021   Procedure: CATARACT EXTRACTION PHACO AND INTRAOCULAR LENS PLACEMENT LEFT EYE;  Surgeon: Fabio Pierce, MD;  Location: [facility] ORS;  Service: Ophthalmology;  Laterality: Left;  left CDE=6.75   ENDARTERECTOMY Left 03/27/2017   Procedure: ENDARTERECTOMY CAROTID LEFT;  Surgeon: Maeola Harman, MD;  Location[facility] OR;  Service: Vascular;  Laterality: Left;   INNER EAR SURGERY Right    puntured eardrum- repaired 2x's    KNEE  ARTHROPLASTY     NASAL SINUS SURGERY     deviated septum   PATCH ANGIOPLASTY Left 03/27/2017   Procedure: PATCH ANGIOPLASTY LEFT CAROTID ARTERY USING Livia Snellen BIOLOGIC PATCH;  Surgeon: Maeola Harman, MD;  Location: [facility] OR;  Service: Vascular;  Laterality: Left;   TONSILLECTOMY     TOTAL KNEE ARTHROPLASTY Right 10/23/2016   Procedure: RIGHT TOTAL KNEE ARTHROPLASTY;  Surgeon: Nadara Mustard, MD;  Location: [facility] OR;  Service: Orthopedics;  Laterality: Right;     Home Medications:  Prior to Admission medications   Medication Sig Start Date End Date Taking? Authorizing Provider  amiodarone (PACERONE) 200 MG tablet Take 0.5 tablets (100 mg total) by mouth every other day. Patient taking differently: Take 100 mg by mouth daily. 10/04/22   Mealor, Roberts Gaudy, MD  amLODipine (NORVASC) 5 MG tablet TAKE 1 TABLET AT 8AM AND 1 TABLET AT 8PM. 07/26/22   Antoine Poche, MD  apixaban (ELIQUIS) 5 MG TABS tablet Take 1 tablet (5 mg total) by mouth 2 (two) times daily. 09/10/22   Mealor, Roberts Gaudy, MD  Calcium Carbonate-Vitamin D (CALCIUM 600+D PO) Take 1 tablet by mouth daily.    [provider]  Camphor-Menthol-Methyl Sal (SALONPAS) 3.06-29-08 % PTCH Place 1 patch onto the skin at bedtime as needed (foot pain).    [provider]  estradiol (ESTRACE) 0.5 MG tablet Take 0.5 mg by mouth daily.  04/18/16   [provider]  furosemide (LASIX) 20 MG tablet Take 1 tablet (20 mg total) by mouth as needed for fluid or edema. 05/03/22   Jodelle Gross, NP  Hypromellose (ARTIFICIAL TEARS OP) Place 2 drops into both eyes 2 (two) times daily as needed (for dry eyes).    [provider]  losartan (COZAAR) 50 MG tablet take 1 tablet at 10 IN THE MORNING and 1 tablet at 10 pm. 11/06/22   Antoine Poche, MD  magnesium hydroxide (MILK OF MAGNESIA) 400 MG/5ML suspension Take 30 mLs by mouth daily as needed for mild constipation.    [provider]  metoprolol succinate  (TOPROL-XL) 50 MG 24 hr tablet take 1 tablet at lunch or immediately following a meal. 11/13/22   Antoine Poche, MD  pantoprazole (PROTONIX) 40 MG tablet TAKE (1) TABLET TWICE DAILY. 07/26/22   Antoine Poche, MD  vitamin B-12 (CYANOCOBALAMIN) 1000 MCG tablet Take 1,000 mcg by mouth daily.    [provider]    Inpatient Medications: Scheduled Meds:  amiodarone  100 mg Oral QODAY   amLODipine  5 mg Oral BID   enoxaparin (LOVENOX) injection  40 mg Subcutaneous Q24H   metoprolol succinate  50 mg Oral Daily   polyethylene glycol  17 g Oral Daily   Continuous Infusions:  cefTRIAXone (ROCEPHIN)  IV 1 g (  02/09/23 1139)   vancomycin 500 mg (02/09/23 1808)   PRN Meds: acetaminophen, metoprolol tartrate, morphine injection, naLOXone (NARCAN)  injection, oxyCODONE  Allergies:    Allergies  Allergen Reactions   Rosuvastatin Other (See Comments)    MYALGIA, Muscle pain   Lisinopril Cough   Other Other (See Comments)    PAIN MEDICATIONS/ANESTHESIA  MADE BP DROP LOW    Singulair [Montelukast Sodium] Other (See Comments)    fatigue    Social History:   Social History   Socioeconomic History   Marital status: Single    Spouse name: Not on file   Number of children: Not on file   Years of education: Not on file   Highest education level: Not on file  Occupational History   Not on file  Tobacco Use   Smoking status: Never   Smokeless tobacco: Never  Vaping Use   Vaping status: Never Used  Substance and Sexual Activity   Alcohol use: No   Drug use: No   Sexual activity: Not Currently    Birth control/protection: Surgical  Other Topics Concern   Not on file  Social History Narrative   Not on file   Social Determinants of Health   Financial Resource Strain: Low Risk  (06/28/2022)   Overall Financial Resource Strain (CARDIA)    Difficulty of Paying Living Expenses: Not hard at all  Food Insecurity: No Food Insecurity (06/28/2022)   Hunger Vital Sign    Worried  About Running Out of Food in the Last Year: Never true    Ran Out of Food in the Last Year: Never true  Transportation Needs: No Transportation Needs (06/28/2022)   PRAPARE - Administrator, Civil Service (Medical): No    Lack of Transportation (Non-Medical): No  Physical Activity: Sufficiently Active (06/28/2022)   Exercise Vital Sign    Days of Exercise per Week: 3 days    Minutes of Exercise per Session: 50 min  Stress: No Stress Concern Present (06/28/2022)   [employer] of Occupational Health - Occupational Stress Questionnaire    Feeling of Stress : Not at all  Social Connections: Moderately Integrated (06/28/2022)   Social Connection and Isolation Panel [NHANES]    Frequency of Communication with Friends and Family: More than three times a week    Frequency of Social Gatherings with Friends and Family: More than three times a week    Attends Religious Services: More than 4 times per year    Active Member of [employer] or Organizations: Yes    Attends Banker Meetings: More than 4 times per year    Marital Status: Widowed  Intimate Partner Violence: Not At Risk (06/28/2022)   Humiliation, Afraid, Rape, and Kick questionnaire    Fear of Current or Ex-Partner: No    Emotionally Abused: No    Physically Abused: No    Sexually Abused: No    Family History:    Family History  Problem Relation Age of Onset   Stroke Mother    Rheum arthritis Sister      ROS:  Please see the history of present illness.  General:no colds or fevers, no weight changes Skin:no rashes or ulcers HEENT:no blurred vision, no congestion CV:see HPI PUL:see HPI GI:no diarrhea constipation or melena, no indigestion GU:no hematuria, no dysuria MS:no joint pain, no claudication Neuro:no syncope, no lightheadedness Endo:no diabetes, no thyroid disease  All other ROS reviewed and negative.     Physical Exam/Data:   Vitals:  02/10/23 0023 02/10/23 0024 02/10/23 0450 02/10/23 0751   BP:   (!) 146/57 (!) 159/58  Pulse: 63 66 64 67  Resp: 13 18 16 20   Temp:    97.7 F (36.5 C)  TempSrc:    Oral  SpO2: (!) 89% 93% 97% 97%  Weight:      Height:        Intake/Output Summary (Last 24 hours) at 02/10/2023 1113 Last data filed at 02/10/2023 0124 Gross per 24 hour  Intake --  Output 600 ml  Net -600 ml      02/08/2023    5:31 PM 02/08/2023   11:47 AM 11/12/2022    8:18 AM  Last 3 Weights  Weight (lbs) 145 lb 8.1 oz 145 lb 148 lb 6.4 oz  Weight (kg) 66 kg 65.772 kg 67.314 kg     Body mass index is 22.79 kg/m.  General:  Well nourished, well developed, in no acute distress HEENT: normal Neck: no JVD Vascular: No carotid bruits; Distal pulses 2+ bilaterally Cardiac:  normal S1, S2; RRR; no murmur gallup rub or click Lungs:  clear to auscultation bilaterally, no wheezing, rhonchi or rales  Abd: soft, nontender, no hepatomegaly  Ext: no edema in Rt leg ++ in left and leg wrapped Musculoskeletal:  No deformities, BUE and BLE strength normal and equal Skin: warm and dry  Neuro:  alert and oriented X 3 MAE follows commands no focal abnormalities noted Psych:  Normal affect     Relevant CV Studies: Echo 03/28/22  IMPRESSIONS     1. Left ventricular ejection fraction, by estimation, is 55 to 60%. The  left ventricle has normal function. The left ventricle has no regional  wall motion abnormalities. There is mild asymmetric left ventricular  hypertrophy of the basal segment. Left  ventricular diastolic parameters are consistent with Grade II diastolic  dysfunction (pseudonormalization). The average left ventricular global  longitudinal strain is -19.8 %. The global longitudinal strain is normal.   2. Right ventricular systolic function is normal. The right ventricular  size is normal. There is mildly elevated pulmonary artery systolic  pressure. The estimated right ventricular systolic pressure is 40.7 mmHg.   3. Left atrial size was mildly dilated.   4. The  mitral valve is grossly normal. Mild mitral valve regurgitation.   5. Tricuspid valve regurgitation is mild to moderate.   6. The aortic valve is tricuspid. Aortic valve regurgitation is not  visualized. Aortic valve sclerosis is present, with no evidence of aortic  valve stenosis.   7. The inferior vena cava is normal in size with greater than 50%  respiratory variability, suggesting right atrial pressure of 3 mmHg.   Comparison(s): No prior Echocardiogram.   FINDINGS   Left Ventricle: Left ventricular ejection fraction, by estimation, is 55  to 60%. The left ventricle has normal function. The left ventricle has no  regional wall motion abnormalities. The average left ventricular global  longitudinal strain is -19.8 %.  The global longitudinal strain is normal. The left ventricular internal  cavity size was normal in size. There is mild asymmetric left ventricular  hypertrophy of the basal segment. Left ventricular diastolic parameters  are consistent with Grade II  diastolic dysfunction (pseudonormalization).   Right Ventricle: The right ventricular size is normal. No increase in  right ventricular wall thickness. Right ventricular systolic function is  normal. There is mildly elevated pulmonary artery systolic pressure. The  tricuspid regurgitant velocity is 3.07   m/s, and with an  assumed right atrial pressure of 3 mmHg, the estimated  right ventricular systolic pressure is 40.7 mmHg.   Left Atrium: Left atrial size was mildly dilated.   Right Atrium: Right atrial size was normal in size.   Pericardium: There is no evidence of pericardial effusion.   Mitral Valve: The mitral valve is grossly normal. Mild mitral valve  regurgitation.   Tricuspid Valve: The tricuspid valve is grossly normal. Tricuspid valve  regurgitation is mild to moderate.   Aortic Valve: The aortic valve is tricuspid. There is mild aortic valve  annular calcification. Aortic valve regurgitation is not  visualized.  Aortic valve sclerosis is present, with no evidence of aortic valve  stenosis. Aortic valve peak gradient measures   7.2 mmHg.   Pulmonic Valve: The pulmonic valve was grossly normal. Pulmonic valve  regurgitation is trivial.   Aorta: The aortic root is normal in size and structure.   Venous: The inferior vena cava is normal in size with greater than 50%  respiratory variability, suggesting right atrial pressure of 3 mmHg.   IAS/Shunts: No atrial level shunt detected by color flow Doppler.    Laboratory Data:  High Sensitivity Troponin:  No results for input(s): "TROPONINIHS" in the last 720 hours.   Chemistry Recent Labs  Lab 02/08/23 1414 02/09/23 0229 02/10/23 0410  NA 131* 130* 131*  K 4.5 4.4 4.5  CL 98 100 100  CO2 23 22 20*  GLUCOSE 119* 108* 100*  BUN 21 20 15   CREATININE 1.54* 0.99 0.87  CALCIUM 9.2 8.5* 8.7*  MG  --   --  1.9  GFRNONAA 33* 56* >60  ANIONGAP 10 8 11     Recent Labs  Lab 02/08/23 1414 02/10/23 0410  PROT 7.6 6.4*  ALBUMIN 4.0 3.0*  AST 21 16  ALT 12 11  ALKPHOS 76 63  BILITOT 0.7 0.6   Lipids No results for input(s): "CHOL", "TRIG", "HDL", "LABVLDL", "LDLCALC", "CHOLHDL" in the last 168 hours.  Hematology Recent Labs  Lab 02/08/23 1414 02/09/23 0229 02/10/23 0410  WBC 12.7* 10.0 9.4  RBC 4.08 3.52* 3.84*  HGB 11.6* 9.9* 10.9*  HCT 36.0 31.3* 34.3*  MCV 88.2 88.9 89.3  MCH 28.4 28.1 28.4  MCHC 32.2 31.6 31.8  RDW 14.3 14.5 14.6  PLT 462* 364 400   Thyroid No results for input(s): "TSH", "FREET4" in the last 168 hours.  BNPNo results for input(s): "BNP", "PROBNP" in the last 168 hours.  DDimer No results for input(s): "DDIMER" in the last 168 hours.   Radiology/Studies:  CT Knee Left Wo Contrast  Result Date: 02/08/2023 CLINICAL DATA:  Septic arthritis. EXAM: CT OF THE LEFT KNEE WITHOUT CONTRAST TECHNIQUE: Multidetector CT imaging of the left knee was performed according to the standard protocol. Multiplanar  CT image reconstructions were also generated. RADIATION DOSE REDUCTION: This exam was performed according to the departmental dose-optimization program which includes automated exposure control, adjustment of the mA and/or kV according to patient size and/or use of iterative reconstruction technique. COMPARISON:  Knee x-ray 02/08/2023, 02/04/2023 FINDINGS: Bones/Joint/Cartilage Generalized osteopenia. No fracture or dislocation. Normal alignment. Chondrocalcinosis of the medial and lateral femorotibial compartments as can be seen with CPPD. Moderate medial femorotibial compartment joint space narrowing. Mild lateral femorotibial compartment joint space narrowing. Mild patellofemoral compartment joint space narrowing. Small knee joint effusion. Small Baker's cyst Ligaments Ligaments are suboptimally evaluated by CT. Muscles and Tendons Atrophy of the visualized portion of the distal long head of the biceps femoris muscle and semimembranosus  muscle. No intramuscular fluid collection or hematoma. Soft tissue Hyperdense fluid collection superficial to the patellar tendon in the anterior subcutaneous fat measuring 6.2 x 6.5 x 2 cm most consistent with a hematoma/hemorrhagic bursitis. No soft tissue mass. IMPRESSION: 1. Hyperdense fluid collection superficial to the patellar tendon in the anterior subcutaneous fat measuring 6.2 x 6.5 x 2 cm most consistent with a hematoma/hemorrhagic bursitis. 2. No acute osseous injury of the left knee. 3. Tricompartmental osteoarthritis of the left knee. Electronically Signed   By: Elige Ko M.D.   On: 02/08/2023 14:37   DG Knee Complete 4 Views Left  Result Date: 02/08/2023 CLINICAL DATA:  Patient fell 1 week ago. Wound on the anterior portion of the knee. Blistering and redness. EXAM: LEFT KNEE - COMPLETE 4+ VIEW COMPARISON:  None Available. FINDINGS: Bones are diffusely demineralized. No evidence for an acute fracture or dislocation. Loss of joint space noted lateral  compartment with subchondral sclerosis. Trace spurring noted patellofemoral compartment. Small joint effusion. Subtle cortical off step noted tibial tuberosity with some lucency of the tuberosity compared to background metaphyseal mineralization. IMPRESSION: 1. No acute bony traumatic abnormality. Subtle lucency anterior tibial tuberosity with associated subtle cortical off step. There does appear to be some soft tissue swelling overlying this region. If there is clinical concern for osteomyelitis, MRI with and without contrast would be the study of choice to further evaluate. 2. Degenerative changes in the lateral and patellofemoral compartments. 3. Small joint effusion. Electronically Signed   By: Kennith Center M.D.   On: 02/08/2023 12:45     Assessment and Plan:   PA flutter vs SR with bursts PAT  hx of PAF, has been maintaining SR until now - Eliquis has been stopped due to bleed.  She is on amiodarone 100 would continue and continue BB at lower dose for now  chads Vasc is 6 but with acute hematoma of knee will need to hold eliquis. Did have chest pain with the rapid HR.  No acute ST changes but LBBB  Most likely to need PPM in future- will have her seen by EP after discharge. 4 sec pause not conversion pause it does not seem though may have been rate controlled a flutter to SR vs tachy/brady syndrome  see above  Left knee hematoma/hemorrhagic bursitis has been aspirated followed by ortho  Anemia/hyponatremia/mild AKI/HTN per IM Chronic bilateral neck pain and chronic left sided lower back pain radiating down to leg -- plan for injection.    Risk Assessment/Risk Scores:          CHA2DS2-VASc Score = 6   This indicates a 9.7% annual risk of stroke. The patient's score is based upon: CHF History: 1 HTN History: 1 Diabetes History: 0 Stroke History: 0 Vascular Disease History: 1 Age Score: 2 Gender Score: 1         For questions or updates, please contact [facility] Please consult [email or web address] for contact info under    Signed, Nada Boozer, NP  02/10/2023 11:13 AM  I have seen and examined the patient along with Nada Boozer, NP .  I have reviewed the chart, notes and new data.  I agree with NP's note.  Key new complaints: Currently without any cardiovascular complaints.  Had chest discomfort during tachycardia.  Appears to be unaware of the pauses. Key examination changes: Currently regular rate and rhythm, paradoxically split second heart sound. Key new findings / data: Reviewed the ECGs and rhythm strips.  There have been periods  of atrial flutter with variable AV block.  On 02/09/2023 at 12:28 PM there is an episode of regular tachycardia 180 bpm that could represent atrial flutter with 2: 1 block or ectopic atrial tachycardia.  At that time the patient was symptomatic (chest tightness).  The rhythm strips also history of recurrent episodes of sinus pause lasting for up to 4.6 seconds.  These do not occur as postconversion pauses, but appear to occur randomly and spontaneously   PLAN: The patient clearly has evidence of both rapid atrial arrhythmia and periods of sinus node pauses/sinus arrest as well as intraventricular conduction abnormalities (left bundle branch block). It is quite likely that the episodes of rapid atrial arrhythmia are related to the acute illness/hyperadrenergic state.  She had atrial fibrillation in 2019 during a similar situation of acute infection. The episodes of bradycardia are not symptomatic and pacemaker implantation is not currently necessary.  In fact pacemaker implantation should be delayed until skin infection is well-controlled to avoid the risk of device contamination. Cut back the metoprolol to half of previous dose and we can reduce this further if needed. It is quite likely that she will eventually require pacemaker implantation, but this is not imminent.  Can discuss options for ablation of atrial  flutter/atrial fibrillation with Dr. Nelly Laurence at follow-up appointment.  Thurmon Fair, MD, [facility] [phone] 02/10/2023, 2:05 PM

## 2023-02-10 NOTE — Progress Notes (Signed)
Orthopedic Surgery Progress Note   Assessment: Patient is a 87 y.o. female with left knee pain, seems consistent with septic prepatellar bursitis versus cellulitis. No pain with passive knee range of motion, so low concern for septic joint   Plan: -Operative plans: none at this time -Aspiration negative for gram stain and culture no growth to date -DVT ppx: per primary -Recommend empiric antibiotic treatment -Weight bearing status: as tolerated -PT evaluate and treat -Pain control -Dispo: pending clinical improvement  ___________________________________________________________________________  Subjective: Pain well controlled. States that her knee pain is slightly better than yesterday. Feels icing has been helpful. No other joints hurting besides the left knee.    Physical Exam:  General: no acute distress, appears stated age Neurologic: alert, answering questions appropriately, following commands Respiratory: unlabored breathing on room air, symmetric chest rise Psychiatric: appropriate affect, normal cadence to speech  MSK:   -Left lower extremity  TTP over the anterior knee. Ecchymosis around the anterior and posterior aspect of the knee is unchanged from yesterday. Erythema has slightly improved from yesterday and is no longer abutting the area demarcated yesterday especially inferiorly. No active drainage. Small skin blister noted over the anterior knee             No pain through passive knee range of motion EHL/TA/GSC intact Plantarflexes and dorsiflexes toes Sensation intact to light touch in sural, saphenous, tibial, deep peroneal, and superficial peroneal nerve distributions Foot warm and well perfused   ESR and CRP that I ordered on 02/09/2023 were reviewed today and were 40 and 3.3, respectively.   Patient name: Lydia Graham Patient MRN: [id number] Date: 02/10/23

## 2023-02-10 NOTE — Progress Notes (Signed)
PROGRESS NOTE  Lydia Graham VWU:[id number] DOB: 11/03/1935 DOA: 02/08/2023 PCP: [name], FNP   LOS: 1 day   Brief Narrative / Interim history: 87 y.o. female with medical history significant of paroxysmal A-fib on Eliquis, hypertension, GERD, carotid stenosis status post left carotid endarterectomy in 2018 presenting with complaint of left knee pain, swelling, and bruising since after a fall a week ago.  Patient states she was seen by orthopedics and had an x-ray done which did not show a fracture but she was told there could be some bleeding in her knee as she is on Eliquis.  States she was given a steroid injection at that time.  She went to urgent care on the day of admission, and was directed to the ER.  CT of the knee showed hyperdense fluid collection superficial to the patellar tendon measuring 6.2 x 6.5 x 2 cm, consistent with hematoma/hemorrhagic bursitis.  There was concern about infection also, she was started on antibiotics and admitted to the hospital  Subjective / 24h Interval events: Knee pain better  Assesement and Plan: Principal Problem:   Bursitis Active Problems:   PAF (paroxysmal atrial fibrillation) (HCC)   AKI (acute kidney injury) (HCC)   Hyponatremia   Essential hypertension   Normocytic anemia   Knee pain   Cellulitis   Principal problem Left knee hematoma, hemorrhagic bursitis -with concern for infection initially.  Mild leukocytosis noted.  Has been placed on antibiotics, continue -Discussed with Dr. Christell Constant with orthopedics, status post knee aspiration 8/18.  WBC could not be counted due to clotting of the sample.  Cultures negative to date  Active problems PAF, SVT, sinus pause-hold Eliquis for now.  Continue metoprolol.  Yesterday patient had an episode of SVT versus a flutter with rates into the 170s-180s, responded well to IV metoprolol, however had a 4.6-second pause -I have asked cardiology to weigh in, appreciate input  Essential  hypertension-continue home medications  Mild AKI -creatinine normalized with fluids  Hyponatremia-stable  Anemia -hemoglobin stable  Scheduled Meds:  amiodarone  100 mg Oral QODAY   amLODipine  5 mg Oral BID   enoxaparin (LOVENOX) injection  40 mg Subcutaneous Q24H   metoprolol succinate  50 mg Oral Daily   polyethylene glycol  17 g Oral Daily   Continuous Infusions:  cefTRIAXone (ROCEPHIN)  IV 1 g (02/09/23 1139)   vancomycin 500 mg (02/09/23 1808)   PRN Meds:.acetaminophen, metoprolol tartrate, morphine injection, naLOXone (NARCAN)  injection, oxyCODONE  Current Outpatient Medications  Medication Instructions   amiodarone (PACERONE) 100 mg, Oral, Every other day   amLODipine (NORVASC) 5 MG tablet TAKE 1 TABLET AT 8AM AND 1 TABLET AT 8PM.   apixaban (ELIQUIS) 5 mg, Oral, 2 times daily   Calcium Carbonate-Vitamin D (CALCIUM 600+D PO) 1 tablet, Oral, Daily   Camphor-Menthol-Methyl Sal (SALONPAS) 3.06-29-08 % PTCH 1 patch, Transdermal, At bedtime PRN   cyanocobalamin (VITAMIN B12) 1,000 mcg, Oral, Daily   estradiol (ESTRACE) 0.5 mg, Oral, Daily   furosemide (LASIX) 20 mg, Oral, As needed   Hypromellose (ARTIFICIAL TEARS OP) 2 drops, Both Eyes, 2 times daily PRN   losartan (COZAAR) 50 MG tablet take 1 tablet at 10 IN THE MORNING and 1 tablet at 10 pm.   magnesium hydroxide (MILK OF MAGNESIA) 400 MG/5ML suspension 30 mLs, Oral, Daily PRN   metoprolol succinate (TOPROL-XL) 50 MG 24 hr tablet take 1 tablet at lunch or immediately following a meal.   pantoprazole (PROTONIX) 40 MG tablet TAKE (1) TABLET  TWICE DAILY.    Diet Orders (From admission, onward)     Start     Ordered   02/09/23 1028  Diet regular Room service appropriate? No; Fluid consistency: Thin  Diet effective now       Question Answer Comment  Room service appropriate? No   Fluid consistency: Thin      02/09/23 1028            DVT prophylaxis: enoxaparin (LOVENOX) injection 40 mg Start: 02/09/23 1215 SCDs  Start: 02/08/23 2004   Lab Results  Component Value Date   PLT 400 02/10/2023      Code Status: Full Code  Family Communication: no family at bedside   Status is: Inpatient   Level of care: Telemetry Medical  Consultants:  Orthopedic surgery  Objective: Vitals:   02/10/23 0023 02/10/23 0024 02/10/23 0450 02/10/23 0751  BP:   (!) 146/57 (!) 159/58  Pulse: 63 66 64 67  Resp: 13 18 16 20   Temp:    97.7 F (36.5 C)  TempSrc:    Oral  SpO2: (!) 89% 93% 97% 97%  Weight:      Height:        Intake/Output Summary (Last 24 hours) at 02/10/2023 1033 Last data filed at 02/10/2023 0124 Gross per 24 hour  Intake --  Output 600 ml  Net -600 ml   Wt Readings from Last 3 Encounters:  02/08/23 66 kg  02/08/23 65.8 kg  11/12/22 67.3 kg    Examination:  Constitutional: NAD Eyes: lids and conjunctivae normal, no scleral icterus ENMT: mmm Neck: normal, supple Respiratory: clear to auscultation bilaterally, no wheezing, no crackles.  Cardiovascular: Regular rate and rhythm, no murmurs / rubs / gallops. No LE edema. Abdomen: soft, no distention, no tenderness. Bowel sounds positive.   Data Reviewed: I have independently reviewed following labs and imaging studies   CBC Recent Labs  Lab 02/08/23 1414 02/09/23 0229 02/10/23 0410  WBC 12.7* 10.0 9.4  HGB 11.6* 9.9* 10.9*  HCT 36.0 31.3* 34.3*  PLT 462* 364 400  MCV 88.2 88.9 89.3  MCH 28.4 28.1 28.4  MCHC 32.2 31.6 31.8  RDW 14.3 14.5 14.6  LYMPHSABS 1.6  --   --   MONOABS 1.1*  --   --   EOSABS 0.1  --   --   BASOSABS 0.1  --   --     Recent Labs  Lab 02/08/23 1414 02/08/23 1418 02/09/23 0229 02/09/23 1009 02/10/23 0410  NA 131*  --  130*  --  131*  K 4.5  --  4.4  --  4.5  CL 98  --  100  --  100  CO2 23  --  22  --  20*  GLUCOSE 119*  --  108*  --  100*  BUN 21  --  20  --  15  CREATININE 1.54*  --  0.99  --  0.87  CALCIUM 9.2  --  8.5*  --  8.7*  AST 21  --   --   --  16  ALT 12  --   --   --  11   ALKPHOS 76  --   --   --  63  BILITOT 0.7  --   --   --  0.6  ALBUMIN 4.0  --   --   --  3.0*  MG  --   --   --   --  1.9  CRP  --   --   --  3.3*  --   LATICACIDVEN  --  1.1  --   --   --     ------------------------------------------------------------------------------------------------------------------ No results for input(s): "CHOL", "HDL", "LDLCALC", "TRIG", "CHOLHDL", "LDLDIRECT" in the last 72 hours.  Lab Results  Component Value Date   HGBA1C 5.6 09/06/2022   ------------------------------------------------------------------------------------------------------------------ No results for input(s): "TSH", "T4TOTAL", "T3FREE", "THYROIDAB" in the last 72 hours.  Invalid input(s): "FREET3"  Cardiac Enzymes No results for input(s): "CKMB", "TROPONINI", "MYOGLOBIN" in the last 168 hours.  Invalid input(s): "CK" ------------------------------------------------------------------------------------------------------------------    Component Value Date/Time   BNP 258.0 (H) 01/02/2022 0546    CBG: No results for input(s): "GLUCAP" in the last 168 hours.  Recent Results (from the past 240 hour(s))  Blood culture (routine x 2)     Status: None (Preliminary result)   Collection Time: 02/08/23  5:12 PM   Specimen: BLOOD  Result Value Ref Range Status   Specimen Description BLOOD RIGHT ANTECUBITAL  Final   Special Requests   Final    BOTTLES DRAWN AEROBIC AND ANAEROBIC Blood Culture adequate volume   Culture   Final    NO GROWTH 2 DAYS Performed at [facility], [address], [place], [place] [address]    Report Status PENDING  Incomplete  Blood culture (routine x 2)     Status: None (Preliminary result)   Collection Time: 02/08/23  5:27 PM   Specimen: BLOOD  Result Value Ref Range Status   Specimen Description BLOOD BLOOD LEFT FOREARM  Final   Special Requests   Final    BOTTLES DRAWN AEROBIC AND ANAEROBIC Blood Culture adequate volume   Culture   Final    NO  GROWTH 2 DAYS Performed at [facility], [address], [place], [place] [address]    Report Status PENDING  Incomplete  Body fluid culture w Gram Stain     Status: None (Preliminary result)   Collection Time: 02/09/23  9:36 AM   Specimen: Path fluid; Body Fluid  Result Value Ref Range Status   Specimen Description FLUID  Final   Special Requests BURSA, SYNOVIAL CYST  Final   Gram Stain   Final    RARE WBC PRESENT, PREDOMINANTLY PMN NO ORGANISMS SEEN Performed at [facility], [address], [place], [place] [address]    Culture PENDING  Incomplete   Report Status PENDING  Incomplete     Radiology Studies: No results found.   Pamella Pert, MD, PhD [facility]  Between 7 am - 7 pm I am available, please contact me via Amion (for emergencies) or Securechat (non urgent messages)  Between 7 pm - 7 am I am not available, please contact night coverage MD/APP via Amion

## 2023-02-11 DIAGNOSIS — M7042 Prepatellar bursitis, left knee: Secondary | ICD-10-CM | POA: Diagnosis not present

## 2023-02-11 DIAGNOSIS — I455 Other specified heart block: Secondary | ICD-10-CM

## 2023-02-11 DIAGNOSIS — I447 Left bundle-branch block, unspecified: Secondary | ICD-10-CM | POA: Diagnosis not present

## 2023-02-11 DIAGNOSIS — I480 Paroxysmal atrial fibrillation: Secondary | ICD-10-CM | POA: Diagnosis not present

## 2023-02-11 DIAGNOSIS — L0390 Cellulitis, unspecified: Secondary | ICD-10-CM | POA: Diagnosis not present

## 2023-02-11 MED ORDER — LOSARTAN POTASSIUM 50 MG PO TABS
50.0000 mg | ORAL_TABLET | Freq: Two times a day (BID) | ORAL | Status: DC
  Administered 2023-02-11 – 2023-02-12 (×3): 50 mg via ORAL
  Filled 2023-02-11 (×3): qty 1

## 2023-02-11 NOTE — Progress Notes (Addendum)
PROGRESS NOTE  HEE KHA JYN:[id number] DOB: 04-May-1936 DOA: 02/08/2023 PCP: [name], FNP   LOS: 2 days   Brief Narrative / Interim history: 87 y.o. female with medical history significant of paroxysmal A-fib on Eliquis, hypertension, GERD, carotid stenosis status post left carotid endarterectomy in 2018 presenting with complaint of left knee pain, swelling, and bruising since after a fall a week ago.  Patient states she was seen by orthopedics and had an x-ray done which did not show a fracture but she was told there could be some bleeding in her knee as she is on Eliquis.  States she was given a steroid injection at that time.  She went to urgent care on the day of admission, and was directed to the ER.  CT of the knee showed hyperdense fluid collection superficial to the patellar tendon measuring 6.2 x 6.5 x 2 cm, consistent with hematoma/hemorrhagic bursitis.  There was concern about infection also, she was started on antibiotics and admitted to the hospital  Subjective / 24h Interval events: Overall feeling better.  Is able to ambulate a little bit more  Assesement and Plan: Principal Problem:   Bursitis Active Problems:   PAF (paroxysmal atrial fibrillation) (HCC)   AKI (acute kidney injury) (HCC)   Hyponatremia   Essential hypertension   Normocytic anemia   Knee pain   Cellulitis   Sinus pause   LBBB (left bundle branch block)   Principal problem Left knee hematoma/cellulitis, hemorrhagic bursitis -with concern for infection initially.  Mild leukocytosis noted.  Has been placed on antibiotics, orthopedic surgery favors continuing antibiotics for now -Discussed with Dr. Christell Constant with orthopedics, status post knee aspiration 8/18.  WBC could not be counted in the aspirated fluid due to clotting of the sample.  Cultures negative, today day #2.  If they remain negative for another day or so could potentially switch to oral antibiotics for the cellulitic component  alone  Active problems PAF, SVT, sinus pause-hold Eliquis for now.  Continue metoprolol.  Over the weekend patient had an episode of SVT versus a flutter with rates into the 170s-180s, responded well to IV metoprolol, however had a 4.6-second pause -Cardiology consulted, signed off today.  Continue amiodarone, lower dose metoprolol, losartan  Essential hypertension-continue home medications, blood pressure has been overall stable  Mild AKI -creatinine normalized with fluids  Hyponatremia-stable  Anemia -hemoglobin stable  Scheduled Meds:  amiodarone  100 mg Oral QODAY   amLODipine  5 mg Oral BID   enoxaparin (LOVENOX) injection  40 mg Subcutaneous Q24H   losartan  50 mg Oral BID   metoprolol succinate  25 mg Oral Daily   polyethylene glycol  17 g Oral Daily   Continuous Infusions:  cefTRIAXone (ROCEPHIN)  IV 1 g (02/11/23 1243)   vancomycin Stopped (02/10/23 1952)   PRN Meds:.acetaminophen, metoprolol tartrate, morphine injection, naLOXone (NARCAN)  injection, oxyCODONE  Current Outpatient Medications  Medication Instructions   amiodarone (PACERONE) 100 mg, Oral, Every other day   amLODipine (NORVASC) 5 MG tablet TAKE 1 TABLET AT 8AM AND 1 TABLET AT 8PM.   apixaban (ELIQUIS) 5 mg, Oral, 2 times daily   Calcium Carb-Cholecalciferol (CALCIUM + VITAMIN D3 PO) 1 tablet, Oral, Daily   Camphor-Menthol-Methyl Sal (SALONPAS) 3.06-29-08 % PTCH 1 patch, Transdermal, At bedtime PRN   Cyanocobalamin (VITAMIN B-12 PO) 1 tablet, Oral, Daily   estradiol (ESTRACE) 0.5 mg, Oral, Daily   furosemide (LASIX) 20 mg, Oral, As needed   Hypromellose (ARTIFICIAL TEARS OP) 2 drops,  Both Eyes, 2 times daily PRN   losartan (COZAAR) 50 MG tablet take 1 tablet at 10 IN THE MORNING and 1 tablet at 10 pm.   MAGNESIUM PO 1 capsule, Oral, Every evening   metoprolol succinate (TOPROL-XL) 50 MG 24 hr tablet take 1 tablet at lunch or immediately following a meal.   pantoprazole (PROTONIX) 40 MG tablet TAKE (1)  TABLET TWICE DAILY.    Diet Orders (From admission, onward)     Start     Ordered   02/09/23 1028  Diet regular Room service appropriate? No; Fluid consistency: Thin  Diet effective now       Question Answer Comment  Room service appropriate? No   Fluid consistency: Thin      02/09/23 1028            DVT prophylaxis: enoxaparin (LOVENOX) injection 40 mg Start: 02/09/23 1215 SCDs Start: 02/08/23 2004   Lab Results  Component Value Date   PLT 400 02/10/2023      Code Status: Full Code  Family Communication: no family at bedside   Status is: Inpatient   Level of care: Telemetry Medical  Consultants:  Orthopedic surgery  Objective: Vitals:   02/10/23 1447 02/10/23 1945 02/11/23 0447 02/11/23 0735  BP: (!) 130/57 (!) 141/56 (!) 158/57 (!) 156/62  Pulse: 67 72 69 75  Resp:  16 16 20   Temp: 98.1 F (36.7 C) 98.8 F (37.1 C)  97.8 F (36.6 C)  TempSrc: Oral Oral  Oral  SpO2: 96% 95% 90% 93%  Weight:      Height:        Intake/Output Summary (Last 24 hours) at 02/11/2023 1306 Last data filed at 02/11/2023 0800 Gross per 24 hour  Intake 1240 ml  Output --  Net 1240 ml   Wt Readings from Last 3 Encounters:  02/08/23 66 kg  02/08/23 65.8 kg  11/12/22 67.3 kg    Examination:  Constitutional: NAD Eyes: lids and conjunctivae normal, no scleral icterus ENMT: mmm Neck: normal, supple Respiratory: clear to auscultation bilaterally, no wheezing, no crackles.  Cardiovascular: Regular rate and rhythm, no murmurs / rubs / gallops. No LE edema. Abdomen: soft, no distention, no tenderness. Bowel sounds positive.   Data Reviewed: I have independently reviewed following labs and imaging studies   CBC Recent Labs  Lab 02/08/23 1414 02/09/23 0229 02/10/23 0410  WBC 12.7* 10.0 9.4  HGB 11.6* 9.9* 10.9*  HCT 36.0 31.3* 34.3*  PLT 462* 364 400  MCV 88.2 88.9 89.3  MCH 28.4 28.1 28.4  MCHC 32.2 31.6 31.8  RDW 14.3 14.5 14.6  LYMPHSABS 1.6  --   --    MONOABS 1.1*  --   --   EOSABS 0.1  --   --   BASOSABS 0.1  --   --     Recent Labs  Lab 02/08/23 1414 02/08/23 1418 02/09/23 0229 02/09/23 1009 02/10/23 0410  NA 131*  --  130*  --  131*  K 4.5  --  4.4  --  4.5  CL 98  --  100  --  100  CO2 23  --  22  --  20*  GLUCOSE 119*  --  108*  --  100*  BUN 21  --  20  --  15  CREATININE 1.54*  --  0.99  --  0.87  CALCIUM 9.2  --  8.5*  --  8.7*  AST 21  --   --   --  16  ALT 12  --   --   --  11  ALKPHOS 76  --   --   --  63  BILITOT 0.7  --   --   --  0.6  ALBUMIN 4.0  --   --   --  3.0*  MG  --   --   --   --  1.9  CRP  --   --   --  3.3*  --   LATICACIDVEN  --  1.1  --   --   --     ------------------------------------------------------------------------------------------------------------------ No results for input(s): "CHOL", "HDL", "LDLCALC", "TRIG", "CHOLHDL", "LDLDIRECT" in the last 72 hours.  Lab Results  Component Value Date   HGBA1C 5.6 09/06/2022   ------------------------------------------------------------------------------------------------------------------ No results for input(s): "TSH", "T4TOTAL", "T3FREE", "THYROIDAB" in the last 72 hours.  Invalid input(s): "FREET3"  Cardiac Enzymes No results for input(s): "CKMB", "TROPONINI", "MYOGLOBIN" in the last 168 hours.  Invalid input(s): "CK" ------------------------------------------------------------------------------------------------------------------    Component Value Date/Time   BNP 258.0 (H) 01/02/2022 0546    CBG: No results for input(s): "GLUCAP" in the last 168 hours.  Recent Results (from the past 240 hour(s))  Blood culture (routine x 2)     Status: None (Preliminary result)   Collection Time: 02/08/23  5:12 PM   Specimen: BLOOD  Result Value Ref Range Status   Specimen Description BLOOD RIGHT ANTECUBITAL  Final   Special Requests   Final    BOTTLES DRAWN AEROBIC AND ANAEROBIC Blood Culture adequate volume   Culture   Final    NO  GROWTH 3 DAYS Performed at [facility], [address], [place], [place] [address]    Report Status PENDING  Incomplete  Blood culture (routine x 2)     Status: None (Preliminary result)   Collection Time: 02/08/23  5:27 PM   Specimen: BLOOD  Result Value Ref Range Status   Specimen Description BLOOD BLOOD LEFT FOREARM  Final   Special Requests   Final    BOTTLES DRAWN AEROBIC AND ANAEROBIC Blood Culture adequate volume   Culture   Final    NO GROWTH 3 DAYS Performed at [facility], [address], [place], [place] [address]    Report Status PENDING  Incomplete  Body fluid culture w Gram Stain     Status: None (Preliminary result)   Collection Time: 02/09/23  9:36 AM   Specimen: Path fluid; Body Fluid  Result Value Ref Range Status   Specimen Description FLUID  Final   Special Requests BURSA, SYNOVIAL CYST  Final   Gram Stain   Final    RARE WBC PRESENT, PREDOMINANTLY PMN NO ORGANISMS SEEN    Culture   Final    NO GROWTH 2 DAYS Performed at [facility], [address] [place], [place] [address]    Report Status PENDING  Incomplete     Radiology Studies: No results found.   Pamella Pert, MD, PhD [facility]  Between 7 am - 7 pm I am available, please contact me via Amion (for emergencies) or Securechat (non urgent messages)  Between 7 pm - 7 am I am not available, please contact night coverage MD/APP via Amion

## 2023-02-11 NOTE — Plan of Care (Signed)
  Problem: Clinical Measurements: Goal: Ability to avoid or minimize complications of infection will improve Outcome: Progressing   Problem: Skin Integrity: Goal: Skin integrity will improve Outcome: Progressing   Problem: Education: Goal: Knowledge of General Education information will improve Description: Including pain rating scale, medication(s)/side effects and non-pharmacologic comfort measures Outcome: Progressing   

## 2023-02-11 NOTE — Evaluation (Signed)
Physical Therapy Evaluation & Discharge Patient Details Name: Lydia Graham MRN: [id number] DOB: 26-Nov-1935 Today's Date: 02/11/2023  History of Present Illness  Pt is an 87 y.o. female admitted 02/08/23 with L knee pain and swelling after fall 1 wk prior. Knee CT consistent with hematoma/hemorrhagic bursitis, concern for infection. S/p L knee aspiration 8/18. PMH includes afib on Eliquis, HTN, GERD, s/p L CEA (2018).   Clinical Impression  Patient evaluated by Physical Therapy with no further acute PT needs identified. PTA, pt mod indep with SPC, lives alone, drives, remains active in community. Today, pt ambulatory with SPC, progressing to supervision-level mobility. Educ re: activity recommendations, edema control, LE AROM/therex, fall risk reduction, DME recs. All education has been completed and the patient has no further questions. Pt reports daughter plans to stay with her at d/c to provide initial support. Acute PT is signing off. Thank you for this referral.      If plan is discharge home, recommend the following: A little help with bathing/dressing/bathroom;Assistance with cooking/housework;Assist for transportation;Help with stairs or ramp for entrance   Can travel by private vehicle    Yes    Equipment Recommendations None recommended by PT  Recommendations for Other Services       Functional Status Assessment       Precautions / Restrictions Precautions Precautions: Fall Restrictions Weight Bearing Restrictions: No      Mobility  Bed Mobility Overal bed mobility: Modified Independent                  Transfers Overall transfer level: Needs assistance Equipment used: Straight cane Transfers: Sit to/from Stand Sit to Stand: Supervision                Ambulation/Gait Ambulation/Gait assistance: Contact guard assist, Supervision Gait Distance (Feet): 200 Feet Assistive device: Straight cane Gait Pattern/deviations: Step-through pattern,  Decreased stride length, Trunk flexed, Antalgic Gait velocity: Decreased     General Gait Details: slow, antalgic gait with SPC and initial CGA progressing to supervision. pt with preference to hold cane in LUE (as she has done for years), not interested in holding with RUE to offload painful L knee  Stairs            Wheelchair Mobility     Tilt Bed    Modified Rankin (Stroke Patients Only)       Balance Overall balance assessment: Needs assistance Sitting-balance support: No upper extremity supported, Feet supported, Feet unsupported Sitting balance-Leahy Scale: Good Sitting balance - Comments: indep to doff/don bilateral socks sitting EOB via figure four   Standing balance support: No upper extremity supported, During functional activity Standing balance-Leahy Scale: Fair Standing balance comment: can stand and take steps without UE; static and dynamic stability improved with SPC                             Pertinent Vitals/Pain Pain Assessment Pain Assessment: Faces Faces Pain Scale: Hurts a little bit Pain Location: L knee Pain Descriptors / Indicators: Discomfort, Sore Pain Intervention(s): Monitored during session, Premedicated before session    Home Living Family/patient expects to be discharged to:: Private residence Living Arrangements: Alone Available Help at Discharge: Family;Available PRN/intermittently;Friend(s);Neighbor Type of Home: House Home Access: Stairs to enter Entrance Stairs-Rails: Left Entrance Stairs-Number of Steps: 2   Home Layout: One level Home Equipment: Agricultural consultant (2 wheels);Cane - single point;Shower seat;Grab bars - tub/shower Additional Comments: daughter lives 2 hrs away, but  plans to stay with pt a few days to assist as needed per pt    Prior Function Prior Level of Function : Independent/Modified Independent;Driving             Mobility Comments: mod indep with SPC for household and community  ambulation. drives. stays active with church, art classes and going out with friends ADLs Comments: mod indep with ADL and iADLs     Extremity/Trunk Assessment   Upper Extremity Assessment Upper Extremity Assessment: Overall WFL for tasks assessed    Lower Extremity Assessment Lower Extremity Assessment: LLE deficits/detail LLE Deficits / Details: L knee bursitis s/p aspiration; gross hip and knee strength >/ 4/5       Communication   Communication Communication: No apparent difficulties  Cognition Arousal: Alert Behavior During Therapy: WFL for tasks assessed/performed Overall Cognitive Status: Within Functional Limits for tasks assessed                                          General Comments General comments (skin integrity, edema, etc.): educ re: POC, activity recommendations, fall risk reduction, DME needs, LLE AROM. discussed recommendation for initial RW for added stability, but pt preference to keep using her SPC. noted swelling in lower leg at distal end of ace wrap; rewrapped L knee to hopefully alleviate this    Exercises Other Exercises Other Exercises: seated L knee flex/ext, seated marching, supine knee to chest   Assessment/Plan    PT Assessment Patient does not need any further PT services  PT Problem List         PT Treatment Interventions      PT Goals (Current goals can be found in the Care Plan section)  Acute Rehab PT Goals PT Goal Formulation: All assessment and education complete, DC therapy    Frequency       Co-evaluation               AM-PAC PT "6 Clicks" Mobility  Outcome Measure Help needed turning from your back to your side while in a flat bed without using bedrails?: None Help needed moving from lying on your back to sitting on the side of a flat bed without using bedrails?: None Help needed moving to and from a bed to a chair (including a wheelchair)?: A Little Help needed standing up from a chair using  your arms (e.g., wheelchair or bedside chair)?: A Little Help needed to walk in hospital room?: A Little Help needed climbing 3-5 steps with a railing? : A Little 6 Click Score: 20    End of Session Equipment Utilized During Treatment: Gait belt Activity Tolerance: Patient tolerated treatment well Patient left: in bed;with call bell/phone within reach Nurse Communication: Mobility status PT Visit Diagnosis: Other abnormalities of gait and mobility (R26.89);Pain Pain - Right/Left: Left Pain - part of body: Knee    Time: 4166-0630 PT Time Calculation (min) (ACUTE ONLY): 22 min   Charges:   PT Evaluation $PT Eval Low Complexity: 1 Low   PT General Charges $$ ACUTE PT VISIT: 1 Visit       Ina Homes, PT, DPT Acute Rehabilitation Services  Personal: Secure Chat Rehab Office: [phone]  Malachy Chamber 02/11/2023, 3:53 PM

## 2023-02-11 NOTE — Progress Notes (Addendum)
Patient Name: Lydia Graham Date of Encounter: 02/11/2023 [facility] Cardiologist: [name], MD   Interval Summary  .    87 y.o. female with a hx of PAF, on amio, eliquis, chronic systolic CHF, HTN, Bilarteral LE edema, carotid stenosis, MR after admit for knee pain after a fall and large hematoma, for the evaluation of atrial fib and 4 sec pause.  We decreased BB.  Has been stable overnight. Today no chest pain no SOB.  No arrythmia   Vital Signs .    Vitals:   02/10/23 1447 02/10/23 1945 02/11/23 0447 02/11/23 0735  BP: (!) 130/57 (!) 141/56 (!) 158/57 (!) 156/62  Pulse: 67 72 69 75  Resp:  16 16 20   Temp: 98.1 F (36.7 C) 98.8 F (37.1 C)  97.8 F (36.6 C)  TempSrc: Oral Oral  Oral  SpO2: 96% 95% 90% 93%  Weight:      Height:        Intake/Output Summary (Last 24 hours) at 02/11/2023 1006 Last data filed at 02/11/2023 0000 Gross per 24 hour  Intake 1480 ml  Output --  Net 1480 ml      02/08/2023    5:31 PM 02/08/2023   11:47 AM 11/12/2022    8:18 AM  Last 3 Weights  Weight (lbs) 145 lb 8.1 oz 145 lb 148 lb 6.4 oz  Weight (kg) 66 kg 65.772 kg 67.314 kg      Telemetry/ECG    SR to slight ST at 100 tele reads HR 140s but incorrect. Occ PAC - Personally Reviewed  Physical Exam .   GEN: No acute distress.   Neck: No JVD Cardiac: RRR, no murmurs, rubs, or gallops.  Respiratory: Clear to auscultation bilaterally. GI: Soft, nontender, non-distended  MS: No edema on Rt Left dressed  and + edema  Assessment & Plan .     PA flutter vs SR with bursts PAT  hx of PAF, has been maintaining SR until now - Eliquis has been stopped due to bleed.  She is on amiodarone 100 would continue and continue BB at lower dose for now  chads Vasc is 6 but with acute hematoma of knee will need to hold eliquis. Did have chest pain with the rapid HR.  No acute ST changes but LBBB  Most likely to need PPM in future- will have her seen by EP after discharge  appt made  for [facility] office with Dr. Nelly Laurence and placed on AVS. 4 sec pause not conversion pause it does not seem though may have been rate controlled a flutter to SR vs tachy/brady syndrome  see above  Left knee hematoma/hemorrhagic bursitis has been aspirated followed by ortho  Anemia/hyponatremia/mild AKI/HTN per IM Chronic bilateral neck pain and chronic left sided lower back pain radiating down to leg -- plan for injection.  For questions or updates, please contact  [facility] Please consult [email or web address] for contact info under        Signed, Nada Boozer, NP   I have seen and examined the patient along with Nada Boozer, NP .  I have reviewed the chart, notes and new data.  I agree with PA/NP's note.  Key new complaints: no CV complaints Key examination changes: normal CV exam except paradoxically split S2. SBP a little high, but she has been off losartan and amlodipine. Key new findings / data: No true arrhythmia on telemetry. "Tachy" events are due to T wave oversensing by the monitor (double counting). No  pauses seen.  PLAN:  [facility] will sign off.   Medication Recommendations:  continue current doses of amiodarone (100 mg every other day) and metoprolol succinate 25 mg daily, losartan 50 mg twice daily.  Resume amlodipine if BP increases. Resume Eliquis as soon as OK with Ortho service. Other recommendations (labs, testing, etc):  n/a Follow up as an outpatient:  Has an appt w Dr. Nelly Laurence 09/06   Thurmon Fair, MD, [facility] [phone] 02/11/2023, 10:50 AM

## 2023-02-11 NOTE — Progress Notes (Addendum)
Orthopedic Surgery Progress Note   Assessment: Patient is a 87 y.o. female with left knee pain, seems consistent with septic prepatellar bursitis versus cellulitis. Still no pain with passive knee range of motion, so low concern for septic joint   Plan: -Operative plans: none at this time -Aspiration negative for gram stain and culture no growth to date -DVT ppx: per primary -Recommend continuing empiric antibiotic treatment -Weight bearing status: as tolerated -PT evaluate and treat -Pain controlm -Dispo: per primary  ___________________________________________________________________________  Subjective: Pain well controlled. Feels knee pain has gotten better,but not resolved, since admission. Feels ice has been helpful to to the knee. No complaints this morning.     Physical Exam:  General: no acute distress, appears stated age Neurologic: alert, answering questions appropriately, following commands Respiratory: unlabored breathing on room air, symmetric chest rise Psychiatric: appropriate affect, normal cadence to speech  MSK:   -Left lower extremity  TTP over the anterior knee. Ecchymosis around the anterior and posterior aspect of the knee is unchanged. Erythema has receded from the demarcated area and appears improved. No active drainage. No blistering seen over anterior knee             No pain through passive knee range of motion EHL/TA/GSC intact Plantarflexes and dorsiflexes toes Sensation intact to light touch in sural, saphenous, tibial, deep peroneal, and superficial peroneal nerve distributions Foot warm and well perfused   Has been afebrile. WBC has been downtrending from 12.7 at admission to 9.4 yesterday.   Patient name: Lydia Graham Patient MRN: [id number] Date: 02/11/23

## 2023-02-11 NOTE — Progress Notes (Signed)
Mobility Specialist: Progress Note   02/11/23 1152  Mobility  Activity Ambulated with assistance in hallway  Level of Assistance Standby assist, set-up cues, supervision of patient - no hands on  Assistive Device Front wheel walker  Distance Ambulated (ft) 400 ft  Activity Response Tolerated well  Mobility Referral Yes  $Mobility charge 1 Mobility  Mobility Specialist Start Time (ACUTE ONLY) 1135  Mobility Specialist Stop Time (ACUTE ONLY) 1149  Mobility Specialist Time Calculation (min) (ACUTE ONLY) 14 min   Pre-Mobility: HR 68 During Mobility: HR 90-120 Post-Mobility: HR 71-78  Pt was agreeable to mobility session - received in bed. Had c/o "stabbing" pain in LLE. Denies any SOB, fatigue, or dizziness. Returned to room without fault. Left in bed with all needs met, call bell in reach. Family in room.   Maurene Capes Mobility Specialist Please contact via SecureChat or Rehab office at [phone]

## 2023-02-11 NOTE — Plan of Care (Signed)
  Problem: Activity: Goal: Risk for activity intolerance will decrease Outcome: Progressing   Problem: Pain Managment: Goal: General experience of comfort will improve Outcome: Progressing   Problem: Elimination: Goal: Will not experience complications related to bowel motility Outcome: Not Progressing

## 2023-02-11 NOTE — Progress Notes (Signed)
Admits with L knee hematoma/cellulitis, hemorrhagic bursitis .   02/11/23 1511  TOC Brief Assessment  Insurance and Status Reviewed  Patient has primary care physician Yes  Home environment has been reviewed From home alone. Supportive daughter.  Prior level of function: Independent with ADL's, no DME usage. Owns a cane.  Prior/Current Home Services No current home services  Social Determinants of Health Reivew SDOH reviewed no interventions necessary  Readmission risk has been reviewed No  Transition of care needs no transition of care needs at this time   Pt has  transportation to home . Pt without RX med concerns.  TOC team following and will assist with needs...Marland KitchenMarland Kitchen

## 2023-02-12 DIAGNOSIS — I455 Other specified heart block: Secondary | ICD-10-CM

## 2023-02-12 DIAGNOSIS — E871 Hypo-osmolality and hyponatremia: Secondary | ICD-10-CM

## 2023-02-12 DIAGNOSIS — M7042 Prepatellar bursitis, left knee: Secondary | ICD-10-CM | POA: Diagnosis not present

## 2023-02-12 DIAGNOSIS — I10 Essential (primary) hypertension: Secondary | ICD-10-CM

## 2023-02-12 DIAGNOSIS — M25562 Pain in left knee: Secondary | ICD-10-CM

## 2023-02-12 DIAGNOSIS — L0390 Cellulitis, unspecified: Secondary | ICD-10-CM | POA: Diagnosis not present

## 2023-02-12 DIAGNOSIS — I447 Left bundle-branch block, unspecified: Secondary | ICD-10-CM

## 2023-02-12 DIAGNOSIS — D649 Anemia, unspecified: Secondary | ICD-10-CM

## 2023-02-12 DIAGNOSIS — I480 Paroxysmal atrial fibrillation: Secondary | ICD-10-CM

## 2023-02-12 DIAGNOSIS — L03116 Cellulitis of left lower limb: Secondary | ICD-10-CM

## 2023-02-12 DIAGNOSIS — N179 Acute kidney failure, unspecified: Secondary | ICD-10-CM

## 2023-02-12 LAB — BODY FLUID CULTURE W GRAM STAIN: Culture: NO GROWTH

## 2023-02-12 MED ORDER — CEPHALEXIN 250 MG PO CAPS: 250.0000 mg | ORAL_CAPSULE | Freq: Four times a day (QID) | ORAL | 0 refills | Status: AC

## 2023-02-12 MED ORDER — OXYCODONE HCL 5 MG PO TABS: 5.0000 mg | ORAL_TABLET | Freq: Four times a day (QID) | ORAL | 0 refills | Status: DC | PRN

## 2023-02-12 MED ORDER — APIXABAN 5 MG PO TABS: 5.0000 mg | ORAL_TABLET | Freq: Two times a day (BID) | ORAL | Status: DC

## 2023-02-12 MED ORDER — METOPROLOL SUCCINATE ER 25 MG PO TB24: 25.0000 mg | ORAL_TABLET | Freq: Every day | ORAL | 0 refills | Status: DC

## 2023-02-12 NOTE — Progress Notes (Addendum)
Orthopedic Surgery Progress Note   Assessment: Patient is a 87 y.o. female with left knee pain, seems consistent with cellulitis at this time   Plan: -Operative plans: none at this time -Aspiration negative for gram stain and culture no growth to date this morning -DVT ppx: per primary -Recommend continuing empiric antibiotic treatment -Ice, elevation, and compressive wrap the the knee -Weight bearing status: as tolerated -PT evaluate and treat -Pain controlm -Dispo: per primary, should follow up in one week in office  ___________________________________________________________________________  Subjective: Feels knee pain has continued to improve. Icing is still helpful in her opinion. She feels hat swelling has improved as well   Physical Exam:  General: no acute distress, appears stated age Neurologic: sleeping but awakes to voice, answering questions appropriately, following commands Respiratory: unlabored breathing on room air, symmetric chest rise Psychiatric: appropriate affect, normal cadence to speech  MSK:   -Left lower extremity  Less TTP over the anterior knee. Ecchymosis around the anterior and posterior aspect of the knee is unchanged. Erythema continues to recede from the demarcated area. No active drainage. No blistering seen over anterior knee             No pain through passive knee range of motion EHL/TA/GSC intact Plantarflexes and dorsiflexes toes Sensation intact to light touch in sural, saphenous, tibial, deep peroneal, and superficial peroneal nerve distributions Foot warm and well perfused   Remains afebrile  Patient name: Lydia Graham Patient MRN: [id number] Date: 02/12/23

## 2023-02-12 NOTE — Discharge Summary (Signed)
Physician Discharge Summary  [name] ZOX:[id number] DOB: Oct 01, 1935 DOA: 02/08/2023  PCP: [name], FNP  Admit date: 02/08/2023 Discharge date: 02/12/2023  Admitted From: Home Disposition: Home  Recommendations for Outpatient Follow-up:  Follow up with PCP in 1-2 weeks Follow-up with orthopedic surgery as scheduled  Home Health: None Equipment/Devices: None  Discharge Condition: Stable CODE STATUS: Full Diet recommendation: Low-salt low-fat diet  Brief/Interim Summary: 87 y.o. female with medical history significant of paroxysmal A-fib on Eliquis, hypertension, GERD, carotid stenosis status post left carotid endarterectomy in 2018 presenting with complaint of left knee pain, swelling, and bruising since after a fall a week ago.  Patient states she was seen by orthopedics and had an x-ray done which did not show a fracture but she was told there could be some bleeding in her knee as she is on Eliquis.  States she was given a steroid injection at that time.  She went to urgent care on the day of admission, and was directed to the ER.  CT of the knee showed hyperdense fluid collection superficial to the patellar tendon measuring 6.2 x 6.5 x 2 cm, consistent with hematoma/hemorrhagic bursitis.  There was concern about underlying infection, as such she was started on antibiotics and admitted to the hospital.  Patient's hospital course has been wholly unremarkable, patient without fever since admission, mild leukocytosis at intake at 12.7 resolved currently within normal limits.  Cultures remain negative thus far, given improvement in patient's current regimen will transition to p.o. Keflex for an additional week to complete 10-day course.  Repeat evaluation outpatient with orthopedics including imaging and/or labs per their expertise.  Otherwise given patient's negative findings stable nature and ongoing treatment she is otherwise stable and agreeable for discharge home.  Family at  bedside agrees he looks well, family support at home with multiple family members.  Comorbid conditions otherwise stable without further need for intervention.  Discharge Diagnoses:  Principal Problem:   Bursitis Active Problems:   PAF (paroxysmal atrial fibrillation) (HCC)   AKI (acute kidney injury) (HCC)   Hyponatremia   Essential hypertension   Normocytic anemia   Knee pain   Cellulitis   Sinus pause   LBBB (left bundle branch block)    Discharge Instructions   Allergies as of 02/12/2023       Reactions   Crestor [rosuvastatin] Other (See Comments)   Myalgias   Other Other (See Comments)   Hypotension caused by pain medication/anesthesia   Singulair [montelukast Sodium] Other (See Comments)   Fatigue    Zestril [lisinopril] Cough        Medication List     TAKE these medications    amiodarone 200 MG tablet Commonly known as: PACERONE Take 0.5 tablets (100 mg total) by mouth every other day.   amLODipine 5 MG tablet Commonly known as: NORVASC TAKE 1 TABLET AT 8AM AND 1 TABLET AT 8PM.   apixaban 5 MG Tabs tablet Commonly known as: ELIQUIS Take 1 tablet (5 mg total) by mouth 2 (two) times daily.   ARTIFICIAL TEARS OP Place 2 drops into both eyes 2 (two) times daily as needed (for dry eyes).   CALCIUM + VITAMIN D3 PO Take 1 tablet by mouth daily.   cephALEXin 250 MG capsule Commonly known as: KEFLEX Take 1 capsule (250 mg total) by mouth 4 (four) times daily for 7 days.   estradiol 0.5 MG tablet Commonly known as: ESTRACE Take 0.5 mg by mouth daily.   furosemide 20 MG tablet  Commonly known as: LASIX Take 1 tablet (20 mg total) by mouth as needed for fluid or edema.   losartan 50 MG tablet Commonly known as: COZAAR take 1 tablet at 10 IN THE MORNING and 1 tablet at 10 pm.   MAGNESIUM PO Take 1 capsule by mouth every evening.   metoprolol succinate 25 MG 24 hr tablet Commonly known as: TOPROL-XL Take 1 tablet (25 mg total) by mouth  daily. Start taking on: February 13, 2023 What changed:  medication strength See the new instructions.   oxyCODONE 5 MG immediate release tablet Commonly known as: Oxy IR/ROXICODONE Take 1 tablet (5 mg total) by mouth every 6 (six) hours as needed for moderate pain or severe pain.   pantoprazole 40 MG tablet Commonly known as: PROTONIX TAKE (1) TABLET TWICE DAILY.   Salonpas 3.06-29-08 % Ptch Generic drug: Camphor-Menthol-Methyl Sal Place 1 patch onto the skin at bedtime as needed (foot pain).   VITAMIN B-12 PO Take 1 tablet by mouth daily.        Follow-up Information     [name], FNP Follow up.   Specialty: Family Medicine Contact information: [address] [place] [address] [phone]         Mealor, Roberts Gaudy, MD Follow up on 02/28/2023.   Specialty: Cardiology Why: at 2:45 pm in the [facility] office Contact information: [address] [place] [address] [phone]         Antoine Poche, MD Follow up on 04/03/2023.   Specialty: Cardiology Why: at 10:40 AM Contact information: [address] [place] [address] [phone]                Allergies  Allergen Reactions   Crestor [Rosuvastatin] Other (See Comments)    Myalgias   Other Other (See Comments)    Hypotension caused by pain medication/anesthesia   Singulair [Montelukast Sodium] Other (See Comments)    Fatigue    Zestril [Lisinopril] Cough    Consultations: Orthopedic surgery  Procedures/Studies: CT Knee Left Wo Contrast  Result Date: 02/08/2023 CLINICAL DATA:  Septic arthritis. EXAM: CT OF THE LEFT KNEE WITHOUT CONTRAST TECHNIQUE: Multidetector CT imaging of the left knee was performed according to the standard protocol. Multiplanar CT image reconstructions were also generated. RADIATION DOSE REDUCTION: This exam was performed according to the departmental dose-optimization program which includes automated exposure control, adjustment of the mA  and/or kV according to patient size and/or use of iterative reconstruction technique. COMPARISON:  Knee x-ray 02/08/2023, 02/04/2023 FINDINGS: Bones/Joint/Cartilage Generalized osteopenia. No fracture or dislocation. Normal alignment. Chondrocalcinosis of the medial and lateral femorotibial compartments as can be seen with CPPD. Moderate medial femorotibial compartment joint space narrowing. Mild lateral femorotibial compartment joint space narrowing. Mild patellofemoral compartment joint space narrowing. Small knee joint effusion. Small Baker's cyst Ligaments Ligaments are suboptimally evaluated by CT. Muscles and Tendons Atrophy of the visualized portion of the distal long head of the biceps femoris muscle and semimembranosus muscle. No intramuscular fluid collection or hematoma. Soft tissue Hyperdense fluid collection superficial to the patellar tendon in the anterior subcutaneous fat measuring 6.2 x 6.5 x 2 cm most consistent with a hematoma/hemorrhagic bursitis. No soft tissue mass. IMPRESSION: 1. Hyperdense fluid collection superficial to the patellar tendon in the anterior subcutaneous fat measuring 6.2 x 6.5 x 2 cm most consistent with a hematoma/hemorrhagic bursitis. 2. No acute osseous injury of the left knee. 3. Tricompartmental osteoarthritis of the left knee. Electronically Signed  By: Elige Ko M.D.   On: 02/08/2023 14:37   DG Knee Complete 4 Views Left  Result Date: 02/08/2023 CLINICAL DATA:  Patient fell 1 week ago. Wound on the anterior portion of the knee. Blistering and redness. EXAM: LEFT KNEE - COMPLETE 4+ VIEW COMPARISON:  None Available. FINDINGS: Bones are diffusely demineralized. No evidence for an acute fracture or dislocation. Loss of joint space noted lateral compartment with subchondral sclerosis. Trace spurring noted patellofemoral compartment. Small joint effusion. Subtle cortical off step noted tibial tuberosity with some lucency of the tuberosity compared to background  metaphyseal mineralization. IMPRESSION: 1. No acute bony traumatic abnormality. Subtle lucency anterior tibial tuberosity with associated subtle cortical off step. There does appear to be some soft tissue swelling overlying this region. If there is clinical concern for osteomyelitis, MRI with and without contrast would be the study of choice to further evaluate. 2. Degenerative changes in the lateral and patellofemoral compartments. 3. Small joint effusion. Electronically Signed   By: Kennith Center M.D.   On: 02/08/2023 12:45   XR Knee 1-2 Views Left  Result Date: 02/05/2023 Radiographs of the left knee show significant degenerative changes with bone-on-bone contact.  No acute finding.    Subjective: No acute issues or events overnight denies nausea vomiting diarrhea constipation any fevers chills or chest pain.  Knee pain and range of motion improving but not completely resolved.   Discharge Exam: Vitals:   02/12/23 0448 02/12/23 0732  BP: (!) 164/69 (!) 135/55  Pulse: 77 78  Resp:  16  Temp:  98.2 F (36.8 C)  SpO2: 94% 93%   Vitals:   02/11/23 1503 02/11/23 2048 02/12/23 0448 02/12/23 0732  BP: (!) 144/65 (!) 141/59 (!) 164/69 (!) 135/55  Pulse: 68 70 77 78  Resp:    16  Temp: 98 F (36.7 C) 98.5 F (36.9 C)  98.2 F (36.8 C)  TempSrc: Oral Oral  Oral  SpO2: 96% 93% 94% 93%  Weight:      Height:        General: Pt is alert, awake, not in acute distress Cardiovascular: RRR, S1/S2 +, no rubs, no gallops Respiratory: CTA bilaterally, no wheezing, no rhonchi Abdominal: Soft, NT, ND, bowel sounds + Extremities: no edema, no cyanosis    The results of significant diagnostics from this hospitalization (including imaging, microbiology, ancillary and laboratory) are listed below for reference.     Microbiology: Recent Results (from the past 240 hour(s))  Blood culture (routine x 2)     Status: None (Preliminary result)   Collection Time: 02/08/23  5:12 PM   Specimen: BLOOD   Result Value Ref Range Status   Specimen Description BLOOD RIGHT ANTECUBITAL  Final   Special Requests   Final    BOTTLES DRAWN AEROBIC AND ANAEROBIC Blood Culture adequate volume   Culture   Final    NO GROWTH 4 DAYS Performed at [facility], [address], [place], [place] [address]    Report Status PENDING  Incomplete  Blood culture (routine x 2)     Status: None (Preliminary result)   Collection Time: 02/08/23  5:27 PM   Specimen: BLOOD LEFT FOREARM  Result Value Ref Range Status   Specimen Description BLOOD LEFT FOREARM  Final   Special Requests   Final    BOTTLES DRAWN AEROBIC AND ANAEROBIC Blood Culture adequate volume   Culture   Final    NO GROWTH 4 DAYS Performed at [facility], [address] [place], [place] [address]    Report Status PENDING  Incomplete  Body fluid culture w Gram Stain     Status: None   Collection Time: 02/09/23  9:36 AM   Specimen: Path fluid; Body Fluid  Result Value Ref Range Status   Specimen Description FLUID  Final   Special Requests BURSA, SYNOVIAL CYST  Final   Gram Stain   Final    RARE WBC PRESENT, PREDOMINANTLY PMN NO ORGANISMS SEEN    Culture   Final    NO GROWTH 3 DAYS Performed at [facility], [address] [place], [place] [address]    Report Status 02/12/2023 FINAL  Final     Labs: BNP (last 3 results) No results for input(s): "BNP" in the last 8760 hours. Basic Metabolic Panel: Recent Labs  Lab 02/08/23 1414 02/09/23 0229 02/10/23 0410  NA 131* 130* 131*  K 4.5 4.4 4.5  CL 98 100 100  CO2 23 22 20*  GLUCOSE 119* 108* 100*  BUN 21 20 15   CREATININE 1.54* 0.99 0.87  CALCIUM 9.2 8.5* 8.7*  MG  --   --  1.9   Liver Function Tests: Recent Labs  Lab 02/08/23 1414 02/10/23 0410  AST 21 16  ALT 12 11  ALKPHOS 76 63  BILITOT 0.7 0.6  PROT 7.6 6.4*  ALBUMIN 4.0 3.0*   No results for input(s): "LIPASE", "AMYLASE" in the last 168 hours. No results for input(s): "AMMONIA" in the last  168 hours. CBC: Recent Labs  Lab 02/08/23 1414 02/09/23 0229 02/10/23 0410  WBC 12.7* 10.0 9.4  NEUTROABS 9.7*  --   --   HGB 11.6* 9.9* 10.9*  HCT 36.0 31.3* 34.3*  MCV 88.2 88.9 89.3  PLT 462* 364 400   Cardiac Enzymes: No results for input(s): "CKTOTAL", "CKMB", "CKMBINDEX", "TROPONINI" in the last 168 hours. BNP: Invalid input(s): "POCBNP" CBG: No results for input(s): "GLUCAP" in the last 168 hours. D-Dimer No results for input(s): "DDIMER" in the last 72 hours. Hgb A1c No results for input(s): "HGBA1C" in the last 72 hours. Lipid Profile No results for input(s): "CHOL", "HDL", "LDLCALC", "TRIG", "CHOLHDL", "LDLDIRECT" in the last 72 hours. Thyroid function studies No results for input(s): "TSH", "T4TOTAL", "T3FREE", "THYROIDAB" in the last 72 hours.  Invalid input(s): "FREET3" Anemia work up No results for input(s): "VITAMINB12", "FOLATE", "FERRITIN", "TIBC", "IRON", "RETICCTPCT" in the last 72 hours. Urinalysis    Component Value Date/Time   COLORURINE YELLOW 12/25/2021 0115   APPEARANCEUR CLEAR 12/25/2021 0115   LABSPEC 1.013 12/25/2021 0115   PHURINE 6.0 12/25/2021 0115   GLUCOSEU NEGATIVE 12/25/2021 0115   HGBUR SMALL (A) 12/25/2021 0115   BILIRUBINUR NEGATIVE 12/25/2021 0115   KETONESUR NEGATIVE 12/25/2021 0115   PROTEINUR NEGATIVE 12/25/2021 0115   NITRITE NEGATIVE 12/25/2021 0115   LEUKOCYTESUR NEGATIVE 12/25/2021 0115   Sepsis Labs Recent Labs  Lab 02/08/23 1414 02/09/23 0229 02/10/23 0410  WBC 12.7* 10.0 9.4   Microbiology Recent Results (from the past 240 hour(s))  Blood culture (routine x 2)     Status: None (Preliminary result)   Collection Time: 02/08/23  5:12 PM   Specimen: BLOOD  Result Value Ref Range Status   Specimen Description BLOOD RIGHT ANTECUBITAL  Final   Special Requests   Final    BOTTLES DRAWN AEROBIC AND ANAEROBIC Blood Culture adequate volume   Culture   Final    NO GROWTH 4 DAYS Performed at [facility],  [address] [place], [place] [address]    Report Status PENDING  Incomplete  Blood culture (routine x 2)     Status: None (Preliminary result)   Collection Time: 02/08/23  5:27 PM   Specimen: BLOOD LEFT FOREARM  Result Value Ref Range Status   Specimen Description BLOOD LEFT FOREARM  Final   Special Requests   Final    BOTTLES DRAWN AEROBIC AND ANAEROBIC Blood Culture adequate volume   Culture   Final    NO GROWTH 4 DAYS Performed at [facility], [address] [place], [place] [address]    Report Status PENDING  Incomplete  Body fluid culture w Gram Stain     Status: None   Collection Time: 02/09/23  9:36 AM   Specimen: Path fluid; Body Fluid  Result Value Ref Range Status   Specimen Description FLUID  Final   Special Requests BURSA, SYNOVIAL CYST  Final   Gram Stain   Final    RARE WBC PRESENT, PREDOMINANTLY PMN NO ORGANISMS SEEN    Culture   Final    NO GROWTH 3 DAYS Performed at [facility], [address], [place], [place] [address]    Report Status 02/12/2023 FINAL  Final     Time coordinating discharge: Over 30 minutes  SIGNED:   Azucena Fallen, DO [facility] 02/12/2023, 2:46 PM Pager   If 7PM-7AM, please contact night-coverage [email or web address]

## 2023-02-12 NOTE — Progress Notes (Signed)
Mobility Specialist: Progress Note   02/12/23 1140  Mobility  Activity Ambulated with assistance in hallway  Level of Assistance Standby assist, set-up cues, supervision of patient - no hands on  Assistive Device Cane  Distance Ambulated (ft) 300 ft  Activity Response Tolerated well  Mobility Referral Yes  $Mobility charge 1 Mobility  Mobility Specialist Start Time (ACUTE ONLY) 1059  Mobility Specialist Stop Time (ACUTE ONLY) 1113  Mobility Specialist Time Calculation (min) (ACUTE ONLY) 14 min    Received pt in bathroom having no complaints and agreeable to mobility. Pt was asymptomatic throughout ambulation and returned to room w/o fault. Left on EOB w/ call bell in reach and all needs met. Family in room.   Maurene Capes Mobility Specialist Please contact via SecureChat or Rehab office at [phone]

## 2023-02-12 NOTE — Progress Notes (Signed)
Patient discharged, discharge instruction given and explained. No additional questions and or concerns at this time. Patient IV removed per protocol. Instructions from Ortho MD for wound care/shower given and explained, additional supplies given. Patient discharged via volunteer service through wheelchair for safety.

## 2023-02-12 NOTE — Care Management Important Message (Signed)
Important Message  Patient Details  Name: Lydia Graham MRN: [id number] Date of Birth: June 04, 1936   Medicare Important Message Given:  Yes     Sherilyn Banker 02/12/2023, 1:40 PM

## 2023-02-13 ENCOUNTER — Telehealth: Payer: Self-pay | Admitting: Cardiology

## 2023-02-13 ENCOUNTER — Telehealth: Payer: Self-pay

## 2023-02-13 ENCOUNTER — Ambulatory Visit: Payer: Medicare Other | Attending: Cardiovascular Disease

## 2023-02-13 DIAGNOSIS — M7042 Prepatellar bursitis, left knee: Secondary | ICD-10-CM

## 2023-02-13 DIAGNOSIS — L0390 Cellulitis, unspecified: Secondary | ICD-10-CM

## 2023-02-13 LAB — CULTURE, BLOOD (ROUTINE X 2)
Culture: NO GROWTH
Culture: NO GROWTH
Special Requests: ADEQUATE
Special Requests: ADEQUATE

## 2023-02-13 NOTE — Telephone Encounter (Signed)
Pt's daughter trying to cancel the telephone pre-op procedure because they are not going through with the procedure right now . Please advise.

## 2023-02-13 NOTE — Telephone Encounter (Signed)
Patient informed that her dose should be Toprol XL 25mg  DAILY - #30 tabs sent to [facility] pharmacy today by Dr. Carma Leaven.  She will have f/u her in [place] on 02/28/2023 with Dr. Nelly Laurence.  She verbalized understanding.

## 2023-02-13 NOTE — Transitions of Care (Post Inpatient/ED Visit) (Signed)
02/13/2023  Name: Lydia Graham MRN: [id number] DOB: [birth year]  Today's TOC FU Call Status: Today's TOC FU Call Status:: Successful TOC FU Call Completed TOC FU Call Complete Date: 02/13/23  Transition Care Management Follow-up Telephone Call Date of Discharge: 02/12/23 Discharge Facility: [facility]) Type of Discharge: Inpatient Admission Primary Inpatient Discharge Diagnosis:: Cellulitis Left Knee How have you been since you were released from the hospital?: Worse Any questions or concerns?: Yes Patient Questions/Concerns:: Patient and her daughter are concerned the red area on her knee is getting larger. Messaged PCP Patient Questions/Concerns Addressed: Notified Provider of Patient Questions/Concerns  Items Reviewed: Did you receive and understand the discharge instructions provided?: Yes Medications obtained,verified, and reconciled?: Yes (Medications Reviewed) Any new allergies since your discharge?: No Dietary orders reviewed?: No Do you have support at home?: Yes People in Home: child(ren), adult Name of Support/Comfort Primary Source: Rudine  Medications Reviewed Today: Medications Reviewed Today     Reviewed by Jodelle Gross, RN (Case Manager) on 02/13/23 at 1359  Med List Status: <None>   Medication Order Taking? Sig Documenting Provider Last Dose Status Informant  amiodarone (PACERONE) 200 MG tablet [id number] Yes Take 0.5 tablets (100 mg total) by mouth every other day. Mealor, Roberts Gaudy, MD Taking Active Self, Child, Pharmacy Records           Med Note (COFFELL, Foye Spurling Feb 10, 2023 12:32 PM) Pt specified 1/2 tablet every other day.  amLODipine (NORVASC) 5 MG tablet [id number] Yes TAKE 1 TABLET AT 8AM AND 1 TABLET AT 8PM. Antoine Poche, MD Taking Active Self, Child, Pharmacy Records           Med Note (COFFELL, Foye Spurling Feb 10, 2023 12:33 PM) Pt and daughter state pt is still taking twice daily. Per dispense report, LF 08/26/2022 #60,  30 DS.  apixaban (ELIQUIS) 5 MG TABS tablet [id number] Yes Take 1 tablet (5 mg total) by mouth 2 (two) times daily. Mealor, Roberts Gaudy, MD Taking Active Self, Child, Pharmacy Records  Calcium Carb-Cholecalciferol (CALCIUM + VITAMIN D3 PO) [id number] Yes Take 1 tablet by mouth daily. [provider] Taking Active Self, Child, Pharmacy Records  Camphor-Menthol-Methyl Sal (SALONPAS) 3.06-29-08 % [facility] [id number]  Place 1 patch onto the skin at bedtime as needed (foot pain). [provider]  Active Child, Self, Pharmacy Records           Med Note Willa Rough, APRIL S   Fri Jul 27, 2021 10:18 AM)    cephALEXin (KEFLEX) 250 MG capsule [id number] Yes Take 1 capsule (250 mg total) by mouth 4 (four) times daily for 7 days. Azucena Fallen, MD Taking Active   Cyanocobalamin (VITAMIN B-12 PO) [id number] Yes Take 1 tablet by mouth daily. [provider] Taking Active Self, Child, Pharmacy Records  estradiol (ESTRACE) 0.5 MG tablet [id number] Yes Take 0.5 mg by mouth daily.  [provider] Taking Active Child, Self, Pharmacy Records  furosemide (LASIX) 20 MG tablet [id number] Yes Take 1 tablet (20 mg total) by mouth as needed for fluid or edema. Jodelle Gross, NP Taking Active Self, Child, Pharmacy Records  Hypromellose (ARTIFICIAL TEARS OP) [id number] Yes Place 2 drops into both eyes 2 (two) times daily as needed (for dry eyes). [provider] Taking Active Child, Self, Pharmacy Records  losartan (COZAAR) 50 MG tablet [id number] Yes take 1 tablet at 10 IN THE MORNING and 1 tablet at 10 pm. Antoine Poche, MD Taking Active  Self, Child, Pharmacy Records           Med Note Gertha Calkin, Foye Spurling Feb 10, 2023 12:33 PM) Pt and daughter state pt is taking twice daily. Per dispense report, LF 11/06/2022 #60, 30 DS.  MAGNESIUM PO [id number] Yes Take 1 capsule by mouth every evening. [provider] Taking Active Self, Child, Pharmacy Records  metoprolol succinate  (TOPROL-XL) 25 MG 24 hr tablet [id number] Yes Take 1 tablet (25 mg total) by mouth daily. Azucena Fallen, MD Taking Active            Med Note Electa Sniff, [facility]   Thu Feb 13, 2023  1:59 PM) Patient was breaking her 50mg  tabs in half.  Advised she should not be breaking XL tabs. Pt to pick up the 25mg  tab today.  oxyCODONE (OXY IR/ROXICODONE) 5 MG immediate release tablet [id number] Yes Take 1 tablet (5 mg total) by mouth every 6 (six) hours as needed for moderate pain or severe pain. Azucena Fallen, MD Taking Active   pantoprazole (PROTONIX) 40 MG tablet [id number] Yes TAKE (1) TABLET TWICE DAILY. Antoine Poche, MD Taking Active Self, Child, Pharmacy Records            Home Care and Equipment/Supplies: Were Home Health Services Ordered?: No Any new equipment or medical supplies ordered?: No  Functional Questionnaire: Do you need assistance with bathing/showering or dressing?: Yes Do you need assistance with meal preparation?: No Do you need assistance with eating?: No Do you have difficulty maintaining continence: No Do you need assistance with getting out of bed/getting out of a chair/moving?: Yes Do you have difficulty managing or taking your medications?: No  Follow up appointments reviewed: PCP Follow-up appointment confirmed?: Yes Date of PCP follow-up appointment?: 02/14/23 Follow-up Provider: Marlana Salvage, NP Specialist Hospital Follow-up appointment confirmed?: Yes Date of Specialist follow-up appointment?: 02/28/23 Follow-Up Specialty Provider:: Dr. Nelly Laurence Do you need transportation to your follow-up appointment?: No Do you understand care options if your condition(s) worsen?: Yes-patient verbalized understanding  SDOH Interventions Today    Flowsheet Row Most Recent Value  SDOH Interventions   Food Insecurity Interventions Intervention Not Indicated  Housing Interventions Intervention Not Indicated      Interventions Today    Flowsheet Row Most  Recent Value  Pharmacy Interventions   Pharmacy Dicussed/Reviewed Pharmacy Topics Discussed  [Discussed patients Metoprolol as patient was cutting her 50mg  tablet.  Explained XL medications should not be cut.  Patient to pick up the 25mg  tabs today.]       TOC Interventions Today    Flowsheet Row Most Recent Value  TOC Interventions   TOC Interventions Discussed/Reviewed TOC Interventions Discussed, TOC Interventions Reviewed, Contacted provider for patient needs      Jodelle Gross RN, BSN, CCM [facility] RN Care Coordinator/ Transitions of Care Direct Dial: [phone]  Fax: [phone]

## 2023-02-13 NOTE — Telephone Encounter (Signed)
Patient is calling to get clarification on her medication metoprolol succinate (TOPROL-XL) 25 MG 24 hr tablet on how to take it

## 2023-02-13 NOTE — Telephone Encounter (Signed)
Appointment has been canceled and a message was left on voicemail for Dr Newtons office making them aware; advised a call back with any questions or concerns.

## 2023-02-14 ENCOUNTER — Encounter: Payer: Self-pay | Admitting: Family Medicine

## 2023-02-14 ENCOUNTER — Ambulatory Visit (INDEPENDENT_AMBULATORY_CARE_PROVIDER_SITE_OTHER): Payer: Medicare Other | Admitting: Family Medicine

## 2023-02-14 VITALS — BP 134/70 | HR 83 | Ht 67.0 in | Wt 145.1 lb

## 2023-02-14 DIAGNOSIS — L03116 Cellulitis of left lower limb: Secondary | ICD-10-CM | POA: Diagnosis not present

## 2023-02-14 DIAGNOSIS — L0390 Cellulitis, unspecified: Secondary | ICD-10-CM

## 2023-02-14 MED ORDER — DOXYCYCLINE HYCLATE 100 MG PO TABS
100.0000 mg | ORAL_TABLET | Freq: Two times a day (BID) | ORAL | 0 refills | Status: AC
Start: 2023-02-14 — End: 2023-02-21

## 2023-02-14 NOTE — Patient Instructions (Addendum)
I appreciate the opportunity to provide care to you today!    Follow up:  1 months  Labs: please stop by the lab today to get your blood drawn (CBC and BMP)  Please ensure that you complete the full course of antibiotics as prescribed: -Doxycycline: Take 100 mg twice daily (BID) for 7 days. -Keflex: Take 250 mg four times daily for 7 days. Additionally, I recommend increasing your protein intake to support wound healing.  Please follow up if you experience the following symptoms:  Increased Redness: The affected area may exhibit pronounced redness. Swelling: There may be noticeable swelling and puffiness of the skin. Heat: The area could feel warm to the touch. Pain or Tenderness: The infected region might be painful or tender. Fever: A high temperature or chills could accompany the infection. Skin Changes: You may observe blisters or sores in the affected area.   Referrals today-  [facility] for wound care   Please continue to a heart-healthy diet and increase your physical activities. Try to exercise for at least five days a week.    It was a pleasure to see you and I look forward to continuing to work together on your health and well-being. Please do not hesitate to call the office if you need care or have questions about your care.  In case of emergency, please visit the Emergency Department for urgent care, or contact our clinic at [phone] to schedule an appointment. We're here to help you!   Have a wonderful day and week. With Gratitude, [name] MSN, FNP-BC

## 2023-02-14 NOTE — Progress Notes (Unsigned)
Established Patient Office Visit  Subjective:  Patient ID: Lydia Graham, female    DOB: 23-Nov-1935  Age: 87 y.o. MRN: [id number]  CC:  Chief Complaint  Patient presents with   Follow-up    Follow up sore on leg in hospital from Saturday to Wednesday     HPI [name] is a 87 y.o. female with past medical history of *** presents for f/u of *** chronic medical conditions.  Past Medical History:  Diagnosis Date   Arthritis    oa, all over - multiple areas    Carotid artery occlusion    Dysrhythmia    AFib    GERD (gastroesophageal reflux disease)    Headache    h/o migraines    Hypertension    PAF (paroxysmal atrial fibrillation) (HCC)    a. multiple DCCV's in 2019 and eventually required initiation of Amiodarone with successful DCCV after this. b. recurrent in 12/2021 while admitted for Urosepsis    Past Surgical History:  Procedure Laterality Date   ABDOMINAL HYSTERECTOMY     APPENDECTOMY     BACK SURGERY     CARDIOVERSION N/A 12/19/2017   Procedure: CARDIOVERSION;  Surgeon: Lydia Poche, MD;  Location: [facility] ENDO [facility]ITE;  Service: Endoscopy;  Laterality: N/A;   CARDIOVERSION N/A 12/26/2017   Procedure: CARDIOVERSION;  Surgeon: Lydia Nose, MD;  Location: [facility] ENDOSCOPY;  Service: Cardiovascular;  Laterality: N/A;   CARDIOVERSION N/A 01/26/2018   Procedure: CARDIOVERSION;  Surgeon: Lydia Bathe, MD;  Location: [facility] ENDOSCOPY;  Service: Cardiovascular;  Laterality: N/A;   CAROTID ENDARTERECTOMY     CATARACT EXTRACTION W/PHACO Right 03/09/2021   Procedure: CATARACT EXTRACTION PHACO AND INTRAOCULAR LENS PLACEMENT with Placement of Corticosteroid (IOC);  Surgeon: Lydia Pierce, MD;  Location: [facility] ORS;  Service: Ophthalmology;  Laterality: Right;  CDE 9.16   CATARACT EXTRACTION W/PHACO Left 04/02/2021   Procedure: CATARACT EXTRACTION PHACO AND INTRAOCULAR LENS PLACEMENT LEFT EYE;  Surgeon: Lydia Pierce, MD;  Location: [facility] ORS;  Service: Ophthalmology;   Laterality: Left;  left CDE=6.75   ENDARTERECTOMY Left 03/27/2017   Procedure: ENDARTERECTOMY CAROTID LEFT;  Surgeon: Lydia Harman, MD;  Location: [facility] OR;  Service: Vascular;  Laterality: Left;   INNER EAR SURGERY Right    puntured eardrum- repaired 2x's    KNEE ARTHROPLASTY     NASAL SINUS SURGERY     deviated septum   PATCH ANGIOPLASTY Left 03/27/2017   Procedure: PATCH ANGIOPLASTY LEFT CAROTID ARTERY USING Lydia Graham BIOLOGIC PATCH;  Surgeon: Lydia Harman, MD;  Location: [facility] OR;  Service: Vascular;  Laterality: Left;   TONSILLECTOMY     TOTAL KNEE ARTHROPLASTY Right 10/23/2016   Procedure: RIGHT TOTAL KNEE ARTHROPLASTY;  Surgeon: Lydia Mustard, MD;  Location: [facility] OR;  Service: Orthopedics;  Laterality: Right;    Family History  Problem Relation Age of Onset   Stroke Mother    Rheum arthritis Sister     Social History   Socioeconomic History   Marital status: Single    Spouse name: Not on file   Number of children: Not on file   Years of education: Not on file   Highest education level: Not on file  Occupational History   Not on file  Tobacco Use   Smoking status: Never   Smokeless tobacco: Never  Vaping Use   Vaping status: Never Used  Substance and Sexual Activity   Alcohol use: No   Drug use: No   Sexual activity: Not Currently  Birth control/protection: Surgical  Other Topics Concern   Not on file  Social History Narrative   Not on file   Social Determinants of Health   Financial Resource Strain: Low Risk  (06/28/2022)   Overall Financial Resource Strain (CARDIA)    Difficulty of Paying Living Expenses: Not hard at all  Food Insecurity: No Food Insecurity (02/13/2023)   Hunger Vital Sign    Worried About Running Out of Food in the Last Year: Never true    Ran Out of Food in the Last Year: Never true  Transportation Needs: No Transportation Needs (06/28/2022)   PRAPARE - Administrator, Civil Service (Medical): No    Lack of  Transportation (Non-Medical): No  Physical Activity: Sufficiently Active (06/28/2022)   Exercise Vital Sign    Days of Exercise per Week: 3 days    Minutes of Exercise per Session: 50 min  Stress: No Stress Concern Present (06/28/2022)   [employer] of Occupational Health - Occupational Stress Questionnaire    Feeling of Stress : Not at all  Social Connections: Moderately Integrated (06/28/2022)   Social Connection and Isolation Panel [NHANES]    Frequency of Communication with Friends and Family: More than three times a week    Frequency of Social Gatherings with Friends and Family: More than three times a week    Attends Religious Services: More than 4 times per year    Active Member of [employer] or Organizations: Yes    Attends Banker Meetings: More than 4 times per year    Marital Status: Widowed  Intimate Partner Violence: Not At Risk (06/28/2022)   Humiliation, Afraid, Rape, and Kick questionnaire    Fear of Current or Ex-Partner: No    Emotionally Abused: No    Physically Abused: No    Sexually Abused: No    Outpatient Medications Prior to Visit  Medication Sig Dispense Refill   amiodarone (PACERONE) 200 MG tablet Take 0.5 tablets (100 mg total) by mouth every other day. 45 tablet 3   amLODipine (NORVASC) 5 MG tablet TAKE 1 TABLET AT 8AM AND 1 TABLET AT 8PM. 60 tablet 6   apixaban (ELIQUIS) 5 MG TABS tablet Take 1 tablet (5 mg total) by mouth 2 (two) times daily. 60 tablet 5   Calcium Carb-Cholecalciferol (CALCIUM + VITAMIN D3 PO) Take 1 tablet by mouth daily.     Camphor-Menthol-Methyl Sal (SALONPAS) 3.06-29-08 % PTCH Place 1 patch onto the skin at bedtime as needed (foot pain).     cephALEXin (KEFLEX) 250 MG capsule Take 1 capsule (250 mg total) by mouth 4 (four) times daily for 7 days. 28 capsule 0   Cyanocobalamin (VITAMIN B-12 PO) Take 1 tablet by mouth daily.     estradiol (ESTRACE) 0.5 MG tablet Take 0.5 mg by mouth daily.      furosemide (LASIX) 20 MG tablet  Take 1 tablet (20 mg total) by mouth as needed for fluid or edema. 30 tablet 2   Hypromellose (ARTIFICIAL TEARS OP) Place 2 drops into both eyes 2 (two) times daily as needed (for dry eyes).     losartan (COZAAR) 50 MG tablet take 1 tablet at 10 IN THE MORNING and 1 tablet at 10 pm. 60 tablet 9   MAGNESIUM PO Take 1 capsule by mouth every evening.     metoprolol succinate (TOPROL-XL) 25 MG 24 hr tablet Take 1 tablet (25 mg total) by mouth daily. 30 tablet 0   oxyCODONE (OXY IR/ROXICODONE) 5 MG  immediate release tablet Take 1 tablet (5 mg total) by mouth every 6 (six) hours as needed for moderate pain or severe pain. 30 tablet 0   pantoprazole (PROTONIX) 40 MG tablet TAKE (1) TABLET TWICE DAILY. 60 tablet 6   No facility-administered medications prior to visit.    Allergies  Allergen Reactions   Crestor [Rosuvastatin] Other (See Comments)    Myalgias   Other Other (See Comments)    Hypotension caused by pain medication/anesthesia   Singulair [Montelukast Sodium] Other (See Comments)    Fatigue    Zestril [Lisinopril] Cough    ROS Review of Systems  Constitutional:  Negative for chills, fatigue and fever.  Respiratory:  Negative for chest tightness and shortness of breath.   Gastrointestinal:  Negative for nausea and vomiting.  Skin:  Positive for wound (left foot pain).  Neurological:  Negative for dizziness and headaches.      Objective:    Physical Exam  BP 134/70 (BP Location: Right Arm, Patient Position: Sitting, Cuff Size: Normal)   Pulse 83   Ht 5\' 7"  (1.702 m)   Wt 145 lb 1.9 oz (65.8 kg)   LMP  (LMP Unknown)   SpO2 91%   BMI 22.73 kg/m  Wt Readings from Last 3 Encounters:  02/14/23 145 lb 1.9 oz (65.8 kg)  02/08/23 145 lb 8.1 oz (66 kg)  02/08/23 145 lb (65.8 kg)    Lab Results  Component Value Date   TSH 3.830 09/06/2022   Lab Results  Component Value Date   WBC 9.4 02/10/2023   HGB 10.9 (L) 02/10/2023   HCT 34.3 (L) 02/10/2023   MCV 89.3 02/10/2023    PLT 400 02/10/2023   Lab Results  Component Value Date   NA 131 (L) 02/10/2023   K 4.5 02/10/2023   CO2 20 (L) 02/10/2023   GLUCOSE 100 (H) 02/10/2023   BUN 15 02/10/2023   CREATININE 0.87 02/10/2023   BILITOT 0.6 02/10/2023   ALKPHOS 63 02/10/2023   AST 16 02/10/2023   ALT 11 02/10/2023   PROT 6.4 (L) 02/10/2023   ALBUMIN 3.0 (L) 02/10/2023   CALCIUM 8.7 (L) 02/10/2023   ANIONGAP 11 02/10/2023   EGFR 43 (L) 09/06/2022   Lab Results  Component Value Date   CHOL 185 09/06/2022   Lab Results  Component Value Date   HDL 49 09/06/2022   Lab Results  Component Value Date   LDLCALC 114 (H) 09/06/2022   Lab Results  Component Value Date   TRIG 120 09/06/2022   Lab Results  Component Value Date   CHOLHDL 3.8 09/06/2022   Lab Results  Component Value Date   HGBA1C 5.6 09/06/2022      Assessment & Plan:  There are no diagnoses linked to this encounter.  Follow-up: No follow-ups on file.   [name], FNP

## 2023-02-15 DIAGNOSIS — L03116 Cellulitis of left lower limb: Secondary | ICD-10-CM | POA: Insufficient documentation

## 2023-02-15 LAB — CBC WITH DIFFERENTIAL/PLATELET
Basophils Absolute: 0.1 10*3/uL (ref 0.0–0.2)
Basos: 1 %
EOS (ABSOLUTE): 0.2 10*3/uL (ref 0.0–0.4)
Eos: 2 %
Hematocrit: 36.3 % (ref 34.0–46.6)
Hemoglobin: 11.5 g/dL (ref 11.1–15.9)
Immature Grans (Abs): 0 10*3/uL (ref 0.0–0.1)
Immature Granulocytes: 0 %
Lymphocytes Absolute: 1.6 10*3/uL (ref 0.7–3.1)
Lymphs: 14 %
MCH: 27.8 pg (ref 26.6–33.0)
MCHC: 31.7 g/dL (ref 31.5–35.7)
MCV: 88 fL (ref 79–97)
Monocytes Absolute: 0.9 10*3/uL (ref 0.1–0.9)
Monocytes: 8 %
Neutrophils Absolute: 8.6 10*3/uL — ABNORMAL HIGH (ref 1.4–7.0)
Neutrophils: 75 %
Platelets: 510 10*3/uL — ABNORMAL HIGH (ref 150–450)
RBC: 4.14 x10E6/uL (ref 3.77–5.28)
RDW: 13.5 % (ref 11.7–15.4)
WBC: 11.5 10*3/uL — ABNORMAL HIGH (ref 3.4–10.8)

## 2023-02-15 NOTE — Assessment & Plan Note (Addendum)
Encouraged to  complete the full course of antibiotics as prescribed: -Doxycycline: Take 100 mg twice daily (BID) for 7 days. -Keflex: Take 250 mg four times daily for 7 days. Additionally, I recommend increasing your protein intake to support wound healing. Please follow up immediately if you experience any of the following symptoms: increased Redness: The affected area may become more pronounced in redness. Swelling: Noticeable swelling or puffiness of the skin may occur. Heat: The area could feel warm to the touch. Pain or Tenderness: The infected region may become painful or tender. Fever: You might experience a high temperature or chills. Skin Changes: Blisters or sores may develop in the affected area. Referral placed to [facility] for wound care of the left knee

## 2023-02-16 NOTE — Progress Notes (Signed)
Please inform the patient that her white blood count, platelets, and neutrophils are slightly elevated, likely due to the infection in her left lower extremity. I recommend that she completes the full course of the prescribed antibiotic

## 2023-02-18 ENCOUNTER — Ambulatory Visit: Payer: Medicare Other | Admitting: Orthopedic Surgery

## 2023-02-18 DIAGNOSIS — L03116 Cellulitis of left lower limb: Secondary | ICD-10-CM | POA: Diagnosis not present

## 2023-02-18 MED ORDER — DOXYCYCLINE HYCLATE 100 MG PO TABS: 100.0000 mg | ORAL_TABLET | Freq: Two times a day (BID) | ORAL | 0 refills | Status: AC

## 2023-02-18 NOTE — Progress Notes (Signed)
Orthopedic Surgery Office Note     Assessment: Patient is a 87 y.o. female with left knee cellulitis that has gradually been improving with antibiotic management     Plan: -Operative plans: none at this time [facility] aspiration was negative for organism -ESR and CRP were elevated in the hospital, will get repeat labs at her next visit to trend -Prescribe doxycycline to complete a 2-week course -Ice, elevation, and compressive wrap the the knee -Weight bearing status: as tolerated -Follow-up in 10 days for repeat check, x-rays needed at next visit: None   ___________________________________________________________________________   Subjective: Patient has been doing better since she was seen in the hospital.  She says that the pain in her knee has gotten better.  She is only taking a single pain medication at night to go to sleep.  She is not having any drainage from the knee.  She is able to weight-bear on the knee. She has not noticed any numbness or paresthesias.     Physical Exam:   General: no acute distress, appears stated age Neurologic: sleeping but awakes to voice, answering questions appropriately, following commands Respiratory: unlabored breathing on room air, symmetric chest rise Psychiatric: appropriate affect, normal cadence to speech   MSK:    -Left lower extremity             Mild tenderness palpation over the anterior aspect of the knee near the patella.  No other tenderness to palpation over the knee.  Ecchymosis seen over the anterior and posterior aspects of the knee.  No drainage seen coming from the knee.  No pain through passive range of motion at the knee.  There is some erythema around the anterior aspect of the knee but it has receded from the previously demarcated lines which are still present from hospital stay EHL/TA/GSC intact Plantarflexes and dorsiflexes toes Sensation intact to light touch in sural, saphenous, tibial, deep peroneal, and superficial  peroneal nerve distributions Foot warm and well perfused     Patient name: Lydia Graham Patient MRN: [id number] Date: 02/18/23

## 2023-02-19 ENCOUNTER — Telehealth: Payer: Self-pay | Admitting: Orthopedic Surgery

## 2023-02-19 NOTE — Telephone Encounter (Signed)
Demetria Willamson called from [facility] called stating she accidentally cancelled pt appt for 9/5 @ 1:30pm for Dr. Christell Constant. Was unable to reschedule appt due to appt being taken. Advised Mrs. Willamson to call pt to let her know of the mistake and to call us and get her reschedule if it's ok. Mrs. Clinton Sawyer didn't understand why we could just reschedule appt in same slot. Explained to her we can not take another pt appt due to a mistake. Documenting this chart and forwarding to Toniann Fail May to see if we can reschedule pt. [facility] number called from [phone].

## 2023-02-20 ENCOUNTER — Telehealth: Payer: Self-pay | Admitting: *Deleted

## 2023-02-20 NOTE — Progress Notes (Signed)
  Care Coordination   Note   02/20/2023 Name: Lydia Graham MRN: [id number] DOB: 11/04/1935  Lydia Graham is a 87 y.o. year old female who sees [name], FNP for primary care. I reached out to [name] by phone today to offer care coordination services.  [name] was given information about Care Coordination services today including:   The Care Coordination services include support from the care team which includes your Nurse Coordinator, Clinical Social Worker, or Pharmacist.  The Care Coordination team is here to help remove barriers to the health concerns and goals most important to you. Care Coordination services are voluntary, and the patient may decline or stop services at any time by request to their care team member.   Care Coordination Consent Status: Patient did not agree to participate in care coordination services at this time.    Encounter Outcome:  Pt. Refused  [facility] Coordination Care Guide  Direct Dial: [phone]

## 2023-02-21 ENCOUNTER — Ambulatory Visit (HOSPITAL_COMMUNITY): Payer: Medicare Other | Admitting: Physical Therapy

## 2023-02-25 ENCOUNTER — Other Ambulatory Visit: Payer: Self-pay | Admitting: Cardiology

## 2023-02-27 ENCOUNTER — Ambulatory Visit (INDEPENDENT_AMBULATORY_CARE_PROVIDER_SITE_OTHER): Payer: Medicare Other | Admitting: Orthopedic Surgery

## 2023-02-27 ENCOUNTER — Telehealth: Payer: Self-pay | Admitting: Orthopedic Surgery

## 2023-02-27 ENCOUNTER — Ambulatory Visit: Payer: Medicare Other | Admitting: Orthopedic Surgery

## 2023-02-27 DIAGNOSIS — M7110 Other infective bursitis, unspecified site: Secondary | ICD-10-CM | POA: Diagnosis not present

## 2023-02-27 NOTE — Progress Notes (Signed)
Orthopedic Surgery Office Note     Assessment: Patient is a 87 y.o. female with left knee pain that continues to improve   Plan: -Operative plans: none at this time [facility] aspiration was negative for organism -Order ESR and CRP to trend today -Complete doxycycline course -Recommended continue icing to the left knee -Weight bearing status: as tolerated -Follow-up in 2 weeks for repeat check, x-rays needed at next visit: none   ___________________________________________________________________________   Subjective: Patient feels that her knee pain has continued to get better since she was last seen.  She is ambulating without assistive devices.  She has not had any fevers or chills.  She is not taking any medication during the day but takes a pain medication at night to help her sleep.  She feels that she has been able to be a little more active since she was last seen.    Physical Exam:   General: no acute distress, appears stated age Neurologic: sleeping but awakes to voice, answering questions appropriately, following commands Respiratory: unlabored breathing on room air, symmetric chest rise Psychiatric: appropriate affect, normal cadence to speech   MSK:    -Left lower extremity            Less TTP over the anterior aspect of the knee.  No other tenderness to palpation over the knee.  Ecchymosis seen over the anterior and posterior aspects of the knee is improving.  No drainage coming from the knee.  No pain through passive range of motion at the knee.  No erythema seen over the anterior aspect of the knee but still has ecchymosis and swelling over the patella EHL/TA/GSC intact Plantarflexes and dorsiflexes toes Sensation intact to light touch in sural, saphenous, tibial, deep peroneal, and superficial peroneal nerve distributions Foot warm and well perfused     Patient name: Lydia Graham Patient MRN: [id number] Date: 02/27/23

## 2023-02-27 NOTE — Telephone Encounter (Signed)
Scheduled for 03/13/23  @ 215

## 2023-02-27 NOTE — Telephone Encounter (Signed)
Patient was  trying to schedule and he wanted her to come back in two weeks but there's no opening. She wants you to call to see what you can do? HC#[phone]

## 2023-02-28 ENCOUNTER — Encounter: Payer: Self-pay | Admitting: Cardiovascular Disease

## 2023-02-28 ENCOUNTER — Ambulatory Visit: Payer: Medicare Other | Attending: Cardiovascular Disease | Admitting: Cardiovascular Disease

## 2023-02-28 VITALS — BP 138/70 | HR 62 | Ht 67.0 in | Wt 143.8 lb

## 2023-02-28 DIAGNOSIS — I480 Paroxysmal atrial fibrillation: Secondary | ICD-10-CM | POA: Diagnosis not present

## 2023-02-28 DIAGNOSIS — Z79899 Other long term (current) drug therapy: Secondary | ICD-10-CM

## 2023-02-28 LAB — C-REACTIVE PROTEIN: CRP: <3 mg/L (ref <8.0)

## 2023-02-28 LAB — SEDIMENTATION RATE: Sed Rate: 9 mm/h (ref 0–30)

## 2023-02-28 NOTE — Patient Instructions (Addendum)
Medication Instructions:   Continue all current medications.   Labwork:  TSH, T4 - orders given Office will contact with results via phone, letter or mychart.     Testing/Procedures:  none  Follow-Up:  4 months   Any Other Special Instructions Will Be Listed Below (If Applicable).   If you need a refill on your cardiac medications before your next appointment, please call your pharmacy.

## 2023-02-28 NOTE — Progress Notes (Signed)
   PCP: [name], FNP   Primary EP: Dr Nelly Laurence  Lydia Graham is a 87 y.o. female who presents today for routine electrophysiology followup.    She was admitted to [facility] in August 2024 due to knee pain and a large hematoma after a fall.  During her hospitalization she was noted to have atrial fibrillation and had a 4-second pause.  This was not a postconversion pause.  Strips are available under the CV strips tab.  Beta-blocker was decreased.  Her amiodarone 100 mg daily was continued.   Today, she denies symptoms of palpitations, chest pain, shortness of breath,  lower extremity edema, dizziness, presyncope, or syncope.  The patient is otherwise without complaint today.   She has noticed for some tie now that she feels a little woozy or lightheaded in the morning when she first wakes up, particularly if she did not eat much the night before.  This sensation lasts until she has some food.  She has checked her blood pressure and her pulse rate during these times, and her BP typically is 110s to 120s over 70s, and her heart rate in the 60s.  She knows to avoid getting in the shower when she feels this way because hot water can exacerbate her symptoms.     Physical Exam: Vitals:   02/28/23 1457  BP: 138/70  Pulse: 62  SpO2: 97%  Weight: 143 lb 12.8 oz (65.2 kg)  Height: 5\' 7"  (1.702 m)     GEN- The patient is well appearing, alert and oriented x 3 today.   Head- normocephalic, atraumatic Eyes-  Sclera clear, conjunctiva pink Ears- hearing intact Oropharynx- clear Lungs- Clear to ausculation bilaterally, normal work of breathing Heart- Regular rate and rhythm, no murmurs, rubs or gallops, PMI not laterally displaced GI- soft, NT, ND, + BS Extremities- no clubbing, cyanosis, or edema  Wt Readings from Last 3 Encounters:  02/28/23 143 lb 12.8 oz (65.2 kg)  02/14/23 145 lb 1.9 oz (65.8 kg)  02/08/23 145 lb 8.1 oz (66 kg)    EKG tracing ordered today is personally  reviewed and shows sinus with LBBB  Assessment and Plan:  Persistent atrial fibrillation Doing well with amiodarone 100mg  QOD  She is on eliquis for stroke prevention (chads2vasc score is 5) Recent CMP reviewed. Will order TSH We discussed signs and symptoms that would require her to go to the ER or call us for expedited support.  2. HTN Stable No change required today Labs 07/28/20 reviewed  3. Chronic diastolic dysfunction Stable No change required today  4. S/p prior CEA for L ICA stenosis in 2018 Stable No change required today Follows with Vascular surgery  5. Moderate MR Follows with Dr Wyline Mood His notes reviewed MR mild by echo 03/28/22 (reviewed)  Risks, benefits and potential toxicities for medications prescribed and/or refilled reviewed with patient today.   Return in a year to see me Follow-up with Dr Wyline Mood in the interim  Maurice Small, MD 02/28/2023 3:38 PM

## 2023-03-05 ENCOUNTER — Other Ambulatory Visit (HOSPITAL_COMMUNITY)
Admission: RE | Admit: 2023-03-05 | Discharge: 2023-03-05 | Disposition: A | Discharge status: Home / Self Care | Payer: Medicare Other | Source: Ambulatory Visit | Attending: Cardiovascular Disease | Admitting: Cardiovascular Disease

## 2023-03-05 DIAGNOSIS — I480 Paroxysmal atrial fibrillation: Secondary | ICD-10-CM | POA: Diagnosis not present

## 2023-03-05 DIAGNOSIS — Z79899 Other long term (current) drug therapy: Secondary | ICD-10-CM | POA: Diagnosis not present

## 2023-03-05 LAB — TSH: TSH: 2.728 u[IU]/mL (ref 0.350–4.500)

## 2023-03-06 LAB — T4: T4, Total: 7.2 ug/dL (ref 4.5–12.0)

## 2023-03-10 ENCOUNTER — Ambulatory Visit: Payer: Medicare Other | Admitting: Physician Assistant

## 2023-03-13 ENCOUNTER — Ambulatory Visit (INDEPENDENT_AMBULATORY_CARE_PROVIDER_SITE_OTHER): Payer: Medicare Other | Admitting: Orthopedic Surgery

## 2023-03-13 DIAGNOSIS — M25562 Pain in left knee: Secondary | ICD-10-CM | POA: Diagnosis not present

## 2023-03-13 DIAGNOSIS — T148XXA Other injury of unspecified body region, initial encounter: Secondary | ICD-10-CM | POA: Diagnosis not present

## 2023-03-13 NOTE — Progress Notes (Signed)
Orthopedic Surgery Office Note     Assessment: Patient is a 87 y.o. female with left knee pain that continues to improve   Plan: -Operative plans: none at this time -ESR and CRP were within normal limits -No further antibiotics needed at this time -Can continue to ice the knee -Okay to use up to 1000 mg of Tylenol 3 times per day -Discussed some of the complications with incision and drainage of the hematoma collection.  Since her pain is getting better and I will take time for her body to reabsorb most of the hematoma, will continue to monitor at this time.  Patient was in agreement with that plan -Follow-up in 4 weeks for repeat check, x-rays needed at next visit: none   ___________________________________________________________________________   Subjective:  She feels that her knee pain has continued to improve since our last visit.  She still does get pain if she is sitting for too long or standing for too long.  She has learned to make adjustments that decreases the pain.  For example, when she is not sure she will put her leg up on the pew and that relieves it.  She is no longer taking any narcotics.  She is only using Tylenol to control her pain.     Physical Exam:   General: no acute distress, appears stated age Neurologic: sleeping but awakes to voice, answering questions appropriately, following commands Respiratory: unlabored breathing on room air, symmetric chest rise Psychiatric: appropriate affect, normal cadence to speech   MSK:    -Left lower extremity            Nontender to palpation over the knee.  Still has ecchymosis over the anterior knee but posterior ecchymosis has resolved.  There is still protuberance in the region of the patellar tendon that appears similar to last visit.  No drainage coming from the knee.  No pain through passive range of motion at the knee.  No erythema seen over the anterior aspect of the knee EHL/TA/GSC intact Plantarflexes and  dorsiflexes toes Sensation intact to light touch in sural, saphenous, tibial, deep peroneal, and superficial peroneal nerve distributions Foot warm and well perfused   Ordered and reviewed the CRP and ESR.  ESR was 9 down from 40.  CRP was under 3 down from 3.3.  Both her CRP and ESR were within normal limits.  Those most recent lab results were from 02/27/2023.  Patient name: Lydia Graham Patient MRN: [id number] Date: 03/13/23

## 2023-03-18 ENCOUNTER — Telehealth: Payer: Self-pay | Admitting: Cardiovascular Disease

## 2023-03-18 MED ORDER — METOPROLOL SUCCINATE ER 25 MG PO TB24: 25.0000 mg | ORAL_TABLET | Freq: Every day | ORAL | 2 refills | Status: DC

## 2023-03-18 NOTE — Telephone Encounter (Signed)
Filled until pt next appt

## 2023-03-18 NOTE — Telephone Encounter (Signed)
*  STAT* If patient is at the pharmacy, call can be transferred to refill team.   1. Which medications need to be refilled? (please list name of each medication and dose if known) metoprolol succinate (TOPROL-XL) 25 MG 24 hr tablet   2. Which pharmacy/location (including street and city if local pharmacy) is medication to be sent to?  [facility] [place], [place] - [address]    3. Do they need a 30 day or 90 day supply? 90

## 2023-03-19 ENCOUNTER — Ambulatory Visit (INDEPENDENT_AMBULATORY_CARE_PROVIDER_SITE_OTHER): Payer: Medicare Other | Admitting: Family Medicine

## 2023-03-19 ENCOUNTER — Encounter: Payer: Self-pay | Admitting: Family Medicine

## 2023-03-19 VITALS — BP 154/77 | HR 80 | Ht 67.0 in | Wt 144.0 lb

## 2023-03-19 DIAGNOSIS — L03116 Cellulitis of left lower limb: Secondary | ICD-10-CM

## 2023-03-19 DIAGNOSIS — I83893 Varicose veins of bilateral lower extremities with other complications: Secondary | ICD-10-CM

## 2023-03-19 MED ORDER — UNABLE TO FIND
1 refills | Status: AC
Start: 2023-03-19 — End: ?

## 2023-03-19 NOTE — Progress Notes (Signed)
Established Patient Office Visit  Subjective:  Patient ID: Lydia Graham, female    DOB: 1936-03-27  Age: 87 y.o. MRN: [id number]  CC:  Chief Complaint  Patient presents with   Follow-up    Follow up needs rx for stockings    HPI [name] Lydia Graham is a 87 y.o. female presents for f/u of cellulitis of left lower extremity.  Cellulitis of left lower extremity:The patient denies pain, redness, or warmth and reports that her symptoms have improved. She has completed a full course of antibiotics and has been following up with orthopedic surgery. She is requesting a prescription for compression stockings today.   Past Medical History:  Diagnosis Date   Arthritis    oa, all over - multiple areas    Carotid artery occlusion    Dysrhythmia    AFib    GERD (gastroesophageal reflux disease)    Headache    h/o migraines    Hypertension    PAF (paroxysmal atrial fibrillation) (HCC)    a. multiple DCCV's in 2019 and eventually required initiation of Amiodarone with successful DCCV after this. b. recurrent in 12/2021 while admitted for Urosepsis    Past Surgical History:  Procedure Laterality Date   ABDOMINAL HYSTERECTOMY     APPENDECTOMY     BACK SURGERY     CARDIOVERSION N/A 12/19/2017   Procedure: CARDIOVERSION;  Surgeon: Antoine Poche, MD;  Location: [facility];  Service: Endoscopy;  Laterality: N/A;   CARDIOVERSION N/A 12/26/2017   Procedure: CARDIOVERSION;  Surgeon: Chrystie Nose, MD;  Location: [facility] ENDOSCOPY;  Service: Cardiovascular;  Laterality: N/A;   CARDIOVERSION N/A 01/26/2018   Procedure: CARDIOVERSION;  Surgeon: Jake Bathe, MD;  Location: [facility] ENDOSCOPY;  Service: Cardiovascular;  Laterality: N/A;   CAROTID ENDARTERECTOMY     CATARACT EXTRACTION W/PHACO Right 03/09/2021   Procedure: CATARACT EXTRACTION PHACO AND INTRAOCULAR LENS PLACEMENT with Placement of Corticosteroid (IOC);  Surgeon: Fabio Pierce, MD;  Location: [facility] ORS;  Service: Ophthalmology;   Laterality: Right;  CDE 9.16   CATARACT EXTRACTION W/PHACO Left 04/02/2021   Procedure: CATARACT EXTRACTION PHACO AND INTRAOCULAR LENS PLACEMENT LEFT EYE;  Surgeon: Fabio Pierce, MD;  Location: [facility] ORS;  Service: Ophthalmology;  Laterality: Left;  left CDE=6.75   ENDARTERECTOMY Left 03/27/2017   Procedure: ENDARTERECTOMY CAROTID LEFT;  Surgeon: Maeola Harman, MD;  [facility] OR;  Service: Vascular;  Laterality: Left;   INNER EAR SURGERY Right    puntured eardrum- repaired 2x's    KNEE ARTHROPLASTY     NASAL SINUS SURGERY     deviated septum   PATCH ANGIOPLASTY Left 03/27/2017   Procedure: PATCH ANGIOPLASTY LEFT CAROTID ARTERY USING Livia Snellen BIOLOGIC PATCH;  Surgeon: Maeola Harman, MD;  Location: [facility] OR;  Service: Vascular;  Laterality: Left;   TONSILLECTOMY     TOTAL KNEE ARTHROPLASTY Right 10/23/2016   Procedure: RIGHT TOTAL KNEE ARTHROPLASTY;  Surgeon: Nadara Mustard, MD;  Location: [facility] OR;  Service: Orthopedics;  Laterality: Right;    Family History  Problem Relation Age of Onset   Stroke Mother    Rheum arthritis Sister     Social History   Socioeconomic History   Marital status: Single    Spouse name: Not on file   Number of children: Not on file   Years of education: Not on file   Highest education level: Not on file  Occupational History   Not on file  Tobacco Use   Smoking status: Never  Smokeless tobacco: Never  Vaping Use   Vaping status: Never Used  Substance and Sexual Activity   Alcohol use: No   Drug use: No   Sexual activity: Not Currently    Birth control/protection: Surgical  Other Topics Concern   Not on file  Social History Narrative   Not on file   Social Determinants of Health   Financial Resource Strain: Low Risk  (06/28/2022)   Overall Financial Resource Strain (CARDIA)    Difficulty of Paying Living Expenses: Not hard at all  Food Insecurity: No Food Insecurity (02/13/2023)   Hunger Vital Sign    Worried About Running  Out of Food in the Last Year: Never true    Ran Out of Food in the Last Year: Never true  Transportation Needs: No Transportation Needs (06/28/2022)   PRAPARE - Administrator, Civil Service (Medical): No    Lack of Transportation (Non-Medical): No  Physical Activity: Sufficiently Active (06/28/2022)   Exercise Vital Sign    Days of Exercise per Week: 3 days    Minutes of Exercise per Session: 50 min  Stress: No Stress Concern Present (06/28/2022)   [employer] of Occupational Health - Occupational Stress Questionnaire    Feeling of Stress : Not at all  Social Connections: Moderately Integrated (06/28/2022)   Social Connection and Isolation Panel [NHANES]    Frequency of Communication with Friends and Family: More than three times a week    Frequency of Social Gatherings with Friends and Family: More than three times a week    Attends Religious Services: More than 4 times per year    Active Member of [employer] or Organizations: Yes    Attends Banker Meetings: More than 4 times per year    Marital Status: Widowed  Intimate Partner Violence: Not At Risk (06/28/2022)   Humiliation, Afraid, Rape, and Kick questionnaire    Fear of Current or Ex-Partner: No    Emotionally Abused: No    Physically Abused: No    Sexually Abused: No    Outpatient Medications Prior to Visit  Medication Sig Dispense Refill   amiodarone (PACERONE) 200 MG tablet Take 0.5 tablets (100 mg total) by mouth every other day. 45 tablet 3   amLODipine (NORVASC) 5 MG tablet take 1 tablet at 8am and 1 tablet at 8pm. 60 tablet 4   apixaban (ELIQUIS) 5 MG TABS tablet Take 1 tablet (5 mg total) by mouth 2 (two) times daily. 60 tablet 5   Calcium Carb-Cholecalciferol (CALCIUM + VITAMIN D3 PO) Take 1 tablet by mouth daily.     Camphor-Menthol-Methyl Sal (SALONPAS) 3.06-29-08 % PTCH Place 1 patch onto the skin at bedtime as needed (foot pain).     Cyanocobalamin (VITAMIN B-12 PO) Take 1 tablet by mouth daily.      estradiol (ESTRACE) 0.5 MG tablet Take 0.5 mg by mouth daily.      furosemide (LASIX) 20 MG tablet Take 1 tablet (20 mg total) by mouth as needed for fluid or edema. 30 tablet 2   Hypromellose (ARTIFICIAL TEARS OP) Place 2 drops into both eyes 2 (two) times daily as needed (for dry eyes).     losartan (COZAAR) 50 MG tablet take 1 tablet at 10 IN THE MORNING and 1 tablet at 10 pm. 60 tablet 9   MAGNESIUM PO Take 1 capsule by mouth every evening.     metoprolol succinate (TOPROL-XL) 25 MG 24 hr tablet Take 1 tablet (25 mg total) by mouth  daily. 30 tablet 2   oxyCODONE (OXY IR/ROXICODONE) 5 MG immediate release tablet Take 1 tablet (5 mg total) by mouth every 6 (six) hours as needed for moderate pain or severe pain. 30 tablet 0   pantoprazole (PROTONIX) 40 MG tablet TAKE (1) TABLET TWICE DAILY. 60 tablet 6   No facility-administered medications prior to visit.    Allergies  Allergen Reactions   Crestor [Rosuvastatin] Other (See Comments)    Myalgias   Other Other (See Comments)    Hypotension caused by pain medication/anesthesia   Singulair [Montelukast Sodium] Other (See Comments)    Fatigue    Zestril [Lisinopril] Cough    ROS Review of Systems  Constitutional:  Negative for chills and fever.  Eyes:  Negative for visual disturbance.  Respiratory:  Negative for chest tightness and shortness of breath.   Neurological:  Negative for dizziness and headaches.      Objective:    Physical Exam HENT:     Head: Normocephalic.     Mouth/Throat:     Mouth: Mucous membranes are moist.  Cardiovascular:     Rate and Rhythm: Normal rate.     Heart sounds: Normal heart sounds.  Pulmonary:     Effort: Pulmonary effort is normal.     Breath sounds: Normal breath sounds.  Neurological:     Mental Status: She is alert.     BP (!) 154/77 (BP Location: Left Arm, Patient Position: Sitting, Cuff Size: Large)   Pulse 80   Ht 5\' 7"  (1.702 m)   Wt 144 lb 0.6 oz (65.3 kg)   LMP  (LMP  Unknown)   SpO2 91%   BMI 22.56 kg/m  Wt Readings from Last 3 Encounters:  03/19/23 144 lb 0.6 oz (65.3 kg)  02/28/23 143 lb 12.8 oz (65.2 kg)  02/14/23 145 lb 1.9 oz (65.8 kg)    Lab Results  Component Value Date   TSH 2.728 03/05/2023   Lab Results  Component Value Date   WBC 11.5 (H) 02/14/2023   HGB 11.5 02/14/2023   HCT 36.3 02/14/2023   MCV 88 02/14/2023   PLT 510 (H) 02/14/2023   Lab Results  Component Value Date   NA 131 (L) 02/10/2023   K 4.5 02/10/2023   CO2 20 (L) 02/10/2023   GLUCOSE 100 (H) 02/10/2023   BUN 15 02/10/2023   CREATININE 0.87 02/10/2023   BILITOT 0.6 02/10/2023   ALKPHOS 63 02/10/2023   AST 16 02/10/2023   ALT 11 02/10/2023   PROT 6.4 (L) 02/10/2023   ALBUMIN 3.0 (L) 02/10/2023   CALCIUM 8.7 (L) 02/10/2023   ANIONGAP 11 02/10/2023   EGFR 43 (L) 09/06/2022   Lab Results  Component Value Date   CHOL 185 09/06/2022   Lab Results  Component Value Date   HDL 49 09/06/2022   Lab Results  Component Value Date   LDLCALC 114 (H) 09/06/2022   Lab Results  Component Value Date   TRIG 120 09/06/2022   Lab Results  Component Value Date   CHOLHDL 3.8 09/06/2022   Lab Results  Component Value Date   HGBA1C 5.6 09/06/2022      Assessment & Plan:  Cellulitis of left lower extremity Assessment & Plan: Resolved Encouraged to continue to follow orthopedic surgery as scheduled   Varicose veins of both legs with edema -     UNABLE TO FIND; Med Name: Mediven Comfort calf medical compression stocking 30-40 ICD: I83.893  Dispense: 1 each; Refill: 1  Note:  This chart has been completed using Engineer, civil (consulting) software, and while attempts have been made to ensure accuracy, certain words and phrases may not be transcribed as intended.    Follow-up: Return in about 4 months (around 07/19/2023).   [name], FNP

## 2023-03-19 NOTE — Patient Instructions (Addendum)
I appreciate the opportunity to provide care to you today!    Follow up:  4 months   Attached with your AVS, you will find valuable resources for self-education. I highly recommend dedicating some time to thoroughly examine them.   Please continue to a heart-healthy diet and increase your physical activities. Try to exercise for at least five days a week.    It was a pleasure to see you and I look forward to continuing to work together on your health and well-being. Please do not hesitate to call the office if you need care or have questions about your care.  In case of emergency, please visit the Emergency Department for urgent care, or contact our clinic at [phone] to schedule an appointment. We're here to help you!   Have a wonderful day and week. With Gratitude, [name] MSN, FNP-BC

## 2023-03-19 NOTE — Assessment & Plan Note (Signed)
Resolved Encouraged to continue to follow orthopedic surgery as scheduled

## 2023-03-26 ENCOUNTER — Ambulatory Visit (HOSPITAL_COMMUNITY): Payer: Medicare Other

## 2023-03-28 ENCOUNTER — Ambulatory Visit (HOSPITAL_COMMUNITY): Payer: Medicare Other | Admitting: Physical Therapy

## 2023-03-31 ENCOUNTER — Ambulatory Visit (HOSPITAL_COMMUNITY): Payer: Medicare Other

## 2023-04-01 ENCOUNTER — Other Ambulatory Visit: Payer: Self-pay

## 2023-04-01 ENCOUNTER — Emergency Department (HOSPITAL_COMMUNITY): Payer: Medicare Other

## 2023-04-01 ENCOUNTER — Emergency Department (HOSPITAL_COMMUNITY)
Admission: EM | Admit: 2023-04-01 | Discharge: 2023-04-01 | Disposition: A | Discharge status: Home / Self Care | Payer: Medicare Other | Attending: Emergency Medicine | Admitting: Emergency Medicine

## 2023-04-01 DIAGNOSIS — Z79899 Other long term (current) drug therapy: Secondary | ICD-10-CM | POA: Insufficient documentation

## 2023-04-01 DIAGNOSIS — I509 Heart failure, unspecified: Secondary | ICD-10-CM | POA: Insufficient documentation

## 2023-04-01 DIAGNOSIS — R918 Other nonspecific abnormal finding of lung field: Secondary | ICD-10-CM | POA: Diagnosis not present

## 2023-04-01 DIAGNOSIS — R002 Palpitations: Secondary | ICD-10-CM | POA: Insufficient documentation

## 2023-04-01 DIAGNOSIS — Z7901 Long term (current) use of anticoagulants: Secondary | ICD-10-CM | POA: Diagnosis not present

## 2023-04-01 DIAGNOSIS — R079 Chest pain, unspecified: Secondary | ICD-10-CM | POA: Diagnosis not present

## 2023-04-01 DIAGNOSIS — I110 Hypertensive heart disease with heart failure: Secondary | ICD-10-CM | POA: Diagnosis not present

## 2023-04-01 DIAGNOSIS — Z96651 Presence of right artificial knee joint: Secondary | ICD-10-CM | POA: Insufficient documentation

## 2023-04-01 DIAGNOSIS — I700 Atherosclerosis of aorta: Secondary | ICD-10-CM | POA: Diagnosis not present

## 2023-04-01 LAB — BASIC METABOLIC PANEL
Anion gap: 9 (ref 5–15)
BUN: 20 mg/dL (ref 8–23)
CO2: 23 mmol/L (ref 22–32)
Calcium: 9.2 mg/dL (ref 8.9–10.3)
Chloride: 99 mmol/L (ref 98–111)
Creatinine, Ser: 1.05 mg/dL — ABNORMAL HIGH (ref 0.44–1.00)
GFR, Estimated: 52 mL/min — ABNORMAL LOW (ref >60)
Glucose, Bld: 113 mg/dL — ABNORMAL HIGH (ref 70–99)
Potassium: 4.1 mmol/L (ref 3.5–5.1)
Sodium: 131 mmol/L — ABNORMAL LOW (ref 135–145)

## 2023-04-01 LAB — CBC
HCT: 40.4 % (ref 36.0–46.0)
Hemoglobin: 13.4 g/dL (ref 12.0–15.0)
MCH: 29.3 pg (ref 26.0–34.0)
MCHC: 33.2 g/dL (ref 30.0–36.0)
MCV: 88.4 fL (ref 80.0–100.0)
Platelets: 375 10*3/uL (ref 150–400)
RBC: 4.57 MIL/uL (ref 3.87–5.11)
RDW: 14.2 % (ref 11.5–15.5)
WBC: 8.4 10*3/uL (ref 4.0–10.5)
nRBC: 0 % (ref 0.0–0.2)

## 2023-04-01 LAB — TROPONIN I (HIGH SENSITIVITY)
Troponin I (High Sensitivity): 6 ng/L (ref <18)
Troponin I (High Sensitivity): 9 ng/L (ref <18)

## 2023-04-01 NOTE — ED Provider Notes (Signed)
[place] EMERGENCY DEPARTMENT AT [facility] Provider Note  CSN: [id number] Arrival date & time: 04/01/23 1721  Chief Complaint(s) Palpitations and Chest Pain  HPI Lydia Graham is a 87 y.o. female history of A-fib on Eliquis, CHF presenting to the emergency department with episode of palpitations.  Patient reports that she woke up around 330 after a nap and had palpitations.  She denies any chest pain.  She was not lightheaded, dizziness.  She did not syncopized.  No recent other symptoms like fevers or chills, chest pain, abdominal pain, nausea, vomiting, leg swelling.  She took a dose of her amiodarone, was planning to wait to see if it went away but family member encouraged her to come to the emergency department.  She reports that currently she has no symptoms and feels at baseline.  She has follow-up with her cardiologist in 2 days.   Past Medical History Past Medical History:  Diagnosis Date   Arthritis    oa, all over - multiple areas    Carotid artery occlusion    Dysrhythmia    AFib    GERD (gastroesophageal reflux disease)    Headache    h/o migraines    Hypertension    PAF (paroxysmal atrial fibrillation) (HCC)    a. multiple DCCV's in 2019 and eventually required initiation of Amiodarone with successful DCCV after this. b. recurrent in 12/2021 while admitted for Urosepsis   Patient Active Problem List   Diagnosis Date Noted   Cellulitis of left lower extremity 02/15/2023   Sinus pause 02/11/2023   LBBB (left bundle branch block) 02/11/2023   Knee pain 02/09/2023   Cellulitis 02/09/2023   Bursitis 02/08/2023   Normocytic anemia 02/08/2023   Thyroid nodule greater than or equal to 1.5 cm in diameter incidentally noted on imaging study 09/03/2022   Myofascial neck pain 09/03/2022   Lumbar radiculopathy 05/08/2022   Hyperlipidemia 05/08/2022   Cellulitis of right forearm 01/22/2022   Sepsis secondary to UTI (HCC) 12/26/2021   Acute pyelonephritis  12/25/2021   Acute metabolic encephalopathy 12/25/2021   Sepsis (HCC) 12/25/2021   AKI (acute kidney injury) (HCC) 12/25/2021   Hyponatremia 12/25/2021   Dehydration 12/25/2021   Atrial fibrillation, chronic (HCC) 12/25/2021   Essential hypertension 12/25/2021   GERD (gastroesophageal reflux disease) 12/25/2021   Acquired trigger finger of left middle finger 08/04/2020   Arthritis of hand 08/04/2020   Osteoarthritis of metacarpophalangeal (MCP) joint 08/03/2020   Bilateral hand pain 06/22/2020   Osteoarthritis of hands, 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   Osteoarthritis of feet, 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   PAF (paroxysmal atrial fibrillation) (HCC)    Chronic cough 12/29/2017   Atrial fibrillation with rapid ventricular response (HCC) 12/23/2017   Acute on chronic combined systolic and diastolic CHF (congestive heart failure) (HCC)    Menopausal symptom 10/17/2017   Menopausal syndrome 10/17/2017   Carotid stenosis 03/27/2017   S/P total knee arthroplasty, right 10/23/2016   Unilateral primary osteoarthritis, right knee 07/13/2016   Home Medication(s) Prior to Admission medications   Medication Sig Start Date End Date Taking? Authorizing Provider  amiodarone (PACERONE) 200 MG tablet Take 0.5 tablets (100 mg total) by mouth every other day. 10/04/22   Mealor, Roberts Gaudy, MD  amLODipine (NORVASC) 5 MG tablet take 1 tablet at 8am and 1 tablet at 8pm. 02/25/23   Branch, Dorothe Pea, MD  apixaban (ELIQUIS) 5 MG TABS tablet Take 1 tablet (5 mg total) by mouth 2 (two) times daily. 09/10/22   Mealor, Roberts Gaudy, MD  Calcium Carb-Cholecalciferol (CALCIUM + VITAMIN D3 PO) Take 1 tablet by mouth daily.    [provider]  Camphor-Menthol-Methyl Sal (SALONPAS) 3.06-29-08 % PTCH Place 1 patch onto the skin at bedtime as needed (foot pain).    [provider]  Cyanocobalamin (VITAMIN B-12 PO) Take 1 tablet by mouth daily.    [provider]  estradiol (ESTRACE) 0.5 MG tablet Take 0.5 mg by  mouth daily.  04/18/16   [provider]  furosemide (LASIX) 20 MG tablet Take 1 tablet (20 mg total) by mouth as needed for fluid or edema. 05/03/22   Jodelle Gross, NP  Hypromellose (ARTIFICIAL TEARS OP) Place 2 drops into both eyes 2 (two) times daily as needed (for dry eyes).    [provider]  losartan (COZAAR) 50 MG tablet take 1 tablet at 10 IN THE MORNING and 1 tablet at 10 pm. 11/06/22   Antoine Poche, MD  MAGNESIUM PO Take 1 capsule by mouth every evening.    [provider]  metoprolol succinate (TOPROL-XL) 25 MG 24 hr tablet Take 1 tablet (25 mg total) by mouth daily. 03/18/23   Antoine Poche, MD  oxyCODONE (OXY IR/ROXICODONE) 5 MG immediate release tablet Take 1 tablet (5 mg total) by mouth every 6 (six) hours as needed for moderate pain or severe pain. 02/12/23   Azucena Fallen, MD  pantoprazole (PROTONIX) 40 MG tablet TAKE (1) TABLET TWICE DAILY. 07/26/22   Antoine Poche, MD  UNABLE TO FIND Med Name: Mediven Comfort calf medical compression stocking 30-40 ICD: I83.893 03/19/23   [name], FNP                                                                                                                                    Past Surgical History Past Surgical History:  Procedure Laterality Date   ABDOMINAL HYSTERECTOMY     APPENDECTOMY     BACK SURGERY     CARDIOVERSION N/A 12/19/2017   Procedure: CARDIOVERSION;  Surgeon: Antoine Poche, MD;  Location: [facility] ENDO SUITE;  Service: Endoscopy;  Laterality: N/A;   CARDIOVERSION N/A 12/26/2017   Procedure: CARDIOVERSION;  Surgeon: Chrystie Nose, MD;  Location: [facility] ENDOSCOPY;  Service: Cardiovascular;  Laterality: N/A;   CARDIOVERSION N/A 01/26/2018   Procedure: CARDIOVERSION;  Surgeon: Jake Bathe, MD;  Location: [facility] ENDOSCOPY;  Service: Cardiovascular;  Laterality: N/A;   CAROTID ENDARTERECTOMY     CATARACT EXTRACTION W/PHACO Right 03/09/2021   Procedure: CATARACT EXTRACTION  PHACO AND INTRAOCULAR LENS PLACEMENT with Placement of Corticosteroid (IOC);  Surgeon: Fabio Pierce, MD;  Location: [facility] ORS;  Service: Ophthalmology;  Laterality: Right;  CDE 9.16   CATARACT EXTRACTION W/PHACO Left 04/02/2021   Procedure: CATARACT EXTRACTION PHACO AND INTRAOCULAR LENS PLACEMENT LEFT EYE;  Surgeon: Fabio Pierce, MD;  Location: [facility] ORS;  Service: Ophthalmology;  Laterality: Left;  left CDE=6.75   ENDARTERECTOMY Left 03/27/2017  Procedure: ENDARTERECTOMY CAROTID LEFT;  Surgeon: Maeola Harman, MD;  Location: [facility] OR;  Service: Vascular;  Laterality: Left;   INNER EAR SURGERY Right    puntured eardrum- repaired 2x's    KNEE ARTHROPLASTY     NASAL SINUS SURGERY     deviated septum   PATCH ANGIOPLASTY Left 03/27/2017   Procedure: PATCH ANGIOPLASTY LEFT CAROTID ARTERY USING Livia Snellen BIOLOGIC PATCH;  Surgeon: Maeola Harman, MD;  Location: [facility] OR;  Service: Vascular;  Laterality: Left;   TONSILLECTOMY     TOTAL KNEE ARTHROPLASTY Right 10/23/2016   Procedure: RIGHT TOTAL KNEE ARTHROPLASTY;  Surgeon: Nadara Mustard, MD;  Location: [facility] OR;  Service: Orthopedics;  Laterality: Right;   Family History Family History  Problem Relation Age of Onset   Stroke Mother    Rheum arthritis Sister     Social History Social History   Tobacco Use   Smoking status: Never   Smokeless tobacco: Never  Vaping Use   Vaping status: Never Used  Substance Use Topics   Alcohol use: No   Drug use: No   Allergies Crestor [rosuvastatin], Other, Singulair [montelukast sodium], and Zestril [lisinopril]  Review of Systems Review of Systems  All other systems reviewed and are negative.   Physical Exam Vital Signs  I have reviewed the triage vital signs BP (!) 149/66   Pulse 64   Temp 98.4 F (36.9 C) (Oral)   Resp 16   Ht 5\' 7"  (1.702 m)   Wt 65 kg   LMP  (LMP Unknown)   SpO2 96%   BMI 22.44 kg/m  Physical Exam Vitals and nursing note reviewed.  Constitutional:       General: She is not in acute distress.    Appearance: She is well-developed.  HENT:     Head: Normocephalic and atraumatic.     Mouth/Throat:     Mouth: Mucous membranes are moist.  Eyes:     Pupils: Pupils are equal, round, and reactive to light.  Cardiovascular:     Rate and Rhythm: Normal rate and regular rhythm.     Heart sounds: No murmur heard. Pulmonary:     Effort: Pulmonary effort is normal. No respiratory distress.     Breath sounds: Normal breath sounds.  Abdominal:     General: Abdomen is flat.     Palpations: Abdomen is soft.     Tenderness: There is no abdominal tenderness.  Musculoskeletal:        General: No tenderness.     Right lower leg: No edema.     Left lower leg: No edema.  Skin:    General: Skin is warm and dry.  Neurological:     General: No focal deficit present.     Mental Status: She is alert. Mental status is at baseline.  Psychiatric:        Mood and Affect: Mood normal.        Behavior: Behavior normal.     ED Results and Treatments Labs (all labs ordered are listed, but only abnormal results are displayed) Labs Reviewed  BASIC METABOLIC PANEL - Abnormal; Notable for the following components:      Result Value   Sodium 131 (*)    Glucose, Bld 113 (*)    Creatinine, Ser 1.05 (*)    GFR, Estimated 52 (*)    All other components within normal limits  CBC  TROPONIN I (HIGH SENSITIVITY)  TROPONIN I (HIGH SENSITIVITY)  Radiology DG Chest 2 View  Result Date: 04/01/2023 CLINICAL DATA:  Chest pain. EXAM: CHEST - 2 VIEW COMPARISON:  01/02/2022 FINDINGS: Patient rotation again noted. Upper normal heart size. Aortic atherosclerosis. Subsegmental atelectasis or scarring in the lingula and right middle lobe. Resolved pleural effusions from prior exam. No pneumothorax. No acute airspace disease. IMPRESSION: No acute findings.  Subsegmental atelectasis or scarring in the lingula and right middle lobe. Electronically Signed   By: Narda Rutherford M.D.   On: 04/01/2023 18:39    Pertinent labs & imaging results that were available during my care of the patient were reviewed by me and considered in my medical decision making (see MDM for details).  Medications Ordered in ED Medications - No data to display                                                                                                                                   Procedures Procedures  (including critical care time)  Medical Decision Making / ED Course   MDM:  87 year old presenting to the emergency department with palpitations.  Patient overall is very well-appearing.  Currently in sinus rhythm on monitor.  EKG shows sinus rhythm.  She did check her heart rate with blood pressure cuff and it showed 170.  Suspect an episode of A-fib with RVR.  She took home dose of amiodarone which probably terminated this.  EKG currently shows normal sinus rhythm.  She feels at baseline.  Troponin was checked and negative x 2.  Unclear why she developed A-fib with RVR.  Appears previously had recurrent A-fib with RVR, last had in the setting of infection, but patient denies any infectious symptoms at this time like fevers or chills, dysuria, chest pain, shortness of breath.  Labs reassuring.  No leukocytosis.  No fevers or tachycardia on vitals.  Since she feels at baseline, will discharge.  She has close follow-up with cardiologist in 2 days.  Discussed strict return precautions for any recurrent symptoms.  Will discharge patient to home. All questions answered. Patient comfortable with plan of discharge. Return precautions discussed with patient and specified on the after visit summary.      Additional history obtained:  -External records from outside source obtained and reviewed including: Chart review including previous notes, labs, imaging, consultation  notes including prior cardiology notes    Lab Tests: -I ordered, reviewed, and interpreted labs.   The pertinent results include:   Labs Reviewed  BASIC METABOLIC PANEL - Abnormal; Notable for the following components:      Result Value   Sodium 131 (*)    Glucose, Bld 113 (*)    Creatinine, Ser 1.05 (*)    GFR, Estimated 52 (*)    All other components within normal limits  CBC  TROPONIN I (HIGH SENSITIVITY)  TROPONIN I (HIGH SENSITIVITY)    Notable for negative troponin x2  EKG   EKG Interpretation Date/Time:  Tuesday April 01 2023 17:35:48 EDT Ventricular Rate:  88 PR Interval:  192 QRS Duration:  132 QT Interval:  376 QTC Calculation: 454 R Axis:   -50  Text Interpretation: Normal sinus rhythm Left axis deviation Left bundle branch block Abnormal ECG When compared with ECG of 28-Feb-2023 15:04, No significant change was found Confirmed by Alvino Blood ([id number]) on 04/01/2023 8:41:47 PM         Imaging Studies ordered: I ordered imaging studies including CXR On my interpretation imaging demonstrates no acute process I independently visualized and interpreted imaging. I agree with the radiologist interpretation   Medicines ordered and prescription drug management: No orders of the defined types were placed in this encounter.   -I have reviewed the patients home medicines and have made adjustments as needed    Cardiac Monitoring: The patient was maintained on a cardiac monitor.  I personally viewed and interpreted the cardiac monitored which showed an underlying rhythm of: NSR  Social Determinants of Health:  Diagnosis or treatment significantly limited by social determinants of health: lives alone   Reevaluation: After the interventions noted above, I reevaluated the patient and found that their symptoms have resolved  Co morbidities that complicate the patient evaluation  Past Medical History:  Diagnosis Date   Arthritis    oa, all over -  multiple areas    Carotid artery occlusion    Dysrhythmia    AFib    GERD (gastroesophageal reflux disease)    Headache    h/o migraines    Hypertension    PAF (paroxysmal atrial fibrillation) (HCC)    a. multiple DCCV's in 2019 and eventually required initiation of Amiodarone with successful DCCV after this. b. recurrent in 12/2021 while admitted for Urosepsis      Dispostion: Disposition decision including need for hospitalization was considered, and patient discharged from emergency department.    Final Clinical Impression(s) / ED Diagnoses Final diagnoses:  Palpitations     This chart was dictated using voice recognition software.  Despite best efforts to proofread,  errors can occur which can change the documentation meaning.    Lonell Grandchild, MD 04/01/23 2101

## 2023-04-01 NOTE — Discharge Instructions (Signed)
We evaluated you for your palpitations.  Your testing including your cardiac enzymes tests were normal.  Please mention this episode to your cardiologist.  Since your heart rate was so elevated, you probably were in A-fib.  If you have any new episodes, chest pain, lightheadedness or dizziness, fainting, or any other new symptoms, please return to the emergency department.

## 2023-04-01 NOTE — ED Triage Notes (Signed)
Pt states she woke from nap around 330pm and heart was racing 176bpm. Pt took amiodarone and amlodipine. Pt denies any chest pain or palipations at this time.

## 2023-04-02 ENCOUNTER — Ambulatory Visit (HOSPITAL_COMMUNITY): Payer: Medicare Other

## 2023-04-03 ENCOUNTER — Encounter: Payer: Self-pay | Admitting: Cardiology

## 2023-04-03 ENCOUNTER — Ambulatory Visit: Payer: Medicare Other | Attending: Cardiology | Admitting: Cardiology

## 2023-04-03 VITALS — BP 130/70 | HR 65 | Ht 67.0 in | Wt 145.6 lb

## 2023-04-03 DIAGNOSIS — D6869 Other thrombophilia: Secondary | ICD-10-CM | POA: Diagnosis not present

## 2023-04-03 DIAGNOSIS — I480 Paroxysmal atrial fibrillation: Secondary | ICD-10-CM | POA: Diagnosis not present

## 2023-04-03 DIAGNOSIS — I340 Nonrheumatic mitral (valve) insufficiency: Secondary | ICD-10-CM

## 2023-04-03 DIAGNOSIS — I10 Essential (primary) hypertension: Secondary | ICD-10-CM

## 2023-04-03 NOTE — Progress Notes (Signed)
Clinical Summary Lydia Graham is a 87 y.o.female seen today for follow up of the following medical problems.   1.Afib - followed by EP Dr Nelly Laurence - has been on amio 100mg  qod    04/01/23 ER visit with palpitations - home HRs reported at 170, by ER evaluation was already back in SR - K 4.1, ER EKG showed SR LBBB - felt heart go back into rhythm while in ER - only other episode few months ago. No recurrences - no bleeding on eliquis.     2.Systolic dysfunction 12/2021 echo LVEF 45-50%, no MR. This was in setting of admission with sepsis, afib with RVR 03/2022 echo LVEF 55-60%, no WMAs, grade II dd       3. HTN - home bp's120s-130s/60s, rare 140s.      4. Bilateral LE edema - no recent issues   5. Carotid stenosis - prior CEA - 05/2021 study 1-39% bilateral disease 05/2022 RICA 1-39%, LICA 1-39% - followed by vascular   6. Mitral regurgitation - moderate by echo 2019 12/2021 echo LVEF 45-50%, no MR - 03/2022 echo LVEF 55-60%, mild MR Past Medical History:  Diagnosis Date   Arthritis    oa, all over - multiple areas    Carotid artery occlusion    Dysrhythmia    AFib    GERD (gastroesophageal reflux disease)    Headache    h/o migraines    Hypertension    PAF (paroxysmal atrial fibrillation) (HCC)    a. multiple DCCV's in 2019 and eventually required initiation of Amiodarone with successful DCCV after this. b. recurrent in 12/2021 while admitted for Urosepsis     Allergies  Allergen Reactions   Crestor [Rosuvastatin] Other (See Comments)    Myalgias   Other Other (See Comments)    Hypotension caused by pain medication/anesthesia   Singulair [Montelukast Sodium] Other (See Comments)    Fatigue    Zestril [Lisinopril] Cough     Current Outpatient Medications  Medication Sig Dispense Refill   amiodarone (PACERONE) 200 MG tablet Take 0.5 tablets (100 mg total) by mouth every other day. 45 tablet 3   amLODipine (NORVASC) 5 MG tablet take 1 tablet at 8am  and 1 tablet at 8pm. 60 tablet 4   apixaban (ELIQUIS) 5 MG TABS tablet Take 1 tablet (5 mg total) by mouth 2 (two) times daily. 60 tablet 5   Calcium Carb-Cholecalciferol (CALCIUM + VITAMIN D3 PO) Take 1 tablet by mouth daily.     Camphor-Menthol-Methyl Sal (SALONPAS) 3.06-29-08 % PTCH Place 1 patch onto the skin at bedtime as needed (foot pain).     Cyanocobalamin (VITAMIN B-12 PO) Take 1 tablet by mouth daily.     estradiol (ESTRACE) 0.5 MG tablet Take 0.5 mg by mouth daily.      furosemide (LASIX) 20 MG tablet Take 1 tablet (20 mg total) by mouth as needed for fluid or edema. 30 tablet 2   Hypromellose (ARTIFICIAL TEARS OP) Place 2 drops into both eyes 2 (two) times daily as needed (for dry eyes).     losartan (COZAAR) 50 MG tablet take 1 tablet at 10 IN THE MORNING and 1 tablet at 10 pm. 60 tablet 9   MAGNESIUM PO Take 1 capsule by mouth every evening.     metoprolol succinate (TOPROL-XL) 25 MG 24 hr tablet Take 1 tablet (25 mg total) by mouth daily. 30 tablet 2   oxyCODONE (OXY IR/ROXICODONE) 5 MG immediate release tablet Take 1 tablet (5  mg total) by mouth every 6 (six) hours as needed for moderate pain or severe pain. 30 tablet 0   pantoprazole (PROTONIX) 40 MG tablet TAKE (1) TABLET TWICE DAILY. 60 tablet 6   UNABLE TO FIND Med Name: Mediven Comfort calf medical compression stocking 30-40 ICD: I83.893 1 each 1   No current facility-administered medications for this visit.     Past Surgical History:  Procedure Laterality Date   ABDOMINAL HYSTERECTOMY     APPENDECTOMY     BACK SURGERY     CARDIOVERSION N/A 12/19/2017   Procedure: CARDIOVERSION;  Surgeon: Antoine Poche, MD;  Location: [facility];  Service: Endoscopy;  Laterality: N/A;   CARDIOVERSION N/A 12/26/2017   Procedure: CARDIOVERSION;  Surgeon: Chrystie Nose, MD;  Location: [facility] ENDOSCOPY;  Service: Cardiovascular;  Laterality: N/A;   CARDIOVERSION N/A 01/26/2018   Procedure: CARDIOVERSION;  Surgeon: Jake Bathe, MD;   Location: [facility] ENDOSCOPY;  Service: Cardiovascular;  Laterality: N/A;   CAROTID ENDARTERECTOMY     CATARACT EXTRACTION W/PHACO Right 03/09/2021   Procedure: CATARACT EXTRACTION PHACO AND INTRAOCULAR LENS PLACEMENT with Placement of Corticosteroid (IOC);  Surgeon: Fabio Pierce, MD;  Location: [facility] ORS;  Service: Ophthalmology;  Laterality: Right;  CDE 9.16   CATARACT EXTRACTION W/PHACO Left 04/02/2021   Procedure: CATARACT EXTRACTION PHACO AND INTRAOCULAR LENS PLACEMENT LEFT EYE;  Surgeon: Fabio Pierce, MD;  Location: [facility] ORS;  Service: Ophthalmology;  Laterality: Left;  left CDE=6.75   ENDARTERECTOMY Left 03/27/2017   Procedure: ENDARTERECTOMY CAROTID LEFT;  Surgeon: Maeola Harman, MD;  Location: [facility] OR;  Service: Vascular;  Laterality: Left;   INNER EAR SURGERY Right    puntured eardrum- repaired 2x's    KNEE ARTHROPLASTY     NASAL SINUS SURGERY     deviated septum   PATCH ANGIOPLASTY Left 03/27/2017   Procedure: PATCH ANGIOPLASTY LEFT CAROTID ARTERY USING Livia Snellen BIOLOGIC PATCH;  Surgeon: Maeola Harman, MD;  Location: [facility] OR;  Service: Vascular;  Laterality: Left;   TONSILLECTOMY     TOTAL KNEE ARTHROPLASTY Right 10/23/2016   Procedure: RIGHT TOTAL KNEE ARTHROPLASTY;  Surgeon: Nadara Mustard, MD;  Location: [facility] OR;  Service: Orthopedics;  Laterality: Right;     Allergies  Allergen Reactions   Crestor [Rosuvastatin] Other (See Comments)    Myalgias   Other Other (See Comments)    Hypotension caused by pain medication/anesthesia   Singulair [Montelukast Sodium] Other (See Comments)    Fatigue    Zestril [Lisinopril] Cough      Family History  Problem Relation Age of Onset   Stroke Mother    Rheum arthritis Sister      Social History Lydia Graham reports that she has never smoked. She has never used smokeless tobacco. Lydia Graham reports no history of alcohol use.   Review of Systems CONSTITUTIONAL: No weight loss, fever, chills, weakness or fatigue.   HEENT: Eyes: No visual loss, blurred vision, double vision or yellow sclerae.No hearing loss, sneezing, congestion, runny nose or sore throat.  SKIN: No rash or itching.  CARDIOVASCULAR: per hpi RESPIRATORY: No shortness of breath, cough or sputum.  GASTROINTESTINAL: No anorexia, nausea, vomiting or diarrhea. No abdominal pain or blood.  GENITOURINARY: No burning on urination, no polyuria NEUROLOGICAL: No headache, dizziness, syncope, paralysis, ataxia, numbness or tingling in the extremities. No change in bowel or bladder control.  MUSCULOSKELETAL: No muscle, back pain, joint pain or stiffness.  LYMPHATICS: No enlarged nodes. No history of splenectomy.  PSYCHIATRIC: No history of  depression or anxiety.  ENDOCRINOLOGIC: No reports of sweating, cold or heat intolerance. No polyuria or polydipsia.  Marland Kitchen   Physical Examination Today's Vitals   04/03/23 1043  BP: 130/70  Pulse: 65  SpO2: 98%  Weight: 145 lb 9.6 oz (66 kg)  Height: 5\' 7"  (1.702 m)   Body mass index is 22.8 kg/m.  Gen: resting comfortably, no acute distress HEENT: no scleral icterus, pupils equal round and reactive, no palptable cervical adenopathy,  CV: RRR, no m/rg, no jvd Resp: Clear to auscultation bilaterally GI: abdomen is soft, non-tender, non-distended, normal bowel sounds, no hepatosplenomegaly MSK: extremities are warm, no edema.  Skin: warm, no rash Neuro:  no focal deficits Psych: appropriate affect   Diagnostic Studies 02/2021 echo Global longitudinal strain was attempted.  IMPRESSIONS     1. Left ventricular ejection fraction, by estimation, is 65 to 70%. The  left ventricle has normal function. The left ventricle has no regional  wall motion abnormalities. Left ventricular diastolic parameters are  indeterminate.   2. Right ventricular systolic function is normal. The right ventricular  size is normal. There is mildly elevated pulmonary artery systolic  pressure.   3. Left atrial size was  mildly dilated.   4. The mitral valve is normal in structure. Trivial mitral valve  regurgitation. No evidence of mitral stenosis.   5. The aortic valve is tricuspid. Aortic valve regurgitation is not  visualized. No aortic stenosis is present.   6. The inferior vena cava is normal in size with greater than 50%  respiratory variability, suggesting right atrial pressure of 3 mmHg.   Comparison(s): Previous Echo was technically challenging due to A-fib. LV  EF was @50 %, with moderate MR and TR, and biatrial enlargement.     12/2021 echo    1. Left ventricular ejection fraction, by estimation, is 45 to 50%. The  left ventricle has mildly decreased function. The left ventricle has no  regional wall motion abnormalities. There is mild left ventricular  hypertrophy. Left ventricular diastolic  parameters are indeterminate.   2. Right ventricular systolic function is normal. The right ventricular  size is normal. There is mildly elevated pulmonary artery systolic  pressure.   3. Left atrial size was mildly dilated.   4. Right atrial size was mildly dilated.   5. The mitral valve is normal in structure. No evidence of mitral valve  regurgitation. No evidence of mitral stenosis.   6. The aortic valve is normal in structure. Aortic valve regurgitation is  not visualized. No aortic stenosis is present.   7. The inferior vena cava is normal in size with greater than 50%  respiratory variability, suggesting right atrial pressure of 3 mmHg.     05/2021 carotid US Summary:  Right Carotid: Velocities in the right ICA are consistent with a 1-39%  stenosis.   Left Carotid: Velocities in the left ICA are consistent with a 1-39%  stenosis.   Vertebrals:  Bilateral vertebral arteries demonstrate antegrade flow.  Subclavians: Normal flow hemodynamics were seen in bilateral subclavian               arteries.      03/2022 echo 1. Left ventricular ejection fraction, by estimation, is 55 to 60%.  The  left ventricle has normal function. The left ventricle has no regional  wall motion abnormalities. There is mild asymmetric left ventricular  hypertrophy of the basal segment. Left  ventricular diastolic parameters are consistent with Grade II diastolic  dysfunction (pseudonormalization). The average  left ventricular global  longitudinal strain is -19.8 %. The global longitudinal strain is normal.   2. Right ventricular systolic function is normal. The right ventricular  size is normal. There is mildly elevated pulmonary artery systolic  pressure. The estimated right ventricular systolic pressure is 40.7 mmHg.   3. Left atrial size was mildly dilated.   4. The mitral valve is grossly normal. Mild mitral valve regurgitation.   5. Tricuspid valve regurgitation is mild to moderate.   6. The aortic valve is tricuspid. Aortic valve regurgitation is not  visualized. Aortic valve sclerosis is present, with no evidence of aortic  valve stenosis.   7. The inferior vena cava is normal in size with greater than 50%  respiratory variability, suggesting right atrial pressure of 3 mmHg.       Assessment and Plan  Afib/acquired thrombophilia -isoalted episode of palpitations that self terminated, will continue current regimen and monitor at this time - continue eliquis for stroke prevention   2. HTN -bp's overall at goal, continue curren tmeds    3. Mitral regurgitation - mild by echo, would repeat echo 2026    F/u 6months  Antoine Poche, M.D.

## 2023-04-03 NOTE — Patient Instructions (Signed)
Medication Instructions:  Continue all current medications.  Labwork: none  Testing/Procedures: none  Follow-Up: 6 months   Any Other Special Instructions Will Be Listed Below (If Applicable).  If you need a refill on your cardiac medications before your next appointment, please call your pharmacy.  

## 2023-04-07 ENCOUNTER — Ambulatory Visit (HOSPITAL_COMMUNITY): Payer: Medicare Other | Admitting: Physical Therapy

## 2023-04-08 ENCOUNTER — Other Ambulatory Visit: Payer: Self-pay

## 2023-04-08 MED ORDER — APIXABAN 5 MG PO TABS: 5.0000 mg | ORAL_TABLET | Freq: Two times a day (BID) | ORAL | 5 refills | Status: DC

## 2023-04-08 NOTE — Telephone Encounter (Signed)
Prescription refill request for Eliquis received. Indication:afib Last office visit:10/24 Scr:1.05  10/24 Age: 87 Weight:66  kg  Prescription refilled

## 2023-04-10 ENCOUNTER — Ambulatory Visit (HOSPITAL_COMMUNITY): Payer: Medicare Other | Admitting: Physical Therapy

## 2023-04-11 ENCOUNTER — Ambulatory Visit (INDEPENDENT_AMBULATORY_CARE_PROVIDER_SITE_OTHER): Payer: Medicare Other | Admitting: Orthopedic Surgery

## 2023-04-11 ENCOUNTER — Telehealth: Payer: Self-pay | Admitting: Physical Medicine and Rehabilitation

## 2023-04-11 DIAGNOSIS — S8002XA Contusion of left knee, initial encounter: Secondary | ICD-10-CM

## 2023-04-11 NOTE — Telephone Encounter (Signed)
Spoke with patient and she is wanting to have the injections in her neck. Per Ellin Goodie, NP, patient was referred to Dr. Lorrine Kin as Dr. Alvester Morin does not perform those type of injection. Patient stated she would call that office

## 2023-04-11 NOTE — Telephone Encounter (Signed)
Patient wants to make an appointment with him . ZO#[phone]

## 2023-04-12 NOTE — Progress Notes (Signed)
Orthopedic Surgery Office Note     Assessment: Patient is a 87 y.o. female with left knee hematoma which has decreased in size. Still having some pain with direct pressure over the area of the hematoma   Plan: -Operative plans: none at this time -Weight bearing as tolerated -Since her pain is tolerable and the hematoma has decreased in size slightly since last visit, will continue to monitor -Follow-up in 4 weeks for repeat check, x-rays needed at next visit: none   ___________________________________________________________________________   Subjective:  Patient feels that her pain is about the same as our last visit. She feels the pain with direct pressure on the area of the hematoma and protuberance around her knee. Pain is currently tolerable and she is only using OTC medications for pain control. She feels the hematoma has decreased slightly in size. Has not noticed any redness or drainage around the knee. Nearly all of her old abrasions over the anterior knee have healed.     Physical Exam:   General: no acute distress, appears stated age Neurologic: sleeping but awakes to voice, answering questions appropriately, following commands Respiratory: unlabored breathing on room air, symmetric chest rise Psychiatric: appropriate affect, normal cadence to speech   MSK:    -Left lower extremity            Nontender to palpation over the knee. No ecchymosis or erythema seen. One pin sized scab over the anterior knee. Protuberance over the region of the patellar tendon which has decreased in size since last office visit. No pain through range of motion at the knee EHL/TA/GSC intact Plantarflexes and dorsiflexes toes Sensation intact to light touch in sural, saphenous, tibial, deep peroneal, and superficial peroneal nerve distributions Foot warm and well perfused   WBC on 04/01/2023 was reviewed and was wnl at 8.4   Patient name: Lydia Graham Patient MRN: [id number] Date:  04/11/2023

## 2023-04-14 ENCOUNTER — Ambulatory Visit (HOSPITAL_COMMUNITY): Payer: Medicare Other | Admitting: Physical Therapy

## 2023-04-17 ENCOUNTER — Ambulatory Visit (HOSPITAL_COMMUNITY): Payer: Medicare Other | Admitting: Physical Therapy

## 2023-04-17 DIAGNOSIS — M47812 Spondylosis without myelopathy or radiculopathy, cervical region: Secondary | ICD-10-CM | POA: Diagnosis not present

## 2023-04-21 ENCOUNTER — Ambulatory Visit (HOSPITAL_COMMUNITY): Payer: Medicare Other

## 2023-04-24 ENCOUNTER — Ambulatory Visit (HOSPITAL_COMMUNITY): Payer: Medicare Other | Admitting: Physical Therapy

## 2023-04-28 DIAGNOSIS — Z01419 Encounter for gynecological examination (general) (routine) without abnormal findings: Secondary | ICD-10-CM | POA: Diagnosis not present

## 2023-04-28 DIAGNOSIS — Z1231 Encounter for screening mammogram for malignant neoplasm of breast: Secondary | ICD-10-CM | POA: Diagnosis not present

## 2023-04-30 ENCOUNTER — Telehealth: Payer: Self-pay

## 2023-04-30 NOTE — Telephone Encounter (Signed)
Transition Care Management Follow-up Telephone Call Date of discharge and from where: 04/01/2023 [facility] How have you been since you were released from the hospital? Patient stated she is feeling much better. Any questions or concerns? No  Items Reviewed: Did the pt receive and understand the discharge instructions provided? Yes  Medications obtained and verified?  No medication prescribed. Other? No  Any new allergies since your discharge? No  Dietary orders reviewed? Yes Do you have support at home? Yes   Follow up appointments reviewed:  PCP Hospital f/u appt confirmed? No  Scheduled to see  on  @ . Specialist Hospital f/u appt confirmed? Yes  Scheduled to see Antoine Poche, MD on 04/03/2023 @ [facility] [place]. Are transportation arrangements needed? No  If their condition worsens, is the pt aware to call PCP or go to the Emergency Dept.? Yes Was the patient provided with contact information for the PCP's office or ED? Yes Was to pt encouraged to call back with questions or concerns? Yes   Aleeta Schmaltz [facility]  [facility], [facility] Guide Direct Dial: [phone]  Website: [email or web address]

## 2023-05-01 ENCOUNTER — Ambulatory Visit (INDEPENDENT_AMBULATORY_CARE_PROVIDER_SITE_OTHER): Payer: Medicare Other | Admitting: Family Medicine

## 2023-05-01 ENCOUNTER — Encounter: Payer: Self-pay | Admitting: Family Medicine

## 2023-05-01 ENCOUNTER — Ambulatory Visit (HOSPITAL_COMMUNITY)
Admission: RE | Admit: 2023-05-01 | Discharge: 2023-05-01 | Disposition: A | Discharge status: Home / Self Care | Payer: Medicare Other | Source: Ambulatory Visit | Attending: Family Medicine | Admitting: Family Medicine

## 2023-05-01 VITALS — BP 157/80 | HR 66 | Ht 67.0 in

## 2023-05-01 DIAGNOSIS — M25511 Pain in right shoulder: Secondary | ICD-10-CM | POA: Diagnosis not present

## 2023-05-01 MED ORDER — OXYCODONE-ACETAMINOPHEN 5-325 MG PO TABS
1.0000 | ORAL_TABLET | ORAL | 0 refills | Status: AC | PRN
Start: 2023-05-01 — End: 2023-05-06

## 2023-05-01 MED ORDER — BACLOFEN 10 MG PO TABS
10.0000 mg | ORAL_TABLET | Freq: Every evening | ORAL | 0 refills | Status: AC | PRN
Start: 2023-05-01 — End: ?

## 2023-05-01 NOTE — Progress Notes (Signed)
Established Patient Office Visit  Subjective:  Patient ID: Lydia Graham, female    DOB: March 15, 1936  Age: 87 y.o. MRN: [id number]  CC:  Chief Complaint  Patient presents with   Follow-up    Rt shoulder pain.     HPI Lydia Graham is a 87 y.o. female presents with complaints of right shoulder pain that began a week ago, following an ablation procedure in her neck for arthritis. She reports having undergone neck ablation 3 weeks ago, and now rates her shoulder pain as 10 out of 10 in intensity. She has been taking Tylenol with minimal relief. She states that she is able to sleep on the affected side with minimal discomfort but notes numbness in the affected shoulder. Range of motion is intact, with no visible swelling or recent injury reported.  Past Medical History:  Diagnosis Date   Arthritis    oa, all over - multiple areas    Carotid artery occlusion    Dysrhythmia    AFib    GERD (gastroesophageal reflux disease)    Headache    h/o migraines    Hypertension    PAF (paroxysmal atrial fibrillation) (HCC)    a. multiple DCCV's in 2019 and eventually required initiation of Amiodarone with successful DCCV after this. b. recurrent in 12/2021 while admitted for Urosepsis    Past Surgical History:  Procedure Laterality Date   ABDOMINAL HYSTERECTOMY     APPENDECTOMY     BACK SURGERY     CARDIOVERSION N/A 12/19/2017   Procedure: CARDIOVERSION;  Surgeon: Antoine Poche, MD;  Location: [facility];  Service: Endoscopy;  Laterality: N/A;   CARDIOVERSION N/A 12/26/2017   Procedure: CARDIOVERSION;  Surgeon: Chrystie Nose, MD;  Location: [facility] ENDOSCOPY;  Service: Cardiovascular;  Laterality: N/A;   CARDIOVERSION N/A 01/26/2018   Procedure: CARDIOVERSION;  Surgeon: Jake Bathe, MD;  Location: [facility] ENDOSCOPY;  Service: Cardiovascular;  Laterality: N/A;   CAROTID ENDARTERECTOMY     CATARACT EXTRACTION W/PHACO Right 03/09/2021   Procedure: CATARACT EXTRACTION PHACO AND  INTRAOCULAR LENS PLACEMENT with Placement of Corticosteroid (IOC);  Surgeon: Fabio Pierce, MD;  Location: [facility] ORS;  Service: Ophthalmology;  Laterality: Right;  CDE 9.16   CATARACT EXTRACTION W/PHACO Left 04/02/2021   Procedure: CATARACT EXTRACTION PHACO AND INTRAOCULAR LENS PLACEMENT LEFT EYE;  Surgeon: Fabio Pierce, MD;  Location: [facility] ORS;  Service: Ophthalmology;  Laterality: Left;  left CDE=6.75   ENDARTERECTOMY Left 03/27/2017   Procedure: ENDARTERECTOMY CAROTID LEFT;  Surgeon: Maeola Harman, MD;  Location: [facility] OR;  Service: Vascular;  Laterality: Left;   INNER EAR SURGERY Right    puntured eardrum- repaired 2x's    KNEE ARTHROPLASTY     NASAL SINUS SURGERY     deviated septum   PATCH ANGIOPLASTY Left 03/27/2017   Procedure: PATCH ANGIOPLASTY LEFT CAROTID ARTERY USING Livia Snellen BIOLOGIC PATCH;  Surgeon: Maeola Harman, MD;  Location[facility] OR;  Service: Vascular;  Laterality: Left;   TONSILLECTOMY     TOTAL KNEE ARTHROPLASTY Right 10/23/2016   Procedure: RIGHT TOTAL KNEE ARTHROPLASTY;  Surgeon: Nadara Mustard, MD;  Location: [facility] OR;  Service: Orthopedics;  Laterality: Right;    Family History  Problem Relation Age of Onset   Stroke Mother    Rheum arthritis Sister     Social History   Socioeconomic History   Marital status: Single    Spouse name: Not on file   Number of children: Not on file   Years of  education: Not on file   Highest education level: Not on file  Occupational History   Not on file  Tobacco Use   Smoking status: Never   Smokeless tobacco: Never  Vaping Use   Vaping status: Never Used  Substance and Sexual Activity   Alcohol use: No   Drug use: No   Sexual activity: Not Currently    Birth control/protection: Surgical  Other Topics Concern   Not on file  Social History Narrative   Not on file   Social Determinants of Health   Financial Resource Strain: Low Risk  (06/28/2022)   Overall Financial Resource Strain (CARDIA)     Difficulty of Paying Living Expenses: Not hard at all  Food Insecurity: No Food Insecurity (02/13/2023)   Hunger Vital Sign    Worried About Running Out of Food in the Last Year: Never true    Ran Out of Food in the Last Year: Never true  Transportation Needs: No Transportation Needs (06/28/2022)   PRAPARE - Administrator, Civil Service (Medical): No    Lack of Transportation (Non-Medical): No  Physical Activity: Sufficiently Active (06/28/2022)   Exercise Vital Sign    Days of Exercise per Week: 3 days    Minutes of Exercise per Session: 50 min  Stress: No Stress Concern Present (06/28/2022)   [employer] of Occupational Health - Occupational Stress Questionnaire    Feeling of Stress : Not at all  Social Connections: Moderately Integrated (06/28/2022)   Social Connection and Isolation Panel [NHANES]    Frequency of Communication with Friends and Family: More than three times a week    Frequency of Social Gatherings with Friends and Family: More than three times a week    Attends Religious Services: More than 4 times per year    Active Member of [employer] or Organizations: Yes    Attends Banker Meetings: More than 4 times per year    Marital Status: Widowed  Intimate Partner Violence: Not At Risk (06/28/2022)   Humiliation, Afraid, Rape, and Kick questionnaire    Fear of Current or Ex-Partner: No    Emotionally Abused: No    Physically Abused: No    Sexually Abused: No    Outpatient Medications Prior to Visit  Medication Sig Dispense Refill   amiodarone (PACERONE) 200 MG tablet Take 0.5 tablets (100 mg total) by mouth every other day. 45 tablet 3   amLODipine (NORVASC) 5 MG tablet take 1 tablet at 8am and 1 tablet at 8pm. 60 tablet 4   apixaban (ELIQUIS) 5 MG TABS tablet Take 1 tablet (5 mg total) by mouth 2 (two) times daily. 60 tablet 5   Calcium Carb-Cholecalciferol (CALCIUM + VITAMIN D3 PO) Take 1 tablet by mouth daily.     Camphor-Menthol-Methyl Sal  (SALONPAS) 3.06-29-08 % PTCH Place 1 patch onto the skin at bedtime as needed (foot pain).     Cyanocobalamin (VITAMIN B-12 PO) Take 1 tablet by mouth daily.     estradiol (ESTRACE) 0.5 MG tablet Take 0.5 mg by mouth daily.      furosemide (LASIX) 20 MG tablet Take 1 tablet (20 mg total) by mouth as needed for fluid or edema. 30 tablet 2   Hypromellose (ARTIFICIAL TEARS OP) Place 2 drops into both eyes 2 (two) times daily as needed (for dry eyes).     losartan (COZAAR) 50 MG tablet take 1 tablet at 10 IN THE MORNING and 1 tablet at 10 pm. 60 tablet 9  MAGNESIUM PO Take 1 capsule by mouth every evening.     metoprolol succinate (TOPROL-XL) 25 MG 24 hr tablet Take 1 tablet (25 mg total) by mouth daily. 30 tablet 2   oxyCODONE (OXY IR/ROXICODONE) 5 MG immediate release tablet Take 1 tablet (5 mg total) by mouth every 6 (six) hours as needed for moderate pain or severe pain. 30 tablet 0   pantoprazole (PROTONIX) 40 MG tablet TAKE (1) TABLET TWICE DAILY. 60 tablet 6   UNABLE TO FIND Med Name: Mediven Comfort calf medical compression stocking 30-40 ICD: I83.893 1 each 1   No facility-administered medications prior to visit.    Allergies  Allergen Reactions   Crestor [Rosuvastatin] Other (See Comments)    Myalgias   Other Other (See Comments)    Hypotension caused by pain medication/anesthesia   Singulair [Montelukast Sodium] Other (See Comments)    Fatigue    Zestril [Lisinopril] Cough    ROS Review of Systems  Constitutional:  Negative for chills and fever.  Eyes:  Negative for visual disturbance.  Respiratory:  Negative for chest tightness and shortness of breath.   Musculoskeletal:        Right shoulder pain  Neurological:  Negative for dizziness and headaches.      Objective:    Physical Exam HENT:     Head: Normocephalic.     Mouth/Throat:     Mouth: Mucous membranes are moist.  Cardiovascular:     Rate and Rhythm: Normal rate.     Heart sounds: Normal heart sounds.   Pulmonary:     Effort: Pulmonary effort is normal.     Breath sounds: Normal breath sounds.  Musculoskeletal:     Right shoulder: No swelling, deformity, effusion or laceration. Normal range of motion.     Left shoulder: No swelling, deformity, effusion or laceration. Normal range of motion.  Neurological:     Mental Status: She is alert.     BP (!) 157/80   Pulse 66   Ht 5\' 7"  (1.702 m)   LMP  (LMP Unknown)   SpO2 94%   BMI 22.80 kg/m  Wt Readings from Last 3 Encounters:  04/03/23 145 lb 9.6 oz (66 kg)  04/01/23 143 lb 4.8 oz (65 kg)  03/19/23 144 lb 0.6 oz (65.3 kg)    Lab Results  Component Value Date   TSH 2.728 03/05/2023   Lab Results  Component Value Date   WBC 8.4 04/01/2023   HGB 13.4 04/01/2023   HCT 40.4 04/01/2023   MCV 88.4 04/01/2023   PLT 375 04/01/2023   Lab Results  Component Value Date   NA 131 (L) 04/01/2023   K 4.1 04/01/2023   CO2 23 04/01/2023   GLUCOSE 113 (H) 04/01/2023   BUN 20 04/01/2023   CREATININE 1.05 (H) 04/01/2023   BILITOT 0.6 02/10/2023   ALKPHOS 63 02/10/2023   AST 16 02/10/2023   ALT 11 02/10/2023   PROT 6.4 (L) 02/10/2023   ALBUMIN 3.0 (L) 02/10/2023   CALCIUM 9.2 04/01/2023   ANIONGAP 9 04/01/2023   EGFR 43 (L) 09/06/2022   Lab Results  Component Value Date   CHOL 185 09/06/2022   Lab Results  Component Value Date   HDL 49 09/06/2022   Lab Results  Component Value Date   LDLCALC 114 (H) 09/06/2022   Lab Results  Component Value Date   TRIG 120 09/06/2022   Lab Results  Component Value Date   CHOLHDL 3.8 09/06/2022   Lab Results  Component Value Date   HGBA1C 5.6 09/06/2022      Assessment & Plan:  Acute pain of right shoulder Assessment & Plan: An imaging study will be obtained to rule out any underlying pathology. The patient will be initiated on Percocet 5-325 mg every 4 hours as needed for pain until her follow-up with the orthopedics team. She is encouraged to take baclofen 10 mg at bedtime.  Supportive care measures, including rest, ice therapy, avoiding aggravating activities, and engaging in stretching and strengthening exercises, were also recommended.   Orders: -     oxyCODONE-Acetaminophen; Take 1 tablet by mouth every 4 (four) hours as needed for up to 5 days for severe pain (pain score 7-10).  Dispense: 15 tablet; Refill: 0 -     Baclofen; Take 1 tablet (10 mg total) by mouth at bedtime as needed for muscle spasms.  Dispense: 30 each; Refill: 0 -     DG Shoulder Right  Note: This chart has been completed using Engineer, civil (consulting) software, and while attempts have been made to ensure accuracy, certain words and phrases may not be transcribed as intended.    Follow-up: No follow-ups on file.   [name], FNP

## 2023-05-01 NOTE — Patient Instructions (Addendum)
I appreciate the opportunity to provide care to you today!   Visit [facility]  to get an x-ray of the right shoulder. Baclofen 10 mg at bedtime as needed  for muscle relaxation or spasms. Percocet for pain relief (this is a combination of acetaminophen and oxycodone, a prescription pain medication).  For Right Shoulder pain, I recommend:   -Activity Modification Rest and Activity Modification: Avoiding activities that exacerbate pain, such as overhead movements or lifting heavy weights, can give the shoulder time to heal and prevent further injury. -Heat and Cold Therapy Cold Therapy (Cryotherapy): Applying ice packs can help reduce inflammation and numb acute pain, especially after activity or in the case of acute injury like a strain or sprain. Heat Therapy: Heat pads or warm baths can help relax tight muscles and improve blood flow to the area, especially useful for chronic pain or muscle stiffness. -Stretching and Strengthening Exercises: Tailored exercises help improve range of motion, strengthen muscles, and restore function. For example, rotator cuff exercises can strengthen the muscles around the shoulder joint.  Attached with your AVS, you will find valuable resources for self-education. I highly recommend dedicating some time to thoroughly examine them.   Please continue to a heart-healthy diet and increase your physical activities. Try to exercise for at least five days a week.    It was a pleasure to see you and I look forward to continuing to work together on your health and well-being. Please do not hesitate to call the office if you need care or have questions about your care.  In case of emergency, please visit the Emergency Department for urgent care, or contact our clinic at [phone] to schedule an appointment. We're here to help you!   Have a wonderful day and week. With Gratitude, [name] MSN, FNP-BC

## 2023-05-02 NOTE — Assessment & Plan Note (Signed)
An imaging study will be obtained to rule out any underlying pathology. The patient will be initiated on Percocet 5-325 mg every 4 hours as needed for pain until her follow-up with the orthopedics team. She is encouraged to take baclofen 10 mg at bedtime. Supportive care measures, including rest, ice therapy, avoiding aggravating activities, and engaging in stretching and strengthening exercises, were also recommended.

## 2023-05-05 NOTE — Progress Notes (Signed)
Hello [place] [facility],  your x-ray results suggest that the area is healthy in terms of bone and soft tissue structure, and no significant problems were identified

## 2023-05-08 ENCOUNTER — Ambulatory Visit: Payer: Medicare Other | Attending: Cardiology | Admitting: Cardiology

## 2023-05-08 ENCOUNTER — Encounter: Payer: Self-pay | Admitting: Cardiology

## 2023-05-08 VITALS — BP 146/70 | HR 54 | Ht 67.0 in | Wt 149.0 lb

## 2023-05-08 DIAGNOSIS — I480 Paroxysmal atrial fibrillation: Secondary | ICD-10-CM | POA: Insufficient documentation

## 2023-05-08 MED ORDER — AMIODARONE HCL 200 MG PO TABS: 100.0000 mg | ORAL_TABLET | Freq: Every day | ORAL | 1 refills | Status: DC

## 2023-05-08 NOTE — Progress Notes (Signed)
Clinical Summary Lydia Graham is a 87 y.o.female seen today for follow up of the following medical problems.    1.Afib - followed by EP Dr Lydia Graham - has been on amio 100mg  qod     04/01/23 ER visit with palpitations - home HRs reported at 170, by ER evaluation was already back in SR - K 4.1, ER EKG showed SR LBBB - felt heart go back into rhythm while in ER   - feeling of palpitations, chest tightness, SOB twice since our last visit -symptoms last about 1 hour - reports medication compliance - can check HRs and register in 170s    Other medical problems not addressed this visit     2.Systolic dysfunction 12/2021 echo LVEF 45-50%, no MR. This was in setting of admission with sepsis, afib with RVR 03/2022 echo LVEF 55-60%, no WMAs, grade II dd       3. HTN - home bp's120s-130s/60s, rare 140s.      4. Bilateral LE edema - no recent issues   5. Carotid stenosis - prior CEA - 05/2021 study 1-39% bilateral disease 05/2022 RICA 1-39%, LICA 1-39% - followed by vascular   6. Mitral regurgitation - moderate by echo 2019 12/2021 echo LVEF 45-50%, no MR - 03/2022 echo LVEF 55-60%, mild MR    Past Medical History:  Diagnosis Date   Arthritis    oa, all over - multiple areas    Carotid artery occlusion    Dysrhythmia    AFib    GERD (gastroesophageal reflux disease)    Headache    h/o migraines    Hypertension    PAF (paroxysmal atrial fibrillation) (HCC)    a. multiple DCCV's in 2019 and eventually required initiation of Amiodarone with successful DCCV after this. b. recurrent in 12/2021 while admitted for Urosepsis     Allergies  Allergen Reactions   Crestor [Rosuvastatin] Other (See Comments)    Myalgias   Other Other (See Comments)    Hypotension caused by pain medication/anesthesia   Singulair [Montelukast Sodium] Other (See Comments)    Fatigue    Zestril [Lisinopril] Cough     Current Outpatient Medications  Medication Sig Dispense Refill    amiodarone (PACERONE) 200 MG tablet Take 0.5 tablets (100 mg total) by mouth every other day. 45 tablet 3   amLODipine (NORVASC) 5 MG tablet take 1 tablet at 8am and 1 tablet at 8pm. 60 tablet 4   apixaban (ELIQUIS) 5 MG TABS tablet Take 1 tablet (5 mg total) by mouth 2 (two) times daily. 60 tablet 5   baclofen (LIORESAL) 10 MG tablet Take 1 tablet (10 mg total) by mouth at bedtime as needed for muscle spasms. 30 each 0   Calcium Carb-Cholecalciferol (CALCIUM + VITAMIN D3 PO) Take 1 tablet by mouth daily.     Camphor-Menthol-Methyl Sal (SALONPAS) 3.06-29-08 % PTCH Place 1 patch onto the skin at bedtime as needed (foot pain).     Cyanocobalamin (VITAMIN B-12 PO) Take 1 tablet by mouth daily.     estradiol (ESTRACE) 0.5 MG tablet Take 0.5 mg by mouth daily.      furosemide (LASIX) 20 MG tablet Take 1 tablet (20 mg total) by mouth as needed for fluid or edema. 30 tablet 2   Hypromellose (ARTIFICIAL TEARS OP) Place 2 drops into both eyes 2 (two) times daily as needed (for dry eyes).     losartan (COZAAR) 50 MG tablet take 1 tablet at 10 IN THE MORNING and  1 tablet at 10 pm. 60 tablet 9   MAGNESIUM PO Take 1 capsule by mouth every evening.     metoprolol succinate (TOPROL-XL) 25 MG 24 hr tablet Take 1 tablet (25 mg total) by mouth daily. 30 tablet 2   oxyCODONE (OXY IR/ROXICODONE) 5 MG immediate release tablet Take 1 tablet (5 mg total) by mouth every 6 (six) hours as needed for moderate pain or severe pain. 30 tablet 0   pantoprazole (PROTONIX) 40 MG tablet TAKE (1) TABLET TWICE DAILY. 60 tablet 6   UNABLE TO FIND Med Name: Mediven Comfort calf medical compression stocking 30-40 ICD: I83.893 1 each 1   No current facility-administered medications for this visit.     Past Surgical History:  Procedure Laterality Date   ABDOMINAL HYSTERECTOMY     APPENDECTOMY     BACK SURGERY     CARDIOVERSION N/A 12/19/2017   Procedure: CARDIOVERSION;  Surgeon: Lydia Poche, MD;  Location: [facility] ENDO SUITE;   Service: Endoscopy;  Laterality: N/A;   CARDIOVERSION N/A 12/26/2017   Procedure: CARDIOVERSION;  Surgeon: Lydia Nose, MD;  Location: [facility] ENDOSCOPY;  Service: Cardiovascular;  Laterality: N/A;   CARDIOVERSION N/A 01/26/2018   Procedure: CARDIOVERSION;  Surgeon: Lydia Bathe, MD;  Location: [facility] ENDOSCOPY;  Service: Cardiovascular;  Laterality: N/A;   CAROTID ENDARTERECTOMY     CATARACT EXTRACTION W/PHACO Right 03/09/2021   Procedure: CATARACT EXTRACTION PHACO AND INTRAOCULAR LENS PLACEMENT with Placement of Corticosteroid (IOC);  Surgeon: Lydia Pierce, MD;  Location: [facility] ORS;  Service: Ophthalmology;  Laterality: Right;  CDE 9.16   CATARACT EXTRACTION W/PHACO Left 04/02/2021   Procedure: CATARACT EXTRACTION PHACO AND INTRAOCULAR LENS PLACEMENT LEFT EYE;  Surgeon: Lydia Pierce, MD;  Location: [facility] ORS;  Service: Ophthalmology;  Laterality: Left;  left CDE=6.75   ENDARTERECTOMY Left 03/27/2017   Procedure: ENDARTERECTOMY CAROTID LEFT;  Surgeon: Lydia Harman, MD;  Location: [facility] OR;  Service: Vascular;  Laterality: Left;   INNER EAR SURGERY Right    puntured eardrum- repaired 2x's    KNEE ARTHROPLASTY     NASAL SINUS SURGERY     deviated septum   PATCH ANGIOPLASTY Left 03/27/2017   Procedure: PATCH ANGIOPLASTY LEFT CAROTID ARTERY USING Lydia Graham BIOLOGIC PATCH;  Surgeon: Lydia Harman, MD;  Location: [facility] OR;  Service: Vascular;  Laterality: Left;   TONSILLECTOMY     TOTAL KNEE ARTHROPLASTY Right 10/23/2016   Procedure: RIGHT TOTAL KNEE ARTHROPLASTY;  Surgeon: Lydia Mustard, MD;  Location: [facility] OR;  Service: Orthopedics;  Laterality: Right;     Allergies  Allergen Reactions   Crestor [Rosuvastatin] Other (See Comments)    Myalgias   Other Other (See Comments)    Hypotension caused by pain medication/anesthesia   Singulair [Montelukast Sodium] Other (See Comments)    Fatigue    Zestril [Lisinopril] Cough      Family History  Problem Relation Age of Onset   Stroke  Mother    Rheum arthritis Sister      Social History Ms. Lydia Graham reports that she has never smoked. She has never used smokeless tobacco. Ms. Lydia Graham reports no history of alcohol use.   Review of Systems CONSTITUTIONAL: No weight loss, fever, chills, weakness or fatigue.  HEENT: Eyes: No visual loss, blurred vision, double vision or yellow sclerae.No hearing loss, sneezing, congestion, runny Graham or sore throat.  SKIN: No rash or itching.  CARDIOVASCULAR: per hpi RESPIRATORY: No shortness of breath, cough or sputum.  GASTROINTESTINAL: No anorexia, nausea, vomiting or diarrhea.  No abdominal pain or blood.  GENITOURINARY: No burning on urination, no polyuria NEUROLOGICAL: No headache, dizziness, syncope, paralysis, ataxia, numbness or tingling in the extremities. No change in bowel or bladder control.  MUSCULOSKELETAL: No muscle, back pain, joint pain or stiffness.  LYMPHATICS: No enlarged nodes. No history of splenectomy.  PSYCHIATRIC: No history of depression or anxiety.  ENDOCRINOLOGIC: No reports of sweating, cold or heat intolerance. No polyuria or polydipsia.  Marland Kitchen   Physical Examination Today's Vitals   05/08/23 0827  BP: (!) 158/68  Pulse: (!) 54  SpO2: 98%  Weight: 149 lb (67.6 kg)  Height: 5\' 7"  (1.702 m)   Body mass index is 23.34 kg/m.  Gen: resting comfortably, no acute distress HEENT: no scleral icterus, pupils equal round and reactive, no palptable cervical adenopathy,  CV: RRR, no m/rg, no jvd Resp: Clear to auscultation bilaterally GI: abdomen is soft, non-tender, non-distended, normal bowel sounds, no hepatosplenomegaly MSK: extremities are warm, no edema.  Skin: warm, no rash Neuro:  no focal deficits Psych: appropriate affect   Diagnostic Studies  02/2021 echo Global longitudinal strain was attempted.  IMPRESSIONS     1. Left ventricular ejection fraction, by estimation, is 65 to 70%. The  left ventricle has normal function. The left ventricle  has no regional  wall motion abnormalities. Left ventricular diastolic parameters are  indeterminate.   2. Right ventricular systolic function is normal. The right ventricular  size is normal. There is mildly elevated pulmonary artery systolic  pressure.   3. Left atrial size was mildly dilated.   4. The mitral valve is normal in structure. Trivial mitral valve  regurgitation. No evidence of mitral stenosis.   5. The aortic valve is tricuspid. Aortic valve regurgitation is not  visualized. No aortic stenosis is present.   6. The inferior vena cava is normal in size with greater than 50%  respiratory variability, suggesting right atrial pressure of 3 mmHg.   Comparison(s): Previous Echo was technically challenging due to A-fib. LV  EF was @50 %, with moderate MR and TR, and biatrial enlargement.     12/2021 echo    1. Left ventricular ejection fraction, by estimation, is 45 to 50%. The  left ventricle has mildly decreased function. The left ventricle has no  regional wall motion abnormalities. There is mild left ventricular  hypertrophy. Left ventricular diastolic  parameters are indeterminate.   2. Right ventricular systolic function is normal. The right ventricular  size is normal. There is mildly elevated pulmonary artery systolic  pressure.   3. Left atrial size was mildly dilated.   4. Right atrial size was mildly dilated.   5. The mitral valve is normal in structure. No evidence of mitral valve  regurgitation. No evidence of mitral stenosis.   6. The aortic valve is normal in structure. Aortic valve regurgitation is  not visualized. No aortic stenosis is present.   7. The inferior vena cava is normal in size with greater than 50%  respiratory variability, suggesting right atrial pressure of 3 mmHg.     05/2021 carotid US Summary:  Right Carotid: Velocities in the right ICA are consistent with a 1-39%  stenosis.   Left Carotid: Velocities in the left ICA are consistent  with a 1-39%  stenosis.   Vertebrals:  Bilateral vertebral arteries demonstrate antegrade flow.  Subclavians: Normal flow hemodynamics were seen in bilateral subclavian               arteries.  03/2022 echo 1. Left ventricular ejection fraction, by estimation, is 55 to 60%. The  left ventricle has normal function. The left ventricle has no regional  wall motion abnormalities. There is mild asymmetric left ventricular  hypertrophy of the basal segment. Left  ventricular diastolic parameters are consistent with Grade II diastolic  dysfunction (pseudonormalization). The average left ventricular global  longitudinal strain is -19.8 %. The global longitudinal strain is normal.   2. Right ventricular systolic function is normal. The right ventricular  size is normal. There is mildly elevated pulmonary artery systolic  pressure. The estimated right ventricular systolic pressure is 40.7 mmHg.   3. Left atrial size was mildly dilated.   4. The mitral valve is grossly normal. Mild mitral valve regurgitation.   5. Tricuspid valve regurgitation is mild to moderate.   6. The aortic valve is tricuspid. Aortic valve regurgitation is not  visualized. Aortic valve sclerosis is present, with no evidence of aortic  valve stenosis.   7. The inferior vena cava is normal in size with greater than 50%  respiratory variability, suggesting right atrial pressure of 3 mmHg.        Assessment and Plan   Afib/acquired thrombophilia -some reoccuring episodes of palpitations, high heart rates at home - we will increase her amiodarone from 100mg  every other day to 100mg  daily. If ongoing symptoms consider a higher dose load.   F/u 6 months     Lydia Graham, M.D.

## 2023-05-08 NOTE — Patient Instructions (Addendum)
Medication Instructions:  Your physician has recommended you make the following change in your medication:  Increase amiodarone to 100 mg daily Continue all other medications as prescribed  Labwork: none  Testing/Procedures: none  Follow-Up: Your physician recommends that you schedule a follow-up appointment in: 6 months  Any Other Special Instructions Will Be Listed Below (If Applicable).  If you need a refill on your cardiac medications before your next appointment, please call your pharmacy.

## 2023-05-12 ENCOUNTER — Ambulatory Visit (INDEPENDENT_AMBULATORY_CARE_PROVIDER_SITE_OTHER): Payer: Medicare Other | Admitting: Orthopedic Surgery

## 2023-05-12 DIAGNOSIS — T148XXA Other injury of unspecified body region, initial encounter: Secondary | ICD-10-CM

## 2023-05-12 DIAGNOSIS — M25562 Pain in left knee: Secondary | ICD-10-CM

## 2023-05-12 NOTE — Progress Notes (Signed)
Orthopedic Surgery Office Note     Assessment: Patient is a 87 y.o. female with two issues:   1) persistent left knee hematoma that is still causing her pain (~3 months from injury)  2) cervical radiculopathy   Plan: -She has now waited 3 months for this hematoma to resolve, she is still having pain and there is still a hematoma present so discussed operative management as a treatment option for her. After this discussion, patient elected to proceed -Weight bearing as tolerated -For her cervical radiculopathy, recommended cervical ESI with Dr. Alvester Morin. Referral provided to her today -Patient will next be seen at the date of surgery  As noted above, we discussed surgical management of her hematoma since it has not gotten better. Discussed surgery in the form of incision and drainage of hematoma. The risks of surgery were discussed which included but were not limited to: hematoma recurrence, infection, bleeding, stiffness, persistent anterior knee pain, wound dehiscence, need for additional procedures, death, dvt/pe. The benefits of surgery would be to drain the hematoma and hopefully help with her pain. The alternatives would be to continue to monitor, compressive dressing, OTC medications. After this conversation, patient elected to proceed with surgery.    ___________________________________________________________________________   Subjective:  Patient is still having pain in her left knee. She feels it in the area of swelling on the anterior aspect of the knee. She has pain with any kneeling activity. She has pain if she hits it against something or rolls onto it in bed. It sometimes disrupts her sleep as a result. She is sick of dealing with this hematoma.  Patient also wanted to talk about her recent onset of neck pain that radiates into her right shoulder and lateral arm. It does not radiate past the elbow. She has no pain radiating into the left upper extremity. She feels a numbness in the  same distribution as the pain. There was no trauma or injury that preceded the onset of pain. She has not noticed any weakness.      Physical Exam:   General: no acute distress, appears stated age Neurologic: sleeping but awakes to voice, answering questions appropriately, following commands Respiratory: unlabored breathing on room air, symmetric chest rise Psychiatric: appropriate affect, normal cadence to speech   MSK:    -Left lower extremity            Nontender to palpation over the knee. No ecchymosis or erythema seen. One pin sized scab over the anterior knee remains. No active bleeding or drainage. Protuberance over the region of the patellar tendon remains the same as our last visit  No pain with passive range of motion at the knee EHL/TA/GSC intact Plantarflexes and dorsiflexes toes Sensation intact to light touch in sural, saphenous, tibial, deep peroneal, and superficial peroneal nerve distributions Foot warm and well perfused  UE neuro exam: 5/5 strength in bilateral upper extremities in all myotomes, sensation intact to light touch in C5-T1 distributions bilaterally, negative spurling bilaterally, negative hoffman bilaterally, no interosseus muscle wasting seen, negative grip and release test    Patient name: Lydia Graham Patient MRN: [id number] Date: 05/12/23

## 2023-05-13 ENCOUNTER — Telehealth: Payer: Self-pay | Admitting: Orthopedic Surgery

## 2023-05-13 MED ORDER — OXYCODONE HCL 5 MG PO TABS
5.0000 mg | ORAL_TABLET | ORAL | 0 refills | Status: AC | PRN
Start: 2023-05-13 — End: 2023-05-18

## 2023-05-13 NOTE — Telephone Encounter (Signed)
Pt called stating Dr Christell Constant was to send oxycodone in to [facility]. Pt phone number is [phone].

## 2023-05-15 ENCOUNTER — Telehealth: Payer: Self-pay | Admitting: Cardiology

## 2023-05-15 ENCOUNTER — Ambulatory Visit: Payer: Medicare Other | Admitting: Cardiology

## 2023-05-15 NOTE — Telephone Encounter (Signed)
Pt states she is calling to let the Doctor know her heart went out of rhythm again yesterday. Please advise

## 2023-05-15 NOTE — Telephone Encounter (Signed)
Spoke to pt who stated that yesterday she felt as if her heart went out of rhythm. Pt stated yesterday morning her hr was 174 bpm. Pt stated she took a whole amiodarone tablet (200 mg) and her metoprolol and felt better. Pt stated yesterday evening she felt the same sensation- 159 bpm, so she took another 0.5 tablet of amiodarone ( 100 mg). Pt stated she was sob yesterday. Pt reports today she feels much better with no symptoms and her heart rate is 61 bpm on last check.   Please advise.

## 2023-05-16 ENCOUNTER — Telehealth: Payer: Self-pay | Admitting: *Deleted

## 2023-05-16 NOTE — Telephone Encounter (Signed)
Increase amiodarone to 200mg  bid x 7 days, then resume prior dose of 100mg  daily   J Cord Wilczynski MD

## 2023-05-16 NOTE — Telephone Encounter (Signed)
Spoke to patient who verbalized understanding. Patient had no questions or concerns at this time. Patient will increase amiodarone to 200 mg BID for 7 days then go back to original dosing afterwards.

## 2023-05-16 NOTE — Telephone Encounter (Signed)
   Pre-operative Risk Assessment    Patient Name: Lydia Graham  DOB: 06-Mar-1936 MRN: [id number]      Request for Surgical Clearance    Procedure:   left knee hematoma incision and drainage  Date of Surgery:  Clearance TBD                                 Surgeon:  Dr. Willia Craze Surgeon's Group or Practice Name:  [facility] Phone number:  [phone] Fax number:  [phone]   Type of Clearance Requested:   - Medical  - Pharmacy:  Hold Apixaban (Eliquis) x 2 days   Type of Anesthesia:  Local    Additional requests/questions:   may fax correspondence to April B at [phone]  Elvin So   05/16/2023, 1:55 PM

## 2023-05-19 NOTE — Telephone Encounter (Signed)
Patient with diagnosis of Afib on Eliquis for anticoagulation.    Procedure: left knee hematoma incision and drainage  Date of procedure: TBD   CHA2DS2-VASc Score = 6   This indicates a 9.7% annual risk of stroke. The patient's score is based upon: CHF History: 1 HTN History: 1 Diabetes History: 0 Stroke History: 0 Vascular Disease History: 1 Age Score: 2 Gender Score: 1     CrCl 38 mL/min Platelet count 375 K(04/01/2023)    Per office protocol, patient can hold Eliquis for 2 days prior to procedure.     **This guidance is not considered finalized until pre-operative APP has relayed final recommendations.**

## 2023-05-21 NOTE — Telephone Encounter (Signed)
Ok to hold anticoagulation and proceed with surgery  Dominga Ferry MD

## 2023-05-21 NOTE — Telephone Encounter (Signed)
     Primary Cardiologist: [name], MD  Chart reviewed as part of pre-operative protocol coverage. Given past medical history and time since last visit, based on ACC/AHA guidelines, Lydia Graham would be at acceptable risk for the planned procedure without further cardiovascular testing.   Patient with diagnosis of Afib on Eliquis for anticoagulation.     Procedure: left knee hematoma incision and drainage  Date of procedure: TBD     CHA2DS2-VASc Score = 6   This indicates a 9.7% annual risk of stroke. The patient's score is based upon: CHF History: 1 HTN History: 1 Diabetes History: 0 Stroke History: 0 Vascular Disease History: 1 Age Score: 2 Gender Score: 1       CrCl 38 mL/min Platelet count 375 K(04/01/2023)       Per office protocol, patient can hold Eliquis for 2 days prior to procedure.  I will route this recommendation to the requesting party via Epic fax function and remove from pre-op pool.  Please call with questions.  Lydia Ripple. Rolland Steinert NP-C     05/21/2023, 12:47 PM [facility] [address] Office [phone] Fax [phone]

## 2023-05-29 NOTE — Progress Notes (Addendum)
Surgical Instructions   Your procedure is scheduled on DECEMBER 10,2024.Marland Kitchen Report to [facility] Main Entrance "A" at 10:30 A.M A.M., then check in with the Admitting office. Any questions or running late day of surgery: call [phone]  Questions prior to your surgery date: call [phone], Monday-Friday, 8am-4pm. If you experience any cold or flu symptoms such as cough, fever, chills, shortness of breath, etc. between now and your scheduled surgery, please notify us at the above number.     Remember:  Do not eat after midnight the night before your surgery  You may drink clear liquids until 9:30 the morning of your surgery.   Clear liquids allowed are: Water, Non-Citrus Juices (without pulp), Carbonated Beverages, Clear Tea (no milk, honey, etc.), Black Coffee Only (NO MILK, CREAM OR POWDERED CREAMER of any kind), and Gatorade.    Take these medicines the morning of surgery with A SIP OF WATER  amiodarone (PACERONE)  amLODipine (NORVASC)  estradiol (ESTRACE)  metoprolol succinate (TOPROL-XL)  pantoprazole (PROTONIX)   apixaban (ELIQUIS) LAST DOSE 05-31-23  One week prior to surgery, STOP taking any Aspirin (unless otherwise instructed by your surgeon) Aleve, Naproxen, Ibuprofen, Motrin, Advil, Goody's, BC's, all herbal medications, fish oil, and non-prescription vitamins.                     Do NOT Smoke (Tobacco/Vaping) for 24 hours prior to your procedure.  If you use a CPAP at night, you may bring your mask/headgear for your overnight stay.   You will be asked to remove any contacts, glasses, piercing's, hearing aid's, dentures/partials prior to surgery. Please bring cases for these items if needed.    Patients discharged the day of surgery will not be allowed to drive home, and someone needs to stay with them for 24 hours.  SURGICAL WAITING ROOM VISITATION Patients may have no more than 2 support people in the waiting area - these visitors may rotate.   Pre-op nurse  will coordinate an appropriate time for 1 ADULT support person, who may not rotate, to accompany patient in pre-op.  Children under the age of 36 must have an adult with them who is not the patient and must remain in the main waiting area with an adult.  If the patient needs to stay at the hospital during part of their recovery, the visitor guidelines for inpatient rooms apply.  Please refer to the [facility] website for the visitor guidelines for any additional information.   If you received a COVID test during your pre-op visit  it is requested that you wear a mask when out in public, stay away from anyone that may not be feeling well and notify your surgeon if you develop symptoms. If you have been in contact with anyone that has tested positive in the last 10 days please notify you surgeon.      Pre-operative 5 CHG Bathing Instructions   You can play a key role in reducing the risk of infection after surgery. Your skin needs to be as free of germs as possible. You can reduce the number of germs on your skin by washing with CHG (chlorhexidine gluconate) soap before surgery. CHG is an antiseptic soap that kills germs and continues to kill germs even after washing.   DO NOT use if you have an allergy to chlorhexidine/CHG or antibacterial soaps. If your skin becomes reddened or irritated, stop using the CHG and notify one of our RNs at [phone].   Please shower with  the CHG soap starting 4 days before surgery using the following schedule:     Please keep in mind the following:  DO NOT shave, including legs and underarms, starting the day of your first shower.   You may shave your face at any point before/day of surgery.  Place clean sheets on your bed the day you start using CHG soap. Use a clean washcloth (not used since being washed) for each shower. DO NOT sleep with pets once you start using the CHG.   CHG Shower Instructions:  Wash your face and private area with normal soap.  If you choose to wash your hair, wash first with your normal shampoo.  After you use shampoo/soap, rinse your hair and body thoroughly to remove shampoo/soap residue.  Turn the water OFF and apply about 3 tablespoons (45 ml) of CHG soap to a CLEAN washcloth.  Apply CHG soap ONLY FROM YOUR NECK DOWN TO YOUR TOES (washing for 3-5 minutes)  DO NOT use CHG soap on face, private areas, open wounds, or sores.  Pay special attention to the area where your surgery is being performed.  If you are having back surgery, having someone wash your back for you may be helpful. Wait 2 minutes after CHG soap is applied, then you may rinse off the CHG soap.  Pat dry with a clean towel  Put on clean clothes/pajamas   If you choose to wear lotion, please use ONLY the CHG-compatible lotions on the back of this paper.   Additional instructions for the day of surgery: DO NOT APPLY any lotions, deodorants, cologne, or perfumes.   Do not bring valuables to the hospital. [facility] is not responsible for any belongings/valuables. Do not wear nail polish, gel polish, artificial nails, or any other type of covering on natural nails (fingers and toes) Do not wear jewelry or makeup Put on clean/comfortable clothes.  Please brush your teeth.  Ask your nurse before applying any prescription medications to the skin.     CHG Compatible Lotions   Aveeno Moisturizing lotion  Cetaphil Moisturizing Cream  Cetaphil Moisturizing Lotion  Clairol Herbal Essence Moisturizing Lotion, Dry Skin  Clairol Herbal Essence Moisturizing Lotion, Extra Dry Skin  Clairol Herbal Essence Moisturizing Lotion, Normal Skin  Curel Age Defying Therapeutic Moisturizing Lotion with Alpha Hydroxy  Curel Extreme Care Body Lotion  Curel Soothing Hands Moisturizing Hand Lotion  Curel Therapeutic Moisturizing Cream, Fragrance-Free  Curel Therapeutic Moisturizing Lotion, Fragrance-Free  Curel Therapeutic Moisturizing Lotion, Original Formula   Eucerin Daily Replenishing Lotion  Eucerin Dry Skin Therapy Plus Alpha Hydroxy Crme  Eucerin Dry Skin Therapy Plus Alpha Hydroxy Lotion  Eucerin Original Crme  Eucerin Original Lotion  Eucerin Plus Crme Eucerin Plus Lotion  Eucerin TriLipid Replenishing Lotion  Keri Anti-Bacterial Hand Lotion  Keri Deep Conditioning Original Lotion Dry Skin Formula Softly Scented  Keri Deep Conditioning Original Lotion, Fragrance Free Sensitive Skin Formula  Keri Lotion Fast Absorbing Fragrance Free Sensitive Skin Formula  Keri Lotion Fast Absorbing Softly Scented Dry Skin Formula  Keri Original Lotion  Keri Skin Renewal Lotion Keri Silky Smooth Lotion  Keri Silky Smooth Sensitive Skin Lotion  Nivea Body Creamy Conditioning Oil  Nivea Body Extra Enriched Teacher, adult education Moisturizing Lotion Nivea Crme  Nivea Skin Firming Lotion  NutraDerm 30 Skin Lotion  NutraDerm Skin Lotion  NutraDerm Therapeutic Skin Cream  NutraDerm Therapeutic Skin Lotion  ProShield Protective Hand Cream  Provon moisturizing lotion  Please read  over the following fact sheets that you were given.    Please read over the following fact sheets that you were given.    If you received a COVID test during your pre-op visit  it is requested that you wear a mask when out in public, stay away from anyone that may not be feeling well and notify your surgeon if you develop symptoms. If you have been in contact with anyone that has tested positive in the last 10 days please notify you surgeon.

## 2023-05-30 ENCOUNTER — Encounter (HOSPITAL_COMMUNITY): Payer: Self-pay

## 2023-05-30 ENCOUNTER — Other Ambulatory Visit: Payer: Self-pay

## 2023-05-30 ENCOUNTER — Encounter (HOSPITAL_COMMUNITY)
Admission: RE | Admit: 2023-05-30 | Discharge: 2023-05-30 | Disposition: A | Discharge status: Home / Self Care | Payer: Medicare Other | Source: Ambulatory Visit | Attending: Orthopedic Surgery | Admitting: Orthopedic Surgery

## 2023-05-30 VITALS — BP 148/64 | HR 62 | Temp 97.6°F | Resp 17 | Ht 67.0 in | Wt 147.5 lb

## 2023-05-30 DIAGNOSIS — I447 Left bundle-branch block, unspecified: Secondary | ICD-10-CM | POA: Insufficient documentation

## 2023-05-30 DIAGNOSIS — I10 Essential (primary) hypertension: Secondary | ICD-10-CM | POA: Insufficient documentation

## 2023-05-30 DIAGNOSIS — Z01812 Encounter for preprocedural laboratory examination: Secondary | ICD-10-CM | POA: Insufficient documentation

## 2023-05-30 DIAGNOSIS — Z01818 Encounter for other preprocedural examination: Secondary | ICD-10-CM

## 2023-05-30 DIAGNOSIS — R600 Localized edema: Secondary | ICD-10-CM | POA: Diagnosis not present

## 2023-05-30 DIAGNOSIS — Z7901 Long term (current) use of anticoagulants: Secondary | ICD-10-CM | POA: Insufficient documentation

## 2023-05-30 DIAGNOSIS — I083 Combined rheumatic disorders of mitral, aortic and tricuspid valves: Secondary | ICD-10-CM | POA: Diagnosis not present

## 2023-05-30 LAB — BASIC METABOLIC PANEL
Anion gap: 9 (ref 5–15)
BUN: 17 mg/dL (ref 8–23)
CO2: 23 mmol/L (ref 22–32)
Calcium: 9.2 mg/dL (ref 8.9–10.3)
Chloride: 105 mmol/L (ref 98–111)
Creatinine, Ser: 1.05 mg/dL — ABNORMAL HIGH (ref 0.44–1.00)
GFR, Estimated: 52 mL/min — ABNORMAL LOW (ref >60)
Glucose, Bld: 134 mg/dL — ABNORMAL HIGH (ref 70–99)
Potassium: 4.2 mmol/L (ref 3.5–5.1)
Sodium: 137 mmol/L (ref 135–145)

## 2023-05-30 LAB — CBC
HCT: 40.2 % (ref 36.0–46.0)
Hemoglobin: 13.2 g/dL (ref 12.0–15.0)
MCH: 29.1 pg (ref 26.0–34.0)
MCHC: 32.8 g/dL (ref 30.0–36.0)
MCV: 88.5 fL (ref 80.0–100.0)
Platelets: 347 10*3/uL (ref 150–400)
RBC: 4.54 MIL/uL (ref 3.87–5.11)
RDW: 14.3 % (ref 11.5–15.5)
WBC: 9 10*3/uL (ref 4.0–10.5)
nRBC: 0 % (ref 0.0–0.2)

## 2023-05-30 NOTE — Progress Notes (Signed)
PCP - [name], FNP Cardiologist - Dr. Wyline Mood  PPM/ICD - denies Device Orders - na Rep Notified - na  Chest x-ray - 04/01/2023 EKG - 04/01/2023 Stress Test -  ECHO - 03/28/2022 Cardiac Cath -   Sleep Study - denies CPAP - na  Non-diabetic  Blood Thinner Instructions: Eliquis, last dose 05/31/2023 Aspirin Instructions:denies  ERAS Protcol - Clears until 0930  COVID TEST- na  Anesthesia review: Yes. HTN, A-fib, carotid artery occlusion   Patient denies shortness of breath, fever, cough and chest pain at PAT appointment   All instructions explained to the patient, with a verbal understanding of the material. Patient agrees to go over the instructions while at home for a better understanding. Patient also instructed to self quarantine after being tested for COVID-19. The opportunity to ask questions was provided.

## 2023-06-02 DIAGNOSIS — M47812 Spondylosis without myelopathy or radiculopathy, cervical region: Secondary | ICD-10-CM | POA: Diagnosis not present

## 2023-06-02 DIAGNOSIS — M5412 Radiculopathy, cervical region: Secondary | ICD-10-CM | POA: Diagnosis not present

## 2023-06-02 NOTE — Progress Notes (Signed)
Anesthesia Chart Review:  87 year old female follows with cardiology for history of atrial fibrillation (maintained on amiodarone and Eliquis), left bundle branch block, HTN, bilateral LE edema, carotid stenosis s/p prior left CEA (Doppler 05/2022 showed bilateral 1 to 39% stenosis), moderate mitral regurgitation.  Preop restratification per telephone encounter by Edd Fabian, NP on 05/21/2023, "Chart reviewed as part of pre-operative protocol coverage. Given past medical history and time since last visit, based on ACC/AHA guidelines, [name] would be at acceptable risk for the planned procedure without further cardiovascular testing. Patient with diagnosis of Afib on Eliquis for anticoagulation.Marland KitchenMarland KitchenPer office protocol, patient can hold Eliquis for 2 days prior to procedure."  Patient reports last dose Eliquis 05/31/2023.  Other pertinent history includes GERD, migraines.  Preop labs reviewed, unremarkable.  EKG 04/01/2023: NSR.  Rate 88.  Left axis deviation.  Left bundle branch block.  Carotid duplex 06/13/2022: Summary:  Right Carotid: Velocities in the right ICA are consistent with a 1-39% stenosis.  Left Carotid: Velocities in the left ICA are consistent with a 1-39% stenosis.  Vertebrals: Bilateral vertebral arteries demonstrate antegrade flow.  Subclavians: Normal flow hemodynamics were seen in bilateral subclavian arteries.   TTE 03/28/2022: 1. Left ventricular ejection fraction, by estimation, is 55 to 60%. The  left ventricle has normal function. The left ventricle has no regional  wall motion abnormalities. There is mild asymmetric left ventricular  hypertrophy of the basal segment. Left  ventricular diastolic parameters are consistent with Grade II diastolic  dysfunction (pseudonormalization). The average left ventricular global  longitudinal strain is -19.8 %. The global longitudinal strain is normal.   2. Right ventricular systolic function is normal. The right  ventricular  size is normal. There is mildly elevated pulmonary artery systolic  pressure. The estimated right ventricular systolic pressure is 40.7 mmHg.   3. Left atrial size was mildly dilated.   4. The mitral valve is grossly normal. Mild mitral valve regurgitation.   5. Tricuspid valve regurgitation is mild to moderate.   6. The aortic valve is tricuspid. Aortic valve regurgitation is not  visualized. Aortic valve sclerosis is present, with no evidence of aortic  valve stenosis.   7. The inferior vena cava is normal in size with greater than 50%  respiratory variability, suggesting right atrial pressure of 3 mmHg.     Zannie Cove [facility] Short Stay Center/Anesthesiology Phone [phone] 06/02/2023 9:04 AM

## 2023-06-02 NOTE — Anesthesia Preprocedure Evaluation (Addendum)
Anesthesia Evaluation  Patient identified by MRN, date of birth, ID band Patient awake    Reviewed: Allergy & Precautions, NPO status , Patient's Chart, lab work & pertinent test results, reviewed documented beta blocker date and time   Airway Mallampati: III  TM Distance: >3 FB Neck ROM: Full    Dental  (+) Teeth Intact, Dental Advisory Given   Pulmonary neg pulmonary ROS   Pulmonary exam normal breath sounds clear to auscultation       Cardiovascular hypertension, Pt. on medications and Pt. on home beta blockers + Peripheral Vascular Disease and +CHF  Normal cardiovascular exam+ dysrhythmias (eliquis) Atrial Fibrillation  Rhythm:Regular Rate:Normal  TTE 2023 1. Left ventricular ejection fraction, by estimation, is 55 to 60%. The  left ventricle has normal function. The left ventricle has no regional  wall motion abnormalities. There is mild asymmetric left ventricular  hypertrophy of the basal segment. Left  ventricular diastolic parameters are consistent with Grade II diastolic  dysfunction (pseudonormalization). The average left ventricular global  longitudinal strain is -19.8 %. The global longitudinal strain is normal.   2. Right ventricular systolic function is normal. The right ventricular  size is normal. There is mildly elevated pulmonary artery systolic  pressure. The estimated right ventricular systolic pressure is 40.7 mmHg.   3. Left atrial size was mildly dilated.   4. The mitral valve is grossly normal. Mild mitral valve regurgitation.   5. Tricuspid valve regurgitation is mild to moderate.   6. The aortic valve is tricuspid. Aortic valve regurgitation is not  visualized. Aortic valve sclerosis is present, with no evidence of aortic  valve stenosis.   7. The inferior vena cava is normal in size with greater than 50%  respiratory variability, suggesting right atrial pressure of 3 mmHg.     Neuro/Psych   Headaches  negative psych ROS   GI/Hepatic Neg liver ROS,GERD  ,,  Endo/Other  negative endocrine ROS    Renal/GU negative Renal ROS  negative genitourinary   Musculoskeletal  (+) Arthritis ,    Abdominal   Peds  Hematology  (+) Blood dyscrasia (Eliquis)   Anesthesia Other Findings 87 year old female follows with cardiology for history of atrial fibrillation (maintained on amiodarone and Eliquis), left bundle branch block, HTN, bilateral LE edema, carotid stenosis s/p prior left CEA (Doppler 05/2022 showed bilateral 1 to 39% stenosis), moderate mitral regurgitation.  Reproductive/Obstetrics                             Anesthesia Physical Anesthesia Plan  ASA: 3  Anesthesia Plan: General   Post-op Pain Management: Tylenol PO (pre-op)*   Induction: Intravenous  PONV Risk Score and Plan: 3 and Ondansetron and Dexamethasone  Airway Management Planned: LMA  Additional Equipment:   Intra-op Plan:   Post-operative Plan: Extubation in OR  Informed Consent: I have reviewed the patients History and Physical, chart, labs and discussed the procedure including the risks, benefits and alternatives for the proposed anesthesia with the patient or authorized representative who has indicated his/her understanding and acceptance.     Dental advisory given  Plan Discussed with: CRNA  Anesthesia Plan Comments: (PAT note by Antionette Poles, PA-C: 87 year old female follows with cardiology for history of atrial fibrillation (maintained on amiodarone and Eliquis), left bundle branch block, HTN, bilateral LE edema, carotid stenosis s/p prior left CEA (Doppler 05/2022 showed bilateral 1 to 39% stenosis), moderate mitral regurgitation.  Preop restratification per telephone encounter by  Edd Fabian, NP on 05/21/2023, "Chart reviewed as part of pre-operative protocol coverage. Given past medical history and time since last visit, based on ACC/AHA guidelines, [name]  [place] would be at acceptable risk for the planned procedure without further cardiovascular testing. Patient with diagnosis of Afib on Eliquis for anticoagulation.Marland KitchenMarland KitchenPer office protocol, patient can hold Eliquis for 2 days prior to procedure."  Patient reports last dose Eliquis 05/31/2023.  Other pertinent history includes GERD, migraines.  Preop labs reviewed, unremarkable.  EKG 04/01/2023: NSR.  Rate 88.  Left axis deviation.  Left bundle branch block.  Carotid duplex 06/13/2022: Summary:  Right Carotid: Velocities in the right ICA are consistent with a 1-39% stenosis.  Left Carotid: Velocities in the left ICA are consistent with a 1-39% stenosis.  Vertebrals:Bilateral vertebral arteries demonstrate antegrade flow.  Subclavians: Normal flow hemodynamics were seen in bilateral subclavian arteries.   TTE 03/28/2022: 1. Left ventricular ejection fraction, by estimation, is 55 to 60%. The  left ventricle has normal function. The left ventricle has no regional  wall motion abnormalities. There is mild asymmetric left ventricular  hypertrophy of the basal segment. Left  ventricular diastolic parameters are consistent with Grade II diastolic  dysfunction (pseudonormalization). The average left ventricular global  longitudinal strain is -19.8 %. The global longitudinal strain is normal.  2. Right ventricular systolic function is normal. The right ventricular  size is normal. There is mildly elevated pulmonary artery systolic  pressure. The estimated right ventricular systolic pressure is 40.7 mmHg.  3. Left atrial size was mildly dilated.  4. The mitral valve is grossly normal. Mild mitral valve regurgitation.  5. Tricuspid valve regurgitation is mild to moderate.  6. The aortic valve is tricuspid. Aortic valve regurgitation is not  visualized. Aortic valve sclerosis is present, with no evidence of aortic  valve stenosis.  7. The inferior vena cava is normal in size with greater than  50%  respiratory variability, suggesting right atrial pressure of 3 mmHg.   )        Anesthesia Quick Evaluation

## 2023-06-03 ENCOUNTER — Encounter (HOSPITAL_COMMUNITY)
Admission: RE | Disposition: A | Discharge status: Home / Self Care | Payer: Self-pay | Source: Home / Self Care | Attending: Orthopedic Surgery

## 2023-06-03 ENCOUNTER — Ambulatory Visit (HOSPITAL_COMMUNITY): Payer: Self-pay | Admitting: Anesthesiology

## 2023-06-03 ENCOUNTER — Ambulatory Visit (HOSPITAL_COMMUNITY)
Admission: RE | Admit: 2023-06-03 | Discharge: 2023-06-03 | Disposition: A | Discharge status: Home / Self Care | Payer: Medicare Other | Attending: Orthopedic Surgery | Admitting: Orthopedic Surgery

## 2023-06-03 ENCOUNTER — Other Ambulatory Visit: Payer: Self-pay

## 2023-06-03 ENCOUNTER — Encounter (HOSPITAL_COMMUNITY): Payer: Self-pay | Admitting: Orthopedic Surgery

## 2023-06-03 ENCOUNTER — Ambulatory Visit (HOSPITAL_COMMUNITY): Payer: Self-pay | Admitting: Physician Assistant

## 2023-06-03 DIAGNOSIS — Z7901 Long term (current) use of anticoagulants: Secondary | ICD-10-CM | POA: Diagnosis not present

## 2023-06-03 DIAGNOSIS — I4891 Unspecified atrial fibrillation: Secondary | ICD-10-CM | POA: Diagnosis not present

## 2023-06-03 DIAGNOSIS — I081 Rheumatic disorders of both mitral and tricuspid valves: Secondary | ICD-10-CM | POA: Insufficient documentation

## 2023-06-03 DIAGNOSIS — I110 Hypertensive heart disease with heart failure: Secondary | ICD-10-CM | POA: Insufficient documentation

## 2023-06-03 DIAGNOSIS — I5043 Acute on chronic combined systolic (congestive) and diastolic (congestive) heart failure: Secondary | ICD-10-CM | POA: Diagnosis not present

## 2023-06-03 DIAGNOSIS — W19XXXA Unspecified fall, initial encounter: Secondary | ICD-10-CM | POA: Insufficient documentation

## 2023-06-03 DIAGNOSIS — R519 Headache, unspecified: Secondary | ICD-10-CM | POA: Insufficient documentation

## 2023-06-03 DIAGNOSIS — I509 Heart failure, unspecified: Secondary | ICD-10-CM | POA: Insufficient documentation

## 2023-06-03 DIAGNOSIS — I739 Peripheral vascular disease, unspecified: Secondary | ICD-10-CM | POA: Diagnosis not present

## 2023-06-03 DIAGNOSIS — S8002XA Contusion of left knee, initial encounter: Secondary | ICD-10-CM

## 2023-06-03 DIAGNOSIS — Z79899 Other long term (current) drug therapy: Secondary | ICD-10-CM | POA: Insufficient documentation

## 2023-06-03 SURGERY — INCISION AND DRAINAGE OF DEEP ABSCESS, ANKLE
Anesthesia: General | Laterality: Left

## 2023-06-03 MED ORDER — LIDOCAINE 2% (20 MG/ML) 5 ML SYRINGE
INTRAMUSCULAR | Status: DC | PRN
  Administered 2023-06-03: 60 mg via INTRAVENOUS

## 2023-06-03 MED ORDER — PROPOFOL 10 MG/ML IV BOLUS
INTRAVENOUS | Status: DC | PRN
  Administered 2023-06-03: 110 mg via INTRAVENOUS

## 2023-06-03 MED ORDER — FENTANYL CITRATE (PF) 250 MCG/5ML IJ SOLN
INTRAMUSCULAR | Status: DC | PRN
  Administered 2023-06-03 (×2): 50 ug via INTRAVENOUS

## 2023-06-03 MED ORDER — 0.9 % SODIUM CHLORIDE (POUR BTL) OPTIME
TOPICAL | Status: DC | PRN
Start: 2023-06-03 — End: 2023-06-03
  Administered 2023-06-03: 1000 mL

## 2023-06-03 MED ORDER — ONDANSETRON HCL 4 MG/2ML IJ SOLN
INTRAMUSCULAR | Status: DC | PRN
  Administered 2023-06-03: 4 mg via INTRAVENOUS

## 2023-06-03 MED ORDER — ORAL CARE MOUTH RINSE: 15.0000 mL | Freq: Once | OROMUCOSAL | Status: AC

## 2023-06-03 MED ORDER — BUPIVACAINE-EPINEPHRINE (PF) 0.25% -1:200000 IJ SOLN
INTRAMUSCULAR | Status: AC
  Filled 2023-06-03: qty 30

## 2023-06-03 MED ORDER — OXYCODONE HCL 5 MG PO TABS: 5.0000 mg | ORAL_TABLET | ORAL | 0 refills | Status: AC | PRN

## 2023-06-03 MED ORDER — PHENYLEPHRINE 80 MCG/ML (10ML) SYRINGE FOR IV PUSH (FOR BLOOD PRESSURE SUPPORT)
PREFILLED_SYRINGE | INTRAVENOUS | Status: DC | PRN
  Administered 2023-06-03: 80 ug via INTRAVENOUS
  Administered 2023-06-03 (×2): 40 ug via INTRAVENOUS

## 2023-06-03 MED ORDER — SODIUM CHLORIDE 0.9 % IV SOLN: INTRAVENOUS | Status: DC | PRN

## 2023-06-03 MED ORDER — CEFAZOLIN SODIUM-DEXTROSE 2-3 GM-%(50ML) IV SOLR
INTRAVENOUS | Status: DC | PRN
  Administered 2023-06-03: 2 g via INTRAVENOUS

## 2023-06-03 MED ORDER — ACETAMINOPHEN 500 MG PO TABS: 1000.0000 mg | ORAL_TABLET | Freq: Three times a day (TID) | ORAL | 0 refills | Status: AC

## 2023-06-03 MED ORDER — CHLORHEXIDINE GLUCONATE 0.12 % MT SOLN
15.0000 mL | Freq: Once | OROMUCOSAL | Status: AC
  Administered 2023-06-03: 15 mL via OROMUCOSAL
  Filled 2023-06-03: qty 15

## 2023-06-03 MED ORDER — ACETAMINOPHEN 500 MG PO TABS
1000.0000 mg | ORAL_TABLET | Freq: Once | ORAL | Status: AC
  Administered 2023-06-03: 1000 mg via ORAL
  Filled 2023-06-03: qty 2

## 2023-06-03 MED ORDER — FENTANYL CITRATE (PF) 100 MCG/2ML IJ SOLN: 25.0000 ug | INTRAMUSCULAR | Status: DC | PRN

## 2023-06-03 MED ORDER — BUPIVACAINE-EPINEPHRINE 0.25% -1:200000 IJ SOLN
INTRAMUSCULAR | Status: DC | PRN
  Administered 2023-06-03 (×2): 10 mL

## 2023-06-03 MED ORDER — POLYETHYLENE GLYCOL 3350 17 G PO PACK: 17.0000 g | PACK | Freq: Every day | ORAL | 0 refills | Status: AC

## 2023-06-03 MED ORDER — FENTANYL CITRATE (PF) 250 MCG/5ML IJ SOLN
INTRAMUSCULAR | Status: AC
  Filled 2023-06-03: qty 5

## 2023-06-03 MED ORDER — SENNA 8.6 MG PO TABS: 1.0000 | ORAL_TABLET | Freq: Two times a day (BID) | ORAL | 0 refills | Status: AC

## 2023-06-03 SURGICAL SUPPLY — 44 items
ALCOHOL 70% 16 OZ (MISCELLANEOUS) ×2 IMPLANT
BAG COUNTER SPONGE SURGICOUNT (BAG) ×2 IMPLANT
BENZOIN TINCTURE PRP APPL 2/3 (GAUZE/BANDAGES/DRESSINGS) IMPLANT
BNDG COHESIVE 4X5 TAN STRL LF (GAUZE/BANDAGES/DRESSINGS) IMPLANT
CLSR STERI-STRIP ANTIMIC 1/2X4 (GAUZE/BANDAGES/DRESSINGS) IMPLANT
COVER SURGICAL LIGHT HANDLE (MISCELLANEOUS) ×2 IMPLANT
DRAPE C-ARM 42X72 X-RAY (DRAPES) IMPLANT
DRAPE IMP U-DRAPE 54X76 (DRAPES) ×2 IMPLANT
DRAPE U-SHAPE 47X51 STRL (DRAPES) ×2 IMPLANT
DRSG AQUACEL AG ADV 3.5X 6 (GAUZE/BANDAGES/DRESSINGS) IMPLANT
DRSG TEGADERM 4X4.75 (GAUZE/BANDAGES/DRESSINGS) IMPLANT
DURAPREP 26ML APPLICATOR (WOUND CARE) ×2 IMPLANT
ELECT REM PT RETURN 9FT ADLT (ELECTROSURGICAL) ×1
ELECTRODE REM PT RTRN 9FT ADLT (ELECTROSURGICAL) ×2 IMPLANT
EVACUATOR 1/8 PVC DRAIN (DRAIN) IMPLANT
GAUZE PAD ABD 8X10 STRL (GAUZE/BANDAGES/DRESSINGS) IMPLANT
GAUZE SPONGE 4X4 12PLY STRL (GAUZE/BANDAGES/DRESSINGS) IMPLANT
GLOVE BIO SURGEON STRL SZ7.5 (GLOVE) ×2 IMPLANT
GLOVE INDICATOR 7.5 STRL GRN (GLOVE) ×2 IMPLANT
GOWN STRL REUS W/ TWL XL LVL3 (GOWN DISPOSABLE) ×2 IMPLANT
KIT BASIN OR (CUSTOM PROCEDURE TRAY) ×2 IMPLANT
KIT TURNOVER KIT B (KITS) ×2 IMPLANT
MANIFOLD NEPTUNE II (INSTRUMENTS) ×2 IMPLANT
NDL 22X1.5 STRL (OR ONLY) (MISCELLANEOUS) IMPLANT
NEEDLE 22X1.5 STRL (OR ONLY) (MISCELLANEOUS) ×1 IMPLANT
NS IRRIG 1000ML POUR BTL (IV SOLUTION) ×2 IMPLANT
PACK ORTHO EXTREMITY (CUSTOM PROCEDURE TRAY) ×2 IMPLANT
PAD ARMBOARD 7.5X6 YLW CONV (MISCELLANEOUS) ×2 IMPLANT
RESTRAINT HEAD UNIVERSAL NS (MISCELLANEOUS) ×2 IMPLANT
SPONGE T-LAP 18X18 ~~LOC~~+RFID (SPONGE) ×4 IMPLANT
SPONGE T-LAP 4X18 ~~LOC~~+RFID (SPONGE) IMPLANT
STAPLER VISISTAT 35W (STAPLE) ×2 IMPLANT
STOCKINETTE IMPERVIOUS 9X36 MD (GAUZE/BANDAGES/DRESSINGS) IMPLANT
SUCTION TUBE FRAZIER 10FR DISP (SUCTIONS) IMPLANT
SUT ETHILON 2 0 FS 18 (SUTURE) IMPLANT
SUT MNCRL AB 3-0 PS2 18 (SUTURE) IMPLANT
SUT VIC AB 0 CT1 18XCR BRD 8 (SUTURE) ×2 IMPLANT
SUT VIC AB 0 CT1 18XCR BRD8 (SUTURE) IMPLANT
SUT VIC AB 2-0 CT1 18 (SUTURE) ×2 IMPLANT
SUT VIC AB 2-0 CT2 18 VCP726D (SUTURE) IMPLANT
TOWEL GREEN STERILE (TOWEL DISPOSABLE) ×2 IMPLANT
TOWEL GREEN STERILE FF (TOWEL DISPOSABLE) ×2 IMPLANT
WATER STERILE IRR 1000ML POUR (IV SOLUTION) ×2 IMPLANT
YANKAUER SUCT BULB TIP NO VENT (SUCTIONS) ×2 IMPLANT

## 2023-06-03 NOTE — Transfer of Care (Addendum)
Immediate Anesthesia Transfer of Care Note  Patient: [name]  Procedure(s) Performed: LEFT KNEE INCISION AND DRAINAGE OF DEEP HEMATOMA (Left)  Patient Location: PACU  Anesthesia Type:General  Level of Consciousness: awake  Airway & Oxygen Therapy: Patient connected to face mask oxygen  Post-op Assessment: Post -op Vital signs reviewed and stable  Post vital signs: stable  Last Vitals:  Vitals Value Taken Time  BP 151/63 06/03/23 1603  Temp    Pulse 67 06/03/23 1604  Resp 14 06/03/23 1604  SpO2 95 % 06/03/23 1604  Vitals shown include unfiled device data.  Last Pain:  Vitals:   06/03/23 1055  PainSc: 0-No pain         Complications: There were no known notable events for this encounter.

## 2023-06-03 NOTE — Op Note (Signed)
Orthopedic Surgery Operative Report  Procedure: Left knee incision and drainage of hematoma  Modifier: none  Date of procedure: 06/03/2023  Patient name: Lydia Graham MRN: [id number] DOB: Feb 25, 1936  Surgeon: Willia Craze, MD Assistant: None Pre-operative diagnosis: left knee hematoma Post-operative diagnosis: same as above Findings: scar tissue in the prepatellar space with an old hematoma   Specimens: none Anesthesia: general EBL: 10cc Complications: none Pre-incision antibiotic: ancef  Implants: none   Indication for procedure: Patient is a 87 y.o. female who had been followed in the office for left knee pain. She had a hematoma and cellulitis. She recovered from the cellulitis. However, she had a persistent hematoma and was having knee pain. Non-operative treatment was tried for several months, but her symptoms persisted, so operative management was discussed as an option. The risks, benefits, and alternatives of surgery were covered with her. After this discussion, patient elected to proceed. All the patient's questions were answered to her satisfaction. After this discussion, the patient expressed understanding and elected to proceed with surgical intervention.   Procedure Description: The patient was met in the pre-operative holding area. The patient's identity and consent were verified. The operative site was marked. The patient's remaining questions about the surgery were answered. The patient was brought back to the operating room. Anesthesia was induced and a LMA was placed by the anesthesia staff. The patient was transferred to the operating table in the supine position. All bony prominences were well padded. The surgical area was cleansed with alcohol. The patient's skin was then prepped and draped in a standard, sterile fashion. A time out was performed that identified the patient, the procedure, and the laterality. All team members agreed with what was stated in the  time out.   10cc of 0.25% Marcaine with epinephrine was injected into the skin around the anterior knee swelling. A midline incision centered over the patella from the caudal aspect of the patella to the tibial tubercle was made. Incision was carried sharply down the skin and dermis. A pocket was encountered as incision was taken down. Old coagulated blood was seen in this pocket. A rongeur was used to remove this hematoma. A knife was used to continue the incision to the level of the patellar tendon. A curette was then used to remove some further hematoma and loose tissue. There was scar tissue surrounding the pocket. A 15 blade knife was used to excise the scar tissue sharply. There were healthy bleeding edges throughout the wound. No further hematoma was seen. 10cc more of local anesthesia was injected into the tissues.  The wound was copiously irrigated with sterile saline. A medium hemovac drain was placed into the wound and placed out the lateral aspect of the knee. 0 vicryl was used to tack the superficial part of the pocket to the deep to close down the dead space. 2-0 vicryl was used to reapproximated the deep dermal layer. The skin as closed with a 3-0 running monocryl. All counts were correct at the end of the case. The incision was dressed with steri strips and benzoine. An aquacel dressing was placed over the wound. The patient was transferred back to a bed and brought to the post-anesthesia care unit by anesthesia staff in stable condition.  Post-operative plan: The patient will recover in the post-anesthesia care unit and then will go home once her vitals are stable, she voids spontaneously, her pain is under control, and she is able to ambulate the halls. The patient will be weight bearing  as tolerated with no brace. The patient will discharge with the drain with plan to remove it in the office. The patient will show up to the office when there is 0 output over 24 hours and I will remove it.  The patient will next be seen in the office.      Willia Craze, MD Orthopedic Surgeon

## 2023-06-03 NOTE — Anesthesia Procedure Notes (Signed)
Procedure Name: LMA Insertion Date/Time: 06/03/2023 3:10 PM  Performed by: Hessie Diener, CRNAPre-anesthesia Checklist: Patient identified, Emergency Drugs available, Suction available and Patient being monitored Patient Re-evaluated:Patient Re-evaluated prior to induction Oxygen Delivery Method: Circle System Utilized Preoxygenation: Pre-oxygenation with 100% oxygen Induction Type: IV induction Ventilation: Mask ventilation without difficulty LMA: LMA inserted LMA Size: 4.0 Number of attempts: 1 Airway Equipment and Method: Bite block Placement Confirmation: positive ETCO2 Tube secured with: Tape Dental Injury: Teeth and Oropharynx as per pre-operative assessment

## 2023-06-03 NOTE — Discharge Summary (Signed)
Orthopedic Surgery Discharge Summary  Patient name: Lydia Graham Patient MRN: [id number] Surgery date: 06/03/2023 Discharge date: 06/03/23  Attending physician: Willia Craze, MD Final diagnosis: left knee prepatellar hematoma Findings: small hematoma in the prepatellar space, scar tissue in the prepatellar space as well  Hospital course: Patient is a 87 y.o. female who underwent left prepatellar knee incision and drainage. Plan was for outpatient surgery. There were no intraoperative complications so plan was still for discharge from PACU. The patient had significant pain immediately after surgery, but pain eventually was controlled with a multimodal regimen including oxycodone. The patient was tolerated PO, voiding spontaneously, and was able to ambulate in PACU. The patient's vitals were stable in PACU. The patient's drains was left in place as planned and will be removed in the office at a later date. The patient was medically ready for discharge and was discharge to home on the same day as surgery.  Instructions:   Orthopedic Surgery Discharge Instructions  Patient name: Lydia Graham Procedure Performed: left knee incision and drainage Date of Surgery: 06/03/2023 Surgeon: Willia Craze, MD  Pre-operative Diagnosis: left knee prepatellar hematoma Post-operative Diagnosis: same as above  Discharge Date: 06/03/2023 Discharged to: home Discharge Condition: stable  Activity: You should refrain from kneeling on your left knee. You are encouraged to walk as much as desired. You can perform household activities such as cleaning dishes, doing laundry, vacuuming, etc. as long as the ten-pound restriction is followed. You do not need to wear a brace during the post-operative period.   Incision Care: Your incision site has a dressing over it. That dressing should remain in place and dry at all times for a total of one week after surgery. After one week, you can remove the dressing.  Underneath the dressing, you will find pieces of tape. You should leave these pieces of tape in place. They will fall off with time. Do not pick, rub, or scrub at them. Do not put cream or lotion over the surgical area. If your drain has been removed, after one week and once the dressing is off, it is okay to let soap and water run over your incision. You should not shower while your drain is in place even if it has been over one week and it is okay to remove your dressing. Do not pick, scrub, or rub at the pieces of tape when bathing. Do not submerge (e.g., take a bath, swim, go in a hot tub, etc.) until six weeks after surgery. There may be some bloody drainage from the incision into the dressing after surgery. This is normal. You do not need to replace the dressing. Continue to leave it in place for the one week as instructed above. Should the dressing become saturated with blood or drainage, please call the office for further instructions.   Drains: You are being discharged home with a drain. To empty this drains remove cap off of the canister. Dump the blood from the canister into a sink or another container to be disposed of. Apply pressure to the canister to compress the springs. With the canister compressed, reattach the cap to create a seal. Only empty the drains as needed. (Drain emptying demonstration [email or web address]). You elected to have the drains removed in the office. You should come to the office when the drains put out near nothing over a 24 hour period. Dr. Christell Constant is in office Monday after 1pm until 4:15pm, Wednesday from 8:30am until 4:15pm, Thursday 8:15am until 4:15pm, and Friday  from 8:15 until 11am. You do not need an appointment and you do not need to call ahead. Simply show up and let the front desk know that I am expecting you and they will let you in so that I can remove the drain.    Medications: You have been prescribed oxycodone. This is a narcotic pain  medication and should only be taken as prescribed. You should not drink alcohol or operate heavy machinery (including driving) while taking this medication. The oxycodone can cause constipation as a side effect. For that reason, you have been prescribed senna and miralax. These are both laxatives. You do not need to take this medication if you develop diarrhea. Should you remain constipated even while taking these medications, please increase the dose of miralax to twice daily. Tylenol has been prescribed to be taken every 8 hours, which will give you additional pain relief.  In order to set expectations for opioid prescriptions, you will only be prescribed opioids for a total of six weeks after surgery and, at two-weeks after surgery, your opioid prescription will start to tapered (decreased dosage and number of pills). If you have ongoing need for opioid medication six weeks after surgery, you will be referred to pain management. If you are already established with a provider that is giving you opioid medications, you should schedule an appointment with them for six weeks after surgery if you feel you are going to need another prescription. State law only allows for opioid prescriptions one week at a time. If you are running out of opioid medication near the end of the week, please call the office during business hours before running out so I can send you another prescription.   You may resume any home blood thinners (warfarin, lovenox, apixaban, plavix, xarelto, etc) 24 hours after your surgery. Take these medications as they were previously prescribed.  Driving: You should not drive while taking narcotic pain medications. You should start getting back to driving slowly and you may want to try driving in a parking lot before doing anything more.   Diet: You are safe to resume your regular diet after surgery.   Reasons to Call the Office After Surgery: You should feel free to call the office with any  concerns or questions you have in the post-operative period, but you should definitely notify the office if you develop: -shortness of breath, chest pain, or trouble breathing -excessive bleeding, drainage, redness, or swelling around the surgical site -fevers, chills, or pain that is getting worse with each passing day -persistent nausea or vomiting -new weakness in the left leg -new or worsening numbness or tingling in the left leg -you drain has an issue -other concerns about your surgery  Follow Up Appointments: You should have an office appointment scheduled for approximately two weeks after surgery. If you do not remember when this appointment is or do not already have it scheduled, please call the office to schedule.   Office Information:  -Willia Craze, MD -Phone number: [phone] -Address: [address]       [place], [place] [address]

## 2023-06-03 NOTE — Brief Op Note (Signed)
06/03/2023  4:35 PM  PATIENT:  Lydia Graham  87 y.o. female  PRE-OPERATIVE DIAGNOSIS:  LEFT KNEE HEMATOMA  POST-OPERATIVE DIAGNOSIS:  LEFT KNEE HEMATOMA  PROCEDURE:  Procedure(s): LEFT KNEE INCISION AND DRAINAGE OF DEEP HEMATOMA (Left)  SURGEON:  Surgeons and Role:    London Sheer, MD - Primary  PHYSICIAN ASSISTANT: none  ASSISTANTS: none   ANESTHESIA:   IV sedation  EBL:  10 mL   BLOOD ADMINISTERED:none  DRAINS:  medium hemovac drain in the knee    LOCAL MEDICATIONS USED:  MARCAINE     SPECIMEN:  No Specimen  DISPOSITION OF SPECIMEN:  N/A  COUNTS:  YES  TOURNIQUET:  NONE  DICTATION: WRITTEN IN EPIC  PLAN OF CARE: Discharge to home after PACU  PATIENT DISPOSITION:  PACU - hemodynamically stable.   Delay start of Pharmacological VTE agent (>24hrs) due to surgical blood loss or risk of bleeding: no

## 2023-06-03 NOTE — H&P (Signed)
Orthopedic Surgery H&P Note  Assessment: Patient is a 87 y.o. female with left knee pain and prepatellar hematoma   Plan: -Planning for incision and drainage today with drain placement -Diet: NPO for procedure -DVT ppx: hold in anticipation of surgery -Ancef and TXA on call to OR -Weight bearing status: as tolerated -Went over the risks of surgery again today and patient elected to proceed -Consent verified -Site marked -To surgery when ready  ___________________________________________________________________________   Chief complaint: left knee pain  History:  Patient is a 87 y.o. female who had a fall and developed a prepatellar hematoma and cellulitis. Her cellulitis got treated with antibiotics and improved. She has had residual swelling over the anterior knee with pain. She has been followed in the office for months and has not noted any improved. At our last visit, surgery was discussed as a treatment option. She elected to proceed. She presents today with no change in her symptoms. Please see office notes for further details.   Review of systems: General: denies fevers and chills, myalgias Neurologic: denies recent changes in vision, slurred speech Abdomen: denies nausea, vomiting, hematemesis Respiratory: denies cough, shortness of breath  Past medical history:  Atrial fibrillation GERD Migraines HTN  Allergies: crestor, singulair, zestril   Past surgical history:  Appendectomy Lumbar spine surgery Hysterectomy Cataract surgery Endarterectomy Right TKA Tonsillectomy Nasal sinus surgery  Social history: Denies use of nicotine-containing products (cigarettes, vaping, smokeless, etc.) Alcohol use: denies Denies use of recreational drugs  Family history: -reviewed and not pertinent to knee hematoma   Physical Exam:  BMI of 23.0  General: no acute distress, appears stated age Neurologic: alert, answering questions appropriately, following  commands Cardiovascular: regular rate, no cyanosis Respiratory: unlabored breathing on room air, symmetric chest rise Psychiatric: appropriate affect, normal cadence to speech  MSK:   -Left lower extremity  No tenderness to palpation over extremity, except over the anterior patella  Protuberance anterior and just distal to the patella EHL/TA/GSC intact Plantarflexes and dorsiflexes toes Sensation intact to light touch in sural, saphenous, tibial, deep peroneal, and superficial peroneal nerve distributions Foot warm and well perfused    Patient name: Lydia Graham Patient MRN: [id number] Date: 06/03/23

## 2023-06-03 NOTE — Discharge Instructions (Signed)
Orthopedic Surgery Discharge Instructions  Patient name: Lydia Graham Procedure Performed: left knee incision and drainage Date of Surgery: 06/03/2023 Surgeon: Willia Craze, MD  Pre-operative Diagnosis: left knee prepatellar hematoma Post-operative Diagnosis: same as above  Discharge Date: 06/03/2023 Discharged to: home Discharge Condition: stable  Activity: You should refrain from kneeling on your left knee. You are encouraged to walk as much as desired. You can perform household activities such as cleaning dishes, doing laundry, vacuuming, etc. as long as the ten-pound restriction is followed. You do not need to wear a brace during the post-operative period.   Incision Care: Your incision site has a dressing over it. That dressing should remain in place and dry at all times for a total of one week after surgery. After one week, you can remove the dressing. Underneath the dressing, you will find pieces of tape. You should leave these pieces of tape in place. They will fall off with time. Do not pick, rub, or scrub at them. Do not put cream or lotion over the surgical area. If your drain has been removed, after one week and once the dressing is off, it is okay to let soap and water run over your incision. You should not shower while your drain is in place even if it has been over one week and it is okay to remove your dressing. Do not pick, scrub, or rub at the pieces of tape when bathing. Do not submerge (e.g., take a bath, swim, go in a hot tub, etc.) until six weeks after surgery. There may be some bloody drainage from the incision into the dressing after surgery. This is normal. You do not need to replace the dressing. Continue to leave it in place for the one week as instructed above. Should the dressing become saturated with blood or drainage, please call the office for further instructions.   Drains: You are being discharged home with a drain. To empty this drains remove cap off of  the canister. Dump the blood from the canister into a sink or another container to be disposed of. Apply pressure to the canister to compress the springs. With the canister compressed, reattach the cap to create a seal. Only empty the drains as needed. (Drain emptying demonstration [email or web address]). You elected to have the drains removed in the office. You should come to the office when the drains put out near nothing over a 24 hour period. Dr. Christell Constant is in office Monday after 1pm until 4:15pm, Wednesday from 8:30am until 4:15pm, Thursday 8:15am until 4:15pm, and Friday from 8:15 until 11am. You do not need an appointment and you do not need to call ahead. Simply show up and let the front desk know that I am expecting you and they will let you in so that I can remove the drain.    Medications: You have been prescribed oxycodone. This is a narcotic pain medication and should only be taken as prescribed. You should not drink alcohol or operate heavy machinery (including driving) while taking this medication. The oxycodone can cause constipation as a side effect. For that reason, you have been prescribed senna and miralax. These are both laxatives. You do not need to take this medication if you develop diarrhea. Should you remain constipated even while taking these medications, please increase the dose of miralax to twice daily. Tylenol has been prescribed to be taken every 8 hours, which will give you additional pain relief.  In order to set expectations for opioid prescriptions, you will  only be prescribed opioids for a total of six weeks after surgery and, at two-weeks after surgery, your opioid prescription will start to tapered (decreased dosage and number of pills). If you have ongoing need for opioid medication six weeks after surgery, you will be referred to pain management. If you are already established with a provider that is giving you opioid medications, you should schedule  an appointment with them for six weeks after surgery if you feel you are going to need another prescription. State law only allows for opioid prescriptions one week at a time. If you are running out of opioid medication near the end of the week, please call the office during business hours before running out so I can send you another prescription.   You may resume any home blood thinners (warfarin, lovenox, apixaban, plavix, xarelto, etc) 24 hours after your surgery. Take these medications as they were previously prescribed.  Driving: You should not drive while taking narcotic pain medications. You should start getting back to driving slowly and you may want to try driving in a parking lot before doing anything more.   Diet: You are safe to resume your regular diet after surgery.   Reasons to Call the Office After Surgery: You should feel free to call the office with any concerns or questions you have in the post-operative period, but you should definitely notify the office if you develop: -shortness of breath, chest pain, or trouble breathing -excessive bleeding, drainage, redness, or swelling around the surgical site -fevers, chills, or pain that is getting worse with each passing day -persistent nausea or vomiting -new weakness in the left leg -new or worsening numbness or tingling in the left leg -you drain has an issue -other concerns about your surgery  Follow Up Appointments: You should have an office appointment scheduled for approximately two weeks after surgery. If you do not remember when this appointment is or do not already have it scheduled, please call the office to schedule.   Office Information:  -Willia Craze, MD -Phone number: [phone] -Address: [address]       [place], [place] [address]

## 2023-06-04 ENCOUNTER — Telehealth: Payer: Self-pay | Admitting: Orthopedic Surgery

## 2023-06-04 NOTE — Telephone Encounter (Signed)
Patient called in with concerns about her drainage tube from surgery. She doesn't feel like the blood is being drained via the tube.  She also has a question regarding her pain medicine.  [phone]

## 2023-06-04 NOTE — Anesthesia Postprocedure Evaluation (Signed)
Anesthesia Post Note  Patient: [name]  Procedure(s) Performed: LEFT KNEE INCISION AND DRAINAGE OF DEEP HEMATOMA (Left)     Patient location during evaluation: PACU Anesthesia Type: General Level of consciousness: awake and alert Pain management: pain level controlled Vital Signs Assessment: post-procedure vital signs reviewed and stable Respiratory status: spontaneous breathing, nonlabored ventilation, respiratory function stable and patient connected to nasal cannula oxygen Cardiovascular status: blood pressure returned to baseline and stable Postop Assessment: no apparent nausea or vomiting Anesthetic complications: no  There were no known notable events for this encounter.  Last Vitals:  Vitals:   06/03/23 1645 06/03/23 1654  BP: (!) 137/59 138/61  Pulse: 60 61  Resp: 13 13  Temp:  36.6 C  SpO2: 94% 93%    Last Pain:  Vitals:   06/03/23 1654  PainSc: 0-No pain   Pain Goal:                   Lydia Graham

## 2023-06-05 ENCOUNTER — Other Ambulatory Visit: Payer: Self-pay | Admitting: *Deleted

## 2023-06-05 MED ORDER — METOPROLOL SUCCINATE ER 25 MG PO TB24: 25.0000 mg | ORAL_TABLET | Freq: Every day | ORAL | 6 refills | Status: DC

## 2023-06-05 NOTE — Telephone Encounter (Signed)
Medication was authorized. Patient should be able to pick up now.

## 2023-06-06 ENCOUNTER — Telehealth: Payer: Self-pay | Admitting: Orthopedic Surgery

## 2023-06-06 NOTE — Telephone Encounter (Signed)
Patient's daughter Rudine called asked asked when can patient come into the office to have the drain removed?  The number to contact Rudine is [phone]

## 2023-06-10 ENCOUNTER — Telehealth: Payer: Self-pay | Admitting: Cardiology

## 2023-06-10 DIAGNOSIS — M47812 Spondylosis without myelopathy or radiculopathy, cervical region: Secondary | ICD-10-CM | POA: Insufficient documentation

## 2023-06-10 NOTE — Telephone Encounter (Signed)
   Pre-operative Risk Assessment  Last visit: 05/08/2023 Next visit: 08/08/2023 Patient Name: Lydia Graham  DOB: 1936/04/15 MRN: [id number]      Request for Surgical Clearance    Procedure:   CESI  C7-T1  Date of Surgery:  Clearance TBD                                 Surgeon:  Dr. Janey Genta Surgeon's Group or Practice Name:  [facility] Phone number:  [phone] Fax number:  [phone]   Type of Clearance Requested:   - Pharmacy:  Hold Apixaban (Eliquis) for 3 days prior   Type of Anesthesia:  Not Indicated   Additional requests/questions:    Sharen Hones   06/10/2023, 10:09 AM

## 2023-06-10 NOTE — Telephone Encounter (Signed)
   Patient Name: Lydia Graham  DOB: [birth year] MRN: [id number]  Primary Cardiologist: [name], MD  Clinical pharmacists have reviewed the patient's past medical history, labs, and current medications as part of preoperative protocol coverage. The following recommendations have been made:   Patient with diagnosis of A Fib on Eliquis for anticoagulation.     Procedure: CESI C7-T1  Date of procedure: TBD     CHA2DS2-VASc Score = 6  This indicates a 9.7% annual risk of stroke. The patient's score is based upon: CHF History: 1 HTN History: 1 Diabetes History: 0 Stroke History: 0 Vascular Disease History: 1 Age Score: 2 Gender Score: 1       CrCl 42 mL/min Platelet count 347K   Per office protocol, patient can hold Eliquis for 3 days prior to procedure.  Please resume Eliquis as soon as possible postprocedure, at the discretion of the surgeon.   I will route this recommendation to the requesting party via Epic fax function and remove from pre-op pool.  Please call with questions.  Joylene Grapes, NP 06/10/2023, 1:28 PM

## 2023-06-10 NOTE — Telephone Encounter (Signed)
Patient with diagnosis of A Fib on Eliquis for anticoagulation.    Procedure: CESI C7-T1  Date of procedure: TBD   CHA2DS2-VASc Score = 6  This indicates a 9.7% annual risk of stroke. The patient's score is based upon: CHF History: 1 HTN History: 1 Diabetes History: 0 Stroke History: 0 Vascular Disease History: 1 Age Score: 2 Gender Score: 1      CrCl 42 mL/min Platelet count 347K  Per office protocol, patient can hold Eliquis for 3 days prior to procedure.    **This guidance is not considered finalized until pre-operative APP has relayed final recommendations.**

## 2023-06-16 ENCOUNTER — Ambulatory Visit: Payer: Medicare Other | Admitting: Orthopedic Surgery

## 2023-06-16 DIAGNOSIS — Z9889 Other specified postprocedural states: Secondary | ICD-10-CM

## 2023-06-16 NOTE — Progress Notes (Signed)
Orthopedic Surgery Post-operative Office Visit  Procedure: left knee hematoma incision and drainage Date of Surgery: 06/03/2023 (~2 weeks post-op)  Assessment: Patient is a 87 y.o. who is doing well after surgery   Plan: -Operative plans complete -Weight bearing as tolerated -Okay to let soap/water run over incision, but do not submerge -Pain management: tylenol -Return to office in 4 weeks, lumbar x-rays needed at next visit: none  ___________________________________________________________________________   Subjective: Patient has been doing well since surgery. Her knee pain has decreased significantly since surgery. She says she is now having less knee pain than before surgery. She is taking an occasional tylenol to help with pain. Has not noticed any redness or drainage from her incision.   Objective:  General: no acute distress, appropriate affect Neurologic: alert, answering questions appropriately, following commands Respiratory: unlabored breathing on room air Skin: incision is well approximated with no erythema, induration, active/expressible drainage  MSK (RLE): EHL/TA/GSC intact, SILT in s/s/dp/sp/t nerve distributions, foot warm and well perfused   Imaging: None obtained at today's visit   Patient name: Lydia Graham Patient MRN: [id number] Date of visit: 06/16/23

## 2023-06-19 ENCOUNTER — Encounter: Payer: Self-pay | Admitting: Family Medicine

## 2023-06-19 ENCOUNTER — Ambulatory Visit: Payer: Self-pay | Admitting: Family Medicine

## 2023-06-19 ENCOUNTER — Ambulatory Visit: Payer: Medicare Other | Admitting: Family Medicine

## 2023-06-19 VITALS — BP 134/68 | HR 71 | Ht 67.0 in | Wt 145.0 lb

## 2023-06-19 DIAGNOSIS — J069 Acute upper respiratory infection, unspecified: Secondary | ICD-10-CM | POA: Insufficient documentation

## 2023-06-19 MED ORDER — AMOXICILLIN-POT CLAVULANATE 875-125 MG PO TABS: 1.0000 | ORAL_TABLET | Freq: Two times a day (BID) | ORAL | 0 refills | Status: AC

## 2023-06-19 MED ORDER — BENZONATATE 200 MG PO CAPS: 200.0000 mg | ORAL_CAPSULE | Freq: Two times a day (BID) | ORAL | 0 refills | Status: DC | PRN

## 2023-06-19 NOTE — Assessment & Plan Note (Signed)
 Benzonatate 200 mg PRN,  Augmentin 875-125 mg twice daily x 7 days Advise patient to rest to support your body's recovery. Stay hydrated by drinking water, tea, or broth. Using a humidifier can help soothe throat irritation and ease nasal congestion. For fever or pain, acetaminophen (Tylenol) is recommended. To relieve other symptoms, try saline nasal sprays, throat lozenges, or gargling with saltwater. Focus on eating light, healthy meals like fruits and vegetables to keep your strength up. Practice good hygiene by washing your hands frequently and covering your mouth when coughing or sneezing.Follow-up for worsening or persistent symptoms. Patient verbalizes understanding regarding plan of care and all questions answered

## 2023-06-19 NOTE — Progress Notes (Signed)
Established Patient Office Visit   Subjective  Patient ID: Lydia Graham, female    DOB: May 13, 1936  Age: 87 y.o. MRN: [id number]  Chief Complaint  Patient presents with   Facial Pain    Pt reports sx of sinus , has a cough and mucus.     She  has a past medical history of Arthritis, Carotid artery occlusion, Dysrhythmia, GERD (gastroesophageal reflux disease), Headache, Hypertension, and PAF (paroxysmal atrial fibrillation) (HCC).  Patient complains of persistent productive cough. Patient describes symptoms of chest congestion, right ear pain, rhinorrhea, fatigue, malaise, myalgias, sore throat, sputum production, and night sweats. Symptoms began several days ago and are gradually worsening since that time. Patient denies chest pain or nausea and vomiting. Treatment thus far includes anti-tussive: not very effective Past pulmonary history is significant for bronchitis, Pneumonia     Review of Systems  Constitutional:  Positive for fever and malaise/fatigue. Negative for chills.  HENT:  Positive for ear pain, sinus pain and sore throat.   Respiratory:  Positive for cough and sputum production.   Cardiovascular:  Negative for chest pain.  Neurological:  Negative for dizziness and headaches.      Objective:     BP 134/68 (BP Location: Left Arm)   Pulse 71   Ht 5\' 7"  (1.702 m)   Wt 145 lb (65.8 kg)   LMP  (LMP Unknown)   SpO2 92%   BMI 22.71 kg/m  BP Readings from Last 3 Encounters:  06/19/23 134/68  06/03/23 138/61  05/30/23 (!) 148/64      Physical Exam Vitals reviewed.  Constitutional:      General: She is not in acute distress.    Appearance: Normal appearance. She is not ill-appearing, toxic-appearing or diaphoretic.  HENT:     Head: Normocephalic.     Right Ear: Tympanic membrane is erythematous.     Left Ear: Tympanic membrane is not erythematous.  Eyes:     General:        Right eye: No discharge.        Left eye: No discharge.      Conjunctiva/sclera: Conjunctivae normal.  Cardiovascular:     Rate and Rhythm: Normal rate.     Pulses: Normal pulses.     Heart sounds: Normal heart sounds.  Pulmonary:     Effort: Pulmonary effort is normal. No respiratory distress.     Breath sounds: Wheezing present.  Abdominal:     Tenderness: There is no right CVA tenderness.  Musculoskeletal:        General: Normal range of motion.     Cervical back: Normal range of motion.  Skin:    General: Skin is warm and dry.     Capillary Refill: Capillary refill takes less than 2 seconds.  Neurological:     General: No focal deficit present.     Mental Status: She is alert and oriented to person, place, and time.     Coordination: Coordination normal.     Gait: Gait normal.  Psychiatric:        Mood and Affect: Mood normal.        Behavior: Behavior normal.      No results found for any visits on 06/19/23.  The ASCVD Risk score (Arnett DK, et al., 2019) failed to calculate for the following reasons:   The 2019 ASCVD risk score is only valid for ages 56 to 48    Assessment & Plan:  Upper respiratory tract infection, unspecified type  Assessment & Plan: Benzonatate 200 mg PRN,  Augmentin 875-125 mg twice daily x 7 days  Advise patient to rest to support your body's recovery. Stay hydrated by drinking water, tea, or broth. Using a humidifier can help soothe throat irritation and ease nasal congestion. For fever or pain, acetaminophen (Tylenol) is recommended. To relieve other symptoms, try saline nasal sprays, throat lozenges, or gargling with saltwater. Focus on eating light, healthy meals like fruits and vegetables to keep your strength up. Practice good hygiene by washing your hands frequently and covering your mouth when coughing or sneezing.Follow-up for worsening or persistent symptoms. Patient verbalizes understanding regarding plan of care and all questions answered    Orders: -     Benzonatate; Take 1 capsule (200 mg  total) by mouth 2 (two) times daily as needed for cough.  Dispense: 20 capsule; Refill: 0 -     Amoxicillin-Pot Clavulanate; Take 1 tablet by mouth 2 (two) times daily for 7 days.  Dispense: 14 tablet; Refill: 0    Return if symptoms worsen or fail to improve.   Cruzita Lederer Newman Nip, FNP

## 2023-06-19 NOTE — Patient Instructions (Signed)

## 2023-07-02 ENCOUNTER — Encounter (HOSPITAL_COMMUNITY): Payer: Medicare Other

## 2023-07-02 ENCOUNTER — Ambulatory Visit: Payer: Medicare Other

## 2023-07-08 DIAGNOSIS — M5412 Radiculopathy, cervical region: Secondary | ICD-10-CM | POA: Diagnosis not present

## 2023-07-11 ENCOUNTER — Other Ambulatory Visit: Payer: Self-pay

## 2023-07-11 DIAGNOSIS — I6523 Occlusion and stenosis of bilateral carotid arteries: Secondary | ICD-10-CM

## 2023-07-14 ENCOUNTER — Ambulatory Visit (INDEPENDENT_AMBULATORY_CARE_PROVIDER_SITE_OTHER): Payer: Medicare Other | Admitting: Orthopedic Surgery

## 2023-07-14 DIAGNOSIS — T148XXA Other injury of unspecified body region, initial encounter: Secondary | ICD-10-CM

## 2023-07-14 NOTE — Progress Notes (Signed)
Orthopedic Surgery Post-operative Office Visit   Procedure: left knee hematoma incision and drainage Date of Surgery: 06/03/2023 (~6 weeks post-op)   Assessment: Patient is a 88 y.o. who is doing well after surgery     Plan: -Operative plans complete -Weight bearing as tolerated,activity as tolerated -Okay to submerge wound -Pain management: tylenol -Return to office in 6 weeks, lumbar x-rays needed at next visit: none   ___________________________________________________________________________     Subjective: Patient has been doing well.  She is ambulating with a cane.  She is not having any knee pain at this point.  She has not noticed any redness or drainage around the incision.  She has not noticed any protuberance or swelling in the prepatellar area.   Objective:   General: no acute distress, appropriate affect Neurologic: alert, answering questions appropriately, following commands Respiratory: unlabored breathing on room air Skin: incision is well approximated with no erythema, induration, active/expressible drainage   MSK (RLE): Knee ROM from 0-110, EHL/TA/GSC intact, SILT in s/s/dp/sp/t nerve distributions, foot warm and well perfused    Imaging: None obtained at today's visit     Patient name: Lydia Graham Patient MRN: [id number] Date of visit: 07/14/23

## 2023-07-16 ENCOUNTER — Other Ambulatory Visit: Payer: Self-pay | Admitting: Cardiology

## 2023-07-22 ENCOUNTER — Ambulatory Visit: Payer: Medicare Other | Admitting: Family Medicine

## 2023-07-23 ENCOUNTER — Ambulatory Visit (HOSPITAL_COMMUNITY)
Admission: RE | Admit: 2023-07-23 | Discharge: 2023-07-23 | Disposition: A | Discharge status: Home / Self Care | Payer: Medicare Other | Source: Ambulatory Visit | Attending: Vascular Surgery | Admitting: Vascular Surgery

## 2023-07-23 ENCOUNTER — Encounter: Payer: Self-pay | Admitting: Physician Assistant

## 2023-07-23 ENCOUNTER — Ambulatory Visit (INDEPENDENT_AMBULATORY_CARE_PROVIDER_SITE_OTHER): Payer: Medicare Other | Admitting: Physician Assistant

## 2023-07-23 VITALS — BP 159/76 | HR 65 | Temp 98.0°F | Resp 20 | Ht 67.0 in | Wt 146.6 lb

## 2023-07-23 DIAGNOSIS — I6523 Occlusion and stenosis of bilateral carotid arteries: Secondary | ICD-10-CM

## 2023-07-23 NOTE — Progress Notes (Signed)
History of Present Illness:  Patient is a 88 y.o. year old female who presents for evaluation of carotid stenosis.  She is s/p left CEA for asymptomatic high grade stenosis.  The patient denies symptoms of TIA, amaurosis, aphasia, weakness on one side or other stroke like symptoms.  She denies claudication, rest pain or non healing wounds to suspect PAD.    She states she has had 2 episodes of Staph infection one on her right arm and the other after a scratch on he left leg.  She has since recovered.  She also has ne Afib managed with amiodarone and Eliquis currently.     The pt is not on a statin for cholesterol management due to intolerance The pt is on a daily aspirin.   Other AC:  Eliquis The pt is on CCB, ARB, BB for hypertension.   The pt is not diabetic.   Tobacco hx:  never      Past Medical History:  Diagnosis Date   Arthritis    oa, all over - multiple areas    Carotid artery occlusion    Dysrhythmia    AFib    GERD (gastroesophageal reflux disease)    Headache    h/o migraines    Hypertension    PAF (paroxysmal atrial fibrillation) (HCC)    a. multiple DCCV's in 2019 and eventually required initiation of Amiodarone with successful DCCV after this. b. recurrent in 12/2021 while admitted for Urosepsis    Past Surgical History:  Procedure Laterality Date   ABDOMINAL HYSTERECTOMY     APPENDECTOMY     BACK SURGERY     CARDIOVERSION N/A 12/19/2017   Procedure: CARDIOVERSION;  Surgeon: Antoine Poche, MD;  Location: [facility] SUITE;  Service: Endoscopy;  Laterality: N/A;   CARDIOVERSION N/A 12/26/2017   Procedure: CARDIOVERSION;  Surgeon: Chrystie Nose, MD;  Location: [facility] ENDOSCOPY;  Service: Cardiovascular;  Laterality: N/A;   CARDIOVERSION N/A 01/26/2018   Procedure: CARDIOVERSION;  Surgeon: Jake Bathe, MD;  Location: [facility] ENDOSCOPY;  Service: Cardiovascular;  Laterality: N/A;   CAROTID ENDARTERECTOMY     CATARACT EXTRACTION W/PHACO Right 03/09/2021   Procedure:  CATARACT EXTRACTION PHACO AND INTRAOCULAR LENS PLACEMENT with Placement of Corticosteroid (IOC);  Surgeon: Fabio Pierce, MD;  Location: [facility] ORS;  Service: Ophthalmology;  Laterality: Right;  CDE 9.16   CATARACT EXTRACTION W/PHACO Left 04/02/2021   Procedure: CATARACT EXTRACTION PHACO AND INTRAOCULAR LENS PLACEMENT LEFT EYE;  Surgeon: Fabio Pierce, MD;  Location: [facility] ORS;  Service: Ophthalmology;  Laterality: Left;  left CDE=6.75   ENDARTERECTOMY Left 03/27/2017   Procedure: ENDARTERECTOMY CAROTID LEFT;  Surgeon: Maeola Harman, MD;  Location: [facility] OR;  Service: Vascular;  Laterality: Left;   INNER EAR SURGERY Right    puntured eardrum- repaired 2x's    KNEE ARTHROPLASTY     NASAL SINUS SURGERY     deviated septum   PATCH ANGIOPLASTY Left 03/27/2017   Procedure: PATCH ANGIOPLASTY LEFT CAROTID ARTERY USING Livia Snellen BIOLOGIC PATCH;  Surgeon: Maeola Harman, MD;  Location: [facility] OR;  Service: Vascular;  Laterality: Left;   TONSILLECTOMY     TOTAL KNEE ARTHROPLASTY Right 10/23/2016   Procedure: RIGHT TOTAL KNEE ARTHROPLASTY;  Surgeon: Nadara Mustard, MD;  Location: [facility] OR;  Service: Orthopedics;  Laterality: Right;     Social History Social History   Tobacco Use   Smoking status: Never   Smokeless tobacco: Never  Vaping Use   Vaping status: Never Used  Substance  Use Topics   Alcohol use: No   Drug use: No    Family History Family History  Problem Relation Age of Onset   Stroke Mother    Rheum arthritis Sister     Allergies  Allergies  Allergen Reactions   Crestor [Rosuvastatin] Other (See Comments)    Myalgias   Other Other (See Comments)    Hypotension caused by pain medication/anesthesia   Singulair [Montelukast Sodium] Other (See Comments)    Fatigue    Zestril [Lisinopril] Cough     Current Outpatient Medications  Medication Sig Dispense Refill   amiodarone (PACERONE) 200 MG tablet Take 0.5 tablets (100 mg total) by mouth daily. 45 tablet 1    amLODipine (NORVASC) 5 MG tablet TAKE 1 TABLET BY MOUTH AT 8 am AND TAKE 1 TABLET BY MOUTH AT 8 pm 180 tablet 1   apixaban (ELIQUIS) 5 MG TABS tablet Take 1 tablet (5 mg total) by mouth 2 (two) times daily. 60 tablet 5   baclofen (LIORESAL) 10 MG tablet Take 1 tablet (10 mg total) by mouth at bedtime as needed for muscle spasms. 30 each 0   benzonatate (TESSALON) 200 MG capsule Take 1 capsule (200 mg total) by mouth 2 (two) times daily as needed for cough. 20 capsule 0   Calcium Carb-Cholecalciferol (CALCIUM + VITAMIN D3 PO) Take 1 tablet by mouth daily.     Camphor-Menthol-Methyl Sal (SALONPAS) 3.06-29-08 % PTCH Place 1 patch onto the skin at bedtime as needed (foot pain).     Cyanocobalamin (VITAMIN B-12 PO) Take 1 tablet by mouth daily.     estradiol (ESTRACE) 0.5 MG tablet Take 0.5 mg by mouth daily.      Hypromellose (ARTIFICIAL TEARS OP) Place 2 drops into both eyes 2 (two) times daily as needed (for dry eyes).     losartan (COZAAR) 50 MG tablet take 1 tablet at 10 IN THE MORNING and 1 tablet at 10 pm. 60 tablet 9   MAGNESIUM PO Take 1 capsule by mouth at bedtime as needed (cramping).     metoprolol succinate (TOPROL-XL) 25 MG 24 hr tablet Take 1 tablet (25 mg total) by mouth daily. 30 tablet 6   pantoprazole (PROTONIX) 40 MG tablet TAKE (1) TABLET TWICE DAILY. (Patient taking differently: Take 40 mg by mouth daily.) 60 tablet 6   UNABLE TO FIND Med Name: Mediven Comfort calf medical compression stocking 30-40 ICD: I83.893 1 each 1   No current facility-administered medications for this visit.    ROS:   General:  No weight loss, Fever, chills  HEENT: No recent headaches, no nasal bleeding, no visual changes, no sore throat  Neurologic: No dizziness, blackouts, seizures. No recent symptoms of stroke or mini- stroke. No recent episodes of slurred speech, or temporary blindness.  Cardiac: No recent episodes of chest pain/pressure, no shortness of breath at rest.  No shortness of breath  with exertion.  positive history of atrial fibrillation or irregular heartbeat  Vascular: No history of rest pain in feet.  No history of claudication.  No history of non-healing ulcer, No history of DVT   Pulmonary: No home oxygen, no productive cough, no hemoptysis,  No asthma or wheezing  Musculoskeletal:  [x ] Arthritis, [ ]  Low back pain,  [ x] Joint pain  Hematologic:No history of hypercoagulable state.  No history of easy bleeding.  No history of anemia  Gastrointestinal: No hematochezia or melena,  No gastroesophageal reflux, no trouble swallowing  Urinary: [ ]  chronic Kidney  disease, [ ]  on HD - [ ]  MWF or [ ]  TTHS, [ ]  Burning with urination, [ ]  Frequent urination, [ ]  Difficulty urinating;   Skin: No rashes  Psychological: No history of anxiety,  No history of depression   Physical Examination  Vitals:   07/23/23 0839 07/23/23 0840  BP: (!) 153/76 (!) 159/76  Pulse: 65   Resp: 20   Temp: 98 F (36.7 C)   TempSrc: Temporal   SpO2: 95%   Weight: 146 lb 9.6 oz (66.5 kg)   Height: 5\' 7"  (1.702 m)     Body mass index is 22.96 kg/m.  General:  Alert and oriented, no acute distress HEENT: Normal Neck: No bruit or JVD Pulmonary: Clear to auscultation bilaterally Cardiac: Regular Rate and Rhythm without murmur not currently in Afib Gastrointestinal: Soft, non-tender, non-distended, no mass, no scars Skin: No rash Extremity Pulses:  2+ radial pulses bilaterally Musculoskeletal: No deformity or edema  Neurologic: Upper and lower extremity motor 5/5 and symmetric  DATA:  Right Carotid Findings:  +----------+--------+--------+--------+------------------+--------+           PSV cm/sEDV cm/sStenosisPlaque DescriptionComments  +----------+--------+--------+--------+------------------+--------+  CCA Prox  107     10                                          +----------+--------+--------+--------+------------------+--------+  CCA Mid   103     12                                           +----------+--------+--------+--------+------------------+--------+  CCA Distal94      12                                          +----------+--------+--------+--------+------------------+--------+  ICA Prox  96      22      1-39%   heterogenous                +----------+--------+--------+--------+------------------+--------+  ICA Mid   93      18                                          +----------+--------+--------+--------+------------------+--------+  ICA Distal88      16                                          +----------+--------+--------+--------+------------------+--------+  ECA      98      4                                           +----------+--------+--------+--------+------------------+--------+    +---------+--------+--+--------+-+---------+  VertebralPSV cm/s61EDV cm/s9Antegrade  +---------+--------+--+--------+-+---------+      Left Carotid Findings:  +----------+--------+--------+--------+------------------+--------+           PSV cm/sEDV cm/sStenosisPlaque DescriptionComments  +----------+--------+--------+--------+------------------+--------+  CCA Prox  93      8                                           +----------+--------+--------+--------+------------------+--------+  CCA Mid   75      13                                          +----------+--------+--------+--------+------------------+--------+  CCA Distal78      14                                          +----------+--------+--------+--------+------------------+--------+  ICA Prox  67      13      1-39%   heterogenous                +----------+--------+--------+--------+------------------+--------+  ICA Mid   68      16                                          +----------+--------+--------+--------+------------------+--------+  ICA Distal82      15                                           +----------+--------+--------+--------+------------------+--------+  ECA      85      0                                           +----------+--------+--------+--------+------------------+--------+    +---------+--------+--+--------+--+---------+  VertebralPSV cm/s46EDV cm/s10Antegrade  +---------+--------+--+--------+--+---------+    Summary:  Right Carotid: Velocities in the right ICA are consistent with a 1-39%  stenosis.   Left Carotid: Velocities in the left ICA are consistent with a 1-39%  stenosis.   Vertebrals: Bilateral vertebral arteries demonstrate antegrade flow.    ASSESSMENT/PLAN: Carotid stenosis with history of left CEA for asymptomatic ICA stenosis > 80%  She is doing well and remains asymptomatic for stroke/TIA.  She tries to stay active on a daily basis.  She ambulates with a straight cane.  The duplex today shows < 39% B ICA without recurrent left ICA stenosis.   - reviewed signs and symptoms of TIA/ Stroke and she understands should this occur to seek immediate medical attention -She will follow up in 1 year with repeat carotid duplex       Mosetta Pigeon PA-C [facility] Office: [phone]  MD in clinic [place]

## 2023-07-30 ENCOUNTER — Other Ambulatory Visit: Payer: Self-pay | Admitting: Cardiology

## 2023-07-30 ENCOUNTER — Ambulatory Visit (INDEPENDENT_AMBULATORY_CARE_PROVIDER_SITE_OTHER): Payer: Medicare Other

## 2023-07-30 VITALS — Ht 67.0 in | Wt 146.0 lb

## 2023-07-30 DIAGNOSIS — Z0000 Encounter for general adult medical examination without abnormal findings: Secondary | ICD-10-CM | POA: Diagnosis not present

## 2023-07-30 NOTE — Patient Instructions (Addendum)
 Lydia Graham , Thank you for taking time to come for your Medicare Wellness Visit. I appreciate your ongoing commitment to your health goals. Please review the following plan we discussed and let me know if I can assist you in the future.   Referrals/Orders/Follow-Ups/Clinician Recommendations: Aim for 30 minutes of exercise or brisk walking, 6-8 glasses of water , and 5 servings of fruits and vegetables each day.   This is a list of the screening recommended for you and due dates:  Health Maintenance  Topic Date Due   Zoster (Shingles) Vaccine (1 of 2) Never done   DTaP/Tdap/Td vaccine (3 - Td or Tdap) 02/16/2005   COVID-19 Vaccine (3 - Pfizer risk series) 11/06/2019   Pneumonia Vaccine (1 of 2 - PCV) 06/18/2024*   Medicare Annual Wellness Visit  07/29/2024   Flu Shot  Completed   DEXA scan (bone density measurement)  Completed   HPV Vaccine  Aged Out  *Topic was postponed. The date shown is not the original due date.    Advanced directives: (Copy Requested) Please bring a copy of your health care power of attorney and living will to the office to be added to your chart at your convenience.  Next Medicare Annual Wellness Visit scheduled for next year: Yes - 07/30/24

## 2023-07-30 NOTE — Progress Notes (Signed)
 Subjective:   Lydia Graham is a 88 y.o. female who presents for Medicare Annual (Subsequent) preventive examination.  Visit Complete: Virtual I connected with  Lydia Graham on 07/30/23 by a audio enabled telemedicine application and verified that I am speaking with the correct person using two identifiers.  Patient Location: Home  Provider Location: Office/Clinic  I discussed the limitations of evaluation and management by telemedicine. The patient expressed understanding and agreed to proceed.  Vital Signs: Because this visit was a virtual/telehealth visit, some criteria may be missing or patient reported. Any vitals not documented were not able to be obtained and vitals that have been documented are patient reported.  Cardiac Risk Factors include: advanced age (>37men, >54 women);dyslipidemia;hypertension     Objective:    Today's Vitals   07/30/23 1355  Weight: 146 lb (66.2 kg)  Height: 5' 7 (1.702 m)   Body mass index is 22.87 kg/m.     07/30/2023    1:51 PM 05/30/2023    1:07 PM 04/01/2023    5:36 PM 02/08/2023   11:48 AM 08/19/2022   10:27 AM 12/25/2021    5:27 AM 04/02/2021   11:14 AM  Advanced Directives  Does Patient Have a Medical Advance Directive? Yes No Yes Yes No Yes No  Type of Advance Directive Living will;Healthcare Power of Attorney  Living will Healthcare Power of Attorney  Living will   Does patient want to make changes to medical advance directive?      No - Patient declined   Copy of Healthcare Power of Attorney in Chart? No - copy requested        Would patient like information on creating a medical advance directive?  No - Patient declined   No - Patient declined  No - Patient declined    Current Medications (verified) Outpatient Encounter Medications as of 07/30/2023  Medication Sig   amiodarone  (PACERONE ) 200 MG tablet Take 0.5 tablets (100 mg total) by mouth daily.   amLODipine  (NORVASC ) 5 MG tablet TAKE 1 TABLET BY MOUTH AT 8 am AND  TAKE 1 TABLET BY MOUTH AT 8 pm   apixaban  (ELIQUIS ) 5 MG TABS tablet Take 1 tablet (5 mg total) by mouth 2 (two) times daily.   baclofen  (LIORESAL ) 10 MG tablet Take 1 tablet (10 mg total) by mouth at bedtime as needed for muscle spasms.   Calcium  Carb-Cholecalciferol (CALCIUM  + VITAMIN D3 PO) Take 1 tablet by mouth daily.   Camphor-Menthol -Methyl Sal (SALONPAS) 3.06-29-08 % PTCH Place 1 patch onto the skin at bedtime as needed (foot pain).   Cyanocobalamin  (VITAMIN B-12 PO) Take 1 tablet by mouth daily.   estradiol  (ESTRACE ) 0.5 MG tablet Take 0.5 mg by mouth daily.    Hypromellose (ARTIFICIAL TEARS OP) Place 2 drops into both eyes 2 (two) times daily as needed (for dry eyes).   losartan  (COZAAR ) 50 MG tablet take 1 tablet at 10 IN THE MORNING and 1 tablet at 10 pm.   MAGNESIUM  PO Take 1 capsule by mouth at bedtime as needed (cramping).   metoprolol  succinate (TOPROL -XL) 25 MG 24 hr tablet Take 1 tablet (25 mg total) by mouth daily.   pantoprazole  (PROTONIX ) 40 MG tablet TAKE 1 TABLET BY MOUTH TWICE DAILY   UNABLE TO FIND Med Name: Mediven Comfort calf medical compression stocking 30-40 ICD: I83.893   [DISCONTINUED] benzonatate  (TESSALON ) 200 MG capsule Take 1 capsule (200 mg total) by mouth 2 (two) times daily as needed for cough.   No  facility-administered encounter medications on file as of 07/30/2023.    Allergies (verified) Crestor [rosuvastatin], Other, Singulair [montelukast sodium], and Zestril [lisinopril]   History: Past Medical History:  Diagnosis Date   Arthritis    oa, all over - multiple areas    Carotid artery occlusion    Dysrhythmia    AFib    GERD (gastroesophageal reflux disease)    Headache    h/o migraines    Hypertension    PAF (paroxysmal atrial fibrillation) (HCC)    a. multiple DCCV's in 2019 and eventually required initiation of Amiodarone  with successful DCCV after this. b. recurrent in 12/2021 while admitted for Urosepsis   Past Surgical History:   Procedure Laterality Date   ABDOMINAL HYSTERECTOMY     APPENDECTOMY     BACK SURGERY     CARDIOVERSION N/A 12/19/2017   Procedure: CARDIOVERSION;  Surgeon: Alvan Dorn FALCON, MD;  Location: [facility] END[facility] SUITE;  Service: Endoscopy;  Laterality: N/A;   CARDIOVERSION N/A 12/26/2017   Procedure: CARDIOVERSION;  Surgeon: Mona Vinie BROCKS, MD;  Location: [facility] ENDOSCOPY;  Service: Cardiovascular;  Laterality: N/A;   CARDIOVERSION N/A 01/26/2018   Procedure: CARDIOVERSION;  Surgeon: Jeffrie Oneil BROCKS, MD;  Location: [facility] ENDOSCOPY;  Service: Cardiovascular;  Laterality: N/A;   CAROTID ENDARTERECTOMY     CATARACT EXTRACTION W/PHACO Right 03/09/2021   Procedure: CATARACT EXTRACTION PHACO AND INTRAOCULAR LENS PLACEMENT with Placement of Corticosteroid (IOC);  Surgeon: Harrie Agent, MD;  Location: [facility] ORS;  Service: Ophthalmology;  Laterality: Right;  CDE 9.16   CATARACT EXTRACTION W/PHACO Left 04/02/2021   Procedure: CATARACT EXTRACTION PHACO AND INTRAOCULAR LENS PLACEMENT LEFT EYE;  Surgeon: Harrie Agent, MD;  Location: [facility] ORS;  Service: Ophthalmology;  Laterality: Left;  left CDE=6.75   ENDARTERECTOMY Left 03/27/2017   Procedure: ENDARTERECTOMY CAROTID LEFT;  Surgeon: Sheree Penne Bruckner, MD;  Location: [facility] OR;  Service: Vascular;  Laterality: Left;   INNER EAR SURGERY Right    puntured eardrum- repaired 2x's    KNEE ARTHROPLASTY     NASAL SINUS SURGERY     deviated septum   PATCH ANGIOPLASTY Left 03/27/2017   Procedure: PATCH ANGIOPLASTY LEFT CAROTID ARTERY USING GEORGE BIOLOGIC PATCH;  Surgeon: Sheree Penne Bruckner, MD;  Location: [facility] OR;  Service: Vascular;  Laterality: Left;   TONSILLECTOMY     TOTAL KNEE ARTHROPLASTY Right 10/23/2016   Procedure: RIGHT TOTAL KNEE ARTHROPLASTY;  Surgeon: Harden Jerona GAILS, MD;  Location: [facility] OR;  Service: Orthopedics;  Laterality: Right;   Family History  Problem Relation Age of Onset   Stroke Mother    Rheum arthritis Sister    Social History   Socioeconomic  History   Marital status: Widowed    Spouse name: Not on file   Number of children: Not on file   Years of education: Not on file   Highest education level: Not on file  Occupational History   Not on file  Tobacco Use   Smoking status: Never   Smokeless tobacco: Never  Vaping Use   Vaping status: Never Used  Substance and Sexual Activity   Alcohol  use: No   Drug use: No   Sexual activity: Not Currently    Birth control/protection: Surgical  Other Topics Concern   Not on file  Social History Narrative   Not on file   Social Drivers of Health   Financial Resource Strain: Low Risk  (06/28/2022)   Overall Financial Resource Strain (CARDIA)    Difficulty of Paying Living Expenses: Not hard at  all  Food Insecurity: No Food Insecurity (07/30/2023)   Hunger Vital Sign    Worried About Running Out of Food in the Last Year: Never true    Ran Out of Food in the Last Year: Never true  Transportation Needs: No Transportation Needs (07/30/2023)   PRAPARE - Administrator, Civil Service (Medical): No    Lack of Transportation (Non-Medical): No  Physical Activity: Sufficiently Active (06/28/2022)   Exercise Vital Sign    Days of Exercise per Week: 3 days    Minutes of Exercise per Session: 50 min  Stress: No Stress Concern Present (06/28/2022)   [employer] of Occupational Health - Occupational Stress Questionnaire    Feeling of Stress : Not at all  Social Connections: Moderately Integrated (06/28/2022)   Social Connection and Isolation Panel [NHANES]    Frequency of Communication with Friends and Family: More than three times a week    Frequency of Social Gatherings with Friends and Family: More than three times a week    Attends Religious Services: More than 4 times per year    Active Member of [employer] or Organizations: Yes    Attends Banker Meetings: More than 4 times per year    Marital Status: Widowed    Tobacco Counseling Counseling given: Not  Answered   Clinical Intake:  Pre-visit preparation completed: Yes  Pain : No/denies pain     BMI - recorded: 22.87 Nutritional Status: BMI of 19-24  Normal Diabetes: No  How often do you need to have someone help you when you read instructions, pamphlets, or other written materials from your doctor or pharmacy?: 1 - Never  Interpreter Needed?: No  Information entered by :: Lydia Graham, CMA   Activities of Daily Living    07/30/2023    1:58 PM 05/30/2023    1:09 PM  In your present state of health, do you have any difficulty performing the following activities:  Hearing? 0   Vision? 0   Difficulty concentrating or making decisions? 0   Walking or climbing stairs? 0   Dressing or bathing? 0   Doing errands, shopping? 0 0  Preparing Food and eating ? N   Using the Toilet? N   In the past six months, have you accidently leaked urine? N   Do you have problems with loss of bowel control? N   Managing your Medications? N   Managing your Finances? N   Housekeeping or managing your Housekeeping? N     Patient Care Team: Zarwolo, Gloria, FNP as PCP - General (Family Medicine) Alvan Dorn FALCON, MD as PCP - Cardiology (Cardiology) Mealor, Eulas BRAVO, MD as PCP - Electrophysiology (Cardiology) Floy Lynwood PARAS, MD as Consulting Physician (Otolaryngology)  Indicate any recent Medical Services you may have received from other than [facility] providers in the past year (date may be approximate).     Assessment:   This is a routine wellness examination for Lydia Graham .  Hearing/Vision screen Hearing Screening - Comments:: Denies hearing difficulties   Vision Screening - Comments:: Wears rx glasses - up to date with routine eye exams with  Dr Octavia of [facility]   Goals Addressed               This Visit's Progress     Patient Stated (pt-stated)        Patient stated plans to exercise more often.       Depression Screen    07/30/2023  2:04 PM 06/19/2023   10:38  AM 05/01/2023    1:18 PM 03/19/2023   10:59 AM 02/14/2023    3:01 PM 09/03/2022    2:30 PM 06/28/2022   11:27 AM  PHQ 2/9 Scores  PHQ - 2 Score 0 0 0 0 0 0 0  PHQ- 9 Score 0 0    0 0    Fall Risk    07/30/2023    2:00 PM 06/19/2023   10:48 AM 06/19/2023   10:38 AM 05/01/2023    1:18 PM 03/19/2023   10:59 AM  Fall Risk   Falls in the past year? 0 0 0 1 0  Number falls in past yr: 0 0 0 0 0  Injury with Fall? 0 0 0 1 0  Risk for fall due to : No Fall Risks No Fall Risks No Fall Risks Impaired balance/gait;Impaired mobility No Fall Risks  Follow up Falls evaluation completed;Falls prevention discussed Falls evaluation completed Falls evaluation completed Follow up appointment Falls evaluation completed    MEDICARE RISK AT HOME: Medicare Risk at Home Any stairs in or around the home?: Yes If so, are there any without handrails?: No Home free of loose throw rugs in walkways, pet beds, electrical cords, etc?: Yes Adequate lighting in your home to reduce risk of falls?: Yes Life alert?: No Use of a cane, walker or w/c?: Yes (cane) Grab bars in the bathroom?: Yes Shower chair or bench in shower?: No Elevated toilet seat or a handicapped toilet?: Yes  TIMED UP AND GO:  Was the test performed?  No    Cognitive Function:        07/30/2023    2:01 PM 06/28/2022   11:27 AM  6CIT Screen  What Year? 0 points 0 points  What month? 0 points 0 points  What time? 0 points 0 points  Count back from 20 0 points 0 points  Months in reverse 0 points 0 points  Repeat phrase 2 points 0 points  Total Score 2 points 0 points    Immunizations Immunization History  Administered Date(s) Administered   Influenza, Quadrivalent, Recombinant, Inj, Pf 04/29/2022   Influenza,inj,Quad PF,6+ Mos 03/12/2019   Influenza-Unspecified 02/23/2016, 03/30/2019, 03/31/2023   PFIZER(Purple Top)SARS-COV-2 Vaccination 09/16/2019, 10/09/2019   Td (Adult),5 Lf Tetanus Toxid, Preservative Free 02/17/1995   Tdap  02/17/1995    TDAP status: Due, Education has been provided regarding the importance of this vaccine. Advised may receive this vaccine at local pharmacy or Health Dept. Aware to provide a copy of the vaccination record if obtained from local pharmacy or Health Dept. Verbalized acceptance and understanding.  Flu Vaccine status: Up to date - 03/31/23  Pneumococcal vaccine status: Up to date - 06/19/23  Covid-19 vaccine status: Information provided on how to obtain vaccines.   Qualifies for Shingles Vaccine? Yes   Zostavax completed No   Shingrix Completed?: No.    Education has been provided regarding the importance of this vaccine. Patient has been advised to call insurance company to determine out of pocket expense if they have not yet received this vaccine. Advised may also receive vaccine at local pharmacy or Health Dept. Verbalized acceptance and understanding.  Screening Tests Health Maintenance  Topic Date Due   Zoster Vaccines- Shingrix (1 of 2) Never done   DTaP/Tdap/Td (3 - Td or Tdap) 02/16/2005   COVID-19 Vaccine (3 - Pfizer risk series) 11/06/2019   Pneumonia Vaccine 39+ Years old (1 of 2 - PCV) 06/18/2024 (Originally 12/17/1941)  Medicare Annual Wellness (AWV)  07/29/2024   INFLUENZA VACCINE  Completed   DEXA SCAN  Completed   HPV VACCINES  Aged Out    Health Maintenance  Health Maintenance Due  Topic Date Due   Zoster Vaccines- Shingrix (1 of 2) Never done   DTaP/Tdap/Td (3 - Td or Tdap) 02/16/2005   COVID-19 Vaccine (3 - Pfizer risk series) 11/06/2019    Colorectal cancer screening: No longer required.   Mammogram status: No longer required due to age.  Bone Density status: Completed 01/28/2013. Results reflect: Bone density results: OSTEOPOROSIS.  Pt declines repeat DEXA.   Additional Screening:  Hepatitis C Screening: does not qualify  Vision Screening: Recommended annual ophthalmology exams for early detection of glaucoma and other disorders of the  eye. Is the patient up to date with their annual eye exam?  Yes  Who is the provider or what is the name of the office in which the patient attends annual eye exams? [facility] If pt is not established with a provider, would they like to be referred to a provider to establish care? No .   Dental Screening: Recommended annual dental exams for proper oral hygiene  Community Resource Referral / Chronic Care Management: CRR required this visit?  No   CCM required this visit?  No     Plan:     I have personally reviewed and noted the following in the patient's chart:   Medical and social history Use of alcohol , tobacco or illicit drugs  Current medications and supplements including opioid prescriptions. Patient is not currently taking opioid prescriptions. Functional ability and status Nutritional status Physical activity Advanced directives List of other physicians Hospitalizations, surgeries, and ER visits in previous 12 months Vitals Screenings to include cognitive, depression, and falls Referrals and appointments  In addition, I have reviewed and discussed with patient certain preventive protocols, quality metrics, and best practice recommendations. A written personalized care plan for preventive services as well as general preventive health recommendations were provided to patient.     Lydia Graham, CMA   07/30/2023   After Visit Summary: (MyChart) Due to this being a telephonic visit, the after visit summary with patients personalized plan was offered to patient via MyChart   Nurse Notes: none

## 2023-07-31 ENCOUNTER — Ambulatory Visit (INDEPENDENT_AMBULATORY_CARE_PROVIDER_SITE_OTHER): Payer: Medicare Other | Admitting: Family Medicine

## 2023-07-31 ENCOUNTER — Encounter: Payer: Self-pay | Admitting: Family Medicine

## 2023-07-31 VITALS — BP 142/70 | HR 70 | Resp 16 | Ht 67.0 in | Wt 144.0 lb

## 2023-07-31 DIAGNOSIS — I10 Essential (primary) hypertension: Secondary | ICD-10-CM

## 2023-07-31 DIAGNOSIS — E038 Other specified hypothyroidism: Secondary | ICD-10-CM

## 2023-07-31 DIAGNOSIS — E559 Vitamin D deficiency, unspecified: Secondary | ICD-10-CM

## 2023-07-31 DIAGNOSIS — E7849 Other hyperlipidemia: Secondary | ICD-10-CM | POA: Diagnosis not present

## 2023-07-31 DIAGNOSIS — M25511 Pain in right shoulder: Secondary | ICD-10-CM

## 2023-07-31 DIAGNOSIS — G8929 Other chronic pain: Secondary | ICD-10-CM

## 2023-07-31 DIAGNOSIS — R7301 Impaired fasting glucose: Secondary | ICD-10-CM

## 2023-07-31 NOTE — Assessment & Plan Note (Signed)
 The patient is currently taking Zetia  10 mg daily and is not on a statin due to myalgias.The patient was encouraged to make lifestyle modifications, including avoiding simple carbohydrates, such as cakes, sweet desserts, ice cream, soda (diet or regular), sweet tea, candies, chips, cookies, store-bought juices, excessive alcohol  (more than 1-2 drinks per day), lemonade, artificial sweeteners, donuts, coffee creamers, and sugar-free products. Additionally, reducing the consumption of greasy, fatty foods and increasing physical activity were recommended.The patient verbalized understanding and is aware of the plan of care.

## 2023-07-31 NOTE — Progress Notes (Signed)
 Established Patient Office Visit  Subjective:  Patient ID: Lydia  VEAR Graham, female    DOB: December 08, 1935  Age: 88 y.o. MRN: [id number]  CC:  Chief Complaint  Patient presents with   Hypertension    4 month follow up   Shoulder Pain    Has been hurting in her right shoulder and was sent to Dr Darlis for pain management but its not doing much good     HPI Lydia Graham is a 88 y.o. female with past medical history of hypertension, chronic right shoulder pains, and Atrial Fibrillation presents for f/u of chronic medical conditions. For the details of today's visit, please refer to the assessment and plan.     Past Medical History:  Diagnosis Date   Arthritis    oa, all over - multiple areas    Carotid artery occlusion    Dysrhythmia    AFib    GERD (gastroesophageal reflux disease)    Headache    h/o migraines    Hypertension    PAF (paroxysmal atrial fibrillation) (HCC)    a. multiple DCCV's in 2019 and eventually required initiation of Amiodarone  with successful DCCV after this. b. recurrent in 12/2021 while admitted for Urosepsis    Past Surgical History:  Procedure Laterality Date   ABDOMINAL HYSTERECTOMY     APPENDECTOMY     BACK SURGERY     CARDIOVERSION N/A 12/19/2017   Procedure: CARDIOVERSION;  Surgeon: Alvan Dorn FALCON, MD;  Location: [facility];  Service: Endoscopy;  Laterality: N/A;   CARDIOVERSION N/A 12/26/2017   Procedure: CARDIOVERSION;  Surgeon: Mona Vinie BROCKS, MD;  Location: [facility] ENDOSCOPY;  Service: Cardiovascular;  Laterality: N/A;   CARDIOVERSION N/A 01/26/2018   Procedure: CARDIOVERSION;  Surgeon: Jeffrie Oneil BROCKS, MD;  Location: [facility] ENDOSCOPY;  Service: Cardiovascular;  Laterality: N/A;   CAROTID ENDARTERECTOMY     CATARACT EXTRACTION W/PHACO Right 03/09/2021   Procedure: CATARACT EXTRACTION PHACO AND INTRAOCULAR LENS PLACEMENT with Placement of Corticosteroid (IOC);  Surgeon: Harrie Agent, MD;  Location: [facility] ORS;  Service: Ophthalmology;   Laterality: Right;  CDE 9.16   CATARACT EXTRACTION W/PHACO Left 04/02/2021   Procedure: CATARACT EXTRACTION PHACO AND INTRAOCULAR LENS PLACEMENT LEFT EYE;  Surgeon: Harrie Agent, MD;  Location: [facility] ORS;  Service: Ophthalmology;  Laterality: Left;  left CDE=6.75   ENDARTERECTOMY Left 03/27/2017   Procedure: ENDARTERECTOMY CAROTID LEFT;  Surgeon: Sheree Penne Bruckner, MD;  Location: [facility] OR;  Service: Vascular;  Laterality: Left;   INNER EAR SURGERY Right    puntured eardrum- repaired 2x's    KNEE ARTHROPLASTY     NASAL SINUS SURGERY     deviated septum   PATCH ANGIOPLASTY Left 03/27/2017   Procedure: PATCH ANGIOPLASTY LEFT CAROTID ARTERY USING GEORGE BIOLOGIC PATCH;  Surgeon: Sheree Penne Bruckner, MD;  Location: [facility] OR;  Service: Vascular;  Laterality: Left;   TONSILLECTOMY     TOTAL KNEE ARTHROPLASTY Right 10/23/2016   Procedure: RIGHT TOTAL KNEE ARTHROPLASTY;  Surgeon: Harden Jerona GAILS, MD;  Location: [facility] OR;  Service: Orthopedics;  Laterality: Right;    Family History  Problem Relation Age of Onset   Stroke Mother    Rheum arthritis Sister     Social History   Socioeconomic History   Marital status: Widowed    Spouse name: Not on file   Number of children: Not on file   Years of education: Not on file   Highest education level: Not on file  Occupational History   Not on file  Tobacco Use   Smoking status: Never   Smokeless tobacco: Never  Vaping Use   Vaping status: Never Used  Substance and Sexual Activity   Alcohol  use: No   Drug use: No   Sexual activity: Not Currently    Birth control/protection: Surgical  Other Topics Concern   Not on file  Social History Narrative   Not on file   Social Drivers of Health   Financial Resource Strain: Low Risk  (06/28/2022)   Overall Financial Resource Strain (CARDIA)    Difficulty of Paying Living Expenses: Not hard at all  Food Insecurity: No Food Insecurity (07/30/2023)   Hunger Vital Sign    Worried About Running Out  of Food in the Last Year: Never true    Ran Out of Food in the Last Year: Never true  Transportation Needs: No Transportation Needs (07/30/2023)   PRAPARE - Administrator, Civil Service (Medical): No    Lack of Transportation (Non-Medical): No  Physical Activity: Sufficiently Active (06/28/2022)   Exercise Vital Sign    Days of Exercise per Week: 3 days    Minutes of Exercise per Session: 50 min  Stress: No Stress Concern Present (06/28/2022)   [employer] of Occupational Health - Occupational Stress Questionnaire    Feeling of Stress : Not at all  Social Connections: Moderately Integrated (06/28/2022)   Social Connection and Isolation Panel [NHANES]    Frequency of Communication with Friends and Family: More than three times a week    Frequency of Social Gatherings with Friends and Family: More than three times a week    Attends Religious Services: More than 4 times per year    Active Member of [employer] or Organizations: Yes    Attends Banker Meetings: More than 4 times per year    Marital Status: Widowed  Intimate Partner Violence: Not At Risk (07/30/2023)   Humiliation, Afraid, Rape, and Kick questionnaire    Fear of Current or Ex-Partner: No    Emotionally Abused: No    Physically Abused: No    Sexually Abused: No    Outpatient Medications Prior to Visit  Medication Sig Dispense Refill   amiodarone  (PACERONE ) 200 MG tablet Take 0.5 tablets (100 mg total) by mouth daily. 45 tablet 1   amLODipine  (NORVASC ) 5 MG tablet TAKE 1 TABLET BY MOUTH AT 8 am AND TAKE 1 TABLET BY MOUTH AT 8 pm 180 tablet 1   apixaban  (ELIQUIS ) 5 MG TABS tablet Take 1 tablet (5 mg total) by mouth 2 (two) times daily. 60 tablet 5   baclofen  (LIORESAL ) 10 MG tablet Take 1 tablet (10 mg total) by mouth at bedtime as needed for muscle spasms. 30 each 0   Calcium  Carb-Cholecalciferol (CALCIUM  + VITAMIN D3 PO) Take 1 tablet by mouth daily.     Camphor-Menthol -Methyl Sal (SALONPAS) 3.06-29-08 %  PTCH Place 1 patch onto the skin at bedtime as needed (foot pain).     Cyanocobalamin  (VITAMIN B-12 PO) Take 1 tablet by mouth daily.     estradiol  (ESTRACE ) 0.5 MG tablet Take 0.5 mg by mouth daily.      Hypromellose (ARTIFICIAL TEARS OP) Place 2 drops into both eyes 2 (two) times daily as needed (for dry eyes).     losartan  (COZAAR ) 50 MG tablet take 1 tablet at 10 IN THE MORNING and 1 tablet at 10 pm. 60 tablet 9   MAGNESIUM  PO Take 1 capsule by mouth at bedtime as needed (cramping).  metoprolol  succinate (TOPROL -XL) 25 MG 24 hr tablet Take 1 tablet (25 mg total) by mouth daily. 30 tablet 6   pantoprazole  (PROTONIX ) 40 MG tablet TAKE 1 TABLET BY MOUTH TWICE DAILY 60 tablet 1   UNABLE TO FIND Med Name: Mediven Comfort calf medical compression stocking 30-40 ICD: I83.893 1 each 1   No facility-administered medications prior to visit.    Allergies  Allergen Reactions   Crestor [Rosuvastatin] Other (See Comments)    Myalgias   Other Other (See Comments)    Hypotension caused by pain medication/anesthesia   Singulair [Montelukast Sodium] Other (See Comments)    Fatigue    Zestril [Lisinopril] Cough    ROS Review of Systems  Constitutional:  Negative for chills and fever.  Eyes:  Negative for visual disturbance.  Respiratory:  Negative for chest tightness and shortness of breath.   Neurological:  Negative for dizziness and headaches.      Objective:    Physical Exam HENT:     Head: Normocephalic.     Mouth/Throat:     Mouth: Mucous membranes are moist.  Cardiovascular:     Rate and Rhythm: Normal rate.     Heart sounds: Normal heart sounds.  Pulmonary:     Effort: Pulmonary effort is normal.     Breath sounds: Normal breath sounds.  Neurological:     Mental Status: She is alert.     BP (!) 142/70   Pulse 70   Resp 16   Ht 5' 7 (1.702 m)   Wt 144 lb (65.3 kg)   LMP  (LMP Unknown)   SpO2 93%   BMI 22.55 kg/m  Wt Readings from Last 3 Encounters:  07/31/23  144 lb (65.3 kg)  07/30/23 146 lb (66.2 kg)  07/23/23 146 lb 9.6 oz (66.5 kg)    Lab Results  Component Value Date   TSH 2.728 03/05/2023   Lab Results  Component Value Date   WBC 9.0 05/30/2023   HGB 13.2 05/30/2023   HCT 40.2 05/30/2023   MCV 88.5 05/30/2023   PLT 347 05/30/2023   Lab Results  Component Value Date   NA 137 05/30/2023   K 4.2 05/30/2023   CO2 23 05/30/2023   GLUCOSE 134 (H) 05/30/2023   BUN 17 05/30/2023   CREATININE 1.05 (H) 05/30/2023   BILITOT 0.6 02/10/2023   ALKPHOS 63 02/10/2023   AST 16 02/10/2023   ALT 11 02/10/2023   PROT 6.4 (L) 02/10/2023   ALBUMIN  3.0 (L) 02/10/2023   CALCIUM  9.2 05/30/2023   ANIONGAP 9 05/30/2023   EGFR 43 (L) 09/06/2022   Lab Results  Component Value Date   CHOL 185 09/06/2022   Lab Results  Component Value Date   HDL 49 09/06/2022   Lab Results  Component Value Date   LDLCALC 114 (H) 09/06/2022   Lab Results  Component Value Date   TRIG 120 09/06/2022   Lab Results  Component Value Date   CHOLHDL 3.8 09/06/2022   Lab Results  Component Value Date   HGBA1C 5.6 09/06/2022      Assessment & Plan:  Essential hypertension Assessment & Plan: The patient's blood pressure is uncontrolled in the clinic today. She is asymptomatic and reports treatment compliance with metoprolol  25 mg, amlodipine  5 mg, and losartan  50 mg. The patient prefers to defer any changes to her antihypertensive regimen to her cardiologist. However, she reports feeling very lightheaded and unable to function when her systolic blood pressure is in the 120s,  stating that her baseline is typically in the 140s.She was encouraged to follow up with her cardiologist as scheduled to discuss potential medication adjustments as needed. A low-sodium diet and increased physical activity were also recommended.    Other hyperlipidemia Assessment & Plan: The patient is currently taking Zetia  10 mg daily and is not on a statin due to myalgias.The  patient was encouraged to make lifestyle modifications, including avoiding simple carbohydrates, such as cakes, sweet desserts, ice cream, soda (diet or regular), sweet tea, candies, chips, cookies, store-bought juices, excessive alcohol  (more than 1-2 drinks per day), lemonade, artificial sweeteners, donuts, coffee creamers, and sugar-free products. Additionally, reducing the consumption of greasy, fatty foods and increasing physical activity were recommended.The patient verbalized understanding and is aware of the plan of care.      Orders: -     Lipid panel -     CMP14+EGFR -     CBC with Differential/Platelet  Chronic right shoulder pain Assessment & Plan: The patient is under the care of orthopedic surgery and has been referred to a spine specialist for her right shoulder pain. She reports receiving steroid injections in the right shoulder approximately 3 to 4 weeks ago; however, she states that the medication provided relief for only 3 to 4 hours, after which the pain returned noticeably.Today, she rates her shoulder pain as 5/10, with no red flag symptoms reported. We discussed nonpharmacological interventions, including the application of heat and cold therapy, rest, avoidance of aggravating activities (such as heavy lifting), and performing stretching and strengthening exercises for the shoulder.The patient has attended physical therapy for her right shoulder pain but reports minimal relief from therapy. She is encouraged to follow up with the orthopedic team to inform them that the steroid injections provided minimal relief and to discuss other treatment options. The patient verbalized understanding of the plan.    IFG (impaired fasting glucose) -     Hemoglobin A1c  Vitamin D  deficiency -     VITAMIN D  25 Hydroxy (Vit-D Deficiency, Fractures)  TSH (thyroid -stimulating hormone deficiency) -     TSH + free T4  Note: This chart has been completed using Engineer, Civil (consulting)  software, and while attempts have been made to ensure accuracy, certain words and phrases may not be transcribed as intended.    Follow-up: Return in about 4 months (around 11/28/2023).   Lydia Morr, FNP

## 2023-07-31 NOTE — Patient Instructions (Addendum)
 I appreciate the opportunity to provide care to you today!    Follow up:  4 months  Labs: please stop by the lab during the week to get your blood drawn (CBC, CMP, TSH, Lipid profile, HgA1c, Vit D)   Please continue following up with orthopedic surgery for your right shoulder pain and inform the orthopedic team that the injections are not relieving your symptoms.  Attached with your AVS, you will find valuable resources for self-education. I highly recommend dedicating some time to thoroughly examine them.   Please continue to a heart-healthy diet and increase your physical activities. Try to exercise for at least five days a week.    It was a pleasure to see you and I look forward to continuing to work together on your health and well-being. Please do not hesitate to call the office if you need care or have questions about your care.  In case of emergency, please visit the Emergency Department for urgent care, or contact our clinic at [phone] to schedule an appointment. We're here to help you!   Have a wonderful day and week. With Gratitude, Kamarie Palma MSN, FNP-BC

## 2023-07-31 NOTE — Assessment & Plan Note (Signed)
 The patient is under the care of orthopedic surgery and has been referred to a spine specialist for her right shoulder pain. She reports receiving steroid injections in the right shoulder approximately 3 to 4 weeks ago; however, she states that the medication provided relief for only 3 to 4 hours, after which the pain returned noticeably.Today, she rates her shoulder pain as 5/10, with no red flag symptoms reported. We discussed nonpharmacological interventions, including the application of heat and cold therapy, rest, avoidance of aggravating activities (such as heavy lifting), and performing stretching and strengthening exercises for the shoulder.The patient has attended physical therapy for her right shoulder pain but reports minimal relief from therapy. She is encouraged to follow up with the orthopedic team to inform them that the steroid injections provided minimal relief and to discuss other treatment options. The patient verbalized understanding of the plan.

## 2023-07-31 NOTE — Assessment & Plan Note (Signed)
 The patient's blood pressure is uncontrolled in the clinic today. She is asymptomatic and reports treatment compliance with metoprolol  25 mg, amlodipine  5 mg, and losartan  50 mg. The patient prefers to defer any changes to her antihypertensive regimen to her cardiologist. However, she reports feeling very lightheaded and unable to function when her systolic blood pressure is in the 120s, stating that her baseline is typically in the 140s.She was encouraged to follow up with her cardiologist as scheduled to discuss potential medication adjustments as needed. A low-sodium diet and increased physical activity were also recommended.

## 2023-08-07 ENCOUNTER — Other Ambulatory Visit: Payer: Self-pay

## 2023-08-07 DIAGNOSIS — I6523 Occlusion and stenosis of bilateral carotid arteries: Secondary | ICD-10-CM

## 2023-08-08 ENCOUNTER — Encounter: Payer: Self-pay | Admitting: Cardiovascular Disease

## 2023-08-08 ENCOUNTER — Ambulatory Visit: Payer: Medicare Other | Attending: Cardiovascular Disease | Admitting: Cardiovascular Disease

## 2023-08-08 VITALS — BP 138/78 | HR 72 | Ht 67.0 in | Wt 145.6 lb

## 2023-08-08 DIAGNOSIS — I4891 Unspecified atrial fibrillation: Secondary | ICD-10-CM | POA: Insufficient documentation

## 2023-08-08 NOTE — Patient Instructions (Signed)
Medication Instructions:  Continue all current medications.   Labwork: none  Testing/Procedures: none  Follow-Up: 6 months   Any Other Special Instructions Will Be Listed Below (If Applicable).   If you need a refill on your cardiac medications before your next appointment, please call your pharmacy.

## 2023-08-08 NOTE — Progress Notes (Signed)
   PCP: [name], FNP   Primary EP: Dr Nelly Laurence  Lydia Graham is a 88 y.o. female who presents today for routine electrophysiology followup.    She was admitted to [facility] in August 2024 due to knee pain and a large hematoma after a fall.  During her hospitalization she was noted to have atrial fibrillation and had a 4-second pause.  This was not a postconversion pause.  Strips are available under the CV strips tab.  Beta-blocker was decreased.  Her amiodarone 100 mg daily was continued.   Today, she denies symptoms of palpitations, chest pain, shortness of breath,  lower extremity edema, dizziness, presyncope, or syncope.  The patient is otherwise without complaint today.   She has noticed for some tie now that she feels a little woozy or lightheaded in the morning when she first wakes up, particularly if she did not eat much the night before.  This sensation lasts until she has some food.  She has checked her blood pressure and her pulse rate during these times, and her BP typically is 110s to 120s over 70s, and her heart rate in the 60s.  She knows to avoid getting in the shower when she feels this way because hot water can exacerbate her symptoms.     Physical Exam: Vitals:   08/08/23 1339  BP: 138/78  Pulse: 72  SpO2: 97%  Weight: 145 lb 9.6 oz (66 kg)  Height: 5\' 7"  (1.702 m)     GEN- The patient is well appearing, alert and oriented x 3 today.   Head- normocephalic, atraumatic Eyes-  Sclera clear, conjunctiva pink Ears- hearing intact Oropharynx- clear Lungs- Clear to ausculation bilaterally, normal work of breathing Heart- Regular rate and rhythm, no murmurs, rubs or gallops, PMI not laterally displaced GI- soft, NT, ND, + BS Extremities- no clubbing, cyanosis, or edema  Wt Readings from Last 3 Encounters:  08/08/23 145 lb 9.6 oz (66 kg)  07/31/23 144 lb (65.3 kg)  07/30/23 146 lb (66.2 kg)    EKG tracing ordered today is personally reviewed and shows  sinus with LBBB  Assessment and Plan:  Persistent atrial fibrillation Doing well with amiodarone 100mg  QOD  She is on eliquis for stroke prevention (chads2vasc score is 5) TSH/CMP performed in the past 6 months are ok We discussed signs and symptoms that would require her to go to the ER or call us for expedited support.  2. HTN Stable No change required today Labs 07/28/20 reviewed  3. Chronic diastolic dysfunction Stable No change required today  4. S/p prior CEA for L ICA stenosis in 2018 Stable No change required today Follows with Vascular surgery  5. Moderate MR Follows with Dr Wyline Mood His notes reviewed MR mild by echo 03/28/22 (reviewed)  Risks, benefits and potential toxicities for medications prescribed and/or refilled reviewed with patient today.   Return in a year to see me Follow-up with Dr Wyline Mood in the interim  Maurice Small, MD 08/08/2023 2:35 PM

## 2023-08-11 DIAGNOSIS — E559 Vitamin D deficiency, unspecified: Secondary | ICD-10-CM | POA: Diagnosis not present

## 2023-08-11 DIAGNOSIS — R7301 Impaired fasting glucose: Secondary | ICD-10-CM | POA: Diagnosis not present

## 2023-08-11 DIAGNOSIS — E038 Other specified hypothyroidism: Secondary | ICD-10-CM | POA: Diagnosis not present

## 2023-08-11 DIAGNOSIS — E7849 Other hyperlipidemia: Secondary | ICD-10-CM | POA: Diagnosis not present

## 2023-08-12 LAB — CMP14+EGFR
ALT: 9 [IU]/L (ref 0–32)
AST: 21 [IU]/L (ref 0–40)
Albumin: 4.5 g/dL (ref 3.7–4.7)
Alkaline Phosphatase: 91 [IU]/L (ref 44–121)
BUN/Creatinine Ratio: 16 (ref 12–28)
BUN: 16 mg/dL (ref 8–27)
Bilirubin Total: 0.5 mg/dL (ref 0.0–1.2)
CO2: 19 mmol/L — ABNORMAL LOW (ref 20–29)
Calcium: 9.8 mg/dL (ref 8.7–10.3)
Chloride: 101 mmol/L (ref 96–106)
Creatinine, Ser: 1.01 mg/dL — ABNORMAL HIGH (ref 0.57–1.00)
Globulin, Total: 2.5 g/dL (ref 1.5–4.5)
Glucose: 87 mg/dL (ref 70–99)
Potassium: 4.5 mmol/L (ref 3.5–5.2)
Sodium: 136 mmol/L (ref 134–144)
Total Protein: 7 g/dL (ref 6.0–8.5)
eGFR: 55 mL/min/{1.73_m2} — ABNORMAL LOW (ref >59)

## 2023-08-12 LAB — CBC WITH DIFFERENTIAL/PLATELET
Basophils Absolute: 0.1 10*3/uL (ref 0.0–0.2)
Basos: 2 %
EOS (ABSOLUTE): 0.7 10*3/uL — ABNORMAL HIGH (ref 0.0–0.4)
Eos: 11 %
Hematocrit: 44.3 % (ref 34.0–46.6)
Hemoglobin: 14.1 g/dL (ref 11.1–15.9)
Immature Grans (Abs): 0 10*3/uL (ref 0.0–0.1)
Immature Granulocytes: 0 %
Lymphocytes Absolute: 2 10*3/uL (ref 0.7–3.1)
Lymphs: 32 %
MCH: 29.1 pg (ref 26.6–33.0)
MCHC: 31.8 g/dL (ref 31.5–35.7)
MCV: 91 fL (ref 79–97)
Monocytes Absolute: 0.5 10*3/uL (ref 0.1–0.9)
Monocytes: 8 %
Neutrophils Absolute: 3 10*3/uL (ref 1.4–7.0)
Neutrophils: 47 %
Platelets: 335 10*3/uL (ref 150–450)
RBC: 4.85 x10E6/uL (ref 3.77–5.28)
RDW: 13.3 % (ref 11.7–15.4)
WBC: 6.2 10*3/uL (ref 3.4–10.8)

## 2023-08-12 LAB — LIPID PANEL
Chol/HDL Ratio: 4.4 {ratio} (ref 0.0–4.4)
Cholesterol, Total: 254 mg/dL — ABNORMAL HIGH (ref 100–199)
HDL: 58 mg/dL (ref >39)
LDL Chol Calc (NIH): 161 mg/dL — ABNORMAL HIGH (ref 0–99)
Triglycerides: 190 mg/dL — ABNORMAL HIGH (ref 0–149)
VLDL Cholesterol Cal: 35 mg/dL (ref 5–40)

## 2023-08-12 LAB — HEMOGLOBIN A1C
Est. average glucose Bld gHb Est-mCnc: 111 mg/dL
Hgb A1c MFr Bld: 5.5 % (ref 4.8–5.6)

## 2023-08-12 LAB — VITAMIN D 25 HYDROXY (VIT D DEFICIENCY, FRACTURES): Vit D, 25-Hydroxy: 44.6 ng/mL (ref 30.0–100.0)

## 2023-08-12 LAB — TSH+FREE T4
Free T4: 1.23 ng/dL (ref 0.82–1.77)
TSH: 3.93 u[IU]/mL (ref 0.450–4.500)

## 2023-08-15 NOTE — Telephone Encounter (Unsigned)
Copied from CRM [id number]. Topic: Clinical - Lab/Test Results >> Aug 15, 2023 11:13 AM Gildardo Pounds wrote: Reason for CRM: Patient wants  to speak with someone regarding lab results. She read her MyChart but did not understand what everything meant. Callback number is [phone]

## 2023-08-25 ENCOUNTER — Ambulatory Visit (INDEPENDENT_AMBULATORY_CARE_PROVIDER_SITE_OTHER): Payer: Medicare Other | Admitting: Orthopedic Surgery

## 2023-08-25 ENCOUNTER — Other Ambulatory Visit (INDEPENDENT_AMBULATORY_CARE_PROVIDER_SITE_OTHER): Payer: Self-pay

## 2023-08-25 DIAGNOSIS — M25511 Pain in right shoulder: Secondary | ICD-10-CM

## 2023-08-25 NOTE — Progress Notes (Signed)
 Orthopedic Surgery Post-operative Office Visit   Procedure: left knee hematoma incision and drainage Date of Surgery: 06/03/2023 (~3 months post-op)   Assessment: Patient is a 88 y.o. who is doing well from her knee surgery. Is complaining of right shoulder pain that seems consistent with osteoarthritis     Plan: -No restrictions for her knee at this point -For her shoulder, we did a steroid injection today in the office and I referred there to PT -Pain management: tylenol -Return to office in on an as needed basis   Right shoulder injection note: After discussing the risks, benefits, alternatives of right shoulder injection, patient elected to proceed.  Patient was in the seated position.  The area around the acromion was prepped with an alcohol based prep.  The skin was anesthetized with ethyl chloride.  A 20-gauge needle was used to inject 1 cc of Depo-Medrol, 1 cc of bupivacaine, 1 cc of lidocaine under standard sterile technique into the subacromial space.  Needle was withdrawn and Band-Aid was applied.  Patient tolerated the procedure well.   ___________________________________________________________________________     Subjective: Patient's knee has been doing well.  She is not having any pain in the knee.  She has not noticed any swelling around the knee.  She has not any redness or drainage around the knee.  She is able to walk is much as she would like.  More recently, she is noticed right shoulder pain.  She feels that in the lateral aspect of the shoulder and in the posterior aspect of the shoulder.  There was no trauma or injury that preceded the onset of pain.  She says the pain was worse within the last week because she was doing some housework.  She notices the pain when she is moving the arm but does not notice it at rest.  No pain radiating down the arm.    Objective:   General: no acute distress, appropriate affect Neurologic: alert, answering questions appropriately,  following commands Respiratory: unlabored breathing on room air Skin: incision over knee is well healed   MSK  -RLE: knee ROM from 0-110, EHL/TA/GSC intact, SILT in s/s/dp/sp/t nerve distributions, foot warm and well perfused   -RUE: Pain but no weakness with Jobe test, no weakness with external rotation with arm at side, negative belly press, pain with external rotation past 70 degrees, no pain through remainder of range of motion, AIN/PIN/IO intact, hand warm and well-perfused, palpable radial pulse, sensation intact to light touch in median/radial/ulnar nerve distributions   Imaging: XRs of the right shoulder from 08/25/2023 were independently reviewed and interpreted, showing joint space narrowing in the glenohumeral and AC joints.  Bone spur seen within the [facility] joint.  Small osteophyte at the inferior aspect of the humerus.  No fracture or dislocation seen.     Patient name: Lydia Graham Patient MRN: [id number] Date of visit: 08/25/23

## 2023-09-01 ENCOUNTER — Other Ambulatory Visit: Payer: Self-pay | Admitting: Cardiology

## 2023-09-05 ENCOUNTER — Ambulatory Visit (HOSPITAL_COMMUNITY): Attending: Orthopedic Surgery | Admitting: Occupational Therapy

## 2023-09-05 ENCOUNTER — Other Ambulatory Visit: Payer: Self-pay

## 2023-09-05 ENCOUNTER — Encounter (HOSPITAL_COMMUNITY): Payer: Self-pay | Admitting: Occupational Therapy

## 2023-09-05 DIAGNOSIS — M25611 Stiffness of right shoulder, not elsewhere classified: Secondary | ICD-10-CM | POA: Insufficient documentation

## 2023-09-05 DIAGNOSIS — M25511 Pain in right shoulder: Secondary | ICD-10-CM | POA: Insufficient documentation

## 2023-09-05 DIAGNOSIS — R29898 Other symptoms and signs involving the musculoskeletal system: Secondary | ICD-10-CM | POA: Diagnosis not present

## 2023-09-05 NOTE — Therapy (Addendum)
 OUTPATIENT OCCUPATIONAL THERAPY ORTHO EVALUATION  Patient Name: Lydia Graham MRN: [id number] DOB:Dec 25, 1935, 88 y.o., female Today's Date: 09/05/2023   END OF SESSION:   09/05/23 1057  OT Visits / Re-Eval  Visit Number 1  Number of Visits 8  Date for OT Re-Evaluation 10/10/23  Authorization  Authorization Type Medicare Part A/B, [employer] [place]  OT Time Calculation  OT Start Time 1020  OT Stop Time 1051  OT Time Calculation (min) 31 min  End of Session  Activity Tolerance Patient tolerated treatment well  Behavior During Therapy WFL for tasks assessed/performed    Past Medical History:  Diagnosis Date   Arthritis    oa, all over - multiple areas    Carotid artery occlusion    Dysrhythmia    AFib    GERD (gastroesophageal reflux disease)    Headache    h/o migraines    Hypertension    PAF (paroxysmal atrial fibrillation) (HCC)    a. multiple DCCV's in 2019 and eventually required initiation of Amiodarone with successful DCCV after this. b. recurrent in 12/2021 while admitted for Urosepsis   Past Surgical History:  Procedure Laterality Date   ABDOMINAL HYSTERECTOMY     APPENDECTOMY     BACK SURGERY     CARDIOVERSION N/A 12/19/2017   Procedure: CARDIOVERSION;  Surgeon: Antoine Poche, MD;  [facility];  Service: Endoscopy;  Laterality: N/A;   CARDIOVERSION N/A 12/26/2017   Procedure: CARDIOVERSION;  Surgeon: Chrystie Nose, MD;  Location: [facility] ENDOSCOPY;  Service: Cardiovascular;  Laterality: N/A;   CARDIOVERSION N/A 01/26/2018   Procedure: CARDIOVERSION;  Surgeon: Jake Bathe, MD;  Location: [facility] ENDOSCOPY;  Service: Cardiovascular;  Laterality: N/A;   CAROTID ENDARTERECTOMY     CATARACT EXTRACTION W/PHACO Right 03/09/2021   Procedure: CATARACT EXTRACTION PHACO AND INTRAOCULAR LENS PLACEMENT with Placement of Corticosteroid (IOC);  Surgeon: Fabio Pierce, MD;  Location: [facility] ORS;  Service: Ophthalmology;  Laterality: Right;  CDE 9.16   CATARACT  EXTRACTION W/PHACO Left 04/02/2021   Procedure: CATARACT EXTRACTION PHACO AND INTRAOCULAR LENS PLACEMENT LEFT EYE;  Surgeon: Fabio Pierce, MD;  Location: [facility] ORS;  Service: Ophthalmology;  Laterality: Left;  left CDE=6.75   ENDARTERECTOMY Left 03/27/2017   Procedure: ENDARTERECTOMY CAROTID LEFT;  Surgeon: Maeola Harman, MD;  Location: [facility] OR;  Service: Vascular;  Laterality: Left;   INNER EAR SURGERY Right    puntured eardrum- repaired 2x's    KNEE ARTHROPLASTY     NASAL SINUS SURGERY     deviated septum   PATCH ANGIOPLASTY Left 03/27/2017   Procedure: PATCH ANGIOPLASTY LEFT CAROTID ARTERY USING Livia Snellen BIOLOGIC PATCH;  Surgeon: Maeola Harman, MD;  Location: [facility] OR;  Service: Vascular;  Laterality: Left;   TONSILLECTOMY     TOTAL KNEE ARTHROPLASTY Right 10/23/2016   Procedure: RIGHT TOTAL KNEE ARTHROPLASTY;  Surgeon: Nadara Mustard, MD;  Location: [facility] OR;  Service: Orthopedics;  Laterality: Right;   Patient Active Problem List   Diagnosis Date Noted   Upper respiratory infection 06/19/2023   Cervical spondylosis 06/10/2023   Hematoma of left knee region 06/03/2023   Right shoulder pain 05/01/2023   Cellulitis of left lower extremity 02/15/2023   Sinus pause 02/11/2023   LBBB (left bundle branch block) 02/11/2023   Knee pain 02/09/2023   Cellulitis 02/09/2023   Bursitis 02/08/2023   Normocytic anemia 02/08/2023   Thyroid nodule greater than or equal to 1.5 cm in diameter incidentally noted on imaging study 09/03/2022   Myofascial neck  pain 09/03/2022   Lumbar radiculopathy 05/08/2022   Hyperlipidemia 05/08/2022   Cellulitis of right forearm 01/22/2022   Sepsis secondary to UTI (HCC) 12/26/2021   Acute pyelonephritis 12/25/2021   Acute metabolic encephalopathy 12/25/2021   Sepsis (HCC) 12/25/2021   AKI (acute kidney injury) (HCC) 12/25/2021   Hyponatremia 12/25/2021   Dehydration 12/25/2021   Atrial fibrillation, chronic (HCC) 12/25/2021   Essential  hypertension 12/25/2021   GERD (gastroesophageal reflux disease) 12/25/2021   Acquired trigger finger of left middle finger 08/04/2020   Arthritis of hand 08/04/2020   Osteoarthritis of metacarpophalangeal (MCP) joint 08/03/2020   Bilateral hand pain 06/22/2020   Osteoarthritis of hands, 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   Osteoarthritis of feet, 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   PAF (paroxysmal atrial fibrillation) (HCC)    Chronic cough 12/29/2017   Atrial fibrillation with rapid ventricular response (HCC) 12/23/2017   Acute on chronic combined systolic and diastolic CHF (congestive heart failure) (HCC)    Menopausal symptom 10/17/2017   Menopausal syndrome 10/17/2017   Carotid stenosis 03/27/2017   S/P total knee arthroplasty, right 10/23/2016   Unilateral primary osteoarthritis, right knee 07/13/2016    PCP: [name], FNP REFERRING PROVIDER: Willia Craze, MD  ONSET DATE: ~1 year  REFERRING DIAG: M25.511 (ICD-10-CM) - Acute pain of right shoulder   THERAPY DIAG:  No diagnosis found.  Rationale for Evaluation and Treatment: Rehabilitation  SUBJECTIVE:   SUBJECTIVE STATEMENT: "The pain is stopping me from doing a lot of things" Pt accompanied by: self  PERTINENT HISTORY: Pt has had on going R shoulder pain for approximately 1 year, with multiple MD visits and steroid injections. Most recently seeing spine surgeon who determined severe osteoarthritis with x-ray. No recent MRI's.   PRECAUTIONS: None  WEIGHT BEARING RESTRICTIONS: No  PAIN:  Are you having pain? Yes: NPRS scale: 8/10 Pain location: trapezius and posterior shoulder girdle Pain description: sharp Aggravating factors: certain movements Relieving factors: heating pad  FALLS: Has patient fallen in last 6 months? No  PLOF: Independent  PATIENT GOALS: I would like to be free of some of the pain and more mobile.   NEXT MD VISIT: None  OBJECTIVE:   HAND DOMINANCE: Right  ADLs: Overall ADLs: Pt has mod to max  difficulty with cleaning due to pain and weakness, as well as mod difficulty with cooking due to limited lifting ability. Pt having increased difficulty sleeping, waking multiple times a night due to pain.   FUNCTIONAL OUTCOME MEASURES: Upper Extremity Functional Scale (UEFS): 30/80 - 37.5%  UPPER EXTREMITY ROM:       Assessed in seated, er/IR adducted  Active ROM Right eval  Shoulder flexion 141  Shoulder abduction 129  Shoulder internal rotation 90  Shoulder external rotation 47  (Blank rows = not tested)    UPPER EXTREMITY MMT:     Assessed in seated, er/IR adducted  MMT Right eval  Shoulder flexion 4/5 *w/ pain  Shoulder abduction 4/5 *w/ pain  Shoulder internal rotation 4+/5  Shoulder external rotation 4/5  (Blank rows = not tested)  SENSATION: Pt reports numbness/tingling in the upper arm, right distal to the shoulder girdle.   EDEMA: No swelling noted  OBSERVATIONS: moderate fascial restrictions along the trapezius, scapular region, biceps, and deltoid.   TODAY'S TREATMENT:  DATE:   -Evaluation  -Measurements -Table Slides: flexion, abduction, x10 -Wall Washes: flexion, abduction, x45" each    PATIENT EDUCATION: Education details: Table slides and wall slides Person educated: Patient Education method: Explanation, Demonstration, and Handouts Education comprehension: verbalized understanding and returned demonstration  HOME EXERCISE PROGRAM: 3/14: Table Slides and wall slides  GOALS: Goals reviewed with patient? Yes   SHORT TERM GOALS: Target date: 10/10/23  Pt will be provided with and educated on HEP to improve mobility in RUE required for use during ADL completion.   Goal status: INITIAL  LONG TERM GOALS: Target date: 10/10/23  Pt will decrease pain in RUE to 3/10 or less to improve ability to sleep for 2+ consecutive  hours without waking due to pain.   Goal status: INITIAL  2.  Pt will decrease RUE fascial restrictions to min amounts or less to improve mobility required for functional reaching tasks.   Goal status: INITIAL  3.  Pt will increase RUE A/ROM by 15+ degrees to improve ability to use RUE when reaching overhead or behind back during dressing and bathing tasks.   Goal status: INITIAL  4.  Pt will increase RUE strength to 5/5 or greater to improve ability to use RUE when lifting or carrying items during meal preparation/housework/yardwork tasks.   Goal status: INITIAL  5.  Pt will return to highest level of function using RUE as dominant during functional task completion.   Goal status: INITIAL   ASSESSMENT:  CLINICAL IMPRESSION: Patient is a 88 y.o. female who was seen today for occupational therapy evaluation for R Shoulder pain. Pt presents with increased pain and fascial restrictions, decreased ROM, strength, and functional use of the RUE.   PERFORMANCE DEFICITS: in functional skills including in functional skills including ADLs, IADLs, coordination, tone, ROM, strength, pain, fascial restrictions, muscle spasms, and UE functional use.  IMPAIRMENTS: are limiting patient from ADLs, IADLs, rest and sleep, work, leisure, and social participation.   COMORBIDITIES: has no other co-morbidities that affects occupational performance. Patient will benefit from skilled OT to address above impairments and improve overall function.  MODIFICATION OR ASSISTANCE TO COMPLETE EVALUATION: No modification of tasks or assist necessary to complete an evaluation.  OT OCCUPATIONAL PROFILE AND HISTORY: Problem focused assessment: Including review of records relating to presenting problem.  CLINICAL DECISION MAKING: LOW - limited treatment options, no task modification necessary  REHAB POTENTIAL: Good  EVALUATION COMPLEXITY: Low      PLAN:  OT FREQUENCY: 2x/week  OT DURATION: 4  weeks  PLANNED INTERVENTIONS: 97168 OT Re-evaluation, 97535 self care/ADL training, 08657 therapeutic exercise, 97530 therapeutic activity, 97140 manual therapy, 97035 ultrasound, 97010 moist heat, 97032 electrical stimulation (manual), passive range of motion, energy conservation, coping strategies training, patient/family education, and DME and/or AE instructions  RECOMMENDED OTHER SERVICES: N/A  CONSULTED AND AGREED WITH PLAN OF CARE: Patient  PLAN FOR NEXT SESSION: Manual Therapy, P/ROM, AA/ROM, A/ROM, Isometrics   Analyn Matusek Bing Plume, OTR/L [facility] [phone] Brylan Dec Rosemarie Beath, OT 09/05/2023, 10:23 AM

## 2023-09-05 NOTE — Patient Instructions (Signed)

## 2023-09-09 ENCOUNTER — Ambulatory Visit: Admitting: Physical Therapy

## 2023-09-10 ENCOUNTER — Encounter (HOSPITAL_COMMUNITY): Payer: Self-pay | Admitting: Occupational Therapy

## 2023-09-10 ENCOUNTER — Ambulatory Visit (HOSPITAL_COMMUNITY): Admitting: Occupational Therapy

## 2023-09-10 DIAGNOSIS — R29898 Other symptoms and signs involving the musculoskeletal system: Secondary | ICD-10-CM

## 2023-09-10 DIAGNOSIS — M25511 Pain in right shoulder: Secondary | ICD-10-CM

## 2023-09-10 DIAGNOSIS — M25611 Stiffness of right shoulder, not elsewhere classified: Secondary | ICD-10-CM

## 2023-09-10 NOTE — Therapy (Signed)
 OUTPATIENT OCCUPATIONAL THERAPY ORTHO TREATMENT NOTE  Patient Name: Lydia Graham MRN: [id number] DOB:1935/12/23, 88 y.o., female Today's Date: 09/10/2023   END OF SESSION:  OT End of Session - 09/10/23 1559     Visit Number 2    Number of Visits 8    Date for OT Re-Evaluation 10/10/23    Authorization Type Medicare Part A/B, [employer] [place]    OT Start Time [phone]    OT Stop Time 1552    OT Time Calculation (min) 44 min    Activity Tolerance Patient tolerated treatment well    Behavior During Therapy WFL for tasks assessed/performed             Past Medical History:  Diagnosis Date   Arthritis    oa, all over - multiple areas    Carotid artery occlusion    Dysrhythmia    AFib    GERD (gastroesophageal reflux disease)    Headache    h/o migraines    Hypertension    PAF (paroxysmal atrial fibrillation) (HCC)    a. multiple DCCV's in 2019 and eventually required initiation of Amiodarone with successful DCCV after this. b. recurrent in 12/2021 while admitted for Urosepsis   Past Surgical History:  Procedure Laterality Date   ABDOMINAL HYSTERECTOMY     APPENDECTOMY     BACK SURGERY     CARDIOVERSION N/A 12/19/2017   Procedure: CARDIOVERSION;  Surgeon: Antoine Poche, MD;  [facility];  Service: Endoscopy;  Laterality: N/A;   CARDIOVERSION N/A 12/26/2017   Procedure: CARDIOVERSION;  Surgeon: Chrystie Nose, MD;  Location: [facility] ENDOSCOPY;  Service: Cardiovascular;  Laterality: N/A;   CARDIOVERSION N/A 01/26/2018   Procedure: CARDIOVERSION;  Surgeon: Jake Bathe, MD;  Location: [facility] ENDOSCOPY;  Service: Cardiovascular;  Laterality: N/A;   CAROTID ENDARTERECTOMY     CATARACT EXTRACTION W/PHACO Right 03/09/2021   Procedure: CATARACT EXTRACTION PHACO AND INTRAOCULAR LENS PLACEMENT with Placement of Corticosteroid (IOC);  Surgeon: Fabio Pierce, MD;  Location: [facility] ORS;  Service: Ophthalmology;  Laterality: Right;  CDE 9.16   CATARACT EXTRACTION W/PHACO Left  04/02/2021   Procedure: CATARACT EXTRACTION PHACO AND INTRAOCULAR LENS PLACEMENT LEFT EYE;  Surgeon: Fabio Pierce, MD;  Location: [facility] OR[facility];  Service: Ophthalmology;  Laterality: Left;  left CDE=6.75   ENDARTERECTOMY Left 03/27/2017   Procedure: ENDARTERECTOMY CAROTID LEFT;  Surgeon: Maeola Harman, MD;  Location: [facility] OR;  Service: Vascular;  Laterality: Left;   INNER EAR SURGERY Right    puntured eardrum- repaired 2x's    KNEE ARTHROPLASTY     NASAL SINUS SURGERY     deviated septum   PATCH ANGIOPLASTY Left 03/27/2017   Procedure: PATCH ANGIOPLASTY LEFT CAROTID ARTERY USING Livia Snellen BIOLOGIC PATCH;  Surgeon: Maeola Harman, MD;  Location: [facility] OR;  Service: Vascular;  Laterality: Left;   TONSILLECTOMY     TOTAL KNEE ARTHROPLASTY Right 10/23/2016   Procedure: RIGHT TOTAL KNEE ARTHROPLASTY;  Surgeon: Nadara Mustard, MD;  Location: [facility] OR;  Service: Orthopedics;  Laterality: Right;   Patient Active Problem List   Diagnosis Date Noted   Upper respiratory infection 06/19/2023   Cervical spondylosis 06/10/2023   Hematoma of left knee region 06/03/2023   Right shoulder pain 05/01/2023   Cellulitis of left lower extremity 02/15/2023   Sinus pause 02/11/2023   LBBB (left bundle branch block) 02/11/2023   Knee pain 02/09/2023   Cellulitis 02/09/2023   Bursitis 02/08/2023   Normocytic anemia 02/08/2023   Thyroid nodule greater  than or equal to 1.5 cm in diameter incidentally noted on imaging study 09/03/2022   Myofascial neck pain 09/03/2022   Lumbar radiculopathy 05/08/2022   Hyperlipidemia 05/08/2022   Cellulitis of right forearm 01/22/2022   Sepsis secondary to UTI (HCC) 12/26/2021   Acute pyelonephritis 12/25/2021   Acute metabolic encephalopathy 12/25/2021   Sepsis (HCC) 12/25/2021   AKI (acute kidney injury) (HCC) 12/25/2021   Hyponatremia 12/25/2021   Dehydration 12/25/2021   Atrial fibrillation, chronic (HCC) 12/25/2021   Essential hypertension 12/25/2021   GERD  (gastroesophageal reflux disease) 12/25/2021   Acquired trigger finger of left middle finger 08/04/2020   Arthritis of hand 08/04/2020   Osteoarthritis of metacarpophalangeal (MCP) joint 08/03/2020   Bilateral hand pain 06/22/2020   Osteoarthritis of hands, 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   Osteoarthritis of feet, 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   PAF (paroxysmal atrial fibrillation) (HCC)    Chronic cough 12/29/2017   Atrial fibrillation with rapid ventricular response (HCC) 12/23/2017   Acute on chronic combined systolic and diastolic CHF (congestive heart failure) (HCC)    Menopausal symptom 10/17/2017   Menopausal syndrome 10/17/2017   Carotid stenosis 03/27/2017   S/P total knee arthroplasty, right 10/23/2016   Unilateral primary osteoarthritis, right knee 07/13/2016    PCP: [name], FNP REFERRING PROVIDER: Willia Craze, MD  ONSET DATE: ~1 year  REFERRING DIAG: M25.511 (ICD-10-CM) - Acute pain of right shoulder   THERAPY DIAG:  Acute pain of right shoulder  Shoulder stiffness, right  Other symptoms and signs involving the musculoskeletal system  Rationale for Evaluation and Treatment: Rehabilitation  SUBJECTIVE:   SUBJECTIVE STATEMENT: "I want to learn what I can and can't do" Pt accompanied by: self  PERTINENT HISTORY: Pt has had on going R shoulder pain for approximately 1 year, with multiple MD visits and steroid injections. Most recently seeing spine surgeon who determined severe osteoarthritis with x-ray. No recent MRI's.   PRECAUTIONS: None  WEIGHT BEARING RESTRICTIONS: No  PAIN:  Are you having pain? Yes: NPRS scale: 3/10 Pain location: trapezius and posterior shoulder girdle Pain description: sharp Aggravating factors: certain movements Relieving factors: heating pad  FALLS: Has patient fallen in last 6 months? No  PLOF: Independent  PATIENT GOALS: I would like to be free of some of the pain and more mobile.   NEXT MD VISIT: None  OBJECTIVE:   HAND  DOMINANCE: Right  ADLs: Overall ADLs: Pt has mod to max difficulty with cleaning due to pain and weakness, as well as mod difficulty with cooking due to limited lifting ability. Pt having increased difficulty sleeping, waking multiple times a night due to pain.   FUNCTIONAL OUTCOME MEASURES: Upper Extremity Functional Scale (UEFS): 30/80 - 37.5%  UPPER EXTREMITY ROM:       Assessed in seated, er/IR adducted  Active ROM Right eval  Shoulder flexion 141  Shoulder abduction 129  Shoulder internal rotation 90  Shoulder external rotation 47  (Blank rows = not tested)    UPPER EXTREMITY MMT:     Assessed in seated, er/IR adducted  MMT Right eval  Shoulder flexion 4/5 *w/ pain  Shoulder abduction 4/5 *w/ pain  Shoulder internal rotation 4+/5  Shoulder external rotation 4/5  (Blank rows = not tested)  SENSATION: Pt reports numbness/tingling in the upper arm, right distal to the shoulder girdle.   EDEMA: No swelling noted  OBSERVATIONS: moderate fascial restrictions along the trapezius, scapular region, biceps, and deltoid.   TODAY'S TREATMENT:  DATE:   09/10/23 -Manual therapy: myofascial release and trigger point applied to biceps, trapezius, and scapular region in order to reduce pain and fascial restrictions, as well as improve ROM -P/ROM: supine, flexion, abduction, er/IR, x10  -AA/ROM: supine, flexion, abduction, protraction, horizontal abduction, er/IR, x10 -Wall Washes: flexion, abduction, x45" each   09/05/23 -Evaluation  -Measurements -Table Slides: flexion, abduction, x10 -Wall Washes: flexion, abduction, x45" each    PATIENT EDUCATION: Education details: Table slides and wall slides Person educated: Patient Education method: Explanation, Demonstration, and Handouts Education comprehension: verbalized understanding and returned  demonstration  HOME EXERCISE PROGRAM: 3/14: Table Slides and wall slides  GOALS: Goals reviewed with patient? Yes   SHORT TERM GOALS: Target date: 10/10/23  Pt will be provided with and educated on HEP to improve mobility in RUE required for use during ADL completion.   Goal status: IN PROGRESS  LONG TERM GOALS: Target date: 10/10/23  Pt will decrease pain in RUE to 3/10 or less to improve ability to sleep for 2+ consecutive hours without waking due to pain.   Goal status: IN PROGRESS  2.  Pt will decrease RUE fascial restrictions to min amounts or less to improve mobility required for functional reaching tasks.   Goal status: IN PROGRESS  3.  Pt will increase RUE A/ROM by 15+ degrees to improve ability to use RUE when reaching overhead or behind back during dressing and bathing tasks.   Goal status: IN PROGRESS  4.  Pt will increase RUE strength to 5/5 or greater to improve ability to use RUE when lifting or carrying items during meal preparation/housework/yardwork tasks.   Goal status: IN PROGRESS  5.  Pt will return to highest level of function using RUE as dominant during functional task completion.   Goal status: IN PROGRESS   ASSESSMENT:  CLINICAL IMPRESSION: This session pt presenting with improved overall pain and ROM. She was able to tolerate full ROM passively and 85% of full ROM assisted with dowel. Her pain did not increase with exercises this session. OT providing verbal and tactile cuing for positioning and technique throughout session.   PERFORMANCE DEFICITS: in functional skills including in functional skills including ADLs, IADLs, coordination, tone, ROM, strength, pain, fascial restrictions, muscle spasms, and UE functional use.   PLAN:  OT FREQUENCY: 2x/week  OT DURATION: 4 weeks  PLANNED INTERVENTIONS: 97168 OT Re-evaluation, 97535 self care/ADL training, 95284 therapeutic exercise, 97530 therapeutic activity, 97140 manual therapy, 97035  ultrasound, 97010 moist heat, 97032 electrical stimulation (manual), passive range of motion, energy conservation, coping strategies training, patient/family education, and DME and/or AE instructions  RECOMMENDED OTHER SERVICES: N/A  CONSULTED AND AGREED WITH PLAN OF CARE: Patient  PLAN FOR NEXT SESSION: Manual Therapy, P/ROM, AA/ROM, A/ROM, Isometrics   Maloree Uplinger Bing Plume, OTR/L [facility] [phone] Breahna Boylen Rosemarie Beath, OT 09/10/2023, 4:00 PM

## 2023-09-10 NOTE — Patient Instructions (Signed)

## 2023-09-12 ENCOUNTER — Encounter (HOSPITAL_COMMUNITY): Admitting: Occupational Therapy

## 2023-09-16 ENCOUNTER — Ambulatory Visit (HOSPITAL_COMMUNITY): Admitting: Occupational Therapy

## 2023-09-16 ENCOUNTER — Encounter (HOSPITAL_COMMUNITY): Payer: Self-pay | Admitting: Occupational Therapy

## 2023-09-16 DIAGNOSIS — M25611 Stiffness of right shoulder, not elsewhere classified: Secondary | ICD-10-CM | POA: Diagnosis not present

## 2023-09-16 DIAGNOSIS — R29898 Other symptoms and signs involving the musculoskeletal system: Secondary | ICD-10-CM

## 2023-09-16 DIAGNOSIS — M25511 Pain in right shoulder: Secondary | ICD-10-CM

## 2023-09-16 NOTE — Therapy (Signed)
 OUTPATIENT OCCUPATIONAL THERAPY ORTHO TREATMENT NOTE  Patient Name: Lydia Graham MRN: [id number] DOB:June 03, 1936, 88 y.o., female Today's Date: 09/16/2023   END OF SESSION:  OT End of Session - 09/16/23 1052     Visit Number 3    Number of Visits 8    Date for OT Re-Evaluation 10/10/23    Authorization Type Medicare Part A/B, [employer] [place]    OT Start Time 1010    OT Stop Time 1051    OT Time Calculation (min) 41 min    Activity Tolerance Patient tolerated treatment well    Behavior During Therapy WFL for tasks assessed/performed              Past Medical History:  Diagnosis Date   Arthritis    oa, all over - multiple areas    Carotid artery occlusion    Dysrhythmia    AFib    GERD (gastroesophageal reflux disease)    Headache    h/o migraines    Hypertension    PAF (paroxysmal atrial fibrillation) (HCC)    a. multiple DCCV's in 2019 and eventually required initiation of Amiodarone with successful DCCV after this. b. recurrent in 12/2021 while admitted for Urosepsis   Past Surgical History:  Procedure Laterality Date   ABDOMINAL HYSTERECTOMY     APPENDECTOMY     BACK SURGERY     CARDIOVERSION N/A 12/19/2017   Procedure: CARDIOVERSION;  Surgeon: Antoine Poche, MD;  Location: [facility];  Service: Endoscopy;  Laterality: N/A;   CARDIOVERSION N/A 12/26/2017   Procedure: CARDIOVERSION;  Surgeon: Chrystie Nose, MD;  Location: [facility] ENDOSCOPY;  Service: Cardiovascular;  Laterality: N/A;   CARDIOVERSION N/A 01/26/2018   Procedure: CARDIOVERSION;  Surgeon: Jake Bathe, MD;  Location: [facility] ENDOSCOPY;  Service: Cardiovascular;  Laterality: N/A;   CAROTID ENDARTERECTOMY     CATARACT EXTRACTION W/PHACO Right 03/09/2021   Procedure: CATARACT EXTRACTION PHACO AND INTRAOCULAR LENS PLACEMENT with Placement of Corticosteroid (IOC);  Surgeon: Fabio Pierce, MD;  Location: [facility] ORS;  Service: Ophthalmology;  Laterality: Right;  CDE 9.16   CATARACT EXTRACTION W/PHACO Left  04/02/2021   Procedure: CATARACT EXTRACTION PHACO AND INTRAOCULAR LENS PLACEMENT LEFT EYE;  Surgeon: Fabio Pierce, MD;  Location: [facility] ORS;  Service: Ophthalmology;  Laterality: Left;  left CDE=6.75   ENDARTERECTOMY Left 03/27/2017   Procedure: ENDARTERECTOMY CAROTID LEFT;  Surgeon: Maeola Harman, MD;  Location: [facility] OR;  Service: Vascular;  Laterality: Left;   INNER EAR SURGERY Right    puntured eardrum- repaired 2x's    KNEE ARTHROPLASTY     NASAL SINUS SURGERY     deviated septum   PATCH ANGIOPLASTY Left 03/27/2017   Procedure: PATCH ANGIOPLASTY LEFT CAROTID ARTERY USING Livia Snellen BIOLOGIC PATCH;  Surgeon: Maeola Harman, MD;  Location: [facility] OR;  Service: Vascular;  Laterality: Left;   TONSILLECTOMY     TOTAL KNEE ARTHROPLASTY Right 10/23/2016   Procedure: RIGHT TOTAL KNEE ARTHROPLASTY;  Surgeon: Nadara Mustard, MD;  Location: [facility] OR;  Service: Orthopedics;  Laterality: Right;   Patient Active Problem List   Diagnosis Date Noted   Upper respiratory infection 06/19/2023   Cervical spondylosis 06/10/2023   Hematoma of left knee region 06/03/2023   Right shoulder pain 05/01/2023   Cellulitis of left lower extremity 02/15/2023   Sinus pause 02/11/2023   LBBB (left bundle branch block) 02/11/2023   Knee pain 02/09/2023   Cellulitis 02/09/2023   Bursitis 02/08/2023   Normocytic anemia 02/08/2023   Thyroid nodule  greater than or equal to 1.5 cm in diameter incidentally noted on imaging study 09/03/2022   Myofascial neck pain 09/03/2022   Lumbar radiculopathy 05/08/2022   Hyperlipidemia 05/08/2022   Cellulitis of right forearm 01/22/2022   Sepsis secondary to UTI (HCC) 12/26/2021   Acute pyelonephritis 12/25/2021   Acute metabolic encephalopathy 12/25/2021   Sepsis (HCC) 12/25/2021   AKI (acute kidney injury) (HCC) 12/25/2021   Hyponatremia 12/25/2021   Dehydration 12/25/2021   Atrial fibrillation, chronic (HCC) 12/25/2021   Essential hypertension 12/25/2021   GERD  (gastroesophageal reflux disease) 12/25/2021   Acquired trigger finger of left middle finger 08/04/2020   Arthritis of hand 08/04/2020   Osteoarthritis of metacarpophalangeal (MCP) joint 08/03/2020   Bilateral hand pain 06/22/2020   Osteoarthritis of hands, 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   Osteoarthritis of feet, 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   PAF (paroxysmal atrial fibrillation) (HCC)    Chronic cough 12/29/2017   Atrial fibrillation with rapid ventricular response (HCC) 12/23/2017   Acute on chronic combined systolic and diastolic CHF (congestive heart failure) (HCC)    Menopausal symptom 10/17/2017   Menopausal syndrome 10/17/2017   Carotid stenosis 03/27/2017   S/P total knee arthroplasty, right 10/23/2016   Unilateral primary osteoarthritis, right knee 07/13/2016    PCP: [name], FNP REFERRING PROVIDER: Willia Craze, MD  ONSET DATE: ~1 year  REFERRING DIAG: M25.511 (ICD-10-CM) - Acute pain of right shoulder   THERAPY DIAG:  Acute pain of right shoulder  Shoulder stiffness, right  Other symptoms and signs involving the musculoskeletal system  Rationale for Evaluation and Treatment: Rehabilitation  SUBJECTIVE:   SUBJECTIVE STATEMENT: "I'm feeling better."  PERTINENT HISTORY: Pt has had on going R shoulder pain for approximately 1 year, with multiple MD visits and steroid injections. Most recently seeing spine surgeon who determined severe osteoarthritis with x-ray. No recent MRI's.   PRECAUTIONS: None  WEIGHT BEARING RESTRICTIONS: No  PAIN:  Are you having pain? Yes: NPRS scale: 3/10 Pain location: trapezius and posterior shoulder girdle Pain description: sharp Aggravating factors: certain movements Relieving factors: heating pad  FALLS: Has patient fallen in last 6 months? No  PLOF: Independent  PATIENT GOALS: I would like to be free of some of the pain and more mobile.   NEXT MD VISIT: None  OBJECTIVE:   HAND DOMINANCE: Right  ADLs: Overall ADLs: Pt has  mod to max difficulty with cleaning due to pain and weakness, as well as mod difficulty with cooking due to limited lifting ability. Pt having increased difficulty sleeping, waking multiple times a night due to pain.   FUNCTIONAL OUTCOME MEASURES: Upper Extremity Functional Scale (UEFS): 30/80 - 37.5%  UPPER EXTREMITY ROM:       Assessed in seated, er/IR adducted  Active ROM Right eval  Shoulder flexion 141  Shoulder abduction 129  Shoulder internal rotation 90  Shoulder external rotation 47  (Blank rows = not tested)    UPPER EXTREMITY MMT:     Assessed in seated, er/IR adducted  MMT Right eval  Shoulder flexion 4/5 *w/ pain  Shoulder abduction 4/5 *w/ pain  Shoulder internal rotation 4+/5  Shoulder external rotation 4/5  (Blank rows = not tested)  SENSATION: Pt reports numbness/tingling in the upper arm, right distal to the shoulder girdle.   EDEMA: No swelling noted  OBSERVATIONS: moderate fascial restrictions along the trapezius, scapular region, biceps, and deltoid.   TODAY'S TREATMENT:  DATE:  09/16/23 -Manual therapy: myofascial release and trigger point applied to biceps, trapezius, and scapular region in order to reduce pain and fascial restrictions, as well as improve ROM -P/ROM: supine, flexion, abduction, er/IR, 5 reps -A/ROM: supine-protraction, flexion, abduction, horizontal abduction, er, 10 reps -A/ROM: sitting-protraction, flexion, abduction, horizontal abduction, er, 10 reps -Wall wash: 1' flexion -Functional Reaching: placing 10 cones on middle shelf of overhead cabinet in flexion, removing in abduction  09/10/23 -Manual therapy: myofascial release and trigger point applied to biceps, trapezius, and scapular region in order to reduce pain and fascial restrictions, as well as improve ROM -P/ROM: supine, flexion, abduction,  er/IR, x10  -AA/ROM: supine, flexion, abduction, protraction, horizontal abduction, er/IR, x10 -Wall Washes: flexion, abduction, x45" each   09/05/23 -Evaluation  -Measurements -Table Slides: flexion, abduction, x10 -Wall Washes: flexion, abduction, x45" each    PATIENT EDUCATION: Education details: A/ROM Person educated: Patient Education method: Programmer, multimedia, Demonstration, and Handouts Education comprehension: verbalized understanding and returned demonstration  HOME EXERCISE PROGRAM: 3/14: Table Slides and wall slides 3/25: A/ROM  GOALS: Goals reviewed with patient? Yes   SHORT TERM GOALS: Target date: 10/10/23  Pt will be provided with and educated on HEP to improve mobility in RUE required for use during ADL completion.   Goal status: IN PROGRESS  LONG TERM GOALS: Target date: 10/10/23  Pt will decrease pain in RUE to 3/10 or less to improve ability to sleep for 2+ consecutive hours without waking due to pain.   Goal status: IN PROGRESS  2.  Pt will decrease RUE fascial restrictions to min amounts or less to improve mobility required for functional reaching tasks.   Goal status: IN PROGRESS  3.  Pt will increase RUE A/ROM by 15+ degrees to improve ability to use RUE when reaching overhead or behind back during dressing and bathing tasks.   Goal status: IN PROGRESS  4.  Pt will increase RUE strength to 5/5 or greater to improve ability to use RUE when lifting or carrying items during meal preparation/housework/yardwork tasks.   Goal status: IN PROGRESS  5.  Pt will return to highest level of function using RUE as dominant during functional task completion.   Goal status: IN PROGRESS   ASSESSMENT:  CLINICAL IMPRESSION: Pt reports soreness but no pain today, reports she is feeling better. Completes her HEP when she has time. Continued with manual techniques, trigger point at trapezius and scapula with good response. P/ROM completed with ROM at approximately  85%. Progressed to A/ROM in supine and sitting, pt with good joint mobility and strength, ROM at 80%+, increased discomfort with abduction however was able to complete and felt better with repetitions. Updated HEP for A/ROM. Verbal cuing for form and technique during exercises.   PERFORMANCE DEFICITS: in functional skills including in functional skills including ADLs, IADLs, coordination, tone, ROM, strength, pain, fascial restrictions, muscle spasms, and UE functional use.   PLAN:  OT FREQUENCY: 2x/week  OT DURATION: 4 weeks  PLANNED INTERVENTIONS: 97168 OT Re-evaluation, 97535 self care/ADL training, 57846 therapeutic exercise, 97530 therapeutic activity, 97140 manual therapy, 97035 ultrasound, 97010 moist heat, 97032 electrical stimulation (manual), passive range of motion, energy conservation, coping strategies training, patient/family education, and DME and/or AE instructions  CONSULTED AND AGREED WITH PLAN OF CARE: Patient  PLAN FOR NEXT SESSION: Manual Therapy, A/ROM, functional reaching, add scapular theraband, follow up on HEP   [employer], OTR/L  [phone] 09/16/2023, 10:52 AM

## 2023-09-16 NOTE — Patient Instructions (Signed)

## 2023-09-17 DIAGNOSIS — Z85828 Personal history of other malignant neoplasm of skin: Secondary | ICD-10-CM | POA: Diagnosis not present

## 2023-09-17 DIAGNOSIS — L821 Other seborrheic keratosis: Secondary | ICD-10-CM | POA: Diagnosis not present

## 2023-09-17 DIAGNOSIS — L814 Other melanin hyperpigmentation: Secondary | ICD-10-CM | POA: Diagnosis not present

## 2023-09-18 ENCOUNTER — Encounter (HOSPITAL_COMMUNITY): Payer: Self-pay | Admitting: Occupational Therapy

## 2023-09-18 ENCOUNTER — Ambulatory Visit (HOSPITAL_COMMUNITY): Admitting: Occupational Therapy

## 2023-09-18 DIAGNOSIS — M25511 Pain in right shoulder: Secondary | ICD-10-CM

## 2023-09-18 DIAGNOSIS — M25611 Stiffness of right shoulder, not elsewhere classified: Secondary | ICD-10-CM

## 2023-09-18 DIAGNOSIS — R29898 Other symptoms and signs involving the musculoskeletal system: Secondary | ICD-10-CM

## 2023-09-18 NOTE — Therapy (Signed)
 OUTPATIENT OCCUPATIONAL THERAPY ORTHO TREATMENT NOTE  Patient Name: Lydia Graham MRN: [id number] DOB:December 08, 1935, 88 y.o., female Today's Date: 09/18/2023   END OF SESSION:  OT End of Session - 09/18/23 1337     Visit Number 4    Number of Visits 8    Date for OT Re-Evaluation 10/10/23    Authorization Type Medicare Part A/B, [employer] [place]    OT Start Time 1301    OT Stop Time 1340    OT Time Calculation (min) 39 min    Activity Tolerance Patient tolerated treatment well    Behavior During Therapy WFL for tasks assessed/performed               Past Medical History:  Diagnosis Date   Arthritis    oa, all over - multiple areas    Carotid artery occlusion    Dysrhythmia    AFib    GERD (gastroesophageal reflux disease)    Headache    h/o migraines    Hypertension    PAF (paroxysmal atrial fibrillation) (HCC)    a. multiple DCCV's in 2019 and eventually required initiation of Amiodarone with successful DCCV after this. b. recurrent in 12/2021 while admitted for Urosepsis   Past Surgical History:  Procedure Laterality Date   ABDOMINAL HYSTERECTOMY     APPENDECTOMY     BACK SURGERY     CARDIOVERSION N/A 12/19/2017   Procedure: CARDIOVERSION;  Surgeon: Antoine Poche, MD;  Location: [facility];  Service: Endoscopy;  Laterality: N/A;   CARDIOVERSION N/A 12/26/2017   Procedure: CARDIOVERSION;  Surgeon: Chrystie Nose, MD;  Location: [facility] ENDOSCOPY;  Service: Cardiovascular;  Laterality: N/A;   CARDIOVERSION N/A 01/26/2018   Procedure: CARDIOVERSION;  Surgeon: Jake Bathe, MD;  Location: [facility] ENDOSCOPY;  Service: Cardiovascular;  Laterality: N/A;   CAROTID ENDARTERECTOMY     CATARACT EXTRACTION W/PHACO Right 03/09/2021   Procedure: CATARACT EXTRACTION PHACO AND INTRAOCULAR LENS PLACEMENT with Placement of Corticosteroid (IOC);  Surgeon: Fabio Pierce, MD;  Location: [facility] OR[facility];  Service: Ophthalmology;  Laterality: Right;  CDE 9.16   CATARACT EXTRACTION W/PHACO Left  04/02/2021   Procedure: CATARACT EXTRACTION PHACO AND INTRAOCULAR LENS PLACEMENT LEFT EYE;  Surgeon: Fabio Pierce, MD;  Location: [facility] ORS;  Service: Ophthalmology;  Laterality: Left;  left CDE=6.75   ENDARTERECTOMY Left 03/27/2017   Procedure: ENDARTERECTOMY CAROTID LEFT;  Surgeon: Maeola Harman, MD;  Location: [facility] OR;  Service: Vascular;  Laterality: Left;   INNER EAR SURGERY Right    puntured eardrum- repaired 2x's    KNEE ARTHROPLASTY     NASAL SINUS SURGERY     deviated septum   PATCH ANGIOPLASTY Left 03/27/2017   Procedure: PATCH ANGIOPLASTY LEFT CAROTID ARTERY USING Livia Snellen BIOLOGIC PATCH;  Surgeon: Maeola Harman, MD;  Location: [facility] OR;  Service: Vascular;  Laterality: Left;   TONSILLECTOMY     TOTAL KNEE ARTHROPLASTY Right 10/23/2016   Procedure: RIGHT TOTAL KNEE ARTHROPLASTY;  Surgeon: Nadara Mustard, MD;  Location: [facility] OR;  Service: Orthopedics;  Laterality: Right;   Patient Active Problem List   Diagnosis Date Noted   Upper respiratory infection 06/19/2023   Cervical spondylosis 06/10/2023   Hematoma of left knee region 06/03/2023   Right shoulder pain 05/01/2023   Cellulitis of left lower extremity 02/15/2023   Sinus pause 02/11/2023   LBBB (left bundle branch block) 02/11/2023   Knee pain 02/09/2023   Cellulitis 02/09/2023   Bursitis 02/08/2023   Normocytic anemia 02/08/2023   Thyroid  nodule greater than or equal to 1.5 cm in diameter incidentally noted on imaging study 09/03/2022   Myofascial neck pain 09/03/2022   Lumbar radiculopathy 05/08/2022   Hyperlipidemia 05/08/2022   Cellulitis of right forearm 01/22/2022   Sepsis secondary to UTI (HCC) 12/26/2021   Acute pyelonephritis 12/25/2021   Acute metabolic encephalopathy 12/25/2021   Sepsis (HCC) 12/25/2021   AKI (acute kidney injury) (HCC) 12/25/2021   Hyponatremia 12/25/2021   Dehydration 12/25/2021   Atrial fibrillation, chronic (HCC) 12/25/2021   Essential hypertension 12/25/2021   GERD  (gastroesophageal reflux disease) 12/25/2021   Acquired trigger finger of left middle finger 08/04/2020   Arthritis of hand 08/04/2020   Osteoarthritis of metacarpophalangeal (MCP) joint 08/03/2020   Bilateral hand pain 06/22/2020   Osteoarthritis of hands, 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   Osteoarthritis of feet, 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   PAF (paroxysmal atrial fibrillation) (HCC)    Chronic cough 12/29/2017   Atrial fibrillation with rapid ventricular response (HCC) 12/23/2017   Acute on chronic combined systolic and diastolic CHF (congestive heart failure) (HCC)    Menopausal symptom 10/17/2017   Menopausal syndrome 10/17/2017   Carotid stenosis 03/27/2017   S/P total knee arthroplasty, right 10/23/2016   Unilateral primary osteoarthritis, right knee 07/13/2016    PCP: [name], FNP REFERRING PROVIDER: Willia Craze, MD  ONSET DATE: ~1 year  REFERRING DIAG: M25.511 (ICD-10-CM) - Acute pain of right shoulder   THERAPY DIAG:  Acute pain of right shoulder  Shoulder stiffness, right  Other symptoms and signs involving the musculoskeletal system  Rationale for Evaluation and Treatment: Rehabilitation  SUBJECTIVE:   SUBJECTIVE STATEMENT: S: "I'm doing better"  PERTINENT HISTORY: Pt has had on going R shoulder pain for approximately 1 year, with multiple MD visits and steroid injections. Most recently seeing spine surgeon who determined severe osteoarthritis with x-ray. No recent MRI's.   PRECAUTIONS: None  WEIGHT BEARING RESTRICTIONS: No  PAIN:  Are you having pain? Yes: NPRS scale: 2/10 Pain location: trapezius and posterior shoulder girdle Pain description: sore Aggravating factors: certain movements Relieving factors: heating pad  FALLS: Has patient fallen in last 6 months? No  PLOF: Independent  PATIENT GOALS: I would like to be free of some of the pain and more mobile.   NEXT MD VISIT: None  OBJECTIVE:   HAND DOMINANCE: Right  ADLs: Overall ADLs: Pt has  mod to max difficulty with cleaning due to pain and weakness, as well as mod difficulty with cooking due to limited lifting ability. Pt having increased difficulty sleeping, waking multiple times a night due to pain.   FUNCTIONAL OUTCOME MEASURES: Upper Extremity Functional Scale (UEFS): 30/80 - 37.5%  UPPER EXTREMITY ROM:       Assessed in seated, er/IR adducted  Active ROM Right eval  Shoulder flexion 141  Shoulder abduction 129  Shoulder internal rotation 90  Shoulder external rotation 47  (Blank rows = not tested)    UPPER EXTREMITY MMT:     Assessed in seated, er/IR adducted  MMT Right eval  Shoulder flexion 4/5 *w/ pain  Shoulder abduction 4/5 *w/ pain  Shoulder internal rotation 4+/5  Shoulder external rotation 4/5  (Blank rows = not tested)  SENSATION: Pt reports numbness/tingling in the upper arm, right distal to the shoulder girdle.   EDEMA: No swelling noted  OBSERVATIONS: moderate fascial restrictions along the trapezius, scapular region, biceps, and deltoid.   TODAY'S TREATMENT:  DATE:  09/18/23 -Manual therapy: myofascial release and trigger point applied to biceps, trapezius, and scapular region in order to reduce pain and fascial restrictions, as well as improve ROM -P/ROM: supine, flexion, abduction, er/IR, 5 reps -A/ROM: supine-protraction, flexion, abduction, horizontal abduction, er, 10 reps -Proximal shoulder strengthening: supine-paddles, criss cross, circles each direction, 10 reps -A/ROM: sitting-protraction, flexion, abduction, horizontal abduction, er, 10 reps -Scapular theraband: red-row, extension, retraction, 10 reps -UBE: level 1, 3' forward 3' reverse, pace: 5.5  09/16/23 -Manual therapy: myofascial release and trigger point applied to biceps, trapezius, and scapular region in order to reduce pain and fascial  restrictions, as well as improve ROM -P/ROM: supine, flexion, abduction, er/IR, 5 reps -A/ROM: supine-protraction, flexion, abduction, horizontal abduction, er, 10 reps -A/ROM: sitting-protraction, flexion, abduction, horizontal abduction, er, 10 reps -Wall wash: 1' flexion -Functional Reaching: placing 10 cones on middle shelf of overhead cabinet in flexion, removing in abduction  09/10/23 -Manual therapy: myofascial release and trigger point applied to biceps, trapezius, and scapular region in order to reduce pain and fascial restrictions, as well as improve ROM -P/ROM: supine, flexion, abduction, er/IR, x10  -AA/ROM: supine, flexion, abduction, protraction, horizontal abduction, er/IR, x10 -Wall Washes: flexion, abduction, x45" each    PATIENT EDUCATION: Education details: reviewed HEP Person educated: Patient Education method: Explanation, Demonstration, and Handouts Education comprehension: verbalized understanding and returned demonstration  HOME EXERCISE PROGRAM: 3/14: Table Slides and wall slides 3/25: A/ROM  GOALS: Goals reviewed with patient? Yes   SHORT TERM GOALS: Target date: 10/10/23  Pt will be provided with and educated on HEP to improve mobility in RUE required for use during ADL completion.   Goal status: IN PROGRESS  LONG TERM GOALS: Target date: 10/10/23  Pt will decrease pain in RUE to 3/10 or less to improve ability to sleep for 2+ consecutive hours without waking due to pain.   Goal status: IN PROGRESS  2.  Pt will decrease RUE fascial restrictions to min amounts or less to improve mobility required for functional reaching tasks.   Goal status: IN PROGRESS  3.  Pt will increase RUE A/ROM by 15+ degrees to improve ability to use RUE when reaching overhead or behind back during dressing and bathing tasks.   Goal status: IN PROGRESS  4.  Pt will increase RUE strength to 5/5 or greater to improve ability to use RUE when lifting or carrying items during  meal preparation/housework/yardwork tasks.   Goal status: IN PROGRESS  5.  Pt will return to highest level of function using RUE as dominant during functional task completion.   Goal status: IN PROGRESS   ASSESSMENT:  CLINICAL IMPRESSION: Pt reports her shoulder is feeling better, HEP is going well. Continued with manual techniques, good vasomotor response in scapular trigger point. P/ROM is full, good joint mobility. Continued with A/ROM with full ROM demonstrated. Pt with mild soreness during seated ROM exercises, at times LUE more than RUE. Added scapular theraband and UBE today, visual and verbal cuing for form and technique. CGA during standing theraband tasks. Mod fatigue with UBE.   PERFORMANCE DEFICITS: in functional skills including in functional skills including ADLs, IADLs, coordination, tone, ROM, strength, pain, fascial restrictions, muscle spasms, and UE functional use.   PLAN:  OT FREQUENCY: 2x/week  OT DURATION: 4 weeks  PLANNED INTERVENTIONS: 97168 OT Re-evaluation, 97535 self care/ADL training, 64403 therapeutic exercise, 97530 therapeutic activity, 97140 manual therapy, 97035 ultrasound, 97010 moist heat, 97032 electrical stimulation (manual), passive range of motion, energy conservation, coping strategies training,  patient/family education, and DME and/or AE instructions  CONSULTED AND AGREED WITH PLAN OF CARE: Patient  PLAN FOR NEXT SESSION: Manual Therapy, A/ROM, functional reaching, scapular strengthening and stability, overhead lacing   Ezra Sites, OTR/L  [phone] 09/18/2023, 2:11 PM

## 2023-09-20 ENCOUNTER — Other Ambulatory Visit: Payer: Self-pay | Admitting: Cardiology

## 2023-09-23 ENCOUNTER — Encounter (HOSPITAL_COMMUNITY): Admitting: Occupational Therapy

## 2023-09-23 ENCOUNTER — Other Ambulatory Visit: Payer: Self-pay | Admitting: Cardiology

## 2023-09-25 ENCOUNTER — Encounter (HOSPITAL_COMMUNITY): Admitting: Occupational Therapy

## 2023-09-30 ENCOUNTER — Ambulatory Visit (HOSPITAL_COMMUNITY): Attending: Orthopedic Surgery | Admitting: Occupational Therapy

## 2023-09-30 ENCOUNTER — Encounter (HOSPITAL_COMMUNITY): Payer: Self-pay | Admitting: Occupational Therapy

## 2023-09-30 DIAGNOSIS — M25511 Pain in right shoulder: Secondary | ICD-10-CM | POA: Diagnosis not present

## 2023-09-30 DIAGNOSIS — R29898 Other symptoms and signs involving the musculoskeletal system: Secondary | ICD-10-CM | POA: Diagnosis not present

## 2023-09-30 DIAGNOSIS — M25611 Stiffness of right shoulder, not elsewhere classified: Secondary | ICD-10-CM | POA: Insufficient documentation

## 2023-09-30 NOTE — Therapy (Signed)
 OUTPATIENT OCCUPATIONAL THERAPY ORTHO TREATMENT NOTE  Patient Name: Lydia Graham MRN: [id number] DOB:[birth year], 88 y.o., female Today's Date: 09/30/2023   END OF SESSION:  OT End of Session - 09/30/23 1016     Visit Number 5    Number of Visits 8    Date for OT Re-Evaluation 10/10/23    Authorization Type Medicare Part A/B, [employer] [place]    OT Start Time 0930    OT Stop Time 1012    OT Time Calculation (min) 42 min    Activity Tolerance Patient tolerated treatment well    Behavior During Therapy WFL for tasks assessed/performed                Past Medical History:  Diagnosis Date   Arthritis    oa, all over - multiple areas    Carotid artery occlusion    Dysrhythmia    AFib    GERD (gastroesophageal reflux disease)    Headache    h/o migraines    Hypertension    PAF (paroxysmal atrial fibrillation) (HCC)    a. multiple DCCV's in 2019 and eventually required initiation of Amiodarone with successful DCCV after this. b. recurrent in 12/2021 while admitted for Urosepsis   Past Surgical History:  Procedure Laterality Date   ABDOMINAL HYSTERECTOMY     APPENDECTOMY     BACK SURGERY     CARDIOVERSION N/A 12/19/2017   Procedure: CARDIOVERSION;  Surgeon: Antoine Poche, MD;  Location: [facility];  Service: Endoscopy;  Laterality: N/A;   CARDIOVERSION N/A 12/26/2017   Procedure: CARDIOVERSION;  Surgeon: Chrystie Nose, MD;  Location: [facility] ENDOSCOPY;  Service: Cardiovascular;  Laterality: N/A;   CARDIOVERSION N/A 01/26/2018   Procedure: CARDIOVERSION;  Surgeon: Jake Bathe, MD;  Location: [facility] ENDOSCOPY;  Service: Cardiovascular;  Laterality: N/A;   CAROTID ENDARTERECTOMY     CATARACT EXTRACTION W/PHACO Right 03/09/2021   Procedure: CATARACT EXTRACTION PHACO AND INTRAOCULAR LENS PLACEMENT with Placement of Corticosteroid (IOC);  Surgeon: Fabio Pierce, MD;  Location: [facility] ORS;  Service: Ophthalmology;  Laterality: Right;  CDE 9.16   CATARACT EXTRACTION W/PHACO Left  04/02/2021   Procedure: CATARACT EXTRACTION PHACO AND INTRAOCULAR LENS PLACEMENT LEFT EYE;  Surgeon: Fabio Pierce, MD;  Location: [facility] ORS;  Service: Ophthalmology;  Laterality: Left;  left CDE=6.75   ENDARTERECTOMY Left 03/27/2017   Procedure: ENDARTERECTOMY CAROTID LEFT;  Surgeon: Maeola Harman, MD;  Location: [facility] OR;  Service: Vascular;  Laterality: Left;   INNER EAR SURGERY Right    puntured eardrum- repaired 2x's    KNEE ARTHROPLASTY     NASAL SINUS SURGERY     deviated septum   PATCH ANGIOPLASTY Left 03/27/2017   Procedure: PATCH ANGIOPLASTY LEFT CAROTID ARTERY USING Livia Snellen BIOLOGIC PATCH;  Surgeon: Maeola Harman, MD;  Location: [facility] OR;  Service: Vascular;  Laterality: Left;   TONSILLECTOMY     TOTAL KNEE ARTHROPLASTY Right 10/23/2016   Procedure: RIGHT TOTAL KNEE ARTHROPLASTY;  Surgeon: Nadara Mustard, MD;  Location: [facility] OR;  Service: Orthopedics;  Laterality: Right;   Patient Active Problem List   Diagnosis Date Noted   Upper respiratory infection 06/19/2023   Cervical spondylosis 06/10/2023   Hematoma of left knee region 06/03/2023   Right shoulder pain 05/01/2023   Cellulitis of left lower extremity 02/15/2023   Sinus pause 02/11/2023   LBBB (left bundle branch block) 02/11/2023   Knee pain 02/09/2023   Cellulitis 02/09/2023   Bursitis 02/08/2023   Normocytic anemia 02/08/2023  Thyroid nodule greater than or equal to 1.5 cm in diameter incidentally noted on imaging study 09/03/2022   Myofascial neck pain 09/03/2022   Lumbar radiculopathy 05/08/2022   Hyperlipidemia 05/08/2022   Cellulitis of right forearm 01/22/2022   Sepsis secondary to UTI (HCC) 12/26/2021   Acute pyelonephritis 12/25/2021   Acute metabolic encephalopathy 12/25/2021   Sepsis (HCC) 12/25/2021   AKI (acute kidney injury) (HCC) 12/25/2021   Hyponatremia 12/25/2021   Dehydration 12/25/2021   Atrial fibrillation, chronic (HCC) 12/25/2021   Essential hypertension 12/25/2021   GERD  (gastroesophageal reflux disease) 12/25/2021   Acquired trigger finger of left middle finger 08/04/2020   Arthritis of hand 08/04/2020   Osteoarthritis of metacarpophalangeal (MCP) joint 08/03/2020   Bilateral hand pain 06/22/2020   Osteoarthritis of hands, 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   Osteoarthritis of feet, 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   PAF (paroxysmal atrial fibrillation) (HCC)    Chronic cough 12/29/2017   Atrial fibrillation with rapid ventricular response (HCC) 12/23/2017   Acute on chronic combined systolic and diastolic CHF (congestive heart failure) (HCC)    Menopausal symptom 10/17/2017   Menopausal syndrome 10/17/2017   Carotid stenosis 03/27/2017   S/P total knee arthroplasty, right 10/23/2016   Unilateral primary osteoarthritis, right knee 07/13/2016    PCP: [name], FNP REFERRING PROVIDER: Willia Craze, MD  ONSET DATE: ~1 year  REFERRING DIAG: M25.511 (ICD-10-CM) - Acute pain of right shoulder   THERAPY DIAG:  Acute pain of right shoulder  Shoulder stiffness, right  Other symptoms and signs involving the musculoskeletal system  Rationale for Evaluation and Treatment: Rehabilitation  SUBJECTIVE:   SUBJECTIVE STATEMENT: S: "We did something last time that my arm did not like."  PERTINENT HISTORY: Pt has had on going R shoulder pain for approximately 1 year, with multiple MD visits and steroid injections. Most recently seeing spine surgeon who determined severe osteoarthritis with x-ray. No recent MRI's.   PRECAUTIONS: None  WEIGHT BEARING RESTRICTIONS: No  PAIN:  Are you having pain? Yes: NPRS scale: 2/10 Pain location: trapezius and posterior shoulder girdle Pain description: sore Aggravating factors: certain movements Relieving factors: heating pad  FALLS: Has patient fallen in last 6 months? No  PLOF: Independent  PATIENT GOALS: I would like to be free of some of the pain and more mobile.   NEXT MD VISIT: None  OBJECTIVE:   HAND DOMINANCE:  Right  ADLs: Overall ADLs: Pt has mod to max difficulty with cleaning due to pain and weakness, as well as mod difficulty with cooking due to limited lifting ability. Pt having increased difficulty sleeping, waking multiple times a night due to pain.   FUNCTIONAL OUTCOME MEASURES: Upper Extremity Functional Scale (UEFS): 30/80 - 37.5%  UPPER EXTREMITY ROM:       Assessed in seated, er/IR adducted  Active ROM Right eval  Shoulder flexion 141  Shoulder abduction 129  Shoulder internal rotation 90  Shoulder external rotation 47  (Blank rows = not tested)    UPPER EXTREMITY MMT:     Assessed in seated, er/IR adducted  MMT Right eval  Shoulder flexion 4/5 *w/ pain  Shoulder abduction 4/5 *w/ pain  Shoulder internal rotation 4+/5  Shoulder external rotation 4/5  (Blank rows = not tested)  SENSATION: Pt reports numbness/tingling in the upper arm, right distal to the shoulder girdle.   EDEMA: No swelling noted  OBSERVATIONS: moderate fascial restrictions along the trapezius, scapular region, biceps, and deltoid.   TODAY'S TREATMENT:  DATE:  09/30/23 -Manual therapy: myofascial release and trigger point applied to biceps, trapezius, and scapular region in order to reduce pain and fascial restrictions, as well as improve ROM -P/ROM: supine, flexion, abduction, er/IR, 5 reps -A/ROM: supine-protraction, flexion, abduction, horizontal abduction, er, 10 reps -Proximal shoulder strengthening: supine-paddles, criss cross, circles each direction, 10 reps -Functional reaching: standing at countertop, placing and removing 5 cones from top shelf in flexion, 5 cones to and from middle shelf in abduction -A/ROM: sitting-protraction, flexion, abduction, horizontal abduction, er, 10 reps -Overhead lacing: seated-lacing from top down then reversing -UBE: level 1, 2'  forward, pace 4.0  09/18/23 -Manual therapy: myofascial release and trigger point applied to biceps, trapezius, and scapular region in order to reduce pain and fascial restrictions, as well as improve ROM -P/ROM: supine, flexion, abduction, er/IR, 5 reps -A/ROM: supine-protraction, flexion, abduction, horizontal abduction, er, 10 reps -Proximal shoulder strengthening: supine-paddles, criss cross, circles each direction, 10 reps -A/ROM: sitting-protraction, flexion, abduction, horizontal abduction, er, 10 reps -Scapular theraband: red-row, extension, retraction, 10 reps -UBE: level 1, 3' forward 3' reverse, pace: 5.5  09/16/23 -Manual therapy: myofascial release and trigger point applied to biceps, trapezius, and scapular region in order to reduce pain and fascial restrictions, as well as improve ROM -P/ROM: supine, flexion, abduction, er/IR, 5 reps -A/ROM: supine-protraction, flexion, abduction, horizontal abduction, er, 10 reps -A/ROM: sitting-protraction, flexion, abduction, horizontal abduction, er, 10 reps -Wall wash: 1' flexion -Functional Reaching: placing 10 cones on middle shelf of overhead cabinet in flexion, removing in abduction     PATIENT EDUCATION: Education details: reviewed HEP Person educated: Patient Education method: Explanation, Demonstration, and Handouts Education comprehension: verbalized understanding and returned demonstration  HOME EXERCISE PROGRAM: 3/14: Table Slides and wall slides 3/25: A/ROM  GOALS: Goals reviewed with patient? Yes   SHORT TERM GOALS: Target date: 10/10/23  Pt will be provided with and educated on HEP to improve mobility in RUE required for use during ADL completion.   Goal status: IN PROGRESS  LONG TERM GOALS: Target date: 10/10/23  Pt will decrease pain in RUE to 3/10 or less to improve ability to sleep for 2+ consecutive hours without waking due to pain.   Goal status: IN PROGRESS  2.  Pt will decrease RUE fascial  restrictions to min amounts or less to improve mobility required for functional reaching tasks.   Goal status: IN PROGRESS  3.  Pt will increase RUE A/ROM by 15+ degrees to improve ability to use RUE when reaching overhead or behind back during dressing and bathing tasks.   Goal status: IN PROGRESS  4.  Pt will increase RUE strength to 5/5 or greater to improve ability to use RUE when lifting or carrying items during meal preparation/housework/yardwork tasks.   Goal status: IN PROGRESS  5.  Pt will return to highest level of function using RUE as dominant during functional task completion.   Goal status: IN PROGRESS   ASSESSMENT:  CLINICAL IMPRESSION: Pt reports she was very sore along her trapezius after previous session, suspect scapular theraband was a little intense. Pt with fascial restrictions along trapezius and upper arm, manual techniques completed to address. Pt continues to have good ROM with passive stretching and A/ROM. Added overhead lacing and resumed functional reaching today. Completed short session of UBE, no theraband today. Verbal cuing for form and technique.   PERFORMANCE DEFICITS: in functional skills including in functional skills including ADLs, IADLs, coordination, tone, ROM, strength, pain, fascial restrictions, muscle spasms, and UE functional use.  PLAN:  OT FREQUENCY: 2x/week  OT DURATION: 4 weeks  PLANNED INTERVENTIONS: 97168 OT Re-evaluation, 97535 self care/ADL training, 30865 therapeutic exercise, 97530 therapeutic activity, 97140 manual therapy, 97035 ultrasound, 97010 moist heat, 97032 electrical stimulation (manual), passive range of motion, energy conservation, coping strategies training, patient/family education, and DME and/or AE instructions  CONSULTED AND AGREED WITH PLAN OF CARE: Patient  PLAN FOR NEXT SESSION: Manual Therapy, A/ROM, functional reaching, scapular strengthening and stability, overhead lacing   Ezra Sites, OTR/L   [phone] 09/30/2023, 10:17 AM

## 2023-10-02 ENCOUNTER — Ambulatory Visit (HOSPITAL_COMMUNITY): Admitting: Occupational Therapy

## 2023-10-02 ENCOUNTER — Encounter (HOSPITAL_COMMUNITY): Payer: Self-pay | Admitting: Occupational Therapy

## 2023-10-02 DIAGNOSIS — M25611 Stiffness of right shoulder, not elsewhere classified: Secondary | ICD-10-CM

## 2023-10-02 DIAGNOSIS — R29898 Other symptoms and signs involving the musculoskeletal system: Secondary | ICD-10-CM

## 2023-10-02 DIAGNOSIS — M25511 Pain in right shoulder: Secondary | ICD-10-CM | POA: Diagnosis not present

## 2023-10-02 NOTE — Therapy (Signed)
 OUTPATIENT OCCUPATIONAL THERAPY ORTHO TREATMENT NOTE  Patient Name: Lydia Graham MRN: [id number] DOB:09-Sep-1935, 88 y.o., female Today's Date: 10/02/2023   END OF SESSION:  OT End of Session - 10/02/23 1017     Visit Number 6    Number of Visits 8    Date for OT Re-Evaluation 10/10/23    Authorization Type Medicare Part A/B, [employer] [place]    OT Start Time [phone]    OT Stop Time 1016    OT Time Calculation (min) 39 min    Activity Tolerance Patient tolerated treatment well    Behavior During Therapy WFL for tasks assessed/performed                 Past Medical History:  Diagnosis Date   Arthritis    oa, all over - multiple areas    Carotid artery occlusion    Dysrhythmia    AFib    GERD (gastroesophageal reflux disease)    Headache    h/o migraines    Hypertension    PAF (paroxysmal atrial fibrillation) (HCC)    a. multiple DCCV's in 2019 and eventually required initiation of Amiodarone with successful DCCV after this. b. recurrent in 12/2021 while admitted for Urosepsis   Past Surgical History:  Procedure Laterality Date   ABDOMINAL HYSTERECTOMY     APPENDECTOMY     BACK SURGERY     CARDIOVERSION N/A 12/19/2017   Procedure: CARDIOVERSION;  Surgeon: Antoine Poche, MD;  Location: [facility];  Service: Endoscopy;  Laterality: N/A;   CARDIOVERSION N/A 12/26/2017   Procedure: CARDIOVERSION;  Surgeon: Chrystie Nose, MD;  Location: [facility] ENDOSCOPY;  Service: Cardiovascular;  Laterality: N/A;   CARDIOVERSION N/A 01/26/2018   Procedure: CARDIOVERSION;  Surgeon: Jake Bathe, MD;  Location: [facility] ENDOSCOPY;  Service: Cardiovascular;  Laterality: N/A;   CAROTID ENDARTERECTOMY     CATARACT EXTRACTION W/PHACO Right 03/09/2021   Procedure: CATARACT EXTRACTION PHACO AND INTRAOCULAR LENS PLACEMENT with Placement of Corticosteroid (IOC);  Surgeon: Fabio Pierce, MD;  Location: [facility] ORS;  Service: Ophthalmology;  Laterality: Right;  CDE 9.16   CATARACT EXTRACTION W/PHACO  Left 04/02/2021   Procedure: CATARACT EXTRACTION PHACO AND INTRAOCULAR LENS PLACEMENT LEFT EYE;  Surgeon: Fabio Pierce, MD;  Location: [facility] ORS;  Service: Ophthalmology;  Laterality: Left;  left CDE=6.75   ENDARTERECTOMY Left 03/27/2017   Procedure: ENDARTERECTOMY CAROTID LEFT;  Surgeon: Maeola Harman, MD;  Location: [facility] OR;  Service: Vascular;  Laterality: Left;   INNER EAR SURGERY Right    puntured eardrum- repaired 2x's    KNEE ARTHROPLASTY     NASAL SINUS SURGERY     deviated septum   PATCH ANGIOPLASTY Left 03/27/2017   Procedure: PATCH ANGIOPLASTY LEFT CAROTID ARTERY USING Livia Snellen BIOLOGIC PATCH;  Surgeon: Maeola Harman, MD;  Location: [facility] OR;  Service: Vascular;  Laterality: Left;   TONSILLECTOMY     TOTAL KNEE ARTHROPLASTY Right 10/23/2016   Procedure: RIGHT TOTAL KNEE ARTHROPLASTY;  Surgeon: Nadara Mustard, MD;  Location: [facility] OR;  Service: Orthopedics;  Laterality: Right;   Patient Active Problem List   Diagnosis Date Noted   Upper respiratory infection 06/19/2023   Cervical spondylosis 06/10/2023   Hematoma of left knee region 06/03/2023   Right shoulder pain 05/01/2023   Cellulitis of left lower extremity 02/15/2023   Sinus pause 02/11/2023   LBBB (left bundle branch block) 02/11/2023   Knee pain 02/09/2023   Cellulitis 02/09/2023   Bursitis 02/08/2023   Normocytic anemia 02/08/2023  Thyroid nodule greater than or equal to 1.5 cm in diameter incidentally noted on imaging study 09/03/2022   Myofascial neck pain 09/03/2022   Lumbar radiculopathy 05/08/2022   Hyperlipidemia 05/08/2022   Cellulitis of right forearm 01/22/2022   Sepsis secondary to UTI (HCC) 12/26/2021   Acute pyelonephritis 12/25/2021   Acute metabolic encephalopathy 12/25/2021   Sepsis (HCC) 12/25/2021   AKI (acute kidney injury) (HCC) 12/25/2021   Hyponatremia 12/25/2021   Dehydration 12/25/2021   Atrial fibrillation, chronic (HCC) 12/25/2021   Essential hypertension 12/25/2021    GERD (gastroesophageal reflux disease) 12/25/2021   Acquired trigger finger of left middle finger 08/04/2020   Arthritis of hand 08/04/2020   Osteoarthritis of metacarpophalangeal (MCP) joint 08/03/2020   Bilateral hand pain 06/22/2020   Osteoarthritis of hands, 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   Osteoarthritis of feet, 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   PAF (paroxysmal atrial fibrillation) (HCC)    Chronic cough 12/29/2017   Atrial fibrillation with rapid ventricular response (HCC) 12/23/2017   Acute on chronic combined systolic and diastolic CHF (congestive heart failure) (HCC)    Menopausal symptom 10/17/2017   Menopausal syndrome 10/17/2017   Carotid stenosis 03/27/2017   S/P total knee arthroplasty, right 10/23/2016   Unilateral primary osteoarthritis, right knee 07/13/2016    PCP: [name], FNP REFERRING PROVIDER: Willia Craze, MD  ONSET DATE: ~1 year  REFERRING DIAG: M25.511 (ICD-10-CM) - Acute pain of right shoulder   THERAPY DIAG:  Acute pain of right shoulder  Shoulder stiffness, right  Other symptoms and signs involving the musculoskeletal system  Rationale for Evaluation and Treatment: Rehabilitation  SUBJECTIVE:   SUBJECTIVE STATEMENT: S: "It's better today"  PERTINENT HISTORY: Pt has had on going R shoulder pain for approximately 1 year, with multiple MD visits and steroid injections. Most recently seeing spine surgeon who determined severe osteoarthritis with x-ray. No recent MRI's.   PRECAUTIONS: None  WEIGHT BEARING RESTRICTIONS: No  PAIN:  Are you having pain? No  FALLS: Has patient fallen in last 6 months? No  PLOF: Independent  PATIENT GOALS: I would like to be free of some of the pain and more mobile.   NEXT MD VISIT: None  OBJECTIVE:   HAND DOMINANCE: Right  ADLs: Overall ADLs: Pt has mod to max difficulty with cleaning due to pain and weakness, as well as mod difficulty with cooking due to limited lifting ability. Pt having increased difficulty  sleeping, waking multiple times a night due to pain.   FUNCTIONAL OUTCOME MEASURES: Upper Extremity Functional Scale (UEFS): 30/80 - 37.5%  UPPER EXTREMITY ROM:       Assessed in seated, er/IR adducted  Active ROM Right eval  Shoulder flexion 141  Shoulder abduction 129  Shoulder internal rotation 90  Shoulder external rotation 47  (Blank rows = not tested)    UPPER EXTREMITY MMT:     Assessed in seated, er/IR adducted  MMT Right eval  Shoulder flexion 4/5 *w/ pain  Shoulder abduction 4/5 *w/ pain  Shoulder internal rotation 4+/5  Shoulder external rotation 4/5  (Blank rows = not tested)  SENSATION: Pt reports numbness/tingling in the upper arm, right distal to the shoulder girdle.   EDEMA: No swelling noted  OBSERVATIONS: moderate fascial restrictions along the trapezius, scapular region, biceps, and deltoid.   TODAY'S TREATMENT:  DATE:  10/02/23 -Manual therapy: myofascial release and trigger point applied to biceps, trapezius, and scapular region in order to reduce pain and fascial restrictions, as well as improve ROM -P/ROM: supine, flexion, abduction, er/IR, 5 reps -A/ROM: supine-protraction, flexion, abduction, horizontal abduction, er, 12 reps -Proximal shoulder strengthening: supine-paddles, criss cross, circles each direction, 10 reps -A/ROM: sitting-protraction, flexion, abduction, horizontal abduction, er, 10 reps -X to V arms: 10 reps -Scapular theraband: yellow-row, extension, 10 reps -UBE: level 1, 3' forward, 1' reverse, pace 4.0  09/30/23 -Manual therapy: myofascial release and trigger point applied to biceps, trapezius, and scapular region in order to reduce pain and fascial restrictions, as well as improve ROM -P/ROM: supine, flexion, abduction, er/IR, 5 reps -A/ROM: supine-protraction, flexion, abduction, horizontal  abduction, er, 10 reps -Proximal shoulder strengthening: supine-paddles, criss cross, circles each direction, 10 reps -Functional reaching: standing at countertop, placing and removing 5 cones from top shelf in flexion, 5 cones to and from middle shelf in abduction -A/ROM: sitting-protraction, flexion, abduction, horizontal abduction, er, 10 reps -Overhead lacing: seated-lacing from top down then reversing -UBE: level 1, 2' forward, pace 4.0  09/18/23 -Manual therapy: myofascial release and trigger point applied to biceps, trapezius, and scapular region in order to reduce pain and fascial restrictions, as well as improve ROM -P/ROM: supine, flexion, abduction, er/IR, 5 reps -A/ROM: supine-protraction, flexion, abduction, horizontal abduction, er, 10 reps -Proximal shoulder strengthening: supine-paddles, criss cross, circles each direction, 10 reps -A/ROM: sitting-protraction, flexion, abduction, horizontal abduction, er, 10 reps -Scapular theraband: red-row, extension, retraction, 10 reps -UBE: level 1, 3' forward 3' reverse, pace: 5.5    PATIENT EDUCATION: Education details: reviewed HEP Person educated: Patient Education method: Explanation, Demonstration, and Handouts Education comprehension: verbalized understanding and returned demonstration  HOME EXERCISE PROGRAM: 3/14: Table Slides and wall slides 3/25: A/ROM  GOALS: Goals reviewed with patient? Yes   SHORT TERM GOALS: Target date: 10/10/23  Pt will be provided with and educated on HEP to improve mobility in RUE required for use during ADL completion.   Goal status: IN PROGRESS  LONG TERM GOALS: Target date: 10/10/23  Pt will decrease pain in RUE to 3/10 or less to improve ability to sleep for 2+ consecutive hours without waking due to pain.   Goal status: IN PROGRESS  2.  Pt will decrease RUE fascial restrictions to min amounts or less to improve mobility required for functional reaching tasks.   Goal status: IN  PROGRESS  3.  Pt will increase RUE A/ROM by 15+ degrees to improve ability to use RUE when reaching overhead or behind back during dressing and bathing tasks.   Goal status: IN PROGRESS  4.  Pt will increase RUE strength to 5/5 or greater to improve ability to use RUE when lifting or carrying items during meal preparation/housework/yardwork tasks.   Goal status: IN PROGRESS  5.  Pt will return to highest level of function using RUE as dominant during functional task completion.   Goal status: IN PROGRESS   ASSESSMENT:  CLINICAL IMPRESSION: Pt reports improvement in pain since last session, no pain on arrival today. HEP is going well. Continued with manual therapy, still tender with trigger point in trapezius but less tenderness than in previous session. Pt with full passive and A/ROM. Added x to v arms and resumed scapular theraband for row and extension, but downgraded to yellow band versus red for greater success with less pain. Focus on posture and form during session. Verbal cuing for form and technique, tactile cuing for  safety during standing scapular theraband activity.   PERFORMANCE DEFICITS: in functional skills including in functional skills including ADLs, IADLs, coordination, tone, ROM, strength, pain, fascial restrictions, muscle spasms, and UE functional use.   PLAN:  OT FREQUENCY: 2x/week  OT DURATION: 4 weeks  PLANNED INTERVENTIONS: 97168 OT Re-evaluation, 97535 self care/ADL training, 69629 therapeutic exercise, 97530 therapeutic activity, 97140 manual therapy, 97035 ultrasound, 97010 moist heat, 97032 electrical stimulation (manual), passive range of motion, energy conservation, coping strategies training, patient/family education, and DME and/or AE instructions  CONSULTED AND AGREED WITH PLAN OF CARE: Patient  PLAN FOR NEXT SESSION: reassessment, determine if needs recert or ready for discharge with HEP   Ezra Sites, OTR/L  [phone] 10/02/2023, 10:18  AM

## 2023-10-13 ENCOUNTER — Other Ambulatory Visit: Payer: Self-pay | Admitting: Cardiology

## 2023-10-13 NOTE — Telephone Encounter (Signed)
 Prescription refill request for Eliquis  received. Indication:afib Last office visit:2/25 Scr:1.01  2/25 Age: 88 Weight:66  kg  Prescription refilled

## 2023-10-17 ENCOUNTER — Encounter (HOSPITAL_COMMUNITY): Payer: Self-pay | Admitting: Occupational Therapy

## 2023-10-17 ENCOUNTER — Ambulatory Visit (HOSPITAL_COMMUNITY): Admitting: Occupational Therapy

## 2023-10-17 DIAGNOSIS — M25611 Stiffness of right shoulder, not elsewhere classified: Secondary | ICD-10-CM

## 2023-10-17 DIAGNOSIS — R29898 Other symptoms and signs involving the musculoskeletal system: Secondary | ICD-10-CM

## 2023-10-17 DIAGNOSIS — M25511 Pain in right shoulder: Secondary | ICD-10-CM

## 2023-10-17 NOTE — Therapy (Signed)
 OUTPATIENT OCCUPATIONAL THERAPY ORTHO TREATMENT NOTE REASSESSMENT AND DISCHARGE  Patient Name: Lydia  [name] MRN: [id number] DOB:08-08-1935, 88 y.o., female Today's Date: 10/17/2023   OCCUPATIONAL THERAPY DISCHARGE SUMMARY  Visits from Start of Care: 7  Current functional level related to goals / functional outcomes: See below. Pt is pleased with the current functional level and is agreeable to discharge with HEP   Remaining deficits: Occasional pain and weakness in the RUE   Education / Equipment: HEP   Patient agrees to discharge. Patient goals were partially met. Patient is being discharged due to being pleased with the current functional level..     END OF SESSION:  OT End of Session - 10/17/23 0956     Visit Number 7    Number of Visits 8    Date for OT Re-Evaluation 10/17/23    Authorization Type Medicare Part A/B, [employer] [place]    OT Start Time [phone]    OT Stop Time 1001    OT Time Calculation (min) 30 min    Activity Tolerance Patient tolerated treatment well    Behavior During Therapy WFL for tasks assessed/performed                  Past Medical History:  Diagnosis Date   Arthritis    oa, all over - multiple areas    Carotid artery occlusion    Dysrhythmia    AFib    GERD (gastroesophageal reflux disease)    Headache    h/o migraines    Hypertension    PAF (paroxysmal atrial fibrillation) (HCC)    a. multiple DCCV's in 2019 and eventually required initiation of Amiodarone  with successful DCCV after this. b. recurrent in 12/2021 while admitted for Urosepsis   Past Surgical History:  Procedure Laterality Date   ABDOMINAL HYSTERECTOMY     APPENDECTOMY     BACK SURGERY     CARDIOVERSION N/A 12/19/2017   Procedure: CARDIOVERSION;  Surgeon: Laurann Pollock, MD;  Location: [facility];  Service: Endoscopy;  Laterality: N/A;   CARDIOVERSION N/A 12/26/2017   Procedure: CARDIOVERSION;  Surgeon: Hazle Lites, MD;  Location: [facility] ENDOSCOPY;   Service: Cardiovascular;  Laterality: N/A;   CARDIOVERSION N/A 01/26/2018   Procedure: CARDIOVERSION;  Surgeon: Hugh Madura, MD;  Location: [facility] ENDOSCOPY;  Service: Cardiovascular;  Laterality: N/A;   CAROTID ENDARTERECTOMY     CATARACT EXTRACTION W/PHACO Right 03/09/2021   Procedure: CATARACT EXTRACTION PHACO AND INTRAOCULAR LENS PLACEMENT with Placement of Corticosteroid (IOC);  Surgeon: Tarri Farm, MD;  Location: [facility] ORS;  Service: Ophthalmology;  Laterality: Right;  CDE 9.16   CATARACT EXTRACTION W/PHACO Left 04/02/2021   Procedure: CATARACT EXTRACTION PHACO AND INTRAOCULAR LENS PLACEMENT LEFT EYE;  Surgeon: Tarri Farm, MD;  Location: [facility] ORS;  Service: Ophthalmology;  Laterality: Left;  left CDE=6.75   ENDARTERECTOMY Left 03/27/2017   Procedure: ENDARTERECTOMY CAROTID LEFT;  Surgeon: Adine Hoof, MD;  Location: [facility] OR;  Service: Vascular;  Laterality: Left;   INNER EAR SURGERY Right    puntured eardrum- repaired 2x's    KNEE ARTHROPLASTY     NASAL SINUS SURGERY     deviated septum   PATCH ANGIOPLASTY Left 03/27/2017   Procedure: PATCH ANGIOPLASTY LEFT CAROTID ARTERY USING Corinna Dickens BIOLOGIC PATCH;  Surgeon: Adine Hoof, MD;  Location: [facility] OR;  Service: Vascular;  Laterality: Left;   TONSILLECTOMY     TOTAL KNEE ARTHROPLASTY Right 10/23/2016   Procedure: RIGHT TOTAL KNEE ARTHROPLASTY;  Surgeon: Timothy Ford,  MD;  Location: [facility] OR;  Service: Orthopedics;  Laterality: Right;   Patient Active Problem List   Diagnosis Date Noted   Upper respiratory infection 06/19/2023   Cervical spondylosis 06/10/2023   Hematoma of left knee region 06/03/2023   Right shoulder pain 05/01/2023   Cellulitis of left lower extremity 02/15/2023   Sinus pause 02/11/2023   LBBB (left bundle branch block) 02/11/2023   Knee pain 02/09/2023   Cellulitis 02/09/2023   Bursitis 02/08/2023   Normocytic anemia 02/08/2023   Thyroid  nodule greater than or equal to 1.5 cm in diameter  incidentally noted on imaging study 09/03/2022   Myofascial neck pain 09/03/2022   Lumbar radiculopathy 05/08/2022   Hyperlipidemia 05/08/2022   Cellulitis of right forearm 01/22/2022   Sepsis secondary to UTI (HCC) 12/26/2021   Acute pyelonephritis 12/25/2021   Acute metabolic encephalopathy 12/25/2021   Sepsis (HCC) 12/25/2021   AKI (acute kidney injury) (HCC) 12/25/2021   Hyponatremia 12/25/2021   Dehydration 12/25/2021   Atrial fibrillation, chronic (HCC) 12/25/2021   Essential hypertension 12/25/2021   GERD (gastroesophageal reflux disease) 12/25/2021   Acquired trigger finger of left middle finger 08/04/2020   Arthritis of hand 08/04/2020   Osteoarthritis of metacarpophalangeal (MCP) joint 08/03/2020   Bilateral hand pain 06/22/2020   Osteoarthritis of hands, 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   Osteoarthritis of feet, 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   PAF (paroxysmal atrial fibrillation) (HCC)    Chronic cough 12/29/2017   Atrial fibrillation with rapid ventricular response (HCC) 12/23/2017   Acute on chronic combined systolic and diastolic CHF (congestive heart failure) (HCC)    Menopausal symptom 10/17/2017   Menopausal syndrome 10/17/2017   Carotid stenosis 03/27/2017   S/P total knee arthroplasty, right 10/23/2016   Unilateral primary osteoarthritis, right knee 07/13/2016    PCP: Graham, Gloria, FNP REFERRING PROVIDER: Colette Davies, MD  ONSET DATE: ~1 year  REFERRING DIAG: M25.511 (ICD-10-CM) - Acute pain of right shoulder   THERAPY DIAG:  Acute pain of right shoulder  Shoulder stiffness, right  Other symptoms and signs involving the musculoskeletal system  Rationale for Evaluation and Treatment: Rehabilitation  SUBJECTIVE:   SUBJECTIVE STATEMENT: S: "It's been doing pretty good."   PERTINENT HISTORY: Pt has had on going R shoulder pain for approximately 1 year, with multiple MD visits and steroid injections. Most recently seeing spine surgeon who determined severe  osteoarthritis with x-ray. No recent MRI's.   PRECAUTIONS: None  WEIGHT BEARING RESTRICTIONS: No  PAIN:  Are you having pain? No  FALLS: Has patient fallen in last 6 months? No  PLOF: Independent  PATIENT GOALS: I would like to be free of some of the pain and more mobile.   NEXT MD VISIT: None  OBJECTIVE:   HAND DOMINANCE: Right  ADLs: Overall ADLs: Pt has mod to max difficulty with cleaning due to pain and weakness, as well as mod difficulty with cooking due to limited lifting ability. Pt having increased difficulty sleeping, waking multiple times a night due to pain.   FUNCTIONAL OUTCOME MEASURES: Upper Extremity Functional Scale (UEFS): 30/80 - 37.5% 10/17/23: 74/80  UPPER EXTREMITY ROM:       Assessed in seated, er/IR adducted  Active ROM Right eval Right 10/17/23  Shoulder flexion 141 141  Shoulder abduction 129 145  Shoulder internal rotation 90 90  Shoulder external rotation 47 69  (Blank rows = not tested)    UPPER EXTREMITY MMT:     Assessed in seated, er/IR adducted  MMT Right eval Right 10/17/23  Shoulder flexion 4/5 *  w/ pain 4+/5  Shoulder abduction 4/5 *w/ pain 4/5  Shoulder internal rotation 4+/5 5/5  Shoulder external rotation 4/5 5/5  (Blank rows = not tested)  SENSATION: Pt reports numbness/tingling in the upper arm, right distal to the shoulder girdle.   EDEMA: No swelling noted  OBSERVATIONS: moderate fascial restrictions along the trapezius, scapular region, biceps, and deltoid.   TODAY'S TREATMENT:                                                                                                                              DATE:  10/17/23 -Manual therapy: myofascial release and trigger point applied to biceps, trapezius, and scapular region in order to reduce pain and fascial restrictions, as well as improve ROM -A/ROM: sitting-protraction, flexion, abduction, horizontal abduction, er, 10 reps  10/02/23 -Manual therapy: myofascial  release and trigger point applied to biceps, trapezius, and scapular region in order to reduce pain and fascial restrictions, as well as improve ROM -P/ROM: supine, flexion, abduction, er/IR, 5 reps -A/ROM: supine-protraction, flexion, abduction, horizontal abduction, er, 12 reps -Proximal shoulder strengthening: supine-paddles, criss cross, circles each direction, 10 reps -A/ROM: sitting-protraction, flexion, abduction, horizontal abduction, er, 10 reps -X to V arms: 10 reps -Scapular theraband: yellow-row, extension, 10 reps -UBE: level 1, 3' forward, 1' reverse, pace 4.0  09/30/23 -Manual therapy: myofascial release and trigger point applied to biceps, trapezius, and scapular region in order to reduce pain and fascial restrictions, as well as improve ROM -P/ROM: supine, flexion, abduction, er/IR, 5 reps -A/ROM: supine-protraction, flexion, abduction, horizontal abduction, er, 10 reps -Proximal shoulder strengthening: supine-paddles, criss cross, circles each direction, 10 reps -Functional reaching: standing at countertop, placing and removing 5 cones from top shelf in flexion, 5 cones to and from middle shelf in abduction -A/ROM: sitting-protraction, flexion, abduction, horizontal abduction, er, 10 reps -Overhead lacing: seated-lacing from top down then reversing -UBE: level 1, 2' forward, pace 4.0     PATIENT EDUCATION: Education details: reviewed HEP Person educated: Patient Education method: Explanation, Demonstration, and Handouts Education comprehension: verbalized understanding and returned demonstration  HOME EXERCISE PROGRAM: 3/14: Table Slides and wall slides 3/25: A/ROM  GOALS: Goals reviewed with patient? Yes   SHORT TERM GOALS: Target date: 10/10/23  Pt will be provided with and educated on HEP to improve mobility in RUE required for use during ADL completion.   Goal status: MET  LONG TERM GOALS: Target date: 10/10/23  Pt will decrease pain in RUE to 3/10 or  less to improve ability to sleep for 2+ consecutive hours without waking due to pain.   Goal status: MET  2.  Pt will decrease RUE fascial restrictions to min amounts or less to improve mobility required for functional reaching tasks.   Goal status: MET  3.  Pt will increase RUE A/ROM by 15+ degrees to improve ability to use RUE when reaching overhead or behind back during dressing and bathing tasks.   Goal status: PARTIALLY MET  4.  Pt will increase RUE strength to 5/5 or greater to improve ability to use RUE when lifting or carrying items during meal preparation/housework/yardwork tasks.   Goal status: PARTIALLY MET  5.  Pt will return to highest level of function using RUE as dominant during functional task completion.   Goal status: MET   ASSESSMENT:  CLINICAL IMPRESSION: Reassessment completed this session, pt has met 4/6 goals and partially met 2 remaining goals. Pt reports she is using her RUE during all ADLs and is able to sleep without pain. Pt has occasional pain in the shoulder if she reaches a certain way or tries to pick up something too heavy. Pt is demonstrating improved ROM with abduction and er, improved strength throughout the RUE. Pt is pleased with the current functional level and is agreeable to discharge with HEP.   PERFORMANCE DEFICITS: in functional skills including in functional skills including ADLs, IADLs, coordination, tone, ROM, strength, pain, fascial restrictions, muscle spasms, and UE functional use.   PLAN:  OT FREQUENCY: 2x/week  OT DURATION: 4 weeks  PLANNED INTERVENTIONS: 97168 OT Re-evaluation, 97535 self care/ADL training, 14782 therapeutic exercise, 97530 therapeutic activity, 97140 manual therapy, 97035 ultrasound, 97010 moist heat, 97032 electrical stimulation (manual), passive range of motion, energy conservation, coping strategies training, patient/family education, and DME and/or AE instructions  CONSULTED AND AGREED WITH PLAN OF CARE:  Patient  PLAN FOR NEXT SESSION: N/A-discharge with HEP   Lafonda Piety, OTR/L  [phone] 10/17/2023, 10:04 AM

## 2023-10-17 NOTE — Patient Instructions (Signed)

## 2023-11-13 ENCOUNTER — Encounter: Payer: Self-pay | Admitting: Cardiology

## 2023-11-13 ENCOUNTER — Ambulatory Visit: Payer: Medicare Other | Attending: Cardiology | Admitting: Cardiology

## 2023-11-13 VITALS — BP 134/60 | HR 63 | Ht 67.0 in | Wt 148.8 lb

## 2023-11-13 DIAGNOSIS — D6869 Other thrombophilia: Secondary | ICD-10-CM | POA: Insufficient documentation

## 2023-11-13 DIAGNOSIS — I480 Paroxysmal atrial fibrillation: Secondary | ICD-10-CM | POA: Insufficient documentation

## 2023-11-13 DIAGNOSIS — I6523 Occlusion and stenosis of bilateral carotid arteries: Secondary | ICD-10-CM | POA: Insufficient documentation

## 2023-11-13 DIAGNOSIS — I10 Essential (primary) hypertension: Secondary | ICD-10-CM | POA: Diagnosis not present

## 2023-11-13 NOTE — Progress Notes (Signed)
 Clinical Summary Lydia Graham is a 88 y.o.female seen today for follow up of the following medical problems.    1.Afib - followed by EP Dr Nancey - has been on amio 100mg  qod     04/01/23 ER visit with palpitations - home HRs reported at 170, by ER evaluation was already back in SR - K 4.1, ER EKG showed SR LBBB - felt heart go back into rhythm while in ER      - rare palpitations, roughly about once a month but can last a few hours. Will take additional metoprolol .  - compliant with meds - no bleeding on eliquis .     2.Systolic dysfunction 12/2021 echo LVEF 45-50%, no MR. This was in setting of admission with sepsis, afib with RVR 03/2022 echo LVEF 55-60%, no WMAs, grade II dd  - no SOB/DOE, no recent edema       3. HTN - compliant with meds - lightheaded with lower bp's, 110s/60s felt like was going      4. Bilateral LE edema - no recent issues   5. Carotid stenosis - prior CEA - 05/2021 study 1-39% bilateral disease 05/2022 RICA 1-39%, LICA 1-39% - followed by vascular, last appt Jan 2025.  - Jan 2025 1-39% bilateral stenosis.    6. Mitral regurgitation - moderate by echo 2019 12/2021 echo LVEF 45-50%, no MR - 03/2022 echo LVEF 55-60%, mild MR  7. HLD - reports intolerance to medications.      Past Medical History:  Diagnosis Date   Arthritis    oa, all over - multiple areas    Carotid artery occlusion    Dysrhythmia    AFib    GERD (gastroesophageal reflux disease)    Headache    h/o migraines    Hypertension    PAF (paroxysmal atrial fibrillation) (HCC)    a. multiple DCCV's in 2019 and eventually required initiation of Amiodarone  with successful DCCV after this. b. recurrent in 12/2021 while admitted for Urosepsis     Allergies  Allergen Reactions   Crestor [Rosuvastatin] Other (See Comments)    Myalgias   Other Other (See Comments)    Hypotension caused by pain medication/anesthesia   Singulair [Montelukast Sodium] Other (See  Comments)    Fatigue    Zestril [Lisinopril] Cough     Current Outpatient Medications  Medication Sig Dispense Refill   amiodarone  (PACERONE ) 200 MG tablet TAKE 1/2 TABLET BY MOUTH DAILY 45 tablet 1   amLODipine  (NORVASC ) 5 MG tablet TAKE 1 TABLET BY MOUTH AT 8 am AND TAKE 1 TABLET BY MOUTH AT 8 pm 180 tablet 1   baclofen  (LIORESAL ) 10 MG tablet Take 1 tablet (10 mg total) by mouth at bedtime as needed for muscle spasms. 30 each 0   Calcium  Carb-Cholecalciferol (CALCIUM  + VITAMIN D3 PO) Take 1 tablet by mouth daily.     Camphor-Menthol -Methyl Sal (SALONPAS) 3.06-29-08 % PTCH Place 1 patch onto the skin at bedtime as needed (foot pain).     Cyanocobalamin  (VITAMIN B-12 PO) Take 1 tablet by mouth daily.     ELIQUIS  5 MG TABS tablet TAKE 1 TABLET BY MOUTH TWICE DAILY 240 tablet 2   estradiol  (ESTRACE ) 0.5 MG tablet Take 0.5 mg by mouth daily.      Hypromellose (ARTIFICIAL TEARS OP) Place 2 drops into both eyes 2 (two) times daily as needed (for dry eyes).     losartan  (COZAAR ) 50 MG tablet TAKE 1 TABLET BY MOUTH AT 10  am AND TAKE 1 TABLET BY MOUTH AT 10 pm 60 tablet 3   MAGNESIUM  PO Take 1 capsule by mouth at bedtime as needed (cramping).     metoprolol  succinate (TOPROL -XL) 25 MG 24 hr tablet Take 1 tablet (25 mg total) by mouth daily. 30 tablet 6   pantoprazole  (PROTONIX ) 40 MG tablet TAKE 1 TABLET BY MOUTH TWICE DAILY 60 tablet 1   UNABLE TO FIND Med Name: Mediven Comfort calf medical compression stocking 30-40 ICD: I83.893 1 each 1   No current facility-administered medications for this visit.     Past Surgical History:  Procedure Laterality Date   ABDOMINAL HYSTERECTOMY     APPENDECTOMY     BACK SURGERY     CARDIOVERSION N/A 12/19/2017   Procedure: CARDIOVERSION;  Surgeon: Alvan Dorn FALCON, MD;  Location: [facility];  Service: Endoscopy;  Laterality: N/A;   CARDIOVERSION N/A 12/26/2017   Procedure: CARDIOVERSION;  Surgeon: Mona Vinie BROCKS, MD;  Location: [facility] ENDOSCOPY;  Service:  Cardiovascular;  Laterality: N/A;   CARDIOVERSION N/A 01/26/2018   Procedure: CARDIOVERSION;  Surgeon: Jeffrie Oneil BROCKS, MD;  Location: [facility] ENDOSCOPY;  Service: Cardiovascular;  Laterality: N/A;   CAROTID ENDARTERECTOMY     CATARACT EXTRACTION W/PHACO Right 03/09/2021   Procedure: CATARACT EXTRACTION PHACO AND INTRAOCULAR LENS PLACEMENT with Placement of Corticosteroid (IOC);  Surgeon: Harrie Agent, MD;  Location: [facility] ORS;  Service: Ophthalmology;  Laterality: Right;  CDE 9.16   CATARACT EXTRACTION W/PHACO Left 04/02/2021   Procedure: CATARACT EXTRACTION PHACO AND INTRAOCULAR LENS PLACEMENT LEFT EYE;  Surgeon: Harrie Agent, MD;  Location: [facility] ORS;  Service: Ophthalmology;  Laterality: Left;  left CDE=6.75   ENDARTERECTOMY Left 03/27/2017   Procedure: ENDARTERECTOMY CAROTID LEFT;  Surgeon: Sheree Penne Bruckner, MD;  Location: [facility] OR;  Service: Vascular;  Laterality: Left;   INNER EAR SURGERY Right    puntured eardrum- repaired 2x's    KNEE ARTHROPLASTY     NASAL SINUS SURGERY     deviated septum   PATCH ANGIOPLASTY Left 03/27/2017   Procedure: PATCH ANGIOPLASTY LEFT CAROTID ARTERY USING GEORGE BIOLOGIC PATCH;  Surgeon: Sheree Penne Bruckner, MD;  Location: [facility] OR;  Service: Vascular;  Laterality: Left;   TONSILLECTOMY     TOTAL KNEE ARTHROPLASTY Right 10/23/2016   Procedure: RIGHT TOTAL KNEE ARTHROPLASTY;  Surgeon: Harden Jerona GAILS, MD;  Location: [facility] OR;  Service: Orthopedics;  Laterality: Right;     Allergies  Allergen Reactions   Crestor [Rosuvastatin] Other (See Comments)    Myalgias   Other Other (See Comments)    Hypotension caused by pain medication/anesthesia   Singulair [Montelukast Sodium] Other (See Comments)    Fatigue    Zestril [Lisinopril] Cough      Family History  Problem Relation Age of Onset   Stroke Mother    Rheum arthritis Sister      Social History Lydia Graham reports that she has never smoked. She has never used smokeless tobacco. Lydia Graham reports  no history of alcohol  use.    Physical Examination Today's Vitals   11/13/23 1116  BP: 134/60  Pulse: 63  SpO2: 93%  Weight: 148 lb 12.8 oz (67.5 kg)  Height: 5' 7 (1.702 m)   Body mass index is 23.31 kg/m.  Gen: resting comfortably, no acute distress HEENT: no scleral icterus, pupils equal round and reactive, no palptable cervical adenopathy,  CV: irreg Resp: Clear to auscultation bilaterally GI: abdomen is soft, non-tender, non-distended, normal bowel sounds, no hepatosplenomegaly MSK: extremities are warm,  no edema.  Skin: warm, no rash Neuro:  no focal deficits Psych: appropriate affect   Diagnostic Studies 02/2021 echo Global longitudinal strain was attempted.  IMPRESSIONS     1. Left ventricular ejection fraction, by estimation, is 65 to 70%. The  left ventricle has normal function. The left ventricle has no regional  wall motion abnormalities. Left ventricular diastolic parameters are  indeterminate.   2. Right ventricular systolic function is normal. The right ventricular  size is normal. There is mildly elevated pulmonary artery systolic  pressure.   3. Left atrial size was mildly dilated.   4. The mitral valve is normal in structure. Trivial mitral valve  regurgitation. No evidence of mitral stenosis.   5. The aortic valve is tricuspid. Aortic valve regurgitation is not  visualized. No aortic stenosis is present.   6. The inferior vena cava is normal in size with greater than 50%  respiratory variability, suggesting right atrial pressure of 3 mmHg.   Comparison(s): Previous Echo was technically challenging due to A-fib. LV  EF was @50 %, with moderate MR and TR, and biatrial enlargement.     12/2021 echo    1. Left ventricular ejection fraction, by estimation, is 45 to 50%. The  left ventricle has mildly decreased function. The left ventricle has no  regional wall motion abnormalities. There is mild left ventricular  hypertrophy. Left ventricular  diastolic  parameters are indeterminate.   2. Right ventricular systolic function is normal. The right ventricular  size is normal. There is mildly elevated pulmonary artery systolic  pressure.   3. Left atrial size was mildly dilated.   4. Right atrial size was mildly dilated.   5. The mitral valve is normal in structure. No evidence of mitral valve  regurgitation. No evidence of mitral stenosis.   6. The aortic valve is normal in structure. Aortic valve regurgitation is  not visualized. No aortic stenosis is present.   7. The inferior vena cava is normal in size with greater than 50%  respiratory variability, suggesting right atrial pressure of 3 mmHg.     05/2021 carotid US  Summary:  Right Carotid: Velocities in the right ICA are consistent with a 1-39%  stenosis.   Left Carotid: Velocities in the left ICA are consistent with a 1-39%  stenosis.   Vertebrals:  Bilateral vertebral arteries demonstrate antegrade flow.  Subclavians: Normal flow hemodynamics were seen in bilateral subclavian               arteries.      03/2022 echo 1. Left ventricular ejection fraction, by estimation, is 55 to 60%. The  left ventricle has normal function. The left ventricle has no regional  wall motion abnormalities. There is mild asymmetric left ventricular  hypertrophy of the basal segment. Left  ventricular diastolic parameters are consistent with Grade II diastolic  dysfunction (pseudonormalization). The average left ventricular global  longitudinal strain is -19.8 %. The global longitudinal strain is normal.   2. Right ventricular systolic function is normal. The right ventricular  size is normal. There is mildly elevated pulmonary artery systolic  pressure. The estimated right ventricular systolic pressure is 40.7 mmHg.   3. Left atrial size was mildly dilated.   4. The mitral valve is grossly normal. Mild mitral valve regurgitation.   5. Tricuspid valve regurgitation is mild to  moderate.   6. The aortic valve is tricuspid. Aortic valve regurgitation is not  visualized. Aortic valve sclerosis is present, with no evidence of aortic  valve stenosis.   7. The inferior vena cava is normal in size with greater than 50%  respiratory variability, suggesting right atrial pressure of 3 mmHg.           Assessment and Plan   Afib/acquired thrombophilia -overall doing well, continue current meds  2. HTN  -at goal, continue current meds  3. Carotid stenosis - mild bilateral disease by US  earlier this year, continue to monitor  4. Mitral regurgitation - mild by 2023 echo, repeat echo likely in 2026   Dorn PHEBE Ross, M.D.

## 2023-11-13 NOTE — Patient Instructions (Signed)

## 2023-12-01 ENCOUNTER — Ambulatory Visit: Payer: Medicare Other | Admitting: Family Medicine

## 2023-12-08 ENCOUNTER — Telehealth: Payer: Self-pay | Admitting: *Deleted

## 2023-12-08 NOTE — Telephone Encounter (Signed)
 Pt called needs an appointment for lower back. Please call [phone]

## 2023-12-12 ENCOUNTER — Encounter: Payer: Self-pay | Admitting: Physical Medicine and Rehabilitation

## 2023-12-12 ENCOUNTER — Ambulatory Visit (INDEPENDENT_AMBULATORY_CARE_PROVIDER_SITE_OTHER): Admitting: Physical Medicine and Rehabilitation

## 2023-12-12 DIAGNOSIS — M5416 Radiculopathy, lumbar region: Secondary | ICD-10-CM | POA: Diagnosis not present

## 2023-12-12 DIAGNOSIS — G8929 Other chronic pain: Secondary | ICD-10-CM

## 2023-12-12 DIAGNOSIS — M961 Postlaminectomy syndrome, not elsewhere classified: Secondary | ICD-10-CM

## 2023-12-12 DIAGNOSIS — M5442 Lumbago with sciatica, left side: Secondary | ICD-10-CM

## 2023-12-12 NOTE — Progress Notes (Unsigned)
 Lydia  KAYBREE Ileana Graham - 88 y.o. female MRN [id number]  Date of birth: 30-Dec-1935  Office Visit Note: Visit Date: 12/12/2023 PCP: Zarwolo, Gloria, FNP Referred by: Zarwolo, Gloria, FNP  Subjective: Chief Complaint  Patient presents with   Lower Back - Pain   HPI: Lydia  MADILYNE Graham is a 88 y.o. female who comes in today Per the request of chronic, worsening and severe left sided lower back pain radiating to buttock, hip, posterior aspect of her left thigh down to knee. Pain ongoing for several years, worsens with prolonged sitting. She describes pain as sore and aching sensation, currently rates as 10 out of 10. Some relief of pain with home exercise regimen, rest and use of medications. Lumbar MRI imaging from 2023 shows multi level spondylolysis, there is mild subarticular narrowing at L4-L5, left greater than right and moderate right and mild left foraminal stenosis at L4-L5. Multi level facet arthropathy. No high grade spinal canal stenosis. History of lumbar surgery at the age of 72 in [place], [place].  She underwent left L4-L5 interlaminar epidural steroid injection in our office on 06/03/2022, she reports greater than 80% relief of pain for 6 or more months. Patient denies focal weakness, numbness and tingling. No recent trauma or falls. She is currently using cane to assist with ambulation.   Chronic neck pain treated by Dr. Dave Eichman with [facility]. She sustained fall in December of 2024, underwent left knee incision and drainage of hematoma with Dr. Colette Davies, states she is doing well post surgery.   Patients course is complicated by atrial fibrillation (currently taking Eliquis ) and CHF.      Review of Systems  Musculoskeletal:  Positive for back pain and myalgias.  Neurological:  Negative for tingling, sensory change, focal weakness and weakness.  All other systems reviewed and are negative.  Otherwise per HPI.  Assessment & Plan: Visit Diagnoses:     ICD-10-CM   1. Chronic left-sided low back pain with left-sided sciatica  M54.42 Ambulatory referral to Physical Medicine Rehab   G89.29     2. Lumbar radiculopathy  M54.16 Ambulatory referral to Physical Medicine Rehab    3. Post laminectomy syndrome  M96.1 Ambulatory referral to Physical Medicine Rehab       Plan: Findings:  Chronic, worsening and severe left sided lower back pain radiating to buttock, hip, posterior aspect of her left thigh down to knee. Patient continues to have severe pain despite good conservative therapies such as home exercise regimen, rest and use of medications. Patients clinical presentation and exam are complex, her pain fits more L5 and S1 nerve distributions. I also feel there is a myofascial component contributing to her pain. Myofascial tenderness noted to left buttock region upon palpation. We discussed treatment plan in detail today. Next step is to perform diagnostic and hopefully left L4-L5 interlaminar epidural steroid injection under fluoroscopic guidance. She is currently taking Eliquis , we will need permission for her to discontinue prior to injection. She has no questions regarding injection at this time. No red flag symptoms noted upon exam today.     Meds & Orders: No orders of the defined types were placed in this encounter.   Orders Placed This Encounter  Procedures   Ambulatory referral to Physical Medicine Rehab    Follow-up: Return for Left L4-L5 interlaminar epidural steroid injection.   Procedures: No procedures performed      Clinical History: Narrative & Impression CLINICAL DATA:  Chronic neck pain.   EXAM: MRI CERVICAL  SPINE WITHOUT CONTRAST   TECHNIQUE: Multiplanar, multisequence MR imaging of the cervical spine was performed. No intravenous contrast was administered.   COMPARISON:  Cervical spine radiographs 04/30/2022   FINDINGS: Alignment: Grade 1 anterolisthesis from C4-5 through T1-2, greatest C6-7 where it measures  4-5 mm. Mild-to-moderate left convex curvature of the lower cervical spine.   Vertebrae: No fracture or suspicious marrow lesion. Moderate marrow edema associated with left-sided C1-2 arthropathy. Small right-sided C1-2 joint effusion. Mild degenerative endplate edema at J4-7.   Cord: Normal signal.   Posterior Fossa, vertebral arteries, paraspinal tissues: 1.5 cm T2 hyperintense nodule in the posterior right thyroid  lobe. Minor T2 hyperintensities in the pons, nonspecific but most often seen with chronic small vessel ischemia. Preserved vertebral artery flow voids.   Disc levels:   C2-3: Shallow right central disc protrusion without stenosis.   C3-4: Uncovertebral spurring and mild facet arthrosis result in mild-to-moderate right neural foraminal stenosis without spinal stenosis.   C4-5: Mild right and moderate to severe left facet arthrosis without stenosis.   C5-6: Severe disc space narrowing. Anterolisthesis with bulging uncovered disc, asymmetric right uncovertebral spurring, and moderate right and mild left facet arthrosis result in moderate right neural foraminal stenosis without spinal stenosis.   C6-7: Severe disc space narrowing. Anterolisthesis with disc uncovering, uncovertebral spurring, and moderate right and mild left facet arthrosis. No significant stenosis.   C7-T1: Anterolisthesis with disc uncovering and moderate right facet arthrosis. No stenosis.   T1-2: Anterolisthesis with disc uncovering and moderate right facet arthrosis. No stenosis.   IMPRESSION: 1. Multilevel cervical disc and facet degeneration without spinal stenosis. 2. Moderate right neural foraminal stenosis at C5-6. 3. Mild-to-moderate right neural foraminal stenosis at C3-4. 4. C1-2 arthropathy with left-sided marrow edema. 5. 1.5 cm incidental right thyroid  nodule. Recommend non-emergent thyroid  ultrasound. Reference: J Am Coll Radiol. 2015 Feb;12(2): 143-50     Electronically  Signed   By: Aundra Lee M.D.   On: 08/27/2022 12:08   She reports that she has never smoked. She has never used smokeless tobacco.  Recent Labs    08/11/23 0927  HGBA1C 5.5    Objective:  VS:  HT:    WT:   BMI:     BP:   HR: bpm  TEMP: ( )  RESP:  Physical Exam Vitals and nursing note reviewed.  HENT:     Head: Normocephalic and atraumatic.     Right Ear: External ear normal.     Left Ear: External ear normal.     Nose: Nose normal.     Mouth/Throat:     Mouth: Mucous membranes are moist.   Eyes:     Extraocular Movements: Extraocular movements intact.    Cardiovascular:     Rate and Rhythm: Normal rate.     Pulses: Normal pulses.  Pulmonary:     Effort: Pulmonary effort is normal.  Abdominal:     General: Abdomen is flat. There is no distension.   Musculoskeletal:        General: Tenderness present.     Cervical back: Normal range of motion.     Comments: Patient is slow to rise from seated position to standing. Good lumbar range of motion. No pain noted with facet loading. 5/5 strength noted with bilateral hip flexion, knee flexion/extension, ankle dorsiflexion/plantarflexion and EHL. No clonus noted bilaterally. No pain upon palpation of greater trochanters. No pain with internal/external rotation of bilateral hips. Sensation intact bilaterally. Dysesthesias noted to left L5 and S1 dermatomes. Negative  slump test bilaterally. Ambulates with cane, gait slow and unsteady.     Skin:    General: Skin is warm and dry.     Capillary Refill: Capillary refill takes less than 2 seconds.   Neurological:     Mental Status: She is alert and oriented to person, place, and time.     Gait: Gait abnormal.   Psychiatric:        Mood and Affect: Mood normal.        Behavior: Behavior normal.     Ortho Exam  Imaging: No results found.  Past Medical/Family/Surgical/Social History: Medications & Allergies reviewed per EMR, new medications updated. Patient Active  Problem List   Diagnosis Date Noted   Upper respiratory infection 06/19/2023   Cervical spondylosis 06/10/2023   Hematoma of left knee region 06/03/2023   Right shoulder pain 05/01/2023   Cellulitis of left lower extremity 02/15/2023   Sinus pause 02/11/2023   LBBB (left bundle branch block) 02/11/2023   Knee pain 02/09/2023   Cellulitis 02/09/2023   Bursitis 02/08/2023   Normocytic anemia 02/08/2023   Thyroid  nodule greater than or equal to 1.5 cm in diameter incidentally noted on imaging study 09/03/2022   Myofascial neck pain 09/03/2022   Lumbar radiculopathy 05/08/2022   Hyperlipidemia 05/08/2022   Cellulitis of right forearm 01/22/2022   Sepsis secondary to UTI (HCC) 12/26/2021   Acute pyelonephritis 12/25/2021   Acute metabolic encephalopathy 12/25/2021   Sepsis (HCC) 12/25/2021   AKI (acute kidney injury) (HCC) 12/25/2021   Hyponatremia 12/25/2021   Dehydration 12/25/2021   Atrial fibrillation, chronic (HCC) 12/25/2021   Essential hypertension 12/25/2021   GERD (gastroesophageal reflux disease) 12/25/2021   Acquired trigger finger of left middle finger 08/04/2020   Arthritis of hand 08/04/2020   Osteoarthritis of metacarpophalangeal (MCP) joint 08/03/2020   Bilateral hand pain 06/22/2020   Osteoarthritis of hands, 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   Osteoarthritis of feet, 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   PAF (paroxysmal atrial fibrillation) (HCC)    Chronic cough 12/29/2017   Atrial fibrillation with rapid ventricular response (HCC) 12/23/2017   Acute on chronic combined systolic and diastolic CHF (congestive heart failure) (HCC)    Menopausal symptom 10/17/2017   Menopausal syndrome 10/17/2017   Carotid stenosis 03/27/2017   S/P total knee arthroplasty, right 10/23/2016   Unilateral primary osteoarthritis, right knee 07/13/2016   Past Medical History:  Diagnosis Date   Arthritis    oa, all over - multiple areas    Carotid artery occlusion    Dysrhythmia    AFib    GERD  (gastroesophageal reflux disease)    Headache    h/o migraines    Hypertension    PAF (paroxysmal atrial fibrillation) (HCC)    a. multiple DCCV's in 2019 and eventually required initiation of Amiodarone  with successful DCCV after this. b. recurrent in 12/2021 while admitted for Urosepsis   Family History  Problem Relation Age of Onset   Stroke Mother    Rheum arthritis Sister    Past Surgical History:  Procedure Laterality Date   ABDOMINAL HYSTERECTOMY     APPENDECTOMY     BACK SURGERY     CARDIOVERSION N/A 12/19/2017   Procedure: CARDIOVERSION;  Surgeon: Laurann Pollock, MD;  Location: [facility];  Service: Endoscopy;  Laterality: N/A;   CARDIOVERSION N/A 12/26/2017   Procedure: CARDIOVERSION;  Surgeon: Hazle Lites, MD;  Location: [facility] ENDOSCOPY;  Service: Cardiovascular;  Laterality: N/A;   CARDIOVERSION N/A 01/26/2018   Procedure: CARDIOVERSION;  Surgeon: Dorothye Gathers  C, MD;  Location: [facility] ENDOSCOPY;  Service: Cardiovascular;  Laterality: N/A;   CAROTID ENDARTERECTOMY     CATARACT EXTRACTION W/PHACO Right 03/09/2021   Procedure: CATARACT EXTRACTION PHACO AND INTRAOCULAR LENS PLACEMENT with Placement of Corticosteroid (IOC);  Surgeon: Tarri Farm, MD;  Location: [facility] ORS;  Service: Ophthalmology;  Laterality: Right;  CDE 9.16   CATARACT EXTRACTION W/PHACO Left 04/02/2021   Procedure: CATARACT EXTRACTION PHACO AND INTRAOCULAR LENS PLACEMENT LEFT EYE;  Surgeon: Tarri Farm, MD;  Location: [facility] ORS;  Service: Ophthalmology;  Laterality: Left;  left CDE=6.75   ENDARTERECTOMY Left 03/27/2017   Procedure: ENDARTERECTOMY CAROTID LEFT;  Surgeon: Adine Hoof, MD;  Location: [facility] OR;  Service: Vascular;  Laterality: Left;   INNER EAR SURGERY Right    puntured eardrum- repaired 2x's    KNEE ARTHROPLASTY     NASAL SINUS SURGERY     deviated septum   PATCH ANGIOPLASTY Left 03/27/2017   Procedure: PATCH ANGIOPLASTY LEFT CAROTID ARTERY USING Corinna Dickens BIOLOGIC PATCH;  Surgeon:  Adine Hoof, MD;  Location: [facility] OR;  Service: Vascular;  Laterality: Left;   TONSILLECTOMY     TOTAL KNEE ARTHROPLASTY Right 10/23/2016   Procedure: RIGHT TOTAL KNEE ARTHROPLASTY;  Surgeon: Timothy Ford, MD;  Location: [facility] OR;  Service: Orthopedics;  Laterality: Right;   Social History   Occupational History   Not on file  Tobacco Use   Smoking status: Never   Smokeless tobacco: Never  Vaping Use   Vaping status: Never Used  Substance and Sexual Activity   Alcohol  use: No   Drug use: No   Sexual activity: Not Currently    Birth control/protection: Surgical

## 2023-12-12 NOTE — Progress Notes (Unsigned)
 Pain Scale   Average Pain 9 Patient advising she has chronic lower back to left hip area pain and the last injection she got help a lot. Patient advising that she wants another injection        +Driver, -BT, -Dye Allergies.

## 2023-12-18 ENCOUNTER — Other Ambulatory Visit: Payer: Self-pay | Admitting: Cardiology

## 2023-12-22 ENCOUNTER — Telehealth: Payer: Self-pay | Admitting: Physical Medicine and Rehabilitation

## 2023-12-22 NOTE — Telephone Encounter (Signed)
 Patient called and wants to know if she got approved for a injection. RA#[phone]

## 2023-12-23 ENCOUNTER — Other Ambulatory Visit: Payer: Self-pay | Admitting: Cardiology

## 2023-12-29 ENCOUNTER — Telehealth: Payer: Self-pay | Admitting: Cardiology

## 2023-12-29 NOTE — Telephone Encounter (Signed)
 Patient with diagnosis of afib on Eliquis  for anticoagulation.    Procedure:  interlaminar steroid injection in lower  back  Date of procedure: 01/08/24   CHA2DS2-VASc Score = 6   This indicates a 9.7% annual risk of stroke. The patient's score is based upon: CHF History: 1 HTN History: 1 Diabetes History: 0 Stroke History: 0 Vascular Disease History: 1 Age Score: 2 Gender Score: 1      CrCl 42 ml/min Platelet count 335  Patient has not had an Afib/aflutter ablation within the last 3 months or DCCV within the last 30 days  Per office protocol, patient can hold Eliquis  for 3 days prior to procedure.    **This guidance is not considered finalized until pre-operative APP has relayed final recommendations.**

## 2023-12-29 NOTE — Telephone Encounter (Signed)
   Pre-operative Risk Assessment    Patient Name: Lydia  BERENIS Graham  DOB: 11-09-1935 MRN: [id number]   Date of last office visit: 11/13/23 Date of next office visit: 01/23/24   Request for Surgical Clearance    Procedure:  interlaminar steroid injection in lower  back  Date of Surgery:  Clearance 01/08/24                                Surgeon:  Dr. Harman Surgeon's Group or Practice Name:  [facility] Phone number:  [phone] Fax number:  [phone]   Type of Clearance Requested:   - Medical    Type of Anesthesia:  None    Additional requests/questions:  no  Signed, [name]   12/29/2023, 11:24 AM

## 2023-12-30 ENCOUNTER — Other Ambulatory Visit: Payer: Self-pay | Admitting: Cardiology

## 2023-12-31 NOTE — Telephone Encounter (Addendum)
   Patient Name: Lydia Graham  DOB: 11-19-1935 MRN: [id number]  Primary Cardiologist: Alvan Carrier, MD  Chart reviewed as part of pre-operative protocol coverage. Given past medical history and time since last visit, based on ACC/AHA guidelines, Lydia Graham is at acceptable risk for the planned procedure without further cardiovascular testing.   Per Dr. Alvan, [facility] for procedure from cardiac standpoint, can hold anticoag as pharmacy has recommended   JINNY Alvan MD  Per office protocol, patient can hold Eliquis  for 3 days prior to procedure.  Please resume Eliquis  as soon as possible postprocedure, at the discretion of the surgeon.   I will route this recommendation to the requesting party via Epic fax function and remove from pre-op pool.  Please call with questions.  Damien JAYSON Braver, NP 12/31/2023, 4:49 PM

## 2024-01-07 ENCOUNTER — Other Ambulatory Visit: Payer: Self-pay | Admitting: Cardiology

## 2024-01-08 ENCOUNTER — Other Ambulatory Visit: Payer: Self-pay

## 2024-01-08 ENCOUNTER — Ambulatory Visit: Admitting: Physical Medicine and Rehabilitation

## 2024-01-08 VITALS — BP 188/80 | HR 70

## 2024-01-08 DIAGNOSIS — M5416 Radiculopathy, lumbar region: Secondary | ICD-10-CM | POA: Diagnosis not present

## 2024-01-08 MED ORDER — METHYLPREDNISOLONE ACETATE 40 MG/ML IJ SUSP
40.0000 mg | Freq: Once | INTRAMUSCULAR | Status: AC
  Administered 2024-01-08: 40 mg

## 2024-01-08 NOTE — Progress Notes (Signed)
 Pain Scale   Average Pain 6 Patient advising she has chronic lower back pain that radiates bilaterally to hip area. Patient advised that sitting increases and heat help with pain.        +Driver, -BT, -Dye Allergies.

## 2024-01-08 NOTE — Patient Instructions (Signed)

## 2024-01-09 ENCOUNTER — Encounter: Payer: Self-pay | Admitting: Family Medicine

## 2024-01-09 ENCOUNTER — Ambulatory Visit (INDEPENDENT_AMBULATORY_CARE_PROVIDER_SITE_OTHER): Admitting: Family Medicine

## 2024-01-09 VITALS — BP 132/64 | HR 80 | Resp 16 | Ht 67.0 in | Wt 149.4 lb

## 2024-01-09 DIAGNOSIS — I10 Essential (primary) hypertension: Secondary | ICD-10-CM | POA: Diagnosis not present

## 2024-01-09 DIAGNOSIS — E559 Vitamin D deficiency, unspecified: Secondary | ICD-10-CM | POA: Diagnosis not present

## 2024-01-09 DIAGNOSIS — N952 Postmenopausal atrophic vaginitis: Secondary | ICD-10-CM

## 2024-01-09 DIAGNOSIS — E7849 Other hyperlipidemia: Secondary | ICD-10-CM

## 2024-01-09 DIAGNOSIS — E038 Other specified hypothyroidism: Secondary | ICD-10-CM | POA: Diagnosis not present

## 2024-01-09 DIAGNOSIS — R7301 Impaired fasting glucose: Secondary | ICD-10-CM

## 2024-01-09 NOTE — Patient Instructions (Addendum)
 I appreciate the opportunity to provide care to you today!    Follow up:  4 months  Labs: please stop by the lab during the week to get your blood drawn (CBC, CMP, TSH, Lipid profile, HgA1c, Vit D)  Please follow up with Dr. Rox at [facility] to discuss any changes to your estradiol  medication regimen.  Here are some foods to avoid or reduce in your diet to help manage cholesterol levels:  Fried Foods:Deep-fried items such as french fries, fried chicken, and fried snacks are high in unhealthy fats and can raise LDL (bad) cholesterol levels. Processed Meats:Foods like bacon, sausage, hot dogs, and deli meats are often high in saturated fat and cholesterol. Full-Fat Dairy Products:Whole milk, full-fat yogurt, butter, cream, and cheese are rich in saturated fats, which can increase cholesterol levels. Baked Goods and Sweets:Pastries, cakes, cookies, and donuts often contain trans fats and added sugars, which can raise LDL cholesterol and lower HDL (good) cholesterol. Red Meat:Beef, lamb, and pork are high in saturated fat. Lean cuts or plant-based protein alternatives are better options. Lard and Shortening:Used in some baked goods, lard and shortening are high in trans fats and should be avoided. Fast Food:Many fast food items are cooked with unhealthy oils and contain high amounts of saturated and trans fats. Processed Snacks:Chips, crackers, and certain microwave popcorns can contain trans fats and high levels of unhealthy oils. Shellfish:While nutritious in other ways, some shellfish like shrimp, lobster, and crab are high in cholesterol. They should be consumed in moderation. Coconut and Palm Oils:these oils are high in saturated fat and can raise cholesterol levels when used in cooking or baking.     Please follow up if your symptoms worsen or fail to improve.     Please continue to a heart-healthy diet and increase your physical activities. Try to exercise for at least  five days a week.    It was a pleasure to see you and I look forward to continuing to work together on your health and well-being. Please do not hesitate to call the office if you need care or have questions about your care.  In case of emergency, please visit the Emergency Department for urgent care, or contact our clinic at [phone] to schedule an appointment. We're here to help you!   Have a wonderful day and week. With Gratitude, Sequoyah Ramone MSN, FNP-BC'

## 2024-01-09 NOTE — Progress Notes (Signed)
 Established Patient Office Visit  Subjective:  Patient ID: Lydia  VEAR Graham, female    DOB: Jan 25, 1936  Age: 88 y.o. MRN: [id number]  CC:  Chief Complaint  Patient presents with   Hypertension    4 month follow up   Medication Problem    Has been on a low dose of estradiol  from her GYN and wants to see about coming off of it because he requires an internal exam every 2 years and she doesn't want to do those anymore because its uncomfortable to her and causes bloody discharge     HPI Lydia  H Graham is a 88 y.o. female with past medical history of  essential hypertension, GERD presents for f/u of  chronic medical conditions.  For the details of today's visit, please refer to the assessment and plan.      Past Medical History:  Diagnosis Date   Arthritis    oa, all over - multiple areas    Carotid artery occlusion    Dysrhythmia    AFib    GERD (gastroesophageal reflux disease)    Headache    h/o migraines    Hypertension    PAF (paroxysmal atrial fibrillation) (HCC)    a. multiple DCCV's in 2019 and eventually required initiation of Amiodarone  with successful DCCV after this. b. recurrent in 12/2021 while admitted for Urosepsis    Past Surgical History:  Procedure Laterality Date   ABDOMINAL HYSTERECTOMY     APPENDECTOMY     BACK SURGERY     CARDIOVERSION N/A 12/19/2017   Procedure: CARDIOVERSION;  Surgeon: Alvan Dorn FALCON, MD;  Location: [facility] END[facility] SUITE;  Service: Endoscopy;  Laterality: N/A;   CARDIOVERSION N/A 12/26/2017   Procedure: CARDIOVERSION;  Surgeon: Mona Vinie BROCKS, MD;  Location: [facility] ENDOSCOPY;  Service: Cardiovascular;  Laterality: N/A;   CARDIOVERSION N/A 01/26/2018   Procedure: CARDIOVERSION;  Surgeon: Jeffrie Oneil BROCKS, MD;  Location: [facility] ENDOSCOPY;  Service: Cardiovascular;  Laterality: N/A;   CAROTID ENDARTERECTOMY     CATARACT EXTRACTION W/PHACO Right 03/09/2021   Procedure: CATARACT EXTRACTION PHACO AND INTRAOCULAR LENS PLACEMENT with Placement of  Corticosteroid (IOC);  Surgeon: Harrie Agent, MD;  Location: [facility] ORS;  Service: Ophthalmology;  Laterality: Right;  CDE 9.16   CATARACT EXTRACTION W/PHACO Left 04/02/2021   Procedure: CATARACT EXTRACTION PHACO AND INTRAOCULAR LENS PLACEMENT LEFT EYE;  Surgeon: Harrie Agent, MD;  Location: [facility] ORS;  Service: Ophthalmology;  Laterality: Left;  left CDE=6.75   ENDARTERECTOMY Left 03/27/2017   Procedure: ENDARTERECTOMY CAROTID LEFT;  Surgeon: Sheree Penne Bruckner, MD;  Location: [facility] OR;  Service: Vascular;  Laterality: Left;   INNER EAR SURGERY Right    puntured eardrum- repaired 2x's    KNEE ARTHROPLASTY     NASAL SINUS SURGERY     deviated septum   PATCH ANGIOPLASTY Left 03/27/2017   Procedure: PATCH ANGIOPLASTY LEFT CAROTID ARTERY USING GEORGE BIOLOGIC PATCH;  Surgeon: Sheree Penne Bruckner, MD;  Location: [facility] OR;  Service: Vascular;  Laterality: Left;   TONSILLECTOMY     TOTAL KNEE ARTHROPLASTY Right 10/23/2016   Procedure: RIGHT TOTAL KNEE ARTHROPLASTY;  Surgeon: Harden Jerona GAILS, MD;  Location: [facility] OR;  Service: Orthopedics;  Laterality: Right;    Family History  Problem Relation Age of Onset   Stroke Mother    Rheum arthritis Sister     Social History   Socioeconomic History   Marital status: Widowed    Spouse name: Not on file   Number of children: Not on file  Years of education: Not on file   Highest education level: Not on file  Occupational History   Not on file  Tobacco Use   Smoking status: Never   Smokeless tobacco: Never  Vaping Use   Vaping status: Never Used  Substance and Sexual Activity   Alcohol  use: No   Drug use: No   Sexual activity: Not Currently    Birth control/protection: Surgical  Other Topics Concern   Not on file  Social History Narrative   Not on file   Social Drivers of Health   Financial Resource Strain: Low Risk  (06/28/2022)   Overall Financial Resource Strain (CARDIA)    Difficulty of Paying Living Expenses: Not hard at all   Food Insecurity: No Food Insecurity (07/30/2023)   Hunger Vital Sign    Worried About Running Out of Food in the Last Year: Never true    Ran Out of Food in the Last Year: Never true  Transportation Needs: No Transportation Needs (07/30/2023)   PRAPARE - Administrator, Civil Service (Medical): No    Lack of Transportation (Non-Medical): No  Physical Activity: Sufficiently Active (06/28/2022)   Exercise Vital Sign    Days of Exercise per Week: 3 days    Minutes of Exercise per Session: 50 min  Stress: No Stress Concern Present (06/28/2022)   [employer] of Occupational Health - Occupational Stress Questionnaire    Feeling of Stress : Not at all  Social Connections: Moderately Integrated (06/28/2022)   Social Connection and Isolation Panel    Frequency of Communication with Friends and Family: More than three times a week    Frequency of Social Gatherings with Friends and Family: More than three times a week    Attends Religious Services: More than 4 times per year    Active Member of [employer] or Organizations: Yes    Attends Banker Meetings: More than 4 times per year    Marital Status: Widowed  Intimate Partner Violence: Not At Risk (07/30/2023)   Humiliation, Afraid, Rape, and Kick questionnaire    Fear of Current or Ex-Partner: No    Emotionally Abused: No    Physically Abused: No    Sexually Abused: No    Outpatient Medications Prior to Visit  Medication Sig Dispense Refill   amiodarone  (PACERONE ) 200 MG tablet TAKE 1/2 TABLET BY MOUTH DAILY 45 tablet 1   amLODipine  (NORVASC ) 5 MG tablet TAKE 1 TABLET BY MOUTH AT 8 am AND TAKE 1 TABLET BY MOUTH AT 8 pm 180 tablet 1   baclofen  (LIORESAL ) 10 MG tablet Take 1 tablet (10 mg total) by mouth at bedtime as needed for muscle spasms. 30 each 0   Calcium  Carb-Cholecalciferol (CALCIUM  + VITAMIN D3 PO) Take 1 tablet by mouth daily.     Camphor-Menthol -Methyl Sal (SALONPAS) 3.06-29-08 % PTCH Place 1 patch onto the skin at  bedtime as needed (foot pain).     Cyanocobalamin  (VITAMIN B-12 PO) Take 1 tablet by mouth daily.     ELIQUIS  5 MG TABS tablet TAKE 1 TABLET BY MOUTH TWICE DAILY 240 tablet 2   estradiol  (ESTRACE ) 0.5 MG tablet Take 0.5 mg by mouth daily.      Hypromellose (ARTIFICIAL TEARS OP) Place 2 drops into both eyes 2 (two) times daily as needed (for dry eyes).     losartan  (COZAAR ) 50 MG tablet TAKE 1 TABLET BY MOUTH AT 10 am AND TAKE 1 TABLET BY MOUTH AT 10 pm 60 tablet 3  MAGNESIUM  PO Take 1 capsule by mouth at bedtime as needed (cramping).     metoprolol  succinate (TOPROL -XL) 25 MG 24 hr tablet TAKE 1 TABLET BY MOUTH DAILY 30 tablet 6   pantoprazole  (PROTONIX ) 40 MG tablet TAKE 1 TABLET BY MOUTH TWICE DAILY 60 tablet 1   UNABLE TO FIND Med Name: Mediven Comfort calf medical compression stocking 30-40 ICD: I83.893 1 each 1   No facility-administered medications prior to visit.    Allergies  Allergen Reactions   Crestor [Rosuvastatin] Other (See Comments)    Myalgias   Other Other (See Comments)    Hypotension caused by pain medication/anesthesia   Singulair [Montelukast Sodium] Other (See Comments)    Fatigue    Zestril [Lisinopril] Cough    ROS Review of Systems  Constitutional:  Negative for chills and fever.  Eyes:  Negative for visual disturbance.  Respiratory:  Negative for chest tightness and shortness of breath.   Neurological:  Negative for dizziness and headaches.      Objective:    Physical Exam HENT:     Head: Normocephalic.     Mouth/Throat:     Mouth: Mucous membranes are moist.  Cardiovascular:     Rate and Rhythm: Normal rate.     Heart sounds: Normal heart sounds.  Pulmonary:     Effort: Pulmonary effort is normal.     Breath sounds: Normal breath sounds.  Neurological:     Mental Status: She is alert.     BP 132/64   Pulse 80   Resp 16   Ht 5' 7 (1.702 m)   Wt 149 lb 6.4 oz (67.8 kg)   LMP  (LMP Unknown)   SpO2 92%   BMI 23.40 kg/m  Wt  Readings from Last 3 Encounters:  01/09/24 149 lb 6.4 oz (67.8 kg)  11/13/23 148 lb 12.8 oz (67.5 kg)  08/08/23 145 lb 9.6 oz (66 kg)    Lab Results  Component Value Date   TSH 3.930 08/11/2023   Lab Results  Component Value Date   WBC 6.2 08/11/2023   HGB 14.1 08/11/2023   HCT 44.3 08/11/2023   MCV 91 08/11/2023   PLT 335 08/11/2023   Lab Results  Component Value Date   NA 136 08/11/2023   K 4.5 08/11/2023   CO2 19 (L) 08/11/2023   GLUCOSE 87 08/11/2023   BUN 16 08/11/2023   CREATININE 1.01 (H) 08/11/2023   BILITOT 0.5 08/11/2023   ALKPHOS 91 08/11/2023   AST 21 08/11/2023   ALT 9 08/11/2023   PROT 7.0 08/11/2023   ALBUMIN  4.5 08/11/2023   CALCIUM  9.8 08/11/2023   ANIONGAP 9 05/30/2023   EGFR 55 (L) 08/11/2023   Lab Results  Component Value Date   CHOL 254 (H) 08/11/2023   Lab Results  Component Value Date   HDL 58 08/11/2023   Lab Results  Component Value Date   LDLCALC 161 (H) 08/11/2023   Lab Results  Component Value Date   TRIG 190 (H) 08/11/2023   Lab Results  Component Value Date   CHOLHDL 4.4 08/11/2023   Lab Results  Component Value Date   HGBA1C 5.5 08/11/2023      Assessment & Plan:  Essential hypertension Assessment & Plan: Controlled BP today in the clinic Encouraged to continue current treatment regimen as is A low-sodium diet and increased physical activity were also recommended.    Vaginal atrophy Assessment & Plan: The patient reports she has been on a low dose of  estradiol , prescribed by her gynecologist at a women's clinic in [place], for the management of vaginal dryness. She expresses interest in discontinuing the medication due to the requirement of undergoing an internal pelvic exam every two years, which she finds uncomfortable and associated with post-exam bloody discharge. The patient had a hysterectomy at age 47 due to ovarian cysts. She is no longer actively seeing a gynecologist and is seeking guidance on how to  proceed with her hormone therapy. Encouraged the patient to follow up with Dr. Rox at [facility] to discuss the risks and benefits of continuing or discontinuing estradiol  therapy and to determine appropriate next steps in her care.    IFG (impaired fasting glucose) -     Hemoglobin A1c  Vitamin D  deficiency -     VITAMIN D  25 Hydroxy (Vit-D Deficiency, Fractures)  TSH (thyroid -stimulating hormone deficiency) -     TSH + free T4  Other hyperlipidemia -     Lipid panel -     CMP14+EGFR -     CBC with Differential/Platelet  Note: This chart has been completed using Engineer, civil (consulting) software, and while attempts have been made to ensure accuracy, certain words and phrases may not be transcribed as intended.    Follow-up: Return in about 4 months (around 05/11/2024).   Chisom Aust, FNP

## 2024-01-11 DIAGNOSIS — N952 Postmenopausal atrophic vaginitis: Secondary | ICD-10-CM | POA: Insufficient documentation

## 2024-01-11 NOTE — Assessment & Plan Note (Signed)
 Controlled BP today in the clinic Encouraged to continue current treatment regimen as is A low-sodium diet and increased physical activity were also recommended.

## 2024-01-11 NOTE — Assessment & Plan Note (Signed)
 The patient reports she has been on a low dose of estradiol , prescribed by her gynecologist at a women's clinic in [place], for the management of vaginal dryness. She expresses interest in discontinuing the medication due to the requirement of undergoing an internal pelvic exam every two years, which she finds uncomfortable and associated with post-exam bloody discharge. The patient had a hysterectomy at age 88 due to ovarian cysts. She is no longer actively seeing a gynecologist and is seeking guidance on how to proceed with her hormone therapy. Encouraged the patient to follow up with Dr. Rox at [facility] to discuss the risks and benefits of continuing or discontinuing estradiol  therapy and to determine appropriate next steps in her care.

## 2024-01-12 DIAGNOSIS — E038 Other specified hypothyroidism: Secondary | ICD-10-CM | POA: Diagnosis not present

## 2024-01-12 DIAGNOSIS — R7301 Impaired fasting glucose: Secondary | ICD-10-CM | POA: Diagnosis not present

## 2024-01-12 DIAGNOSIS — E559 Vitamin D deficiency, unspecified: Secondary | ICD-10-CM | POA: Diagnosis not present

## 2024-01-12 DIAGNOSIS — E7849 Other hyperlipidemia: Secondary | ICD-10-CM | POA: Diagnosis not present

## 2024-01-13 LAB — LIPID PANEL
Chol/HDL Ratio: 4.3 ratio (ref 0.0–4.4)
Cholesterol, Total: 236 mg/dL — ABNORMAL HIGH (ref 100–199)
HDL: 55 mg/dL (ref >39)
LDL Chol Calc (NIH): 142 mg/dL — ABNORMAL HIGH (ref 0–99)
Triglycerides: 220 mg/dL — ABNORMAL HIGH (ref 0–149)
VLDL Cholesterol Cal: 39 mg/dL (ref 5–40)

## 2024-01-13 LAB — TSH+FREE T4
Free T4: 1.32 ng/dL (ref 0.82–1.77)
TSH: 2.56 u[IU]/mL (ref 0.450–4.500)

## 2024-01-13 LAB — CMP14+EGFR
ALT: 9 IU/L (ref 0–32)
AST: 17 IU/L (ref 0–40)
Albumin: 4.4 g/dL (ref 3.7–4.7)
Alkaline Phosphatase: 90 IU/L (ref 44–121)
BUN/Creatinine Ratio: 18 (ref 12–28)
BUN: 20 mg/dL (ref 8–27)
Bilirubin Total: 0.9 mg/dL (ref 0.0–1.2)
CO2: 16 mmol/L — ABNORMAL LOW (ref 20–29)
Calcium: 9.5 mg/dL (ref 8.7–10.3)
Chloride: 102 mmol/L (ref 96–106)
Creatinine, Ser: 1.1 mg/dL — ABNORMAL HIGH (ref 0.57–1.00)
Globulin, Total: 2.4 g/dL (ref 1.5–4.5)
Glucose: 87 mg/dL (ref 70–99)
Potassium: 4.5 mmol/L (ref 3.5–5.2)
Sodium: 138 mmol/L (ref 134–144)
Total Protein: 6.8 g/dL (ref 6.0–8.5)
eGFR: 49 mL/min/1.73 — ABNORMAL LOW (ref >59)

## 2024-01-13 LAB — CBC WITH DIFFERENTIAL/PLATELET
Basophils Absolute: 0.1 x10E3/uL (ref 0.0–0.2)
Basos: 1 %
EOS (ABSOLUTE): 0.2 x10E3/uL (ref 0.0–0.4)
Eos: 3 %
Hematocrit: 43.9 % (ref 34.0–46.6)
Hemoglobin: 13.9 g/dL (ref 11.1–15.9)
Immature Grans (Abs): 0 x10E3/uL (ref 0.0–0.1)
Immature Granulocytes: 0 %
Lymphocytes Absolute: 2.2 x10E3/uL (ref 0.7–3.1)
Lymphs: 35 %
MCH: 30.2 pg (ref 26.6–33.0)
MCHC: 31.7 g/dL (ref 31.5–35.7)
MCV: 95 fL (ref 79–97)
Monocytes Absolute: 0.5 x10E3/uL (ref 0.1–0.9)
Monocytes: 8 %
Neutrophils Absolute: 3.4 x10E3/uL (ref 1.4–7.0)
Neutrophils: 53 %
Platelets: 326 x10E3/uL (ref 150–450)
RBC: 4.6 x10E6/uL (ref 3.77–5.28)
RDW: 12.7 % (ref 11.7–15.4)
WBC: 6.4 x10E3/uL (ref 3.4–10.8)

## 2024-01-13 LAB — VITAMIN D 25 HYDROXY (VIT D DEFICIENCY, FRACTURES): Vit D, 25-Hydroxy: 38.8 ng/mL (ref 30.0–100.0)

## 2024-01-13 LAB — HEMOGLOBIN A1C
Est. average glucose Bld gHb Est-mCnc: 100 mg/dL
Hgb A1c MFr Bld: 5.1 % (ref 4.8–5.6)

## 2024-01-17 ENCOUNTER — Ambulatory Visit: Payer: Self-pay | Admitting: Family Medicine

## 2024-01-18 NOTE — Progress Notes (Signed)
 Lydia Graham - 88 y.o. female MRN [id number]  Date of birth: 02-03-1936  Office Visit Note: Visit Date: 01/08/2024 PCP: Zarwolo, Gloria, FNP Referred by: Zarwolo, Gloria, FNP  Subjective: Chief Complaint  Patient presents with   Lower Back - Pain   HPI:  Lydia Graham is a 88 y.o. female who comes in today at the request of Duwaine Pouch, FNP for planned Left L5-S1 Lumbar Interlaminar epidural steroid injection with fluoroscopic guidance.  The patient has failed conservative care including home exercise, medications, time and activity modification.  This injection will be diagnostic and hopefully therapeutic.  Please see requesting physician notes for further details and justification.  By history she was followed by Dr. Anderson for quite a while.  She has had greater trochanteric injections in the past.  She does have lateral recess narrowing at L4-5 left more than right.  She has some right foraminal narrowing higher up.  Degenerative scoliotic change and facet arthropathy.  No high-grade central stenosis.  Prior right-sided laminectomy at L4-5.  Elected today to complete an L5-S1 injection given the symptoms on the lateral thigh.  Consideration would be given to transforaminal approach.  Had a lengthy talk with her daughter who comments that the patient will sometimes have pain so severe that she almost has to cry out in pain.  She does not necessarily cry out she just mentions as how bad it is.  Today she is not really indicating a great deal of pain with movement.  Hopefully the injection will help.  Consideration given to referral to chronic pain management perhaps.   ROS Otherwise per HPI.  Assessment & Plan: Visit Diagnoses:    ICD-10-CM   1. Lumbar radiculopathy  M54.16 XR C-ARM NO REPORT    Epidural Steroid injection    methylPREDNISolone  acetate (DEPO-MEDROL ) injection 40 mg      Plan: No additional findings.   Meds & Orders:  Meds ordered this encounter   Medications   methylPREDNISolone  acetate (DEPO-MEDROL ) injection 40 mg    Orders Placed This Encounter  Procedures   XR C-ARM NO REPORT   Epidural Steroid injection    Follow-up: Return if symptoms worsen or fail to improve.   Procedures: No procedures performed  Lumbar Epidural Steroid Injection - Interlaminar Approach with Fluoroscopic Guidance  Patient: Lydia Graham      Date of Birth: 11/21/1935 MRN: [id number] PCP: Zarwolo, Gloria, FNP      Visit Date: 01/08/2024   Universal Protocol:     Consent Given By: the patient  Position: PRONE  Additional Comments: Vital signs were monitored before and after the procedure. Patient was prepped and draped in the usual sterile fashion. The correct patient, procedure, and site was verified.   Injection Procedure Details:   Procedure diagnoses: Lumbar radiculopathy [M54.16]   Meds Administered:  Meds ordered this encounter  Medications   methylPREDNISolone  acetate (DEPO-MEDROL ) injection 40 mg     Laterality: Left  Location/Site:  L5-S1  Needle: 3.5 in., 20 ga. Tuohy  Needle Placement: Paramedian epidural  Findings:   -Comments: Excellent flow of contrast into the epidural space.  Procedure Details: Using a paramedian approach from the side mentioned above, the region overlying the inferior lamina was localized under fluoroscopic visualization and the soft tissues overlying this structure were infiltrated with 4 ml. of 1% Lidocaine  without Epinephrine . The Tuohy needle was inserted into the epidural space using a paramedian approach.   The epidural space was localized using loss of resistance along  with counter oblique bi-planar fluoroscopic views.  After negative aspirate for air, blood, and CSF, a 2 ml. volume of Isovue-250 was injected into the epidural space and the flow of contrast was observed. Radiographs were obtained for documentation purposes.    The injectate was administered into the level noted  above.   Additional Comments:  The patient tolerated the procedure well Dressing: 2 x 2 sterile gauze and Band-Aid    Post-procedure details: Patient was observed during the procedure. Post-procedure instructions were reviewed.  Patient left the clinic in stable condition.   Clinical History: MRI LUMBAR SPINE WITHOUT CONTRAST   TECHNIQUE: Multiplanar, multisequence MR imaging of the lumbar spine was performed. No intravenous contrast was administered.   COMPARISON:  Lumbar spine radiographs 05/07/2022. MRI of the lumbar spine 11/02/2013   FINDINGS: Segmentation: 5 non rib-bearing lumbar type vertebral bodies are present. The lowest fully formed vertebral body is L5.   Alignment: Grade 1 anterolisthesis at L5-S1 and to lesser extent at L3-4 is similar to 2015. Slight retrolisthesis at L1-2 is also similar. Leftward curvature is centered at L3.   Vertebrae: Chronic fatty endplate marrow changes are present on the right at L3-4 and on the left at L4-5 and L5-S1. Vertebral body heights are normal. No focal osseous lesions are present.   Conus medullaris and cauda equina: Conus extends to the L1-2 level. Conus and cauda equina appear normal.   Paraspinal and other soft tissues: Simple cysts are present within the kidneys bilaterally. No solid lesions are present. No significant adenopathy is present.   Disc levels:   T12-L1: Facet hypertrophy and disc bulging is present without significant stenosis.   L1-2: A leftward disc protrusion has progressed. Mild left foraminal narrowing is present.   L2-3: Mild facet hypertrophy is present bilaterally. A broad-based disc protrusion is present. Disc material extends into the foramina bilaterally. Mild bilateral foraminal narrowing has progressed.   L3-4: A rightward disc protrusion is present. Moderate facet hypertrophy is noted. The central canal is patent. Moderate to severe right and mild left foraminal stenosis has  progressed.   L4-5: Laminectomy noted. Chronic loss of disc height present. Mild subarticular narrowing is stable, left greater than right. Moderate right and mild left foraminal stenosis is stable.   L5-S1: A shallow disc protrusion is present. Moderate facet hypertrophy has progressed. No significant stenosis is present.   IMPRESSION: 1. Progressive multilevel spondylosis of the lumbar spine as described. 2. Mild left foraminal narrowing at L1-2. 3. Mild bilateral foraminal narrowing at L2-3 has progressed. 4. Moderate to severe right and mild left foraminal stenosis at L3-4 has progressed. 5. Mild subarticular narrowing at L4-5 is stable, left greater than right. 6. Moderate right and mild left foraminal stenosis at L4-5 is stable. 7. Progressive moderate facet hypertrophy at L5-S1 without significant stenosis. 8. Simple cysts in the kidneys bilaterally. No follow-up imaging is recommended. [employer] 2021; [id number], Bosniak Classification of Cystic Renal Masses, Version 2019.     Electronically Signed   By: Lonni Necessary M.D.   On: 05/18/2022 10:40     Objective:  VS:  HT:    WT:   BMI:     BP:(!) 188/80  HR:70bpm  TEMP: ( )  RESP:  Physical Exam Vitals and nursing note reviewed.  Constitutional:      General: She is not in acute distress.    Appearance: Normal appearance. She is not ill-appearing.  HENT:     Head: Normocephalic and atraumatic.     Right  Ear: External ear normal.     Left Ear: External ear normal.  Eyes:     Extraocular Movements: Extraocular movements intact.  Cardiovascular:     Rate and Rhythm: Normal rate.     Pulses: Normal pulses.  Pulmonary:     Effort: Pulmonary effort is normal. No respiratory distress.  Abdominal:     General: There is no distension.     Palpations: Abdomen is soft.  Musculoskeletal:        General: Tenderness present.     Cervical back: Neck supple.     Right lower leg: No edema.     Left lower  leg: No edema.     Comments: Patient has good distal strength with no pain over the greater trochanters.  No clonus or focal weakness.  Interestingly on the left lateral thigh with even mild palpation the patient was startled and jumped with pain response but then after that I could never reproduce that.  Skin:    Findings: No erythema, lesion or rash.  Neurological:     General: No focal deficit present.     Mental Status: She is alert and oriented to person, place, and time.     Cranial Nerves: No cranial nerve deficit.     Sensory: No sensory deficit.     Motor: No weakness or abnormal muscle tone.     Coordination: Coordination normal.     Gait: Gait abnormal.  Psychiatric:        Mood and Affect: Mood normal.        Behavior: Behavior normal.      Imaging: No results found.

## 2024-01-18 NOTE — Procedures (Signed)
 Lumbar Epidural Steroid Injection - Interlaminar Approach with Fluoroscopic Guidance  Patient: Lydia Graham  SADY MONACO      Date of Birth: Mar 29, 1936 MRN: [id number] PCP: Zarwolo, Gloria, FNP      Visit Date: 01/08/2024   Universal Protocol:     Consent Given By: the patient  Position: PRONE  Additional Comments: Vital signs were monitored before and after the procedure. Patient was prepped and draped in the usual sterile fashion. The correct patient, procedure, and site was verified.   Injection Procedure Details:   Procedure diagnoses: Lumbar radiculopathy [M54.16]   Meds Administered:  Meds ordered this encounter  Medications   methylPREDNISolone  acetate (DEPO-MEDROL ) injection 40 mg     Laterality: Left  Location/Site:  L5-S1  Needle: 3.5 in., 20 ga. Tuohy  Needle Placement: Paramedian epidural  Findings:   -Comments: Excellent flow of contrast into the epidural space.  Procedure Details: Using a paramedian approach from the side mentioned above, the region overlying the inferior lamina was localized under fluoroscopic visualization and the soft tissues overlying this structure were infiltrated with 4 ml. of 1% Lidocaine  without Epinephrine . The Tuohy needle was inserted into the epidural space using a paramedian approach.   The epidural space was localized using loss of resistance along with counter oblique bi-planar fluoroscopic views.  After negative aspirate for air, blood, and CSF, a 2 ml. volume of Isovue-250 was injected into the epidural space and the flow of contrast was observed. Radiographs were obtained for documentation purposes.    The injectate was administered into the level noted above.   Additional Comments:  The patient tolerated the procedure well Dressing: 2 x 2 sterile gauze and Band-Aid    Post-procedure details: Patient was observed during the procedure. Post-procedure instructions were reviewed.  Patient left the clinic in stable  condition.

## 2024-01-22 DIAGNOSIS — H43813 Vitreous degeneration, bilateral: Secondary | ICD-10-CM | POA: Diagnosis not present

## 2024-01-22 DIAGNOSIS — Z961 Presence of intraocular lens: Secondary | ICD-10-CM | POA: Diagnosis not present

## 2024-01-22 DIAGNOSIS — H04123 Dry eye syndrome of bilateral lacrimal glands: Secondary | ICD-10-CM | POA: Diagnosis not present

## 2024-01-23 ENCOUNTER — Ambulatory Visit: Payer: Medicare Other | Attending: Cardiovascular Disease | Admitting: Cardiovascular Disease

## 2024-01-23 ENCOUNTER — Encounter: Payer: Self-pay | Admitting: Cardiovascular Disease

## 2024-01-23 VITALS — BP 156/75 | HR 60 | Ht 67.0 in | Wt 149.0 lb

## 2024-01-23 DIAGNOSIS — I4891 Unspecified atrial fibrillation: Secondary | ICD-10-CM | POA: Diagnosis not present

## 2024-01-23 NOTE — Patient Instructions (Signed)
 Medication Instructions:  Continue all current medications.   Labwork: none  Testing/Procedures: none  Follow-Up: 6 months   Any Other Special Instructions Will Be Listed Below (If Applicable).   If you need a refill on your cardiac medications before your next appointment, please call your pharmacy.

## 2024-01-23 NOTE — Progress Notes (Signed)
   PCP: Zarwolo, Gloria, FNP   Primary EP: Dr Nancey  [name]  Lydia Graham is a 88 y.o. female who presents today for routine electrophysiology followup.    She was admitted to [facility] in August 2024 due to knee pain and a large hematoma after a fall.  During her hospitalization she was noted to have atrial fibrillation and had a 4-second pause.  This was not a postconversion pause.  Strips are available under the CV strips tab.  Beta-blocker was decreased.  Her amiodarone  100 mg daily was continued.   Today, she denies symptoms of palpitations, chest pain, shortness of breath,  lower extremity edema, dizziness, presyncope, or syncope.  The patient is otherwise without complaint today.   She has noticed for some tie now that she feels a little woozy or lightheaded in the morning when she first wakes up, particularly if she did not eat much the night before.  This sensation lasts until she has some food.  She has checked her blood pressure and her pulse rate during these times, and her BP typically is 110s to 120s over 70s, and her heart rate in the 60s.  She knows to avoid getting in the shower when she feels this way because hot water  can exacerbate her symptoms.   She has been having increased episodes of atrial fibrillation, so her amiodarone  was increased to 100 mg daily from 100 mg every other day.  This seems to have helped.  She is having episodes now maybe once a month, and they are brief.  She has been taking amiodarone  and metoprolol  as needed to help relieve episodes.    Physical Exam: Vitals:   01/23/24 1306  BP: (!) 156/75  Pulse: 60  SpO2: 95%  Weight: 149 lb (67.6 kg)  Height: 5' 7 (1.702 m)     Gen: Appears comfortable, well-nourished CV: RRR, no dependent edema Pulm: breathing easily   Wt Readings from Last 3 Encounters:  01/23/24 149 lb (67.6 kg)  01/09/24 149 lb 6.4 oz (67.8 kg)  11/13/23 148 lb 12.8 oz (67.5 kg)    EKG tracing ordered today is personally  reviewed and shows sinus with LBBB  Assessment and Plan:  Persistent atrial fibrillation Doing well with amiodarone  100mg  daily  She is on eliquis  5mg  for stroke prevention (chads2vasc score is 5) TSH/CMP 12/2023 are ok We discussed signs and symptoms that would require her to go to the ER or call us  for expedited support. If she has increased frequency/duration of episodes, I would increase amiodarone  to 200mg  daily. I advised her against taking additional amiodarone  as needed as it would not likely be effective as a.  Medication.  2. HTN Stable No change required today   3. Chronic diastolic dysfunction Stable No change required today  4. S/p prior CEA for L ICA stenosis in 2018 Stable No change required today Follows with Vascular surgery  5. Moderate MR Follows with Dr Alvan His notes reviewed MR mild by echo 03/28/22 (reviewed)  Risks, benefits and potential toxicities for medications prescribed and/or refilled reviewed with patient today.   Return in a year to see me Follow-up with Dr Alvan in the interim  Eulas FORBES Nancey, MD 01/23/2024 1:25 PM

## 2024-02-14 ENCOUNTER — Other Ambulatory Visit: Payer: Self-pay | Admitting: Cardiology

## 2024-03-01 ENCOUNTER — Other Ambulatory Visit: Payer: Self-pay | Admitting: Cardiology

## 2024-03-16 ENCOUNTER — Other Ambulatory Visit (HOSPITAL_COMMUNITY): Payer: Self-pay

## 2024-03-16 ENCOUNTER — Telehealth: Payer: Self-pay | Admitting: Pharmacy Technician

## 2024-03-16 NOTE — Telephone Encounter (Signed)
    I called [facility] and they put in a override and it went

## 2024-04-26 ENCOUNTER — Encounter: Payer: Self-pay | Admitting: Radiology

## 2024-05-02 ENCOUNTER — Other Ambulatory Visit: Payer: Self-pay | Admitting: Cardiology

## 2024-05-07 DIAGNOSIS — Z1231 Encounter for screening mammogram for malignant neoplasm of breast: Secondary | ICD-10-CM | POA: Diagnosis not present

## 2024-05-13 ENCOUNTER — Encounter: Payer: Self-pay | Admitting: Family Medicine

## 2024-05-13 ENCOUNTER — Ambulatory Visit: Admitting: Family Medicine

## 2024-05-13 VITALS — BP 155/74 | HR 64 | Ht 67.0 in | Wt 149.0 lb

## 2024-05-13 DIAGNOSIS — R7301 Impaired fasting glucose: Secondary | ICD-10-CM | POA: Diagnosis not present

## 2024-05-13 DIAGNOSIS — E782 Mixed hyperlipidemia: Secondary | ICD-10-CM

## 2024-05-13 DIAGNOSIS — E038 Other specified hypothyroidism: Secondary | ICD-10-CM

## 2024-05-13 DIAGNOSIS — I10 Essential (primary) hypertension: Secondary | ICD-10-CM

## 2024-05-13 DIAGNOSIS — K219 Gastro-esophageal reflux disease without esophagitis: Secondary | ICD-10-CM

## 2024-05-13 DIAGNOSIS — E559 Vitamin D deficiency, unspecified: Secondary | ICD-10-CM

## 2024-05-13 NOTE — Assessment & Plan Note (Signed)
 Controlled BP today in the clinic Encouraged to continue current treatment regimen as is A low-sodium diet and increased physical activity were also recommended.

## 2024-05-13 NOTE — Assessment & Plan Note (Signed)
 Encouraged treatment regimen as is  GERD diet encouraged

## 2024-05-13 NOTE — Progress Notes (Signed)
 Established Patient Office Visit  Subjective:  Patient ID: Lydia  SHEYENNE Graham, female    DOB: June 25, 1935  Age: 88 y.o. MRN: [id number]  CC:  Chief Complaint  Patient presents with   Hypertension    Four month follow up    HPI Lydia  H Graham is a 88 y.o. female with past medical history of HTN, GERD, Hyperlipidemia presents for f/u of  chronic medical conditions.  For the details of today's visit, please refer to the assessment and plan.     Past Medical History:  Diagnosis Date   Arthritis    oa, all over - multiple areas    Carotid artery occlusion    Dysrhythmia    AFib    GERD (gastroesophageal reflux disease)    Headache    h/o migraines    Hypertension    PAF (paroxysmal atrial fibrillation) (HCC)    a. multiple DCCV's in 2019 and eventually required initiation of Amiodarone  with successful DCCV after this. b. recurrent in 12/2021 while admitted for Urosepsis    Past Surgical History:  Procedure Laterality Date   ABDOMINAL HYSTERECTOMY     APPENDECTOMY     BACK SURGERY     CARDIOVERSION N/A 12/19/2017   Procedure: CARDIOVERSION;  Surgeon: Alvan Dorn FALCON, MD;  Location: [facility] ENDO SUITE;  Service: Endoscopy;  Laterality: N/A;   CARDIOVERSION N/A 12/26/2017   Procedure: CARDIOVERSION;  Surgeon: Mona Vinie BROCKS, MD;  Location: [facility] ENDOSCOPY;  Service: Cardiovascular;  Laterality: N/A;   CARDIOVERSION N/A 01/26/2018   Procedure: CARDIOVERSION;  Surgeon: Jeffrie Oneil BROCKS, MD;  Location: [facility] ENDOSCOPY;  Service: Cardiovascular;  Laterality: N/A;   CAROTID ENDARTERECTOMY     CATARACT EXTRACTION W/PHACO Right 03/09/2021   Procedure: CATARACT EXTRACTION PHACO AND INTRAOCULAR LENS PLACEMENT with Placement of Corticosteroid (IOC);  Surgeon: Harrie Agent, MD;  Location: [facility] ORS;  Service: Ophthalmology;  Laterality: Right;  CDE 9.16   CATARACT EXTRACTION W/PHACO Left 04/02/2021   Procedure: CATARACT EXTRACTION PHACO AND INTRAOCULAR LENS PLACEMENT LEFT EYE;  Surgeon: Harrie Agent, MD;  Location: [facility] ORS;  Service: Ophthalmology;  Laterality: Left;  left CDE=6.75   ENDARTERECTOMY Left 03/27/2017   Procedure: ENDARTERECTOMY CAROTID LEFT;  Surgeon: Sheree Penne Bruckner, MD;  Location: [facility] OR;  Service: Vascular;  Laterality: Left;   INNER EAR SURGERY Right    puntured eardrum- repaired 2x's    KNEE ARTHROPLASTY     NASAL SINUS SURGERY     deviated septum   PATCH ANGIOPLASTY Left 03/27/2017   Procedure: PATCH ANGIOPLASTY LEFT CAROTID ARTERY USING GEORGE BIOLOGIC PATCH;  Surgeon: Sheree Penne Bruckner, MD;  Location: [facility] OR;  Service: Vascular;  Laterality: Left;   TONSILLECTOMY     TOTAL KNEE ARTHROPLASTY Right 10/23/2016   Procedure: RIGHT TOTAL KNEE ARTHROPLASTY;  Surgeon: Harden Jerona GAILS, MD;  Location: [facility] OR;  Service: Orthopedics;  Laterality: Right;    Family History  Problem Relation Age of Onset   Stroke Mother    Rheum arthritis Sister     Social History   Socioeconomic History   Marital status: Widowed    Spouse name: Not on file   Number of children: Not on file   Years of education: Not on file   Highest education level: Not on file  Occupational History   Not on file  Tobacco Use   Smoking status: Never   Smokeless tobacco: Never  Vaping Use   Vaping status: Never Used  Substance and Sexual Activity   Alcohol  use: No  Drug use: No   Sexual activity: Not Currently    Birth control/protection: Surgical  Other Topics Concern   Not on file  Social History Narrative   Not on file   Social Drivers of Health   Financial Resource Strain: Low Risk  (06/28/2022)   Overall Financial Resource Strain (CARDIA)    Difficulty of Paying Living Expenses: Not hard at all  Food Insecurity: No Food Insecurity (07/30/2023)   Hunger Vital Sign    Worried About Running Out of Food in the Last Year: Never true    Ran Out of Food in the Last Year: Never true  Transportation Needs: No Transportation Needs (07/30/2023)   PRAPARE - Therapist, Art (Medical): No    Lack of Transportation (Non-Medical): No  Physical Activity: Sufficiently Active (06/28/2022)   Exercise Vital Sign    Days of Exercise per Week: 3 days    Minutes of Exercise per Session: 50 min  Stress: No Stress Concern Present (06/28/2022)   [employer] of Occupational Health - Occupational Stress Questionnaire    Feeling of Stress : Not at all  Social Connections: Moderately Integrated (06/28/2022)   Social Connection and Isolation Panel    Frequency of Communication with Friends and Family: More than three times a week    Frequency of Social Gatherings with Friends and Family: More than three times a week    Attends Religious Services: More than 4 times per year    Active Member of [employer] or Organizations: Yes    Attends Banker Meetings: More than 4 times per year    Marital Status: Widowed  Intimate Partner Violence: Not At Risk (07/30/2023)   Humiliation, Afraid, Rape, and Kick questionnaire    Fear of Current or Ex-Partner: No    Emotionally Abused: No    Physically Abused: No    Sexually Abused: No    Outpatient Medications Prior to Visit  Medication Sig Dispense Refill   amiodarone  (PACERONE ) 200 MG tablet TAKE 1/2 TABLET BY MOUTH DAILY 45 tablet 3   amLODipine  (NORVASC ) 5 MG tablet TAKE 1 TABLET BY MOUTH AT 8 am AND TAKE 1 TABLET BY MOUTH AT 8 pm 180 tablet 1   baclofen  (LIORESAL ) 10 MG tablet Take 1 tablet (10 mg total) by mouth at bedtime as needed for muscle spasms. 30 each 0   Calcium  Carb-Cholecalciferol (CALCIUM  + VITAMIN D3 PO) Take 1 tablet by mouth daily.     Camphor-Menthol -Methyl Sal (SALONPAS) 3.06-29-08 % PTCH Place 1 patch onto the skin at bedtime as needed (foot pain).     Cyanocobalamin  (VITAMIN B-12 PO) Take 1 tablet by mouth daily.     ELIQUIS  5 MG TABS tablet TAKE 1 TABLET BY MOUTH TWICE DAILY 240 tablet 2   estradiol  (ESTRACE ) 0.5 MG tablet Take 0.5 mg by mouth daily.      Hypromellose (ARTIFICIAL  TEARS OP) Place 2 drops into both eyes 2 (two) times daily as needed (for dry eyes).     losartan  (COZAAR ) 50 MG tablet TAKE 1 TABLET BY MOUTH AT 10 am AND TAKE 1 TABLET BY MOUTH AT 10 pm 180 tablet 2   MAGNESIUM  PO Take 1 capsule by mouth at bedtime as needed (cramping).     metoprolol  succinate (TOPROL -XL) 25 MG 24 hr tablet TAKE 1 TABLET BY MOUTH DAILY 30 tablet 6   pantoprazole  (PROTONIX ) 40 MG tablet TAKE 1 TABLET BY MOUTH TWICE DAILY 180 tablet 3   UNABLE TO  FIND Med Name: Mediven Comfort calf medical compression stocking 30-40 ICD: I83.893 1 each 1   No facility-administered medications prior to visit.    Allergies  Allergen Reactions   Crestor [Rosuvastatin] Other (See Comments)    Myalgias   Other Other (See Comments)    Hypotension caused by pain medication/anesthesia   Singulair [Montelukast Sodium] Other (See Comments)    Fatigue    Zestril [Lisinopril] Cough    ROS Review of Systems  Constitutional:  Negative for chills and fever.  Eyes:  Negative for visual disturbance.  Respiratory:  Negative for chest tightness and shortness of breath.   Neurological:  Negative for dizziness and headaches.      Objective:    Physical Exam HENT:     Head: Normocephalic.     Mouth/Throat:     Mouth: Mucous membranes are moist.  Cardiovascular:     Rate and Rhythm: Normal rate.     Heart sounds: Normal heart sounds.  Pulmonary:     Effort: Pulmonary effort is normal.     Breath sounds: Normal breath sounds.  Neurological:     Mental Status: She is alert.     BP (!) 155/74   Pulse 64   Ht 5' 7 (1.702 m)   Wt 149 lb (67.6 kg)   LMP  (LMP Unknown)   SpO2 95%   BMI 23.34 kg/m  Wt Readings from Last 3 Encounters:  05/13/24 149 lb (67.6 kg)  01/23/24 149 lb (67.6 kg)  01/09/24 149 lb 6.4 oz (67.8 kg)    Lab Results  Component Value Date   TSH 2.560 01/12/2024   Lab Results  Component Value Date   WBC 6.4 01/12/2024   HGB 13.9 01/12/2024   HCT 43.9  01/12/2024   MCV 95 01/12/2024   PLT 326 01/12/2024   Lab Results  Component Value Date   NA 138 01/12/2024   K 4.5 01/12/2024   CO2 16 (L) 01/12/2024   GLUCOSE 87 01/12/2024   BUN 20 01/12/2024   CREATININE 1.10 (H) 01/12/2024   BILITOT 0.9 01/12/2024   ALKPHOS 90 01/12/2024   AST 17 01/12/2024   ALT 9 01/12/2024   PROT 6.8 01/12/2024   ALBUMIN  4.4 01/12/2024   CALCIUM  9.5 01/12/2024   ANIONGAP 9 05/30/2023   EGFR 49 (L) 01/12/2024   Lab Results  Component Value Date   CHOL 236 (H) 01/12/2024   Lab Results  Component Value Date   HDL 55 01/12/2024   Lab Results  Component Value Date   LDLCALC 142 (H) 01/12/2024   Lab Results  Component Value Date   TRIG 220 (H) 01/12/2024   Lab Results  Component Value Date   CHOLHDL 4.3 01/12/2024   Lab Results  Component Value Date   HGBA1C 5.1 01/12/2024      Assessment & Plan:  Essential hypertension Assessment & Plan: Controlled BP today in the clinic Encouraged to continue current treatment regimen as is A low-sodium diet and increased physical activity were also recommended.    Gastroesophageal reflux disease without esophagitis Assessment & Plan: Encouraged treatment regimen as is  GERD diet encouraged   IFG (impaired fasting glucose) -     Hemoglobin A1c  Vitamin D  deficiency -     VITAMIN D  25 Hydroxy (Vit-D Deficiency, Fractures)  TSH (thyroid -stimulating hormone deficiency) -     TSH + free T4  Mixed hyperlipidemia -     Lipid panel -     CMP14+EGFR -  CBC with Differential/Platelet  Note: This chart has been completed using Engineer, Civil (consulting) software, and while attempts have been made to ensure accuracy, certain words and phrases may not be transcribed as intended.    Follow-up: Return in about 4 months (around 09/10/2024).   Claritza July  Z Bacchus, FNP

## 2024-05-13 NOTE — Patient Instructions (Signed)
 I appreciate the opportunity to provide care to you today!    Follow up:  4 months  Fasting Labs: please stop by the lab during the week to get your blood drawn (CBC, CMP, TSH, Lipid profile, HgA1c, Vit D)  For a Healthier YOU, I Recommend: Reducing your intake of sugar, sodium, carbohydrates, and saturated fats. Increasing your fiber intake by incorporating more whole grains, fruits, and vegetables into your meals. Setting healthy goals with a focus on lowering your consumption of carbs, sugar, and unhealthy fats. Adding variety to your diet by including a wide range of fruits and vegetables. Cutting back on soda and limiting processed foods as much as possible. Staying active: In addition to taking your weight loss medication, aim for at least 150 minutes of moderate-intensity physical activity each week for optimal results.    Please follow up if your symptoms worsen or fail to improve.    Please continue to a heart-healthy diet and increase your physical activities. Try to exercise for at least five days a week.    It was a pleasure to see you and I look forward to continuing to work together on your health and well-being. Please do not hesitate to call the office if you need care or have questions about your care.  In case of emergency, please visit the Emergency Department for urgent care, or contact our clinic at [phone] to schedule an appointment. We're here to help you!   Have a wonderful day and week. With Gratitude, Meade JENEANE Gerlach MSN, FNP-BC, PMHNP-BC

## 2024-05-17 DIAGNOSIS — E559 Vitamin D deficiency, unspecified: Secondary | ICD-10-CM | POA: Diagnosis not present

## 2024-05-17 DIAGNOSIS — E782 Mixed hyperlipidemia: Secondary | ICD-10-CM | POA: Diagnosis not present

## 2024-05-17 DIAGNOSIS — E038 Other specified hypothyroidism: Secondary | ICD-10-CM | POA: Diagnosis not present

## 2024-05-17 DIAGNOSIS — R7301 Impaired fasting glucose: Secondary | ICD-10-CM | POA: Diagnosis not present

## 2024-05-18 LAB — CBC WITH DIFFERENTIAL/PLATELET
Basophils Absolute: 0.1 x10E3/uL (ref 0.0–0.2)
Basos: 1 %
EOS (ABSOLUTE): 0.4 x10E3/uL (ref 0.0–0.4)
Eos: 7 %
Hematocrit: 45.6 % (ref 34.0–46.6)
Hemoglobin: 14.4 g/dL (ref 11.1–15.9)
Immature Grans (Abs): 0 x10E3/uL (ref 0.0–0.1)
Immature Granulocytes: 0 %
Lymphocytes Absolute: 1.8 x10E3/uL (ref 0.7–3.1)
Lymphs: 28 %
MCH: 30.9 pg (ref 26.6–33.0)
MCHC: 31.6 g/dL (ref 31.5–35.7)
MCV: 98 fL — ABNORMAL HIGH (ref 79–97)
Monocytes Absolute: 0.5 x10E3/uL (ref 0.1–0.9)
Monocytes: 7 %
Neutrophils Absolute: 3.5 x10E3/uL (ref 1.4–7.0)
Neutrophils: 57 %
Platelets: 304 x10E3/uL (ref 150–450)
RBC: 4.66 x10E6/uL (ref 3.77–5.28)
RDW: 12.7 % (ref 11.7–15.4)
WBC: 6.3 x10E3/uL (ref 3.4–10.8)

## 2024-05-18 LAB — TSH+FREE T4
Free T4: 1.22 ng/dL (ref 0.82–1.77)
TSH: 2.93 u[IU]/mL (ref 0.450–4.500)

## 2024-05-18 LAB — CMP14+EGFR
ALT: 13 IU/L (ref 0–32)
AST: 23 IU/L (ref 0–40)
Albumin: 4.5 g/dL (ref 3.7–4.7)
Alkaline Phosphatase: 82 IU/L (ref 48–129)
BUN/Creatinine Ratio: 16 (ref 12–28)
BUN: 16 mg/dL (ref 8–27)
Bilirubin Total: 0.9 mg/dL (ref 0.0–1.2)
CO2: 22 mmol/L (ref 20–29)
Calcium: 9.8 mg/dL (ref 8.7–10.3)
Chloride: 101 mmol/L (ref 96–106)
Creatinine, Ser: 1.01 mg/dL — ABNORMAL HIGH (ref 0.57–1.00)
Globulin, Total: 2.3 g/dL (ref 1.5–4.5)
Glucose: 91 mg/dL (ref 70–99)
Potassium: 4.6 mmol/L (ref 3.5–5.2)
Sodium: 136 mmol/L (ref 134–144)
Total Protein: 6.8 g/dL (ref 6.0–8.5)
eGFR: 54 mL/min/1.73 — ABNORMAL LOW (ref >59)

## 2024-05-18 LAB — LIPID PANEL
Chol/HDL Ratio: 4.2 ratio (ref 0.0–4.4)
Cholesterol, Total: 250 mg/dL — ABNORMAL HIGH (ref 100–199)
HDL: 59 mg/dL (ref >39)
LDL Chol Calc (NIH): 165 mg/dL — ABNORMAL HIGH (ref 0–99)
Triglycerides: 147 mg/dL (ref 0–149)
VLDL Cholesterol Cal: 26 mg/dL (ref 5–40)

## 2024-05-18 LAB — HEMOGLOBIN A1C
Est. average glucose Bld gHb Est-mCnc: 103 mg/dL
Hgb A1c MFr Bld: 5.2 % (ref 4.8–5.6)

## 2024-05-18 LAB — VITAMIN D 25 HYDROXY (VIT D DEFICIENCY, FRACTURES): Vit D, 25-Hydroxy: 41.4 ng/mL (ref 30.0–100.0)

## 2024-05-23 ENCOUNTER — Ambulatory Visit: Payer: Self-pay | Admitting: Family Medicine

## 2024-06-04 ENCOUNTER — Other Ambulatory Visit: Payer: Self-pay | Admitting: Cardiology

## 2024-06-21 ENCOUNTER — Ambulatory Visit: Admitting: Orthopedic Surgery

## 2024-06-21 ENCOUNTER — Other Ambulatory Visit (INDEPENDENT_AMBULATORY_CARE_PROVIDER_SITE_OTHER): Payer: Self-pay

## 2024-06-21 DIAGNOSIS — M25562 Pain in left knee: Secondary | ICD-10-CM

## 2024-06-21 MED ORDER — METHYLPREDNISOLONE 4 MG PO TBPK: ORAL_TABLET | ORAL | 0 refills | Status: AC

## 2024-06-21 NOTE — Progress Notes (Signed)
 Orthopedic Surgery Progress Note   Assessment: Patient is a 88 y.o. female who comes in with left knee pain with the sensation of locking/clicking in the knee that is consistent with osteoarthritis. She also has noted low back pain and left posterior hip pain recently   Plan: -Talked about options for her knee osteoarthritis: PT, quad strengthening, tylenol /ibuprofen, oral steroids, intra articular steroids, knee replacement. I would not recommend knee replacement at this time since she has not tried any conservative treatments. After this conversation, patient wanted to try an intraarticular injection which was done today in the office - see procedure note below. She also wanted to try oral steroids which were prescribed to her today  -She can take tylenol  up to 1000mg  TID -Told her that we could do injections up to every 3 months if they are effective -Will see her back in 3 months. Told her to come back in sooner if her pain does not get better with this treatment or she develops new symptoms   Left knee intraarticular injection: After discussing the risks, benefits, and alternatives of left knee intraarticular injection, patient elected to proceed. The patient was in the seated position with the knee at 90 degrees. The anterolateral soft spot over the knee was prepped with an alcohol  based prep. Ethyl chloride was used to anesthetize the skin. A 20 gauge needle was used to inject 1cc of lidocaine , 1cc of bupivacaine , 1cc of betamethasone  into the intraarticular space under standard sterile technique. Needle was withdrawn and band aid applied. Patient tolerated the procedure well.  ___________________________________________________________________________  Subjective: Patient has noted acute onset of left knee pain. She said it started about 3 weeks ago. It feels like it will lock and click at times. There was no trauma or injury that preceded the onset of the pain. She has been using a cane. She  was using this before the knee pain. The pain is felt mostly over the medial aspect of the knee but she also feels it over the lateral aspect of the knee. She has not tried any treatments so far except periodic tylenol .  Within the last week, she has noted onset of low back and left buttock pain. She thinks because of her knee pain she has been compensating and this started her back pain. She feels it in the low back and in the left buttock. It does not radiate further down the leg. No saddle anesthesia. No radiating right leg pain. No bowel or bladder incontinence.    Physical Exam:  General: no acute distress, appears stated age Neurologic: alert, answering questions appropriately, following commands Respiratory: unlabored breathing on room air, symmetric chest rise Psychiatric: appropriate affect, normal cadence to speech  MSK:   -Right lower extremity  No pain with log roll, negative stinchfield, negative faber  No pain through passive range of motion at the knee, pain with valgus stress, knee stable to varus and valgus stress. Knee ROM from 0-100. Negative lachman. Negative posterior drawer EHL/TA/GSC intact Plantarflexes and dorsiflexes toes Sensation intact to light touch in sural, saphenous, tibial, deep peroneal, and superficial peroneal nerve distributions Foot warm and well perfused   Imaging:  XRs of the right knee from 06/21/2024 were independently reviewed and interpreted, showing joint space narrowing in all three compartments. The most significant joint space narrowing seen in the lateral compartment. Subchondral sclerosis seen in the lateral compartment. Valgus alignment at the knee. No fracture or dislocation seen.    Patient name: Lydia  ALEYZA Graham Patient MRN:  [id number] Date: 06/21/2024

## 2024-06-30 ENCOUNTER — Telehealth: Payer: Self-pay | Admitting: Cardiology

## 2024-06-30 NOTE — Telephone Encounter (Signed)
 Spoke with pt who reports Afib with elevated HR 06/29/24.  Pt reports she took Amiodarone  200mg  and an extra Metoprolol  Succinate 25mg  and HR returned to normal.  Pt states HR today is 51.  She denies current CP, SOB or dizziness.  Pt is due to see Dr Nancey.  Appointment scheduled for 07/16/2024 in the [place] office with Dr Nancey. Pt advised to continue to monitor HR and take current medications.  Reviewed ED precautions.  Pt verbalizes understanding and thanked CHARITY FUNDRAISER for the call.

## 2024-06-30 NOTE — Telephone Encounter (Signed)
 STAT if HR is under 50 or over 120  (normal HR is 60-100 beats per minute)  What is your heart rate? 51  Do you have a log of your heart rate readings (document readings)? Monday: 151 Yesterday:125  Do you have any other symptoms? No

## 2024-07-12 ENCOUNTER — Encounter: Payer: Self-pay | Admitting: Cardiology

## 2024-07-12 ENCOUNTER — Ambulatory Visit: Attending: Cardiology | Admitting: Cardiology

## 2024-07-12 VITALS — BP 140/80 | HR 60 | Ht 67.0 in | Wt 146.4 lb

## 2024-07-12 DIAGNOSIS — D6869 Other thrombophilia: Secondary | ICD-10-CM | POA: Diagnosis not present

## 2024-07-12 DIAGNOSIS — I4891 Unspecified atrial fibrillation: Secondary | ICD-10-CM | POA: Diagnosis not present

## 2024-07-12 DIAGNOSIS — I10 Essential (primary) hypertension: Secondary | ICD-10-CM | POA: Diagnosis not present

## 2024-07-12 DIAGNOSIS — I340 Nonrheumatic mitral (valve) insufficiency: Secondary | ICD-10-CM | POA: Diagnosis present

## 2024-07-12 MED ORDER — AMIODARONE HCL 200 MG PO TABS: 200.0000 mg | ORAL_TABLET | Freq: Every day | ORAL | 3 refills | Status: AC

## 2024-07-12 NOTE — Patient Instructions (Signed)
 Medication Instructions:  Your physician has recommended you make the following change in your medication:   -Increase Amiodarone  to 200 mg once daily   *If you need a refill on your cardiac medications before your next appointment, please call your pharmacy*  Lab Work: None If you have labs (blood work) drawn today and your tests are completely normal, you will receive your results only by: MyChart Message (if you have MyChart) OR A paper copy in the mail If you have any lab test that is abnormal or we need to change your treatment, we will call you to review the results.  Testing/Procedures: None  Follow-Up: At [facility], you and your health needs are our priority.  As part of our continuing mission to provide you with exceptional heart care, our providers are all part of one team.  This team includes your primary Cardiologist (physician) and Advanced Practice Providers or APPs (Physician Assistants and Nurse Practitioners) who all work together to provide you with the care you need, when you need it.  Your next appointment:   6 month(s)  Provider:   You may see Alvan Carrier, MD or one of the following Advanced Practice Providers on your designated Care Team:   Laymon Qua, PA-C  [place], [place] Olivia [place], [place]     We recommend signing up for the patient portal called MyChart.  Sign up information is provided on this After Visit Summary.  MyChart is used to connect with patients for Virtual Visits (Telemedicine).  Patients are able to view lab/test results, encounter notes, upcoming appointments, etc.  Non-urgent messages can be sent to your provider as well.   To learn more about what you can do with MyChart, go to [email or web address].   Other Instructions Nurse Visit- 2 Weeks- BP Check

## 2024-07-12 NOTE — Progress Notes (Signed)
 "     Clinical Summary Ms. Below is a 89 y.o.female seen today for follow up of the following medical problems.    1.Afib - followed by EP Dr Nancey - has been on amio 100mg  qday     04/01/23 ER visit with palpitations - home HRs reported at 170, by ER evaluation was already back in SR - K 4.1, ER EKG showed SR LBBB - felt heart go back into rhythm while in ER    - palpitations every 3-4 days, last about 1 hour - notes high heart rates at times 130s to 150s at times - compliant withmeds - recent medrol  pack x 6 days     2.History of ystolic dysfunction 12/2021 echo LVEF 45-50%, no MR. This was in setting of admission with sepsis, afib with RVR 03/2022 echo LVEF 55-60%, no WMAs, grade II dd   - denies any SOB     3. HTN - compliant with meds - lightheaded with lower bp's, 110s/60s felt like was going   - home bp's 140s-160s/60s-80s - she reports dizzy when SBP in 120s     4. Bilateral LE edema - no recent issues   5. Carotid stenosis - prior CEA - 05/2021 study 1-39% bilateral disease 05/2022 RICA 1-39%, LICA 1-39% - followed by vascular, last appt Jan 2025.  - Jan 2025 1-39% bilateral stenosis.    6. Mitral regurgitation - moderate by echo 2019 12/2021 echo LVEF 45-50%, no MR - 03/2022 echo LVEF 55-60%, mild MR   7. HLD - reports intolerance to medications. Past Medical History:  Diagnosis Date   Arthritis    oa, all over - multiple areas    Carotid artery occlusion    Dysrhythmia    AFib    GERD (gastroesophageal reflux disease)    Headache    h/o migraines    Hypertension    PAF (paroxysmal atrial fibrillation) (HCC)    a. multiple DCCV's in 2019 and eventually required initiation of Amiodarone  with successful DCCV after this. b. recurrent in 12/2021 while admitted for Urosepsis     Allergies[1]   Current Outpatient Medications  Medication Sig Dispense Refill   amiodarone  (PACERONE ) 200 MG tablet TAKE 1/2 TABLET BY MOUTH DAILY 45 tablet 3    amLODipine  (NORVASC ) 5 MG tablet TAKE 1 TABLET BY MOUTH AT 8 am AND TAKE 1 TABLET BY MOUTH AT 8 pm 60 tablet 0   baclofen  (LIORESAL ) 10 MG tablet Take 1 tablet (10 mg total) by mouth at bedtime as needed for muscle spasms. 30 each 0   Calcium  Carb-Cholecalciferol (CALCIUM  + VITAMIN D3 PO) Take 1 tablet by mouth daily.     Camphor-Menthol -Methyl Sal (SALONPAS) 3.06-29-08 % PTCH Place 1 patch onto the skin at bedtime as needed (foot pain).     Cyanocobalamin  (VITAMIN B-12 PO) Take 1 tablet by mouth daily.     ELIQUIS  5 MG TABS tablet TAKE 1 TABLET BY MOUTH TWICE DAILY 240 tablet 2   estradiol  (ESTRACE ) 0.5 MG tablet Take 0.5 mg by mouth daily.      Hypromellose (ARTIFICIAL TEARS OP) Place 2 drops into both eyes 2 (two) times daily as needed (for dry eyes).     losartan  (COZAAR ) 50 MG tablet TAKE 1 TABLET BY MOUTH AT 10 am AND TAKE 1 TABLET BY MOUTH AT 10 pm 180 tablet 2   MAGNESIUM  PO Take 1 capsule by mouth at bedtime as needed (cramping).     methylPREDNISolone  (MEDROL  DOSEPAK) 4 MG TBPK tablet  Take as prescribed on the box 21 tablet 0   metoprolol  succinate (TOPROL -XL) 25 MG 24 hr tablet TAKE 1 TABLET BY MOUTH DAILY 30 tablet 6   pantoprazole  (PROTONIX ) 40 MG tablet TAKE 1 TABLET BY MOUTH TWICE DAILY 180 tablet 3   UNABLE TO FIND Med Name: Mediven Comfort calf medical compression stocking 30-40 ICD: I83.893 1 each 1   No current facility-administered medications for this visit.     Past Surgical History:  Procedure Laterality Date   ABDOMINAL HYSTERECTOMY     APPENDECTOMY     BACK SURGERY     CARDIOVERSION N/A 12/19/2017   Procedure: CARDIOVERSION;  Surgeon: Alvan Dorn FALCON, MD;  Location: [facility];  Service: Endoscopy;  Laterality: N/A;   CARDIOVERSION N/A 12/26/2017   Procedure: CARDIOVERSION;  Surgeon: Mona Vinie BROCKS, MD;  Location: [facility] ENDOSCOPY;  Service: Cardiovascular;  Laterality: N/A;   CARDIOVERSION N/A 01/26/2018   Procedure: CARDIOVERSION;  Surgeon: Jeffrie Oneil BROCKS, MD;   Location: [facility] ENDOSCOPY;  Service: Cardiovascular;  Laterality: N/A;   CAROTID ENDARTERECTOMY     CATARACT EXTRACTION W/PHACO Right 03/09/2021   Procedure: CATARACT EXTRACTION PHACO AND INTRAOCULAR LENS PLACEMENT with Placement of Corticosteroid (IOC);  Surgeon: Harrie Agent, MD;  Location: [facility] ORS;  Service: Ophthalmology;  Laterality: Right;  CDE 9.16   CATARACT EXTRACTION W/PHACO Left 04/02/2021   Procedure: CATARACT EXTRACTION PHACO AND INTRAOCULAR LENS PLACEMENT LEFT EYE;  Surgeon: Harrie Agent, MD;  Location: [facility] ORS;  Service: Ophthalmology;  Laterality: Left;  left CDE=6.75   ENDARTERECTOMY Left 03/27/2017   Procedure: ENDARTERECTOMY CAROTID LEFT;  Surgeon: Sheree Penne Bruckner, MD;  Location: [facility] OR;  Service: Vascular;  Laterality: Left;   INNER EAR SURGERY Right    puntured eardrum- repaired 2x's    KNEE ARTHROPLASTY     NASAL SINUS SURGERY     deviated septum   PATCH ANGIOPLASTY Left 03/27/2017   Procedure: PATCH ANGIOPLASTY LEFT CAROTID ARTERY USING GEORGE BIOLOGIC PATCH;  Surgeon: Sheree Penne Bruckner, MD;  Location: [facility] OR;  Service: Vascular;  Laterality: Left;   TONSILLECTOMY     TOTAL KNEE ARTHROPLASTY Right 10/23/2016   Procedure: RIGHT TOTAL KNEE ARTHROPLASTY;  Surgeon: Harden Jerona GAILS, MD;  Location: [facility] OR;  Service: Orthopedics;  Laterality: Right;     Allergies[2]    Family History  Problem Relation Age of Onset   Stroke Mother    Rheum arthritis Sister      Social History Lydia Graham reports that she has never smoked. She has never used smokeless tobacco. Lydia Graham reports no history of alcohol  use.     Physical Examination Today's Vitals   07/12/24 1049  BP: (!) 140/80  Pulse: 60  SpO2: 99%  Weight: 146 lb 6.4 oz (66.4 kg)  Height: 5' 7 (1.702 m)  PainSc: 0-No pain   Body mass index is 22.93 kg/m.  Gen: resting comfortably, no acute distress HEENT: no scleral icterus, pupils equal round and reactive, no palptable cervical  adenopathy,  CV: RRR, no m/rg, no jvd Resp: Clear to auscultation bilaterally GI: abdomen is soft, non-tender, non-distended, normal bowel sounds, no hepatosplenomegaly MSK: extremities are warm, no edema.  Skin: warm, no rash Neuro:  no focal deficits Psych: appropriate affect   Diagnostic Studies  02/2021 echo Global longitudinal strain was attempted.  IMPRESSIONS     1. Left ventricular ejection fraction, by estimation, is 65 to 70%. The  left ventricle has normal function. The left ventricle has no regional  wall motion abnormalities.  Left ventricular diastolic parameters are  indeterminate.   2. Right ventricular systolic function is normal. The right ventricular  size is normal. There is mildly elevated pulmonary artery systolic  pressure.   3. Left atrial size was mildly dilated.   4. The mitral valve is normal in structure. Trivial mitral valve  regurgitation. No evidence of mitral stenosis.   5. The aortic valve is tricuspid. Aortic valve regurgitation is not  visualized. No aortic stenosis is present.   6. The inferior vena cava is normal in size with greater than 50%  respiratory variability, suggesting right atrial pressure of 3 mmHg.   Comparison(s): Previous Echo was technically challenging due to A-fib. LV  EF was @50 %, with moderate MR and TR, and biatrial enlargement.     12/2021 echo    1. Left ventricular ejection fraction, by estimation, is 45 to 50%. The  left ventricle has mildly decreased function. The left ventricle has no  regional wall motion abnormalities. There is mild left ventricular  hypertrophy. Left ventricular diastolic  parameters are indeterminate.   2. Right ventricular systolic function is normal. The right ventricular  size is normal. There is mildly elevated pulmonary artery systolic  pressure.   3. Left atrial size was mildly dilated.   4. Right atrial size was mildly dilated.   5. The mitral valve is normal in structure. No  evidence of mitral valve  regurgitation. No evidence of mitral stenosis.   6. The aortic valve is normal in structure. Aortic valve regurgitation is  not visualized. No aortic stenosis is present.   7. The inferior vena cava is normal in size with greater than 50%  respiratory variability, suggesting right atrial pressure of 3 mmHg.     05/2021 carotid US  Summary:  Right Carotid: Velocities in the right ICA are consistent with a 1-39%  stenosis.   Left Carotid: Velocities in the left ICA are consistent with a 1-39%  stenosis.   Vertebrals:  Bilateral vertebral arteries demonstrate antegrade flow.  Subclavians: Normal flow hemodynamics were seen in bilateral subclavian               arteries.      03/2022 echo 1. Left ventricular ejection fraction, by estimation, is 55 to 60%. The  left ventricle has normal function. The left ventricle has no regional  wall motion abnormalities. There is mild asymmetric left ventricular  hypertrophy of the basal segment. Left  ventricular diastolic parameters are consistent with Grade II diastolic  dysfunction (pseudonormalization). The average left ventricular global  longitudinal strain is -19.8 %. The global longitudinal strain is normal.   2. Right ventricular systolic function is normal. The right ventricular  size is normal. There is mildly elevated pulmonary artery systolic  pressure. The estimated right ventricular systolic pressure is 40.7 mmHg.   3. Left atrial size was mildly dilated.   4. The mitral valve is grossly normal. Mild mitral valve regurgitation.   5. Tricuspid valve regurgitation is mild to moderate.   6. The aortic valve is tricuspid. Aortic valve regurgitation is not  visualized. Aortic valve sclerosis is present, with no evidence of aortic  valve stenosis.   7. The inferior vena cava is normal in size with greater than 50%  respiratory variability, suggesting right atrial pressure of 3 mmHg.            Assessment  and Plan  Afib/acquired thrombophilia -recent palpitations, uncler if possibly related to recent medrol  pack though did complete this about 2  weeks ago - increase amio to 200mg  daily, has f/u with EP this week - EKG today shows SR with chronic LBBB   2. HTN  -elevated but typically symptomatic with SBP in 120s, we have been conservative with her bp targets - bp check 2 weeks, may need additional agent pending bp   3. Mitral regurgitation - mild by 2023 echo, likely repeat echo later this year        Dorn PHEBE Ross, M.D.     [1]  Allergies Allergen Reactions   Crestor [Rosuvastatin] Other (See Comments)    Myalgias   Other Other (See Comments)    Hypotension caused by pain medication/anesthesia   Singulair [Montelukast Sodium] Other (See Comments)    Fatigue    Zestril [Lisinopril] Cough  [2]  Allergies Allergen Reactions   Crestor [Rosuvastatin] Other (See Comments)    Myalgias   Other Other (See Comments)    Hypotension caused by pain medication/anesthesia   Singulair [Montelukast Sodium] Other (See Comments)    Fatigue    Zestril [Lisinopril] Cough   "

## 2024-07-13 ENCOUNTER — Other Ambulatory Visit: Payer: Self-pay | Admitting: Cardiology

## 2024-07-16 ENCOUNTER — Ambulatory Visit: Admitting: Cardiovascular Disease

## 2024-07-26 ENCOUNTER — Ambulatory Visit

## 2024-07-26 ENCOUNTER — Telehealth: Payer: Self-pay

## 2024-07-26 NOTE — Telephone Encounter (Signed)
 Copied from CRM #[id number]. Topic: Appointments - Appointment Cancel/Reschedule >> Jul 26, 2024  9:18 AM Everette C wrote: Patient/patient representative is calling to cancel or reschedule an appointment. Refer to attachments for appointment information.

## 2024-07-29 ENCOUNTER — Other Ambulatory Visit: Payer: Self-pay | Admitting: Vascular Surgery

## 2024-07-29 DIAGNOSIS — I6523 Occlusion and stenosis of bilateral carotid arteries: Secondary | ICD-10-CM

## 2024-07-30 ENCOUNTER — Encounter: Payer: Medicare Other | Admitting: Family Medicine

## 2024-07-30 ENCOUNTER — Ambulatory Visit

## 2024-08-04 ENCOUNTER — Ambulatory Visit

## 2024-08-25 ENCOUNTER — Ambulatory Visit (HOSPITAL_COMMUNITY)

## 2024-08-25 ENCOUNTER — Ambulatory Visit

## 2024-09-10 ENCOUNTER — Ambulatory Visit: Admitting: Family Medicine

## 2024-09-20 ENCOUNTER — Ambulatory Visit: Admitting: Orthopedic Surgery

## 2024-09-24 ENCOUNTER — Ambulatory Visit: Admitting: Cardiovascular Disease
